# Patient Record
Sex: Male | Born: 1935 | Race: White | Hispanic: No | Marital: Married | Smoking: Never smoker
Health system: Southern US, Community
[De-identification: ages and names within clinical notes are randomized; demographics above are authoritative.]

## PROBLEM LIST (undated history)

## (undated) DIAGNOSIS — I10 Essential (primary) hypertension: Secondary | ICD-10-CM

## (undated) DIAGNOSIS — J449 Chronic obstructive pulmonary disease, unspecified: Secondary | ICD-10-CM

## (undated) DIAGNOSIS — G4733 Obstructive sleep apnea (adult) (pediatric): Secondary | ICD-10-CM

## (undated) DIAGNOSIS — I4891 Unspecified atrial fibrillation: Secondary | ICD-10-CM

## (undated) DIAGNOSIS — I2510 Atherosclerotic heart disease of native coronary artery without angina pectoris: Secondary | ICD-10-CM

## (undated) DIAGNOSIS — F32A Depression, unspecified: Secondary | ICD-10-CM

## (undated) DIAGNOSIS — D509 Iron deficiency anemia, unspecified: Secondary | ICD-10-CM

## (undated) DIAGNOSIS — I480 Paroxysmal atrial fibrillation: Secondary | ICD-10-CM

## (undated) DIAGNOSIS — I499 Cardiac arrhythmia, unspecified: Secondary | ICD-10-CM

## (undated) DIAGNOSIS — I714 Abdominal aortic aneurysm, without rupture, unspecified: Secondary | ICD-10-CM

## (undated) DIAGNOSIS — N400 Enlarged prostate without lower urinary tract symptoms: Secondary | ICD-10-CM

## (undated) DIAGNOSIS — I493 Ventricular premature depolarization: Secondary | ICD-10-CM

## (undated) DIAGNOSIS — F329 Major depressive disorder, single episode, unspecified: Secondary | ICD-10-CM

## (undated) DIAGNOSIS — M1990 Unspecified osteoarthritis, unspecified site: Secondary | ICD-10-CM

## (undated) DIAGNOSIS — K219 Gastro-esophageal reflux disease without esophagitis: Secondary | ICD-10-CM

## (undated) DIAGNOSIS — J986 Disorders of diaphragm: Secondary | ICD-10-CM

## (undated) DIAGNOSIS — E669 Obesity, unspecified: Secondary | ICD-10-CM

## (undated) DIAGNOSIS — R3129 Other microscopic hematuria: Secondary | ICD-10-CM

## (undated) DIAGNOSIS — E7800 Pure hypercholesterolemia, unspecified: Secondary | ICD-10-CM

## (undated) DIAGNOSIS — R0600 Dyspnea, unspecified: Secondary | ICD-10-CM

## (undated) HISTORY — DX: Abdominal aortic aneurysm, without rupture, unspecified: I71.40

## (undated) HISTORY — DX: Disorders of diaphragm: J98.6

## (undated) HISTORY — DX: Obstructive sleep apnea (adult) (pediatric): G47.33

## (undated) HISTORY — DX: Other microscopic hematuria: R31.29

## (undated) HISTORY — DX: Obesity, unspecified: E66.9

## (undated) HISTORY — DX: Benign prostatic hyperplasia without lower urinary tract symptoms: N40.0

## (undated) HISTORY — DX: Essential (primary) hypertension: I10

## (undated) HISTORY — DX: Pure hypercholesterolemia, unspecified: E78.00

## (undated) HISTORY — DX: Paroxysmal atrial fibrillation: I48.0

## (undated) HISTORY — PX: COLONOSCOPY W/ POLYPECTOMY: SHX1380

## (undated) HISTORY — DX: Dyspnea, unspecified: R06.00

## (undated) HISTORY — PX: EYE SURGERY: SHX253

## (undated) HISTORY — DX: Ventricular premature depolarization: I49.3

## (undated) HISTORY — DX: Abdominal aortic aneurysm, without rupture: I71.4

---
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
## 1976-09-09 HISTORY — PX: APPENDECTOMY: SHX54

## 1988-09-09 HISTORY — PX: CHOLECYSTECTOMY: SHX55

## 2000-07-09 ENCOUNTER — Ambulatory Visit (HOSPITAL_COMMUNITY): Admission: RE | Admit: 2000-07-09 | Discharge: 2000-07-09 | Payer: Self-pay | Admitting: Internal Medicine

## 2000-08-08 ENCOUNTER — Encounter (INDEPENDENT_AMBULATORY_CARE_PROVIDER_SITE_OTHER): Payer: Self-pay | Admitting: Specialist

## 2000-08-08 ENCOUNTER — Ambulatory Visit (HOSPITAL_COMMUNITY): Admission: RE | Admit: 2000-08-08 | Discharge: 2000-08-08 | Payer: Self-pay | Admitting: Gastroenterology

## 2001-11-03 ENCOUNTER — Encounter: Admission: RE | Admit: 2001-11-03 | Discharge: 2001-11-03 | Payer: Self-pay | Admitting: Internal Medicine

## 2001-11-03 ENCOUNTER — Encounter (HOSPITAL_BASED_OUTPATIENT_CLINIC_OR_DEPARTMENT_OTHER): Payer: Self-pay | Admitting: Internal Medicine

## 2001-12-03 ENCOUNTER — Encounter: Admission: RE | Admit: 2001-12-03 | Discharge: 2001-12-03 | Payer: Self-pay | Admitting: Otolaryngology

## 2001-12-03 ENCOUNTER — Encounter: Payer: Self-pay | Admitting: Otolaryngology

## 2001-12-15 ENCOUNTER — Encounter (HOSPITAL_BASED_OUTPATIENT_CLINIC_OR_DEPARTMENT_OTHER): Payer: Self-pay | Admitting: Internal Medicine

## 2001-12-15 ENCOUNTER — Encounter: Admission: RE | Admit: 2001-12-15 | Discharge: 2001-12-15 | Payer: Self-pay | Admitting: Internal Medicine

## 2003-07-19 ENCOUNTER — Ambulatory Visit (HOSPITAL_COMMUNITY): Admission: RE | Admit: 2003-07-19 | Discharge: 2003-07-19 | Payer: Self-pay | Admitting: Gastroenterology

## 2003-07-19 ENCOUNTER — Encounter (INDEPENDENT_AMBULATORY_CARE_PROVIDER_SITE_OTHER): Payer: Self-pay

## 2004-03-15 ENCOUNTER — Emergency Department (HOSPITAL_COMMUNITY): Admission: EM | Admit: 2004-03-15 | Discharge: 2004-03-15 | Payer: Self-pay | Admitting: Emergency Medicine

## 2004-07-20 ENCOUNTER — Ambulatory Visit (HOSPITAL_COMMUNITY): Admission: RE | Admit: 2004-07-20 | Discharge: 2004-07-20 | Payer: Self-pay | Admitting: Urology

## 2005-10-11 ENCOUNTER — Encounter: Admission: RE | Admit: 2005-10-11 | Discharge: 2005-10-11 | Payer: Self-pay | Admitting: Internal Medicine

## 2007-10-22 ENCOUNTER — Ambulatory Visit: Admission: RE | Admit: 2007-10-22 | Discharge: 2007-10-22 | Payer: Self-pay | Admitting: Specialist

## 2007-10-22 ENCOUNTER — Ambulatory Visit: Payer: Self-pay | Admitting: Vascular Surgery

## 2007-10-22 ENCOUNTER — Encounter (INDEPENDENT_AMBULATORY_CARE_PROVIDER_SITE_OTHER): Payer: Self-pay | Admitting: Specialist

## 2007-11-25 ENCOUNTER — Encounter: Admission: RE | Admit: 2007-11-25 | Discharge: 2007-11-25 | Payer: Self-pay | Admitting: Specialist

## 2010-03-07 ENCOUNTER — Encounter: Payer: Self-pay | Admitting: Pulmonary Disease

## 2010-03-09 ENCOUNTER — Encounter: Payer: Self-pay | Admitting: Internal Medicine

## 2010-03-09 ENCOUNTER — Encounter: Payer: Self-pay | Admitting: Cardiology

## 2010-03-09 HISTORY — PX: US ECHOCARDIOGRAPHY: HXRAD669

## 2010-03-15 ENCOUNTER — Encounter: Payer: Self-pay | Admitting: Pulmonary Disease

## 2010-03-15 ENCOUNTER — Encounter: Payer: Self-pay | Admitting: Internal Medicine

## 2010-03-19 ENCOUNTER — Encounter: Payer: Self-pay | Admitting: Cardiology

## 2010-03-19 ENCOUNTER — Encounter: Payer: Self-pay | Admitting: Internal Medicine

## 2010-03-19 HISTORY — PX: CARDIOVASCULAR STRESS TEST: SHX262

## 2010-04-26 ENCOUNTER — Ambulatory Visit: Payer: Self-pay | Admitting: Cardiology

## 2010-04-26 ENCOUNTER — Encounter: Admission: RE | Admit: 2010-04-26 | Discharge: 2010-04-26 | Payer: Self-pay | Admitting: Cardiology

## 2010-04-30 ENCOUNTER — Inpatient Hospital Stay (HOSPITAL_BASED_OUTPATIENT_CLINIC_OR_DEPARTMENT_OTHER): Admission: RE | Admit: 2010-04-30 | Discharge: 2010-04-30 | Payer: Self-pay | Admitting: Cardiology

## 2010-04-30 ENCOUNTER — Ambulatory Visit: Payer: Self-pay | Admitting: Cardiology

## 2010-04-30 HISTORY — PX: CARDIAC CATHETERIZATION: SHX172

## 2010-05-01 ENCOUNTER — Encounter: Payer: Self-pay | Admitting: Internal Medicine

## 2010-05-01 ENCOUNTER — Ambulatory Visit: Payer: Self-pay | Admitting: Cardiology

## 2010-05-09 DIAGNOSIS — M109 Gout, unspecified: Secondary | ICD-10-CM | POA: Insufficient documentation

## 2010-05-09 DIAGNOSIS — I4729 Other ventricular tachycardia: Secondary | ICD-10-CM | POA: Insufficient documentation

## 2010-05-09 DIAGNOSIS — E669 Obesity, unspecified: Secondary | ICD-10-CM | POA: Insufficient documentation

## 2010-05-09 DIAGNOSIS — E7800 Pure hypercholesterolemia, unspecified: Secondary | ICD-10-CM | POA: Insufficient documentation

## 2010-05-09 DIAGNOSIS — G4733 Obstructive sleep apnea (adult) (pediatric): Secondary | ICD-10-CM | POA: Insufficient documentation

## 2010-05-09 DIAGNOSIS — I472 Ventricular tachycardia: Secondary | ICD-10-CM | POA: Insufficient documentation

## 2010-05-09 DIAGNOSIS — I10 Essential (primary) hypertension: Secondary | ICD-10-CM | POA: Insufficient documentation

## 2010-05-10 ENCOUNTER — Ambulatory Visit: Payer: Self-pay | Admitting: Pulmonary Disease

## 2010-05-10 DIAGNOSIS — G2589 Other specified extrapyramidal and movement disorders: Secondary | ICD-10-CM | POA: Insufficient documentation

## 2010-05-10 DIAGNOSIS — G4700 Insomnia, unspecified: Secondary | ICD-10-CM | POA: Insufficient documentation

## 2010-05-11 ENCOUNTER — Encounter (INDEPENDENT_AMBULATORY_CARE_PROVIDER_SITE_OTHER): Payer: Self-pay | Admitting: *Deleted

## 2010-05-11 ENCOUNTER — Ambulatory Visit: Payer: Self-pay | Admitting: Internal Medicine

## 2010-05-15 ENCOUNTER — Encounter: Payer: Self-pay | Admitting: Internal Medicine

## 2010-05-21 ENCOUNTER — Telehealth (INDEPENDENT_AMBULATORY_CARE_PROVIDER_SITE_OTHER): Payer: Self-pay | Admitting: *Deleted

## 2010-05-22 ENCOUNTER — Ambulatory Visit: Payer: Self-pay | Admitting: Internal Medicine

## 2010-05-22 LAB — CONVERTED CEMR LAB
BUN: 22 mg/dL (ref 6–23)
Basophils Absolute: 0 10*3/uL (ref 0.0–0.1)
Basophils Relative: 0.5 % (ref 0.0–3.0)
CO2: 30 meq/L (ref 19–32)
Calcium: 9 mg/dL (ref 8.4–10.5)
Chloride: 105 meq/L (ref 96–112)
Creatinine, Ser: 0.9 mg/dL (ref 0.4–1.5)
Eosinophils Absolute: 0 10*3/uL (ref 0.0–0.7)
Eosinophils Relative: 0.6 % (ref 0.0–5.0)
GFR calc non Af Amer: 91.06 mL/min (ref >60)
Glucose, Bld: 87 mg/dL (ref 70–99)
HCT: 40 % (ref 39.0–52.0)
Hemoglobin: 13.8 g/dL (ref 13.0–17.0)
INR: 1 (ref 0.8–1.0)
Lymphocytes Relative: 21.3 % (ref 12.0–46.0)
Lymphs Abs: 1.4 10*3/uL (ref 0.7–4.0)
MCHC: 34.4 g/dL (ref 30.0–36.0)
MCV: 95.4 fL (ref 78.0–100.0)
Monocytes Absolute: 0.5 10*3/uL (ref 0.1–1.0)
Monocytes Relative: 8.1 % (ref 3.0–12.0)
Neutro Abs: 4.7 10*3/uL (ref 1.4–7.7)
Neutrophils Relative %: 69.5 % (ref 43.0–77.0)
Platelets: 134 10*3/uL — ABNORMAL LOW (ref 150.0–400.0)
Potassium: 4.5 meq/L (ref 3.5–5.1)
Prothrombin Time: 11 s (ref 9.7–11.8)
RBC: 4.19 M/uL — ABNORMAL LOW (ref 4.22–5.81)
RDW: 14.3 % (ref 11.5–14.6)
Sodium: 143 meq/L (ref 135–145)
WBC: 6.7 10*3/uL (ref 4.5–10.5)
aPTT: 29.4 s — ABNORMAL HIGH (ref 21.7–28.8)

## 2010-05-29 ENCOUNTER — Ambulatory Visit (HOSPITAL_COMMUNITY): Admission: RE | Admit: 2010-05-29 | Discharge: 2010-05-30 | Payer: Self-pay | Admitting: Internal Medicine

## 2010-05-29 ENCOUNTER — Ambulatory Visit: Payer: Self-pay | Admitting: Internal Medicine

## 2010-05-29 HISTORY — PX: OTHER SURGICAL HISTORY: SHX169

## 2010-06-04 ENCOUNTER — Ambulatory Visit: Payer: Self-pay | Admitting: Cardiology

## 2010-06-04 ENCOUNTER — Encounter: Payer: Self-pay | Admitting: Internal Medicine

## 2010-06-13 ENCOUNTER — Telehealth: Payer: Self-pay | Admitting: Pulmonary Disease

## 2010-06-15 ENCOUNTER — Ambulatory Visit: Payer: Self-pay | Admitting: Cardiology

## 2010-06-27 ENCOUNTER — Ambulatory Visit: Payer: Self-pay | Admitting: Pulmonary Disease

## 2010-06-27 DIAGNOSIS — J986 Disorders of diaphragm: Secondary | ICD-10-CM | POA: Insufficient documentation

## 2010-07-02 ENCOUNTER — Ambulatory Visit: Payer: Self-pay | Admitting: Internal Medicine

## 2010-07-03 ENCOUNTER — Ambulatory Visit (HOSPITAL_COMMUNITY): Admission: RE | Admit: 2010-07-03 | Discharge: 2010-07-03 | Payer: Self-pay | Admitting: Pulmonary Disease

## 2010-07-03 ENCOUNTER — Encounter: Payer: Self-pay | Admitting: Pulmonary Disease

## 2010-08-09 ENCOUNTER — Ambulatory Visit: Payer: Self-pay | Admitting: Cardiology

## 2010-09-20 ENCOUNTER — Ambulatory Visit (HOSPITAL_COMMUNITY)
Admission: RE | Admit: 2010-09-20 | Discharge: 2010-09-20 | Payer: Self-pay | Source: Home / Self Care | Attending: Orthopaedic Surgery | Admitting: Orthopaedic Surgery

## 2010-10-08 ENCOUNTER — Ambulatory Visit
Admission: RE | Admit: 2010-10-08 | Discharge: 2010-10-08 | Payer: Self-pay | Source: Home / Self Care | Attending: Internal Medicine | Admitting: Internal Medicine

## 2010-10-09 NOTE — Progress Notes (Signed)
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 pt wants to know if this could be related to his sleep apnea. cell [id number] Initial call taken by: Tivis Ringer, CNA,  June 13, 2010 9:16 AM  Follow-up for Phone Call        called and spoke with pt.  pt was last seen by [facility] 05-10-2010.  Pt was told to not use bipap and given a trial of Ambien.  Per phone note from 05-21-2010, pt couldn't sleep with Ambien and therefore was given a trial of Requip.  Pt states he couldn't sleep with the Requip and has since went back to using his bipap machine.  Pt states Dr. 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  Thanks.  Aundra Millet Reynolds LPN  June 13, 2010 9:58 AM   Additional Follow-up for Phone Call Additional follow up Details #1::        It can definitely affect breathing if you try and lay down flat, but should not be as much of an issue when he tries to sleep more upright.  most patients who have a paralyzed diaphragm compensate over time and do not have ongoing issues. see if he can come in to discuss further and see if this may change trying to get him bipap.   Additional Follow-up by: Barbaraann Share MD,  June 13, 2010 12:40 PM    Additional Follow-up for Phone Call Additional follow up Details #2::    pt advised and set to see [facility] 06-14-10 at 12pm. Carron Curie CMA  June 13, 2010 1:03 PM

## 2010-10-09 NOTE — Progress Notes (Signed)
Summary: unable to sleep without bipap < try requip  Phone Note Call from Patient Call back at Home Phone [phone]   Caller: Patient Call For: clance Summary of Call: FYI: Pt states he can't sleep without his bipap machine, therefore he won't be able to do the home sleep study. Initial call taken by: Darletta Moll,  May 21, 2010 4:32 PM  Follow-up for Phone Call        At last OV on 9.1.11, pt was instructed to sleep on at least 2 pillows and more upright to help with breathing and call in one week to see if he was able to sleep without bipap.  If so, home sleep study will be done.    Called, spoke with pt.  Pt states despite trying ambien and using 2 pillows he is still unable to sleep without bipap.  Will forward to [facility] to address.   Follow-up by: Gweneth Dimitri RN,  May 21, 2010 4:41 PM  Additional Follow-up for Phone Call Additional follow up Details #1::        I am unable to get bipap paid for by insurance, nor am I comfortable ordering bipap without documentation of sleep apnea.   He had a lot of leg jerks on his sleep study, perhaps this is interfering with his ability to sleep.  See if he is willing to try requip 0.5mg  after dinner each night to see if he can possibly sleep better enough to do the home study. Additional Follow-up by: Barbaraann Share MD,  May 21, 2010 4:59 PM    Additional Follow-up for Phone Call Additional follow up Details #2::    Called, spoke with pt.  He was informed of above per [facility].  He is willing to try requip.  Dr. Shelle Iron, pls advise on quanity.  Thanks! [facility].   Gweneth Dimitri RN  May 21, 2010 5:13 PM   Additional Follow-up for Phone Call Additional follow up Details #3:: Details for Additional Follow-up Action Taken: #30, no fills.  Rx sent to [facility] -- pt aware.  Gweneth Dimitri RN  May 21, 2010 5:27 PM  Additional Follow-up by: Barbaraann Share MD,  May 21, 2010 5:22 PM  New/Updated  Medications: REQUIP 0.5 MG TABS (ROPINIROLE HCL) take 1 tablet after dinner each night Prescriptions: REQUIP 0.5 MG TABS (ROPINIROLE HCL) take 1 tablet after dinner each night  #30 x 0   Entered by:   Gweneth Dimitri RN   Authorized by:   Barbaraann Share MD   Signed by:   Gweneth Dimitri RN on 05/21/2010   Method used:   [facility] to        [facility]  [phone]* (retail)       [address] [place], [place]  [address]       Ph: [phone] or [phone]       Fax: [phone]   RxID:   [id number]

## 2010-10-09 NOTE — Assessment & Plan Note (Signed)
Summary: [name] to discuss orthopnea and ?diaphragm abnl.   Visit Type:  Follow-up Copy to:  Dr Deborah Chalk Primary Essynce Munsch/Referring Jodine Muchmore:  Dr Brunilda Payor  CC:  follow up. pt states he is here to discuss his paralized diaphram and wants to know if this affects his sleep. Pt states he uses vpap everynight x 6-8 hrs a night. .  History of Present Illness: the pt comes in today for f/u of his sleeping difficulties and orthopnea.  We have been unable to verify that he has osa, and therefore have not been able to get insurance to qualify him for a new cpap machine.  He recently underwent RFA for frequent PVC's, and the pt was told he may have a paralyzed hemidiaphragm.  I have reviewed his cxr's, and he does have a chronically elevated left diaphragm, but also abdominal contents pressing upward in this area.  He has never had a sniff test.  The pt is questioning how this may play a role in his nighttime symptoms of orthopnea.  Current Medications (verified): 1)  Allopurinol 300 Mg Tabs (Allopurinol) .... Take 1 Tablet By Mouth Once A Day 2)  Felodipine 5 Mg Xr24h-Tab (Felodipine) .... Take 1 Tablet By Mouth Once A Day 3)  Lisinopril 40 Mg Tabs (Lisinopril) .... Take 1 Tablet By Mouth Once A Day 4)  Doxazosin Mesylate 2 Mg Tabs (Doxazosin Mesylate) .... Take 1 Tablet By Mouth Once A Day 5)  Trazodone Hcl 100 Mg Tabs (Trazodone Hcl) .... Take 1 Tablet By Mouth Once A Day 6)  Lovastatin 40 Mg Tabs (Lovastatin) .... Take One Daily 7)  Advil 200 Mg Tabs (Ibuprofen) .Marland Kitchen.. 1 Tablet Two Times A Day 8)  Aspirin 81 Mg Tabs (Aspirin) .... Once Daily  Allergies (verified): No Known Drug Allergies  Review of Systems       The patient complains of shortness of breath with activity, nasal congestion/difficulty breathing through nose, hand/feet swelling, and joint stiffness or pain.  The patient denies shortness of breath at rest, productive cough, non-productive cough, coughing up blood, chest pain, irregular  heartbeats, acid heartburn, indigestion, loss of appetite, weight change, abdominal pain, difficulty swallowing, sore throat, tooth/dental problems, headaches, sneezing, itching, ear ache, anxiety, depression, rash, change in color of mucus, and fever.    Vital Signs:  Patient profile:   77 year old male Height:      70 inches Weight:      212.38 pounds BMI:     30.58 O2 Sat:      93 % on Room air Temp:     98.4 degrees F oral Pulse rate:   69 / minute BP sitting:   140 / 70  (left arm) Cuff size:   large  Vitals Entered By: Carver Fila (June 27, 2010 3:38 PM)  O2 Flow:  Room air CC: follow up. pt states he is here to discuss his paralized diaphram and wants to know if this affects his sleep. Pt states he uses vpap everynight x 6-8 hrs a night.  Comments meds and allergies updated Phone number updated Carver Fila  June 27, 2010 3:39 PM    Physical Exam  General:  mild ow male in nad   Impression & Recommendations:  Problem # 1:  DIAPHRAGMATIC DISORDER (ICD-519.4) the pt has a very elevated left hemidiaphragm chronically, but it is unclear if this is indeed paralyzed.  He will need a "sniff test" with fluoroscopy to put this issue to rest.  The pt is asking whether  this could cause his symptoms of sob while lying flat.  I think it is possible, but not likely.  Most pts have issues after the initial insult, but this typically resolves unless they have bilateral involvement.  Regarding his cpap device, we could possibly get this approved if he does have diaphragm dysfunction AND has CO2 retention > or = to 50.  This would qualify for a "respiratory assist device".  After a long discussion, the pt would like to pursue.  Will check fluoro of HD, and will check am abg to evaluate for elevated carbon dioxide.  Time spent with pt and family member today discussing the above was  Medications Added to Medication List This Visit: 1)  Advil 200 Mg Tabs (Ibuprofen) .Marland Kitchen.. 1 tablet two  times a day 2)  Aspirin 81 Mg Tabs (Aspirin) .... Once daily  Other Orders: Est. Patient Level III (14782) Pulmonary Referral (Pulmonary) Radiology Referral (Radiology)  Patient Instructions: 1)  will check diaphragm function with xray, as well as your blood carbon dioxide level.  Will contact you for further plans once results available.    Immunization History:  Influenza Immunization History:    Influenza:  historical (05/10/2010)  Pneumovax Immunization History:    Pneumovax:  historical (05/10/2009)

## 2010-10-09 NOTE — Letter (Signed)
Summary: ELectrophysiology/Ablation Procedure Instructions  [facility], Main Office  [address]   [place], [place] [address]   Phone: [phone]  Fax: [phone]     Electrophysiology/Ablation Procedure Instructions    You are scheduled for a(n) __VT Ablation ________ on _ September 20th __ at _7:30 am __________ with Dr. Johney Frame ____________.  1.  Please come to the Short Stay Center at [facility] at _6:00_am_____ on the day of your procedure.  2.  Come prepared to stay overnight.   Please bring your insurance cards and a list of your medications.  3.  Come to the [facility] office on _Sept 13th______ for lab work.  The lab at [facility] is open from 8:30 AM to 1:30 PM and 2:30 PM to 5:00 PM.  The lab at [facility] is open from 7:30 AM to 5:30 PM.  You do not have to be fasting.  4.  Do not have anything to eat or drink after midnight the night before your procedure.  5.  Do NOT take the Toprol night before  your procedure and the morning of procedure.  All of your remaining medications may be taken with a small amount of water.  6.  Educational material received:  _____ EP   __X___ Ablation   * Occasionally, EP studies and ablations can become lengthy.  Please make your family aware of this before your procedure starts.  Average time ranges from 2-8 hours for EP studies/ablations.  Your physician will locate your family after the procedure with the results.  * If you have any questions after you get home, please call the office at [phone].

## 2010-10-09 NOTE — Assessment & Plan Note (Signed)
Summary: consult for osa, sleep disruption.   Copy to:  [facility] Primary Skarlette Lattner/Referring Alexzia Kasler:  [name]  CC:  Sleep Consult.  History of Present Illness: The pt is a 76y/o male who I have been asked to see for management of multiple sleep issues.  The pt was diagnosed with OSA 42yrs ago, and was told it was severe.  He was placed on a bipap device, and did well with tolerance and symptom relief.  His old machine has since stopped working, and he could not get a new machine paid for without recertification of his SDB.  He recently underwent a sleep study 03/07/10 which showed no apneas or hypopneas, minimal desat, but did show large numbers of PLMS with signficant arousal.  However, the pt had no REM or slow wave sleep, and only of TST.  The pt states that he cannot fall asleep without his bipap machine.  He feels that he can't breathe if he lies down on his back or side.  He has been evaluated with an echo, with nothing to suggest LV dysfunction that could lead to orthopnea.  He typically goes to bed 10pm, and arises at 7am to start his day.  He feels that he sleeps well if he is able to wear a functioning bipap machine.  He denies any significant daytime sleepiness, and his epworth score today is only 3.  The pt is unsure if he kicks during the night, and denies true RLS symptoms.  He has had no major weight change from his original sleep study 20 yrs ago.    Current Medications (verified): 1)  Toprol Xl 25 Mg Xr24h-Tab (Metoprolol Succinate) .... Take 1 Tablet By Mouth Two Times A Day 2)  Allopurinol 300 Mg Tabs (Allopurinol) .... Take 1 Tablet By Mouth Once A Day 3)  Felodipine 5 Mg Xr24h-Tab (Felodipine) .... Take 1 Tablet By Mouth Once A Day 4)  Lisinopril 40 Mg Tabs (Lisinopril) .... Take 1 Tablet By Mouth Once A Day 5)  Doxazosin Mesylate 2 Mg Tabs (Doxazosin Mesylate) .... Take 1 Tablet By Mouth Once A Day 6)  Trazodone Hcl 100 Mg Tabs (Trazodone Hcl) .... Take 1  Tablet By Mouth Once A Day 7)  Losartan Potassium 50 Mg Tabs (Losartan Potassium) .... Take 1 Tablet By Mouth Once A Day  Allergies (verified): No Known Drug Allergies  Past History:  Past Medical History:  HYPERCHOLESTEROLEMIA (ICD-272.0) OBSTRUCTIVE SLEEP APNEA (ICD-327.23) OBESITY (ICD-278.00) GOUT (ICD-274.9) VENTRICULAR TACHYCARDIA (ICD-427.1) HYPERTENSION (ICD-401.9)    Past Surgical History: Reviewed history from 05/09/2010 and no changes required. cholecystectomy 1990 appendectomy 1978 polyp removed from nose as a child  Family History: Reviewed history from 05/09/2010 and no changes required. father deceased at age 34 of brain aneurysm.  mother is alive at age 38 but has a hx of breast ca. one daughter had breast cancer. pt has no siblings.   Social History: retired since 2001.  prev managed a billing supply business. pt is married. and lives with wife, Sedalia Muta. pt has children. pt stopped smoking approx 1986.  smoked 4 ppd  starting at age early 44s.   Review of Systems       The patient complains of shortness of breath with activity, irregular heartbeats, hand/feet swelling, and joint stiffness or pain.  The patient denies shortness of breath at rest, productive cough, non-productive cough, coughing up blood, chest pain, acid heartburn, indigestion, loss of appetite, weight change, abdominal pain, difficulty swallowing, sore throat, tooth/dental problems, headaches,  nasal congestion/difficulty breathing through nose, sneezing, itching, ear ache, anxiety, depression, rash, change in color of mucus, and fever.    Vital Signs:  Patient profile:   77 year old male Height:      70 inches Weight:      215.38 pounds BMI:     31.02 O2 Sat:      93 % on Room air Temp:     98.2 degrees F oral Pulse rate:   44 / minute BP sitting:   122 / 72  (right arm) Cuff size:   large  Vitals Entered By: Arman Filter LPN (May 10, 2010 10:45 AM)  O2 Flow:  Room  air CC: Sleep Consult Comments Medications reviewed with patient Arman Filter LPN  May 10, 2010 10:45 AM    Physical Exam  General:  ow male in nad Eyes:  PERRLA and EOMI.   Nose:  deviated septum to left with narrowing Mouth:  mild elongation of soft palate and uvula Neck:  no jvd, tmg, LN Lungs:  clear to auscultation. Heart:  bradycardic but regular rhythm Abdomen:  soft and nontender, bs+ Extremities:  minimal edema, no cyanosis pulses intact distally Neurologic:  alert and oriented, moves all 4.   Impression & Recommendations:  Problem # 1:  OBSTRUCTIVE SLEEP APNEA (ICD-327.23) the pt has a presumed h/o severe osa 13yrs ago, for which he has been on bipap.  His recent sleep study showed no sleep disordered breathing, however, he had a short TST and no deep sleep.  It is unclear if he really has sleep apnea, but will retest him in his home environment where he may have a better chance of achieving REM/SWS.  I would find it very unlikely that he no longer has osa, if he truly had severe osa 55yrs ago and no large weight change.  Problem # 2:  PERSISTENT DISORDER INITIATING/MAINTAINING SLEEP (ICD-307.42) I am wondering how much of this may be due to anxiety of not wearing his bipap device.  I will try him short term on a sedative hypnotic to see if this helps.  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  His sleep study shows large numbers of leg jerks, but he denies this being an issue, and has no symptoms of RLS.  If his home study fails to show OSA, I would consider treating him with a dopamine agonist as a trial to see if things improve.  Medications Added to Medication List This Visit: 1)  Allopurinol 300 Mg Tabs (Allopurinol) .... Take 1 tablet by mouth once a day 2)  Felodipine 5 Mg Xr24h-tab (Felodipine) .... Take 1 tablet by mouth once a day 3)  Lisinopril 40 Mg Tabs (Lisinopril)  .... Take 1 tablet by mouth once a day 4)  Doxazosin Mesylate 2 Mg Tabs (Doxazosin mesylate) .... Take 1 tablet by mouth once a day 5)  Trazodone Hcl 100 Mg Tabs (Trazodone hcl) .... Take 1 tablet by mouth once a day 6)  Losartan Potassium 50 Mg Tabs (Losartan potassium) .... Take 1 tablet by mouth once a day  Other Orders: Consultation Level V (19147)  Patient Instructions: 1)  will try ambien cr 6.25mg  at bedtime to help with sleep onset.  Please sleep on at least 2 pillows and more upright to help with breathing.  Please call me in one week to see if you are able to sleep during the  night without a bipap machine.  If you are, will do home screening sleep study off bipap to see if you really have sleep apnea. 2)  stop neosynephrine nasal spray...use veramyst nasal spray  2 each nostril each am.

## 2010-10-09 NOTE — Letter (Signed)
Summary: [facility] Cardiology Progress Note   [facility] Cardiology Progress Note   Imported By: Roderic Ovens 05/23/2010 15:45:43  _____________________________________________________________________  External Attachment:    Type:   Image     Comment:   External Document

## 2010-10-09 NOTE — Assessment & Plan Note (Signed)
Summary: NEP/VTACH/JML   Visit Type:  Initial Consult Referring Provider:  Dr Deborah Chalk Primary Provider:  Dr Brunilda Payor  CC:  irregular heart beat.  History of Present Illness: [name] is a pleasant 77 yo WM with symptomatic PVCs who presents today for EP consultation.  He reports symptoms of fatigue and decreased exercise tolerance over the past 3-4 months.  He also reports sypmtoms dypsnea, predominantly when supine.  He underwent a sleep study and during the study was noted to have PVCs.  He then worse a holter monitor which documented 20% PVCs. The patient denies symptoms of palpitations, chest pain, lower extremity edema, presyncope, syncope, or neurologic sequela.  He reports SOB with moderate activity.  He also reports occasional lightheadedness, worse when in the hot sun. The patient is tolerating medications without difficulties and is otherwise without complaint today.   Current Medications (verified): 1)  Toprol Xl 25 Mg Xr24h-Tab (Metoprolol Succinate) .... Take 1 Tablet By Mouth Two Times A Day 2)  Allopurinol 300 Mg Tabs (Allopurinol) .... Take 1 Tablet By Mouth Once A Day 3)  Felodipine 5 Mg Xr24h-Tab (Felodipine) .... Take 1 Tablet By Mouth Once A Day 4)  Lisinopril 40 Mg Tabs (Lisinopril) .... Take 1 Tablet By Mouth Once A Day 5)  Doxazosin Mesylate 2 Mg Tabs (Doxazosin Mesylate) .... Take 1 Tablet By Mouth Once A Day 6)  Trazodone Hcl 100 Mg Tabs (Trazodone Hcl) .... Take 1 Tablet By Mouth Once A Day 7)  Lovastatin 40 Mg Tabs (Lovastatin) .... 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 cholecystectomy 1990 appendectomy 1978 polyp removed from nose as a child  Family History: father deceased at age 9 of brain aneurysm.  mother is alive at age 22 but has a hx of breast ca. one daughter had breast cancer. pt has no siblings.    no FH of sudden death or arrhythmias  Social History: Lives in [place] with spouse. retired since 2001.  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 pt stopped smoking approx 1986.  smoked 3 ppd x 20 years. Previously drank 4 oz of scotch per night, none for several years.  Review of Systems       All systems are reviewed and negative except as listed in the HPI.   Vital Signs:  Patient profile:   77 year old male Height:      70 inches Weight:      213 pounds BMI:     30.67 Pulse rate:   64 / minute BP sitting:   137 / 74  (left arm)  Vitals Entered By: Jacquelin Hawking, CMA (May 11, 2010 10:18 AM)  Physical Exam  General:  Well developed, well nourished, in no acute distress. Head:  normocephalic and atraumatic Eyes:  PERRLA/EOM intact; conjunctiva and lids normal. Mouth:  Teeth, gums and palate normal. Oral mucosa normal. Neck:  Neck supple, no JVD. No masses, thyromegaly or abnormal cervical nodes. Lungs:  Clear bilaterally to auscultation and percussion. Heart:  RRR with very frequent ectopy Abdomen:  Bowel sounds positive; abdomen soft and non-tender without masses, organomegaly, or hernias noted. No hepatosplenomegaly. Msk:  Back normal, normal gait.  Muscle strength and tone normal. Pulses:  pulses normal in all 4 extremities Extremities:  No clubbing or cyanosis. Neurologic:  Alert and oriented x 3. Skin:  Intact without lesions or rashes. Cervical Nodes:  no significant adenopathy Psych:  Normal affect.    Cardiac Cath  Procedure date:  05/01/2010  Findings:       OVERALL IMPRESSION:   1. Essentially normal left ventricular systolic function with mildly        elevated end-diastolic pressures, ventricular ectopy during the       initial phases of the ventriculogram.   2. Mild, somewhat diffuse three-vessel coronary ectasia but with no       stenotic coronary artery disease present.      DISCUSSION:  In light of these findings, it is felt that the primary   issue for [name] is that of his frequent and complex ventricular   ectopy.  We will place a Holter monitor to have a repeat evaluation of   the degree of ectopy that he is having.  If the ectopy returns as   complex as I expected to, he will be referred for EP evaluation and   hopefully ablation of the PVC focus.      CXR  Procedure date:  04/26/2010  Findings:        Findings: Heart size is normal.  The aorta is unfolded.  There is   chronic elevation of the left hemidiaphragm with gaseous distention   of the stomach.  There is mild volume loss at the left base related   to the elevated hemidiaphragm, chronic in nature.  No sign of   active infiltrate, mass, effusion or active collapse.  Ordinary   degenerative changes effect the spine.    IMPRESSION:   No active disease.  Chronic elevation of the left hemidiaphragm   with mild chronic volume loss at the left base.   Venous Doppler  Procedure date:  10/22/2007  Findings:       -  No obvious evidence of DVT, superficial thrombosis, or a Baker's         cyst noted in the right lower extremity.    Signed by Marrion Coy, CNA on 05/10/2010 at 11:18 AM  ________________________________________________________________________     Echocardiogram  Procedure date:  03/09/2010  Findings:      Ainus with PVCS Normal LV systolic function with mild LVH Impaired LV relazation Left atrial enlargement Mild aortic sclerosis  Nuclear Study  Procedure date:  03/19/2010  Findings:      Normal perfusion with no gating. PVC couplets and one 3 beat salvo present    EKG  Procedure date:  05/11/2010  Findings:       sinus rhythm with frequent PVCs at least 3 pvc morphologies are present (LBB inferior axis, RBB L superior axis, and RBB inferior axis).  The predominant of these three morphologies appears to be RBB, superior axis.  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  A recent event monitor has revealed 20% of beats are ectopic.  Today, pt has at least 3 different PVC morphologies on ekg.  He has failed medical therapy with beta blockers.  Therapeutic strategies for VT including medicine and ablation were discussed in detail with the patient today. Risk, benefits, and alternatives to EP study and radiofrequency ablation  were also discussed in detail today. These risks include but are not limited to stroke, bleeding,  vascular damage, tamponade, perforation, damage to the heart and other structures, AV block requiring pacemaker, worsening renal function, and death. The patient understands these risk and wishes to proceed.   We will schedule ablation at the next available time.  Problem # 2:  HYPERTENSION (ICD-401.9) stable The following medications were removed from the medication list:    Losartan Potassium 50 Mg Tabs (Losartan potassium) .Marland Kitchen... Take 1 tablet by mouth once a day His updated medication list for this problem includes:    Toprol Xl 25 Mg Xr24h-tab (Metoprolol succinate) .Marland Kitchen... Take 1 tablet by mouth two times a day    Felodipine 5 Mg Xr24h-tab (Felodipine) .Marland Kitchen... Take 1 tablet by mouth once a day    Lisinopril 40 Mg Tabs (Lisinopril) .Marland Kitchen... Take 1 tablet by mouth once a day    Doxazosin Mesylate 2 Mg Tabs (Doxazosin mesylate) .Marland Kitchen... Take 1 tablet by mouth once a day  Other Orders: EKG w/ Interpretation (93000)

## 2010-10-09 NOTE — Letter (Signed)
Summary: [facility] Cardiology Progress Note   [facility] Cardiology Progress Note   Imported By: Roderic Ovens 05/23/2010 15:47:15  _____________________________________________________________________  External Attachment:    Type:   Image     Comment:   External Document

## 2010-10-09 NOTE — Letter (Signed)
Summary: [facility] Cardiology [facility]   Imported By: Sherian Rein 05/24/2010 07:38:50  _____________________________________________________________________  External Attachment:    Type:   Image     Comment:   External Document

## 2010-10-09 NOTE — Procedures (Signed)
Summary: [facility]   [facility]   Imported By: Roderic Ovens 07/23/2010 17:16:07  _____________________________________________________________________  External Attachment:    Type:   Image     Comment:   External Document

## 2010-10-09 NOTE — Assessment & Plan Note (Signed)
Summary: eph. gd   Visit Type:  Follow-up Referring Provider:  Dr Deborah Chalk Primary Provider:  Dr Brunilda Payor   History of Present Illness: The patient presents today for routine electrophysiology followup. He reports doing very well since his recent ablation for PVCs and NSVT.  He feels that his energy has improved.  He continues to have SOB particularly when supine.  He is presently being evaluated by Dr Maple Hudson for this.  The patient denies symptoms of palpitations, chest pain,  lower extremity edema, dizziness, presyncope, syncope, or neurologic sequela. The patient is tolerating medications without difficulties and is otherwise without complaint today.   Current Medications (verified): 1)  Allopurinol 300 Mg Tabs (Allopurinol) .... Take 1 Tablet By Mouth Once A Day 2)  Felodipine 5 Mg Xr24h-Tab (Felodipine) .... Take 1 Tablet By Mouth Once A Day 3)  Lisinopril 40 Mg Tabs (Lisinopril) .... Take 1 Tablet By Mouth Once A Day 4)  Doxazosin Mesylate 2 Mg Tabs (Doxazosin Mesylate) .... Take 1 Tablet By Mouth Once A Day 5)  Trazodone Hcl 100 Mg Tabs (Trazodone Hcl) .... Take 1 Tablet By Mouth Once A Day 6)  Lovastatin 40 Mg Tabs (Lovastatin) .... Take One Daily 7)  Advil 200 Mg Tabs (Ibuprofen) .Marland Kitchen.. 1 Tablet Two Times A Day 8)  Aspirin 81 Mg Tabs (Aspirin) .... 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 Lives in [place] with spouse. retired since 2001.  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 pt stopped smoking approx 1986.  smoked 3 ppd x 20 years. Previously drank 4 oz of scotch per night, none for several years.  Review of Systems       All systems are reviewed and negative except as listed in the HPI.   Vital Signs:  Patient profile:   77 year old male Height:      70 inches Weight:      210 pounds BMI:     30.24 Pulse rate:   77 / minute BP sitting:   142 / 80  (left arm)  Vitals Entered By: Laurance Flatten CMA (July 02, 2010 4:46 PM)  Physical Exam  General:  Well developed, well nourished, in no acute distress. Head:  normocephalic and atraumatic Eyes:  PERRLA/EOM intact; conjunctiva and lids normal. Mouth:  Teeth, gums and palate normal. Oral mucosa normal. Neck:  Neck supple, no JVD. No masses, thyromegaly or abnormal cervical nodes. Lungs:  Clear bilaterally to auscultation and percussion. Heart:  RRR occasional ectopy, no m/r/g Abdomen:  Bowel sounds positive; abdomen soft and non-tender without masses, organomegaly, or hernias noted. No hepatosplenomegaly. Msk:  Back normal, normal gait. Muscle strength and tone normal. Pulses:  pulses normal in all 4 extremities Extremities:  No clubbing or cyanosis. Neurologic:  Alert and oriented x 3.   EKG  Procedure date:  07/02/2010  Findings:      sinus rhythm with occasional PVCS PVC morphology is RBB inferior axis  Impression & Recommendations:  Problem # 1:  VENTRICULAR TACHYCARDIA (ICD-427.1) The patient has a h/o symptomatic NSVT  and frequent PVCs.  He recently underwent PVC ablation which revealed that his dominant PVC/ NSVT focus was from the inferoseptal left ventricle approximately one-third between the base and apex.  Transient complete heart block was observed during ablation which resolved.   A subsequent 24 hour holter reveals 11,955 pvcs though he did not have NSVT.  This represents a reduction in PVC burden. At this point,  the patient feels better.  I would not recommend restarting toprol at this time. I will however stop felodipine and start verapamil 240mg  daily.  He will return for EP follow-up in 3 months.  Problem # 2:  HYPERCHOLESTEROLEMIA (ICD-272.0) stable His updated medication list for this problem includes:    Lovastatin 40 Mg Tabs (Lovastatin) .Marland Kitchen... Take one daily  Problem # 3:  HYPERTENSION (ICD-401.9) stable  Patient Instructions: 1)  Your physician recommends that you schedule a follow-up appointment in: 3 months with Dr Johney Frame 2)  Your physician has recommended you make the following change in your medication: stop Felodipine and stasrt Verapamil 240mg  daily Prescriptions: VERAPAMIL HCL CR 240 MG XR24H-CAP (VERAPAMIL HCL) one by mouth daily  #30 x 6   Entered by:   Dennis Bast, RN, BSN   Authorized by:   Hillis Range, MD   Signed by:   Dennis Bast, RN, BSN on 07/02/2010   Method used:   Electronically to        [facility]  [phone]* (retail)       [address] [place], [place]  [address]       Ph: [phone] or [phone]       Fax: [phone]   RxID:   [id number]

## 2010-10-17 NOTE — Assessment & Plan Note (Signed)
Summary: 3 MONTH ROV.SL   Visit Type:  Follow-up Referring Provider:  Dr Deborah Chalk Primary Provider:  Dr Brunilda Payor   History of Present Illness: The patient presents today for routine electrophysiology followup.  He feels that his energy and SOB have improved.  His primary concern today is back pain/ sciatica. The patient denies symptoms of palpitations, chest pain,  lower extremity edema, dizziness, presyncope, syncope, or neurologic sequela. The patient is tolerating medications without difficulties and is otherwise without complaint today.   Current Medications (verified): 1)  Allopurinol 300 Mg Tabs (Allopurinol) .... Take 1 Tablet By Mouth Once A Day 2)  Lisinopril 40 Mg Tabs (Lisinopril) .... Take 1 Tablet By Mouth Once A Day 3)  Doxazosin Mesylate 4 Mg Tabs (Doxazosin Mesylate) .... Once Daily 4)  Trazodone Hcl 100 Mg Tabs (Trazodone Hcl) .... Take 1 Tablet By Mouth Once A Day 5)  Lovastatin 40 Mg Tabs (Lovastatin) .... Take One Daily 6)  Advil 200 Mg Tabs (Ibuprofen) .Marland Kitchen.. 1 Tablet Two Times A Day 7)  Aspirin 81 Mg Tabs (Aspirin) .... Once Daily 8)  Verapamil Hcl Cr 240 Mg Xr24h-Cap (Verapamil Hcl) .... One By Mouth Daily 9)  Hydrocodone-Acetaminophen 5-325 Mg Tabs (Hydrocodone-Acetaminophen) .... 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 cholecystectomy 1990 appendectomy 1978 polyp removed from nose as a child PVC ablation 05/29/10  Social History: Reviewed history from 05/11/2010 and no changes required. Lives in [place] with  spouse. retired since 2001.  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 pt stopped smoking approx 1986.  smoked 3 ppd x 20 years. Previously drank 4 oz of scotch per night, none for several years.  Vital Signs:  Patient profile:   77 year old male Height:      70 inches Weight:      205 pounds BMI:     29.52 Pulse rate:   92 / minute BP sitting:   144 / 70  (left arm)  Vitals Entered By: Laurance Flatten CMA (October 08, 2010 9:39 AM)  Physical Exam  General:  Well developed, well nourished, in no acute distress. Head:  normocephalic and atraumatic Eyes:  PERRLA/EOM intact; conjunctiva and lids normal. Mouth:  Teeth, gums and palate normal. Oral mucosa normal. Neck:  supple Lungs:  CTAB Heart:  RRR with occasional ectopy Abdomen:  Bowel sounds positive; abdomen soft and non-tender without masses, organomegaly, or hernias noted. No hepatosplenomegaly. Msk:  Back normal, normal gait. Muscle strength and tone normal. Extremities:  No clubbing or cyanosis. Neurologic:  Alert and oriented x 3.   EKG  Procedure date:  10/08/2010  Findings:      sinus rhythm 90 bpm, PACs, PVC  Impression & Recommendations:  Problem # 1:  VENTRICULAR TACHYCARDIA (ICD-427.1) PVCs are much improved,  symptomatically better no changes at this time He will continue follow with Dr Deborah Chalk and I will see him as needed   Problem # 2:  HYPERTENSION (ICD-401.9) above goal he should avoid NSAIDS as able elevated BP will certainly increase his PVC burden salt restriction  Patient Instructions: 1)  Your physician recommends that you schedule a follow-up appointment in: follow  up as needed

## 2010-11-21 LAB — BLOOD GAS, ARTERIAL
Acid-base deficit: 0.2 mmol/L (ref 0.0–2.0)
Bicarbonate: 23.5 mEq/L (ref 20.0–24.0)
Drawn by: 307971
FIO2: 0.21 %
O2 Saturation: 95.1 %
Patient temperature: 98.6
TCO2: 20.4 mmol/L (ref 0–100)
pCO2 arterial: 37.5 mmHg (ref 35.0–45.0)
pH, Arterial: 7.414 (ref 7.350–7.450)
pO2, Arterial: 70.9 mmHg — ABNORMAL LOW (ref 80.0–100.0)

## 2010-12-05 ENCOUNTER — Encounter (HOSPITAL_COMMUNITY)
Admission: RE | Admit: 2010-12-05 | Discharge: 2010-12-05 | Disposition: A | Discharge status: Home / Self Care | Payer: Medicare Other | Source: Ambulatory Visit | Attending: Neurosurgery | Admitting: Neurosurgery

## 2010-12-05 ENCOUNTER — Telehealth: Payer: Self-pay | Admitting: Internal Medicine

## 2010-12-05 DIAGNOSIS — Z01812 Encounter for preprocedural laboratory examination: Secondary | ICD-10-CM | POA: Insufficient documentation

## 2010-12-05 DIAGNOSIS — Z01810 Encounter for preprocedural cardiovascular examination: Secondary | ICD-10-CM | POA: Insufficient documentation

## 2010-12-05 LAB — SURGICAL PCR SCREEN
MRSA, PCR: NEGATIVE
Staphylococcus aureus: NEGATIVE

## 2010-12-05 LAB — CBC
HCT: 44.1 % (ref 39.0–52.0)
Hemoglobin: 14.8 g/dL (ref 13.0–17.0)
MCH: 31.6 pg (ref 26.0–34.0)
MCHC: 33.6 g/dL (ref 30.0–36.0)
MCV: 94 fL (ref 78.0–100.0)
Platelets: 159 10*3/uL (ref 150–400)
RBC: 4.69 MIL/uL (ref 4.22–5.81)
RDW: 14.8 % (ref 11.5–15.5)
WBC: 6.8 10*3/uL (ref 4.0–10.5)

## 2010-12-05 LAB — BASIC METABOLIC PANEL
BUN: 21 mg/dL (ref 6–23)
CO2: 31 mEq/L (ref 19–32)
Calcium: 9.8 mg/dL (ref 8.4–10.5)
Chloride: 100 mEq/L (ref 96–112)
Creatinine, Ser: 0.93 mg/dL (ref 0.4–1.5)
GFR calc Af Amer: >60 mL/min (ref >60)
GFR calc non Af Amer: >60 mL/min (ref >60)
Glucose, Bld: 96 mg/dL (ref 70–99)
Potassium: 4.6 mEq/L (ref 3.5–5.1)
Sodium: 141 mEq/L (ref 135–145)

## 2010-12-05 NOTE — Telephone Encounter (Signed)
12,LOV,Echo faxed to [facility] @ [phone] 12/05/10/KM

## 2010-12-07 ENCOUNTER — Inpatient Hospital Stay (HOSPITAL_COMMUNITY)
Admission: RE | Admit: 2010-12-07 | Discharge: 2010-12-08 | DRG: 491 | Disposition: A | Discharge status: Home / Self Care | Payer: Medicare Other | Source: Ambulatory Visit | Attending: Neurosurgery | Admitting: Neurosurgery

## 2010-12-07 ENCOUNTER — Inpatient Hospital Stay (HOSPITAL_COMMUNITY): Payer: Medicare Other

## 2010-12-07 DIAGNOSIS — M5137 Other intervertebral disc degeneration, lumbosacral region: Secondary | ICD-10-CM | POA: Diagnosis present

## 2010-12-07 DIAGNOSIS — M51379 Other intervertebral disc degeneration, lumbosacral region without mention of lumbar back pain or lower extremity pain: Secondary | ICD-10-CM | POA: Diagnosis present

## 2010-12-07 DIAGNOSIS — K219 Gastro-esophageal reflux disease without esophagitis: Secondary | ICD-10-CM | POA: Diagnosis present

## 2010-12-07 DIAGNOSIS — M109 Gout, unspecified: Secondary | ICD-10-CM | POA: Diagnosis present

## 2010-12-07 DIAGNOSIS — Z01812 Encounter for preprocedural laboratory examination: Secondary | ICD-10-CM

## 2010-12-07 DIAGNOSIS — M7130 Other bursal cyst, unspecified site: Principal | ICD-10-CM | POA: Diagnosis present

## 2010-12-07 DIAGNOSIS — Z01810 Encounter for preprocedural cardiovascular examination: Secondary | ICD-10-CM

## 2010-12-07 DIAGNOSIS — I10 Essential (primary) hypertension: Secondary | ICD-10-CM | POA: Diagnosis present

## 2010-12-07 DIAGNOSIS — M47817 Spondylosis without myelopathy or radiculopathy, lumbosacral region: Secondary | ICD-10-CM | POA: Diagnosis present

## 2010-12-11 NOTE — Op Note (Signed)
[name], [name]               ACCOUNT NO.:  [id number]  MEDICAL RECORD NO.:  [id number]           PATIENT TYPE:  I  LOCATION:  3006                         FACILITY:  [facility]  PHYSICIAN:  Danae Orleans. Venetia Maxon, M.D.  DATE OF BIRTH:  23-Mar-1934  DATE OF PROCEDURE:  12/07/2010 DATE OF DISCHARGE:                              OPERATIVE REPORT   PREOPERATIVE DIAGNOSES:  Right L5-S1 synovial cyst with degenerative disk disease, spondylosis, and radiculopathy.  POSTOPERATIVE DIAGNOSES:  Right L5-S1 synovial cyst with degenerative disk disease, spondylosis, and radiculopathy.  PROCEDURE:  Right L5-S1 laminectomy with resection of synovial cyst with microdissection.  SURGEON:  Danae Orleans. Venetia Maxon, MD  ASSISTANT:  Hewitt Shorts, MD  ANESTHESIA:  General endotracheal anesthesia.  ESTIMATED BLOOD LOSS:  Minimal.  COMPLICATIONS:  None.  DISPOSITION:  Recovery.  INDICATIONS:  [name] is a 77 year old man with marked right leg weakness with an extremely large synovial cyst at L5-S1 on the right. It was elected to take him to surgery for resection of synovial cyst and decompression of neural elements.  PROCEDURE:  [name] was brought to the operating room.  Following satisfactory and uncomplicated induction of general endotracheal anesthesia and placement of intravenous lines, the patient was placed in a prone position on the Wilson frame.  His low back was prepped and draped in the usual sterile fashion.  The area of planned incision was infiltrated with local lidocaine.  An incision was made in the midline and carried to the lumbodorsal fascia which incised right side of midline.  Subperiosteal dissection was performed exposing the interlaminar space at L5-S1 and intraoperative x-ray with marker probes at L5-S1 and L4-5 levels confirmed correct level.  Subsequently, hemi- semilaminectomy of L5 was performed with the high-speed drill.  There was significant amount of  spondylitic degenerative material including degenerated ligamentum flavum.  A generous decompression was performed. Under microdissection technique, the L5 nerve root was initially identified and then subsequently the S1 nerve root was identified.  We did medial decompression as well and were able to finally identify the normal dura and then very carefully peeled the synovial cyst material away which was densely adherent to the dura.  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  All the thecal sac and nerve roots appeared to be well decompressed.  Hemostasis was assured.  A small piece of Duragen was placed overlying the area of exposed arachnoid and covered with DuraSeal.  The self-retaining retractor was removed.  The lumbodorsal fascia was closed with 0 Vicryl sutures, subcutaneous tissues were reapproximated with 2-0 Vicryl interrupted inverted sutures, and skin edges were reapproximated with 3-0 Vicryl subcuticular stitch.  Wound was dressed with Dermabond.  The patient was extubated in the operating room and taken to the recovery in stable satisfactory condition, having tolerated his operation well.  Counts correct at the end of the case.     Danae Orleans. Venetia Maxon, M.D.     JDS/MEDQ  D:  12/07/2010  T:  12/08/2010  Job:  [id number]  Electronically Signed by Maeola Harman M.D. on 12/11/2010 07:34:51 AM

## 2011-01-23 ENCOUNTER — Other Ambulatory Visit: Payer: Self-pay | Admitting: Internal Medicine

## 2011-01-25 NOTE — Op Note (Signed)
[name], [name]                         ACCOUNT NO.:  [id number]   MEDICAL RECORD NO.:  [id number]                   PATIENT TYPE:  AMB   LOCATION:  ENDO                                 FACILITY:  [facility]   PHYSICIAN:  Petra Kuba, M.D.                 DATE OF BIRTH:  1933/09/18   DATE OF PROCEDURE:  07/19/2003  DATE OF DISCHARGE:                                 OPERATIVE REPORT   PROCEDURE:  Colonoscopy.   INDICATIONS:  Screening.   Consent was signed after risks, benefits, methods, and options were  thoroughly discussed in the past.   MEDICINES USED:  Demerol 70 and Versed 6.   PROCEDURE IN DETAIL:  Rectal inspection was pertinent for external  hemorrhoids with some tears.  Digital exam was negative.  The video  colonoscope was inserted and easily advanced around the colon to the cecum.  This did require some abdominal pressure but no position changes.  No  obvious abnormality was seen on insertion.  The cecum was identified by the  appendiceal orifice and the ileocecal valve.  The scope was inserted a short  ways into the terminal ileum, which was normal.  Photo documentation was  obtained.  The scope was slowly withdrawn.  The prep was fairly adequate.  With washing and suctioning, adequate visualization was obtained.  On slow  withdrawal through the colon, the cecum and the ascending were normal.  In  the more proximal transverse colon, a small polyp was seen there and  electrocautery applied and suctioned through the scope and collected in the  trap.  In the more distal transverse, another tiny to small polyp was seen  and was hot biopsied x1 and put in the same container.  The scope was  further withdrawn.  No additional findings were seen as we slowly withdrew  back to the rectum.  In the rectum, a few hyperplastic-appearing polyps were  seen.  They were cold biopsied and put in a separate container.  Anorectal  pull-through and retroflexion confirmed the  hemorrhoids with the tears.  The  scope was reinserted a short ways up the left side of the colon.  Air was  suctioned and the scope removed.  The patient tolerated the procedure well.  There was no obvious immediate complication.   ENDOSCOPIC DIAGNOSES:  1. Internal and external hemorrhoids with tears.  2. Rectal hyperplastic-appearing polyps, cold biopsied.  3. Two tiny to small transverse polyps, one hot biopsied in the distal     transverse and one snared in the proximal transverse.  4. Otherwise within normal limits to the terminal ileum.    PLAN:  1. I would be happy to see back p.r.n.  2. Await pathology.  Probably recheck colon screening in five years if doing     well medically.  3. Otherwise, return care to Dr. Jarold Motto for the customary health care,  including yearly rectals and guaiacs.                                               Petra Kuba, M.D.    MEM/MEDQ  D:  07/19/2003  T:  07/19/2003  Job:  [id number]   cc:   Brunilda Payor, MD

## 2011-01-25 NOTE — Procedures (Signed)
[facility]  Patient:    [name], [name]                        MRN: [id number] Proc. Date: 08/08/00 Attending:  Petra Kuba, M.D. CC:         Barry Dienes. Eloise Harman, M.D.   Procedure Report  PROCEDURE:  Colonoscopy.  INDICATIONS FOR PROCEDURE:  History of colon polyps due for repeat screening and probable post cholecystectomy and diarrhea.  Consent was signed after risks, benefits, methods, and options were thoroughly discussed in the office.  MEDICINES USED:  Demerol 50, versed 6.  DESCRIPTION OF PROCEDURE:  Rectal inspection was pertinent for significant external hemorrhoids. Digital exam was negative. The video colonoscope was inserted and with mild difficulty due to a long looping colon was able to advance to the cecum. This did not require any position changes but some left lower quadrant pressure. The cecum was identified by the appendiceal orifice and the ileocecal valve. On insertion in the hepatic flexure, a 3 mm polyp was seen and was hot biopsied x 2. The scope was inserted a short ways into the terminal ileum which was normal. Photo documentation was obtained. The scope was then slowly withdrawn. The prep on the right side was fairly adequate, lots of bubbles which required lots of washing and suctioning. The prep was better on the left side. Small lesions could have been missed. Upon slow withdrawal through the colon, no cecal or ascending abnormalities were seen. We went ahead and took 1 more hot biopsy of the polyp seen on insertion in the hepatic flexure. The scope was further withdrawn. An additional 3 tiny left sided polyps were seen including rectal, sigmoid and probably descending which were all hot biopsied x 1. There was also a rare left sided diverticula seen but no other abnormalities. Once back in the rectum, the scope was retroflexed revealing significant internal hemorrhoids as well. Anal rectal pull through confirmed the  hemorrhoids. The scope was straightened, readvanced a short ways up the sigmoid, air was suctioned, the scope removed. The patient tolerated the procedure well and there was obvious or immediate complications.  ENDOSCOPIC DIAGNOSIS: 1. Significant internal/external hemorrhoids. 2. Four tiny to small polyps in the rectum, sigmoid, descending and hepatic    flexure status post hot biopsy. 3. Rare left sided ticks. 4. Left greater than right prep with lots of washing and suctioning done. 5. Otherwise within normal limits to the terminal ileum.  PLAN:  Await pathology to determine future colonic screening. Will give some hemorrhoidal creams but if hemorrhoidal symptoms continue would send him to a surgeon and I will be happy to see back p.r.n. otherwise return care to Dr. Eloise Harman for the customary health care maintenance to include yearly rectals and guaiacs. DD:  08/08/00 TD:  08/08/00 Job: [id number]

## 2011-02-11 ENCOUNTER — Encounter: Payer: Self-pay | Admitting: Internal Medicine

## 2011-02-25 ENCOUNTER — Encounter: Payer: Self-pay | Admitting: Internal Medicine

## 2011-02-25 ENCOUNTER — Ambulatory Visit (INDEPENDENT_AMBULATORY_CARE_PROVIDER_SITE_OTHER): Payer: Medicare Other | Admitting: Internal Medicine

## 2011-02-25 DIAGNOSIS — R0600 Dyspnea, unspecified: Secondary | ICD-10-CM | POA: Insufficient documentation

## 2011-02-25 DIAGNOSIS — I493 Ventricular premature depolarization: Secondary | ICD-10-CM

## 2011-02-25 DIAGNOSIS — R0602 Shortness of breath: Secondary | ICD-10-CM

## 2011-02-25 DIAGNOSIS — I10 Essential (primary) hypertension: Secondary | ICD-10-CM

## 2011-02-25 DIAGNOSIS — I4949 Other premature depolarization: Secondary | ICD-10-CM

## 2011-02-25 DIAGNOSIS — R0609 Other forms of dyspnea: Secondary | ICD-10-CM | POA: Insufficient documentation

## 2011-02-25 NOTE — Progress Notes (Signed)
The patient presents today for routine electrophysiology followup.  Since last being seen in our clinic, the patient reports doing very well.   He has stable SOB with moderate activity.  He denies palpitations.  Today, he denies symptoms of palpitations, chest pain, orthopnea, PND, lower extremity edema, dizziness, presyncope, syncope, or neurologic sequela.  The patient feels that he is tolerating medications without difficulties and is otherwise without complaint today.   Past Medical History  Diagnosis Date  . Hypercholesteremia   . OSA (obstructive sleep apnea)     compliant with CPAP  . Dyspnea     when supine  . Obesity   . Gout   . NSVT (nonsustained ventricular tachycardia)   . PVC (premature ventricular contraction)     s/p PVC ablation 05/29/2010  . HTN (hypertension)   . Coronary artery ectasia     with no stenosis by cath 8/11   Past Surgical History  Procedure Date  . Cholecystectomy 1990  . Appendectomy 1978  . Poly removed from nose as a child   . Pvc ablation 05/29/2010    Current Outpatient Prescriptions  Medication Sig Dispense Refill  . allopurinol (ZYLOPRIM) 300 MG tablet Take 300 mg by mouth daily.        Marland Kitchen aspirin 81 MG tablet Take 81 mg by mouth daily.        Marland Kitchen doxazosin (CARDURA) 4 MG tablet Take 4 mg by mouth at bedtime.        Marland Kitchen lisinopril (PRINIVIL,ZESTRIL) 40 MG tablet Take 40 mg by mouth daily.        Marland Kitchen lovastatin (MEVACOR) 40 MG tablet Take 40 mg by mouth at bedtime.        . metoprolol succinate (TOPROL-XL) 25 MG 24 hr tablet Take 25 mg by mouth daily.        . traZODone (DESYREL) 100 MG tablet Take 100 mg by mouth at bedtime.        . verapamil (VERELAN PM) 240 MG 24 hr capsule TAKE ONE CAPSULE EVERY DAY  30 capsule  6  . DISCONTD: ibuprofen (ADVIL,MOTRIN) 200 MG tablet Take 200 mg by mouth 2 (two) times daily.        Marland Kitchen DISCONTD: HYDROcodone-acetaminophen (NORCO) 5-325 MG per tablet Take 1 tablet by mouth daily.          No Known  Allergies  History   Social History  . Marital Status: Married    Spouse Name: N/A    Number of Children: N/A  . Years of Education: N/A   Occupational History  . Retired    Social History Main Topics  . Smoking status: Former Smoker -- 3.0 packs/day for 20 years    Types: Cigarettes    Quit date: 09/09/1984  . Smokeless tobacco: Not on file  . Alcohol Use: Yes     6 glasses of wine per week  . Drug Use: No  . Sexually Active: Not on file   Other Topics Concern  . Not on file   Social History Narrative  . No narrative on file    Family History  Problem Relation Age of Onset  . Aneurysm Father 65    brain aneurysm  . Breast cancer Mother   . Breast cancer Daughter    Physical Exam: Filed Vitals:   02/25/11 1020  BP: 136/78  Pulse: 54  Resp: 18  Height: 5\' 10"  (1.778 m)  Weight: 211 lb (95.709 kg)    GEN- The patient is well  appearing, alert and oriented x 3 today.   Head- normocephalic, atraumatic Eyes-  Sclera clear, conjunctiva pink Ears- hearing intact Oropharynx- clear Neck- supple, no JVP Lymph- no cervical lymphadenopathy Lungs- Clear to ausculation bilaterally, normal work of breathing Heart-bradycardic irregular rhythm, no murmurs, rubs or gallops, PMI not laterally displaced GI- soft, NT, ND, + BS Extremities- no clubbing, cyanosis, or edema MS- no significant deformity or atrophy Skin- no rash or lesion Psych- euthymic mood, full affect Neuro- strength and sensation are intact  ekg today reveals sinus bradycardia 54 bpm, occasional PVCs  Assessment and Plan:

## 2011-02-25 NOTE — Assessment & Plan Note (Signed)
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

## 2011-02-25 NOTE — Patient Instructions (Addendum)
Your physician wants you to follow-up in: 6 months with Dr Jacquiline Doe will receive a reminder letter in the mail two months in advance. If you don't receive a letter, please call our office to schedule the follow-up appointment.  Your physician has recommended you make the following change in your medication: stop Toprol(Metoprolol)

## 2011-02-25 NOTE — Assessment & Plan Note (Signed)
Stable No change required today  

## 2011-02-25 NOTE — Assessment & Plan Note (Signed)
Improved s/p ablation,  He appears to be asymptomatic at this point We will stop toprol today. Continue verapamil

## 2011-04-02 ENCOUNTER — Emergency Department (HOSPITAL_COMMUNITY): Payer: Medicare Other

## 2011-04-02 ENCOUNTER — Inpatient Hospital Stay (HOSPITAL_COMMUNITY)
Admission: EM | Admit: 2011-04-02 | Discharge: 2011-04-07 | DRG: 872 | Disposition: A | Discharge status: Home / Self Care | Payer: Medicare Other | Attending: Internal Medicine | Admitting: Internal Medicine

## 2011-04-02 ENCOUNTER — Telehealth: Payer: Self-pay | Admitting: Nurse Practitioner

## 2011-04-02 DIAGNOSIS — N390 Urinary tract infection, site not specified: Secondary | ICD-10-CM | POA: Diagnosis present

## 2011-04-02 DIAGNOSIS — J986 Disorders of diaphragm: Secondary | ICD-10-CM | POA: Diagnosis present

## 2011-04-02 DIAGNOSIS — K560 Paralytic ileus: Secondary | ICD-10-CM | POA: Diagnosis present

## 2011-04-02 DIAGNOSIS — I4891 Unspecified atrial fibrillation: Secondary | ICD-10-CM | POA: Diagnosis present

## 2011-04-02 DIAGNOSIS — A419 Sepsis, unspecified organism: Principal | ICD-10-CM | POA: Diagnosis present

## 2011-04-02 DIAGNOSIS — E669 Obesity, unspecified: Secondary | ICD-10-CM | POA: Diagnosis present

## 2011-04-02 DIAGNOSIS — I2510 Atherosclerotic heart disease of native coronary artery without angina pectoris: Secondary | ICD-10-CM | POA: Diagnosis present

## 2011-04-02 DIAGNOSIS — R5383 Other fatigue: Secondary | ICD-10-CM

## 2011-04-02 DIAGNOSIS — N400 Enlarged prostate without lower urinary tract symptoms: Secondary | ICD-10-CM | POA: Diagnosis present

## 2011-04-02 DIAGNOSIS — M109 Gout, unspecified: Secondary | ICD-10-CM | POA: Diagnosis present

## 2011-04-02 DIAGNOSIS — E876 Hypokalemia: Secondary | ICD-10-CM | POA: Diagnosis present

## 2011-04-02 DIAGNOSIS — Z7982 Long term (current) use of aspirin: Secondary | ICD-10-CM

## 2011-04-02 DIAGNOSIS — Z860100 Personal history of colon polyps, unspecified: Secondary | ICD-10-CM

## 2011-04-02 DIAGNOSIS — Z87891 Personal history of nicotine dependence: Secondary | ICD-10-CM

## 2011-04-02 DIAGNOSIS — J449 Chronic obstructive pulmonary disease, unspecified: Secondary | ICD-10-CM | POA: Diagnosis present

## 2011-04-02 DIAGNOSIS — G4733 Obstructive sleep apnea (adult) (pediatric): Secondary | ICD-10-CM | POA: Diagnosis present

## 2011-04-02 DIAGNOSIS — J4489 Other specified chronic obstructive pulmonary disease: Secondary | ICD-10-CM | POA: Diagnosis present

## 2011-04-02 DIAGNOSIS — I509 Heart failure, unspecified: Secondary | ICD-10-CM | POA: Diagnosis present

## 2011-04-02 DIAGNOSIS — K56609 Unspecified intestinal obstruction, unspecified as to partial versus complete obstruction: Secondary | ICD-10-CM | POA: Diagnosis present

## 2011-04-02 DIAGNOSIS — E785 Hyperlipidemia, unspecified: Secondary | ICD-10-CM | POA: Diagnosis present

## 2011-04-02 DIAGNOSIS — K5900 Constipation, unspecified: Secondary | ICD-10-CM | POA: Diagnosis present

## 2011-04-02 DIAGNOSIS — I498 Other specified cardiac arrhythmias: Secondary | ICD-10-CM | POA: Diagnosis present

## 2011-04-02 DIAGNOSIS — I10 Essential (primary) hypertension: Secondary | ICD-10-CM | POA: Diagnosis present

## 2011-04-02 DIAGNOSIS — R5381 Other malaise: Secondary | ICD-10-CM

## 2011-04-02 NOTE — Telephone Encounter (Signed)
pts wife called stating that for past 2 days he's been feeling weak, no energy, gassy.  Saw pcp yesterday and labs drawn.  Results not known.  Today, felt worse.  No fever but has chills, has been clammy.  No c/p, palps, or sob.  Currently sleeping.  Wife concerned.  adivsed that Ss sound flu-like and may represent any number of illnesses.  If he's feeling poorly tonight than he should go to ER, otw, check in w/ PCP tomorrow.

## 2011-04-03 ENCOUNTER — Emergency Department (HOSPITAL_COMMUNITY): Payer: Medicare Other

## 2011-04-03 DIAGNOSIS — K56609 Unspecified intestinal obstruction, unspecified as to partial versus complete obstruction: Secondary | ICD-10-CM

## 2011-04-03 DIAGNOSIS — I517 Cardiomegaly: Secondary | ICD-10-CM

## 2011-04-03 LAB — CBC
HCT: 36.9 % — ABNORMAL LOW (ref 39.0–52.0)
HCT: 35.8 % — ABNORMAL LOW (ref 39.0–52.0)
Hemoglobin: 12.6 g/dL — ABNORMAL LOW (ref 13.0–17.0)
Hemoglobin: 12.3 g/dL — ABNORMAL LOW (ref 13.0–17.0)
MCH: 30.7 pg (ref 26.0–34.0)
MCH: 30.9 pg (ref 26.0–34.0)
MCHC: 34.1 g/dL (ref 30.0–36.0)
MCHC: 34.4 g/dL (ref 30.0–36.0)
MCV: 90 fL (ref 78.0–100.0)
MCV: 89.9 fL (ref 78.0–100.0)
Platelets: 179 10*3/uL (ref 150–400)
Platelets: 158 10*3/uL (ref 150–400)
RBC: 4.1 MIL/uL — ABNORMAL LOW (ref 4.22–5.81)
RBC: 3.98 MIL/uL — ABNORMAL LOW (ref 4.22–5.81)
RDW: 14.2 % (ref 11.5–15.5)
RDW: 14.2 % (ref 11.5–15.5)
WBC: 12.9 10*3/uL — ABNORMAL HIGH (ref 4.0–10.5)
WBC: 9.6 10*3/uL (ref 4.0–10.5)

## 2011-04-03 LAB — POCT I-STAT, CHEM 8
BUN: 17 mg/dL (ref 6–23)
Calcium, Ion: 1.06 mmol/L — ABNORMAL LOW (ref 1.12–1.32)
Chloride: 100 mEq/L (ref 96–112)
Creatinine, Ser: 1 mg/dL (ref 0.50–1.35)
Glucose, Bld: 110 mg/dL — ABNORMAL HIGH (ref 70–99)
HCT: 36 % — ABNORMAL LOW (ref 39.0–52.0)
Hemoglobin: 12.2 g/dL — ABNORMAL LOW (ref 13.0–17.0)
Potassium: 3.4 mEq/L — ABNORMAL LOW (ref 3.5–5.1)
Sodium: 136 mEq/L (ref 135–145)
TCO2: 23 mmol/L (ref 0–100)

## 2011-04-03 LAB — URINALYSIS, ROUTINE W REFLEX MICROSCOPIC
Bilirubin Urine: NEGATIVE
Glucose, UA: NEGATIVE mg/dL
Ketones, ur: NEGATIVE mg/dL
Nitrite: NEGATIVE
Protein, ur: NEGATIVE mg/dL
Specific Gravity, Urine: 1.011 (ref 1.005–1.030)
Urobilinogen, UA: 1 mg/dL (ref 0.0–1.0)
pH: 5.5 (ref 5.0–8.0)

## 2011-04-03 LAB — DIFFERENTIAL
Basophils Absolute: 0 10*3/uL (ref 0.0–0.1)
Basophils Relative: 0 % (ref 0–1)
Eosinophils Absolute: 0 10*3/uL (ref 0.0–0.7)
Eosinophils Relative: 0 % (ref 0–5)
Lymphocytes Relative: 3 % — ABNORMAL LOW (ref 12–46)
Lymphs Abs: 0.4 10*3/uL — ABNORMAL LOW (ref 0.7–4.0)
Monocytes Absolute: 0.7 10*3/uL (ref 0.1–1.0)
Monocytes Relative: 5 % (ref 3–12)
Neutro Abs: 11.8 10*3/uL — ABNORMAL HIGH (ref 1.7–7.7)
Neutrophils Relative %: 92 % — ABNORMAL HIGH (ref 43–77)

## 2011-04-03 LAB — CK TOTAL AND CKMB (NOT AT ARMC)
CK, MB: 4.9 ng/mL — ABNORMAL HIGH (ref 0.3–4.0)
Relative Index: 4 — ABNORMAL HIGH (ref 0.0–2.5)
Total CK: 122 U/L (ref 7–232)

## 2011-04-03 LAB — BASIC METABOLIC PANEL
BUN: 16 mg/dL (ref 6–23)
CO2: 23 mEq/L (ref 19–32)
Calcium: 8.1 mg/dL — ABNORMAL LOW (ref 8.4–10.5)
Chloride: 99 mEq/L (ref 96–112)
Creatinine, Ser: 0.74 mg/dL (ref 0.50–1.35)
GFR calc Af Amer: >60 mL/min (ref >60)
GFR calc non Af Amer: >60 mL/min (ref >60)
Glucose, Bld: 98 mg/dL (ref 70–99)
Potassium: 3.4 mEq/L — ABNORMAL LOW (ref 3.5–5.1)
Sodium: 133 mEq/L — ABNORMAL LOW (ref 135–145)

## 2011-04-03 LAB — TSH: TSH: 1.352 u[IU]/mL (ref 0.350–4.500)

## 2011-04-03 LAB — COMPREHENSIVE METABOLIC PANEL
ALT: 24 U/L (ref 0–53)
AST: 31 U/L (ref 0–37)
Albumin: 3.3 g/dL — ABNORMAL LOW (ref 3.5–5.2)
Alkaline Phosphatase: 84 U/L (ref 39–117)
BUN: 18 mg/dL (ref 6–23)
CO2: 25 mEq/L (ref 19–32)
Calcium: 8.9 mg/dL (ref 8.4–10.5)
Chloride: 97 mEq/L (ref 96–112)
Creatinine, Ser: 0.89 mg/dL (ref 0.50–1.35)
GFR calc Af Amer: >60 mL/min (ref >60)
GFR calc non Af Amer: >60 mL/min (ref >60)
Glucose, Bld: 108 mg/dL — ABNORMAL HIGH (ref 70–99)
Potassium: 3.6 mEq/L (ref 3.5–5.1)
Sodium: 133 mEq/L — ABNORMAL LOW (ref 135–145)
Total Bilirubin: 0.5 mg/dL (ref 0.3–1.2)
Total Protein: 6.6 g/dL (ref 6.0–8.3)

## 2011-04-03 LAB — LACTIC ACID, PLASMA: Lactic Acid, Venous: 1.1 mmol/L (ref 0.5–2.2)

## 2011-04-03 LAB — PRO B NATRIURETIC PEPTIDE: Pro B Natriuretic peptide (BNP): 7150 pg/mL — ABNORMAL HIGH (ref 0–450)

## 2011-04-03 LAB — CARDIAC PANEL(CRET KIN+CKTOT+MB+TROPI)
CK, MB: 5.1 ng/mL — ABNORMAL HIGH (ref 0.3–4.0)
CK, MB: 4.9 ng/mL — ABNORMAL HIGH (ref 0.3–4.0)
Relative Index: 4.1 — ABNORMAL HIGH (ref 0.0–2.5)
Relative Index: 4.5 — ABNORMAL HIGH (ref 0.0–2.5)
Total CK: 124 U/L (ref 7–232)
Total CK: 110 U/L (ref 7–232)
Troponin I: 2.34 ng/mL (ref <0.30)
Troponin I: 1.43 ng/mL (ref <0.30)

## 2011-04-03 LAB — MRSA PCR SCREENING: MRSA by PCR: NEGATIVE

## 2011-04-03 LAB — GLUCOSE, CAPILLARY: Glucose-Capillary: 117 mg/dL — ABNORMAL HIGH (ref 70–99)

## 2011-04-03 LAB — URINE MICROSCOPIC-ADD ON

## 2011-04-03 LAB — TROPONIN I: Troponin I: 2.14 ng/mL (ref <0.30)

## 2011-04-03 LAB — PROCALCITONIN: Procalcitonin: 14.33 ng/mL

## 2011-04-03 LAB — LIPASE, BLOOD: Lipase: 12 U/L (ref 11–59)

## 2011-04-03 MED ORDER — IOHEXOL 300 MG/ML  SOLN
100.0000 mL | Freq: Once | INTRAMUSCULAR | Status: AC | PRN
  Administered 2011-04-03: 100 mL via INTRAVENOUS

## 2011-04-04 DIAGNOSIS — I4891 Unspecified atrial fibrillation: Secondary | ICD-10-CM

## 2011-04-04 LAB — URINE CULTURE
Colony Count: >=100000
Culture  Setup Time: 201207250911

## 2011-04-04 LAB — CBC
HCT: 36 % — ABNORMAL LOW (ref 39.0–52.0)
Hemoglobin: 12.3 g/dL — ABNORMAL LOW (ref 13.0–17.0)
MCH: 30.8 pg (ref 26.0–34.0)
MCHC: 34.2 g/dL (ref 30.0–36.0)
MCV: 90.2 fL (ref 78.0–100.0)
Platelets: 188 10*3/uL (ref 150–400)
RBC: 3.99 MIL/uL — ABNORMAL LOW (ref 4.22–5.81)
RDW: 14.5 % (ref 11.5–15.5)
WBC: 8.9 10*3/uL (ref 4.0–10.5)

## 2011-04-04 LAB — COMPREHENSIVE METABOLIC PANEL
ALT: 23 U/L (ref 0–53)
AST: 27 U/L (ref 0–37)
Albumin: 2.8 g/dL — ABNORMAL LOW (ref 3.5–5.2)
Alkaline Phosphatase: 76 U/L (ref 39–117)
BUN: 20 mg/dL (ref 6–23)
CO2: 25 mEq/L (ref 19–32)
Calcium: 8.3 mg/dL — ABNORMAL LOW (ref 8.4–10.5)
Chloride: 102 mEq/L (ref 96–112)
Creatinine, Ser: 0.78 mg/dL (ref 0.50–1.35)
GFR calc Af Amer: >60 mL/min (ref >60)
GFR calc non Af Amer: >60 mL/min (ref >60)
Glucose, Bld: 94 mg/dL (ref 70–99)
Potassium: 3.5 mEq/L (ref 3.5–5.1)
Sodium: 138 mEq/L (ref 135–145)
Total Bilirubin: 0.5 mg/dL (ref 0.3–1.2)
Total Protein: 5.9 g/dL — ABNORMAL LOW (ref 6.0–8.3)

## 2011-04-04 LAB — TROPONIN I: Troponin I: 1.11 ng/mL (ref <0.30)

## 2011-04-04 LAB — CARDIAC PANEL(CRET KIN+CKTOT+MB+TROPI)
CK, MB: 4.5 ng/mL — ABNORMAL HIGH (ref 0.3–4.0)
Relative Index: 3.9 — ABNORMAL HIGH (ref 0.0–2.5)
Total CK: 114 U/L (ref 7–232)
Troponin I: 2.02 ng/mL (ref <0.30)

## 2011-04-05 LAB — CULTURE, BLOOD (ROUTINE X 2)
Culture  Setup Time: 201207250847
Culture  Setup Time: 201207250847

## 2011-04-05 LAB — COMPREHENSIVE METABOLIC PANEL
ALT: 28 U/L (ref 0–53)
AST: 32 U/L (ref 0–37)
Albumin: 3.2 g/dL — ABNORMAL LOW (ref 3.5–5.2)
Alkaline Phosphatase: 83 U/L (ref 39–117)
BUN: 27 mg/dL — ABNORMAL HIGH (ref 6–23)
CO2: 23 mEq/L (ref 19–32)
Calcium: 8.7 mg/dL (ref 8.4–10.5)
Chloride: 100 mEq/L (ref 96–112)
Creatinine, Ser: 0.85 mg/dL (ref 0.50–1.35)
GFR calc Af Amer: >60 mL/min (ref >60)
GFR calc non Af Amer: >60 mL/min (ref >60)
Glucose, Bld: 65 mg/dL — ABNORMAL LOW (ref 70–99)
Potassium: 3 mEq/L — ABNORMAL LOW (ref 3.5–5.1)
Sodium: 139 mEq/L (ref 135–145)
Total Bilirubin: 0.5 mg/dL (ref 0.3–1.2)
Total Protein: 6.6 g/dL (ref 6.0–8.3)

## 2011-04-05 LAB — CBC
HCT: 39.8 % (ref 39.0–52.0)
Hemoglobin: 13.5 g/dL (ref 13.0–17.0)
MCH: 30.8 pg (ref 26.0–34.0)
MCHC: 33.9 g/dL (ref 30.0–36.0)
MCV: 90.9 fL (ref 78.0–100.0)
Platelets: 219 10*3/uL (ref 150–400)
RBC: 4.38 MIL/uL (ref 4.22–5.81)
RDW: 14.5 % (ref 11.5–15.5)
WBC: 10.7 10*3/uL — ABNORMAL HIGH (ref 4.0–10.5)

## 2011-04-05 LAB — PROTIME-INR
INR: 1.15 (ref 0.00–1.49)
Prothrombin Time: 14.9 seconds (ref 11.6–15.2)

## 2011-04-06 LAB — BASIC METABOLIC PANEL
BUN: 18 mg/dL (ref 6–23)
CO2: 26 mEq/L (ref 19–32)
Calcium: 8.6 mg/dL (ref 8.4–10.5)
Chloride: 104 mEq/L (ref 96–112)
Creatinine, Ser: 0.74 mg/dL (ref 0.50–1.35)
GFR calc Af Amer: >60 mL/min (ref >60)
GFR calc non Af Amer: >60 mL/min (ref >60)
Glucose, Bld: 94 mg/dL (ref 70–99)
Potassium: 4.3 mEq/L (ref 3.5–5.1)
Sodium: 140 mEq/L (ref 135–145)

## 2011-04-06 LAB — CBC
HCT: 39.8 % (ref 39.0–52.0)
Hemoglobin: 13.6 g/dL (ref 13.0–17.0)
MCH: 30.6 pg (ref 26.0–34.0)
MCHC: 34.2 g/dL (ref 30.0–36.0)
MCV: 89.4 fL (ref 78.0–100.0)
Platelets: 206 10*3/uL (ref 150–400)
RBC: 4.45 MIL/uL (ref 4.22–5.81)
RDW: 14.1 % (ref 11.5–15.5)
WBC: 9.1 10*3/uL (ref 4.0–10.5)

## 2011-04-06 LAB — PROTIME-INR
INR: 1.18 (ref 0.00–1.49)
Prothrombin Time: 15.2 seconds (ref 11.6–15.2)

## 2011-04-07 LAB — PROTIME-INR
INR: 1.26 (ref 0.00–1.49)
Prothrombin Time: 16.1 seconds — ABNORMAL HIGH (ref 11.6–15.2)

## 2011-04-07 NOTE — Discharge Summary (Signed)
[name], [name]               ACCOUNT NO.:  [id number]  MEDICAL RECORD NO.:  [id number]  LOCATION:                                 FACILITY:  PHYSICIAN:  Gwen Pounds, MD       DATE OF BIRTH:  01/26/1934  DATE OF ADMISSION: DATE OF DISCHARGE:                              DISCHARGE SUMMARY   DISCHARGE DIAGNOSES: 1. Escherichia coli urosepsis/urinary tract infection/E coli Bacteremia. 2. An ileus with scanning revealing partial small bowel obstruction. 3. Atrial fibrillation that is now converted back to normal sinus     rhythm. 4. Elevated troponin, believed to be a demand ischemia. 5. Hypokalemia, resolved. 6. Congestive heart failure with reduced ejection fraction on     echocardiogram to 45-50%. 7. History of laminectomy. 8. Hypertension. 9. Hyperlipidemia. 10.Obstructive sleep apnea, on bilevel positive airway pressure. 11.Obesity. 12.Gout. 13.History of cholecystectomy. 14.History of appendectomy. 15.Known paralyzed right hemidiaphragm. 16.History of ventricular tachycardia/premature ventricular     contraction ablation per Dr. Johney Frame. 17.Heart catheterization in August 2011 showing diffuse coronary     ectasia without significant stenosis. 18. restless legs  19. Colonic Polyps, 2010 colonoscopy 20. BPH 21 Constipation S/p Bowel prep and BM 22. H/O Microhematuria 23. LLL Pneumonia (2/03) 24 Chronic Rhinitis  Physicians involved in care:  Magod, Dahlstedt/Mullins, Jearld Fenton, Regal, [place] [facility], [name] (EP), Deborah Chalk (cards), Clance (pulm), Dohmeier (sleep, neurology)   DISCHARGE PROCEDURES: 1. Consultation with Cardiology, Internal Medicine, Surgery, and     Gastroenterology. 2. Medical management. 3. Two-D echocardiogram with EF showing 45-50% with atrium was mildly     to moderately dilated. 4. CT scan of the abdomen and pelvis on April 03, 2011, shows a focally     inflamed segment of the small bowel within the right lower     abdominal cavity is nonspecific  and it appears to be infectious,     inflammatory, or ischemic colitis.  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  Followup     1 year pancreas MRI and MRCP is recommended and 1-cm intermediary     density lesion arising from the interpolar left kidney has     developed since 2006.  Given the size of this lesion, it may be     difficult to identify by ultrasound, therefore renal protocol CT or     MRI would be recommended for definitive characterization 5. KUB showed small bowel distention with air-fluid levels suggesting     ileus versus distal small bowel obstruction and a small right     pleural effusion.  DISCHARGE MEDICATION LIST: 1. Coumadin 5 mg p.o. daily. 2. Cefdinir 300 mg by mouth twice daily for 9 more days. 3. Flora-Q 1 capsule by mouth daily. 4. Ipratropium 21 mcg nasal spray 2 sprays nasally twice daily. 5. Metoprolol 25 mg by mouth twice daily. 6. Coumadin per protocol. 7. Lisinopril 40 mg 1/2 tablet p.o. daily. 8.  Allopurinol 300 mg p.o. daily. 9. Aspirin 81 daily. 10.Doxazosin 4 mg p.o. daily. 11.Lovastatin 40 mg 1 p.o. daily at bedtime. 12.Trazodone 100 mg p.o. daily at bedtime.  He is to stop taking his verapamil.  He is to continue on his BiPAP as directed.  AFTERCARE FOLLOWUP INSTRUCTIONS:  He is to follow up for a Coumadin check INR with Olegario Messier on Tuesday or Wednesday.  He is to have an ophthalmologic evaluation on Tuesday at 11 a.m. as scheduled.  He has got a GI appointment approximately April 16, 2011, for endo/colon schedule and possibly IVUS.  Follow up with Cardiology, [facility], Dr. Johney Frame in about a month to see if he can off the Coumadin and Dr. Eloise Harman as directed.  HISTORY OF  PRESENT ILLNESS:  Briefly, Mr. [name] is a 77 year old male status post PVC ablation in the past issue who presented to medical attention on April 02, 2011, with profound generalized weakness, diaphoresis, chills, rigors, fever, abdominal bloating, and abdominal discomfort.  He was in the office and was seen by Dr. Eloise Harman, got treatment a couple of days prior and then went to work, did well and then got sicker and then represented to the emergency department.  In the ER, he was a little hypotensive and required IV fluids bolusing. Workup was started and revealed an ileus-type picture versus a small bowel obstruction and his labs came back revealing an elevated CK and troponin and it was felt that he might be having myocardial infarction. Dr. Marca Ancona was consulted, came and evaluated him.  White count was 12.9 with left shift.  Lactate was 1.1.  CMET was within normal limits and the troponin was 2.14.  IMPRESSION AND PLAN:  This is a 77 year old male with symptomatic premature ventricular contractions, status post ablation and catheterization, without significant coronary artery disease in August 2011 who presented with weakness, chills, and abdominal bloating and was found to have an elevated troponin.  It was felt to be demand ischemia, but not a true MI and it was felt that the demand was related to some sort of underlying infection.  Chest x-ray was negative.  Empiric vancomycin and Zosyn was ordered for broad-spectrum antibiotics and CT scan of the abdomen was obtained and The patient was admitted to the ICU for the possibility of an MI and was promptly ruled out for MI.  He was felt to be volume overloaded at the time of presentation.  HOSPITAL COURSE:  Mr. Miliano Cotten was admitted through the emergency department per Cardiology for possibility of an MI which turned out to be more of demand ischemia, but all of his symptoms were more infectious in etiology and  included GI issues.  The KUB was followed by the CT scan which showed an ileus versus partial small bowel obstruction and eventually urinalysis was obtained and urine culture came back with 100,000 colonies forming units per mL of E. coli, which was sensitive to whole panel except for the ampicillin.  He also had blood cultures which eventually grew out E. coli as well, so this was urosepsis causing an ileus with partial small bowel obstruction type findings on scanning. GI Surgery and Cardiology all stayed on board and helped manage the patient as the patient was transferred over to Dr. Georgiann Hahn service.  He was kept on broad-spectrum antibiotics and after the E. coli came back his antibiotics were narrowed and eventually he was switched over to oral antibiotics as he got better.  Of note, he did  go into atrial fibrillation and required medication doses and echocardiogram and placed on Coumadin and he will be on Coumadin on discharge to get his INR between 2.0-3.0 and hopefully he can come off this.  It was felt that the atrial fibrillation was related to the underlying illness and unlikely to come back.  Surgery did not think he needed to go to the operating room.  He became somewhat improved overtime.  He was able to tolerate a diet and Dr. Evette Cristal came and saw the patient and felt that the partial small bowel obstruction was improving.  The etiology either appears to be infectious, inflammatory, or ischemic.  He is fully aware of the hyperdense lesion of the pancreas.  He believes that the GI issues were all related to the urosepsis and multiple other problems.  They recommended either MRI or an endoscopic ultrasound as an outpatient and will be discussed further at the April 16, 2011, outpatient visit.  There was some hypokalemia and potassium was replenished.  Between April 06, 2011, and April 07, 2011, on my rounding of him over the weekend his E. coli urosepsis was  improving.  His physical function was improving. His physical abilities were improving.  We changed his antibiotic from Zosyn to Rocephin to oral cefdinir.  He will complete a 14-day course. For his elevated troponin I, it is considered enzyme leak, an outpatient stress test can be ordered per Cardiology at their discretion.  As far as his ileus is resolved, now he had some diarrhea yesterday and now he feels constipated and bloated and we will make sure he has a decent bowel movement evacuation before he leaves.  As far as his AFib was rate controlled and that he was back in normal sinus rhythm, the Coumadin goal is INR of 2-3 and hopefully can come off the Coumadin as an outpatient. He remained on his BiPAP at night.  He did have decreased heart rate and cardiology is aware.  On April 07, 2011, Surgery signed off.  Cardiology saw him and because he is back in the sinus rhythm the Toprol was decreased, but they said to continue Coumadin at this current time.  My goal is if he has a bowel movement and appropriate, he can go home with appropriate outpatient followup.  INR on D/C was 1.26     Gwen Pounds, MD     JMR/MEDQ  D:  04/07/2011  T:  04/07/2011  Job:  [id number]  cc:   Hillis Range, MD Barry Dienes. Eloise Harman, M.D. Petra Kuba, M.D.  Electronically Signed by Creola Corn MD on 04/07/2011 02:33:52 PM

## 2011-04-15 ENCOUNTER — Telehealth: Payer: Self-pay | Admitting: Internal Medicine

## 2011-04-15 NOTE — Telephone Encounter (Signed)
Per pt wife calling was recently D/C from hospital and now pt BP is low, 112/64, pt HR was 58. Pt also said toprol was havled today due to pt having a low BP.

## 2011-04-15 NOTE — Telephone Encounter (Signed)
PCP decreased  To 12.5mg  bid

## 2011-04-15 NOTE — Consult Note (Signed)
[name], [name]               ACCOUNT NO.:  [id number]  MEDICAL RECORD NO.:  [id number]  LOCATION:  2101                         FACILITY:  [facility]  PHYSICIAN:  Gabrielle Dare. Janee Morn, M.D.DATE OF BIRTH:  1934-07-30  DATE OF CONSULTATION:  04/03/2011 DATE OF DISCHARGE:                                CONSULTATION   REFERRING PHYSICIAN:  Hillis Range, MD  PRIMARY CARE:  Dr. Jarold Motto.  REASON FOR CONSULTATION:  Abdominal distention.  BRIEF HISTORY:  The patient is a 77 year old gentleman who has had several months of abdominal distention, the longer you talk about it the longer it lasts and probably goes back a year which he reports occurred after banding of his hemorrhoids by Dr. Kinnie Scales.  It has apparently progressively gotten worse and the last three to four days it is the worst.  He reports a long history of constipation.  He was treated with MiraLax by Dr. Jarold Motto 3-4 months ago and discontinued it because it was not working.  He does not really complain of abdominal pain.  He says he has occasional "gas buildup pain."  He reports some weight loss over the last 2 weeks but it sounds like no more than a couple of pounds.  He has had no nausea or vomiting.  He does not appear acutely ill.  The patient presented to the ER at [facility] yesterday at the urging of his wife.  He had been complaining of lethargy, no energy since his V-tach ablation in August 2011.  He complains of increasing weakness, staying in bed.  He has occasional episodes of tachycardia, about one per month, but he is only aware of them lasting for 1-2 minutes and denies any chest pain with it.  Yesterday, he was actually feeling good.  He had driven to [place], [place], day before and yesterday drove to [place] to pick up a car there.  En route, he developed shakes which lasted approximately 1 hour and they got better. He completed picking up a car and driving it back to [place] but continued to  feel bad and ultimately presented to the ER at [facility] on April 02, 2011.  Workup at [facility] shows a white count of 12,900, hematocrit of 36.9, platelets 179,000.  Sodium is 133, potassium is 3.6, chloride is 97, CO2 is 25, BUN is 18, creatinine is 0.89, glucose is 100.  CK was 122, CK-MB is 4.9, troponin was 2.14.  A chest x-ray of the abdomen shows small bowel distention with fluid levels suggesting an ileus versus distal small bowel obstruction and a small right pleural effusion.  CT was then obtained which shows focally inflamed segment of small bowel within the right lower abdomen which is nonspecific.  There was a question of infection versus ischemic versus small bowel obstruction.  There is also a small right pleural effusion.  There is an 8-mm hypodense area within the pancreas, nonspecific although possibility of the side branch intraductal papillary mucinous neoplasm and they recommended MRI and/or MRCP.  The bladder was also thickened, nonspecific but there was concern about secondary to chronic partialobstruction and he had an enlarged prostate.  There is also a 1-cm density in  the left kidney which is present since 2006 which could not be further characterized.  PAST MEDICAL HISTORY: 1. Hypertension. 2. Dyslipidemia. 3. Obstructive sleep apnea with CPAP. 4. Obesity.  Height 70 inches, last weight was 210 pounds. 5. History of gout. 6. History of NSVT with noncritical coronary artery disease, EF of 55-     60%, in August 2011.  VT ablation on May 29, 2010. 7. History of a paralyzed right hemidiaphragm. 8. COPD with a history of tobacco use.  PAST SURGICAL HISTORY: 1. Laminectomy, December 07, 2010. 2. Cholecystectomy in 1990. 3. Appendectomy in 1978. 4. Hemorrhoid banding 1 year ago, Dr. Kinnie Scales.  FAMILY HISTORY:  Father died with a brain aneurysm.  Mother died of breast cancer.  No siblings and one daughter with breast cancer.  SOCIAL HISTORY:  Tobacco three  packs a day for 20 years, none since 1984.  Alcohol none.  Drugs none.  He is married and retired.  REVIEW OF SYSTEMS:  Positive currently for depression.  Fevers:  None. CEREBROVASCULAR:  No headaches, dizziness, syncope, stroke, or seizure. PULMONARY:  Positive for dyspnea on exertion.  He uses CPAP each night. CARDIAC:  No chest pain.  No palpitations currently or prior to admission or any recent history.  GASTROINTESTINAL:  Negative for nausea and vomiting.  Positive for occasional GERD.  Positive for constipation. Positive for abdominal swelling.  No diarrhea.  No blood in his stool. GENITOURINARY:  He complains of slow urine output.  LOWER EXTREMITIES: Negative for edema, claudication.  Possibly, he says he has problems with right leg sciatica which has never resolved.  MUSCULOSKELETAL: Positive for arthritis.  MEDICATIONS: 1. Lisinopril 40 mg daily. 2. Doxazosin 4 mg daily. 3. Lovastatin 40 mg daily. 4. Allopurinol 300 mg daily. 5. Trazodone 100 mg nightly. 6. Verapamil SR 240 mg daily.  ALLERGIES:  None known.  He is sensitive to BETA-BLOCKERS which cause bradycardia.  PHYSICAL EXAMINATION:  GENERAL:  This is a well-nourished, well- developed white male, in no acute distress and somewhat flat affect, appears depressed, is on CPAP. VITAL SIGNS:  He is afebrile.  Temperature is 97.1, blood pressure is 101/63, respiratory rate is 20, sats are 96%.  On admission, he was noted to go from 116/51 to 84/51 and this improved with fluid bolus. HEENT:  Head:  Normocephalic.  Ears, nose, throat, and mouth are within normal limits. NECK:  Trachea is in the midline.  Thyroid is negative. CHEST:  Clear to auscultation.  He is on CPAP.  Respiratory rate is about 20. CARDIAC:  Normal S1 and S2.  Pulses are +2 and equal in the upper and lower extremities. ABDOMEN:  Bowel sounds are normal.  He is distended.  He is nontender. No abscesses, hernia, or masses.  He has a right lower  quadrant and right upper quadrant scars. GENITOURINARY:  Rectal deferred. LYMPHADENOPATHY:  None palpated, cervical, axillary, or femoral. SKIN:  Normal. MUSCULOSKELETAL:  No joint changes. NEUROLOGIC:  No focal deficits. PSYCH:  Appears slightly depressed. EXTREMITIES:  Lower Extremities:  Good proximal and distal pulses.  He has trace edema in both lower extremities.  LABORATORY DATA:  As noted above.  DIAGNOSTICS:  As noted above.  IMPRESSION: 1. Abdominal distention, partial small bowel obstruction, long history     of constipation, last bowel movement 3 days ago. 2. Hypodense area of the pancreas. 3. Troponin elevation. 4. History of non-sustained ventricular tachycardia ablation, normal     ejection fraction and noncritical coronary artery disease, August  2011. 5. History of sleep apnea, on continuous positive airway pressure. 6. Hypertension. 7. Dyslipidemia. 8. History of tobacco use. 9. Paralyzed right hemidiaphragm.  PLAN:  He was just seen by Dr. Jarold Motto who put him on some steroids. Keeping he is having no nausea or vomiting, we would recommend bowel rest for now.  We will review the studies with Dr. Janee Morn with further evaluation as needed.     Eber Hong, P.A.______________________________ Gabrielle Dare Janee Morn, M.D.    WDJ/MEDQ  D:  04/03/2011  T:  04/03/2011  Job:  [id number]  cc:   Hillis Range, MD Dr. Jarold Motto  Electronically Signed by Sherrie George P.A. on 04/09/2011 03:17:52 PM Electronically Signed by Violeta Gelinas M.D. on 04/15/2011 08:22:43 PM

## 2011-05-15 ENCOUNTER — Ambulatory Visit (INDEPENDENT_AMBULATORY_CARE_PROVIDER_SITE_OTHER): Payer: Medicare Other | Admitting: Internal Medicine

## 2011-05-15 ENCOUNTER — Encounter: Payer: Self-pay | Admitting: Internal Medicine

## 2011-05-15 DIAGNOSIS — I10 Essential (primary) hypertension: Secondary | ICD-10-CM

## 2011-05-15 DIAGNOSIS — I4891 Unspecified atrial fibrillation: Secondary | ICD-10-CM

## 2011-05-15 NOTE — Patient Instructions (Signed)
Your physician has recommended you make the following change in your medication:  1) Stop coumadin. 2) Increase metoprolol to 25mg  one tablet by mouth twice daily.  Your physician recommends that you schedule a follow-up appointment in: 3 months.

## 2011-05-15 NOTE — Progress Notes (Signed)
The patient presents today for routine electrophysiology followup.  Since last being seen in our clinic, he was hospitalized for E Coli sepsis and ileus.  I that particular setting he developed atrial fibrillation.  His afib spontaneously terminated.  He agreed to coumadin in the short term only with plans to follow-up here today.  Since his discharge, he has done reasonably well.  He denies any further symptomatic afib.  He has returned to normal activity.  Today, he denies symptoms of palpitations, chest pain, shortness of breath (above baseline), orthopnea, PND, lower extremity edema, dizziness, presyncope, syncope, or neurologic sequela.  The patient feels that he is tolerating medications without difficulties but is very clear that he will not continue coumadin.  He is otherwise without complaint today.   Past Medical History  Diagnosis Date  . Hypercholesteremia   . OSA (obstructive sleep apnea)     compliant with CPAP  . Dyspnea     when supine  . Obesity   . Gout   . PVC (premature ventricular contraction)     s/p PVC ablation 05/29/2010  . HTN (hypertension)   . Coronary artery ectasia     with no stenosis by cath 8/11  . Paroxysmal atrial fibrillation     occured in the setting of acute E Coli sepsis and ileius 7/12   Past Surgical History  Procedure Date  . Cholecystectomy 1990  . Appendectomy 1978  . Poly removed from nose as a child   . Pvc ablation 05/29/2010    Current Outpatient Prescriptions  Medication Sig Dispense Refill  . allopurinol (ZYLOPRIM) 300 MG tablet Take 300 mg by mouth daily.        Marland Kitchen aspirin 81 MG tablet Take 81 mg by mouth daily.        . colchicine 0.6 MG tablet Take 0.6 mg by mouth daily.        Marland Kitchen doxazosin (CARDURA) 4 MG tablet Take 4 mg by mouth at bedtime.        Donald Prose (FLORA-Q) CAPS Take 1 capsule by mouth daily.        Marland Kitchen ipratropium (ATROVENT) 0.03 % nasal spray Place 2 sprays into the nose every 12 (twelve) hours.        Marland Kitchen lisinopril  (PRINIVIL,ZESTRIL) 40 MG tablet Take 40 mg by mouth daily.        Marland Kitchen lovastatin (MEVACOR) 40 MG tablet Take 40 mg by mouth at bedtime.        . traZODone (DESYREL) 100 MG tablet Take 100 mg by mouth at bedtime.        . metoprolol succinate (TOPROL-XL) 25 MG 24 hr tablet Take one tablet by mouth twice daily        No Known Allergies  History   Social History  . Marital Status: Married    Spouse Name: N/A    Number of Children: N/A  . Years of Education: N/A   Occupational History  . Retired    Social History Main Topics  . Smoking status: Former Smoker -- 3.0 packs/day for 20 years    Types: Cigarettes    Quit date: 09/09/1984  . Smokeless tobacco: Not on file  . Alcohol Use: Yes     6 glasses of wine per week  . Drug Use: No  . Sexually Active: Not on file   Other Topics Concern  . Not on file   Social History Narrative  . No narrative on file    Family History  Problem Relation Age of Onset  . Aneurysm Father 65    brain aneurysm  . Breast cancer Mother   . Breast cancer Daughter    Physical Exam: Filed Vitals:   05/15/11 1202  BP: 160/80  Pulse: 75  Height: 5\' 10"  (1.778 m)  Weight: 201 lb (91.173 kg)    GEN- The patient is well appearing, alert and oriented x 3 today.   Head- normocephalic, atraumatic Eyes-  Sclera clear, conjunctiva pink Ears- hearing intact Oropharynx- clear Neck- supple, no JVP Lymph- no cervical lymphadenopathy Lungs- Clear to ausculation bilaterally, normal work of breathing Heart- Regular rate and rhythm, no murmurs, rubs or gallops, PMI not laterally displaced GI- soft, NT, ND, + BS Extremities- no clubbing, cyanosis, or edema MS- no significant deformity or atrophy Skin- no rash or lesion Psych- euthymic mood, full affect Neuro- strength and sensation are intact  ekg today reveals sinus rhythm with PACs and PVCs, otherwise normal ekg  Assessment and Plan:

## 2011-05-15 NOTE — Assessment & Plan Note (Signed)
Mr Keyworth is recovering well following his recent hospitalization for E Coli sepsis and ileus.  In the setting of this acute medical illness, he developed atrial fibrillation.  He has not had afib prior to or since this event.  I have recommended that he continue coumadin long term given CHADS2 score of 2 (age, HTN).  He is very clear however that he will stop coumadin at this time.  He will therefore resume ASA.  He will consider coumadin, pradaxa, or xarelto if his afib returns.  In the interim, I have increased metoprolol to 25mg  BID today.

## 2011-05-15 NOTE — Assessment & Plan Note (Signed)
Above goal today Increase metoprolol Follow-up with PCP 2 gram sodium restriction

## 2011-05-22 NOTE — Consult Note (Signed)
[name], [name] NO.:  [id number]  MEDICAL RECORD NO.:  [id number]  LOCATION:  2101                         FACILITY:  [facility]  PHYSICIAN:  Graylin Shiver, M.D.   DATE OF BIRTH:  15-Sep-1933  DATE OF CONSULTATION:  04/04/2011 DATE OF DISCHARGE:                                CONSULTATION   REASON FOR CONSULTATION:  The patient is a 77 year old white male who was admitted to the hospital and is being treated because of the small bowel obstruction and urinary sepsis.  He had a CT scan of the abdomen and pelvis done yesterday which showed a focally inflamed segment of small bowel in the right lower quadrant with the differential diagnosis including infectious, inflammatory, or ischemic etiologies.  The patient has been treated conservatively and followed by the surgical service and he seems to clinically be improving.  He has less abdominal discomfort today and is passing flatus.  Clear liquids have been instituted today and he seems to be tolerating them fine without vomiting or abdominal distention.  He seems to clinically be improving from this small bowel obstruction.  He has had a prior cholecystectomy and appendectomy. However, this does not appear to be an obstruction from adhesions.  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  The patient states that he has an appointment in our office with Dr. Ewing Schlein on April 16, 2011.  I did not look at our office records to confirm this.  PAST MEDICAL HISTORY: 1. COPD. 2. Hypertension. 3. Dyslipidemia. 4. Sleep apnea and he wears a CPAP. 5. History of gout. 6. History of coronary artery disease. 7. History of a paralyzed right hemidiaphragm.  SOCIAL HISTORY:  Used to smoke cigarettes heavily, does not drink alcohol.  MEDICATIONS PRIOR TO  ADMISSION:  Lisinopril, doxazosin, lovastatin, allopurinol, trazodone, verapamil.  ALLERGIES:  None known, but he is sensitive to BETA-BLOCKERS which cause bradycardia.  PHYSICAL EXAMINATION:  GENERAL:  He is alert and oriented and does not appear in any acute distress.  He is nonicteric. HEART:  Regular rhythm.  No murmurs. LUNGS:  Clear. ABDOMEN:  Bowel sounds are present.  It is soft, not distended, and nontender.  IMPRESSION: 1. Partial small bowel obstruction which seems to be improving.  The     etiology of this appears to be either infectious, inflammatory, or     ischemic. 2. Hypodense lesion in the pancreas which was described on CT scan and     is felt by Radiology to be a side branch intraductal papillary     mucinous neoplasm and it was recommended to follow up with an     MRI/MRCP in 1 year. 3. Urosepsis for multiple other medical problems.  PLAN:  I would recommend continuing with current management for his small bowel obstruction as is being done by the Medicine Service and Surgery.  He seems to be improving clinically.  In regards to the lesion in the pancreas, he has an appointment to follow up with  Dr. Ewing Schlein according to what he is telling me and I think that this can be followed up either with a followup MRI or even consideration of an endoscopic ultrasound in the future.          ______________________________ Graylin Shiver, M.D.     SFG/MEDQ  D:  04/04/2011  T:  04/05/2011  Job:  [id number]  cc:   Vania Rea. Jarold Motto, MD, Clementeen Graham, FACP, FAGA Gabrielle Dare Janee Morn, M.D. Petra Kuba, M.D.  Electronically Signed by Herbert Moors MD on 05/22/2011 08:49:17 AM

## 2011-05-25 NOTE — H&P (Signed)
[name], SUAREZ NO.:  [id number]  MEDICAL RECORD NO.:  [id number]  LOCATION:  [facility]                         FACILITY:  [facility]  PHYSICIAN:  Marca Ancona, MD      DATE OF BIRTH:  16-Jun-1934  DATE OF ADMISSION:  04/02/2011 DATE OF DISCHARGE:                             HISTORY & PHYSICAL   PRIMARY CARDIOLOGIST:  Hillis Range, MD  PRIMARY CARE PHYSICIAN:  Barry Dienes. Eloise Harman, MD  HISTORY OF PRESENT ILLNESS:  This is a 77 year old with history of symptomatic PVCs status post ablation and left heart catheterization with diffuse coronary ectasia, but no significant stenosis in August of 2011, who presents now with profound weakness and alternating diaphoresis and chills.  For 2 months the patient has had "no energy" since, felt bloated with mild diffuse abdominal pain.  He has been constipated.  His last bowel movement was 2 days ago.  For the last 2 weeks, the patient has had on and off shaking chills.  Yesterday, he felt weak and did not get out of bed.  Today, he felt better and drove to [place] back.  Upon return to [place], he appeared very pale and weak to his family.  He got in bed and was profoundly diaphoretic.  They had him brought into the emergency department.  Here in the ER, systolic blood pressure was briefly in the 80s, but rose to the 110s to 120s rather particularly with an IV fluid bolus.  The patient's troponin was noted to be positive.  Abdominal plain films showed fluid levels suggesting ileus versus distal small bowel obstruction.  The patient himself denies chest pain and shortness of breath or cough.  He does have baseline orthopnea and wears a CPAP any time he lies down.  MEDICATIONS: 1. Lisinopril 40 mg daily. 2. Doxazosin 4 mg daily. 3. Lovastatin 40 mg daily. 4. Allopurinol 300 mg daily. 5. Aspirin 81 mg daily 6. Trazodone 100 mg bedtime. 7. Verapamil SR 240 mg daily.  PAST MEDICAL HISTORY: 1. History of laminectomy. 2.  Hypertension. 3. Hyperlipidemia. 4. Obstructive sleep apnea on CPAP. 5. Obesity. 6. Gout. 7. History of cholecystectomy. 8. History of appendectomy. 9. Paralyzed right hemidiaphragm. 10.PVCs and nonsustained ventricular tachycardia that has been     symptomatic.  The patient had 20% total burden of ventricular beats     on the Holter monitor.  He had a PVC ablation that was successful     in September 2011.  The patient had left heart catheterization in     August of 2011, showing mild diffuse coronary ectasia and no     significant stenosis, EF of 60-70% with normal wall motion.  Echo     in 2011, showed EF 55-60%, left atrial enlargement, and mild LVH.  SOCIAL HISTORY:  The patient lives in [place] with his wife.  He is retired.  He quit smoking 25 years ago.  FAMILY HISTORY:  Father had a brain aneurysm.  REVIEW OF SYSTEMS:  All systems were reviewed and were negative except as noted in history present illness.  PHYSICAL EXAMINATION:  VITAL SIGNS:  Temperature is 98.8, blood pressure initially 116/51, and fell to 82/54 with IV fluid,  systolic blood pressure rose from 110s to 120s, oxygen saturation is 97% on room air. GENERAL:  This is a very weak-appearing male in no apparent distress. HEENT:  Normal exam. ABDOMEN:  Soft, mildly distended.  There is no significant tenderness. NECK:  There is no thyromegaly or thyroid nodule.  JVP appears to be elevated about 10 cm of water. CARDIOVASCULAR:  Heart regular, S1 and S2.  No S3, no S4.  There is no murmur.  There are 2+ posterior tibial pulses bilaterally.  There is no carotid bruit.  There is 1+ ankle edema and some lower leg varicosities. EXTREMITIES:  No clubbing or cyanosis. LUNGS:  There are decreased breath sounds at the bases bilaterally, right greater than left. NEUROLOGIC:  Alert and oriented x3.  Normal affect.  RADIOLOGY:  Abdominal film shows small bowel distention with fluid levels suggesting ileus versus  distal small-bowel obstruction.  There is a small right pleural effusion and there is bilateral atelectasis.  EKG shows normal sinus rhythm with PVCs.  LABORATORY DATA:  White count 12.9 with 93% neutrophils, hematocrit 36.9, platelets 179, lactate 1.1, potassium 3.6, creatinine 0.89, AST and ALT are  normal.  CK 122, CK-MB 2.9, and troponin 2.14.  IMPRESSION:  This is 77 year old with history of symptomatic premature ventricular contractions status post ablation and catheterization without significant coronary artery disease in August 2011, who presented with weakness, chills, and abdominal bloating.  The patient was also found to have elevated troponin. 1. Weakness, this is generalized and is associated with alternating     chills/diaphoresis.  He is not febrile.  I have concern for     infection especially given the hypotension initially.  He has no     definite source.  He does have abdominal discomfort and distention,     but is not tender.  There is no pneumonia on chest x-ray.  We will     get blood cultures x2 and abdominal CT.  We will check a     procalcitonin and a urinalysis.  I will start him on broad-spectrum     antibiotics given his hypotension and questionable source of     infection.  I will use vancomycin and Zosyn for now. 2. Elevated troponin.  The patient has had no chest pain, he has had     only generalized weakness.  He had heart catheterization in August     2011, without obstructive disease.  I am most concerned this is     elevated in the setting of sepsis/hypotension versus congestive     heart failure rather than true acute coronary syndrome with plaque     rupture.  I think it would be more likely to be demand ischemia.  I     will get an echocardiogram.  We will cycle cardiac enzymes to peak     and cycle EKGs.  So far there has been no change in his EKG.  We     will put him on aspirin and heparin drip for now. 3. Congestive heart failure.  The patient  does appear volume     overloaded on exam with elevated neck veins, it is     not inconceivable at this presentation with abdominal bloating,     weakness, and elevated troponin could be profound congestive heart     failure.  We will get a repeat echo as above.  Check a BMP and we     will hold off on any further IV fluid  at this time.     Marca Ancona, MD     DM/MEDQ  D:  04/03/2011  T:  04/03/2011  Job:  [id number]  Electronically Signed by Marca Ancona MD on 05/25/2011 10:20:05 PM

## 2011-08-26 ENCOUNTER — Ambulatory Visit (INDEPENDENT_AMBULATORY_CARE_PROVIDER_SITE_OTHER): Payer: Medicare Other | Admitting: Internal Medicine

## 2011-08-26 ENCOUNTER — Encounter: Payer: Self-pay | Admitting: Internal Medicine

## 2011-08-26 DIAGNOSIS — I10 Essential (primary) hypertension: Secondary | ICD-10-CM

## 2011-08-26 DIAGNOSIS — R0602 Shortness of breath: Secondary | ICD-10-CM

## 2011-08-26 DIAGNOSIS — I4891 Unspecified atrial fibrillation: Secondary | ICD-10-CM

## 2011-08-26 LAB — BASIC METABOLIC PANEL
BUN: 29 mg/dL — ABNORMAL HIGH (ref 6–23)
CO2: 29 mEq/L (ref 19–32)
Calcium: 8.9 mg/dL (ref 8.4–10.5)
Chloride: 107 mEq/L (ref 96–112)
Creatinine, Ser: 0.8 mg/dL (ref 0.4–1.5)
GFR: 99.98 mL/min (ref >60.00)
Glucose, Bld: 100 mg/dL — ABNORMAL HIGH (ref 70–99)
Potassium: 4 mEq/L (ref 3.5–5.1)
Sodium: 143 mEq/L (ref 135–145)

## 2011-08-26 MED ORDER — SPIRONOLACTONE 25 MG PO TABS: ORAL_TABLET | ORAL | Status: DC

## 2011-08-26 NOTE — Progress Notes (Signed)
The patient presents today for routine electrophysiology followup.  Since last being seen in our clinic, he has done well.  He denies any further symptomatic afib.  He has returned to normal activity.  He has stable dyspnea and fatigue.  Today, he denies symptoms of palpitations, chest pain, shortness of breath (above baseline), orthopnea, PND, lower extremity edema, dizziness, presyncope, syncope, or neurologic sequela.  The patient feels that he is tolerating medications without difficulties but is very clear that he will not take coumadin.  He is otherwise without complaint today.   Past Medical History  Diagnosis Date  . Hypercholesteremia   . OSA (obstructive sleep apnea)     compliant with CPAP  . Dyspnea     when supine  . Obesity   . Gout   . PVC (premature ventricular contraction)     s/p PVC ablation 05/29/2010  . HTN (hypertension)   . Coronary artery ectasia     with no stenosis by cath 8/11  . Paroxysmal atrial fibrillation     occured in the setting of acute E Coli sepsis and ileius 7/12  . Diaphragmatic paralysis     felt to be partially responsible for dyspnea   Past Surgical History  Procedure Date  . Cholecystectomy 1990  . Appendectomy 1978  . Poly removed from nose as a child   . Pvc ablation 05/29/2010    Current Outpatient Prescriptions  Medication Sig Dispense Refill  . allopurinol (ZYLOPRIM) 300 MG tablet Take 300 mg by mouth daily.        Marland Kitchen aspirin 81 MG tablet Take 81 mg by mouth daily.        Marland Kitchen doxazosin (CARDURA) 4 MG tablet Take 4 mg by mouth at bedtime.        Donald Prose (FLORA-Q) CAPS Take 1 capsule by mouth daily.        Marland Kitchen ipratropium (ATROVENT) 0.03 % nasal spray Place 2 sprays into the nose every 12 (twelve) hours.        Marland Kitchen lisinopril (PRINIVIL,ZESTRIL) 40 MG tablet Take 20 mg by mouth daily.       Marland Kitchen lovastatin (MEVACOR) 40 MG tablet Take 40 mg by mouth at bedtime.        . metoprolol succinate (TOPROL-XL) 25 MG 24 hr tablet Take one tablet by  mouth twice daily      . traZODone (DESYREL) 100 MG tablet Take 100 mg by mouth at bedtime.          No Known Allergies  History   Social History  . Marital Status: Married    Spouse Name: N/A    Number of Children: N/A  . Years of Education: N/A   Occupational History  . Retired    Social History Main Topics  . Smoking status: Former Smoker -- 3.0 packs/day for 20 years    Types: Cigarettes    Quit date: 09/09/1984  . Smokeless tobacco: Not on file  . Alcohol Use: Yes     6 glasses of wine per week  . Drug Use: No  . Sexually Active: Not on file   Other Topics Concern  . Not on file   Social History Narrative  . No narrative on file    Family History  Problem Relation Age of Onset  . Aneurysm Father 65    brain aneurysm  . Breast cancer Mother   . Breast cancer Daughter    Physical Exam: Filed Vitals:   08/26/11 0931  BP: 174/80  Pulse: 61  Height: 5\' 10"  (1.778 m)  Weight: 211 lb (95.709 kg)    GEN- The patient is well appearing, alert and oriented x 3 today.   Head- normocephalic, atraumatic Eyes-  Sclera clear, conjunctiva pink Ears- hearing intact Oropharynx- clear Neck- supple, no JVP Lymph- no cervical lymphadenopathy Lungs- Clear to ausculation bilaterally, normal work of breathing Heart- Regular rate and rhythm, no murmurs, rubs or gallops, PMI not laterally displaced GI- soft, NT, ND, + BS Extremities- no clubbing, cyanosis, or edema MS- no significant deformity or atrophy Skin- no rash or lesion Psych- euthymic mood, full affect Neuro- strength and sensation are intact  ekg today reveals sinus rhythm  61 bpm, nonspecific St/T changes  Assessment and Plan:

## 2011-08-26 NOTE — Assessment & Plan Note (Signed)
Maintaining sinus rhythm He declines coumadin

## 2011-08-26 NOTE — Assessment & Plan Note (Signed)
Chronic stable dyspnea for which he has previously been evaluated by Dr Keene Breath to be largely due to diaphragmatic paralysis Regular exercise/ activity encouraged

## 2011-08-26 NOTE — Assessment & Plan Note (Signed)
Above goal today Add spironolactone 12.5mg  daily (can be uptitrated) Check BMET today Return in 2 months for BMET and further management by Norma Fredrickson

## 2011-08-26 NOTE — Patient Instructions (Addendum)
Your physician recommends that you schedule a follow-up appointment in: 2 months with Norma Fredrickson, NP and 4 months with Dr Johney Frame  Your physician has recommended you make the following change in your medication:  1) Start Spironalactone 12.5mg  daily(1/2 of a 25 mg tablet daily) Your physician recommends that you return for lab work today  BMP

## 2011-09-13 ENCOUNTER — Other Ambulatory Visit: Payer: Self-pay | Admitting: Cardiology

## 2011-10-14 ENCOUNTER — Encounter: Payer: Self-pay | Admitting: Nurse Practitioner

## 2011-10-14 ENCOUNTER — Ambulatory Visit (INDEPENDENT_AMBULATORY_CARE_PROVIDER_SITE_OTHER): Payer: Medicare Other | Admitting: Nurse Practitioner

## 2011-10-14 ENCOUNTER — Telehealth: Payer: Self-pay | Admitting: Nurse Practitioner

## 2011-10-14 VITALS — BP 160/80 | HR 58 | Ht 70.0 in | Wt 211.0 lb

## 2011-10-14 DIAGNOSIS — I10 Essential (primary) hypertension: Secondary | ICD-10-CM

## 2011-10-14 DIAGNOSIS — I4891 Unspecified atrial fibrillation: Secondary | ICD-10-CM

## 2011-10-14 DIAGNOSIS — R5381 Other malaise: Secondary | ICD-10-CM

## 2011-10-14 DIAGNOSIS — R5383 Other fatigue: Secondary | ICD-10-CM | POA: Insufficient documentation

## 2011-10-14 LAB — CBC WITH DIFFERENTIAL/PLATELET
Basophils Absolute: 0 10*3/uL (ref 0.0–0.1)
Basophils Relative: 0.5 % (ref 0.0–3.0)
Eosinophils Absolute: 0.1 10*3/uL (ref 0.0–0.7)
Eosinophils Relative: 1.6 % (ref 0.0–5.0)
HCT: 42.8 % (ref 39.0–52.0)
Hemoglobin: 14.6 g/dL (ref 13.0–17.0)
Lymphocytes Relative: 21.1 % (ref 12.0–46.0)
Lymphs Abs: 1.2 10*3/uL (ref 0.7–4.0)
MCHC: 34.1 g/dL (ref 30.0–36.0)
MCV: 95.6 fl (ref 78.0–100.0)
Monocytes Absolute: 0.4 10*3/uL (ref 0.1–1.0)
Monocytes Relative: 6.8 % (ref 3.0–12.0)
Neutro Abs: 3.9 10*3/uL (ref 1.4–7.7)
Neutrophils Relative %: 70 % (ref 43.0–77.0)
Platelets: 142 10*3/uL — ABNORMAL LOW (ref 150.0–400.0)
RBC: 4.47 Mil/uL (ref 4.22–5.81)
RDW: 15.1 % — ABNORMAL HIGH (ref 11.5–14.6)
WBC: 5.6 10*3/uL (ref 4.5–10.5)

## 2011-10-14 LAB — BASIC METABOLIC PANEL
BUN: 27 mg/dL — ABNORMAL HIGH (ref 6–23)
CO2: 28 mEq/L (ref 19–32)
Calcium: 8.9 mg/dL (ref 8.4–10.5)
Chloride: 106 mEq/L (ref 96–112)
Creatinine, Ser: 0.8 mg/dL (ref 0.4–1.5)
GFR: 101.4 mL/min (ref >60.00)
Glucose, Bld: 97 mg/dL (ref 70–99)
Potassium: 4.2 mEq/L (ref 3.5–5.1)
Sodium: 139 mEq/L (ref 135–145)

## 2011-10-14 LAB — TSH: TSH: 1.66 u[IU]/mL (ref 0.35–5.50)

## 2011-10-14 LAB — TESTOSTERONE: Testosterone: 396.65 ng/dL (ref 350.00–890.00)

## 2011-10-14 MED ORDER — SERTRALINE HCL 50 MG PO TABS: 50.0000 mg | ORAL_TABLET | Freq: Every day | ORAL | Status: DC

## 2011-10-14 NOTE — Assessment & Plan Note (Signed)
Blood pressure is up here. He says he has good control at home. We will recheck his BMET today. I have asked him to bring his cuff in to check for correlation. For now, no change in his medicines.

## 2011-10-14 NOTE — Assessment & Plan Note (Signed)
In sinus today by physical exam. Has declined coumadin in the past.

## 2011-10-14 NOTE — Patient Instructions (Addendum)
We are going to check some labs today.  Stay on your current medicines. We are going to add  Zoloft 50 mg daily.   I will see you in 3 weeks. Bring your blood pressure cuff for me to check.  Call the [facility] office at [phone] if you have any questions, problems or concerns.

## 2011-10-14 NOTE — Assessment & Plan Note (Signed)
This has been a chronic issue. Seems depressed to me and he admits that he may be. We will check labs today. He is agreeable to starting Zoloft 50 mg daily. I will see him back in about 3 weeks to recheck him. Patient is agreeable to this plan and will call if any problems develop in the interim.

## 2011-10-14 NOTE — Telephone Encounter (Signed)
Fu call He was here this morning and was told to call back with this info Lisinopril 40 mg 1 tablet daily

## 2011-10-14 NOTE — Progress Notes (Signed)
[name] Date of Birth: February 16, 1934 Medical Record #[id number]  History of Present Illness: [name] is seen back today for a 2 month check. He is seen for Dr. Johney Frame. He has HTN and has had prior PVC ablation. His ventricular ectopy with runs of NSTVT were originally noted during his sleep study. He had been on Methylphenidate and alcohol and has discontinued. Low dose aldactone was added at his last visit for his blood pressure.  He comes in today. He remains very fatigued. Not very motivated to exercise. Remains short of breath. Has trouble with his CPAP and is trying to get his mask to fit better. No chest pain. Does have some GI issues ever since his ileus back in July. Not lightheaded or dizzy. He is frustrated about his energy level. He checks his blood pressure at home and says his readings are normal. His cuff has not been checked for correlation.   Current Outpatient Prescriptions on File Prior to Visit  Medication Sig Dispense Refill  . allopurinol (ZYLOPRIM) 300 MG tablet Take 300 mg by mouth daily.        Marland Kitchen aspirin 81 MG tablet Take 81 mg by mouth daily.        Marland Kitchen doxazosin (CARDURA) 4 MG tablet TAKE 1 TABLET EVERY DAY (NEW DOSE)  30 tablet  12  . lovastatin (MEVACOR) 40 MG tablet Take 40 mg by mouth at bedtime.        . metoprolol succinate (TOPROL-XL) 25 MG 24 hr tablet Take one tablet by mouth twice daily      . spironolactone (ALDACTONE) 25 MG tablet Take 1/2 tablet by mouth daily  30 tablet  6  . traZODone (DESYREL) 100 MG tablet Take 100 mg by mouth at bedtime.        Marland Kitchen lisinopril (PRINIVIL,ZESTRIL) 40 MG tablet Take 20 mg by mouth daily.       . sertraline (ZOLOFT) 50 MG tablet Take 1 tablet (50 mg total) by mouth daily.  30 tablet  6    No Known Allergies  Past Medical History  Diagnosis Date  . Hypercholesteremia   . OSA (obstructive sleep apnea)     compliant with CPAP  . Dyspnea     due to paralyzed diaphragm and pulmonary issues  . Obesity   . Gout     . PVC (premature ventricular contraction)     s/p PVC ablation 05/29/2010  . HTN (hypertension)   . Coronary artery ectasia     Mild CAD with normal systolic function per cath in August of 2011  . Paroxysmal atrial fibrillation     occured in the setting of acute E Coli sepsis and ileius 7/12  . Diaphragmatic paralysis     felt to be partially responsible for dyspnea    Past Surgical History  Procedure Date  . Cholecystectomy 1990  . Appendectomy 1978  . Poly removed from nose as a child   . Pvc ablation 05/29/2010    History  Smoking status  . Former Smoker -- 3.0 packs/day for 20 years  . Types: Cigarettes  . Quit date: 09/09/1984  Smokeless tobacco  . Not on file    History  Alcohol Use  . Yes    6 glasses of wine per week    Family History  Problem Relation Age of Onset  . Aneurysm Father 65    brain aneurysm  . Breast cancer Mother   . Breast cancer Daughter     Review  of Systems: The review of systems is positive for occasional edema. It will go down overnight. No chest pain. Has chronic dyspnea. Weight is up a little. No recent labs.  All other systems were reviewed and are negative.  Physical Exam: BP 160/80  Pulse 58  Ht 5\' 10"  (1.778 m)  Wt 211 lb (95.709 kg)  BMI 30.28 kg/m2 Patient is very pleasant and in no acute distress. His affect does seem quite flat to me. Skin is warm and dry. Color is normal.  HEENT is unremarkable. Normocephalic/atraumatic. PERRL. Sclera are nonicteric. Neck is supple. No masses. No JVD. Lungs are clear. Cardiac exam shows a regular rate and rhythm. He does have an occasional ectopic. Abdomen is soft. Extremities are without edema. Gait and ROM are intact. No gross neurologic deficits noted.   LABORATORY DATA: PENDING   Assessment / Plan:

## 2011-10-28 ENCOUNTER — Ambulatory Visit: Payer: Medicare Other | Admitting: Nurse Practitioner

## 2011-11-04 ENCOUNTER — Ambulatory Visit (INDEPENDENT_AMBULATORY_CARE_PROVIDER_SITE_OTHER): Payer: Medicare Other | Admitting: Nurse Practitioner

## 2011-11-04 ENCOUNTER — Encounter: Payer: Self-pay | Admitting: Nurse Practitioner

## 2011-11-04 VITALS — BP 198/82 | HR 54 | Ht 70.0 in | Wt 210.0 lb

## 2011-11-04 DIAGNOSIS — I10 Essential (primary) hypertension: Secondary | ICD-10-CM

## 2011-11-04 DIAGNOSIS — G4733 Obstructive sleep apnea (adult) (pediatric): Secondary | ICD-10-CM

## 2011-11-04 DIAGNOSIS — R5383 Other fatigue: Secondary | ICD-10-CM

## 2011-11-04 DIAGNOSIS — R5381 Other malaise: Secondary | ICD-10-CM

## 2011-11-04 LAB — BASIC METABOLIC PANEL
BUN: 30 mg/dL — ABNORMAL HIGH (ref 6–23)
CO2: 29 mEq/L (ref 19–32)
Calcium: 9.1 mg/dL (ref 8.4–10.5)
Chloride: 105 mEq/L (ref 96–112)
Creatinine, Ser: 0.9 mg/dL (ref 0.4–1.5)
GFR: 91.93 mL/min (ref >60.00)
Glucose, Bld: 93 mg/dL (ref 70–99)
Potassium: 4.1 mEq/L (ref 3.5–5.1)
Sodium: 140 mEq/L (ref 135–145)

## 2011-11-04 MED ORDER — SERTRALINE HCL 50 MG PO TABS: ORAL_TABLET | ORAL | Status: DC

## 2011-11-04 MED ORDER — AMLODIPINE BESYLATE 5 MG PO TABS: 5.0000 mg | ORAL_TABLET | Freq: Every day | ORAL | Status: DC

## 2011-11-04 NOTE — Assessment & Plan Note (Signed)
Repeat blood pressures by me are 180/80 in both arms. His machine is reading "ERROR". He will change the batteries. I have added Norvasc 5 mg to his regimen. We do need to check a BMET today. I will see him back on Friday. Patient is agreeable to this plan and will call if any problems develop in the interim.

## 2011-11-04 NOTE — Patient Instructions (Signed)
We are going to check your potassium level today.  We are going to start you on Norvasc 5 mg in addition to your other medicines for your blood pressure.  You may increase your Zoloft to 75 mg daily.  I will see you in a week.   Limit your salt.  Call the [facility] office at [phone] if you have any questions, problems or concerns.

## 2011-11-04 NOTE — Assessment & Plan Note (Signed)
He has had his CPAP mask adjusted and is wearing nightly.

## 2011-11-04 NOTE — Assessment & Plan Note (Signed)
He seems better to me with the addition of Zoloft. We will increase to 75 mg daily.

## 2011-11-04 NOTE — Progress Notes (Signed)
[name] Date of Birth: 05-30-1934 Medical Record #[id number]  History of Present Illness: [name] is seen back today for a 3 week check. He is seen for [name]. He has known mild CAD per cath in 2011. He also has HTN and prior PVC ablation. His ventricular ectopy consisted of runs of NSVT which were noted during his sleep study. He had been on Methylphenidate and alcohol at that time. He is on CPAP. When I saw him 3 weeks ago, he was complaining of significant fatigue and seemed depressed to me. No chest pain. Zoloft was started.   He comes in today. He does think he is feeling a little better. His fatigue may be a bit better. No chest pain. Seems to be a little happier today. Did not tell his wife that he is now on Zoloft. He is interested in increasing the dose. Has not been checking his blood pressure at home and it is elevated here today. He is taking 25 mg of his aldactone and 40 mg of his lisinopril. He has never been on Norvasc to his knowledge.  Current Outpatient Prescriptions on File Prior to Visit  Medication Sig Dispense Refill  . allopurinol (ZYLOPRIM) 300 MG tablet Take 300 mg by mouth daily.        Marland Kitchen aspirin 81 MG tablet Take 81 mg by mouth daily.        Marland Kitchen doxazosin (CARDURA) 4 MG tablet TAKE 1 TABLET EVERY DAY (NEW DOSE)  30 tablet  12  . lisinopril (PRINIVIL,ZESTRIL) 40 MG tablet Take 40 mg by mouth daily.       Marland Kitchen lovastatin (MEVACOR) 40 MG tablet Take 40 mg by mouth at bedtime.        . metoprolol succinate (TOPROL-XL) 25 MG 24 hr tablet Take one tablet by mouth twice daily      . sertraline (ZOLOFT) 50 MG tablet Take 1 tablet (50 mg total) by mouth daily.  30 tablet  6  . traZODone (DESYREL) 100 MG tablet Take 100 mg by mouth at bedtime.        Marland Kitchen DISCONTD: spironolactone (ALDACTONE) 25 MG tablet Take 1/2 tablet by mouth daily  30 tablet  6    No Known Allergies  Past Medical History  Diagnosis Date  . Hypercholesteremia   . OSA (obstructive sleep apnea)       compliant with CPAP  . Dyspnea     due to paralyzed diaphragm and pulmonary issues  . Obesity   . Gout   . PVC (premature ventricular contraction)     s/p PVC ablation 05/29/2010  . HTN (hypertension)   . Coronary artery ectasia     Mild CAD with normal systolic function per cath in August of 2011  . Paroxysmal atrial fibrillation     occured in the setting of acute E Coli sepsis and ileius 7/12  . Diaphragmatic paralysis     felt to be partially responsible for dyspnea    Past Surgical History  Procedure Date  . Cholecystectomy 1990  . Appendectomy 1978  . Poly removed from nose as a child   . Pvc ablation 05/29/2010    History  Smoking status  . Former Smoker -- 3.0 packs/day for 20 years  . Types: Cigarettes  . Quit date: 09/09/1984  Smokeless tobacco  . Not on file    History  Alcohol Use  . Yes    6 glasses of wine per week    Family History  Problem Relation Age of Onset  . Aneurysm Father 65    brain aneurysm  . Breast cancer Mother   . Breast cancer Daughter     Review of Systems: The review of systems is per the HPI.  All other systems were reviewed and are negative.  Physical Exam: BP 198/82  Pulse 54  Ht 5\' 10"  (1.778 m)  Wt 210 lb (95.255 kg)  BMI 30.13 kg/m2 Patient is very pleasant and in no acute distress. Skin is warm and dry. Color is normal.  HEENT is unremarkable. Normocephalic/atraumatic. PERRL. Sclera are nonicteric. Neck is supple. No masses. No JVD. Lungs are clear. Cardiac exam shows a regular rate and rhythm. Abdomen is soft. Extremities are without edema. Gait and ROM are intact. No gross neurologic deficits noted.   LABORATORY DATA: BMET is pending.    Assessment / Plan:

## 2011-11-08 ENCOUNTER — Encounter: Payer: Self-pay | Admitting: Nurse Practitioner

## 2011-11-08 ENCOUNTER — Ambulatory Visit (INDEPENDENT_AMBULATORY_CARE_PROVIDER_SITE_OTHER): Payer: Medicare Other | Admitting: Nurse Practitioner

## 2011-11-08 VITALS — BP 150/80 | HR 60 | Ht 70.0 in | Wt 207.0 lb

## 2011-11-08 DIAGNOSIS — I10 Essential (primary) hypertension: Secondary | ICD-10-CM

## 2011-11-08 NOTE — Progress Notes (Signed)
[name] Date of Birth: November 03, 1933 Medical Record #[id number]  History of Present Illness: [name] is seen today for a follow up visit. He is seen for Dr. Johney Frame. We added Norvasc to his regimen earlier in the week for his uncontrolled HTN. Zoloft was increased for his depression as well. He comes back today for follow up. He feels great. No complaints. Smiling. Blood pressure is better in just a couple of days. He changed the batteries in his machine and his readings are better at home as well.  BUN was elevated at his check earlier in the week. He admits to not drinking enough water.   Current Outpatient Prescriptions on File Prior to Visit  Medication Sig Dispense Refill  . allopurinol (ZYLOPRIM) 300 MG tablet Take 300 mg by mouth daily.        Marland Kitchen amLODipine (NORVASC) 5 MG tablet Take 1 tablet (5 mg total) by mouth daily.  30 tablet  11  . aspirin 81 MG tablet Take 81 mg by mouth daily.        Marland Kitchen doxazosin (CARDURA) 4 MG tablet TAKE 1 TABLET EVERY DAY (NEW DOSE)  30 tablet  12  . lisinopril (PRINIVIL,ZESTRIL) 40 MG tablet Take 40 mg by mouth daily.       Marland Kitchen lovastatin (MEVACOR) 40 MG tablet Take 40 mg by mouth at bedtime.        . metoprolol succinate (TOPROL-XL) 25 MG 24 hr tablet Take one tablet by mouth twice daily      . sertraline (ZOLOFT) 50 MG tablet Increase to 75 mg daily (1 1/2 tablets)  45 tablet  6  . spironolactone (ALDACTONE) 25 MG tablet Take 25 mg by mouth daily.      . traZODone (DESYREL) 100 MG tablet Take 100 mg by mouth at bedtime.          No Known Allergies  Past Medical History  Diagnosis Date  . Hypercholesteremia   . OSA (obstructive sleep apnea)     compliant with CPAP  . Dyspnea     due to paralyzed diaphragm and pulmonary issues  . Obesity   . Gout   . PVC (premature ventricular contraction)     s/p PVC ablation 05/29/2010  . HTN (hypertension)   . Coronary artery ectasia     Mild CAD with normal systolic function per cath in August of 2011    . Paroxysmal atrial fibrillation     occured in the setting of acute E Coli sepsis and ileius 7/12  . Diaphragmatic paralysis     felt to be partially responsible for dyspnea    Past Surgical History  Procedure Date  . Cholecystectomy 1990  . Appendectomy 1978  . Poly removed from nose as a child   . Pvc ablation 05/29/2010    History  Smoking status  . Former Smoker -- 3.0 packs/day for 20 years  . Types: Cigarettes  . Quit date: 09/09/1984  Smokeless tobacco  . Not on file    History  Alcohol Use  . Yes    6 glasses of wine per week    Family History  Problem Relation Age of Onset  . Aneurysm Father 65    brain aneurysm  . Breast cancer Mother   . Breast cancer Daughter     Review of Systems: The review of systems is per the HPI.  All other systems were reviewed and are negative.  Physical Exam: BP 150/80  Pulse 60  Ht  5\' 10"  (1.778 m)  Wt 207 lb (93.895 kg)  BMI 29.70 kg/m2 Patient is very pleasant and in no acute distress. Skin is warm and dry. Color is normal.  HEENT is unremarkable. Normocephalic/atraumatic. PERRL. Sclera are nonicteric. Neck is supple. No masses. No JVD. Lungs are clear. Cardiac exam shows a regular rate and rhythm. Abdomen is soft. Extremities are without edema. Gait and ROM are intact. No gross neurologic deficits noted.    Lab Results  Component Value Date   WBC 5.6 10/14/2011   HGB 14.6 10/14/2011   HCT 42.8 10/14/2011   PLT 142.0* 10/14/2011   GLUCOSE 93 11/04/2011   ALT 28 04/05/2011   AST 32 04/05/2011   NA 140 11/04/2011   K 4.1 11/04/2011   CL 105 11/04/2011   CREATININE 0.9 11/04/2011   BUN 30* 11/04/2011   CO2 29 11/04/2011   TSH 1.66 10/14/2011   INR 1.26 04/07/2011    Assessment / Plan:

## 2011-11-08 NOTE — Assessment & Plan Note (Signed)
Blood pressure is trending down. His machine correlates. He feels good. Will see him back in a month. He will monitor his readings at home. He does not want to recheck his BMET today but will do on his return visit. Patient is agreeable to this plan and will call if any problems develop in the interim.

## 2011-11-08 NOTE — Patient Instructions (Signed)
Continue with your current medicines. Monitor your blood pressure at home.  Record your readings and bring to your next visit. Limit sodium intake. Call for any problems.  I will see you in a month.

## 2011-11-28 ENCOUNTER — Encounter: Payer: Self-pay | Admitting: *Deleted

## 2012-05-02 ENCOUNTER — Encounter (HOSPITAL_BASED_OUTPATIENT_CLINIC_OR_DEPARTMENT_OTHER): Payer: Self-pay | Admitting: *Deleted

## 2012-05-02 ENCOUNTER — Emergency Department (HOSPITAL_BASED_OUTPATIENT_CLINIC_OR_DEPARTMENT_OTHER)
Admission: EM | Admit: 2012-05-02 | Discharge: 2012-05-02 | Disposition: A | Discharge status: Home / Self Care | Payer: Medicare Other | Attending: Emergency Medicine | Admitting: Emergency Medicine

## 2012-05-02 DIAGNOSIS — E669 Obesity, unspecified: Secondary | ICD-10-CM | POA: Insufficient documentation

## 2012-05-02 DIAGNOSIS — E7800 Pure hypercholesterolemia, unspecified: Secondary | ICD-10-CM | POA: Insufficient documentation

## 2012-05-02 DIAGNOSIS — M109 Gout, unspecified: Secondary | ICD-10-CM | POA: Insufficient documentation

## 2012-05-02 DIAGNOSIS — I10 Essential (primary) hypertension: Secondary | ICD-10-CM

## 2012-05-02 DIAGNOSIS — G4733 Obstructive sleep apnea (adult) (pediatric): Secondary | ICD-10-CM | POA: Insufficient documentation

## 2012-05-02 DIAGNOSIS — R040 Epistaxis: Secondary | ICD-10-CM

## 2012-05-02 DIAGNOSIS — I4891 Unspecified atrial fibrillation: Secondary | ICD-10-CM | POA: Insufficient documentation

## 2012-05-02 DIAGNOSIS — Z87891 Personal history of nicotine dependence: Secondary | ICD-10-CM | POA: Insufficient documentation

## 2012-05-02 MED ORDER — LISINOPRIL 10 MG PO TABS: 40.0000 mg | ORAL_TABLET | ORAL | Status: DC

## 2012-05-02 NOTE — ED Provider Notes (Signed)
History  This chart was scribed for Drew Jakes, MD by [name]. This patient was seen in room MH06/MH06 and the patient's care was started at 1820.   CSN: [id number]  Arrival date & time 05/02/12  8657   First MD Initiated Contact with Patient 05/02/12 2032      Chief Complaint  Patient presents with  . Epistaxis   Patient is a 78 y.o. male presenting with nosebleeds. The history is provided by the patient. No language interpreter was used.  Epistaxis  This is a recurrent problem. The current episode started 6 to 12 hours ago. The problem occurs constantly. The problem has been resolved. The bleeding has been from the left nare. Treatments tried: stuffing toilet paper in his nose. The treatment provided no relief. His past medical history is significant for nose-picking.   [name] is a 78 y.o. male who presents to the Emergency Department complaining of left sided epistaxis which began about 6 hours ago lasting about 4 hours. He has a history of nosebleeds with picking at his nose but usually it does not last this long. He tried stuffing his nose with toilet paper and did not improve his symptoms. He has a hole in his nasal septum from a polyp years ago. He denies any chest pain, SOB and HA.  Past Medical History  Diagnosis Date  . Hypercholesteremia   . OSA (obstructive sleep apnea)     compliant with CPAP  . Dyspnea     due to paralyzed diaphragm and pulmonary issues  . Obesity   . Gout   . PVC (premature ventricular contraction)     s/p PVC ablation 05/29/2010  . HTN (hypertension)   . Coronary artery ectasia     Mild CAD with normal systolic function per cath in August of 2011  . Paroxysmal atrial fibrillation     occured in the setting of acute E Coli sepsis and ileius 7/12  . Diaphragmatic paralysis     felt to be partially responsible for dyspnea    Past Surgical History  Procedure Date  . Cholecystectomy 1990  . Appendectomy 1978  . Poly removed  from nose as a child   . Pvc ablation 05/29/2010  . US echocardiography 03-09-2010    EF 55-60%  . Cardiac catheterization 04-30-2010    Left main coronary artery is normal.   . Cardiovascular stress test 03-19-2010    0%    Family History  Problem Relation Age of Onset  . Aneurysm Father 65    brain aneurysm  . Breast cancer Mother   . Breast cancer Daughter     History  Substance Use Topics  . Smoking status: Former Smoker -- 3.0 packs/[name] for 20 years    Types: Cigarettes    Quit date: 09/09/1984  . Smokeless tobacco: Not on file  . Alcohol Use: Yes     6 glasses of wine per week      Review of Systems  Constitutional: Negative for fever and chills.  HENT: Positive for nosebleeds. Negative for sore throat.   Respiratory: Negative for shortness of breath.   Cardiovascular: Negative for chest pain and leg swelling.  Gastrointestinal: Negative for nausea, vomiting and abdominal pain.  Neurological: Negative for syncope, weakness, light-headedness and headaches.  All other systems reviewed and are negative.    Allergies  Review of patient's allergies indicates no known allergies.  Home Medications   Current Outpatient Rx  Name Route Sig Dispense Refill  .  ALLOPURINOL 300 MG PO TABS Oral Take 300 mg by mouth daily.      . ASPIRIN 81 MG PO TABS Oral Take 81 mg by mouth daily.      Marland Kitchen DOXAZOSIN MESYLATE 4 MG PO TABS  TAKE 1 TABLET EVERY [name] (NEW DOSE) 30 tablet 12  . IBUPROFEN 200 MG PO TABS Oral Take 200-400 mg by mouth every 6 (six) hours as needed. For pain    . LISINOPRIL 40 MG PO TABS Oral Take 40 mg by mouth daily.     Marland Kitchen LOVASTATIN 40 MG PO TABS Oral Take 40 mg by mouth at bedtime.      Marland Kitchen METOPROLOL SUCCINATE ER 25 MG PO TB24 Oral Take by mouth 2 (two) times daily.     Marland Kitchen PROBIOTIC DAILY PO Oral Take 1 capsule by mouth daily.    . TRAZODONE HCL 100 MG PO TABS Oral Take 100 mg by mouth at bedtime.        Triage Vitals: BP 219/99  Pulse 60  Temp 97.8 F (36.6 C)  (Oral)  Resp 18  Ht 5\' 10"  (1.778 m)  Wt 210 lb (95.255 kg)  BMI 30.13 kg/m2  SpO2 93%  Physical Exam  Nursing note and vitals reviewed. Constitutional: He is oriented to person, place, and time. He appears well-developed and well-nourished. No distress.  HENT:  Head: Normocephalic and atraumatic.       Dried blood in left nares. Little expose blood vessel left anterior nares. No active bleeding. Defect in septum with little tried blood in right nares. No blood in his throat.  Eyes: EOM are normal.  Neck: Neck supple. No tracheal deviation present.  Cardiovascular: Normal rate, regular rhythm and normal heart sounds.   No murmur heard. Pulmonary/Chest: Effort normal and breath sounds normal. No respiratory distress. He has no wheezes. He has no rales.  Abdominal: Soft. Bowel sounds are normal. He exhibits no distension. There is no tenderness. There is no rebound and no guarding.  Musculoskeletal: Normal range of motion.  Neurological: He is alert and oriented to person, place, and time.  Skin: Skin is warm and dry.  Psychiatric: He has a normal mood and affect. His behavior is normal.    ED Course  Procedures (including critical care time) DIAGNOSTIC STUDIES: Oxygen Saturation is 96% on room air, normal by my interpretation.    COORDINATION OF CARE: At 900 PM Discussed treatment plan with patient which includes monitoring his blood pressure and avoid picking at his nose and apply squeezing pressure if his nose starts to bleed again. Patient agrees.   Labs Reviewed - No data to display No results found.   1. Epistaxis   2. Hypertension       MDM  Patient with a history of hypertension. Blood pressures been well controlled in the past. Blood pressure elevated here this evening but he has not had his evening blood pressure meds yet. Patient had onset of the nosebleed somewhere around 2:30 while driving it drifts and bled on and off for about 4 hours has now stopped. Patient  is completely asymptomatic no headache no chest pain no shortness of breath no lightheadedness or stroke symptoms. The bleeding has stopped and is predominantly from the left nares there is old dried blood there he does have a septal perforation is visible at its old. Again no active bleeding no blood going down the back of the neck. Patient did not try anytingling the bleeding occurred he's been instructed on how  to do that. Also will go home and take his blood pressure medicine the record his blood pressure daily but she's able to do date in time it and followup with his primary care doctor with the results of that to see if he needs any adjustment to his blood pressure medicine. No evidence of a hypertensive emergency here today no adjustment of blood pressure required acutely.   I personally performed the services described in this documentation, which was scribed in my presence. The recorded information has been reviewed and considered.           Drew Jakes, MD 05/02/12 2130

## 2012-05-02 NOTE — ED Notes (Signed)
Pt states he has had nosebleeds before, but not lasting 4 hours.

## 2012-05-02 NOTE — ED Notes (Signed)
Pt PCP entered an order for HTN medication to be administered in ED. This RN consulted with EDP and V/O TO DC medication was given. Pt will be DCed home where he will take his BP medication and monitor BP. Pt has been given instructions and symptoms  to return to ED if he becomes symptomatic.

## 2012-05-02 NOTE — ED Notes (Signed)
Dr. Timothy Lasso called to inquire about patient, spoke with nurse.

## 2012-05-05 ENCOUNTER — Other Ambulatory Visit: Payer: Self-pay | Admitting: Cardiology

## 2012-06-03 ENCOUNTER — Other Ambulatory Visit: Payer: Self-pay | Admitting: Gastroenterology

## 2012-06-03 DIAGNOSIS — R143 Flatulence: Secondary | ICD-10-CM

## 2012-06-03 DIAGNOSIS — R935 Abnormal findings on diagnostic imaging of other abdominal regions, including retroperitoneum: Secondary | ICD-10-CM

## 2012-06-03 DIAGNOSIS — R141 Gas pain: Secondary | ICD-10-CM

## 2012-06-09 ENCOUNTER — Ambulatory Visit
Admission: RE | Admit: 2012-06-09 | Discharge: 2012-06-09 | Disposition: A | Discharge status: Home / Self Care | Payer: Medicare Other | Source: Ambulatory Visit | Attending: Gastroenterology | Admitting: Gastroenterology

## 2012-06-09 DIAGNOSIS — R935 Abnormal findings on diagnostic imaging of other abdominal regions, including retroperitoneum: Secondary | ICD-10-CM

## 2012-06-09 DIAGNOSIS — R141 Gas pain: Secondary | ICD-10-CM

## 2012-06-09 DIAGNOSIS — R142 Eructation: Secondary | ICD-10-CM

## 2012-06-09 MED ORDER — IOHEXOL 300 MG/ML  SOLN
125.0000 mL | Freq: Once | INTRAMUSCULAR | Status: AC | PRN
  Administered 2012-06-09: 125 mL via INTRAVENOUS

## 2012-07-27 IMAGING — CR DG LUMBAR SPINE 1V
1 series · 1 of 1 positions shown · non-contrast
Comparison: MRI 09/20/2010

CLINICAL DATA: Intraoperative localization.

LUMBAR SPINE - 1 VIEW

[view not recorded]
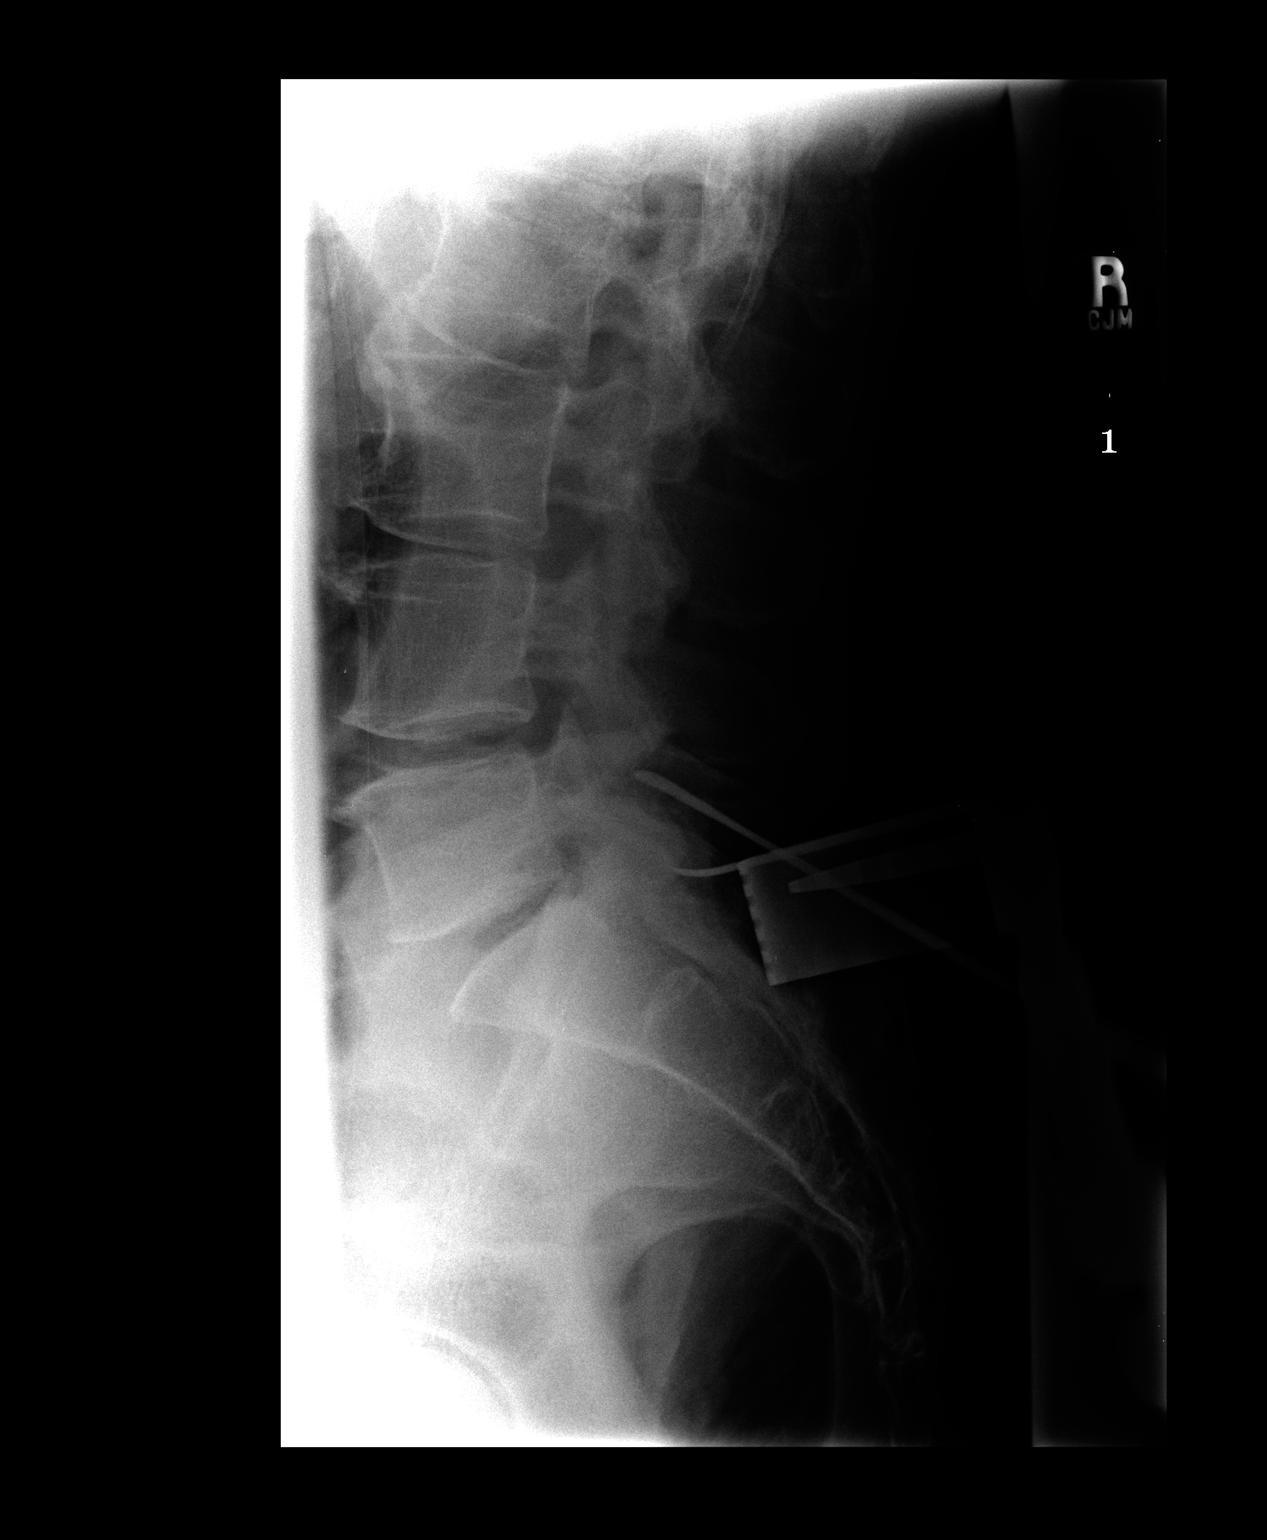

[1 of 1 positions shown; findings below may reference images not displayed]

FINDINGS: Film is labeled 0770 hours and demonstrates a posterior
retractor in place.  Surgical instrument overlies the posterior
elements of L5 and S1.
IMPRESSION: Intraoperative localization.

## 2012-10-13 ENCOUNTER — Other Ambulatory Visit: Payer: Self-pay | Admitting: Emergency Medicine

## 2012-10-13 MED ORDER — DOXAZOSIN MESYLATE 4 MG PO TABS: 4.0000 mg | ORAL_TABLET | Freq: Every day | ORAL | Status: DC

## 2012-11-24 ENCOUNTER — Other Ambulatory Visit: Payer: Self-pay | Admitting: Nurse Practitioner

## 2012-11-24 ENCOUNTER — Telehealth: Payer: Self-pay | Admitting: *Deleted

## 2012-11-24 NOTE — Telephone Encounter (Signed)
I refilled pts zoloft today per Norma Fredrickson, NP and the pt needs an appt so I called home number reached pt's wife she gave me pt's cell phone number,  I lmom for pt and told him to call back tomorrow. It has been a year since pt has been seen in the office, when pt calls back I will schedule an appt.

## 2012-11-25 ENCOUNTER — Telehealth: Payer: Self-pay | Admitting: *Deleted

## 2012-11-25 NOTE — Telephone Encounter (Signed)
S/w pt told pt I refilled pt zoloft per Lawson Fiscal and I needed to make an appt to see Lawson Fiscal since pt hasn't been here since 2012,  told pt I could not fill anymore meds till he comes back to see Lawson Fiscal pt stated he is not making an appt at this time. S/w Lawson Fiscal and we will not be filling any more meds until future appt.

## 2013-02-04 ENCOUNTER — Encounter: Payer: Self-pay | Admitting: Cardiology

## 2013-02-11 ENCOUNTER — Other Ambulatory Visit: Payer: Self-pay | Admitting: *Deleted

## 2013-02-11 MED ORDER — DOXAZOSIN MESYLATE 4 MG PO TABS: 4.0000 mg | ORAL_TABLET | Freq: Every day | ORAL | Status: DC

## 2013-02-11 NOTE — Telephone Encounter (Signed)
[place] filled medication.

## 2013-02-11 NOTE — Telephone Encounter (Signed)
Spoke with patient and he says he will have his primary care provider to take care of the medication.  Called pharmacy to cancel rx since patient did not want to make an appointment with provider at [facility] and will let his primary care refill his medication.

## 2013-07-02 ENCOUNTER — Other Ambulatory Visit: Payer: Self-pay | Admitting: Internal Medicine

## 2014-08-16 ENCOUNTER — Ambulatory Visit (INDEPENDENT_AMBULATORY_CARE_PROVIDER_SITE_OTHER)
Admission: RE | Admit: 2014-08-16 | Discharge: 2014-08-16 | Disposition: A | Discharge status: Home / Self Care | Payer: Medicare Other | Source: Ambulatory Visit | Attending: Pulmonary Disease | Admitting: Pulmonary Disease

## 2014-08-16 ENCOUNTER — Encounter: Payer: Self-pay | Admitting: Pulmonary Disease

## 2014-08-16 ENCOUNTER — Encounter (INDEPENDENT_AMBULATORY_CARE_PROVIDER_SITE_OTHER): Payer: Self-pay

## 2014-08-16 ENCOUNTER — Ambulatory Visit (INDEPENDENT_AMBULATORY_CARE_PROVIDER_SITE_OTHER): Payer: Medicare Other | Admitting: Pulmonary Disease

## 2014-08-16 VITALS — BP 132/74 | HR 51 | Temp 97.0°F | Ht 70.0 in | Wt 222.4 lb

## 2014-08-16 DIAGNOSIS — R0609 Other forms of dyspnea: Secondary | ICD-10-CM

## 2014-08-16 DIAGNOSIS — J986 Disorders of diaphragm: Secondary | ICD-10-CM

## 2014-08-16 DIAGNOSIS — R0600 Dyspnea, unspecified: Secondary | ICD-10-CM

## 2014-08-16 NOTE — Patient Instructions (Signed)
Will check chest xray today, and call you with results. Will schedule for breathing studies at the hospital, and will arrange for followup once the results are available.

## 2014-08-16 NOTE — Assessment & Plan Note (Signed)
The patient is having progressive dyspnea on exertion of unknown origin. 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 I have reviewed the various causes of dyspnea on exertion, including lung disease, cardiac disease, weight and conditioning issues, and other miscellaneous problems such as anemia and hypothyroidism. I would like to check a chest x-ray today, and will also schedule him for full pulmonary function studies with muscle pressures. I will then see him back to discuss the results and to try and come up with the plan to maximize his functional status.

## 2014-08-16 NOTE — Progress Notes (Signed)
   Subjective:    Patient ID: [name], male    DOB: 1933-10-28, 80 y.o.   MRN: [id number]  HPI The pt is a 80y/o male who I have been asked to see for dyspnea on exertion. The patient states he has had breathing issues for at least 2 years, and that his symptoms have been progressive. He describes a 3-4 block dyspnea on exertion at a moderate pace on flat ground, and will get winded walking up moderate hills for one flight of stairs. He also gets short of breath bringing groceries in from the car, or getting in and out of his motor vehicle. The patient has a history of smoking, but has not done so in 25 years. I have seen him in the past for a chronically elevated left hemidiaphragm, and sniff testing shows movement that is not normal but no paralysis. The patient denies any significant cough, and has never had pulmonary function studies. He does tell me that his weight is up over 20 pounds in the last one year. Patient states he has started to get some lower extremity edema at the end of the day, and has had no recent cardiac evaluation. He does have a history of ventricular arrhythmias, and is followed by cardiology.   Review of Systems  Constitutional: Positive for unexpected weight change. Negative for fever.  HENT: Positive for congestion. Negative for dental problem, ear pain, nosebleeds, postnasal drip, rhinorrhea, sinus pressure, sneezing, sore throat and trouble swallowing.   Eyes: Negative for redness and itching.  Respiratory: Positive for shortness of breath. Negative for cough, chest tightness and wheezing.   Cardiovascular: Negative for palpitations and leg swelling.  Gastrointestinal: Negative for nausea and vomiting.  Genitourinary: Negative for dysuria.  Musculoskeletal: Negative for joint swelling.  Skin: Negative for rash.  Neurological: Negative for headaches.  Hematological: Does not bruise/bleed easily.  Psychiatric/Behavioral: Positive for dysphoric mood. The patient  is not nervous/anxious.        Objective:   Physical Exam Constitutional:  Overweight male, no acute distress  HENT:  Nares patent without discharge  Oropharynx without exudate, palate and uvula are elongated  Eyes:  Perrla, eomi, no scleral icterus  Neck:  No JVD, no TMG  Cardiovascular:  Normal rate, regular rhythm, no rubs or gallops.  No murmurs        Intact distal pulses but decreased  Pulmonary :  Decreased depth of inspiration, no stridor or respiratory distress   No rales, rhonchi, or wheezing  Abdominal:  Soft, nondistended, bowel sounds present.  No tenderness noted.   Musculoskeletal:  mild lower extremity edema noted.  Lymph Nodes:  No cervical lymphadenopathy noted  Skin:  No cyanosis noted  Neurologic:  Alert, appropriate, moves all 4 extremities without obvious deficit.         Assessment & Plan:

## 2014-08-25 ENCOUNTER — Ambulatory Visit (HOSPITAL_COMMUNITY)
Admission: RE | Admit: 2014-08-25 | Discharge: 2014-08-25 | Disposition: A | Discharge status: Home / Self Care | Payer: Medicare Other | Source: Ambulatory Visit | Attending: Pulmonary Disease | Admitting: Pulmonary Disease

## 2014-08-25 DIAGNOSIS — R0609 Other forms of dyspnea: Secondary | ICD-10-CM

## 2014-08-25 DIAGNOSIS — R0600 Dyspnea, unspecified: Secondary | ICD-10-CM

## 2014-08-25 LAB — PULMONARY FUNCTION TEST
DL/VA % pred: 56 %
DL/VA: 2.58 ml/min/mmHg/L
DLCO unc % pred: 16 %
DLCO unc: 5.24 ml/min/mmHg
FEF 25-75 Post: 0.44 L/sec
FEF 25-75 Pre: 0.34 L/sec
FEF2575-%Change-Post: 29 %
FEF2575-%Pred-Post: 21 %
FEF2575-%Pred-Pre: 16 %
FEV1-%Change-Post: 13 %
FEV1-%Pred-Post: 35 %
FEV1-%Pred-Pre: 31 %
FEV1-Post: 1.04 L
FEV1-Pre: 0.92 L
FEV1FVC-%Change-Post: 10 %
FEV1FVC-%Pred-Pre: 76 %
FEV6-%Change-Post: 1 %
FEV6-%Pred-Post: 41 %
FEV6-%Pred-Pre: 40 %
FEV6-Post: 1.58 L
FEV6-Pre: 1.56 L
FEV6FVC-%Change-Post: 0 %
FEV6FVC-%Pred-Post: 98 %
FEV6FVC-%Pred-Pre: 98 %
FVC-%Change-Post: 2 %
FVC-%Pred-Post: 42 %
FVC-%Pred-Pre: 41 %
FVC-Post: 1.72 L
FVC-Pre: 1.68 L
Post FEV1/FVC ratio: 61 %
Post FEV6/FVC ratio: 92 %
Pre FEV1/FVC ratio: 55 %
Pre FEV6/FVC Ratio: 93 %
RV % pred: 105 %
RV: 2.78 L
TLC % pred: 63 %
TLC: 4.51 L

## 2014-08-25 MED ORDER — ALBUTEROL SULFATE (2.5 MG/3ML) 0.083% IN NEBU
2.5000 mg | INHALATION_SOLUTION | Freq: Once | RESPIRATORY_TRACT | Status: AC
  Administered 2014-08-25: 2.5 mg via RESPIRATORY_TRACT

## 2014-08-26 ENCOUNTER — Encounter: Payer: Self-pay | Admitting: Internal Medicine

## 2014-09-01 ENCOUNTER — Encounter: Payer: Self-pay | Admitting: Pulmonary Disease

## 2014-09-01 ENCOUNTER — Ambulatory Visit (INDEPENDENT_AMBULATORY_CARE_PROVIDER_SITE_OTHER): Payer: Medicare Other | Admitting: Pulmonary Disease

## 2014-09-01 VITALS — BP 122/76 | HR 55 | Temp 97.0°F | Ht 70.0 in | Wt 220.0 lb

## 2014-09-01 DIAGNOSIS — R0600 Dyspnea, unspecified: Secondary | ICD-10-CM

## 2014-09-01 DIAGNOSIS — J438 Other emphysema: Secondary | ICD-10-CM

## 2014-09-01 DIAGNOSIS — J439 Emphysema, unspecified: Secondary | ICD-10-CM | POA: Insufficient documentation

## 2014-09-01 DIAGNOSIS — R0609 Other forms of dyspnea: Secondary | ICD-10-CM

## 2014-09-01 NOTE — Assessment & Plan Note (Signed)
The patient has multifactorial dyspnea on exertion , with a decreased total lung capacity and abnormal muscle pressures on his PFTs related to his dysfunctional diaphragm. He also has moderate to severe airflow limitation on his spirometry, and has excessive weight and deconditioning.

## 2014-09-01 NOTE — Assessment & Plan Note (Signed)
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 I would like to start him on an aggressive bronchodilator regimen , and see how he responds. The patient is agreeable to this approach.

## 2014-09-01 NOTE — Patient Instructions (Signed)
You have moderate to severe emphysema/copd by your breathing studies. Would like to start you on stiolto 2 inhalations each am everyday no matter how you feel for the next 4 weeks followup with me again in 4 weeks to check on progress and discuss further.

## 2014-09-01 NOTE — Progress Notes (Signed)
   Subjective:    Patient ID: [name], male    DOB: Jul 21, 1934, 80 y.o.   MRN: [id number]  HPI  the patient comes in today for follow-up of his recent pulmonary function studies. He was found to have moderate to severe airflow obstruction , abnormal muscle pressures, mild restriction, and was unable to tolerate the DLCO maneuver. I have reviewed the results with him in detail, and answered all of his questions.   Review of Systems  Constitutional: Negative for fever and unexpected weight change.  HENT: Negative for congestion, dental problem, ear pain, nosebleeds, postnasal drip, rhinorrhea, sinus pressure, sneezing, sore throat and trouble swallowing.   Eyes: Negative for redness and itching.  Respiratory: Positive for shortness of breath. Negative for cough, chest tightness and wheezing.   Cardiovascular: Negative for palpitations and leg swelling.  Gastrointestinal: Negative for nausea and vomiting.  Genitourinary: Negative for dysuria.  Musculoskeletal: Negative for joint swelling.  Skin: Negative for rash.  Neurological: Negative for headaches.  Hematological: Does not bruise/bleed easily.  Psychiatric/Behavioral: Negative for dysphoric mood. The patient is not nervous/anxious.        Objective:   Physical Exam  overweight male in no acute distress  Nose without purulence or discharge noted  Neck without lymphadenopathy or thyromegaly  Chest with decreased breath sounds, especially in the bases.  Lower extremities with minimal edema, no cyanosis  Alert and oriented, moves all 4 extremities.       Assessment & Plan:

## 2014-09-19 ENCOUNTER — Ambulatory Visit: Payer: Medicare Other | Admitting: Internal Medicine

## 2014-09-27 ENCOUNTER — Ambulatory Visit (INDEPENDENT_AMBULATORY_CARE_PROVIDER_SITE_OTHER): Payer: Medicare Other | Admitting: Pulmonary Disease

## 2014-09-27 ENCOUNTER — Encounter: Payer: Self-pay | Admitting: Pulmonary Disease

## 2014-09-27 VITALS — BP 132/68 | HR 54 | Temp 97.8°F | Ht 70.0 in | Wt 224.0 lb

## 2014-09-27 DIAGNOSIS — R0600 Dyspnea, unspecified: Secondary | ICD-10-CM

## 2014-09-27 DIAGNOSIS — J986 Disorders of diaphragm: Secondary | ICD-10-CM

## 2014-09-27 DIAGNOSIS — J438 Other emphysema: Secondary | ICD-10-CM

## 2014-09-27 DIAGNOSIS — R0609 Other forms of dyspnea: Secondary | ICD-10-CM

## 2014-09-27 MED ORDER — TIOTROPIUM BROMIDE-OLODATEROL 2.5-2.5 MCG/ACT IN AERS: 2.0000 | INHALATION_SPRAY | Freq: Every day | RESPIRATORY_TRACT | Status: DC

## 2014-09-27 NOTE — Progress Notes (Signed)
   Subjective:    Patient ID: [name], male    DOB: 1933-11-05, 81 y.o.   MRN: [id number]  HPI  The patient comes in today for follow-up of his known COPD. He was started on stiolto at his last visit, and he has seen definite improvement since being on the medication. He has multifactorial dyspnea on exertion, with his weight and conditioning, as well as dysfunctional diaphragm contributing to his symptoms. Denies any significant cough, mucus, or purulence.   Review of Systems  Constitutional: Negative for fever and unexpected weight change.  HENT: Positive for sinus pressure. Negative for congestion, dental problem, ear pain, nosebleeds, postnasal drip, rhinorrhea, sneezing, sore throat and trouble swallowing.   Eyes: Negative for redness and itching.  Respiratory: Positive for cough and shortness of breath. Negative for chest tightness and wheezing.   Cardiovascular: Negative for palpitations and leg swelling.  Gastrointestinal: Negative for nausea and vomiting.  Genitourinary: Negative for dysuria.  Musculoskeletal: Negative for joint swelling.  Skin: Negative for rash.  Neurological: Negative for headaches.  Hematological: Does not bruise/bleed easily.  Psychiatric/Behavioral: Negative for dysphoric mood. The patient is not nervous/anxious.        Objective:   Physical Exam Well-developed male in no acute distress Nose without purulence or discharge noted Neck without lymphadenopathy or thyromegaly Chest with decreased breath sounds, but no active wheezing or crackles Cardiac exam with regular rate and rhythm Lower extremities with mild ankle edema, no cyanosis. There is a hematoma along his tibial plateau on the right from a fall that he had this morning prior to coming to the office. Alert and oriented, moves all 4 extremities.       Assessment & Plan:

## 2014-09-27 NOTE — Patient Instructions (Signed)
Stay on stiolto, and we will send in a prescription for you.  Let us know if not covered by your insurance. Work on [employer] loss and conditioning Consider referral to pulmonary rehab program at [facility] as we discussed. followup with me again in 70mos, but call if breathing is not doing well.

## 2014-09-27 NOTE — Assessment & Plan Note (Signed)
The patient has multifactorial dyspnea on exertion related to his obesity, deconditioning, dysfunctional diaphragm, and also his moderate obstructive lung disease. He has seen some definite improvement with stiolto, but he and I have discussed again that he will see the most improvement if he is able to lose weight and improve his conditioning. I have discussed with him referral to the pulmonary rehabilitation program, but he would like to think about this first. I would like to see him back in 4 months if he is doing well, but he is to call if he has worsening symptoms.

## 2014-10-24 ENCOUNTER — Ambulatory Visit (INDEPENDENT_AMBULATORY_CARE_PROVIDER_SITE_OTHER): Payer: Medicare Other | Admitting: Internal Medicine

## 2014-10-24 ENCOUNTER — Encounter: Payer: Self-pay | Admitting: Internal Medicine

## 2014-10-24 VITALS — BP 162/70 | HR 74 | Ht 70.0 in | Wt 223.0 lb

## 2014-10-24 DIAGNOSIS — R0602 Shortness of breath: Secondary | ICD-10-CM

## 2014-10-24 NOTE — Patient Instructions (Signed)
Your physician recommends that you schedule a follow-up appointment in with Truitt Merle NP with 4-5 weeks  Your physician has requested that you have an echocardiogram. Echocardiography is a painless test that uses sound waves to create images of your heart. It provides your doctor with information about the size and shape of your heart and how well your heart's chambers and valves are working. This procedure takes approximately one hour. There are no restrictions for this procedure.  Your physician has requested that you have a lexiscan myoview. For further information please visit [email or web address]. Please follow instruction sheet, as given.

## 2014-10-24 NOTE — Progress Notes (Signed)
Primary Care Physician: Donnajean Lopes, MD Referring Physician:Dr. Rishikesh Khachatryan is a 81 y.o. male with a h/o PVC ablation 05/29/10, obesity,sedentary lifestyle,OSA treated with cpap, dysfunctional diaphragm,chronic dyspnea followed by Dr. [name] that is here for evaluation of dyspnea. He does not feel dyspnea at rest but with exertion, especially with inclines. Dr. [name] wanted cardiac status reevaluated to assess for any cardiac issues that may be contributing to dyspnea.He has a stress test in 2011 that did not show significant disease. He denies any further issues with PVC's.  Today, he denies symptoms of palpitations, chest pain,  orthopnea, PND, dizziness, presyncope, syncope, or neurologic sequela.Positive for chronic exertional dyspnea and pedal edema.  The patient is tolerating medications without difficulties and is otherwise without complaint today.   Past Medical History  Diagnosis Date  . Hypercholesteremia   . OSA (obstructive sleep apnea)   . Dyspnea     due to paralyzed diaphragm and pulmonary issues  . Obesity   . Gout   . PVC (premature ventricular contraction)     s/p PVC ablation 05/29/2010  . HTN (hypertension)   . Coronary artery ectasia     Mild CAD with normal systolic function per cath in August of 2011  . Paroxysmal atrial fibrillation     occured in the setting of acute E Coli sepsis and ileius 7/12  . Diaphragmatic paralysis     felt to be partially responsible for dyspnea  . BPH (benign prostatic hyperplasia)   . Microhematuria    Past Surgical History  Procedure Laterality Date  . Cholecystectomy  1990  . Appendectomy  1978  . Poly removed from nose as a child    . Pvc ablation  05/29/2010  . US echocardiography  03-09-2010    EF 55-60%  . Cardiac catheterization  04-30-2010    Left main coronary artery is normal.   . Cardiovascular stress test  03-19-2010    0%    Current Outpatient Prescriptions  Medication Sig Dispense Refill  .  allopurinol (ZYLOPRIM) 300 MG tablet Take 300 mg by mouth daily.      Marland Kitchen amLODipine (NORVASC) 5 MG tablet Take 5 mg by mouth daily.    Marland Kitchen aspirin 81 MG tablet Take 81 mg by mouth daily.      Marland Kitchen dicyclomine (BENTYL) 20 MG tablet Take 20 mg by mouth 2 (two) times daily.     Marland Kitchen doxazosin (CARDURA) 4 MG tablet Take 1 tablet (4 mg total) by mouth daily. 30 tablet 3  . ibuprofen (ADVIL,MOTRIN) 200 MG tablet Take 200-400 mg by mouth every 6 (six) hours as needed. For pain    . lisinopril (PRINIVIL,ZESTRIL) 40 MG tablet Take 40 mg by mouth daily.     Marland Kitchen lovastatin (MEVACOR) 40 MG tablet Take 40 mg by mouth at bedtime.      . metoprolol succinate (TOPROL-XL) 25 MG 24 hr tablet Take 25 mg by mouth 2 (two) times daily.     . sertraline (ZOLOFT) 100 MG tablet Take 100 mg by mouth daily.  5  . traZODone (DESYREL) 100 MG tablet Take 100 mg by mouth at bedtime.       No current facility-administered medications for this visit.    No Known Allergies  History   Social History  . Marital Status: Married    Spouse Name: N/A  . Number of Children: N/A  . Years of Education: N/A   Occupational History  . Retired    Social History  Main Topics  . Smoking status: Former Smoker -- 3.00 packs/day for 20 years    Types: Cigarettes    Quit date: 09/09/1984  . Smokeless tobacco: Never Used  . Alcohol Use: 0.0 oz/week    0 Standard drinks or equivalent per week     Comment: 1 glass of wine per week  . Drug Use: No  . Sexual Activity: Yes   Other Topics Concern  . Not on file   Social History Narrative    Family History  Problem Relation Age of Onset  . Aneurysm Father 46    brain aneurysm  . Breast cancer Mother   . Breast cancer Daughter     ROS- All systems are reviewed and negative except as per the HPI above  Physical Exam: Filed Vitals:   10/24/14 1446  BP: 162/70  Pulse: 74  Height: 5\' 10"  (1.778 m)  Weight: 223 lb (101.152 kg)    GEN- The patient is well appearing, alert and  oriented x 3 today.   Head- normocephalic, atraumatic Eyes-  Sclera clear, conjunctiva pink Ears- hearing intact Oropharynx- clear Neck- supple, no JVP Lymph- no cervical lymphadenopathy Lungs- Clear to ausculation bilaterally, normal work of breathing Heart- Regular rate and rhythm, no murmurs, rubs or gallops, PMI not laterally displaced GI- soft, NT, ND, + BS Extremities- no clubbing, cyanosis, or edema MS- no significant deformity or atrophy Skin- no rash or lesion Psych- euthymic mood, full affect Neuro- strength and sensation are intact  EKG- Sinus rhythm with first degree AV block, RBBB 74 bpm,   Assessment and Plan:  1. Dyspnea probably multifactorial Surface echo Lexi Myoview Encouraged to set up a regular exercise progam-  program at [facility] encouraged Reduce alcohol intake to one drink a night and modify diet for weight loss. Continue to use CPAP F/u with Truitt Merle in 4 weeks. F/u Ep as needed  I have seen, examined the patient, and reviewed the above assessment and plan with Roderic Palau NP.  Changes to above are made where necessary.  The patient is well known to me and typically follows with Truitt Merle.  He has not been seen in several years.  Previously his SOB was felt to be due to chronic lung issues and paralyzed hemidiaphragm.  Cath 2011 did not show CAD.  He does feel that his SOB is incrementally worsened.  I therefore think that it is reasonable to proceed with lexiscan myoview (he does not feel that he can walk on a treadmill) and echo to evaluate SOB.  CPX testing could potentially help with clarification of pulmonary component however he does not think that he can walk on a treadmill.  He will follow-up with Cecille Rubin in 4 weeks on the results of testing.   Co Sign: Thompson Grayer, MD 10/24/2014 4:49 PM

## 2014-10-26 ENCOUNTER — Encounter: Payer: Self-pay | Admitting: Internal Medicine

## 2014-11-07 ENCOUNTER — Ambulatory Visit (HOSPITAL_COMMUNITY): Payer: Medicare Other | Attending: Cardiovascular Disease | Admitting: Radiology

## 2014-11-07 ENCOUNTER — Ambulatory Visit (HOSPITAL_BASED_OUTPATIENT_CLINIC_OR_DEPARTMENT_OTHER): Payer: Medicare Other | Admitting: Cardiology

## 2014-11-07 DIAGNOSIS — R0602 Shortness of breath: Secondary | ICD-10-CM

## 2014-11-07 MED ORDER — TECHNETIUM TC 99M SESTAMIBI GENERIC - CARDIOLITE
11.0000 | Freq: Once | INTRAVENOUS | Status: AC | PRN
  Administered 2014-11-07: 11 via INTRAVENOUS

## 2014-11-07 MED ORDER — TECHNETIUM TC 99M SESTAMIBI GENERIC - CARDIOLITE
33.0000 | Freq: Once | INTRAVENOUS | Status: AC | PRN
  Administered 2014-11-07: 33 via INTRAVENOUS

## 2014-11-07 MED ORDER — REGADENOSON 0.4 MG/5ML IV SOLN
0.4000 mg | Freq: Once | INTRAVENOUS | Status: AC
  Administered 2014-11-07: 0.4 mg via INTRAVENOUS

## 2014-11-07 NOTE — Progress Notes (Signed)
Echo performed. 

## 2014-11-07 NOTE — Progress Notes (Signed)
[facility] [address] [place], [place] [address] [phone]    Cardiology Nuclear Med Study  [name] is a 81 y.o. male     MRN : [id number]     DOB: 02-Jun-1934  Procedure Date: 11/07/2014  Nuclear Med Background Indication for Stress Test:  Evaluation for Ischemia,and Follow up CAD History:  CAD (mild per cath), MPI 2011 (normal) Cardiac Risk Factors: Hypertension  Symptoms:  SOB   Nuclear Pre-Procedure Caffeine/Decaff Intake:  None> 12 hrs NPO After: 8:00pm   Lungs:  clear O2 Sat: 94% on 2L O2 via Patient connected to nasal cannula oxygen. IV 0.9% NS with Angio Cath:  22g  IV Site: R Hand x 1, tolerated well IV Started by:  Irven Baltimore, RN  Chest Size (in):  46 Cup Size: n/a  Height: 5\' 10"  (1.778 m)  Weight:  218 lb (98.884 kg)  BMI:  Body mass index is 31.28 kg/(m^2). Tech Comments:  Patient took Toprol this am. Irven Baltimore, RN.    Nuclear Med Study 1 or 2 day study: 1 day  Stress Test Type:  Lexiscan  Reading MD: N/A  Order Authorizing Provider:  Thompson Grayer, MD  Resting Radionuclide: Technetium 50m Sestamibi  Resting Radionuclide Dose: 11.0 mCi   Stress Radionuclide:  Technetium 82m Sestamibi  Stress Radionuclide Dose: 33.0 mCi           Stress Protocol Rest HR: 58 Stress HR: 72  Rest BP: 140/111 Stress BP: 139/73  Exercise Time (min): n/a METS: n/a   Predicted Max HR: 142 bpm % Max HR: 50.7 bpm Rate Pressure Product: 10584   Dose of Adenosine (mg):  n/a Dose of Lexiscan: 0.4 mg  Dose of Atropine (mg): n/a Dose of Dobutamine: n/a mcg/kg/min (at max HR)  Stress Test Technologist: Glade Lloyd, BS-ES  Nuclear Technologist:  Earl Many, CNMT     Rest Procedure:  Myocardial perfusion imaging was performed at rest 45 minutes following the intravenous administration of Technetium 41m Sestamibi. Rest ECG: Atrial Fibrilliation  Stress Procedure:  The patient received IV Lexiscan 0.4 mg over 15-seconds.   Technetium 68m Sestamibi injected at 30-seconds.  Quantitative spect images were obtained after a 45 minute delay.  During the infusion of Lexiscan the patient complained of nausea that was resolving in recovery.  Stress ECG: No significant change from baseline ECG  QPS Raw Data Images:  Normal; no motion artifact; normal heart/lung ratio. Stress Images:  Medium-sized, mild basal inferior and basal to mid inferolateral perfusion defect. Rest Images:  Medium-sized, moderate basal inferior and basal to mid inferolateral perfusion defect. Subtraction (SDS):  No evidence of ischemia. Transient Ischemic Dilatation (Normal <1.22):  0.98 Lung/Heart Ratio (Normal <0.45):  0.30  Quantitative Gated Spect Images QGS EDV:  124 ml QGS ESV:  46 ml  Impression Exercise Capacity:  Lexiscan with no exercise. BP Response:  Normal blood pressure response. Clinical Symptoms:  Nausea. ECG Impression:  Atrial fibrillation, no changes from baseline.  Comparison with Prior Nuclear Study: No images to compare  Overall Impression:  Low risk stress nuclear study a medium-sized basal inferior and basal to mid inferolateral perfusion defect that is actually worse at rest than with stress.  No ischemia.  Given normal wall motion, this may represent diaphragmatic attenuation. .  LV Ejection Fraction: 63%.  LV Wall Motion:  NL LV Function; NL Wall Motion   Loralie Champagne 11/07/2014

## 2014-11-14 ENCOUNTER — Telehealth: Payer: Self-pay | Admitting: Internal Medicine

## 2014-11-14 ENCOUNTER — Telehealth: Payer: Self-pay | Admitting: *Deleted

## 2014-11-14 NOTE — Telephone Encounter (Signed)
-----   Message from Thompson Grayer, MD sent at 11/09/2014  8:48 PM EST ----- Results reviewed.  Please inform pt of low risk result.

## 2014-11-14 NOTE — Telephone Encounter (Signed)
New Msg         Pt returning phone call from today.   Please call back.

## 2014-11-14 NOTE — Telephone Encounter (Signed)
Spoke with patient and made him aware of results

## 2014-11-14 NOTE — Telephone Encounter (Signed)
Left message informing pt of normal echo and stress test results.

## 2014-11-29 ENCOUNTER — Encounter: Payer: Self-pay | Admitting: Nurse Practitioner

## 2014-11-29 ENCOUNTER — Ambulatory Visit (INDEPENDENT_AMBULATORY_CARE_PROVIDER_SITE_OTHER): Payer: Medicare Other | Admitting: Nurse Practitioner

## 2014-11-29 VITALS — BP 128/72 | HR 71 | Ht 70.0 in | Wt 222.4 lb

## 2014-11-29 DIAGNOSIS — I10 Essential (primary) hypertension: Secondary | ICD-10-CM | POA: Diagnosis not present

## 2014-11-29 DIAGNOSIS — E663 Overweight: Secondary | ICD-10-CM

## 2014-11-29 DIAGNOSIS — I493 Ventricular premature depolarization: Secondary | ICD-10-CM | POA: Diagnosis not present

## 2014-11-29 DIAGNOSIS — R0602 Shortness of breath: Secondary | ICD-10-CM | POA: Diagnosis not present

## 2014-11-29 NOTE — Patient Instructions (Signed)
I will see you in 6 months  Stay on your current medicines  Try to be as active as much as you can  Call the [place] office at [phone] if you have any questions, problems or concerns.

## 2014-11-29 NOTE — Progress Notes (Signed)
CARDIOLOGY OFFICE NOTE  Date:  11/29/2014    [name] Date of Birth: 1934/08/21 Medical Record #[id number]   PCP:  Donnajean Lopes, MD  Cardiologist:  [name]    Chief Complaint  Patient presents with  . Shortness of Breath    Follow up visit. Seen for Dr. [name].      History of Present Illness: [name] is a 81 y.o. male who presents today for a follow up visit. Seen for Dr. [name]. He has seen Dr. [name] in the remote past. He has a h/o PVC ablation 05/29/10, obesity,sedentary lifestyle, HLD, gout, OSA treated with CPAP, dysfunctional diaphragm with chronic dyspnea followed by Dr. [name]. He has mild CAD per cath back in August of 2011.   Seen last month by Dr. [name] - this was for evaluation of dyspnea.  Dr. [name] wanted cardiac status reevaluated to assess for any cardiac issues that may be contributing to dyspnea. Was referred for Myoview and echocardiogram. Encouraged to cut back on his alcohol intake as well.   Comes back today. Here alone. He says he is doing ok. He feels like his breathing is a little better. He is using a new nebulizer and thinks this is helping. 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 He can walk about a quarter of a mile on his treadmill about 2 x a week. No chest pain. Rhythm ok. He has been started on Wellbutrin as well.   Past Medical History  Diagnosis Date  . Hypercholesteremia   . OSA (obstructive sleep apnea)   . Dyspnea     due to paralyzed diaphragm and pulmonary issues  . Obesity   . Gout   . PVC (premature ventricular contraction)     s/p PVC ablation 05/29/2010  . HTN (hypertension)   . Coronary artery ectasia     Mild CAD with normal systolic function per cath in August of 2011  . Paroxysmal atrial fibrillation     occured in the setting of acute E Coli sepsis and ileius 7/12  . Diaphragmatic paralysis     felt to be partially responsible for dyspnea  . BPH  (benign prostatic hyperplasia)   . Microhematuria     Past Surgical History  Procedure Laterality Date  . Cholecystectomy  1990  . Appendectomy  1978  . Poly removed from nose as a child    . Pvc ablation  05/29/2010  . US echocardiography  03-09-2010    EF 55-60%  . Cardiac catheterization  04-30-2010    Left main coronary artery is normal.   . Cardiovascular stress test  03-19-2010    0%     Medications: Current Outpatient Prescriptions  Medication Sig Dispense Refill  . albuterol (PROVENTIL) (5 MG/ML) 0.5% nebulizer solution Take 2.5 mg by nebulization every 12 (twelve) hours.    Marland Kitchen allopurinol (ZYLOPRIM) 300 MG tablet Take 300 mg by mouth daily.      Marland Kitchen amLODipine (NORVASC) 5 MG tablet Take 5 mg by mouth daily.    Marland Kitchen aspirin 81 MG tablet Take 81 mg by mouth daily.      Marland Kitchen buPROPion (WELLBUTRIN SR) 150 MG 12 hr tablet Take 150 mg by mouth daily.     Marland Kitchen dicyclomine (BENTYL) 20 MG tablet Take 20 mg by mouth 2 (two) times daily.     Marland Kitchen doxazosin (CARDURA) 4 MG tablet Take 1 tablet (4 mg total) by  mouth daily. 30 tablet 3  . ibuprofen (ADVIL,MOTRIN) 200 MG tablet Take 200-400 mg by mouth every 6 (six) hours as needed. For pain    . lisinopril (PRINIVIL,ZESTRIL) 40 MG tablet Take 40 mg by mouth daily.     Marland Kitchen lovastatin (MEVACOR) 40 MG tablet Take 40 mg by mouth at bedtime.      . metoprolol succinate (TOPROL-XL) 25 MG 24 hr tablet Take 25 mg by mouth 2 (two) times daily.     . sertraline (ZOLOFT) 100 MG tablet Take 100 mg by mouth daily.  5  . traZODone (DESYREL) 100 MG tablet Take 100 mg by mouth at bedtime.       No current facility-administered medications for this visit.    Allergies: No Known Allergies  Social History: The patient  reports that he quit smoking about 30 years ago. His smoking use included Cigarettes. He has a 60 pack-year smoking history. He has never used smokeless tobacco. He reports that he drinks alcohol. He reports that he does not use illicit drugs.   Family  History: The patient's family history includes Aneurysm (age of onset: 33) in his father; Breast cancer in his daughter and mother.   Review of Systems: Please see the history of present illness.   Otherwise, the review of systems is positive for depression - just had Wellbutrin added. He has chronic dyspnea and easy bruising.   All other systems are reviewed and negative.   Physical Exam: VS:  BP 128/72 mmHg  Pulse 71  Ht 5\' 10"  (1.778 m)  Wt 222 lb 6.4 oz (100.88 kg)  BMI 31.91 kg/m2  SpO2 93% .  BMI Body mass index is 31.91 kg/(m^2).  Wt Readings from Last 3 Encounters:  11/29/14 222 lb 6.4 oz (100.88 kg)  11/07/14 218 lb (98.884 kg)  10/24/14 223 lb (101.152 kg)    General: Pleasant. Well developed, well nourished and in no acute distress.  HEENT: Normal. Neck: Supple, no JVD, carotid bruits, or masses noted.  Cardiac: Regular rate and rhythm. No murmurs, rubs, or gallops. No edema.  Respiratory:  Lungs are clear to auscultation bilaterally with normal work of breathing.  GI: Soft and nontender.  MS: No deformity or atrophy. Gait and ROM intact. Skin: Warm and dry. Color is normal.  Neuro:  Strength and sensation are intact and no gross focal deficits noted.  Psych: Alert, appropriate and with normal affect.   LABORATORY DATA:  EKG:  EKG is not ordered today.   Lab Results  Component Value Date   WBC 5.6 10/14/2011   HGB 14.6 10/14/2011   HCT 42.8 10/14/2011   PLT 142.0* 10/14/2011   GLUCOSE 93 11/04/2011   ALT 28 04/05/2011   AST 32 04/05/2011   NA 140 11/04/2011   K 4.1 11/04/2011   CL 105 11/04/2011   CREATININE 0.9 11/04/2011   BUN 30* 11/04/2011   CO2 29 11/04/2011   TSH 1.66 10/14/2011   INR 1.26 04/07/2011    BNP (last 3 results) No results for input(s): BNP in the last 8760 hours.  ProBNP (last 3 results) No results for input(s): PROBNP in the last 8760 hours.   Other Studies Reviewed Today:  Myoview Impression from March 2016 Exercise  Capacity: [place] with no exercise. BP Response: Normal blood pressure response. Clinical Symptoms: Nausea. ECG Impression: Atrial fibrillation, no changes from baseline.  Comparison with Prior Nuclear Study: No images to compare  Overall Impression: Low risk stress nuclear study a medium-sized basal inferior  and basal to mid inferolateral perfusion defect that is actually worse at rest than with stress. No ischemia. Given normal wall motion, this may represent diaphragmatic attenuation. .  LV Ejection Fraction: 63%. LV Wall Motion: NL LV Function; NL Wall Motion   Loralie Champagne 11/07/2014   Echo Study Conclusions from February 2016  - Left ventricle: The cavity size was normal. Wall thickness was increased in a pattern of mild LVH. Systolic function was normal. The estimated ejection fraction was in the range of 60% to 65%. Wall motion was normal; there were no regional wall motion abnormalities. - Aortic valve: There was trivial regurgitation. - Left atrium: The atrium was mildly dilated. - Right atrium: The atrium was mildly dilated.  Assessment/Plan: 1. Dyspnea -  Seems better. His cardiac studies are stable.   2. PVCs - past ablation - no problems reported.   3. HTN -  BP at goal  4. Obesity - encouraged him to really work on diet/try to increase activity, etc.  Current medicines are reviewed with the patient today.  The patient does not have concerns regarding medicines other than what has been noted above.  The following changes have been made:  See above.  Labs/ tests ordered today include:   No orders of the defined types were placed in this encounter.     Disposition:   FU with me in 6 months  Patient is agreeable to this plan and will call if any problems develop in the interim.   Signed: Burtis Junes, RN, ANP-C 11/29/2014 8:08 AM  [facility] [address] [place], [place]  [address] Phone:  [phone] Fax: [phone]

## 2015-01-26 ENCOUNTER — Ambulatory Visit: Payer: Medicare Other | Admitting: Pulmonary Disease

## 2015-03-06 ENCOUNTER — Other Ambulatory Visit: Payer: Self-pay

## 2015-05-30 ENCOUNTER — Encounter: Payer: Self-pay | Admitting: Nurse Practitioner

## 2015-05-30 ENCOUNTER — Ambulatory Visit (INDEPENDENT_AMBULATORY_CARE_PROVIDER_SITE_OTHER): Payer: Medicare Other | Admitting: Nurse Practitioner

## 2015-05-30 VITALS — BP 140/72 | HR 67 | Ht 70.0 in | Wt 220.8 lb

## 2015-05-30 DIAGNOSIS — R0602 Shortness of breath: Secondary | ICD-10-CM

## 2015-05-30 DIAGNOSIS — I10 Essential (primary) hypertension: Secondary | ICD-10-CM | POA: Diagnosis not present

## 2015-05-30 DIAGNOSIS — I493 Ventricular premature depolarization: Secondary | ICD-10-CM

## 2015-05-30 DIAGNOSIS — I259 Chronic ischemic heart disease, unspecified: Secondary | ICD-10-CM
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
## 2015-05-30 NOTE — Patient Instructions (Addendum)
We will be checking the following labs today - NONE   Medication Instructions:    Continue with your current medicines.     Testing/Procedures To Be Arranged:  N/A  Follow-Up:   See me in 6 months  We will get you a visit with Pulmonary - Dr. [name], Lamonte Sakai, or Ramaswamy only - patient previously seen by Dr. [name]    Other Special Instructions:   N/A  Call the [place] office at [phone] if you have any questions, problems or concerns.

## 2015-05-31 ENCOUNTER — Encounter: Payer: Self-pay | Attending: Internal Medicine | Admitting: Dietician

## 2015-05-31 ENCOUNTER — Encounter: Payer: Self-pay | Admitting: Dietician

## 2015-05-31 DIAGNOSIS — Z713 Dietary counseling and surveillance: Secondary | ICD-10-CM | POA: Insufficient documentation

## 2015-05-31 DIAGNOSIS — E669 Obesity, unspecified: Secondary | ICD-10-CM

## 2015-05-31 DIAGNOSIS — Z6831 Body mass index (BMI) 31.0-31.9, adult: Secondary | ICD-10-CM | POA: Insufficient documentation

## 2015-05-31 NOTE — Progress Notes (Signed)
  Medical Nutrition Therapy:  Appt start time: 1110 end time:  2778.   Assessment:  Primary concerns today: Mr. Arts reports that he is here because his doctor thought seeing a dietitian would be beneficial. He declined an updated weight today. He states that he has always been sedentary due to his job and "eats things he shouldn't." He has lost weight in the past and has put it back on. He states that he would like to lose weight and become more active. 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 Wilma reports that his wife cooks healthy foods and does not add salt. He states that he gets out of breath easily when he exercises for long periods of time (more than 10 minutes).    Preferred Learning Style:  No preference indicated   Learning Readiness:   Ready  MEDICATIONS: see list   DIETARY INTAKE:  Usual eating pattern includes 3 meals and 1 snacks per day. Avoided foods include caffeine, lactose (can tolerate cheese and yogurt).    24-hr recall:  B ( AM): Raisin Bran with fat free Lactaid milk OR granola bar Snk ( AM):   L ( PM): peanut butter and jelly or banana sandwich, water Snk ( PM): salt free peanuts and Scotch D ( PM): pork tenderloin, field peas, sweet potato souffle, salad, sweet tea Snk ( PM): none  Beverages: water, Scotch and water, sweet tea  Usual physical activity: walking on treadmill, inconsistent  Estimated energy needs: 1800-2000 calories 200-225 g carbohydrates 135-150 g protein 50-56 g fat  Progress Towards Goal(s):  In progress.   Nutritional Diagnosis:  Echo-3.3 Overweight/obesity As related to history of excessive energy intake and sedentary lifestyle.  As evidenced by referral to dietitian for obesity.    Intervention:  Nutrition counseling provided. Goals: -Increase exercise  -Aim for 3-4x a week  -Start with 10 minutes (listen to your body) -Fill up  on non starchy vegetables  -Vegetables are good raw or cooked, fresh or frozen -Continue to avoid adding salt to foods -Keep limiting sweets to only occasionally -Stick to lean meats (lean cuts of beef and pork, fish, chicken, [place]) -Healthy fats: olive oil, nuts, fish -Limit portions of starch to about a half a cup per meal -Consider increasing water intake  Teaching Method Utilized:  Visual Auditory Hands on  Handouts given during visit include:  MyPlate  Meal planning card  Barriers to learning/adherence to lifestyle change: work schedule, gets out of breath easily when exercising  Demonstrated degree of understanding via:  Teach Back   Monitoring/Evaluation:  Dietary intake, exercise, and body weight prn.

## 2015-05-31 NOTE — Patient Instructions (Addendum)
-  Increase exercise  -Aim for 3-4x a week  -Start with 10 minutes (listen to your body)  -Fill up on non starchy vegetables  -Vegetables are good raw or cooked, fresh or frozen  -Continue to avoid adding salt to foods  -Keep limiting sweets to only occasionally  -Stick to lean meats (lean cuts of beef and pork, fish, chicken, [place]) -Healthy fats: olive oil, nuts, fish  -Limit portions of starch to about a half a cup per meal  -Consider increasing water intake

## 2015-07-06 ENCOUNTER — Ambulatory Visit (INDEPENDENT_AMBULATORY_CARE_PROVIDER_SITE_OTHER): Payer: Medicare Other | Admitting: Pulmonary Disease

## 2015-07-06 ENCOUNTER — Encounter: Payer: Self-pay | Admitting: Pulmonary Disease

## 2015-07-06 VITALS — BP 142/82 | HR 52 | Ht 70.0 in | Wt 217.6 lb

## 2015-07-06 DIAGNOSIS — Z23 Encounter for immunization: Secondary | ICD-10-CM | POA: Diagnosis not present

## 2015-07-06 DIAGNOSIS — J438 Other emphysema: Secondary | ICD-10-CM | POA: Diagnosis not present

## 2015-07-06 DIAGNOSIS — J986 Disorders of diaphragm: Secondary | ICD-10-CM | POA: Diagnosis not present

## 2015-07-06 NOTE — Assessment & Plan Note (Signed)
He remains compliant with CPAP therapy. We will request a compliance report.

## 2015-07-06 NOTE — Patient Instructions (Signed)
We will see you back in one year or sooner if needed Exercise as much as possible We will request a download of a compliance report from your CPAP machine.

## 2015-07-06 NOTE — Assessment & Plan Note (Signed)
December 2015 pulmonary function testing showed severe airflow obstruction but this was in the presence of restrictive lung disease so it's difficult to quantify his degree of obstruction. Despite this finding on his lung function testing he has minimal symptoms of dyspnea. I see no indication for starting inhaled therapy considering his minimal symptoms. I did explain to him that he is at increased risk for respiratory infection and so we talked for a while today about preventative measures in that regard.  Plan: Exercise as much as possible Flu shot today Continue as needed albuterol Follow-up one year

## 2015-07-06 NOTE — Assessment & Plan Note (Signed)
He has chronic elevation of the left hemidiaphragm but his sniff test in 2011 showed normal diaphragm function and movement. He does have some restrictive lung disease seen on his lung function test which is likely related to mild diaphragm weakness based on his imaging findings. His dyspnea has not progressed and so at this time I see no indication for further testing. He does need to continue using CPAP at night.

## 2015-07-06 NOTE — Progress Notes (Signed)
Subjective:    Patient ID: [name], male    DOB: 1933-10-25, 81 y.o.   MRN: [id number]  Synopsis: Former patient of Dr. [name] who is referred in 2015 for shortness of breath. Found to have evidence of diaphragm weakness as well as COPD. Also treated for obstructive sleep apnea. PFT's 08/2014:  FEV1 0.92 (31%), ratio 55, decrease in muscle pressures, mild restriction, could not do DLCO maneuver.  HPI Chief Complaint  Patient presents with  . Follow-up    Former [facility] pt being treated for [place]. Pt c/o of continued dyspnea with exertion. Pt denies cough/congestion/wheeze/CP/tightness.     [name] is a very pleasant 81 year old male formally saw my partner for COPD, obstructive sleep apnea, and presumed diaphragm muscle weakness. He says that he has had dyspnea for several years which has been stable. He only feels short of breath when he climbs a flight of stairs or make significant effort to exert himself. Regular activity such as walking on level ground, carrying groceries,or exercising on a treadmill does not make him of breath. He does walk 10-15 minutes on a treadmill 3 times per week. He does not have problems with cough. He does not have mucus production. He cannot remember when he had the pneumonia vaccine but he is sure that he has had it.  He says that his dyspnea has not progressed since the last visit.   Past Medical History  Diagnosis Date  . Hypercholesteremia   . OSA (obstructive sleep apnea)   . Dyspnea     due to paralyzed diaphragm and pulmonary issues  . Obesity   . Gout   . PVC (premature ventricular contraction)     s/p PVC ablation 05/29/2010  . HTN (hypertension)   . Coronary artery ectasia     Mild CAD with normal systolic function per cath in August of 2011  . Paroxysmal atrial fibrillation (HCC)     occured in the setting of acute E Coli sepsis and ileius 7/12  . Diaphragmatic paralysis     felt to be partially responsible for dyspnea  . BPH (benign  prostatic hyperplasia)   . Microhematuria       Review of Systems  Constitutional: Negative for chills, diaphoresis and fatigue.  HENT: Negative for rhinorrhea, sinus pressure and sneezing.   Respiratory: Positive for shortness of breath. Negative for cough and wheezing.   Cardiovascular: Negative for chest pain, palpitations and leg swelling.       Objective:   Physical Exam Filed Vitals:   07/06/15 0918  BP: 142/82  Pulse: 52  Height: 5\' 10"  (1.778 m)  Weight: 217 lb 9.6 oz (98.703 kg)  SpO2: 93%   RA  Gen: well appearing HENT: OP clear, TM's clear, neck supple PULM: CTA B, normal percussion CV: RRR, no mgr, trace edema GI: BS+, soft, nontender Derm: no cyanosis or rash Psyche: normal mood and affect  CXR, CT abdomen, and diaphragm fluoroscopy images personally reviewed today in clinic, mild left hemidiaphragm elevation noted.     Assessment & Plan:  Obstructive sleep apnea He remains compliant with CPAP therapy. We will request a compliance report.  Disorder of diaphragm He has chronic elevation of the left hemidiaphragm but his sniff test in 2011 showed normal diaphragm function and movement. He does have some restrictive lung disease seen on his lung function test which is likely related to mild diaphragm weakness based on his imaging findings. His dyspnea has not progressed and so at this time  I see no indication for further testing. He does need to continue using CPAP at night.  COPD (chronic obstructive pulmonary disease) with emphysema [facility]) December 2015 pulmonary function testing showed severe airflow obstruction but this was in the presence of restrictive lung disease so it's difficult to quantify his degree of obstruction. Despite this finding on his lung function testing he has minimal symptoms of dyspnea. I see no indication for starting inhaled therapy considering his minimal symptoms. I did explain to him that he is at increased risk for respiratory  infection and so we talked for a while today about preventative measures in that regard.  Plan: Exercise as much as possible Flu shot today Continue as needed albuterol Follow-up one year  > 25 minutes spent in face to face time in a 40 minute visit   Current outpatient prescriptions:  .  albuterol (PROVENTIL) (5 MG/ML) 0.5% nebulizer solution, Take 2.5 mg by nebulization every 6 (six) hours as needed for shortness of breath. , Disp: , Rfl:  .  allopurinol (ZYLOPRIM) 300 MG tablet, Take 300 mg by mouth daily.  , Disp: , Rfl:  .  amLODipine (NORVASC) 5 MG tablet, Take 5 mg by mouth daily., Disp: , Rfl: 6 .  aspirin 81 MG tablet, Take 81 mg by mouth daily.  , Disp: , Rfl:  .  buPROPion (WELLBUTRIN SR) 150 MG 12 hr tablet, Take 150 mg by mouth every morning., Disp: , Rfl: 12 .  dicyclomine (BENTYL) 20 MG tablet, Take 20 mg by mouth 2 (two) times daily. , Disp: , Rfl:  .  doxazosin (CARDURA) 4 MG tablet, Take 1 tablet (4 mg total) by mouth daily., Disp: 30 tablet, Rfl: 3 .  ibuprofen (ADVIL,MOTRIN) 200 MG tablet, Take 200-400 mg by mouth every 6 (six) hours as needed (body aches). For pain, Disp: , Rfl:  .  lisinopril (PRINIVIL,ZESTRIL) 40 MG tablet, Take 40 mg by mouth daily. , Disp: , Rfl:  .  lovastatin (MEVACOR) 40 MG tablet, Take 40 mg by mouth at bedtime.  , Disp: , Rfl:  .  metoprolol succinate (TOPROL-XL) 25 MG 24 hr tablet, Take 25 mg by mouth 2 (two) times daily. , Disp: , Rfl:  .  sertraline (ZOLOFT) 100 MG tablet, Take 100 mg by mouth daily., Disp: , Rfl: 5 .  traZODone (DESYREL) 100 MG tablet, Take 100 mg by mouth at bedtime.  , Disp: , Rfl:

## 2015-11-28 ENCOUNTER — Encounter: Payer: Self-pay | Admitting: Nurse Practitioner

## 2015-11-28 ENCOUNTER — Ambulatory Visit (INDEPENDENT_AMBULATORY_CARE_PROVIDER_SITE_OTHER): Payer: Medicare Other | Admitting: Nurse Practitioner

## 2015-11-28 VITALS — BP 132/64 | HR 59 | Ht 70.0 in | Wt 220.0 lb

## 2015-11-28 DIAGNOSIS — I259 Chronic ischemic heart disease, unspecified: Secondary | ICD-10-CM

## 2015-11-28 DIAGNOSIS — I10 Essential (primary) hypertension: Secondary | ICD-10-CM

## 2015-11-28 DIAGNOSIS — I493 Ventricular premature depolarization: Secondary | ICD-10-CM

## 2015-11-28 DIAGNOSIS — R0602 Shortness of breath: Secondary | ICD-10-CM

## 2015-11-28 NOTE — Progress Notes (Signed)
CARDIOLOGY OFFICE NOTE  Date:  11/28/2015    [name] Date of Birth: 1934/03/20 Medical Record [id number]  PCP:  Donnajean Lopes, MD  Cardiologist:  [name]    Chief Complaint  Patient presents with  . FU for PVCs, HLD, obesity and chronic dyspnea    6 month check - seen for Dr. [name]    History of Present Illness: [name] is a 82 y.o. male who presents today for a 6 month check. Seen for Dr. [name]. He has seen Dr. [name] in the remote past. He has a h/o PVC ablation 05/29/10, PAF, obesity, sedentary lifestyle, HLD, gout, OSA treated with CPAP, & dysfunctional diaphragm with chronic dyspnea followed by Dr. [name]. He has mild CAD per cath back in August of 2011. Labs are checked by primary care.   Seen back in February of 2016 by Dr. [name] - this was for evaluation of dyspnea. Dr. [name] wanted cardiac status reevaluated to assess for any cardiac issues that may be contributing to dyspnea. Was referred for Myoview and echocardiogram. These studies were satisfactory. Encouraged to cut back on his alcohol intake as well.   I last saw him in September of 2016 - seemed to be holding his own.   Comes back today. Here alone.he says he is doing ok. Not as active as he has been in the past - weight up a few pounds. Breathing is "ok". 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 No chest pain. Feels like his rhythm has been ok. Seeing Dr. [name] now for pulmonary. Sees Dr. Philip Aspen for primary care and has had recent labs. He is pleased with how he is doing.    Past Medical History  Diagnosis Date  . Hypercholesteremia   . OSA (obstructive sleep apnea)   . Dyspnea     due to paralyzed diaphragm and pulmonary issues  . Obesity   . Gout   . PVC (premature ventricular contraction)     s/p PVC ablation 05/29/2010  . HTN (hypertension)   . Coronary artery ectasia     Mild CAD with normal systolic function per cath in August of 2011  . Paroxysmal atrial fibrillation (HCC)      occured in the setting of acute E Coli sepsis and ileius 7/12  . Diaphragmatic paralysis     felt to be partially responsible for dyspnea  . BPH (benign prostatic hyperplasia)   . Microhematuria     Past Surgical History  Procedure Laterality Date  . Cholecystectomy  1990  . Appendectomy  1978  . Poly removed from nose as a child    . Pvc ablation  05/29/2010  . US echocardiography  03-09-2010    EF 55-60%  . Cardiac catheterization  04-30-2010    Left main coronary artery is normal.   . Cardiovascular stress test  03-19-2010    0%     Medications: Current Outpatient Prescriptions  Medication Sig Dispense Refill  . albuterol (PROVENTIL) (5 MG/ML) 0.5% nebulizer solution Take 2.5 mg by nebulization every 6 (six) hours as needed for shortness of breath.     . allopurinol (ZYLOPRIM) 300 MG tablet Take 300 mg by mouth daily.      Marland Kitchen amLODipine (NORVASC) 5 MG tablet Take 5 mg by mouth daily.  6  . aspirin 81 MG tablet Take 81 mg by mouth daily.      Marland Kitchen buPROPion (WELLBUTRIN SR) 150 MG 12 hr tablet Take 150 mg by mouth every morning.  12  . dicyclomine (BENTYL) 20 MG tablet Take 20 mg by mouth 2 (two) times daily.     Marland Kitchen doxazosin (CARDURA) 4 MG tablet Take 1 tablet (4 mg total) by mouth daily. 30 tablet 3  . ibuprofen (ADVIL,MOTRIN) 200 MG tablet Take 200-400 mg by mouth every 6 (six) hours as needed (body aches). For pain    . lisinopril (PRINIVIL,ZESTRIL) 40 MG tablet Take 40 mg by mouth daily.     Marland Kitchen lovastatin (MEVACOR) 40 MG tablet Take 40 mg by mouth at bedtime.      . metoprolol succinate (TOPROL-XL) 25 MG 24 hr tablet Take 25 mg by mouth 2 (two) times daily.     . sertraline (ZOLOFT) 100 MG tablet Take 100 mg by mouth daily.  5  . traZODone (DESYREL) 100 MG tablet Take 100 mg by mouth at bedtime.       No current facility-administered medications for this visit.    Allergies: No Known Allergies  Social History: The patient  reports that he quit smoking about 31 years ago.  His smoking use included Cigarettes. He has a 60 pack-year smoking history. He has never used smokeless tobacco. He reports that he drinks alcohol. He reports that he does not use illicit drugs.   Family History: The patient's family history includes Aneurysm (age of onset: 73) in his father; Breast cancer in his daughter and mother.   Review of Systems: Please see the history of present illness.   Otherwise, the review of systems is positive for none.   All other systems are reviewed and negative.   Physical Exam: VS:  BP 132/64 mmHg  Pulse 59  Ht 5\' 10"  (1.778 m)  Wt 220 lb (99.791 kg)  BMI 31.57 kg/m2 .  BMI Body mass index is 31.57 kg/(m^2).  Wt Readings from Last 3 Encounters:  11/28/15 220 lb (99.791 kg)  07/06/15 217 lb 9.6 oz (98.703 kg)  05/30/15 220 lb 12.8 oz (100.154 kg)    General: Pleasant. Well developed, well nourished and in no acute distress.  HEENT: Normal. Neck: Supple, no JVD, carotid bruits, or masses noted.  Cardiac: Regular rate and rhythm. No murmurs, rubs, or gallops. No edema.  Respiratory:  Lungs are clear to auscultation bilaterally with normal work of breathing.  GI: Soft and nontender.  MS: No deformity or atrophy. Gait and ROM intact. Skin: Warm and dry. Color is normal.  Neuro:  Strength and sensation are intact and no gross focal deficits noted.  Psych: Alert, appropriate and with normal affect.   LABORATORY DATA:  EKG:  EKG is ordered today. This demonstrates sinus brady with 1st degree AV block and RBBB.  Lab Results  Component Value Date   WBC 5.6 10/14/2011   HGB 14.6 10/14/2011   HCT 42.8 10/14/2011   PLT 142.0* 10/14/2011   GLUCOSE 93 11/04/2011   ALT 28 04/05/2011   AST 32 04/05/2011   NA 140 11/04/2011   K 4.1 11/04/2011   CL 105 11/04/2011   CREATININE 0.9 11/04/2011   BUN 30* 11/04/2011   CO2 29 11/04/2011   TSH 1.66 10/14/2011   INR 1.26 04/07/2011    BNP (last 3 results) No results for input(s): BNP in the last  8760 hours.  ProBNP (last 3 results) No results for input(s): PROBNP in the last 8760 hours.   Other Studies Reviewed Today:  Myoview Impression from March 2016 Exercise Capacity: [place] with no exercise. BP Response: Normal blood pressure response. Clinical Symptoms: Nausea.  ECG Impression: Atrial fibrillation, no changes from baseline.  Comparison with Prior Nuclear Study: No images to compare  Overall Impression: Low risk stress nuclear study a medium-sized basal inferior and basal to mid inferolateral perfusion defect that is actually worse at rest than with stress. No ischemia. Given normal wall motion, this may represent diaphragmatic attenuation. .  LV Ejection Fraction: 63%. LV Wall Motion: NL LV Function; NL Wall Motion   Loralie Champagne 11/07/2014   Echo Study Conclusions from February 2016  - Left ventricle: The cavity size was normal. Wall thickness was increased in a pattern of mild LVH. Systolic function was normal. The estimated ejection fraction was in the range of 60% to 65%. Wall motion was normal; there were no regional wall motion abnormalities. - Aortic valve: There was trivial regurgitation. - Left atrium: The atrium was mildly dilated. - Right atrium: The atrium was mildly dilated.  Assessment/Plan: 1. Dyspnea - stable. His cardiac studies from 2016 were stable.   2. PVCs - past ablation - no problems reported.   3. HTN - BP looks good on current regimen. No changes made today.  4. Obesity - encouraged him to really work on diet/try to increase activity, etc.  5. Depression - long standing issue  6. HLD - on statin - he has his labs with PCP  Current medicines are reviewed with the patient today.  The patient does not have concerns regarding medicines other than what has been noted above.  The following changes have been made:  See above.  Labs/ tests ordered today include:   No orders of the defined types were placed in  this encounter.     Disposition:   FU with me in 6 months.   Patient is agreeable to this plan and will call if any problems develop in the interim.   Signed: Burtis Junes, RN, ANP-C 11/28/2015 8:17 AM  [facility] [address] [place], [place]  [address] Phone: [phone] Fax: [phone]

## 2015-11-28 NOTE — Patient Instructions (Addendum)
We will be checking the following labs today - NONE   Medication Instructions:    Continue with your current medicines.     Testing/Procedures To Be Arranged:  N/A  Follow-Up:   See me in 6 months.     Other Special Instructions:   Stay active!    If you need a refill on your cardiac medications before your next appointment, please call your pharmacy.   Call the [facility] office at [phone] if you have any questions, problems or concerns.      

## 2016-06-04 ENCOUNTER — Encounter: Payer: Self-pay | Admitting: Nurse Practitioner

## 2016-06-04 ENCOUNTER — Ambulatory Visit (INDEPENDENT_AMBULATORY_CARE_PROVIDER_SITE_OTHER): Payer: Medicare Other | Admitting: Nurse Practitioner

## 2016-06-04 VITALS — BP 152/80 | HR 62 | Ht 70.0 in | Wt 219.1 lb

## 2016-06-04 DIAGNOSIS — I493 Ventricular premature depolarization: Secondary | ICD-10-CM | POA: Diagnosis not present

## 2016-06-04 DIAGNOSIS — I259 Chronic ischemic heart disease, unspecified: Secondary | ICD-10-CM

## 2016-06-04 DIAGNOSIS — I10 Essential (primary) hypertension: Secondary | ICD-10-CM | POA: Diagnosis not present

## 2016-06-04 DIAGNOSIS — R0602 Shortness of breath: Secondary | ICD-10-CM

## 2016-06-04 DIAGNOSIS — E663 Overweight: Secondary | ICD-10-CM

## 2016-06-04 NOTE — Progress Notes (Signed)
CARDIOLOGY OFFICE NOTE  Date:  06/04/2016    [name] Date of Birth: [birth year] Medical Record [id number]  PCP:  Drew Lopes, MD  Cardiologist:  [place]    Chief Complaint  Patient presents with  . Irregular Heart Beat  . Shortness of Breath    6 month check - seen for Dr. [name]    History of Present Illness: [name] is a 82 y.o. male who presents today for a 6 month check. Seen for Dr. [name]. He has seen Dr. [name] in the remote past.   He has a h/o PVC ablation 05/29/10, PAF, obesity, sedentary lifestyle, HLD, gout, OSA treated with CPAP, & dysfunctional diaphragm with chronic dyspnea followed by Dr. [name]. He has mild CAD per cath back in August of 2011. Labs are checked by primary care.   Seen back in February of 2016 by Dr. [name] - this was for evaluation of dyspnea. Dr. [name] wanted cardiac status reevaluated to assess for any cardiac issues that may be contributing to dyspnea. Was referred for Myoview and echocardiogram. These studies were satisfactory. Encouraged to cut back on his alcohol intake as well.   I last saw him in March of 2017 - seemed to be holding his own.   Comes back today. Here alone.Doing ok. Remains short of breath but says he is at his baseline. Oxygen sat down to 88% here this AM. He feels like he is doing ok. Weight is fairly stable - has not lost any. BP up some here today but better at home. Labs are checked by PCP - due to see him again next month. He has had balance issues and has had some falls. No serious injury. Does not use a cane/walker.   Past Medical History:  Diagnosis Date  . BPH (benign prostatic hyperplasia)   . Coronary artery ectasia    Mild CAD with normal systolic function per cath in August of 2011  . Diaphragmatic paralysis    felt to be partially responsible for dyspnea  . Dyspnea    due to paralyzed diaphragm and pulmonary issues  . Gout   . HTN (hypertension)   .  Hypercholesteremia   . Microhematuria   . Obesity   . OSA (obstructive sleep apnea)   . Paroxysmal atrial fibrillation (HCC)    occured in the setting of acute E Coli sepsis and ileius 7/12  . PVC (premature ventricular contraction)    s/p PVC ablation 05/29/2010    Past Surgical History:  Procedure Laterality Date  . APPENDECTOMY  1978  . CARDIAC CATHETERIZATION  04-30-2010   Left main coronary artery is normal.   . CARDIOVASCULAR STRESS TEST  03-19-2010   0%  . CHOLECYSTECTOMY  1990  . poly removed from nose as a child    . PVC ablation  05/29/2010  . US ECHOCARDIOGRAPHY  03-09-2010   EF 55-60%     Medications: Current Outpatient Prescriptions  Medication Sig Dispense Refill  . albuterol (PROVENTIL) (5 MG/ML) 0.5% nebulizer solution Take 2.5 mg by nebulization every 6 (six) hours as needed for shortness of breath.     . allopurinol (ZYLOPRIM) 300 MG tablet Take 300 mg by mouth daily.      Marland Kitchen amLODipine (NORVASC) 5 MG tablet Take 5 mg by mouth daily.  6  . aspirin 81 MG tablet Take 81 mg by mouth daily.      Marland Kitchen buPROPion (WELLBUTRIN SR) 150 MG 12 hr tablet Take 150  mg by mouth every morning.  12  . dicyclomine (BENTYL) 20 MG tablet Take 20 mg by mouth 2 (two) times daily.     Marland Kitchen doxazosin (CARDURA) 4 MG tablet Take 1 tablet (4 mg total) by mouth daily. 30 tablet 3  . ibuprofen (ADVIL,MOTRIN) 200 MG tablet Take 200-400 mg by mouth every 6 (six) hours as needed (body aches). For pain    . lisinopril (PRINIVIL,ZESTRIL) 40 MG tablet Take 40 mg by mouth daily.     Marland Kitchen lovastatin (MEVACOR) 40 MG tablet Take 40 mg by mouth at bedtime.      . metoprolol succinate (TOPROL-XL) 25 MG 24 hr tablet Take 25 mg by mouth 2 (two) times daily.     . sertraline (ZOLOFT) 100 MG tablet Take 100 mg by mouth daily.  5  . traZODone (DESYREL) 100 MG tablet Take 100 mg by mouth at bedtime.       No current facility-administered medications for this visit.     Allergies: No Known Allergies  Social  History: The patient  reports that he quit smoking about 31 years ago. His smoking use included Cigarettes. He has a 60.00 pack-year smoking history. He has never used smokeless tobacco. He reports that he drinks alcohol. He reports that he does not use drugs.   Family History: The patient's family history includes Aneurysm (age of onset: 63) in his father; Breast cancer in his daughter and mother.   Review of Systems: Please see the history of present illness.   Otherwise, the review of systems is positive for none.   All other systems are reviewed and negative.   Physical Exam: VS:  BP (!) 152/80   Pulse 62   Ht 5\' 10"  (1.778 m)   Wt 219 lb 1.9 oz (99.4 kg)   SpO2 (!) 88% Comment: sitting 91 with deep breaths  BMI 31.44 kg/m  .  BMI Body mass index is 31.44 kg/m.  Wt Readings from Last 3 Encounters:  06/04/16 219 lb 1.9 oz (99.4 kg)  11/28/15 220 lb (99.8 kg)  07/06/15 217 lb 9.6 oz (98.7 kg)    General: Pleasant. Elderly obese male who is alert and in no acute distress.   HEENT: Normal.  Neck: Supple, no JVD, carotid bruits, or masses noted.  Cardiac: Regular rate and rhythm. Soft outflow murmur. His heart tones are distant. No edema.  Respiratory:  Lungs with decreased breath sounds but with normal work of breathing.  GI: Soft and nontender.  MS: No deformity or atrophy. Gait and ROM intact.  Skin: Warm and dry. Color is normal.  Neuro:  Strength and sensation are intact and no gross focal deficits noted.  Psych: Alert, appropriate and with normal affect.   LABORATORY DATA:  EKG:  EKG is not ordered today.   Lab Results  Component Value Date   WBC 5.6 10/14/2011   HGB 14.6 10/14/2011   HCT 42.8 10/14/2011   PLT 142.0 (L) 10/14/2011   GLUCOSE 93 11/04/2011   ALT 28 04/05/2011   AST 32 04/05/2011   NA 140 11/04/2011   K 4.1 11/04/2011   CL 105 11/04/2011   CREATININE 0.9 11/04/2011   BUN 30 (H) 11/04/2011   CO2 29 11/04/2011   TSH 1.66 10/14/2011   INR 1.26  04/07/2011    BNP (last 3 results) No results for input(s): BNP in the last 8760 hours.  ProBNP (last 3 results) No results for input(s): PROBNP in the last 8760 hours.   Other  Studies Reviewed Today:  Myoview Impression from March 2016 Exercise Capacity: [place] with no exercise. BP Response: Normal blood pressure response. Clinical Symptoms: Nausea. ECG Impression: Atrial fibrillation, no changes from baseline.  Comparison with Prior Nuclear Study: No images to compare  Overall Impression: Low risk stress nuclear study a medium-sized basal inferior and basal to mid inferolateral perfusion defect that is actually worse at rest than with stress. No ischemia. Given normal wall motion, this may represent diaphragmatic attenuation. .  LV Ejection Fraction: 63%. LV Wall Motion: NL LV Function; NL Wall Motion   Loralie Champagne 11/07/2014   Echo Study Conclusions from February 2016  - Left ventricle: The cavity size was normal. Wall thickness was increased in a pattern of mild LVH. Systolic function was normal. The estimated ejection fraction was in the range of 60% to 65%. Wall motion was normal; there were no regional wall motion abnormalities. - Aortic valve: There was trivial regurgitation. - Left atrium: The atrium was mildly dilated. - Right atrium: The atrium was mildly dilated.  Assessment/Plan: 1. Dyspnea - stable. His cardiac studies from 2016 were stable. Sees pulmonary annually.    2. PVCs - past ablation - no problems reported. Regular rhythm on exam today.   3. HTN - BP better at home - I have asked him to continue to monitor. No changes made today.  4. Obesity - encouraged him to really work on diet/try to increase activity, etc. Unfortunately, I don't really see this changing.   5. Depression - long standing issue  6. HLD - on statin - he has his labs with PCP  Current medicines are reviewed with the patient today.  The patient  does not have concerns regarding medicines other than what has been noted above.  The following changes have been made:  See above.  Labs/ tests ordered today include:   No orders of the defined types were placed in this encounter.    Disposition:   FU with me in 6 months.   Patient is agreeable to this plan and will call if any problems develop in the interim.   Signed: Burtis Junes, RN, ANP-C 06/04/2016 8:15 AM  [facility] [address] [place], [place]  [address] Phone: [phone] Fax: [phone]

## 2016-06-04 NOTE — Patient Instructions (Addendum)
We will be checking the following labs today - NONE   Medication Instructions:    Continue with your current medicines.     Testing/Procedures To Be Arranged:  N/A  Follow-Up:   See me in 6 months    Other Special Instructions:   Try to be as active as you can    If you need a refill on your cardiac medications before your next appointment, please call your pharmacy.   Call the [facility] office at [phone] if you have any questions, problems or concerns.      

## 2016-11-18 NOTE — Progress Notes (Deleted)
CARDIOLOGY OFFICE NOTE  Date:  11/18/2016    [name] Date of Birth: Jan 22, 1934 Medical Record #[id number]  PCP:  Donnajean Lopes, MD  Cardiologist:  [name] & ***    No chief complaint on file.   History of Present Illness: [name] is a 83 y.o. male who presents today for a ***   Comes in today. Here with   Past Medical History:  Diagnosis Date  . BPH (benign prostatic hyperplasia)   . Coronary artery ectasia    Mild CAD with normal systolic function per cath in August of 2011  . Diaphragmatic paralysis    felt to be partially responsible for dyspnea  . Dyspnea    due to paralyzed diaphragm and pulmonary issues  . Gout   . HTN (hypertension)   . Hypercholesteremia   . Microhematuria   . Obesity   . OSA (obstructive sleep apnea)   . Paroxysmal atrial fibrillation (HCC)    occured in the setting of acute E Coli sepsis and ileius 7/12  . PVC (premature ventricular contraction)    s/p PVC ablation 05/29/2010    Past Surgical History:  Procedure Laterality Date  . APPENDECTOMY  1978  . CARDIAC CATHETERIZATION  04-30-2010   Left main coronary artery is normal.   . CARDIOVASCULAR STRESS TEST  03-19-2010   0%  . CHOLECYSTECTOMY  1990  . poly removed from nose as a child    . PVC ablation  05/29/2010  . US ECHOCARDIOGRAPHY  03-09-2010   EF 55-60%     Medications: Current Outpatient Prescriptions  Medication Sig Dispense Refill  . albuterol (PROVENTIL) (5 MG/ML) 0.5% nebulizer solution Take 2.5 mg by nebulization every 6 (six) hours as needed for shortness of breath.     . allopurinol (ZYLOPRIM) 300 MG tablet Take 300 mg by mouth daily.      Marland Kitchen amLODipine (NORVASC) 5 MG tablet Take 5 mg by mouth daily.  6  . aspirin 81 MG tablet Take 81 mg by mouth daily.      Marland Kitchen buPROPion (WELLBUTRIN SR) 150 MG 12 hr tablet Take 150 mg by mouth every morning.  12  . dicyclomine (BENTYL) 20 MG tablet Take 20 mg by mouth 2 (two) times daily.     Marland Kitchen doxazosin  (CARDURA) 4 MG tablet Take 1 tablet (4 mg total) by mouth daily. 30 tablet 3  . ibuprofen (ADVIL,MOTRIN) 200 MG tablet Take 200-400 mg by mouth every 6 (six) hours as needed (body aches). For pain    . lisinopril (PRINIVIL,ZESTRIL) 40 MG tablet Take 40 mg by mouth daily.     Marland Kitchen lovastatin (MEVACOR) 40 MG tablet Take 40 mg by mouth at bedtime.      . metoprolol succinate (TOPROL-XL) 25 MG 24 hr tablet Take 25 mg by mouth 2 (two) times daily.     . sertraline (ZOLOFT) 100 MG tablet Take 100 mg by mouth daily.  5  . traZODone (DESYREL) 100 MG tablet Take 100 mg by mouth at bedtime.       No current facility-administered medications for this visit.     Allergies: No Known Allergies  Social History: The patient  reports that he quit smoking about 32 years ago. His smoking use included Cigarettes. He has a 60.00 pack-year smoking history. He has never used smokeless tobacco. He reports that he drinks alcohol. He reports that he does not use drugs.   Family History: The patient's ***family history includes Aneurysm (age  of onset: 34) in his father; Breast cancer in his daughter and mother.   Review of Systems: Please see the history of present illness.   Otherwise, the review of systems is positive for {NONE DEFAULTED:18576::"none"}.   All other systems are reviewed and negative.   Physical Exam: VS:  There were no vitals taken for this visit. Marland Kitchen  BMI There is no height or weight on file to calculate BMI.  Wt Readings from Last 3 Encounters:  06/04/16 219 lb 1.9 oz (99.4 kg)  11/28/15 220 lb (99.8 kg)  07/06/15 217 lb 9.6 oz (98.7 kg)    General: Pleasant. Well developed, well nourished and in no acute distress.   HEENT: Normal.  Neck: Supple, no JVD, carotid bruits, or masses noted.  Cardiac: ***Regular rate and rhythm. No murmurs, rubs, or gallops. No edema.  Respiratory:  Lungs are clear to auscultation bilaterally with normal work of breathing.  GI: Soft and nontender.  MS: No  deformity or atrophy. Gait and ROM intact.  Skin: Warm and dry. Color is normal.  Neuro:  Strength and sensation are intact and no gross focal deficits noted.  Psych: Alert, appropriate and with normal affect.   LABORATORY DATA:  EKG:  EKG {ACTION; IS/IS MEQ:[id number]} ordered today. This demonstrates ***.  Lab Results  Component Value Date   WBC 5.6 10/14/2011   HGB 14.6 10/14/2011   HCT 42.8 10/14/2011   PLT 142.0 (L) 10/14/2011   GLUCOSE 93 11/04/2011   ALT 28 04/05/2011   AST 32 04/05/2011   NA 140 11/04/2011   K 4.1 11/04/2011   CL 105 11/04/2011   CREATININE 0.9 11/04/2011   BUN 30 (H) 11/04/2011   CO2 29 11/04/2011   TSH 1.66 10/14/2011   INR 1.26 04/07/2011    BNP (last 3 results) No results for input(s): BNP in the last 8760 hours.  ProBNP (last 3 results) No results for input(s): PROBNP in the last 8760 hours.   Other Studies Reviewed Today:   Assessment/Plan:   Current medicines are reviewed with the patient today.  The patient does not have concerns regarding medicines other than what has been noted above.  The following changes have been made:  See above.  Labs/ tests ordered today include:   No orders of the defined types were placed in this encounter.    Disposition:   FU with *** in {gen number 2-29:798921} {Days to years:10300}.   Patient is agreeable to this plan and will call if any problems develop in the interim.   SignedTruitt Merle, NP  11/18/2016 12:42 PM  [facility] [address] [place],   [address] Phone: [phone] Fax: [phone]

## 2016-11-19 ENCOUNTER — Ambulatory Visit: Payer: Medicare Other | Admitting: Nurse Practitioner

## 2016-12-02 ENCOUNTER — Ambulatory Visit: Payer: Medicare Other | Admitting: Nurse Practitioner

## 2016-12-09 ENCOUNTER — Ambulatory Visit (INDEPENDENT_AMBULATORY_CARE_PROVIDER_SITE_OTHER): Payer: Medicare Other | Admitting: Nurse Practitioner

## 2016-12-09 ENCOUNTER — Encounter: Payer: Self-pay | Admitting: Nurse Practitioner

## 2016-12-09 VITALS — BP 160/84 | HR 55 | Ht 70.0 in | Wt 222.8 lb

## 2016-12-09 DIAGNOSIS — I493 Ventricular premature depolarization: Secondary | ICD-10-CM | POA: Diagnosis not present

## 2016-12-09 DIAGNOSIS — R0602 Shortness of breath: Secondary | ICD-10-CM

## 2016-12-09 DIAGNOSIS — I259 Chronic ischemic heart disease, unspecified: Secondary | ICD-10-CM

## 2016-12-09 DIAGNOSIS — I10 Essential (primary) hypertension: Secondary | ICD-10-CM

## 2016-12-09 NOTE — Progress Notes (Signed)
CARDIOLOGY OFFICE NOTE  Date:  12/09/2016    [name] Date of Birth: [birth year] Medical Record #[id number]  PCP:  Donnajean Lopes, MD  Cardiologist:  [place]  Chief Complaint  Patient presents with  . Coronary Artery Disease    6 month check - seen for Dr. [name]    History of Present Illness: [name] is a 83 y.o. male who presents today for a 6 month check. Seen for Dr. [name]. He has seen Dr. [name] in the remote past.   He has a h/o PVC ablation 05/29/10, PAF, obesity, sedentary lifestyle, HLD, gout, OSA treated with CPAP, &dysfunctional diaphragm with chronic dyspnea followed by Dr. [name]. He has mild CAD per cath back in August of 2011. Labs are checked by primary care.   Seen back in February of 2016 by Dr. [name] - this was for evaluation of dyspnea. Dr. [name] wanted cardiac status reevaluated to assess for any cardiac issues that may be contributing to dyspnea. Was referred for Myoview and echocardiogram. These studies were satisfactory. Encouraged to cut back on his alcohol intake as well.   I have followed him since - he has been holding his own - dyspnea is chronic. Balance issues and has had some falls. Rhythm has been stable. Last seen back in September.   Comes back today. Here alone.Not moving as much as has. No recent BP checks at home. Sees PCP next month - says there is some type of activity program that Dr. Philip Aspen was going to talk with him about. No chest pain. His breathing is at his baseline - no longer using his inhaler - says that has not helped. Not seeing pulmonary any more either. Rhythm ok. Notes "just getting older".   Past Medical History:  Diagnosis Date  . BPH (benign prostatic hyperplasia)   . Coronary artery ectasia    Mild CAD with normal systolic function per cath in August of 2011  . Diaphragmatic paralysis    felt to be partially responsible for dyspnea  . Dyspnea    due to paralyzed diaphragm and  pulmonary issues  . Gout   . HTN (hypertension)   . Hypercholesteremia   . Microhematuria   . Obesity   . OSA (obstructive sleep apnea)   . Paroxysmal atrial fibrillation (HCC)    occured in the setting of acute E Coli sepsis and ileius 7/12  . PVC (premature ventricular contraction)    s/p PVC ablation 05/29/2010    Past Surgical History:  Procedure Laterality Date  . APPENDECTOMY  1978  . CARDIAC CATHETERIZATION  04-30-2010   Left main coronary artery is normal.   . CARDIOVASCULAR STRESS TEST  03-19-2010   0%  . CHOLECYSTECTOMY  1990  . poly removed from nose as a child    . PVC ablation  05/29/2010  . US ECHOCARDIOGRAPHY  03-09-2010   EF 55-60%     Medications: Current Outpatient Prescriptions  Medication Sig Dispense Refill  . albuterol (PROVENTIL) (5 MG/ML) 0.5% nebulizer solution Take 2.5 mg by nebulization every 6 (six) hours as needed for shortness of breath.     . allopurinol (ZYLOPRIM) 300 MG tablet Take 300 mg by mouth daily.      Marland Kitchen amLODipine (NORVASC) 5 MG tablet Take 5 mg by mouth daily.  6  . aspirin 81 MG tablet Take 81 mg by mouth daily.      Marland Kitchen buPROPion (WELLBUTRIN SR) 150 MG 12 hr tablet Take 150  mg by mouth every morning.  12  . dicyclomine (BENTYL) 20 MG tablet Take 20 mg by mouth 2 (two) times daily.     Marland Kitchen doxazosin (CARDURA) 4 MG tablet Take 1 tablet (4 mg total) by mouth daily. 30 tablet 3  . ibuprofen (ADVIL,MOTRIN) 200 MG tablet Take 200-400 mg by mouth every 6 (six) hours as needed (body aches). For pain    . lisinopril (PRINIVIL,ZESTRIL) 40 MG tablet Take 40 mg by mouth daily.     Marland Kitchen lovastatin (MEVACOR) 40 MG tablet Take 40 mg by mouth at bedtime.      . metoprolol succinate (TOPROL-XL) 25 MG 24 hr tablet Take 25 mg by mouth 2 (two) times daily.     . sertraline (ZOLOFT) 100 MG tablet Take 100 mg by mouth daily.  5  . traZODone (DESYREL) 100 MG tablet Take 100 mg by mouth at bedtime.       No current facility-administered medications for this visit.      Allergies: No Known Allergies  Social History: The patient  reports that he quit smoking about 32 years ago. His smoking use included Cigarettes. He has a 60.00 pack-year smoking history. He has never used smokeless tobacco. He reports that he drinks alcohol. He reports that he does not use drugs.   Family History: The patient's family history includes Aneurysm (age of onset: 6) in his father; Breast cancer in his daughter and mother.   Review of Systems: Please see the history of present illness.   Otherwise, the review of systems is positive for none.   All other systems are reviewed and negative.   Physical Exam: VS:  BP (!) 160/84   Pulse (!) 55   Ht 5\' 10"  (1.778 m)   Wt 222 lb 12.8 oz (101.1 kg)   BMI 31.97 kg/m  .  BMI Body mass index is 31.97 kg/m.  Wt Readings from Last 3 Encounters:  12/09/16 222 lb 12.8 oz (101.1 kg)  06/04/16 219 lb 1.9 oz (99.4 kg)  11/28/15 220 lb (99.8 kg)   BP by me is down to 140/60  General: Pleasant. Obese male. Affect flat but he is alert and no acute distress.   HEENT: Normal.  Neck: Supple, no JVD, carotid bruits, or masses noted.  Cardiac: Regular rate and rhythm. No murmurs, rubs, or gallops. No edema.  Respiratory:  Decreased breath sounds noted with normal work of breathing.  GI: Soft and nontender.  MS: No deformity or atrophy. Gait and ROM intact.  Skin: Warm and dry. Color is normal.  Neuro:  Strength and sensation are intact and no gross focal deficits noted.  Psych: Alert, appropriate and with normal affect.   LABORATORY DATA:  EKG:  EKG is ordered today. This demonstrates sinus brady with 1st degree AV block and RBBB - unchanged.  Lab Results  Component Value Date   WBC 5.6 10/14/2011   HGB 14.6 10/14/2011   HCT 42.8 10/14/2011   PLT 142.0 (L) 10/14/2011   GLUCOSE 93 11/04/2011   ALT 28 04/05/2011   AST 32 04/05/2011   NA 140 11/04/2011   K 4.1 11/04/2011   CL 105 11/04/2011   CREATININE 0.9 11/04/2011    BUN 30 (H) 11/04/2011   CO2 29 11/04/2011   TSH 1.66 10/14/2011   INR 1.26 04/07/2011    BNP (last 3 results) No results for input(s): BNP in the last 8760 hours.  ProBNP (last 3 results) No results for input(s): PROBNP in the last  8760 hours.   Other Studies Reviewed Today:  Myoview Impression from March 2016 Exercise Capacity: [place] with no exercise. BP Response: Normal blood pressure response. Clinical Symptoms: Nausea. ECG Impression: Atrial fibrillation, no changes from baseline.  Comparison with Prior Nuclear Study: No images to compare  Overall Impression: Low risk stress nuclear study a medium-sized basal inferior and basal to mid inferolateral perfusion defect that is actually worse at rest than with stress. No ischemia. Given normal wall motion, this may represent diaphragmatic attenuation. .  LV Ejection Fraction: 63%. LV Wall Motion: NL LV Function; NL Wall Motion   Loralie Champagne 11/07/2014   Echo Study Conclusions from February 2016  - Left ventricle: The cavity size was normal. Wall thickness was increased in a pattern of mild LVH. Systolic function was normal. The estimated ejection fraction was in the range of 60% to 65%. Wall motion was normal; there were no regional wall motion abnormalities. - Aortic valve: There was trivial regurgitation. - Left atrium: The atrium was mildly dilated. - Right atrium: The atrium was mildly dilated.  Assessment/Plan:  1. Dyspnea - seems stable. His cardiac studies from 2016 were stable. Was to see pulmonary annually - but has not been there since 2016 - he seems to be holding his own. Not really much to offer - wish I could get him more mobile.   2. PVCs - past ablation - no problems reported. Regular rhythm on exam today and by EKG.   3. HTN - recheck by me is better - I have asked him to continue to monitor and show his readings to Dr. Philip Aspen next month.   4. Obesity - encouraged him  to really work on diet/try to increase activity, etc. Unfortunately, I don't really see this changing.   5. Depression - long standing issue  6. HLD - on statin - labs checked by PCP  Current medicines are reviewed with the patient today.  The patient does not have concerns regarding medicines other than what has been noted above.  The following changes have been made:  See above.  Labs/ tests ordered today include:    Orders Placed This Encounter  Procedures  . EKG 12-Lead     Disposition:   FU with me in 6 months.   Patient is agreeable to this plan and will call if any problems develop in the interim.   SignedTruitt Merle, NP  12/09/2016 1:45 PM  [facility] [address] [place],   [address] Phone: [phone] Fax: [phone]

## 2016-12-09 NOTE — Patient Instructions (Addendum)
We will be checking the following labs today - NONE   Medication Instructions:    Continue with your current medicines.     Testing/Procedures To Be Arranged:  N/A  Follow-Up:   See me in 6 months.     Other Special Instructions:   Monitor some blood pressures for me at home and show to Dr. Philip Aspen when you see him next month.  Try to use the treadmill more    If you need a refill on your cardiac medications before your next appointment, please call your pharmacy.   Call the [place] office at [phone] if you have any questions, problems or concerns.

## 2017-02-05 ENCOUNTER — Telehealth: Payer: Self-pay | Admitting: Nurse Practitioner

## 2017-02-05 NOTE — Telephone Encounter (Signed)
New message    Pt is calling asking for a call from Rn. He wants to talk about a medication from PCP.

## 2017-02-06 NOTE — Telephone Encounter (Signed)
Pt stated Dr. [name] took pt off of Ritalin since having several ablations.  Pt has not energy and would like to go back on this medication.  Wanted to make sure this was ok.  Will send to Cecille Rubin to advise.

## 2017-02-06 NOTE — Telephone Encounter (Signed)
I would defer this to Dr. [name] - I personally do not feel like this is a good choice for him.

## 2017-02-07 NOTE — Telephone Encounter (Signed)
S/w pt is aware Cecille Rubin stated not a good choice to start back on ridilin but Cecille Rubin wanted Dr. [name] to review. Stated should be hearing from someone in our office regarding pt's question but if not call office back in a few days. Pt stated appreciation for call.

## 2017-02-17 NOTE — Telephone Encounter (Signed)
Agree with Cecille Rubin Would avoid if able

## 2017-02-18 NOTE — Telephone Encounter (Signed)
Patient made aware of recommendations, verbalized understanding.

## 2017-02-26 ENCOUNTER — Telehealth: Payer: Self-pay | Admitting: *Deleted

## 2017-02-26 NOTE — Telephone Encounter (Signed)
Faxing to Dr. Philip Aspen office @ [phone] Lori's recommendation's for cardiac rehab. Needs a pulmonary referral.

## 2017-03-04 ENCOUNTER — Ambulatory Visit (INDEPENDENT_AMBULATORY_CARE_PROVIDER_SITE_OTHER): Payer: Medicare Other | Admitting: Pulmonary Disease

## 2017-03-04 ENCOUNTER — Encounter: Payer: Self-pay | Admitting: Pulmonary Disease

## 2017-03-04 VITALS — BP 124/68 | HR 50 | Ht 70.0 in | Wt 217.0 lb

## 2017-03-04 DIAGNOSIS — J986 Disorders of diaphragm: Secondary | ICD-10-CM

## 2017-03-04 DIAGNOSIS — R2681 Unsteadiness on feet: Secondary | ICD-10-CM | POA: Diagnosis not present

## 2017-03-04 DIAGNOSIS — G4733 Obstructive sleep apnea (adult) (pediatric): Secondary | ICD-10-CM

## 2017-03-04 DIAGNOSIS — J432 Centrilobular emphysema: Secondary | ICD-10-CM

## 2017-03-04 NOTE — Addendum Note (Signed)
Addended by: Len Blalock on: 03/04/2017 04:41 PM   Modules accepted: Orders

## 2017-03-04 NOTE — Progress Notes (Signed)
Subjective:    Patient ID: [name], male    DOB: 25-Dec-1933, 83 y.o.   MRN: [id number]  Synopsis: Former patient of Dr. [name] who is referred in 2015 for shortness of breath. Found to have evidence of diaphragm weakness as well as COPD. Also treated for obstructive sleep apnea.   HPI Chief Complaint  Patient presents with  . Acute Visit    pt last seen 06/2015- c/o worsening sob with any exertion.  Denies cough, mucus congestion, CP.  Pt has a bipap machine- bought it (does not go through DME for maintenance).     Sahid would like to go to pulmonary rehab.   He says that he doesn't have any energy but he lives a fairly sedentary life.  He tries to do a treadmill a few times per week, but it is slow going and he can only do 10-12 minutes while on it. He is hoping to get into pulmonary rehab.   He feels a little unsteady and has had a few falls. In general he feels like his body is getting weaker, not necessarily his lungs.  He says that he hasn't really felt right since his small bowel obstruction a few years back.   No bronchitis or pneumonia since the last visit. He continues to use his CPAP nightly.     Past Medical History:  Diagnosis Date  . BPH (benign prostatic hyperplasia)   . Coronary artery ectasia    Mild CAD with normal systolic function per cath in August of 2011  . Diaphragmatic paralysis    felt to be partially responsible for dyspnea  . Dyspnea    due to paralyzed diaphragm and pulmonary issues  . Gout   . HTN (hypertension)   . Hypercholesteremia   . Microhematuria   . Obesity   . OSA (obstructive sleep apnea)   . Paroxysmal atrial fibrillation (HCC)    occured in the setting of acute E Coli sepsis and ileius 7/12  . PVC (premature ventricular contraction)    s/p PVC ablation 05/29/2010      Review of Systems  Constitutional: Negative for chills, diaphoresis and fatigue.  HENT: Negative for rhinorrhea, sinus pressure and sneezing.     Respiratory: Positive for shortness of breath. Negative for cough and wheezing.   Cardiovascular: Negative for chest pain, palpitations and leg swelling.       Objective:   Physical Exam Vitals:   03/04/17 1543  BP: 124/68  Pulse: (!) 50  SpO2: 95%  Weight: 217 lb (98.4 kg)  Height: 5\' 10"  (1.778 m)   RA  Gen: well appearing HENT: OP clear, TM's clear, neck supple PULM: CTA B, normal percussion CV: RRR, no mgr, trace edema GI: BS+, soft, nontender Derm: no cyanosis or rash Psyche: normal mood and affect   PFT PFT's 08/2014:  FEV1 0.92 (31%), ratio 55, decrease in muscle pressures, mild restriction, could not do DLCO maneuver.  CXR, CT abdomen, and diaphragm fluoroscopy images personally reviewed today in clinic, mild left hemidiaphragm elevation noted.     Assessment & Plan:  Obstructive sleep apnea Continue CPAP nightly Because of his worsening fatigue we will check an overnight oximetry test on CPAP.  Disorder of diaphragm Continue CPAP nightly Lose weight is much as possible. Diet and exercise  COPD (chronic obstructive pulmonary disease) with emphysema ([place]) Though he has severe airflow obstruction he remains asymptomatic. He notes no new chest tightness, wheezing, shortness of breath or chest congestion. He says that  he does not miss Seeley femoris short of breath he just feels weaker and he would like to participate in pulmonary rehabilitation. This is reasonable.  Plan: Continue to monitor without medical therapy Start pulmonary rehabilitation  > 50% of this 26 minute visit was face to face   Current Outpatient Prescriptions:  .  allopurinol (ZYLOPRIM) 300 MG tablet, Take 300 mg by mouth daily.  , Disp: , Rfl:  .  amLODipine (NORVASC) 5 MG tablet, Take 5 mg by mouth daily., Disp: , Rfl: 6 .  aspirin 81 MG tablet, Take 81 mg by mouth daily.  , Disp: , Rfl:  .  buPROPion (WELLBUTRIN SR) 150 MG 12 hr tablet, Take 150 mg by mouth every morning., Disp: ,  Rfl: 12 .  dicyclomine (BENTYL) 20 MG tablet, Take 20 mg by mouth 2 (two) times daily. , Disp: , Rfl:  .  doxazosin (CARDURA) 4 MG tablet, Take 1 tablet (4 mg total) by mouth daily., Disp: 30 tablet, Rfl: 3 .  ibuprofen (ADVIL,MOTRIN) 200 MG tablet, Take 200-400 mg by mouth every 6 (six) hours as needed (body aches). For pain, Disp: , Rfl:  .  lisinopril (PRINIVIL,ZESTRIL) 40 MG tablet, Take 40 mg by mouth daily. , Disp: , Rfl:  .  lovastatin (MEVACOR) 40 MG tablet, Take 40 mg by mouth at bedtime.  , Disp: , Rfl:  .  metoprolol succinate (TOPROL-XL) 25 MG 24 hr tablet, Take 25 mg by mouth 2 (two) times daily. , Disp: , Rfl:  .  sertraline (ZOLOFT) 100 MG tablet, Take 100 mg by mouth daily., Disp: , Rfl: 5 .  traZODone (DESYREL) 100 MG tablet, Take 100 mg by mouth at bedtime.  , Disp: , Rfl:

## 2017-03-04 NOTE — Assessment & Plan Note (Signed)
Continue CPAP nightly Lose weight is much as possible. Diet and exercise

## 2017-03-04 NOTE — Assessment & Plan Note (Signed)
Continue CPAP nightly Because of his worsening fatigue we will check an overnight oximetry test on CPAP.

## 2017-03-04 NOTE — Assessment & Plan Note (Signed)
Though he has severe airflow obstruction he remains asymptomatic. He notes no new chest tightness, wheezing, shortness of breath or chest congestion. He says that he does not miss Seeley femoris short of breath he just feels weaker and he would like to participate in pulmonary rehabilitation. This is reasonable.  Plan: Continue to monitor without medical therapy Start pulmonary rehabilitation

## 2017-03-04 NOTE — Patient Instructions (Signed)
For your obstructive sleep apnea: Keep using CPAP nightly We will check an overnight oximetry test while you are wearing CPAP  For your muscular deconditioning with centrilobular emphysema: We will refer you to pulmonary rehabilitation If this is too expensive then we can come up with an alternative option for you  We'll see back in about 2 months to see how you are doing

## 2017-03-05 ENCOUNTER — Telehealth: Payer: Self-pay | Admitting: Pulmonary Disease

## 2017-03-05 DIAGNOSIS — G4733 Obstructive sleep apnea (adult) (pediatric): Secondary | ICD-10-CM

## 2017-03-05 NOTE — Telephone Encounter (Signed)
Pt cannot have an ONO d/t dx of OSA.  Pt must have an in-lab sleep study per Corene Cornea at [facility].  BQ please advise on what sleep study is needed.  Thanks!

## 2017-03-06 NOTE — Telephone Encounter (Signed)
cpap titration

## 2017-03-06 NOTE — Telephone Encounter (Signed)
Pt aware that titration is needed and agreed to sleep study. cpap titration has been ordered. Lm to make [facility] aware that study has been ordered Nothing further needed at this time.

## 2017-03-10 ENCOUNTER — Ambulatory Visit (HOSPITAL_BASED_OUTPATIENT_CLINIC_OR_DEPARTMENT_OTHER): Payer: Medicare Other | Attending: Pulmonary Disease | Admitting: Pulmonary Disease

## 2017-03-10 VITALS — Ht 70.0 in | Wt 217.0 lb

## 2017-03-10 DIAGNOSIS — G4733 Obstructive sleep apnea (adult) (pediatric): Secondary | ICD-10-CM

## 2017-03-21 DIAGNOSIS — G4730 Sleep apnea, unspecified: Secondary | ICD-10-CM | POA: Diagnosis not present

## 2017-03-21 NOTE — Procedures (Signed)
Patient Name: [name], [name] Date: 03/10/2017 Gender: Male D.O.B: 07-05-1934 Age (years): 47 Referring Provider: Simonne Maffucci Height (inches): 70 Interpreting Physician: Kara Mead MD, ABSM Weight (lbs): 217 RPSGT: Madelon Lips BMI: 31 MRN: [id number] Neck Size: 17.00  CLINICAL INFORMATION The patient is referred for a CPAP titration to treat sleep apnea & to evaluate need for nocturnal oxygen. H/o left diaphragmatic paralysis & OSA maintained on CPAP   SLEEP STUDY TECHNIQUE As per the AASM Manual for the Scoring of Sleep and Associated Events v2.3 (April 2016) with a hypopnea requiring 4% desaturations.  The channels recorded and monitored were frontal, central and occipital EEG, electrooculogram (EOG), submentalis EMG (chin), nasal and oral airflow, thoracic and abdominal wall motion, anterior tibialis EMG, snore microphone, electrocardiogram, and pulse oximetry. Continuous positive airway pressure (CPAP) was initiated at the beginning of the study and titrated to treat sleep-disordered breathing.  TECHNICIAN COMMENTS Comments added by technician: Patient had more than two awakenings to use the bathroom. Patient could not tolerate CPAP. Patient had difficulty initiating sleep. O2 initiated due to low sats. BATHROOM BREAKS 10X'S. PATIENT DISPLAYED ANXIETY AND DIFFICULTY TOLERATING CPAP DURING TRIALS DUE TO PRESSURE BEING LOWER THAN UNIT AT HOME   RESPIRATORY PARAMETERS Optimal PAP Pressure (cm): 7 AHI at Optimal Pressure (/hr): 0.0 Overall Minimal O2 (%): 86.00 Supine % at Optimal Pressure (%): 0 Minimal O2 at Optimal Pressure (%): 86.0    SLEEP ARCHITECTURE The study was initiated at 10:46:59 PM and ended at 4:51:55 AM.  Sleep onset time was 74.3 minutes and the sleep efficiency was 8.4%. The total sleep time was 30.5 minutes.  The patient spent 31.15% of the night in stage N1 sleep, 68.85% in stage N2 sleep, 0.00% in stage N3 and 0.00% in REM.Stage REM latency was N/A  minutes  Wake after sleep onset was 260.2. Alpha intrusion was absent. Supine sleep was 0.00%.  CARDIAC DATA The 2 lead EKG demonstrated sinus rhythm. The mean heart rate was 70.16 beats per minute. Other EKG findings include: Atrial Fibrillation, PVCs.   LEG MOVEMENT DATA The total Periodic Limb Movements of Sleep (PLMS) were 0. The PLMS index was 0.00. A PLMS index of <15 is considered normal in adults.  IMPRESSIONS - Sub-optimal study -Only 30 mins of sleep time, but PAP pressure was 7 cm of water. - Central sleep apnea was not noted during this titration (CAI = 0.0/h). - Severe oxygen desaturations were observed during this titration (min O2 = 86.00%). 3L of oxygen was added - No snoring was audible during this study. - 2-lead EKG demonstrated: Atrial Fibrillation, PVCs - Clinically significant periodic limb movements were not noted during this study. Arousals associated with PLMs were rare.   DIAGNOSIS - Obstructive Sleep Apnea (327.23 [G47.33 ICD-10])   RECOMMENDATIONS - Trial of  autoCPAP  to find ideal pressure since this was a sub-optimal study. Alternatively review his download on current setting to ensure that they are adequate - 3L of oxygen can be blended into CPAP based on this study - Avoid alcohol, sedatives and other CNS depressants that may worsen sleep apnea and disrupt normal sleep architecture. - Sleep hygiene should be reviewed to assess factors that may improve sleep quality. - Weight management and regular exercise should be initiated or continued. - Return to [facility] Center for re-evaluation after 4 weeks of therapy    Kara Mead MD Board Certified in [place]

## 2017-03-24 ENCOUNTER — Telehealth: Payer: Self-pay | Admitting: Pulmonary Disease

## 2017-03-24 DIAGNOSIS — G4733 Obstructive sleep apnea (adult) (pediatric): Secondary | ICD-10-CM

## 2017-03-24 DIAGNOSIS — J432 Centrilobular emphysema: Secondary | ICD-10-CM

## 2017-03-24 DIAGNOSIS — R0602 Shortness of breath: Secondary | ICD-10-CM

## 2017-03-24 NOTE — Telephone Encounter (Signed)
There is another phone message in also for a separate issue.

## 2017-03-24 NOTE — Telephone Encounter (Signed)
Spoke with pt. He is requesting his CPAP titration study results.  BQ - please advise. Thanks.

## 2017-03-24 NOTE — Telephone Encounter (Signed)
Spoke with pt. He would like to have a prescription for a walker with a seat on it. Order will need to be sent to [facility].  BQ - please advise. Thanks.

## 2017-03-24 NOTE — Telephone Encounter (Signed)
There is another message in for this patient as well about a separate issue.

## 2017-03-26 NOTE — Telephone Encounter (Signed)
BQ - please advise. Thanks. 

## 2017-03-26 NOTE — Telephone Encounter (Signed)
Sent result note to [facility]

## 2017-03-27 NOTE — Telephone Encounter (Signed)
Notes recorded by Juanito Doom, MD on 03/26/2017 at 10:17 PM EDT [name], based on this result he needs to have CPAP 7 cm of water with 3 L of O2 bleed in. He also needs to have an auto titrating device prescribed 5-20 cm of water, 3 week download. Brent ---------------------------------- Attempted to contact pt. No answer, no option to leave a message. Will try back.

## 2017-03-28 NOTE — Telephone Encounter (Signed)
BQ - please advise. Thanks. 

## 2017-03-28 NOTE — Telephone Encounter (Signed)
Spoke with pt. He is aware of his results. Order has been placed. Nothing further was needed.

## 2017-03-31 NOTE — Telephone Encounter (Signed)
The order for the walker with a seat has been placed.  Nothing further is needed.

## 2017-03-31 NOTE — Telephone Encounter (Signed)
OK by me 

## 2017-04-09 ENCOUNTER — Telehealth: Payer: Self-pay | Admitting: Pulmonary Disease

## 2017-04-09 DIAGNOSIS — R2681 Unsteadiness on feet: Secondary | ICD-10-CM

## 2017-04-09 NOTE — Telephone Encounter (Signed)
Called and spoke to pt. Pt states he is needing an order for a rolling walking. Per pt's chart there was an order placed on 6.26.18 and confirmed by [facility] with [facility]. Called Melissa and she states the pt was wanting a rollator walking. New order placed, Melissa is aware. Called and informed pt, he verbalized understanding and denied any further questions or concerns at this time.

## 2017-05-05 ENCOUNTER — Encounter: Payer: Self-pay | Admitting: Acute Care

## 2017-05-05 ENCOUNTER — Telehealth: Payer: Self-pay | Admitting: Acute Care

## 2017-05-05 ENCOUNTER — Ambulatory Visit (INDEPENDENT_AMBULATORY_CARE_PROVIDER_SITE_OTHER): Payer: Medicare Other | Admitting: Acute Care

## 2017-05-05 DIAGNOSIS — G4733 Obstructive sleep apnea (adult) (pediatric): Secondary | ICD-10-CM | POA: Diagnosis not present

## 2017-05-05 DIAGNOSIS — Z23 Encounter for immunization: Secondary | ICD-10-CM

## 2017-05-05 NOTE — Patient Instructions (Signed)
It is nice to meet you today. Please bring your CPAP machine in so we can get a down Load. Continue on CPAP at bedtime. You appear to be benefiting from the treatment Goal is to wear for at least 4-6 hours each night for maximal clinical benefit. Continue to work on weight loss, as the link between excess weight  and sleep apnea is well established.  Do not drive if sleepy. Clean CPAP mask, tubing, reservoir and filter once weekly with soap and water. Please look into the [employer] Program for exercise options. Follow up with Dr. [name]  In 3 months or before as needed.  Please contact office for sooner follow up if symptoms do not improve or worsen or seek emergency care

## 2017-05-05 NOTE — Assessment & Plan Note (Addendum)
Patient states improved sleep, improved focus, and increased energy since using CPAP at 7 cm H2O with 3 L oxygen bled in.  Patient did not bring in machine, therefore no Simcard to retrieve a download Per patient he is compliant with his CPAP therapy each night Plan Please bring your CPAP machine in so we can get a down Load. Once we reviewed her downloaded we can evaluate if the current settings of your CPAP machine are adequate. We will call you with those results, and any suggestions for change one shoe provide [place] with a sim card for download. Continue on CPAP at bedtime. You appear to be benefiting from the treatment Goal is to wear for at least 4-6 hours each night for maximal clinical benefit. Continue to work on weight loss, as the link between excess weight  and sleep apnea is well established.  Do not drive if sleepy. Clean CPAP mask, tubing, reservoir and filter once weekly with soap and water. Please look into the [employer] Program for exercise options. Follow up with [name]  In 3 months or before as needed.  Please contact office for sooner follow up if symptoms do not improve or worsen or seek emergency care

## 2017-05-05 NOTE — Progress Notes (Signed)
History of Present Illness [name] is a 83 y.o. male former smoker ( Quit with 1986) with centrilobar emphysema and  OSA on CPAP. He is followed by Dr. [name] and Dr. Elsworth Soho.   05/05/2017 Pt. Presents for follow up after CPAP titration.He states he is doing well on his CPAP. He states he is wearing his device every night. He states he is better rested in the morning upon awakening. He is able to sleep for longer intervals at night and is noticing he is better focused through the day.He feels he has more energy. He cannot afford the Pulmonary rehab, so will try the  [facility] through the [facility]. We do not have a down Load, as the patient has an older machine and did not bring in the sim card. Patient denies fever, chest pain, orthopnea, or hemoptysis. No recent airline or automobile travel.  Test Results:  CPAP Titration Study 03/10/2017 RECOMMENDATIONS - Trial of autoCPAP to find ideal pressure since this was a  sub-optimal study. Alternatively review his download on current  setting to ensure that they are adequate - 3L of oxygen can be blended into CPAP based on this study - Avoid alcohol, sedatives and other CNS depressants that may  worsen sleep apnea and disrupt normal sleep architecture. - Sleep hygiene should be reviewed to assess factors that may  improve sleep quality. - Weight management and regular exercise should be initiated or  continued. - Return to [facility] for re-evaluation after 4 weeks of  therapy  CBC Latest Ref Rng & Units 10/14/2011 04/06/2011 04/05/2011  WBC 4.5 - 10.5 K/uL 5.6 9.1 10.7(H)  Hemoglobin 13.0 - 17.0 g/dL 14.6 13.6 13.5  Hematocrit 39.0 - 52.0 % 42.8 39.8 39.8  Platelets 150.0 - 400.0 K/uL 142.0(L) 206 219    BMP Latest Ref Rng & Units 11/04/2011 10/14/2011 08/26/2011  Glucose 70 - 99 mg/dL 93 97 100(H)  BUN 6 - 23 mg/dL 30(H) 27(H) 29(H)  Creatinine 0.4 - 1.5 mg/dL 0.9 0.8 0.8  Sodium 135 - 145 mEq/L 140 139 143  Potassium 3.5  - 5.1 mEq/L 4.1 4.2 4.0  Chloride 96 - 112 mEq/L 105 106 107  CO2 19 - 32 mEq/L 29 28 29   Calcium 8.4 - 10.5 mg/dL 9.1 8.9 8.9    BNP No results found for: BNP  ProBNP    Component Value Date/Time   PROBNP 7150.0 (H) 04/03/2011 0127    PFT    Component Value Date/Time   FEV1PRE 0.92 08/25/2014 1056   FEV1POST 1.04 08/25/2014 1056   FVCPRE 1.68 08/25/2014 1056   FVCPOST 1.72 08/25/2014 1056   TLC 4.51 08/25/2014 1056   DLCOUNC 5.24 08/25/2014 1056   PREFEV1FVCRT 55 08/25/2014 1056   PSTFEV1FVCRT 61 08/25/2014 1056    No results found.   Past medical hx Past Medical History:  Diagnosis Date  . BPH (benign prostatic hyperplasia)   . Coronary artery ectasia    Mild CAD with normal systolic function per cath in August of 2011  . Diaphragmatic paralysis    felt to be partially responsible for dyspnea  . Dyspnea    due to paralyzed diaphragm and pulmonary issues  . Gout   . HTN (hypertension)   . Hypercholesteremia   . Microhematuria   . Obesity   . OSA (obstructive sleep apnea)   . Paroxysmal atrial fibrillation (HCC)    occured in the setting of acute E Coli sepsis and ileius 7/12  . PVC (premature  ventricular contraction)    s/p PVC ablation 05/29/2010     Social History  Substance Use Topics  . Smoking status: Former Smoker    Packs/day: 3.00    Years: 20.00    Types: Cigarettes    Quit date: 09/09/1984  . Smokeless tobacco: Never Used  . Alcohol use 0.0 oz/week     Comment: 1 glass of wine per week    [name] reports that he quit smoking about 32 years ago. His smoking use included Cigarettes. He has a 60.00 pack-year smoking history. He has never used smokeless tobacco. He reports that he drinks alcohol. He reports that he does not use drugs.  Tobacco Cessation: Counseling given: Not Answered   Past surgical hx, Family hx, Social hx all reviewed.  Current Outpatient Prescriptions on File Prior to Visit  Medication Sig  . allopurinol (ZYLOPRIM)  300 MG tablet Take 300 mg by mouth daily.    Marland Kitchen amLODipine (NORVASC) 5 MG tablet Take 5 mg by mouth daily.  Marland Kitchen aspirin 81 MG tablet Take 81 mg by mouth daily.    Marland Kitchen buPROPion (WELLBUTRIN SR) 150 MG 12 hr tablet Take 100 mg by mouth 2 (two) times daily.   Marland Kitchen dicyclomine (BENTYL) 20 MG tablet Take 20 mg by mouth 2 (two) times daily.   Marland Kitchen doxazosin (CARDURA) 4 MG tablet Take 1 tablet (4 mg total) by mouth daily.  Marland Kitchen ibuprofen (ADVIL,MOTRIN) 200 MG tablet Take 200-400 mg by mouth every 6 (six) hours as needed (body aches). For pain  . lisinopril (PRINIVIL,ZESTRIL) 40 MG tablet Take 40 mg by mouth daily.   Marland Kitchen lovastatin (MEVACOR) 40 MG tablet Take 40 mg by mouth at bedtime.    . metoprolol succinate (TOPROL-XL) 25 MG 24 hr tablet Take 25 mg by mouth 2 (two) times daily.   . sertraline (ZOLOFT) 100 MG tablet Take 100 mg by mouth daily.  . traZODone (DESYREL) 100 MG tablet Take 100 mg by mouth at bedtime.     No current facility-administered medications on file prior to visit.      No Known Allergies  Review Of Systems:  Constitutional:   No  weight loss, night sweats,  Fevers, chills, fatigue, or  lassitude.  HEENT:   No headaches,  Difficulty swallowing,  Tooth/dental problems, or  Sore throat,                No sneezing, itching, ear ache, nasal congestion, post nasal drip,   CV:  No chest pain,  Orthopnea, PND, swelling in lower extremities, anasarca, dizziness, palpitations, syncope.   GI  No heartburn, indigestion, abdominal pain, nausea, vomiting, diarrhea, change in bowel habits, loss of appetite, bloody stools.   Resp: No shortness of breath with exertion or at rest.  No excess mucus, no productive cough,  No non-productive cough,  No coughing up of blood.  No change in color of mucus.  No wheezing.  No chest wall deformity  Skin: no rash or lesions.  GU: no dysuria, change in color of urine, no urgency or frequency.  No flank pain, no hematuria   MS:  No joint pain or swelling.  No  decreased range of motion.  No back pain.  Psych:  No change in mood or affect. No depression or anxiety.  No memory loss.   Vital Signs BP 134/62 (BP Location: Right Arm, Cuff Size: Normal)   Pulse 72   Ht 5\' 10"  (1.778 m)   Wt 215 lb 9.6 oz (97.8 kg)  SpO2 92%   BMI 30.94 kg/m    Physical Exam:  General- No distress,  A&Ox3 ENT: No sinus tenderness, TM clear, pale nasal mucosa, no oral exudate,no post nasal drip, no LAN Cardiac: S1, S2, regular rate and rhythm, no murmur Chest: No wheeze/ rales/ dullness; no accessory muscle use, no nasal flaring, no sternal retractions Abd.: Soft Non-tender Ext: No clubbing cyanosis, edema Neuro:  normal strength Skin: No rashes, warm and dry Psych: normal mood and behavior   Assessment/Plan  Obstructive sleep apnea Patient states improved sleep, improved focus, and increased energy since using CPAP at 7 cm H2O with 3 L oxygen bled in.  Patient did not bring in machine, therefore no Simcard to retrieve a download Per patient he is compliant with his CPAP therapy each night Plan Please bring your CPAP machine in so we can get a down Load. Once we reviewed her downloaded we can evaluate if the current settings of your CPAP machine are adequate. We will call you with those results, and any suggestions for change one shoe provide [place] with a sim card for download. Continue on CPAP at bedtime. You appear to be benefiting from the treatment Goal is to wear for at least 4-6 hours each night for maximal clinical benefit. Continue to work on weight loss, as the link between excess weight  and sleep apnea is well established.  Do not drive if sleepy. Clean CPAP mask, tubing, reservoir and filter once weekly with soap and water. Please look into the [employer] Program for exercise options. Follow up with Dr. [name]  In 3 months or before as needed.  Please contact office for sooner follow up if symptoms do not improve or worsen or seek  emergency care      Magdalen Spatz, NP 05/05/2017  4:55 PM

## 2017-05-05 NOTE — Progress Notes (Signed)
Reviewed, agree 

## 2017-05-05 NOTE — Telephone Encounter (Signed)
Jonelle Sidle, please call Mr. Bohlman and make sure he brings his Symbicort card so that we can retrieve a download from his CPAP device. We told him  to do this in the office today, however we need to make sure he does this therefore follow-up will be crucial. Thanks so much.

## 2017-05-06 NOTE — Telephone Encounter (Signed)
Spoke with pt and he will make sure to bring his SIM card from his machine. I advised him to bring the machine if he could not retrieve the card. Pt understood and agreed. Appt with BQ on 07/23/2017.

## 2017-06-16 ENCOUNTER — Ambulatory Visit: Payer: Medicare Other | Admitting: Nurse Practitioner

## 2017-07-08 ENCOUNTER — Ambulatory Visit (INDEPENDENT_AMBULATORY_CARE_PROVIDER_SITE_OTHER): Payer: Medicare Other | Admitting: Nurse Practitioner

## 2017-07-08 ENCOUNTER — Encounter: Payer: Self-pay | Admitting: Nurse Practitioner

## 2017-07-08 VITALS — BP 170/80 | HR 57 | Ht 70.0 in | Wt 213.8 lb

## 2017-07-08 DIAGNOSIS — R0602 Shortness of breath: Secondary | ICD-10-CM

## 2017-07-08 DIAGNOSIS — I259 Chronic ischemic heart disease, unspecified: Secondary | ICD-10-CM | POA: Diagnosis not present

## 2017-07-08 DIAGNOSIS — I10 Essential (primary) hypertension: Secondary | ICD-10-CM | POA: Diagnosis not present

## 2017-07-08 DIAGNOSIS — I493 Ventricular premature depolarization: Secondary | ICD-10-CM | POA: Diagnosis not present

## 2017-07-08 LAB — BASIC METABOLIC PANEL
BUN/Creatinine Ratio: 26 — ABNORMAL HIGH (ref 10–24)
BUN: 23 mg/dL (ref 8–27)
CO2: 27 mmol/L (ref 20–29)
Calcium: 9.5 mg/dL (ref 8.6–10.2)
Chloride: 100 mmol/L (ref 96–106)
Creatinine, Ser: 0.87 mg/dL (ref 0.76–1.27)
GFR calc Af Amer: 94 mL/min/{1.73_m2} (ref >59)
GFR calc non Af Amer: 81 mL/min/{1.73_m2} (ref >59)
Glucose: 96 mg/dL (ref 65–99)
Potassium: 4.6 mmol/L (ref 3.5–5.2)
Sodium: 142 mmol/L (ref 134–144)

## 2017-07-08 MED ORDER — SPIRONOLACTONE 25 MG PO TABS: 25.0000 mg | ORAL_TABLET | Freq: Every day | ORAL | 6 refills | Status: DC

## 2017-07-08 NOTE — Patient Instructions (Addendum)
We will be checking the following labs today - BMET  BMET in a week   Medication Instructions:    Continue with your current medicines. BUT  I am adding Aldactone 25 mg to take once a day - in the AM  Try to limit your use of Ibuprofen/Advil/Aleve/Naproxen    Testing/Procedures To Be Arranged:  N/A  Follow-Up:   See me in about a month    Other Special Instructions:   Keep diary of your BP over the next month - bring back for me to review    If you need a refill on your cardiac medications before your next appointment, please call your pharmacy.   Call the [facility] office at [phone] if you have any questions, problems or concerns.

## 2017-07-08 NOTE — Progress Notes (Signed)
CARDIOLOGY OFFICE NOTE  Date:  07/08/2017    [name] Date of Birth: 06/03/1934 Medical Record #[id number]  PCP:  [name], MD  Cardiologist:  [name]  Chief Complaint  Patient presents with  . Atrial Fibrillation  . Hyperlipidemia  . Shortness of Breath    6 month check - seen for Dr. [name]    History of Present Illness: [name] is a 82 y.o. male who presents today for a 6 month check. Seen for Dr. [name]. He has seen Dr. [name] in the remote past.   He has a h/o PVC ablation 05/29/10, PAF, obesity, sedentary lifestyle, HLD, gout, OSA treated with CPAP, &dysfunctional diaphragm with chronic dyspnea previously followed by Dr. [name]. He has mild CAD per cath back in August of 2011. Labs are checked by primary care.   Seen back in February of 2016 by Dr. [name] - this was for evaluation of dyspnea. Dr. [name] wanted cardiac status reevaluated to assess for any cardiac issues that may be contributing to dyspnea. Was referred for Myoview and echocardiogram. These studies were satisfactory. Encouraged to cut back on his alcohol intake as well.   I have followed him since - he has been holding his own - dyspnea is chronic. He has gotten back with pulmonary - now seeing Dr. [name]. Balance issues and has had some falls. Rhythm has been stable fortunately. Last seen back in April.   Comes back today. Here alone.He says he is "holding his own". Has been trying to lose weight. Lost about 9 pounds since last visit with me. He tells me he has had labs by Dr. Philip Aspen and will be seeing him again in January. He is back seeing pulmonary - Dr. Pennie Banter - back on some albuterol. Chronic shortness of breath. No chest pain. Bp is high here today again - not really checking at home. Using chronic NSAID as well. Some swelling as the day progresses.   Past Medical History:  Diagnosis Date  . BPH (benign prostatic hyperplasia)   . Coronary artery ectasia      Mild CAD with normal systolic function per cath in August of 2011  . Diaphragmatic paralysis    felt to be partially responsible for dyspnea  . Dyspnea    due to paralyzed diaphragm and pulmonary issues  . Gout   . HTN (hypertension)   . Hypercholesteremia   . Microhematuria   . Obesity   . OSA (obstructive sleep apnea)   . Paroxysmal atrial fibrillation (HCC)    occured in the setting of acute E Coli sepsis and ileius 7/12  . PVC (premature ventricular contraction)    s/p PVC ablation 05/29/2010    Past Surgical History:  Procedure Laterality Date  . APPENDECTOMY  1978  . CARDIAC CATHETERIZATION  04-30-2010   Left main coronary artery is normal.   . CARDIOVASCULAR STRESS TEST  03-19-2010   0%  . CHOLECYSTECTOMY  1990  . poly removed from nose as a child    . PVC ablation  05/29/2010  . US ECHOCARDIOGRAPHY  03-09-2010   EF 55-60%     Medications: Current Meds  Medication Sig  . albuterol (PROVENTIL) (5 MG/ML) 0.5% nebulizer solution Take 2.5 mg by nebulization every 6 (six) hours as needed for wheezing or shortness of breath.  . allopurinol (ZYLOPRIM) 300 MG tablet Take 300 mg by mouth daily.    Marland Kitchen amLODipine (NORVASC) 5 MG tablet Take 5 mg by mouth daily.  Marland Kitchen  aspirin 81 MG tablet Take 81 mg by mouth daily.    Marland Kitchen buPROPion (WELLBUTRIN SR) 100 MG 12 hr tablet Take 100 mg by mouth 2 (two) times daily.  Marland Kitchen dicyclomine (BENTYL) 20 MG tablet Take 20 mg by mouth 2 (two) times daily.   Marland Kitchen doxazosin (CARDURA) 4 MG tablet Take 1 tablet (4 mg total) by mouth daily.  Marland Kitchen ibuprofen (ADVIL,MOTRIN) 200 MG tablet Take 200-400 mg by mouth every 6 (six) hours as needed (body aches). For pain  . lisinopril (PRINIVIL,ZESTRIL) 40 MG tablet Take 40 mg by mouth daily.   Marland Kitchen lovastatin (MEVACOR) 40 MG tablet Take 40 mg by mouth at bedtime.    . metoprolol succinate (TOPROL-XL) 25 MG 24 hr tablet Take 25 mg by mouth 2 (two) times daily.   . sertraline (ZOLOFT) 100 MG tablet Take 100 mg by mouth daily.  .  traZODone (DESYREL) 100 MG tablet Take 100 mg by mouth at bedtime.    . [DISCONTINUED] buPROPion (WELLBUTRIN SR) 150 MG 12 hr tablet Take 100 mg by mouth 2 (two) times daily.      Allergies: No Known Allergies  Social History: The patient  reports that he quit smoking about 32 years ago. His smoking use included Cigarettes. He has a 60.00 pack-year smoking history. He has never used smokeless tobacco. He reports that he drinks alcohol. He reports that he does not use drugs.   Family History: The patient's family history includes Aneurysm (age of onset: 40) in his father; Breast cancer in his daughter and mother.   Review of Systems: Please see the history of present illness.   Otherwise, the review of systems is positive for none.   All other systems are reviewed and negative.   Physical Exam: VS:  BP (!) 170/80   Pulse (!) 57   Ht 5\' 10"  (1.778 m)   Wt 213 lb 12.8 oz (97 kg)   SpO2 92%   BMI 30.68 kg/m  .  BMI Body mass index is 30.68 kg/m.  Wt Readings from Last 3 Encounters:  07/08/17 213 lb 12.8 oz (97 kg)  05/05/17 215 lb 9.6 oz (97.8 kg)  03/10/17 217 lb (98.4 kg)    General: Pleasant. Well developed, well nourished and in no acute distress.  He has lost 9 pounds since last visit with me.  HEENT: Normal.  Neck: Supple, no JVD, carotid bruits, or masses noted.  Cardiac: Regular rate and rhythm. No murmurs, rubs, or gallops. No edema.  Respiratory:  Lungs are clear to auscultation bilaterally with normal work of breathing.  GI: Soft and nontender.  MS: No deformity or atrophy. Gait and ROM intact.  Skin: Warm and dry. Color is normal.  Neuro:  Strength and sensation are intact and no gross focal deficits noted.  Psych: Alert, appropriate and with normal affect.   LABORATORY DATA:  EKG:  EKG is not ordered today.  Lab Results  Component Value Date   WBC 5.6 10/14/2011   HGB 14.6 10/14/2011   HCT 42.8 10/14/2011   PLT 142.0 (L) 10/14/2011   GLUCOSE 93  11/04/2011   ALT 28 04/05/2011   AST 32 04/05/2011   NA 140 11/04/2011   K 4.1 11/04/2011   CL 105 11/04/2011   CREATININE 0.9 11/04/2011   BUN 30 (H) 11/04/2011   CO2 29 11/04/2011   TSH 1.66 10/14/2011   INR 1.26 04/07/2011       BNP (last 3 results) No results for input(s): BNP in the  last 8760 hours.  ProBNP (last 3 results) No results for input(s): PROBNP in the last 8760 hours.   Other Studies Reviewed Today:  Myoview Impression from March 2016 Exercise Capacity: [place] with no exercise. BP Response: Normal blood pressure response. Clinical Symptoms: Nausea. ECG Impression: Atrial fibrillation, no changes from baseline.  Comparison with Prior Nuclear Study: No images to compare  Overall Impression: Low risk stress nuclear study a medium-sized basal inferior and basal to mid inferolateral perfusion defect that is actually worse at rest than with stress. No ischemia. Given normal wall motion, this may represent diaphragmatic attenuation. .  LV Ejection Fraction: 63%. LV Wall Motion: NL LV Function; NL Wall Motion   Loralie Champagne 11/07/2014   Echo Study Conclusions from February 2016  - Left ventricle: The cavity size was normal. Wall thickness was increased in a pattern of mild LVH. Systolic function was normal. The estimated ejection fraction was in the range of 60% to 65%. Wall motion was normal; there were no regional wall motion abnormalities. - Aortic valve: There was trivial regurgitation. - Left atrium: The atrium was mildly dilated. - Right atrium: The atrium was mildly dilated.  Assessment/Plan:  1. Dyspnea - seems stable. His cardiac studies from 2016 were stable. Back now seeing pulmonary - he understands he needs to keep working on endurance - he has lost weight.   2. PVCs - past ablation - no problems reported. Regular rhythm on exam today.    3. HTN - recheck by me is 170/80. Adding Aldactone 25 mg today. BMET  today. BMET in one week. Needs to limit his use of NSAID.   4. Obesity - he has lost weight - encouraged him to continue. This is good.   5. Depression - long standing issue - not discussed today.   6. HLD - on statin - labs checked by PCP  7. CAD - managed medically - no active symptoms.   Current medicines are reviewed with the patient today.  The patient does not have concerns regarding medicines other than what has been noted above.  The following changes have been made:  See above.  Labs/ tests ordered today include:    Orders Placed This Encounter  Procedures  . Basic metabolic panel  . Basic metabolic panel     Disposition:   FU with me in 1 months.   Patient is agreeable to this plan and will call if any problems develop in the interim.   SignedTruitt Merle, NP  07/08/2017 8:30 AM  [facility] [address] [place], [place]  [address] Phone: [phone] Fax: [phone]

## 2017-07-16 ENCOUNTER — Other Ambulatory Visit: Payer: Medicare Other

## 2017-07-22 ENCOUNTER — Other Ambulatory Visit: Payer: Medicare Other

## 2017-07-22 DIAGNOSIS — R0602 Shortness of breath: Secondary | ICD-10-CM

## 2017-07-22 DIAGNOSIS — I10 Essential (primary) hypertension: Secondary | ICD-10-CM

## 2017-07-22 LAB — BASIC METABOLIC PANEL
BUN/Creatinine Ratio: 25 — ABNORMAL HIGH (ref 10–24)
BUN: 25 mg/dL (ref 8–27)
CO2: 25 mmol/L (ref 20–29)
Calcium: 9.2 mg/dL (ref 8.6–10.2)
Chloride: 101 mmol/L (ref 96–106)
Creatinine, Ser: 1.01 mg/dL (ref 0.76–1.27)
GFR calc Af Amer: 80 mL/min/{1.73_m2} (ref >59)
GFR calc non Af Amer: 69 mL/min/{1.73_m2} (ref >59)
Glucose: 88 mg/dL (ref 65–99)
Potassium: 4.7 mmol/L (ref 3.5–5.2)
Sodium: 142 mmol/L (ref 134–144)

## 2017-07-23 ENCOUNTER — Ambulatory Visit (INDEPENDENT_AMBULATORY_CARE_PROVIDER_SITE_OTHER)
Admission: RE | Admit: 2017-07-23 | Discharge: 2017-07-23 | Disposition: A | Discharge status: Home / Self Care | Payer: Medicare Other | Source: Ambulatory Visit | Attending: Pulmonary Disease | Admitting: Pulmonary Disease

## 2017-07-23 ENCOUNTER — Ambulatory Visit (INDEPENDENT_AMBULATORY_CARE_PROVIDER_SITE_OTHER): Payer: Medicare Other | Admitting: Pulmonary Disease

## 2017-07-23 VITALS — BP 136/84 | HR 57 | Ht 70.0 in | Wt 212.0 lb

## 2017-07-23 DIAGNOSIS — R0602 Shortness of breath: Secondary | ICD-10-CM | POA: Diagnosis not present

## 2017-07-23 DIAGNOSIS — R0600 Dyspnea, unspecified: Secondary | ICD-10-CM

## 2017-07-23 DIAGNOSIS — J432 Centrilobular emphysema: Secondary | ICD-10-CM | POA: Diagnosis not present

## 2017-07-23 DIAGNOSIS — G4733 Obstructive sleep apnea (adult) (pediatric): Secondary | ICD-10-CM | POA: Diagnosis not present

## 2017-07-23 MED ORDER — UMECLIDINIUM-VILANTEROL 62.5-25 MCG/INH IN AEPB: 1.0000 | INHALATION_SPRAY | Freq: Every day | RESPIRATORY_TRACT | 0 refills | Status: DC

## 2017-07-23 NOTE — Patient Instructions (Signed)
Chronic respiratory failure with hypoxemia: We will check your oxygen level while you are walking today  Shortness of breath: This has many causes, in your case COPD, paralyzed diaphragm, and muscular deconditioning are all contributing We will refer you to pulmonary rehab We will check a chest x-ray  COPD: Take Anoro 1 puff daily, no matter how you feel We will check spirometry testing today  OSA: Keep using CPAP nightly  We will see you back in 1-2 weeks with a nurse practitioner to see how you are doing

## 2017-07-23 NOTE — Progress Notes (Signed)
Subjective:    Patient ID: [name], male    DOB: 1933/10/31, 83 y.o.   MRN: [id number]  Synopsis: Former patient of Dr. [name] who is referred in 2015 for shortness of breath. Found to have evidence of diaphragm weakness as well as COPD. Also treated for obstructive sleep apnea.   HPI Chief Complaint  Patient presents with  . Follow-up    pt states he is doing well, does note stable DOE.      Lately Luca has been about the same. No recent bronchitis.   He feels that his dyspnea hasn't really worsened, about the same as the last viist. He is not limited by breathing (he says, his wife disagrees.  His wife says taht with minimal exertion (walking to the car) he gets short of breath.   His wife says that he sits all day, literally does nothing unless he drives.   He is interested in pulmonary rehab, though after I referred him in June he did not go. No recent bronchitis or pneumonia. He still uses CPAP every night, he says that it helps him breathe better in the evenings.   Past Medical History:  Diagnosis Date  . BPH (benign prostatic hyperplasia)   . Coronary artery ectasia    Mild CAD with normal systolic function per cath in August of 2011  . Diaphragmatic paralysis    felt to be partially responsible for dyspnea  . Dyspnea    due to paralyzed diaphragm and pulmonary issues  . Gout   . HTN (hypertension)   . Hypercholesteremia   . Microhematuria   . Obesity   . OSA (obstructive sleep apnea)   . Paroxysmal atrial fibrillation (HCC)    occured in the setting of acute E Coli sepsis and ileius 7/12  . PVC (premature ventricular contraction)    s/p PVC ablation 05/29/2010      Review of Systems  Constitutional: Negative for chills, diaphoresis and fatigue.  HENT: Negative for rhinorrhea, sinus pressure and sneezing.   Respiratory: Positive for shortness of breath. Negative for cough and wheezing.   Cardiovascular: Negative for chest pain, palpitations and leg  swelling.       Objective:   Physical Exam Vitals:   07/23/17 1538  BP: 136/84  Pulse: (!) 57  SpO2: 92%  Weight: 212 lb (96.2 kg)  Height: 5\' 10"  (1.778 m)   RA  Gen: chronically ill appearing HENT: OP clear, TM's clear, neck supple PULM: CTA B, normal percussion CV: RRR, no mgr, trace edema GI: BS+, soft, nontender Derm: no cyanosis or rash Psyche: normal mood and affect   PFT PFT's 08/2014:  FEV1 0.92 (31%), ratio 55, decrease in muscle pressures, mild restriction, could not do DLCO maneuver.  CXR, CT abdomen, and diaphragm fluoroscopy images personally reviewed today in clinic, mild left hemidiaphragm elevation noted.     Assessment & Plan:   Centrilobular emphysema ([place]) - Plan: Spirometry with Graph, AMB referral to pulmonary rehabilitation  Dyspnea, unspecified type - Plan: DG Chest 2 View  Obstructive sleep apnea  Shortness of breath  Discussion: Michial insists that he has minimal symptoms and that he is doing "great".  However, his wife reports the exact opposite.  She says that he is short of breath with nearly any activity and he is quite sedentary.  After the last visit he told me that he wanted to go to pulmonary rehab but he never signed up for it.  So it sounds as if he  is quite short of breath.  Clearly he is limited by his severe COPD, diaphragm paralysis and muscular deconditioning.  He is also obese.  We really need to get him exercising.  Because of the dyspnea she is reporting I am going to have him start taking Anoro.  We will also get a chest x-ray today to see if there is anything else going on and we will check a spirometry test to compare it to the last study that was done in 2015.  Plan: Chronic respiratory failure with hypoxemia: We will check your oxygen level while you are walking today  Shortness of breath: This has many causes, in your case COPD, paralyzed diaphragm, and muscular deconditioning are all contributing We will refer  you to pulmonary rehab We will check a chest x-ray  COPD: Take Anoro 1 puff daily, no matter how you feel We will check spirometry testing today  OSA: Keep using CPAP nightly  We will see you back in 1-2 weeks with a nurse practitioner to see how you are doing   Current Outpatient Medications:  .  albuterol (PROVENTIL) (5 MG/ML) 0.5% nebulizer solution, Take 2.5 mg by nebulization every 6 (six) hours as needed for wheezing or shortness of breath., Disp: , Rfl:  .  allopurinol (ZYLOPRIM) 300 MG tablet, Take 300 mg by mouth daily.  , Disp: , Rfl:  .  amLODipine (NORVASC) 5 MG tablet, Take 5 mg by mouth daily., Disp: , Rfl: 6 .  aspirin 81 MG tablet, Take 81 mg by mouth daily.  , Disp: , Rfl:  .  buPROPion (WELLBUTRIN SR) 100 MG 12 hr tablet, Take 100 mg by mouth 2 (two) times daily., Disp: , Rfl:  .  dicyclomine (BENTYL) 20 MG tablet, Take 20 mg by mouth 2 (two) times daily. , Disp: , Rfl:  .  doxazosin (CARDURA) 4 MG tablet, Take 1 tablet (4 mg total) by mouth daily., Disp: 30 tablet, Rfl: 3 .  ibuprofen (ADVIL,MOTRIN) 200 MG tablet, Take 200-400 mg by mouth every 6 (six) hours as needed (body aches). For pain, Disp: , Rfl:  .  lisinopril (PRINIVIL,ZESTRIL) 40 MG tablet, Take 40 mg by mouth daily. , Disp: , Rfl:  .  lovastatin (MEVACOR) 40 MG tablet, Take 40 mg by mouth at bedtime.  , Disp: , Rfl:  .  metoprolol succinate (TOPROL-XL) 25 MG 24 hr tablet, Take 25 mg by mouth 2 (two) times daily. , Disp: , Rfl:  .  sertraline (ZOLOFT) 100 MG tablet, Take 100 mg by mouth daily., Disp: , Rfl: 5 .  spironolactone (ALDACTONE) 25 MG tablet, Take 1 tablet (25 mg total) by mouth daily., Disp: 30 tablet, Rfl: 6 .  traZODone (DESYREL) 100 MG tablet, Take 100 mg by mouth at bedtime.  , Disp: , Rfl:

## 2017-07-24 ENCOUNTER — Telehealth (HOSPITAL_COMMUNITY): Payer: Self-pay

## 2017-07-24 ENCOUNTER — Telehealth: Payer: Self-pay | Admitting: Acute Care

## 2017-07-24 NOTE — Telephone Encounter (Signed)
Notes recorded by Juanito Doom, MD on 07/24/2017 at 8:30 AM EST A, Please let the patient know this was OK Thanks, B   Pt is aware of results and voiced his understanding.  Nothing further needed.

## 2017-07-24 NOTE — Telephone Encounter (Signed)
Patients insurance is active and benefits verified through [facility] - $20 co-pay, no deductible, out of pocket amount of $4,400/$312.40 has been met, no co-insurance, and no pre-authorization is required. Reference [id number]  Patient will be contacted for scheduling.

## 2017-07-24 NOTE — Telephone Encounter (Signed)
Attempted to call patient in regards to Pulmonary Rehab. LMTCB °

## 2017-08-04 ENCOUNTER — Telehealth (HOSPITAL_COMMUNITY): Payer: Self-pay

## 2017-08-04 NOTE — Telephone Encounter (Signed)
2nd attempt to call patient in regards to Pulmonary Rehab - Lm on Vm. Sending letter. °

## 2017-08-05 ENCOUNTER — Ambulatory Visit (INDEPENDENT_AMBULATORY_CARE_PROVIDER_SITE_OTHER): Payer: Medicare Other | Admitting: Nurse Practitioner

## 2017-08-05 ENCOUNTER — Encounter: Payer: Self-pay | Admitting: Nurse Practitioner

## 2017-08-05 VITALS — BP 150/80 | HR 64 | Ht 70.0 in | Wt 212.8 lb

## 2017-08-05 DIAGNOSIS — I10 Essential (primary) hypertension: Secondary | ICD-10-CM

## 2017-08-05 LAB — BASIC METABOLIC PANEL
BUN/Creatinine Ratio: 38 — ABNORMAL HIGH (ref 10–24)
BUN: 35 mg/dL — ABNORMAL HIGH (ref 8–27)
CO2: 24 mmol/L (ref 20–29)
Calcium: 9.2 mg/dL (ref 8.6–10.2)
Chloride: 103 mmol/L (ref 96–106)
Creatinine, Ser: 0.92 mg/dL (ref 0.76–1.27)
GFR calc Af Amer: 90 mL/min/{1.73_m2} (ref >59)
GFR calc non Af Amer: 78 mL/min/{1.73_m2} (ref >59)
Glucose: 103 mg/dL — ABNORMAL HIGH (ref 65–99)
Potassium: 4.3 mmol/L (ref 3.5–5.2)
Sodium: 141 mmol/L (ref 134–144)

## 2017-08-05 NOTE — Patient Instructions (Addendum)
We will be checking the following labs today - BMET   Medication Instructions:    Continue with your current medicines.     Testing/Procedures To Be Arranged:  N/A  Follow-Up:   See me in 3 months    Other Special Instructions:   N/A    If you need a refill on your cardiac medications before your next appointment, please call your pharmacy.   Call the [place] office at [phone] if you have any questions, problems or concerns.

## 2017-08-05 NOTE — Progress Notes (Signed)
CARDIOLOGY OFFICE NOTE  Date:  08/05/2017    [name] Date of Birth: [birth year] Medical Record #[id number]  PCP:  [name], MD  Cardiologist:  [name]  Chief Complaint  Patient presents with  . Hypertension  . Shortness of Breath    One month check - seen for Dr. [name]    History of Present Illness: [name] is a 83 y.o. male who presents today for a one month check. Seen for Dr. [name]. He has seen Dr. [name] in the remote past.   He has a h/o PVC ablation 05/29/10, PAF, obesity, sedentary lifestyle, HLD, gout, OSA treated with CPAP, &dysfunctional diaphragm with chronic dyspnea previously followed by Dr. [name]. He has mild CAD per cath back in August of 2011. Labs are checked by primary care.   Seen back in February of 2016 by Dr. [name] - this was for evaluation of dyspnea. Dr. [name] wanted cardiac status reevaluated to assess for any cardiac issues that may be contributing to dyspnea. Was referred for Myoview and echocardiogram. These studies were satisfactory. Encouraged to cut back on his alcohol intake as well.   I have followed him since - he has been holding his own - dyspnea is chronic. He has gotten back with pulmonary - now seeing Dr. [name]. Balance issues and has had some falls. Rhythm has been stable fortunately.   I saw him last month - BP was pretty high. He was actively losing weight. Still short of breath - this is chronic. Aldactone was added to his regimen.   Comes back today. Here alone.He is doing well. No chest pain. Breathing is about the same. He actually lost another pound with the recent holiday. BP list from home shows his readings trending down. He feels good on his current regimen. Balance remains an issue. Overall, he is happy with how he is doing.   Past Medical History:  Diagnosis Date  . BPH (benign prostatic hyperplasia)   . Coronary artery ectasia    Mild CAD with normal systolic function per  cath in August of 2011  . Diaphragmatic paralysis    felt to be partially responsible for dyspnea  . Dyspnea    due to paralyzed diaphragm and pulmonary issues  . Gout   . HTN (hypertension)   . Hypercholesteremia   . Microhematuria   . Obesity   . OSA (obstructive sleep apnea)   . Paroxysmal atrial fibrillation (HCC)    occured in the setting of acute E Coli sepsis and ileius 7/12  . PVC (premature ventricular contraction)    s/p PVC ablation 05/29/2010    Past Surgical History:  Procedure Laterality Date  . APPENDECTOMY  1978  . CARDIAC CATHETERIZATION  04-30-2010   Left main coronary artery is normal.   . CARDIOVASCULAR STRESS TEST  03-19-2010   0%  . CHOLECYSTECTOMY  1990  . poly removed from nose as a child    . PVC ablation  05/29/2010  . US ECHOCARDIOGRAPHY  03-09-2010   EF 55-60%     Medications: Current Meds  Medication Sig  . albuterol (PROVENTIL) (5 MG/ML) 0.5% nebulizer solution Take 2.5 mg by nebulization every 6 (six) hours as needed for wheezing or shortness of breath.  . allopurinol (ZYLOPRIM) 300 MG tablet Take 300 mg by mouth daily.    Marland Kitchen amLODipine (NORVASC) 5 MG tablet Take 5 mg by mouth daily.  Marland Kitchen aspirin 81 MG tablet Take 81 mg by mouth daily.    Marland Kitchen  buPROPion (WELLBUTRIN SR) 100 MG 12 hr tablet Take 100 mg by mouth 2 (two) times daily.  Marland Kitchen dicyclomine (BENTYL) 20 MG tablet Take 20 mg by mouth 2 (two) times daily.   Marland Kitchen doxazosin (CARDURA) 4 MG tablet Take 1 tablet (4 mg total) by mouth daily.  Marland Kitchen ibuprofen (ADVIL,MOTRIN) 200 MG tablet Take 200-400 mg by mouth every 6 (six) hours as needed (body aches). For pain  . lisinopril (PRINIVIL,ZESTRIL) 40 MG tablet Take 40 mg by mouth daily.   Marland Kitchen lovastatin (MEVACOR) 40 MG tablet Take 40 mg by mouth at bedtime.    . metoprolol succinate (TOPROL-XL) 25 MG 24 hr tablet Take 25 mg by mouth 2 (two) times daily.   . sertraline (ZOLOFT) 100 MG tablet Take 100 mg by mouth daily.  Marland Kitchen spironolactone (ALDACTONE) 25 MG tablet Take 1  tablet (25 mg total) by mouth daily.  . traZODone (DESYREL) 100 MG tablet Take 100 mg by mouth at bedtime.    Marland Kitchen umeclidinium-vilanterol (ANORO ELLIPTA) 62.5-25 MCG/INH AEPB Inhale 1 puff daily into the lungs.     Allergies: No Known Allergies  Social History: The patient  reports that he quit smoking about 32 years ago. His smoking use included cigarettes. He has a 60.00 pack-year smoking history. he has never used smokeless tobacco. He reports that he drinks alcohol. He reports that he does not use drugs.   Family History: The patient's family history includes Aneurysm (age of onset: 44) in his father; Breast cancer in his daughter and mother.   Review of Systems: Please see the history of present illness.   Otherwise, the review of systems is positive for none.   All other systems are reviewed and negative.   Physical Exam: VS:  BP (!) 150/80   Pulse 64   Ht 5\' 10"  (1.778 m)   Wt 212 lb 12.8 oz (96.5 kg)   SpO2 90%   BMI 30.53 kg/m  .  BMI Body mass index is 30.53 kg/m.  Wt Readings from Last 3 Encounters:  08/05/17 212 lb 12.8 oz (96.5 kg)  07/23/17 212 lb (96.2 kg)  07/08/17 213 lb 12.8 oz (97 kg)   BP recheck by me is 140/80  General: Pleasant. Well developed, well nourished and in no acute distress.   HEENT: Normal.  Neck: Supple, no JVD, carotid bruits, or masses noted.  Cardiac: Regular rate and rhythm. Heart tones distant. No edema.  Respiratory:  Decreased breath sounds noted but with normal work of breathing.  GI: Soft and nontender.  MS: No deformity or atrophy. Gait and ROM intact. He has a cane with him.  Skin: Warm and dry. Color is normal.  Neuro:  Strength and sensation are intact and no gross focal deficits noted.  Psych: Alert, appropriate and with normal affect.   LABORATORY DATA:  EKG:  EKG is not ordered today.  Lab Results  Component Value Date   WBC 5.6 10/14/2011   HGB 14.6 10/14/2011   HCT 42.8 10/14/2011   PLT 142.0 (L) 10/14/2011    GLUCOSE 88 07/22/2017   ALT 28 04/05/2011   AST 32 04/05/2011   NA 142 07/22/2017   K 4.7 07/22/2017   CL 101 07/22/2017   CREATININE 1.01 07/22/2017   BUN 25 07/22/2017   CO2 25 07/22/2017   TSH 1.66 10/14/2011   INR 1.26 04/07/2011       BNP (last 3 results) No results for input(s): BNP in the last 8760 hours.  ProBNP (last 3  results) No results for input(s): PROBNP in the last 8760 hours.   Other Studies Reviewed Today:  Myoview Impression from March 2016 Exercise Capacity: [place] with no exercise. BP Response: Normal blood pressure response. Clinical Symptoms: Nausea. ECG Impression: Atrial fibrillation, no changes from baseline.  Comparison with Prior Nuclear Study: No images to compare  Overall Impression: Low risk stress nuclear study a medium-sized basal inferior and basal to mid inferolateral perfusion defect that is actually worse at rest than with stress. No ischemia. Given normal wall motion, this may represent diaphragmatic attenuation. .  LV Ejection Fraction: 63%. LV Wall Motion: NL LV Function; NL Wall Motion   Loralie Champagne 11/07/2014   Echo Study Conclusions from February 2016  - Left ventricle: The cavity size was normal. Wall thickness was increased in a pattern of mild LVH. Systolic function was normal. The estimated ejection fraction was in the range of 60% to 65%. Wall motion was normal; there were no regional wall motion abnormalities. - Aortic valve: There was trivial regurgitation. - Left atrium: The atrium was mildly dilated. - Right atrium: The atrium was mildly dilated.  Assessment/Plan:  1. HTN - Aldactone recently added - BP has improved - will leave him on his current regimen for now - BMET today - he will continue to monitor for me at home.   2. Chronic Dyspnea - seems stable. His cardiac studies from 2016 were stable. Back now seeing pulmonary - he understands he needs to keep working on endurance - he  has lost weight. Down one more pound over this past month - hope he can keep up the good work.   3. PVCs - past ablation - no problems reported. Regular rhythm on exam today.    4. Obesity - actively losing weight.   5. Depression - long standing issue - not discussed today but his demeanor seemed better to me today.   6. HLD - on statin - labs checked by PCP  7. CAD - managed medically - no active symptoms.   Current medicines are reviewed with the patient today.  The patient does not have concerns regarding medicines other than what has been noted above.  The following changes have been made:  See above.  Labs/ tests ordered today include:    Orders Placed This Encounter  Procedures  . Basic metabolic panel     Disposition:   FU with me in about 3 months. BMET today. Overall, I am happy with how he is doing.   Patient is agreeable to this plan and will call if any problems develop in the interim.   SignedTruitt Merle, NP  08/05/2017 8:23 AM  [facility] [address] [place], [place]  [address] Phone: [phone] Fax: [phone]

## 2017-08-06 ENCOUNTER — Telehealth (HOSPITAL_COMMUNITY): Payer: Self-pay

## 2017-08-06 NOTE — Telephone Encounter (Signed)
Patient returned phone call in regards to Pulmonary Rehab - Scheduled orientation on 09/12/2017. Patient will attend the 10:30am exc class.

## 2017-08-07 ENCOUNTER — Ambulatory Visit: Payer: Medicare Other | Admitting: Acute Care

## 2017-08-11 ENCOUNTER — Ambulatory Visit (INDEPENDENT_AMBULATORY_CARE_PROVIDER_SITE_OTHER): Payer: Medicare Other | Admitting: Acute Care

## 2017-08-11 ENCOUNTER — Encounter: Payer: Self-pay | Admitting: Acute Care

## 2017-08-11 VITALS — BP 118/60 | HR 58 | Ht 70.0 in | Wt 215.4 lb

## 2017-08-11 DIAGNOSIS — J432 Centrilobular emphysema: Secondary | ICD-10-CM

## 2017-08-11 DIAGNOSIS — R5381 Other malaise: Secondary | ICD-10-CM | POA: Insufficient documentation

## 2017-08-11 MED ORDER — UMECLIDINIUM-VILANTEROL 62.5-25 MCG/INH IN AEPB: 1.0000 | INHALATION_SPRAY | Freq: Every day | RESPIRATORY_TRACT | 3 refills | Status: DC

## 2017-08-11 MED ORDER — UMECLIDINIUM-VILANTEROL 62.5-25 MCG/INH IN AEPB: 1.0000 | INHALATION_SPRAY | Freq: Every day | RESPIRATORY_TRACT | 0 refills | Status: DC

## 2017-08-11 NOTE — Progress Notes (Signed)
History of Present Illness [name] is a 83 y.o. male with COPD, diaphragm weakness and OSA. He is followed by D. [name].  Synopsis: Former patient of Dr. Gwenette [name] who is referred in 2015 for shortness of breath. Found to have evidence of diaphragm weakness as well as COPD. Also treated for obstructive sleep apnea.  08/11/2017 2 week follow up per Dr. Lake [name]: Pt was seen 11/14 by Dr. Lake [name]. Per his wife his dyspnea is worse with minimal activity. Dr. Lake [name] feels this is due to COPD, paralyzed diaphragm, and muscular deconditioning which are all contributing. ( Per his wife he is inactive) Pt was started on Anoro once daily after that visit. He is here for follow up to see if there is any improvement in his exertional dyspnea after therapeutic trial with Anoro. Pt,. States he feels the Anoro is helping him. He feels less short of breath with the Anoro. He stopped taking it Friday, as he ran out of the sample, and has noticed he is more short of breath without it. He states he has been contacted by Pulmonary rehab, and plans to follow through with the therapy.He states he is compliant with his CPAP. He denies fever, chest pain, orthopnea or hemoptysis.  Test Results: Spirometry 07/23/2017>>Very severe restriction.  PFT PFT's 08/2014:  FEV1 0.92 (31%), ratio 55, decrease in muscle pressures, mild restriction, could not do DLCO maneuver.  CXR, CT abdomen, and diaphragm fluoroscopy images indicate mild left hemidiaphragm elevation .  CBC Latest Ref Rng & Units 10/14/2011 04/06/2011 04/05/2011  WBC 4.5 - 10.5 K/uL 5.6 9.1 10.7(H)  Hemoglobin 13.0 - 17.0 g/dL 14.6 13.6 13.5  Hematocrit 39.0 - 52.0 % 42.8 39.8 39.8  Platelets 150.0 - 400.0 K/uL 142.0(L) 206 219    BMP Latest Ref Rng & Units 08/05/2017 07/22/2017 07/08/2017  Glucose 65 - 99 mg/dL 103(H) 88 96  BUN 8 - 27 mg/dL 35(H) 25 23  Creatinine 0.76 - 1.27 mg/dL 0.92 1.01 0.87  BUN/Creat Ratio 10 - 24 38(H) 25(H) 26(H)  Sodium  134 - 144 mmol/L 141 142 142  Potassium 3.5 - 5.2 mmol/L 4.3 4.7 4.6  Chloride 96 - 106 mmol/L 103 101 100  CO2 20 - 29 mmol/L 24 25 27   Calcium 8.6 - 10.2 mg/dL 9.2 9.2 9.5    ProBNP    Component Value Date/Time   PROBNP 7150.0 (H) 04/03/2011 0127    PFT    Component Value Date/Time   FEV1PRE 0.92 08/25/2014 1056   FEV1POST 1.04 08/25/2014 1056   FVCPRE 1.68 08/25/2014 1056   FVCPOST 1.72 08/25/2014 1056   TLC 4.51 08/25/2014 1056   DLCOUNC 5.24 08/25/2014 1056   PREFEV1FVCRT 55 08/25/2014 1056   PSTFEV1FVCRT 61 08/25/2014 1056    Dg Chest 2 View  Result Date: 07/24/2017 CLINICAL DATA:  Chronic shortness of breath. EXAM: CHEST  2 VIEW COMPARISON:  08/16/2014. FINDINGS: Cardiomegaly. Scarring RIGHT base with mild CP blunting. No consolidation or edema. No pneumothorax. Thoracic spondylosis. IMPRESSION: Stable chest.  Chronic changes as described. Electronically Signed   By: [name] M.D.   On: 07/24/2017 08:22     Past medical hx Past Medical History:  Diagnosis Date  . BPH (benign prostatic hyperplasia)   . Coronary artery ectasia    Mild CAD with normal systolic function per cath in August of 2011  . Diaphragmatic paralysis    felt to be partially responsible for dyspnea  . Dyspnea    due to paralyzed diaphragm and  pulmonary issues  . Gout   . HTN (hypertension)   . Hypercholesteremia   . Microhematuria   . Obesity   . OSA (obstructive sleep apnea)   . Paroxysmal atrial fibrillation (HCC)    occured in the setting of acute E Coli sepsis and ileius 7/12  . PVC (premature ventricular contraction)    s/p PVC ablation 05/29/2010     Social History   Tobacco Use  . Smoking status: Former Smoker    Packs/day: 3.00    Years: 20.00    Pack years: 60.00    Types: Cigarettes    Last attempt to quit: 09/09/1984    Years since quitting: 32.9  . Smokeless tobacco: Never Used  Substance Use Topics  . Alcohol use: Yes    Alcohol/week: 0.0 oz    Comment: 1  glass of wine per week  . Drug use: No    Mr.[name] reports that he quit smoking about 32 years ago. His smoking use included cigarettes. He has a 60.00 pack-year smoking history. he has never used smokeless tobacco. He reports that he drinks alcohol. He reports that he does not use drugs.  Tobacco Cessation: Former smoker quit 1986  Past surgical hx, Family hx, Social hx all reviewed.  Current Outpatient Medications on File Prior to Visit  Medication Sig  . allopurinol (ZYLOPRIM) 300 MG tablet Take 300 mg by mouth daily.    Marland Kitchen amLODipine (NORVASC) 5 MG tablet Take 5 mg by mouth daily.  Marland Kitchen buPROPion (WELLBUTRIN SR) 100 MG 12 hr tablet Take 100 mg by mouth 2 (two) times daily.  Marland Kitchen dicyclomine (BENTYL) 20 MG tablet Take 20 mg by mouth 2 (two) times daily.   Marland Kitchen doxazosin (CARDURA) 4 MG tablet Take 1 tablet (4 mg total) by mouth daily.  Marland Kitchen ibuprofen (ADVIL,MOTRIN) 200 MG tablet Take 200-400 mg by mouth every 6 (six) hours as needed (body aches). For pain  . lisinopril (PRINIVIL,ZESTRIL) 40 MG tablet Take 40 mg by mouth daily.   Marland Kitchen lovastatin (MEVACOR) 40 MG tablet Take 40 mg by mouth at bedtime.    . metoprolol succinate (TOPROL-XL) 25 MG 24 hr tablet Take 25 mg by mouth 2 (two) times daily.   . sertraline (ZOLOFT) 100 MG tablet Take 100 mg by mouth daily.  Marland Kitchen spironolactone (ALDACTONE) 25 MG tablet Take 1 tablet (25 mg total) by mouth daily.  . traZODone (DESYREL) 100 MG tablet Take 100 mg by mouth at bedtime.    Marland Kitchen umeclidinium-vilanterol (ANORO ELLIPTA) 62.5-25 MCG/INH AEPB Inhale 1 puff daily into the lungs.  Marland Kitchen albuterol (PROVENTIL) (5 MG/ML) 0.5% nebulizer solution Take 2.5 mg by nebulization every 6 (six) hours as needed for wheezing or shortness of breath.  Marland Kitchen aspirin 81 MG tablet Take 81 mg by mouth daily.     No current facility-administered medications on file prior to visit.      No Known Allergies  Review Of Systems:  Constitutional:   No  weight loss, night sweats,  Fevers, chills,  fatigue, or  lassitude.  HEENT:   No headaches,  Difficulty swallowing,  Tooth/dental problems, or  Sore throat,                No sneezing, itching, ear ache, nasal congestion, post nasal drip,   CV:  No chest pain,  Orthopnea, PND, swelling in lower extremities, anasarca, dizziness, palpitations, syncope.   GI  No heartburn, indigestion, abdominal pain, nausea, vomiting, diarrhea, change in bowel habits, loss of appetite, bloody stools.  Resp: + shortness of breath with exertion less at rest.  No excess mucus, no productive cough,  No non-productive cough,  No coughing up of blood.  No change in color of mucus.  No wheezing.  No chest wall deformity  Skin: no rash or lesions.  GU: no dysuria, change in color of urine, no urgency or frequency.  No flank pain, no hematuria   MS:  No joint pain or swelling.  No decreased range of motion.  No back pain.  Psych:  No change in mood or affect. No depression or anxiety.  No memory loss.   Vital Signs BP 118/60 (BP Location: Left Arm, Cuff Size: Normal)   Pulse (!) 58   Ht 5\' 10"  (1.778 m)   Wt 215 lb 6.4 oz (97.7 kg)   SpO2 92%   BMI 30.91 kg/m    Physical Exam:  General- No distress,  A&Ox3, pleasant, walks with cane ENT: No sinus tenderness, TM clear, pale nasal mucosa, no oral exudate,no post nasal drip, no LAN Cardiac: S1, S2, regular rate and rhythm, no murmur Chest: No wheeze/ rales/ dullness; no accessory muscle use, no nasal flaring, no sternal retractions Abd.: Soft Non-tender, obese Ext: No clubbing cyanosis, edema Neuro:  deconditioned Skin: No rashes, warm and dry Psych: normal mood and behavior   Assessment/Plan  COPD (chronic obstructive pulmonary disease) with emphysema (HCC) Therapeutic trial with Anoro with positive results, less dyspnea Plan: Please continue Anoro 1 puff once daily Remember to rinse your mouth after use. We will send in a prescription and give you more samples  today Follow up with Dr.  Lake [name]  In 6 months or before as needed.  Please contact office for sooner follow up if symptoms do not improve or worsen or seek emergency care    Obstructive sleep apnea States he is compliant with CPAP Plan: Continue on CPAP at bedtime.  Goal is to wear for at least 6 hours each night for maximal clinical benefit. Continue to work on weight loss, as the link between excess weight  and sleep apnea is well established.  Do not drive if sleepy. Remember to clean mask, tubing, filter, and reservoir once weekly with soapy water.  Follow up with Dr. Lake [name]  In 6 months or before as needed.  Please contact office for sooner follow up if symptoms do not improve or worsen or seek emergency care    Physical deconditioning Please follow through with Pulmonary Rehab    Magdalen Spatz, NP 08/11/2017  9:34 AM

## 2017-08-11 NOTE — Assessment & Plan Note (Signed)
States he is compliant with CPAP Plan: Continue on CPAP at bedtime.  Goal is to wear for at least 6 hours each night for maximal clinical benefit. Continue to work on weight loss, as the link between excess weight  and sleep apnea is well established.  Do not drive if sleepy. Remember to clean mask, tubing, filter, and reservoir once weekly with soapy water.  Follow up with Dr. [name]  In 6 months or before as needed.  Please contact office for sooner follow up if symptoms do not improve or worsen or seek emergency care

## 2017-08-11 NOTE — Progress Notes (Signed)
Reviewed, agree 

## 2017-08-11 NOTE — Assessment & Plan Note (Signed)
Please follow through with Pulmonary Rehab

## 2017-08-11 NOTE — Patient Instructions (Signed)
It is good to see you today. Please continue Anoro 1 puff once daily Remember to rinse your mouth after use. Follow up with Pulmonary rehab as you are doing. Continue on CPAP at bedtime.  Goal is to wear for at least 6 hours each night for maximal clinical benefit. Continue to work on weight loss, as the link between excess weight  and sleep apnea is well established.  Do not drive if sleepy. Remember to clean mask, tubing, filter, and reservoir once weekly with soapy water.  Follow up with Dr. [name]  In 6 months or before as needed.  Please contact office for sooner follow up if symptoms do not improve or worsen or seek emergency care

## 2017-08-11 NOTE — Assessment & Plan Note (Signed)
Therapeutic trial with Anoro with positive results, less dyspnea Plan: Please continue Anoro 1 puff once daily Remember to rinse your mouth after use. We will send in a prescription and give you more samples  today Follow up with Dr. [name]  In 6 months or before as needed.  Please contact office for sooner follow up if symptoms do not improve or worsen or seek emergency care

## 2017-08-11 NOTE — Addendum Note (Signed)
Addended by: Jannette Spanner on: 08/11/2017 12:00 PM   Modules accepted: Orders

## 2017-08-13 ENCOUNTER — Other Ambulatory Visit: Payer: Medicare Other

## 2017-09-12 ENCOUNTER — Telehealth: Payer: Self-pay | Admitting: Pulmonary Disease

## 2017-09-12 ENCOUNTER — Encounter (HOSPITAL_COMMUNITY)
Admission: RE | Admit: 2017-09-12 | Discharge: 2017-09-12 | Disposition: A | Discharge status: Home / Self Care | Payer: Medicare Other | Source: Ambulatory Visit | Attending: Pulmonary Disease | Admitting: Pulmonary Disease

## 2017-09-12 NOTE — Telephone Encounter (Signed)
I received the form from [place], I placed the form in BQ look at folder to sign if he aggrees.

## 2017-09-12 NOTE — Progress Notes (Addendum)
Pulmonary Rehab Orientation Note: Patient presented to pulmonary rehab orientation as scheduled. Patients initial complaint is balance. He states his activity is extremely limited by fear of falling and balance. He states he has fallen approximately 10 times within the last hear. Patient states he cannot get up himself and has to rely on his wife or someone to come help if he is in his home. He is fearful of walking up steps or stairs. He has an unsteady gait and walks with a cane. He denies shortness of breath or hypoxia as a factor in the falls. Discussed safety concerns with patient. Recommended patient consider balance retraining program prior to pulmonary rehab. Brochure given. Pulmonology office called and message left for RN to call back to discuss patient conversation and balance retraining referral. Patient will not be admitted to pulmonary rehab at this time. Will admit once patient has completed balance program if MD feels appropriate. Will follow-up.

## 2017-09-23 NOTE — Telephone Encounter (Signed)
Form has been signed and faxed back to pulm rehab.  Nothing further needed.

## 2017-09-23 NOTE — Telephone Encounter (Signed)
[name], please advise on the status on this form.  Thanks!

## 2017-09-26 ENCOUNTER — Ambulatory Visit: Payer: Medicare Other | Attending: Pulmonary Disease | Admitting: Rehabilitation

## 2017-09-26 DIAGNOSIS — R2681 Unsteadiness on feet: Secondary | ICD-10-CM | POA: Insufficient documentation

## 2017-09-26 DIAGNOSIS — R2689 Other abnormalities of gait and mobility: Secondary | ICD-10-CM | POA: Insufficient documentation

## 2017-09-26 DIAGNOSIS — M6281 Muscle weakness (generalized): Secondary | ICD-10-CM | POA: Insufficient documentation

## 2017-09-26 DIAGNOSIS — R293 Abnormal posture: Secondary | ICD-10-CM | POA: Insufficient documentation

## 2017-09-26 DIAGNOSIS — R296 Repeated falls: Secondary | ICD-10-CM

## 2017-09-26 NOTE — Therapy (Signed)
[facility] [address] [place], [place], [address] Phone: [phone]   Fax:  [phone]  Patient Details  Name: [name] MRN: [id number] Date of Birth: 06-22-1934 Referring Provider:  Juanito Doom, MD  Encounter Date: 09/26/2017   Pt arrived for PT evaluation today.   Note that he had a fall approx 3 days ago and had open wound to L knee (he had bandaged but PT was able to look at) and had pulled muscle in R calf.  With questioning, pt did hear a pop in R calf when it happened.  PT recommended he visit PCP or urgent care today in order to get clearance to continue PT.  Re-scheduled visit for Monday pending he is cleared for therapy.      Cameron Sprang, PT, MPT [facility] [address] [place], [place], [address] Phone: [phone]   Fax:  [phone] 09/26/17, 1:29 PM

## 2017-09-29 ENCOUNTER — Ambulatory Visit: Payer: Medicare Other | Admitting: Rehabilitation

## 2017-09-29 ENCOUNTER — Encounter: Payer: Self-pay | Admitting: Rehabilitation

## 2017-09-29 ENCOUNTER — Other Ambulatory Visit: Payer: Self-pay

## 2017-09-29 DIAGNOSIS — R2681 Unsteadiness on feet: Secondary | ICD-10-CM | POA: Diagnosis present

## 2017-09-29 DIAGNOSIS — M6281 Muscle weakness (generalized): Secondary | ICD-10-CM

## 2017-09-29 DIAGNOSIS — R293 Abnormal posture: Secondary | ICD-10-CM

## 2017-09-29 DIAGNOSIS — R2689 Other abnormalities of gait and mobility: Secondary | ICD-10-CM | POA: Diagnosis present

## 2017-09-29 DIAGNOSIS — R296 Repeated falls: Secondary | ICD-10-CM | POA: Diagnosis not present

## 2017-09-29 NOTE — Therapy (Signed)
[facility] [address] [place], [place], [address] Phone: [phone]   Fax:  [phone]  Physical Therapy Evaluation  Patient Details  Name: [name] MRN: [id number] Date of Birth: 08/21/1934 Referring Provider: Simonne Maffucci, MD   Encounter Date: 09/29/2017  PT End of Session - 09/29/17 1252    Visit Number  1    Number of Visits  9    Date for PT Re-Evaluation  11/28/17    Authorization Type  UHC MCR    PT Start Time  0846    PT Stop Time  0930    PT Time Calculation (min)  44 min    Activity Tolerance  Patient tolerated treatment well    Behavior During Therapy  [facility] for tasks assessed/performed       Past Medical History:  Diagnosis Date  . BPH (benign prostatic hyperplasia)   . Coronary artery ectasia    Mild CAD with normal systolic function per cath in August of 2011  . Diaphragmatic paralysis    felt to be partially responsible for dyspnea  . Dyspnea    due to paralyzed diaphragm and pulmonary issues  . Gout   . HTN (hypertension)   . Hypercholesteremia   . Microhematuria   . Obesity   . OSA (obstructive sleep apnea)   . Paroxysmal atrial fibrillation (HCC)    occured in the setting of acute E Coli sepsis and ileius 7/12  . PVC (premature ventricular contraction)    s/p PVC ablation 05/29/2010    Past Surgical History:  Procedure Laterality Date  . APPENDECTOMY  1978  . CARDIAC CATHETERIZATION  04-30-2010   Left main coronary artery is normal.   . CARDIOVASCULAR STRESS TEST  03-19-2010   0%  . CHOLECYSTECTOMY  1990  . poly removed from nose as a child    . PVC ablation  05/29/2010  . US ECHOCARDIOGRAPHY  03-09-2010   EF 55-60%    There were no vitals filed for this visit.   Subjective Assessment - 09/29/17 0849    Subjective  "I guess I'm wanting to work on my balance."     Limitations  House hold activities;Walking    Patient Stated Goals  "I want to improve my balance."      Currently in Pain?  No/denies         [facility] PT Assessment - 09/29/17 0001      Assessment   Medical Diagnosis  repeated falls, balance    Referring Provider  Simonne Maffucci, MD    Onset Date/Surgical Date  -- Notes decline in balance over the last year    Prior Therapy  No prior PT       Precautions   Precautions  Fall      Restrictions   Weight Bearing Restrictions  No      Balance Screen   Has the patient fallen in the past 6 months  Yes    How many times?  6-8    Has the patient had a decrease in activity level because of a fear of falling?   Yes    Is the patient reluctant to leave their home because of a fear of falling?   Yes      [place] residence    Living Arrangements  Spouse/significant other    Available Help at Discharge  Family    Type of Home  Other(Comment) [facility]  Access  Stairs to enter    Entrance Stairs-Number of Steps  3    Entrance Stairs-Rails  None    Home Layout  Two level;Bed/bath upstairs    Alternate Level Stairs-Number of Steps  13    Alternate Level Stairs-Rails  Right    Home Equipment  Cane - single point;Walker - 4 wheels tub/shower     Additional Comments  Has used SPC for just under a year.  Uses rollator if walking longer distances.        Prior Function   Level of Independence  Independent;Independent with community mobility with device    Vocation  Retired    Leisure  Likes to be with grandson      Cognition   Overall Cognitive Status  Within Functional Limits for tasks assessed      Sensation   Light Touch  Impaired Detail    Light Touch Impaired Details  Impaired RLE;Impaired RUE;Impaired LUE since calf injury, some numbness in fingertips    Hot/Cold  Appears Intact    Proprioception  Appears Intact      Coordination   Gross Motor Movements are Fluid and Coordinated  Yes      ROM / Strength   AROM / PROM / Strength  Strength      Strength   Overall Strength  Deficits     Overall Strength Comments  R hip flex 2/5 (seated), L hip flex 2+/5 (seated), R and L knee ext 4/5, R knee flex 3+/5, L knee flex 4/5, R ankle DF 3+/5, L ankle DF 4/5, B ankle PF 4/5      Transfers   Transfers  Sit to Stand;Stand to Sit    Sit to Stand  6: Modified independent (Device/Increase time)    Five time sit to stand comments   15.03 secs with single UE support    Stand to Sit  6: Modified independent (Device/Increase time)      Ambulation/Gait   Ambulation/Gait  Yes    Ambulation/Gait Assistance  5: Supervision;4: Min guard    Ambulation/Gait Assistance Details  Pt ambulates with cane, wide BOS, Trendelenburg due to R sided>L sided weakness    Ambulation Distance (Feet)  115 Feet    Assistive device  Straight cane    Gait Pattern  Step-through pattern;Decreased stride length;Decreased dorsiflexion - right;Trendelenburg;Lateral hip instability;Wide base of support;Trunk flexed    Ambulation Surface  Level;Indoor    Gait velocity  2.16 ft/sec with [facility]      Standardized Balance Assessment   Standardized Balance Assessment  Berg Balance Test      Berg Balance Test   Sit to Stand  Able to stand  independently using hands    Standing Unsupported  Able to stand safely 2 minutes    Sitting with Back Unsupported but Feet Supported on Floor or Stool  Able to sit safely and securely 2 minutes    Stand to Sit  Sits safely with minimal use of hands    Transfers  Able to transfer safely, definite need of hands    Standing Unsupported with Eyes Closed  Able to stand 10 seconds with supervision    Standing Ubsupported with Feet Together  Able to place feet together independently and stand 1 minute safely    From Standing, Reach Forward with Outstretched Arm  Can reach forward >12 cm safely (5")    From Standing Position, Pick up Object from Floor  Able to pick up shoe, needs supervision  From Standing Position, Turn to Look Behind Over each Shoulder  Needs supervision when turning     Turn 360 Degrees  Able to turn 360 degrees safely but slowly    Standing Unsupported, Alternately Place Feet on Step/Stool  Able to complete >2 steps/needs minimal assist    Standing Unsupported, One Foot in [place] to take small step independently and hold 30 seconds    Standing on One Leg  Tries to lift leg/unable to hold 3 seconds but remains standing independently    Total Score  38    Berg comment:  < 36 high risk for falls (close to 100%)              Objective measurements completed on examination: See above findings.              PT Education - 09/29/17 1251    Education provided  Yes    Education Details  evaluation findings, POC, goals    Person(s) Educated  Patient    Methods  Explanation    Comprehension  Verbalized understanding       PT Short Term Goals - 09/29/17 1306      PT SHORT TERM GOAL #1   Title  Pt will initiate HEP in order to indicate improved functional mobility and decreased fall risk.  (Target Date: 10/29/17)    Time  4    Period  Weeks    Status  New    Target Date  10/29/17      PT SHORT TERM GOAL #2   Title  Pt will perform 5TSS in </=15 secs without UE support in order to indicate improved functional strength.      Time  4    Period  Weeks    Status  New      PT SHORT TERM GOAL #3   Title  Will assess 6MWT and improve distance by 52' in order to indicate improved functional endurance.     Time  4    Period  Weeks    Status  New      PT SHORT TERM GOAL #4   Title  Pt will improve BERG balance score to 41/56 in order to indicate decreased fall risk.      Time  4    Period  Weeks    Status  New      PT SHORT TERM GOAL #5   Title  Pt will ambulate 250' w/ LRAD over unlevel outdoor surfaces including curb/ramp at mod I level in order to indicate safe return to community mobility.      Time  4    Period  Weeks    Status  New        PT Long Term Goals - 09/29/17 1309      PT LONG TERM GOAL #1   Title  Pt will be  independent with HEP in order to indicate improved functional mobility and decreased fall risk.  (Target Date: 11/28/17)    Time  8    Period  Weeks    Status  New      PT LONG TERM GOAL #2   Title  Pt will improve BERG balance score to 44/56 in order to indicate decreased fall risk.      Time  8    Period  Weeks    Status  New      PT LONG TERM GOAL #3   Title  Pt will improve 6MWT  by 150' from baseline in order to indicate improved functional endurance.      Time  8    Period  Weeks    Status  New      PT LONG TERM GOAL #4   Title  Pt will ambulate 150' over indoor surfaces without cane in order to indicate improved household mobility.      Time  8    Period  Weeks    Status  New      PT LONG TERM GOAL #5   Title  Pt will ambulate with gait speed >/=2.62 ft/sec w/ LRAD in order to indicate pt is safe community ambulator.      Time  8    Period  Weeks    Status  New             Plan - 09/29/17 1252    Clinical Impression Statement  Pt presents with decreasing balance over the last year with 6-8 falls reported in the last 6 months, one of which happened 3 days prior to original date of evaluation.  PT had recommended that he go to MD to ensure that L knee wound did not need stitches and that R calf not torn due to pt hearing pop.  Pt reports that he was cleared to continue with therapy.  Note history of COPD, CAD, and HTN which could all impact progress in therapy.  Note he was set to join pulmonary rehab, however was too unsteady.  Upon PT evaluation, note gait speed 2.16 ft/sec, indicative that pt not safe community ambulator, BERG balance score of 38/56 indicative of high fall risk and 5TSS requiring 15.03 secs with heavy UE support to complete task, indicative of decreased functional strength.  Pt will benefit from skilled OP neuro PT in order to address deficits.      History and Personal Factors relevant to plan of care:  COPD, HTN, CAD    Clinical Presentation  Evolving     Clinical Decision Making  Moderate    Rehab Potential  Good    Clinical Impairments Affecting Rehab Potential  co-morbidities    PT Frequency  1x / week would recommend 2x/wk however he has high copay    PT Duration  8 weeks    PT Treatment/Interventions  ADLs/Self Care Home Management;DME Instruction;Gait training;Stair training;Functional mobility training;Therapeutic activities;Therapeutic exercise;Balance training;Neuromuscular re-education;Cognitive remediation;Patient/family education;Orthotic Fit/Training;Passive range of motion;Energy conservation;Vestibular    PT Next Visit Plan  6MWT (will likely need rest breaks-check O2), initiate HEP for BLE strength-esp hip (sit<>stand, hip abd, flex), keep a check on his R foot esp when fatigued-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    Consulted and Agree with Plan of Care  Patient       Patient will benefit from skilled therapeutic intervention in order to improve the following deficits and impairments:  Cardiopulmonary status limiting activity, Decreased activity tolerance, Decreased balance, Decreased endurance, Decreased knowledge of use of DME, Decreased mobility, Decreased strength, Difficulty walking, Impaired flexibility, Impaired perceived functional ability, Postural dysfunction  Visit Diagnosis: Repeated falls - Plan: PT plan of care cert/re-cert  Unsteadiness on feet - Plan: PT plan of care cert/re-cert  Other abnormalities of gait and mobility - Plan: PT plan of care cert/re-cert  Abnormal posture - Plan: PT plan of care cert/re-cert  Muscle weakness (generalized) - Plan: PT plan of care cert/re-cert     Problem List Patient Active  Problem List   Diagnosis Date Noted  . Physical deconditioning 08/11/2017  . Shortness of breath 10/24/2014  . COPD (chronic obstructive pulmonary disease) with emphysema ([place]) 09/01/2014  . Fatigue 10/14/2011  . Atrial  fibrillation ([place]) 05/15/2011  . Premature ventricular contractions (PVCs) (VPCs) 02/25/2011  . 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  . PERSISTENT DISORDER INITIATING/MAINTAINING SLEEP 05/10/2010  . HYPERCHOLESTEROLEMIA 05/09/2010  . GOUT 05/09/2010  . OBESITY 05/09/2010  . Obstructive sleep apnea 05/09/2010  . HYPERTENSION 05/09/2010  . VENTRICULAR TACHYCARDIA 05/09/2010    Cameron Sprang, PT, MPT [facility] [address] [place], [place], [address] Phone: [phone]   Fax:  [phone] 09/29/17, 1:16 PM  Name: [name] MRN: [id number] Date of Birth: [birth year]

## 2017-10-06 ENCOUNTER — Ambulatory Visit: Payer: Medicare Other | Admitting: Rehabilitation

## 2017-10-06 ENCOUNTER — Encounter: Payer: Self-pay | Admitting: Rehabilitation

## 2017-10-06 DIAGNOSIS — R296 Repeated falls: Secondary | ICD-10-CM

## 2017-10-06 DIAGNOSIS — M6281 Muscle weakness (generalized): Secondary | ICD-10-CM

## 2017-10-06 DIAGNOSIS — R2689 Other abnormalities of gait and mobility: Secondary | ICD-10-CM

## 2017-10-06 DIAGNOSIS — R2681 Unsteadiness on feet: Secondary | ICD-10-CM

## 2017-10-06 NOTE — Patient Instructions (Addendum)
WALKING  Walking is a great form of exercise to increase your strength, endurance and overall fitness.  A walking program can help you start slowly and gradually build endurance as you go.  Everyone's ability is different, so each person's starting point will be different.  You do not have to follow them exactly.  The are just samples. You should simply find out what's right for you and stick to that program.   In the beginning, you'll start off walking 2-3 times a day for short distances.  As you get stronger, you'll be walking further at just 1-2 times per day.  A. You Can Walk For A Certain Length Of Time Each Day    Walk 4-5 minutes 3-4 times per day.  Increase 1 minute every 7 days (3 times per day).  Work up to 25-30 minutes (1-2 times per day).   Example:   Day 1-2 4-5 minutes 3 times per day   Day 7-8 5-6 minutes 2-3 times per day   Day 13-14 6-7 minutes 1-2 times per day  B. You Can Walk For a Certain Distance Each Day     Distance can be substituted for time.    Example:   3 trips to mailbox (at road)   3 trips to corner of block   3 trips around the block  C. Go to local [employer] and use the track.    Walk for distance ____ around track  Or time ____ minutes  Please only do the exercises that your therapist has initialed and dated   MAKE SURE YOU DO PURSED LIP BREATHING (IN THROUGH YOUR NOSE AND BLOW OUT THROUGH YOUR MOUTH) WHEN YOU ARE RESTING.     Sit to Stand: Head Upright    With head upright, stand up slowly with eyes open.  Start with your hands on your lap and work towards doing with no hands. If you use a loose chair, put it against the wall so it won't slide away.  Repeat __10__ times per session. Do __2__ sessions per day.  Copyright  VHI. All rights reserved.    Standing Hip Extension    Bring leg back as far as possible. Use __2__ lbs on ankle. Repeat with other leg. Try your ankle weights, if you can do 8-10 reps without losing your form,  then that's ok, but if its too difficult, remove the weights and do 10-15 reps instead.   Repeat __10__ times. Do _2___ sessions per day.  http://gt2.exer.us/395   Copyright  VHI. All rights reserved.   HIP: Abduction - Standing    Squeeze glutes. Raise leg out and slightly back.  Use 2lbs ankle weight.  _10__ reps per set, __2_ sets per day, __5-7_ days per week Hold onto a support.  Copyright  VHI. All rights reserved.   "I love a Parade" Lift    Using a chair or countertop if necessary, march in place as high as you can.  Do this slowly and use as light of support as you can to challenge your balance.  Repeat _10___ times on each side. Do __2__ sessions per day.  http://gt2.exer.us/344   Copyright  VHI. All rights reserved.

## 2017-10-06 NOTE — Therapy (Signed)
[facility] [address] [place], [place], [address] Phone: [phone]   Fax:  [phone]  Physical Therapy Treatment  Patient Details  Name: [name] MRN: [id number] Date of Birth: [birth year] Referring Provider: Simonne Maffucci, MD   Encounter Date: 10/06/2017  PT End of Session - 10/06/17 1251    Visit Number  2    Number of Visits  9    Date for PT Re-Evaluation  11/28/17    Authorization Type  UHC MCR    PT Start Time  0846    PT Stop Time  0930    PT Time Calculation (min)  44 min    Activity Tolerance  Patient tolerated treatment well    Behavior During Therapy  [facility] for tasks assessed/performed       Past Medical History:  Diagnosis Date  . BPH (benign prostatic hyperplasia)   . Coronary artery ectasia    Mild CAD with normal systolic function per cath in August of 2011  . Diaphragmatic paralysis    felt to be partially responsible for dyspnea  . Dyspnea    due to paralyzed diaphragm and pulmonary issues  . Gout   . HTN (hypertension)   . Hypercholesteremia   . Microhematuria   . Obesity   . OSA (obstructive sleep apnea)   . Paroxysmal atrial fibrillation (HCC)    occured in the setting of acute E Coli sepsis and ileius 7/12  . PVC (premature ventricular contraction)    s/p PVC ablation 05/29/2010    Past Surgical History:  Procedure Laterality Date  . APPENDECTOMY  1978  . CARDIAC CATHETERIZATION  04-30-2010   Left main coronary artery is normal.   . CARDIOVASCULAR STRESS TEST  03-19-2010   0%  . CHOLECYSTECTOMY  1990  . poly removed from nose as a child    . PVC ablation  05/29/2010  . US ECHOCARDIOGRAPHY  03-09-2010   EF 55-60%    There were no vitals filed for this visit.  Subjective Assessment - 10/06/17 0848    Subjective  Reports no changes since last visit, no falls.     Limitations  House hold activities;Walking    Patient Stated Goals  "I want to improve my balance."     Currently in Pain?  No/denies         [facility] PT Assessment - 10/06/17 0850      6 Minute Walk- Baseline   6 Minute Walk- Baseline  yes    BP (mmHg)  122/60    HR (bpm)  55    02 Sat (%RA)  94 %    Modified Borg Scale for Dyspnea  0- Nothing at all    Perceived Rate of Exertion (Borg)  6-      6 Minute walk- Post Test   6 Minute Walk Post Test  yes    BP (mmHg)  124/62    HR (bpm)  69    02 Sat (%RA)  87 % with one minute to recover to 92%    Modified Borg Scale for Dyspnea  5- Strong or hard breathing    Perceived Rate of Exertion (Borg)  15- Hard      6 minute walk test results    Aerobic Endurance Distance Walked  784    Endurance additional comments  with SPC, one standing rest break at 4 mins for approx 30 secs.  [place] Adult PT Treatment/Exercise - 10/06/17 0001      Exercises   Exercises  Other Exercises    Other Exercises   Provided initial HEP for BLE strength, see pt instruction for details on exercises performed and reps.               PT Education - 10/06/17 1251    Education provided  Yes    Education Details  6MWT findings, walking program and HEP for BLE strengthening.     Person(s) Educated  Patient    Methods  Explanation;Demonstration;Handout    Comprehension  Verbalized understanding;Returned demonstration       PT Short Term Goals - 09/29/17 1306      PT SHORT TERM GOAL #1   Title  Pt will initiate HEP in order to indicate improved functional mobility and decreased fall risk.  (Target Date: 10/29/17)    Time  4    Period  Weeks    Status  New    Target Date  10/29/17      PT SHORT TERM GOAL #2   Title  Pt will perform 5TSS in </=15 secs without UE support in order to indicate improved functional strength.      Time  4    Period  Weeks    Status  New      PT SHORT TERM GOAL #3   Title  Will assess 6MWT and improve distance by 60' in order to indicate improved functional endurance.     Time  4    Period   Weeks    Status  New      PT SHORT TERM GOAL #4   Title  Pt will improve BERG balance score to 41/56 in order to indicate decreased fall risk.      Time  4    Period  Weeks    Status  New      PT SHORT TERM GOAL #5   Title  Pt will ambulate 250' w/ LRAD over unlevel outdoor surfaces including curb/ramp at mod I level in order to indicate safe return to community mobility.      Time  4    Period  Weeks    Status  New        PT Long Term Goals - 09/29/17 1309      PT LONG TERM GOAL #1   Title  Pt will be independent with HEP in order to indicate improved functional mobility and decreased fall risk.  (Target Date: 11/28/17)    Time  8    Period  Weeks    Status  New      PT LONG TERM GOAL #2   Title  Pt will improve BERG balance score to 44/56 in order to indicate decreased fall risk.      Time  8    Period  Weeks    Status  New      PT LONG TERM GOAL #3   Title  Pt will improve 6MWT by 150' from baseline in order to indicate improved functional endurance.      Time  8    Period  Weeks    Status  New      PT LONG TERM GOAL #4   Title  Pt will ambulate 150' over indoor surfaces without cane in order to indicate improved household mobility.      Time  8    Period  Weeks    Status  New  PT LONG TERM GOAL #5   Title  Pt will ambulate with gait speed >/=2.62 ft/sec w/ LRAD in order to indicate pt is safe community ambulator.      Time  8    Period  Weeks    Status  New            Plan - 10/06/17 1252    Clinical Impression Statement  Skilled session focused on formal assessment of functional endurance with 6MWT.  Note distance of 64' with SPC and single rest break needed due to fatigue, lower than the 1368' distance that is normal for his age group.  Provided pt with walking program to address as he has treadmill at home and walking outside on paved surfaces when able.  Also provided pt with initial BLE strengthening HEP, see pt instruction.      Rehab Potential   Good    Clinical Impairments Affecting Rehab Potential  co-morbidities    PT Frequency  1x / week would recommend 2x/wk however he has high copay    PT Duration  8 weeks    PT Treatment/Interventions  ADLs/Self Care Home Management;DME Instruction;Gait training;Stair training;Functional mobility training;Therapeutic activities;Therapeutic exercise;Balance training;Neuromuscular re-education;Cognitive remediation;Patient/family education;Orthotic Fit/Training;Passive range of motion;Energy conservation;Vestibular    PT Next Visit Plan  Continue with BLE strength and balance-add balance to HEP,  keep a check on his R foot esp when fatigued-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    Consulted and Agree with Plan of Care  Patient       Patient will benefit from skilled therapeutic intervention in order to improve the following deficits and impairments:  Cardiopulmonary status limiting activity, Decreased activity tolerance, Decreased balance, Decreased endurance, Decreased knowledge of use of DME, Decreased mobility, Decreased strength, Difficulty walking, Impaired flexibility, Impaired perceived functional ability, Postural dysfunction  Visit Diagnosis: Repeated falls  Unsteadiness on feet  Other abnormalities of gait and mobility  Muscle weakness (generalized)     Problem List Patient Active Problem List   Diagnosis Date Noted  . Physical deconditioning 08/11/2017  . Shortness of breath 10/24/2014  . COPD (chronic obstructive pulmonary disease) with emphysema ([place]) 09/01/2014  . Fatigue 10/14/2011  . Atrial fibrillation ([place]) 05/15/2011  . Premature ventricular contractions (PVCs) (VPCs) 02/25/2011  . 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  . PERSISTENT DISORDER INITIATING/MAINTAINING SLEEP 05/10/2010  . HYPERCHOLESTEROLEMIA 05/09/2010  . GOUT 05/09/2010  . OBESITY 05/09/2010  . Obstructive sleep apnea 05/09/2010  .  HYPERTENSION 05/09/2010  . VENTRICULAR TACHYCARDIA 05/09/2010    Cameron Sprang, PT, MPT [facility] [address] [place], [place], [address] Phone: [phone]   Fax:  [phone] 10/06/17, 12:54 PM  Name: QUANDRE POLINSKI MRN: [id number] Date of Birth: 12/15/1933

## 2017-10-13 ENCOUNTER — Ambulatory Visit: Payer: Medicare Other | Attending: Pulmonary Disease | Admitting: Rehabilitation

## 2017-10-13 ENCOUNTER — Encounter: Payer: Self-pay | Admitting: Rehabilitation

## 2017-10-13 DIAGNOSIS — R2689 Other abnormalities of gait and mobility: Secondary | ICD-10-CM | POA: Diagnosis present

## 2017-10-13 DIAGNOSIS — R2681 Unsteadiness on feet: Secondary | ICD-10-CM | POA: Diagnosis present

## 2017-10-13 DIAGNOSIS — M6281 Muscle weakness (generalized): Secondary | ICD-10-CM | POA: Diagnosis present

## 2017-10-13 DIAGNOSIS — R296 Repeated falls: Secondary | ICD-10-CM | POA: Diagnosis not present

## 2017-10-13 NOTE — Patient Instructions (Addendum)
Stand in corner and have a loose chair in front of you for support.    Feet Together (Compliant Surface) Head Motion - Eyes Open    With eyes open, standing on compliant surface: ___pillow (can keep shoes on or take off or can put a towel over pillow)_____, feet together, move head slowly: up and down x 10 reps, and side to side x 10 reps.  Repeat __1__ times per session. Do __1-2__ sessions per day.  Copyright  VHI. All rights reserved.   Feet Apart (Compliant Surface) Arm Motion - Eyes Closed    Stand on compliant surface: __pillow______, feet shoulder width apart. Close eyes and keep arms by your side.  Repeat __3__ times per session for 20 secs each. Do __1-2__ sessions per day.  Start with your feet slightly apart and work towards moving them closer as you get better with exercise.   Copyright  VHI. All rights reserved.   Tandem Walking    Do this along the counter top at home.  Walk with each foot directly in front of other, heel of one foot touching toes of other foot with each step. Both feet straight ahead. Do 3 laps down and back.  Do 1-2 times per day.    Copyright  VHI. All rights reserved.   FUNCTIONAL MOBILITY: Toe Walking    Walk forward on toes. _3__ reps per set, __1-2_ sets per day, __5-7_ days per week Use counter top for support.  Again, do 3 laps down and back.    Copyright  VHI. All rights reserved.

## 2017-10-13 NOTE — Therapy (Signed)
[facility] [address] [place], [place], [address] Phone: [phone]   Fax:  [phone]  Physical Therapy Treatment  Patient Details  Name: [name] MRN: [id number] Date of Birth: [birth year] Referring Provider: Simonne Maffucci, MD   Encounter Date: 10/13/2017  PT End of Session - 10/13/17 0856    Visit Number  3    Number of Visits  9    Date for PT Re-Evaluation  11/28/17    Authorization Type  [employer] MCR    PT Start Time  8101 pt late to session    PT Stop Time  0931    PT Time Calculation (min)  42 min    Activity Tolerance  Patient tolerated treatment well    Behavior During Therapy  [facility] for tasks assessed/performed       Past Medical History:  Diagnosis Date  . BPH (benign prostatic hyperplasia)   . Coronary artery ectasia    Mild CAD with normal systolic function per cath in August of 2011  . Diaphragmatic paralysis    felt to be partially responsible for dyspnea  . Dyspnea    due to paralyzed diaphragm and pulmonary issues  . Gout   . HTN (hypertension)   . Hypercholesteremia   . Microhematuria   . Obesity   . OSA (obstructive sleep apnea)   . Paroxysmal atrial fibrillation (HCC)    occured in the setting of acute E Coli sepsis and ileius 7/12  . PVC (premature ventricular contraction)    s/p PVC ablation 05/29/2010    Past Surgical History:  Procedure Laterality Date  . APPENDECTOMY  1978  . CARDIAC CATHETERIZATION  04-30-2010   Left main coronary artery is normal.   . CARDIOVASCULAR STRESS TEST  03-19-2010   0%  . CHOLECYSTECTOMY  1990  . poly removed from nose as a child    . PVC ablation  05/29/2010  . US ECHOCARDIOGRAPHY  03-09-2010   EF 55-60%    There were no vitals filed for this visit.  Subjective Assessment - 10/13/17 0855    Subjective  Reports had an okay weekend, no falls.     Limitations  House hold activities;Walking    Patient Stated Goals  "I want to improve my  balance."     Currently in Pain?  No/denies                NMR:  Further assessed balance and multi-sensory deficits.  Performed corner balance feet apart, EC x 30 secs>feet together EO x 30 secs>added head turns x 10 reps side to side and up/down, feet together EC x 30 secs.  Progressed to standing on compliant surface feet apart EO x 20 secs, feet apart EO w/ head turns side/side x 10 reps and up/down x 10 reps>feet together EO x 30 secs>feet together EC x 30 secs. Added to HEP based on level of difficulty, see pt instruction.  Counter top exercises for more dynamic balance challenge; marching forwards/backwards x 2 reps, tandem walking x 2 reps (forwards only), heel walking x 2 reps and toe walking x 2 reps,  Added to HEP based on level of difficulty.  See pt instruction.  Ambulation with head turns up/down x 40', side to side x 40' and diagonals 40' each direction with use of SPC.  Note marked difficulty with horizontal and diagonal head turns, requiring min/guard to min A to correct LOB.    TE:  Ended session with gait on treadmill  as this is how he has been doing walking program at home.  Pt ambulated x 4 mins at level 1.7 mph.  Pt tolerated well but note O2 sats down to 88-89% and taking 2 mins to get back to 90%.  Cues and demonstration for pursed lip breathing.                PT Education - 10/13/17 [phone]    Education provided  Yes    Education Details  education on safe ambulation on treadmill at home.     Person(s) Educated  Patient    Methods  Explanation    Comprehension  Verbalized understanding       PT Short Term Goals - 10/06/17 1254      PT SHORT TERM GOAL #1   Title  Pt will initiate HEP in order to indicate improved functional mobility and decreased fall risk.  (Target Date: 10/29/17)    Time  4    Period  Weeks    Status  New      PT SHORT TERM GOAL #2   Title  Pt will perform 5TSS in </=15 secs without UE support in order to indicate improved  functional strength.      Time  4    Period  Weeks    Status  New      PT SHORT TERM GOAL #3   Title  Will assess 6MWT and improve distance by 24' in order to indicate improved functional endurance.     Baseline  Baseline 784' w/ SPC and single standing rest break 10/06/17    Time  4    Period  Weeks    Status  New      PT SHORT TERM GOAL #4   Title  Pt will improve BERG balance score to 41/56 in order to indicate decreased fall risk.      Time  4    Period  Weeks    Status  New      PT SHORT TERM GOAL #5   Title  Pt will ambulate 250' w/ LRAD over unlevel outdoor surfaces including curb/ramp at mod I level in order to indicate safe return to community mobility.      Time  4    Period  Weeks    Status  New        PT Long Term Goals - 09/29/17 1309      PT LONG TERM GOAL #1   Title  Pt will be independent with HEP in order to indicate improved functional mobility and decreased fall risk.  (Target Date: 11/28/17)    Time  8    Period  Weeks    Status  New      PT LONG TERM GOAL #2   Title  Pt will improve BERG balance score to 44/56 in order to indicate decreased fall risk.      Time  8    Period  Weeks    Status  New      PT LONG TERM GOAL #3   Title  Pt will improve 6MWT by 150' from baseline in order to indicate improved functional endurance.      Time  8    Period  Weeks    Status  New      PT LONG TERM GOAL #4   Title  Pt will ambulate 150' over indoor surfaces without cane in order to indicate improved household mobility.      Time  8    Period  Weeks    Status  New      PT LONG TERM GOAL #5   Title  Pt will ambulate with gait speed >/=2.62 ft/sec w/ LRAD in order to indicate pt is safe community ambulator.      Time  8    Period  Weeks    Status  New            Plan - 10/13/17 1252    Clinical Impression Statement  Skilled session focused on assessment of balance with corner and counter top tasks.  Added to HEP as approrpriate with cues to  alternate strength HEP with balance HEP for improved compliance.  Pt verbalized understanding.     Rehab Potential  Good    Clinical Impairments Affecting Rehab Potential  co-morbidities    PT Frequency  1x / week would recommend 2x/wk however he has high copay    PT Duration  8 weeks    PT Treatment/Interventions  ADLs/Self Care Home Management;DME Instruction;Gait training;Stair training;Functional mobility training;Therapeutic activities;Therapeutic exercise;Balance training;Neuromuscular re-education;Cognitive remediation;Patient/family education;Orthotic Fit/Training;Passive range of motion;Energy conservation;Vestibular    PT Next Visit Plan  Continue with BLE strength and balance,  keep a check on his R foot esp when fatigued-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    Consulted and Agree with Plan of Care  Patient       Patient will benefit from skilled therapeutic intervention in order to improve the following deficits and impairments:  Cardiopulmonary status limiting activity, Decreased activity tolerance, Decreased balance, Decreased endurance, Decreased knowledge of use of DME, Decreased mobility, Decreased strength, Difficulty walking, Impaired flexibility, Impaired perceived functional ability, Postural dysfunction  Visit Diagnosis: Repeated falls  Unsteadiness on feet  Other abnormalities of gait and mobility  Muscle weakness (generalized)     Problem List Patient Active Problem List   Diagnosis Date Noted  . Physical deconditioning 08/11/2017  . Shortness of breath 10/24/2014  . COPD (chronic obstructive pulmonary disease) with emphysema ([place]) 09/01/2014  . Fatigue 10/14/2011  . Atrial fibrillation ([place]) 05/15/2011  . Premature ventricular contractions (PVCs) (VPCs) 02/25/2011  . 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  . PERSISTENT DISORDER INITIATING/MAINTAINING SLEEP 05/10/2010  . HYPERCHOLESTEROLEMIA  05/09/2010  . GOUT 05/09/2010  . OBESITY 05/09/2010  . Obstructive sleep apnea 05/09/2010  . HYPERTENSION 05/09/2010  . VENTRICULAR TACHYCARDIA 05/09/2010    Cameron Sprang, PT, MPT [facility] [address] [place], [place], [address] Phone: [phone]   Fax:  [phone] 10/13/17, 12:54 PM  Name: [name] MRN: [id number] Date of Birth: 1934/03/04

## 2017-10-20 ENCOUNTER — Ambulatory Visit: Payer: Medicare Other | Admitting: Physical Therapy

## 2017-10-20 ENCOUNTER — Encounter: Payer: Self-pay | Admitting: Physical Therapy

## 2017-10-20 DIAGNOSIS — M6281 Muscle weakness (generalized): Secondary | ICD-10-CM

## 2017-10-20 DIAGNOSIS — R296 Repeated falls: Secondary | ICD-10-CM | POA: Diagnosis not present

## 2017-10-20 DIAGNOSIS — R2681 Unsteadiness on feet: Secondary | ICD-10-CM

## 2017-10-20 DIAGNOSIS — R2689 Other abnormalities of gait and mobility: Secondary | ICD-10-CM

## 2017-10-20 NOTE — Therapy (Signed)
[facility] [address] [place], [place], [address] Phone: [phone]   Fax:  [phone]  Physical Therapy Treatment  Patient Details  Name: [name] MRN: [id number] Date of Birth: 12/27/1933 Referring Provider: Simonne Maffucci, MD   Encounter Date: 10/20/2017  PT End of Session - 10/20/17 0858    Visit Number  3    Number of Visits  9    Date for PT Re-Evaluation  11/28/17    Authorization Type  [employer] MCR    PT Start Time  [phone] therapist running late    PT Stop Time  0930    PT Time Calculation (min)  37 min    Equipment Utilized During Treatment  Gait belt    Activity Tolerance  Patient tolerated treatment well    Behavior During Therapy  [facility] for tasks assessed/performed       Past Medical History:  Diagnosis Date  . BPH (benign prostatic hyperplasia)   . Coronary artery ectasia    Mild CAD with normal systolic function per cath in August of 2011  . Diaphragmatic paralysis    felt to be partially responsible for dyspnea  . Dyspnea    due to paralyzed diaphragm and pulmonary issues  . Gout   . HTN (hypertension)   . Hypercholesteremia   . Microhematuria   . Obesity   . OSA (obstructive sleep apnea)   . Paroxysmal atrial fibrillation (HCC)    occured in the setting of acute E Coli sepsis and ileius 7/12  . PVC (premature ventricular contraction)    s/p PVC ablation 05/29/2010    Past Surgical History:  Procedure Laterality Date  . APPENDECTOMY  1978  . CARDIAC CATHETERIZATION  04-30-2010   Left main coronary artery is normal.   . CARDIOVASCULAR STRESS TEST  03-19-2010   0%  . CHOLECYSTECTOMY  1990  . poly removed from nose as a child    . PVC ablation  05/29/2010  . US ECHOCARDIOGRAPHY  03-09-2010   EF 55-60%    There were no vitals filed for this visit.  Subjective Assessment - 10/20/17 0857    Subjective  No new complaints. No falls or pain to report.     Limitations  House hold  activities;Walking    Patient Stated Goals  "I want to improve my balance."     Currently in Pain?  No/denies         [facility] Adult PT Treatment/Exercise - 10/20/17 0922      Transfers   Transfers  Sit to Stand;Stand to Sit    Sit to Stand  5: Supervision    Stand to Sit  5: Supervision    Number of Reps  10 reps;1 set    Comments  feet across the red beam: cues for full/upright posture, weight shifting and slow/controlled sitting down.           Balance Exercises - 10/20/17 0915      Balance Exercises: Standing   Standing Eyes Closed  Narrow base of support (BOS);Wide (BOA);Foam/compliant surface;30 secs;Limitations    SLS  Foam/compliant surface;Other reps (comment);Limitations    Rockerboard  Anterior/posterior;Lateral;Head turns;EO;EC;30 seconds;10 reps    Balance Beam  standing across blue foam beam: alternating fwd stepping to floor/back onto beam, then alternating bwd stepping to floor/back onto beam. cues for step length, step height on return and weight shifting. min guard to min assist with occasional touch to bars for balance.  Balance Exercises: Standing   Standing Eyes Closed Limitations  standing on blue airex no UE support: wide base progressing to narrow base of support for EC no head movements. no UE support with min guard/min assist needed for balance.     SLS Limitations  2 cones on blue mat: alterating fwd toe taps. then alternating cross toe taps. cues for stance position, weight shifting, and to slow down for improved balance. min guard to min assist needed with no UE support.     Rebounder Limitations  performed both ways on balance board: EO rocking board with emphasis on tall posture; holding board steady for EC no head movements, progressing to EC head movements left<>right and up<>down. min to mod assist with no UE support. cues on posture and weight shifitng to assist with balance.           PT Short Term Goals - 10/06/17 1254      PT SHORT TERM  GOAL #1   Title  Pt will initiate HEP in order to indicate improved functional mobility and decreased fall risk.  (Target Date: 10/29/17)    Time  4    Period  Weeks    Status  New      PT SHORT TERM GOAL #2   Title  Pt will perform 5TSS in </=15 secs without UE support in order to indicate improved functional strength.      Time  4    Period  Weeks    Status  New      PT SHORT TERM GOAL #3   Title  Will assess 6MWT and improve distance by 53' in order to indicate improved functional endurance.     Baseline  Baseline 784' w/ SPC and single standing rest break 10/06/17    Time  4    Period  Weeks    Status  New      PT SHORT TERM GOAL #4   Title  Pt will improve BERG balance score to 41/56 in order to indicate decreased fall risk.      Time  4    Period  Weeks    Status  New      PT SHORT TERM GOAL #5   Title  Pt will ambulate 250' w/ LRAD over unlevel outdoor surfaces including curb/ramp at mod I level in order to indicate safe return to community mobility.      Time  4    Period  Weeks    Status  New        PT Long Term Goals - 09/29/17 1309      PT LONG TERM GOAL #1   Title  Pt will be independent with HEP in order to indicate improved functional mobility and decreased fall risk.  (Target Date: 11/28/17)    Time  8    Period  Weeks    Status  New      PT LONG TERM GOAL #2   Title  Pt will improve BERG balance score to 44/56 in order to indicate decreased fall risk.      Time  8    Period  Weeks    Status  New      PT LONG TERM GOAL #3   Title  Pt will improve 6MWT by 150' from baseline in order to indicate improved functional endurance.      Time  8    Period  Weeks    Status  New  PT LONG TERM GOAL #4   Title  Pt will ambulate 150' over indoor surfaces without cane in order to indicate improved household mobility.      Time  8    Period  Weeks    Status  New      PT LONG TERM GOAL #5   Title  Pt will ambulate with gait speed >/=2.62 ft/sec w/ LRAD in  order to indicate pt is safe community ambulator.      Time  8    Period  Weeks    Status  New          Plan - 10/20/17 0914    Clinical Impression Statement  Today's skilled session continued to address balance and LE strengthening. Pt is making steady progress toward goals and should benefit from continued PT to progress toward unmet goals.     Rehab Potential  Good    Clinical Impairments Affecting Rehab Potential  co-morbidities    PT Frequency  1x / week would recommend 2x/wk however he has high copay    PT Duration  8 weeks    PT Treatment/Interventions  ADLs/Self Care Home Management;DME Instruction;Gait training;Stair training;Functional mobility training;Therapeutic activities;Therapeutic exercise;Balance training;Neuromuscular re-education;Cognitive remediation;Patient/family education;Orthotic Fit/Training;Passive range of motion;Energy conservation;Vestibular    PT Next Visit Plan  Continue with BLE strength and balance,  keep a check on his R foot esp when fatigued-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    Consulted and Agree with Plan of Care  Patient       Patient will benefit from skilled therapeutic intervention in order to improve the following deficits and impairments:  Cardiopulmonary status limiting activity, Decreased activity tolerance, Decreased balance, Decreased endurance, Decreased knowledge of use of DME, Decreased mobility, Decreased strength, Difficulty walking, Impaired flexibility, Impaired perceived functional ability, Postural dysfunction  Visit Diagnosis: Unsteadiness on feet  Other abnormalities of gait and mobility  Muscle weakness (generalized)     Problem List Patient Active Problem List   Diagnosis Date Noted  . Physical deconditioning 08/11/2017  . Shortness of breath 10/24/2014  . COPD (chronic obstructive pulmonary disease) with emphysema ([place]) 09/01/2014  . Fatigue  10/14/2011  . Atrial fibrillation ([place]) 05/15/2011  . Premature ventricular contractions (PVCs) (V[facility]) 02/25/2011  . 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  . PERSISTENT DISORDER INITIATING/MAINTAINING SLEEP 05/10/2010  . HYPERCHOLESTEROLEMIA 05/09/2010  . GOUT 05/09/2010  . OBESITY 05/09/2010  . Obstructive sleep apnea 05/09/2010  . HYPERTENSION 05/09/2010  . VENTRICULAR TACHYCARDIA 05/09/2010    Willow Ora, PTA, [facility] [address], [place], [place] [address] [phone] 10/20/17, 4:52 PM   Name: [name] MRN: [id number] Date of Birth: October 04, 1933

## 2017-10-27 ENCOUNTER — Ambulatory Visit: Payer: Medicare Other | Admitting: Rehabilitation

## 2017-10-27 ENCOUNTER — Encounter: Payer: Self-pay | Admitting: Rehabilitation

## 2017-10-27 DIAGNOSIS — R296 Repeated falls: Secondary | ICD-10-CM | POA: Diagnosis not present

## 2017-10-27 DIAGNOSIS — R2689 Other abnormalities of gait and mobility: Secondary | ICD-10-CM

## 2017-10-27 DIAGNOSIS — R2681 Unsteadiness on feet: Secondary | ICD-10-CM

## 2017-10-27 DIAGNOSIS — M6281 Muscle weakness (generalized): Secondary | ICD-10-CM

## 2017-10-27 NOTE — Therapy (Signed)
[facility] [address] [place], [place], [address] Phone: [phone]   Fax:  [phone]  Physical Therapy Treatment  Patient Details  Name: [name] MRN: [id number] Date of Birth: 1934-01-18 Referring Provider: Simonne Maffucci, MD   Encounter Date: 10/27/2017  PT End of Session - 10/27/17 0850    Visit Number  4    Number of Visits  9    Date for PT Re-Evaluation  11/28/17    Authorization Type  [employer] MCR    PT Start Time  0846    PT Stop Time  0929    PT Time Calculation (min)  43 min    Equipment Utilized During Treatment  Gait belt    Activity Tolerance  Patient tolerated treatment well    Behavior During Therapy  [facility] for tasks assessed/performed       Past Medical History:  Diagnosis Date  . BPH (benign prostatic hyperplasia)   . Coronary artery ectasia    Mild CAD with normal systolic function per cath in August of 2011  . Diaphragmatic paralysis    felt to be partially responsible for dyspnea  . Dyspnea    due to paralyzed diaphragm and pulmonary issues  . Gout   . HTN (hypertension)   . Hypercholesteremia   . Microhematuria   . Obesity   . OSA (obstructive sleep apnea)   . Paroxysmal atrial fibrillation (HCC)    occured in the setting of acute E Coli sepsis and ileius 7/12  . PVC (premature ventricular contraction)    s/p PVC ablation 05/29/2010    Past Surgical History:  Procedure Laterality Date  . APPENDECTOMY  1978  . CARDIAC CATHETERIZATION  04-30-2010   Left main coronary artery is normal.   . CARDIOVASCULAR STRESS TEST  03-19-2010   0%  . CHOLECYSTECTOMY  1990  . poly removed from nose as a child    . PVC ablation  05/29/2010  . US ECHOCARDIOGRAPHY  03-09-2010   EF 55-60%    There were no vitals filed for this visit.  Subjective Assessment - 10/27/17 0849    Subjective  Nothing new to report, no pain.      Limitations  House hold activities;Walking    Patient Stated Goals   "I want to improve my balance."     Currently in Pain?  No/denies                      [facility] Adult PT Treatment/Exercise - 10/27/17 0859      Standardized Balance Assessment   Standardized Balance Assessment  Berg Balance Test      Berg Balance Test   Sit to Stand  Able to stand without using hands and stabilize independently    Standing Unsupported  Able to stand safely 2 minutes    Sitting with Back Unsupported but Feet Supported on Floor or Stool  Able to sit safely and securely 2 minutes    Stand to Sit  Sits safely with minimal use of hands    Transfers  Able to transfer safely, minor use of hands    Standing Unsupported with Eyes Closed  Able to stand 10 seconds safely    Standing Ubsupported with Feet Together  Able to place feet together independently and stand 1 minute safely    From Standing, Reach Forward with Outstretched Arm  Can reach forward >12 cm safely (5")    From Standing Position, Pick up Object from  Floor  Able to pick up shoe safely and easily    From Standing Position, Turn to Look Behind Over each Shoulder  Looks behind from both sides and weight shifts well    Turn 360 Degrees  Able to turn 360 degrees safely but slowly    Standing Unsupported, Alternately Place Feet on Step/Stool  Able to complete >2 steps/needs minimal assist Due to difficulty elevating RLE to step    Standing Unsupported, One Foot in Front  Able to plae foot ahead of the other independently and hold 30 seconds standing on L foot    Standing on One Leg  Able to lift leg independently and hold equal to or more than 3 seconds    Total Score  47         Therex and NMR:  Went over current HEP and updated as needed, see pt instruction.    See goal section for 5TSS times.      PT Education - 10/27/17 0850    Education provided  Yes    Education Details  updated HEP    Person(s) Educated  Patient    Methods  Explanation;Demonstration;Handout    Comprehension  Verbalized  understanding;Returned demonstration       PT Short Term Goals - 10/27/17 0850      PT SHORT TERM GOAL #1   Title  Pt will initiate HEP in order to indicate improved functional mobility and decreased fall risk.  (Target Date: 10/29/17)    Baseline  met 10/27/17    Time  4    Period  Weeks    Status  Achieved      PT SHORT TERM GOAL #2   Title  Pt will perform 5TSS in </=15 secs without UE support in order to indicate improved functional strength.      Baseline  15.06 secs with single UE support, 24.87 (first trial, 31.78 secs 2nd trial)with support on legs    Time  4    Period  Weeks    Status  Not Met      PT SHORT TERM GOAL #3   Title  Will assess 6MWT and improve distance by 20' in order to indicate improved functional endurance.     Baseline  Baseline 784' w/ SPC and single standing rest break 10/06/17    Time  4    Period  Weeks    Status  New      PT SHORT TERM GOAL #4   Title  Pt will improve BERG balance score to 41/56 in order to indicate decreased fall risk.      Baseline  47/56 on 10/27/17    Time  4    Period  Weeks    Status  Achieved      PT SHORT TERM GOAL #5   Title  Pt will ambulate 250' w/ LRAD over unlevel outdoor surfaces including curb/ramp at mod I level in order to indicate safe return to community mobility.      Time  4    Period  Weeks    Status  New        PT Long Term Goals - 10/27/17 0911      PT LONG TERM GOAL #1   Title  Pt will be independent with HEP in order to indicate improved functional mobility and decreased fall risk.  (Target Date: 11/28/17)    Time  8    Period  Weeks    Status  New  PT LONG TERM GOAL #2   Title  Pt will improve BERG balance score to 50/56 in order to indicate decreased fall risk.      Baseline  updated due to progress    Time  8    Period  Weeks    Status  Revised      PT LONG TERM GOAL #3   Title  Pt will improve 6MWT by 150' from baseline in order to indicate improved functional endurance.      Time   8    Period  Weeks    Status  New      PT LONG TERM GOAL #4   Title  Pt will ambulate 150' over indoor surfaces without cane in order to indicate improved household mobility.      Time  8    Period  Weeks    Status  New      PT LONG TERM GOAL #5   Title  Pt will ambulate with gait speed >/=2.62 ft/sec w/ LRAD in order to indicate pt is safe community ambulator.      Time  8    Period  Weeks    Status  New            Plan - 10/27/17 0850    Clinical Impression Statement  Skilled session focused on going over STGs.  Pt has met 2/5 STGs, not meeting 5TSS goal due to continued LE weakness and need for UE support.  Did not have time to assess remaining goals but will check them on next visit.      Rehab Potential  Good    Clinical Impairments Affecting Rehab Potential  co-morbidities    PT Frequency  1x / week would recommend 2x/wk however he has high copay    PT Duration  8 weeks    PT Treatment/Interventions  ADLs/Self Care Home Management;DME Instruction;Gait training;Stair training;Functional mobility training;Therapeutic activities;Therapeutic exercise;Balance training;Neuromuscular re-education;Cognitive remediation;Patient/family education;Orthotic Fit/Training;Passive range of motion;Energy conservation;Vestibular    PT Next Visit Plan  Check remaining STGs. Continue with BLE strength and balance,  keep a check on his R foot esp when fatigued-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    Consulted and Agree with Plan of Care  Patient       Patient will benefit from skilled therapeutic intervention in order to improve the following deficits and impairments:  Cardiopulmonary status limiting activity, Decreased activity tolerance, Decreased balance, Decreased endurance, Decreased knowledge of use of DME, Decreased mobility, Decreased strength, Difficulty walking, Impaired flexibility, Impaired perceived functional ability,  Postural dysfunction  Visit Diagnosis: Unsteadiness on feet  Other abnormalities of gait and mobility  Muscle weakness (generalized)  Repeated falls     Problem List Patient Active Problem List   Diagnosis Date Noted  . Physical deconditioning 08/11/2017  . Shortness of breath 10/24/2014  . COPD (chronic obstructive pulmonary disease) with emphysema ([place]) 09/01/2014  . Fatigue 10/14/2011  . Atrial fibrillation ([place]) 05/15/2011  . Premature ventricular contractions (PVCs) (VPCs) 02/25/2011  . 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  . PERSISTENT DISORDER INITIATING/MAINTAINING SLEEP 05/10/2010  . HYPERCHOLESTEROLEMIA 05/09/2010  . GOUT 05/09/2010  . OBESITY 05/09/2010  . Obstructive sleep apnea 05/09/2010  . HYPERTENSION 05/09/2010  . VENTRICULAR TACHYCARDIA 05/09/2010    Cameron Sprang, PT, MPT [facility] [address] [place], [place], [address] Phone: [phone]   Fax:  [phone] 10/27/17, 9:32 AM  Name: CLEARNCE LEJA MRN: [id number] Date of Birth: [birth year]

## 2017-10-27 NOTE — Patient Instructions (Addendum)
Sit to Stand: Head Upright    With head upright, stand up slowly with eyes open.  Start with your hands on your lap and work towards doing with no hands. If you use a loose chair, put it against the wall so it won't slide away.  Repeat __10__ times per session. Do __2__ sessions per day.  Copyright  VHI. All rights reserved.    Standing Hip Extension    Bring leg back as far as possible. Use __2__ lbs on ankle. Repeat with other leg. Try your ankle weights, if you can do 8-10 reps without losing your form, then that's ok, but if its too difficult, remove the weights and do 10-15 reps instead.   Repeat __10__ times. Do _2___ sessions per day.  http://gt2.exer.us/395   Copyright  VHI. All rights reserved.   HIP: Abduction - Standing    Squeeze glutes. Raise leg out and slightly back.  Use 2lbs ankle weight.  _10__ reps per set, __2_ sets per day, __5-7_ days per week Hold onto a support.  Copyright  VHI. All rights reserved.   "I love a Parade" Lift    Using a chair or countertop if necessary, march in place as high as you can.  Do this slowly and use as light of support as you can to challenge your balance.  Repeat _10___ times on each side. Do __2__ sessions per day.  http://gt2.exer.us/344   Copyright  VHI. All rights reserved.    Stand in corner and have a loose chair in front of you for support.    Feet Together (Compliant Surface) Head Motion - Eyes Open    With eyes open, standing on compliant surface: ___pillow (can keep shoes on or take off or can put a towel over pillow)_____, feet together, move head slowly: up and down x 10 reps, and side to side x 10 reps.  Repeat __1__ times per session. Do __1-2__ sessions per day.  Copyright  VHI. All rights reserved.   Feet Together (Compliant Surface) Arm Motion - Eyes Closed    Stand on compliant surface: ___pillow_____ with feet together. Close eyes and keep arms by your side.  Repeat _3___  times per session for 15-20 secs each. Do __2__ sessions per day.  Copyright  VHI. All rights reserved.    Copyright  VHI. All rights reserved.   Tandem Walking    Do this along the counter top at home.  Walk with each foot directly in front of other, heel of one foot touching toes of other foot with each step. Both feet straight ahead. Do 3 laps down and back.  Do 1-2 times per day.    Copyright  VHI. All rights reserved.   FUNCTIONAL MOBILITY: Toe Walking    Walk forward on toes. _3__ reps per set, __1-2_ sets per day, __5-7_ days per week Use counter top for support.  Again, do 3 laps down and back.    Copyright  VHI. All rights reserved.

## 2017-11-03 ENCOUNTER — Encounter: Payer: Self-pay | Admitting: Physical Therapy

## 2017-11-03 ENCOUNTER — Ambulatory Visit: Payer: Medicare Other | Admitting: Physical Therapy

## 2017-11-03 DIAGNOSIS — R2689 Other abnormalities of gait and mobility: Secondary | ICD-10-CM

## 2017-11-03 DIAGNOSIS — R296 Repeated falls: Secondary | ICD-10-CM

## 2017-11-03 DIAGNOSIS — M6281 Muscle weakness (generalized): Secondary | ICD-10-CM

## 2017-11-03 DIAGNOSIS — R2681 Unsteadiness on feet: Secondary | ICD-10-CM

## 2017-11-03 NOTE — Therapy (Signed)
[facility] [address] [place], [place], [address] Phone: [phone]   Fax:  [phone]  Physical Therapy Treatment  Patient Details  Name: [name] MRN: [id number] Date of Birth: 1933-11-03 Referring Provider: Simonne Maffucci, MD   Encounter Date: 11/03/2017  PT End of Session - 11/03/17 0856    Visit Number  5    Number of Visits  9    Date for PT Re-Evaluation  11/28/17    Authorization Type  UHC MCR    PT Start Time  0847    PT Stop Time  0930    PT Time Calculation (min)  43 min    Equipment Utilized During Treatment  Gait belt    Activity Tolerance  Patient tolerated treatment well    Behavior During Therapy  [facility] for tasks assessed/performed       Past Medical History:  Diagnosis Date  . BPH (benign prostatic hyperplasia)   . Coronary artery ectasia    Mild CAD with normal systolic function per cath in August of 2011  . Diaphragmatic paralysis    felt to be partially responsible for dyspnea  . Dyspnea    due to paralyzed diaphragm and pulmonary issues  . Gout   . HTN (hypertension)   . Hypercholesteremia   . Microhematuria   . Obesity   . OSA (obstructive sleep apnea)   . Paroxysmal atrial fibrillation (HCC)    occured in the setting of acute E Coli sepsis and ileius 7/12  . PVC (premature ventricular contraction)    s/p PVC ablation 05/29/2010    Past Surgical History:  Procedure Laterality Date  . APPENDECTOMY  1978  . CARDIAC CATHETERIZATION  04-30-2010   Left main coronary artery is normal.   . CARDIOVASCULAR STRESS TEST  03-19-2010   0%  . CHOLECYSTECTOMY  1990  . poly removed from nose as a child    . PVC ablation  05/29/2010  . US ECHOCARDIOGRAPHY  03-09-2010   EF 55-60%    There were no vitals filed for this visit.  Subjective Assessment - 11/03/17 0850    Subjective  Had a fall on Saturday, tripped over right foot. Landed on left knee. Had a wound on knee from a previous  injection at MD office, the fall removed the scab and opened it up again. Was not using a cane and was able to get him self up. His spouse and grandson were there also if needed for assistance. Pt noted to have a bruise on the knee. Denies any pain at this time.                           Limitations  House hold activities;Walking    Patient Stated Goals  "I want to improve my balance."     Currently in Pain?  No/denies         [facility] PT Assessment - 11/03/17 0902      6 Minute Walk- Baseline   6 Minute Walk- Baseline  yes    BP (mmHg)  122/68    HR (bpm)  52    02 Sat (%RA)  95 %    Modified Borg Scale for Dyspnea  0- Nothing at all    Perceived Rate of Exertion (Borg)  6-      6 Minute walk- Post Test   6 Minute Walk Post Test  yes    BP (mmHg)  139/58  HR (bpm)  66    02 Sat (%RA)  85 %    Modified Borg Scale for Dyspnea  2- Mild shortness of breath    Perceived Rate of Exertion (Borg)  9- very light      6 minute walk test results    Aerobic Endurance Distance Walked  726    Endurance additional comments  with straigth cane, no rest breaks taken. used foot up brace to right foot           [facility] Adult PT Treatment/Exercise - 11/03/17 0914      Transfers   Transfers  Sit to Stand;Stand to Sit    Sit to Stand  5: Supervision;With upper extremity assist;From bed;From chair/3-in-1    Stand to Sit  5: Supervision;4: Min assist;With upper extremity assist;To bed;To chair/3-in-1    Stand to Sit Details  mostly supervision, however min assist was needed after 6 minute walk when going to sit on mat table due to ? balance loss/stumble to prevent fall forward.       Ambulation/Gait   Ambulation/Gait  Yes    Ambulation/Gait Assistance  4: Min guard    Ambulation/Gait Assistance Details  with use of foot up brace with gait. continued cues needed for step length and increased DF with swing phase.     Ambulation Distance (Feet)  310 Feet    Assistive device  Straight cane    Gait  Pattern  Step-through pattern;Decreased stride length;Decreased dorsiflexion - right;Trendelenburg;Lateral hip instability;Wide base of support;Trunk flexed    Ambulation Surface  Level;Unlevel;Indoor;Outdoor;Paved    Ramp  Other (comment) min guard assist    Ramp Details (indicate cue type and reason)  on outdoor inclines/declines with cane/foot up brace to right foot. cues on step length and increased foot clearance when ascending     Curb  Other (comment);4: Min assist min guard assit    Curb Details (indicate cue type and reason)  outdoor curb with cane/foot up brace on., no cues needed, min guard assist for safety with descending, min assist needed to ascend          PT Education - 11/03/17 0949    Education provided  Yes    Education Details  progress toward STG; ordering information on foot up brace    Methods  Explanation;Demonstration;Verbal cues;Handout    Comprehension  Verbalized understanding;Returned demonstration;Need further instruction;Verbal cues required       PT Short Term Goals - 10/27/17 0850      PT SHORT TERM GOAL #1   Title  Pt will initiate HEP in order to indicate improved functional mobility and decreased fall risk.  (Target Date: 10/29/17)    Baseline  met 10/27/17    Time  4    Period  Weeks    Status  Achieved      PT SHORT TERM GOAL #2   Title  Pt will perform 5TSS in </=15 secs without UE support in order to indicate improved functional strength.      Baseline  15.06 secs with single UE support, 24.87 (first trial, 31.78 secs 2nd trial)with support on legs    Time  4    Period  Weeks    Status  Not Met      PT SHORT TERM GOAL #3   Title  Will assess 6MWT and improve distance by 48' in order to indicate improved functional endurance.     Baseline  Baseline 784' w/ SPC and single standing rest break 10/06/17  Time  4    Period  Weeks    Status  New      PT SHORT TERM GOAL #4   Title  Pt will improve BERG balance score to 41/56 in order to  indicate decreased fall risk.      Baseline  47/56 on 10/27/17    Time  4    Period  Weeks    Status  Achieved      PT SHORT TERM GOAL #5   Title  Pt will ambulate 250' w/ LRAD over unlevel outdoor surfaces including curb/ramp at mod I level in order to indicate safe return to community mobility.      Time  4    Period  Weeks    Status  New        PT Long Term Goals - 10/27/17 0911      PT LONG TERM GOAL #1   Title  Pt will be independent with HEP in order to indicate improved functional mobility and decreased fall risk.  (Target Date: 11/28/17)    Time  8    Period  Weeks    Status  New      PT LONG TERM GOAL #2   Title  Pt will improve BERG balance score to 50/56 in order to indicate decreased fall risk.      Baseline  updated due to progress    Time  8    Period  Weeks    Status  Revised      PT LONG TERM GOAL #3   Title  Pt will improve 6MWT by 150' from baseline in order to indicate improved functional endurance.      Time  8    Period  Weeks    Status  New      PT LONG TERM GOAL #4   Title  Pt will ambulate 150' over indoor surfaces without cane in order to indicate improved household mobility.      Time  8    Period  Weeks    Status  New      PT LONG TERM GOAL #5   Title  Pt will ambulate with gait speed >/=2.62 ft/sec w/ LRAD in order to indicate pt is safe community ambulator.      Time  8    Period  Weeks    Status  New         Plan - 11/03/17 0856    Clinical Impression Statement  Pt's left knee was assess by Guido Sander, PT prior to start of session due to fall with landing on knee. Mild edema noted, no pain or other issues noted. Today's skilled session focused on checking remaining STGs with 1 goal met, 1 goal not met. Pt met the outdoor gait goal, including ramp/curb. Did not met the 6 minute walk goal for distance, however was able to complete the time without rest breaks and with decreased shortness of breath/work exertion needed. Today's session  also used the foot up brace for right foot DF assist with no toe scuffing noted in session, even on outdoor uneven surfaces. Pt is not 100% committed to the brace, however went ahead and provided ordering information from [employer]. Pt is progressing toward goals and should benefit from continued PT to progress toward unmet goals.     Rehab Potential  Good    Clinical Impairments Affecting Rehab Potential  co-morbidities    PT Frequency  1x / week would recommend 2x/wk however  he has high copay    PT Duration  8 weeks    PT Treatment/Interventions  ADLs/Self Care Home Management;DME Instruction;Gait training;Stair training;Functional mobility training;Therapeutic activities;Therapeutic exercise;Balance training;Neuromuscular re-education;Cognitive remediation;Patient/family education;Orthotic Fit/Training;Passive range of motion;Energy conservation;Vestibular    PT Next Visit Plan  continue to work on [place], continue with use of foot up brace- work on pushing to fatigue to see if brace is enough or if stronger brace is needed.    Consulted and Agree with Plan of Care  Patient       Patient will benefit from skilled therapeutic intervention in order to improve the following deficits and impairments:  Cardiopulmonary status limiting activity, Decreased activity tolerance, Decreased balance, Decreased endurance, Decreased knowledge of use of DME, Decreased mobility, Decreased strength, Difficulty walking, Impaired flexibility, Impaired perceived functional ability, Postural dysfunction  Visit Diagnosis: Unsteadiness on feet  Other abnormalities of gait and mobility  Muscle weakness (generalized)  Repeated falls     Problem List Patient Active Problem List   Diagnosis Date Noted  . Physical deconditioning 08/11/2017  . Shortness of breath 10/24/2014  . COPD (chronic obstructive pulmonary disease) with emphysema ([place]) 09/01/2014  . Fatigue 10/14/2011  . Atrial fibrillation  ([place]) 05/15/2011  . Premature ventricular contractions (PVCs) (VPCs) 02/25/2011  . 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  . PERSISTENT DISORDER INITIATING/MAINTAINING SLEEP 05/10/2010  . HYPERCHOLESTEROLEMIA 05/09/2010  . GOUT 05/09/2010  . OBESITY 05/09/2010  . Obstructive sleep apnea 05/09/2010  . HYPERTENSION 05/09/2010  . VENTRICULAR TACHYCARDIA 05/09/2010    Willow Ora, PTA, [facility] [address] [place], [place] [address] [phone] 11/03/17, 9:56 AM   Name: [name] MRN: [id number] Date of Birth: May 10, 1934

## 2017-11-05 ENCOUNTER — Ambulatory Visit (INDEPENDENT_AMBULATORY_CARE_PROVIDER_SITE_OTHER): Payer: Medicare Other | Admitting: Nurse Practitioner

## 2017-11-05 ENCOUNTER — Encounter: Payer: Self-pay | Admitting: Nurse Practitioner

## 2017-11-05 VITALS — BP 106/66 | HR 51 | Ht 70.0 in | Wt 220.8 lb

## 2017-11-05 DIAGNOSIS — R0602 Shortness of breath: Secondary | ICD-10-CM

## 2017-11-05 DIAGNOSIS — I259 Chronic ischemic heart disease, unspecified: Secondary | ICD-10-CM | POA: Diagnosis not present

## 2017-11-05 DIAGNOSIS — I493 Ventricular premature depolarization: Secondary | ICD-10-CM | POA: Diagnosis not present

## 2017-11-05 DIAGNOSIS — I10 Essential (primary) hypertension: Secondary | ICD-10-CM | POA: Diagnosis not present

## 2017-11-05 DIAGNOSIS — Z79899 Other long term (current) drug therapy: Secondary | ICD-10-CM

## 2017-11-05 LAB — BASIC METABOLIC PANEL
BUN/Creatinine Ratio: 43 — ABNORMAL HIGH (ref 10–24)
BUN: 47 mg/dL — ABNORMAL HIGH (ref 8–27)
CO2: 21 mmol/L (ref 20–29)
Calcium: 9.1 mg/dL (ref 8.6–10.2)
Chloride: 107 mmol/L — ABNORMAL HIGH (ref 96–106)
Creatinine, Ser: 1.1 mg/dL (ref 0.76–1.27)
GFR calc Af Amer: 72 mL/min/{1.73_m2} (ref >59)
GFR calc non Af Amer: 63 mL/min/{1.73_m2} (ref >59)
Glucose: 98 mg/dL (ref 65–99)
Potassium: 5.1 mmol/L (ref 3.5–5.2)
Sodium: 142 mmol/L (ref 134–144)

## 2017-11-05 NOTE — Progress Notes (Signed)
CARDIOLOGY OFFICE NOTE  Date:  11/05/2017    [name] Date of Birth: 1934/01/25 Medical Record #[id number]  PCP:  [name], MD  Cardiologist:  [name]    Chief Complaint  Patient presents with  . Hypertension  . Irregular Heart Beat  . Hyperlipidemia    3 month check - seen for Dr. [name]    History of Present Illness: [name] is a 84 y.o. male who presents today for a 3 month check. Seen for Dr. [name]. He has seen Dr. [name] in the remote past.   He has a h/o PVC ablation 05/29/10, PAF, obesity, sedentary lifestyle, HLD, gout, OSA treated with CPAP, &dysfunctional diaphragm with chronic dyspneapreviouslyfollowed by Dr. [name]. He has mild CAD per cath back in August of 2011. Labs are checked by primary care.   Seen back in February of 2016 by Dr. [name] - this was for evaluation of dyspnea. Dr. [name] wanted cardiac status reevaluated to assess for any cardiac issues that may be contributing to dyspnea. Was referred for Myoview and echocardiogram. These studies were satisfactory. Encouraged to cut back on his alcohol intake as well.   I have followed him since - he has been holding his own - dyspnea is chronic.He has gotten back with pulmonary - now seeing Dr. [name].Balance issues and has had some falls. Rhythm has been stablefortunately. Seen back in the fall - BP was up - Aldactone was added. Was doing well at his last visit back in November.   Comes back today. Here alone.He is doing ok. Has gained some weight. Remains short of breath - says it is no worse. No chest pain. No real palpitations. BP looks great. Lipids from January from PCP noted. He is going to therapy for his balance and then plans to try and go to pulmonary rehab. 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 Overall, he feels like he is doing ok and has no real concerns today.   Past Medical History:  Diagnosis Date  . BPH (benign prostatic hyperplasia)   . Coronary  artery ectasia    Mild CAD with normal systolic function per cath in August of 2011  . Diaphragmatic paralysis    felt to be partially responsible for dyspnea  . Dyspnea    due to paralyzed diaphragm and pulmonary issues  . Gout   . HTN (hypertension)   . Hypercholesteremia   . Microhematuria   . Obesity   . OSA (obstructive sleep apnea)   . Paroxysmal atrial fibrillation (HCC)    occured in the setting of acute E Coli sepsis and ileius 7/12  . PVC (premature ventricular contraction)    s/p PVC ablation 05/29/2010    Past Surgical History:  Procedure Laterality Date  . APPENDECTOMY  1978  . CARDIAC CATHETERIZATION  04-30-2010   Left main coronary artery is normal.   . CARDIOVASCULAR STRESS TEST  03-19-2010   0%  . CHOLECYSTECTOMY  1990  . poly removed from nose as a child    . PVC ablation  05/29/2010  . US ECHOCARDIOGRAPHY  03-09-2010   EF 55-60%     Medications: Current Meds  Medication Sig  . allopurinol (ZYLOPRIM) 300 MG tablet Take 300 mg by mouth daily.    Marland Kitchen amLODipine (NORVASC) 5 MG tablet Take 5 mg by mouth daily.  Marland Kitchen aspirin 81 MG tablet Take 81 mg by mouth daily.    Marland Kitchen buPROPion (WELLBUTRIN SR) 100 MG 12 hr tablet Take  100 mg by mouth 2 (two) times daily.  Marland Kitchen dicyclomine (BENTYL) 20 MG tablet Take 20 mg by mouth 2 (two) times daily.   Marland Kitchen doxazosin (CARDURA) 4 MG tablet Take 1 tablet (4 mg total) by mouth daily.  Marland Kitchen ibuprofen (ADVIL,MOTRIN) 200 MG tablet Take 200-400 mg by mouth every 6 (six) hours as needed (body aches). For pain  . lisinopril (PRINIVIL,ZESTRIL) 40 MG tablet Take 40 mg by mouth daily.   Marland Kitchen lovastatin (MEVACOR) 40 MG tablet Take 40 mg by mouth at bedtime.    . metoprolol succinate (TOPROL-XL) 25 MG 24 hr tablet Take 25 mg by mouth 2 (two) times daily.   . sertraline (ZOLOFT) 100 MG tablet Take 100 mg by mouth daily.  . traZODone (DESYREL) 100 MG tablet Take 100 mg by mouth at bedtime.    Marland Kitchen umeclidinium-vilanterol (ANORO ELLIPTA) 62.5-25 MCG/INH AEPB  Inhale 1 puff daily into the lungs.  . [DISCONTINUED] albuterol (PROVENTIL) (5 MG/ML) 0.5% nebulizer solution Take 2.5 mg by nebulization every 6 (six) hours as needed for wheezing or shortness of breath.  . [DISCONTINUED] umeclidinium-vilanterol (ANORO ELLIPTA) 62.5-25 MCG/INH AEPB Inhale 1 puff into the lungs daily.     Allergies: No Known Allergies  Social History: The patient  reports that he quit smoking about 33 years ago. His smoking use included cigarettes. He has a 60.00 pack-year smoking history. he has never used smokeless tobacco. He reports that he drinks alcohol. He reports that he does not use drugs.   Family History: The patient's family history includes Aneurysm (age of onset: 36) in his father; Breast cancer in his daughter and mother.   Review of Systems: Please see the history of present illness.   Otherwise, the review of systems is positive for none.   All other systems are reviewed and negative.   Physical Exam: VS:  BP 106/66 (BP Location: Left Arm, Patient Position: Sitting, Cuff Size: Normal)   Pulse (!) 51   Ht 5\' 10"  (1.778 m)   Wt 220 lb 12.8 oz (100.2 kg)   SpO2 98% Comment: at rest  BMI 31.68 kg/m  .  BMI Body mass index is 31.68 kg/m.  Wt Readings from Last 3 Encounters:  11/05/17 220 lb 12.8 oz (100.2 kg)  08/11/17 215 lb 6.4 oz (97.7 kg)  08/05/17 212 lb 12.8 oz (96.5 kg)    General: Pleasant. Obese. Alert and in no acute distress.   HEENT: Normal.  Neck: Supple, no JVD, carotid bruits, or masses noted.  Cardiac: Heart tones are quite distant. No edema.  Respiratory:  Lungs with decreased breath sounds but with normal work of breathing at rest.  GI: Soft and nontender.  MS: No deformity or atrophy. Gait and ROM intact.  Skin: Warm and dry. Color is normal.  Neuro:  Strength and sensation are intact and no gross focal deficits noted.  Psych: Alert, appropriate and with normal affect.   LABORATORY DATA:  EKG:  EKG is not ordered  today.  Lab Results  Component Value Date   WBC 5.6 10/14/2011   HGB 14.6 10/14/2011   HCT 42.8 10/14/2011   PLT 142.0 (L) 10/14/2011   GLUCOSE 103 (H) 08/05/2017   ALT 28 04/05/2011   AST 32 04/05/2011   NA 141 08/05/2017   K 4.3 08/05/2017   CL 103 08/05/2017   CREATININE 0.92 08/05/2017   BUN 35 (H) 08/05/2017   CO2 24 08/05/2017   TSH 1.66 10/14/2011   INR 1.26 04/07/2011  BNP (last 3 results) No results for input(s): BNP in the last 8760 hours.  ProBNP (last 3 results) No results for input(s): PROBNP in the last 8760 hours.   Other Studies Reviewed Today:  Myoview Impression from March 2016 Exercise Capacity: Wise with no exercise. BP Response: Normal blood pressure response. Clinical Symptoms: Nausea. ECG Impression: Atrial fibrillation, no changes from baseline.  Comparison with Prior Nuclear Study: No images to compare  Overall Impression: Low risk stress nuclear study a medium-sized basal inferior and basal to mid inferolateral perfusion defect that is actually worse at rest than with stress. No ischemia. Given normal wall motion, this may represent diaphragmatic attenuation. .  LV Ejection Fraction: 63%. LV Wall Motion: NL LV Function; NL Wall Motion   Loralie Champagne 11/07/2014   Echo Study Conclusions from February 2016  - Left ventricle: The cavity size was normal. Wall thickness was increased in a pattern of mild LVH. Systolic function was normal. The estimated ejection fraction was in the range of 60% to 65%. Wall motion was normal; there were no regional wall motion abnormalities. - Aortic valve: There was trivial regurgitation. - Left atrium: The atrium was mildly dilated. - Right atrium: The atrium was mildly dilated.  Assessment/Plan:  1. HTN - BP looks great - BMET today - no changes made today.   2. Chronic Dyspnea - appears unchanged. His cardiac studies from 2016 were stable. Back now seeing  pulmonary - he understands he needs to keep working on endurance and try to work on weight - unfortunately, he is back up 5 pounds.    3. PVCs - past ablation - no problems reported. Regular rhythm on exam today.  4. Obesity -always a struggle.   5. Depression - long standing issue- not discussed today but his demeanor seemed better to me today.   6. HLD - on statin - most recent lipids from last month noted.   7. CAD - managed medically - no active symptoms.  Current medicines are reviewed with the patient today.  The patient does not have concerns regarding medicines other than what has been noted above.  The following changes have been made:  See above.  Labs/ tests ordered today include:   No orders of the defined types were placed in this encounter.    Disposition:   FU with me in 4 months.   Patient is agreeable to this plan and will call if any problems develop in the interim.   SignedTruitt Merle, NP  11/05/2017 8:13 AM  [facility] [address] [place], [place]  [address] Phone: [phone] Fax: [phone]

## 2017-11-05 NOTE — Patient Instructions (Addendum)
We will be checking the following labs today - BMET   Medication Instructions:    Continue with your current medicines.     Testing/Procedures To Be Arranged:  N/A  Follow-Up:   See me in about 4 months.     Other Special Instructions:   N/A    If you need a refill on your cardiac medications before your next appointment, please call your pharmacy.   Call the [facility] office at [phone] if you have any questions, problems or concerns.      

## 2017-11-06 ENCOUNTER — Telehealth: Payer: Self-pay

## 2017-11-06 DIAGNOSIS — I4891 Unspecified atrial fibrillation: Secondary | ICD-10-CM

## 2017-11-06 NOTE — Telephone Encounter (Signed)
-----   Message from Burtis Junes, NP sent at 11/05/2017  3:46 PM EST ----- Ok to report. Labs are stable - but kidneys little more dry - needs to try and increase water - recheck BMET in 2 weeks and for now, would continue on current regimen.

## 2017-11-06 NOTE — Telephone Encounter (Signed)
Informed patient of results and verbal understanding expressed.  Encouraged increased water intake. BMET scheduled 3/18. Patient agrees with treatment plan.

## 2017-11-10 ENCOUNTER — Ambulatory Visit: Payer: Medicare Other | Attending: Pulmonary Disease | Admitting: Physical Therapy

## 2017-11-10 ENCOUNTER — Encounter: Payer: Self-pay | Admitting: Physical Therapy

## 2017-11-10 VITALS — BP 134/68 | HR 53

## 2017-11-10 DIAGNOSIS — R296 Repeated falls: Secondary | ICD-10-CM | POA: Diagnosis present

## 2017-11-10 DIAGNOSIS — R2681 Unsteadiness on feet: Secondary | ICD-10-CM | POA: Diagnosis not present

## 2017-11-10 DIAGNOSIS — R2689 Other abnormalities of gait and mobility: Secondary | ICD-10-CM | POA: Diagnosis present

## 2017-11-10 DIAGNOSIS — M6281 Muscle weakness (generalized): Secondary | ICD-10-CM | POA: Diagnosis present

## 2017-11-10 NOTE — Patient Instructions (Signed)
WALKING on the Treadmill  Walking is a great form of exercise to increase your strength, endurance and overall fitness.  A walking program can help you start slowly and gradually build endurance as you go.  Everyone's ability is different, so each person's starting point will be different.  You do not have to follow them exactly.  The are just samples. You should simply find out what's right for you and stick to that program.   In the beginning, you'll start off walking 2 times a day for short distances.  As you get stronger, you'll be walking further.  A. You Can Walk For A Certain Length Of Time Each Day    Walk 4 minutes 1-2 times per day. (Minute 1 warm up - slower speed, increase the speed x 2 minutes, Minute 4 cool down - slower speed)  Increase fast walking time by 1 minute every 3-4 days  Work up to 10-15 (2 times a day)   Example:   Day 1-4 4 minutes (2 minutes at fast speed) 1-2 times per day   Day 5-9 5 minutes (3 minutes at fast speed) 1-2 times per day   Day 10-14 6 minutes (4 minutes at fast speed) 1-2 times per day  Please only do the exercises that your therapist has initialed and dated   Balancing Act    In a standing position, go up on toes and down. Then back on heels. For balance, use support or put arms out in front. Repeat __12__ times. Do __2__ sessions per day.  FUNCTIONAL MOBILITY: Marching - Standing    March in place by lifting left leg up, then right. Alternate. _12__ reps per set, 2 sets per day.  Hold onto a support.   Knee Flexion (Hamstring Strength)    Stand with support, Breathe in. Breathing out through pursed lips, raise one heel up towards buttocks.  Return foot slowly down, breathing in.  Switch legs. Repeat 12_ times alternating legs. Do __2_ sessions per day.   Sit to Stand: Easy    Sit in a sturdy chair.  Cane in front for support. Leaning at hips, shift weight forward. Breathing IN, push through legs to stand.  Breathing OUT,  lower down slowly.  Repeat _6__ times, rest and then perform 6 more. Do __2_ sessions per day.   Tandem Walking at [employer] with each foot directly in front of other, heel of one foot touching toes of other foot with each step. Both feet straight ahead.  HOLD on to Counter top for support. Perform 4 laps down counter top.

## 2017-11-10 NOTE — Therapy (Signed)
[facility] [address] [place], [place], [address] Phone: [phone]   Fax:  [phone]  Physical Therapy Treatment  Patient Details  Name: [name] MRN: [id number] Date of Birth: 12/10/1933 Referring Provider: Simonne Maffucci, MD   Encounter Date: 11/10/2017  PT End of Session - 11/10/17 0927    Visit Number  6    Number of Visits  9    Date for PT Re-Evaluation  11/28/17    Authorization Type  [employer] MCR    PT Start Time  0836    PT Stop Time  0922    PT Time Calculation (min)  46 min    Activity Tolerance  Patient tolerated treatment well    Behavior During Therapy  [facility] for tasks assessed/performed       Past Medical History:  Diagnosis Date  . BPH (benign prostatic hyperplasia)   . Coronary artery ectasia    Mild CAD with normal systolic function per cath in August of 2011  . Diaphragmatic paralysis    felt to be partially responsible for dyspnea  . Dyspnea    due to paralyzed diaphragm and pulmonary issues  . Gout   . HTN (hypertension)   . Hypercholesteremia   . Microhematuria   . Obesity   . OSA (obstructive sleep apnea)   . Paroxysmal atrial fibrillation (HCC)    occured in the setting of acute E Coli sepsis and ileius 7/12  . PVC (premature ventricular contraction)    s/p PVC ablation 05/29/2010    Past Surgical History:  Procedure Laterality Date  . APPENDECTOMY  1978  . CARDIAC CATHETERIZATION  04-30-2010   Left main coronary artery is normal.   . CARDIOVASCULAR STRESS TEST  03-19-2010   0%  . CHOLECYSTECTOMY  1990  . poly removed from nose as a child    . PVC ablation  05/29/2010  . US ECHOCARDIOGRAPHY  03-09-2010   EF 55-60%    Vitals:   11/10/17 0841  BP: 134/68  Pulse: (!) 53  SpO2: 92%                     [facility] Adult PT Treatment/Exercise - 11/10/17 0924      Exercises   Exercises  Knee/Hip      Knee/Hip Exercises: Aerobic   Tread Mill  walking  program on Treadmill at 1.9 x 1 minute > 2.1 for 2 minutes > 1.6 x 1 minute for cool down with bilat UE support and no evidence of R foot drag.  Pt reported significant SOB but reported light intensity on RPE.      Following warm up on treadmill, reviewed, revised and added the following exercises to HEP:    WALKING on the Treadmill  Walking is a great form of exercise to increase your strength, endurance and overall fitness.  A walking program can help you start slowly and gradually build endurance as you go.  Everyone's ability is different, so each person's starting point will be different.  You do not have to follow them exactly.  The are just samples. You should simply find out what's right for you and stick to that program.   In the beginning, you'll start off walking 2 times a day for short distances.  As you get stronger, you'll be walking further.  A. You Can Walk For A Certain Length Of Time Each Day    Walk 4 minutes 1-2 times per day. (Minute 1 warm  up - slower speed, increase the speed x 2 minutes, Minute 4 cool down - slower speed)  Increase fast walking time by 1 minute every 3-4 days  Work up to 10-15 (2 times a day)   Example:   Day 1-4 4 minutes (2 minutes at fast speed) 1-2 times per day   Day 5-9 5 minutes (3 minutes at fast speed) 1-2 times per day   Day 10-14 6 minutes (4 minutes at fast speed) 1-2 times per day  Please only do the exercises that your therapist has initialed and dated   Balancing Act    In a standing position, go up on toes and down. Then back on heels. For balance, use support or put arms out in front. Repeat __12__ times. Do __2__ sessions per day.  FUNCTIONAL MOBILITY: Marching - Standing    March in place by lifting left leg up, then right. Alternate. _12__ reps per set, 2 sets per day.  Hold onto a support.   Knee Flexion (Hamstring Strength)    Stand with support, Breathe in. Breathing out through pursed lips, raise one heel up  towards buttocks.  Return foot slowly down, breathing in.  Switch legs. Repeat 12_ times alternating legs. Do __2_ sessions per day.   Sit to Stand: Easy    Sit in a sturdy chair.  Cane in front for support. Leaning at hips, shift weight forward. Breathing IN, push through legs to stand.  Breathing OUT, lower down slowly.  Repeat _6__ times, rest and then perform 6 more. Do __2_ sessions per day.   Tandem Walking at [employer] with each foot directly in front of other, heel of one foot touching toes of other foot with each step. Both feet straight ahead.  HOLD on to Counter top for support. Perform 4 laps down counter top.      PT Education - 11/10/17 [phone]    Education provided  Yes    Education Details  initiated HEP    Person(s) Educated  Patient    Methods  Explanation;Demonstration;Handout    Comprehension  Verbalized understanding;Need further instruction;Returned demonstration       PT Short Term Goals - 10/27/17 0850      PT SHORT TERM GOAL #1   Title  Pt will initiate HEP in order to indicate improved functional mobility and decreased fall risk.  (Target Date: 10/29/17)    Baseline  met 10/27/17    Time  4    Period  Weeks    Status  Achieved      PT SHORT TERM GOAL #2   Title  Pt will perform 5TSS in </=15 secs without UE support in order to indicate improved functional strength.      Baseline  15.06 secs with single UE support, 24.87 (first trial, 31.78 secs 2nd trial)with support on legs    Time  4    Period  Weeks    Status  Not Met      PT SHORT TERM GOAL #3   Title  Will assess 6MWT and improve distance by 70' in order to indicate improved functional endurance.     Baseline  Baseline 784' w/ SPC and single standing rest break 10/06/17    Time  4    Period  Weeks    Status  New      PT SHORT TERM GOAL #4   Title  Pt will improve BERG balance score to 41/56 in order to indicate  decreased fall risk.      Baseline  47/56 on 10/27/17    Time  4     Period  Weeks    Status  Achieved      PT SHORT TERM GOAL #5   Title  Pt will ambulate 250' w/ LRAD over unlevel outdoor surfaces including curb/ramp at mod I level in order to indicate safe return to community mobility.      Time  4    Period  Weeks    Status  New        PT Long Term Goals - 10/27/17 0911      PT LONG TERM GOAL #1   Title  Pt will be independent with HEP in order to indicate improved functional mobility and decreased fall risk.  (Target Date: 11/28/17)    Time  8    Period  Weeks    Status  New      PT LONG TERM GOAL #2   Title  Pt will improve BERG balance score to 50/56 in order to indicate decreased fall risk.      Baseline  updated due to progress    Time  8    Period  Weeks    Status  Revised      PT LONG TERM GOAL #3   Title  Pt will improve 6MWT by 150' from baseline in order to indicate improved functional endurance.      Time  8    Period  Weeks    Status  New      PT LONG TERM GOAL #4   Title  Pt will ambulate 150' over indoor surfaces without cane in order to indicate improved household mobility.      Time  8    Period  Weeks    Status  New      PT LONG TERM GOAL #5   Title  Pt will ambulate with gait speed >/=2.62 ft/sec w/ LRAD in order to indicate pt is safe community ambulator.      Time  8    Period  Weeks    Status  New            Plan - 11/10/17 6063    Clinical Impression Statement  Reviewed and adjusted patient's HEP focusing on walking program on Treadmill, ankle and hamstring strengthening and dynamic balance with more narrow BOS.  Pt tolerated well maintaining Sp02 >92% and HR > 55 bpm but did require 2-3 seated rest breaks and multiple verbal and visual cues for diaphragmatic breathing during exercises to avoid valsalva.  Will continue to progress towards LTG.    Rehab Potential  Good    Clinical Impairments Affecting Rehab Potential  co-morbidities    PT Frequency  1x / week would recommend 2x/wk however he has high  copay    PT Duration  8 weeks    PT Treatment/Interventions  ADLs/Self Care Home Management;DME Instruction;Gait training;Stair training;Functional mobility training;Therapeutic activities;Therapeutic exercise;Balance training;Neuromuscular re-education;Cognitive remediation;Patient/family education;Orthotic Fit/Training;Passive range of motion;Energy conservation;Vestibular    PT Next Visit Plan  Sorry! gave him new exercises because I didnt see the previous ones.  Condense HEP and continue to work on [place], continue with use of foot up brace- work on pushing to fatigue to see if brace is enough or if stronger brace is needed.    Consulted and Agree with Plan of Care  Patient       Patient will benefit from skilled therapeutic  intervention in order to improve the following deficits and impairments:  Cardiopulmonary status limiting activity, Decreased activity tolerance, Decreased balance, Decreased endurance, Decreased knowledge of use of DME, Decreased mobility, Decreased strength, Difficulty walking, Impaired flexibility, Impaired perceived functional ability, Postural dysfunction  Visit Diagnosis: Unsteadiness on feet  Other abnormalities of gait and mobility  Muscle weakness (generalized)  Repeated falls     Problem List Patient Active Problem List   Diagnosis Date Noted  . Physical deconditioning 08/11/2017  . Shortness of breath 10/24/2014  . COPD (chronic obstructive pulmonary disease) with emphysema ([place]) 09/01/2014  . Fatigue 10/14/2011  . Atrial fibrillation ([place]) 05/15/2011  . Premature ventricular contractions (PVCs) (VPCs) 02/25/2011  . 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  . PERSISTENT DISORDER INITIATING/MAINTAINING SLEEP 05/10/2010  . HYPERCHOLESTEROLEMIA 05/09/2010  . GOUT 05/09/2010  . OBESITY 05/09/2010  . Obstructive sleep apnea 05/09/2010  . HYPERTENSION 05/09/2010  . VENTRICULAR TACHYCARDIA 05/09/2010    Rico Junker, PT, DPT 11/11/17     8:55 AM    [facility] [address] [place], [place], [address] Phone: [phone]   Fax:  [phone]  Name: NHIA HEAPHY MRN: [id number] Date of Birth: Mar 30, 1934

## 2017-11-17 ENCOUNTER — Encounter: Payer: Self-pay | Admitting: Rehabilitation

## 2017-11-17 ENCOUNTER — Ambulatory Visit: Payer: Medicare Other | Admitting: Rehabilitation

## 2017-11-17 DIAGNOSIS — R2689 Other abnormalities of gait and mobility: Secondary | ICD-10-CM

## 2017-11-17 DIAGNOSIS — R2681 Unsteadiness on feet: Secondary | ICD-10-CM

## 2017-11-17 DIAGNOSIS — R296 Repeated falls: Secondary | ICD-10-CM

## 2017-11-17 DIAGNOSIS — M6281 Muscle weakness (generalized): Secondary | ICD-10-CM

## 2017-11-17 NOTE — Therapy (Signed)
[facility] [address] [place], [place], [address] Phone: [phone]   Fax:  [phone]  Physical Therapy Treatment  Patient Details  Name: [name] MRN: [id number] Date of Birth: Jul 30, 1934 Referring Provider: Simonne Maffucci, MD   Encounter Date: 11/17/2017  PT End of Session - 11/17/17 0927    Visit Number  7    Number of Visits  9    Date for PT Re-Evaluation  11/28/17    Authorization Type  [employer] MCR    PT Start Time  0846    PT Stop Time  0930    PT Time Calculation (min)  44 min    Activity Tolerance  Patient tolerated treatment well    Behavior During Therapy  [facility] for tasks assessed/performed       Past Medical History:  Diagnosis Date  . BPH (benign prostatic hyperplasia)   . Coronary artery ectasia    Mild CAD with normal systolic function per cath in August of 2011  . Diaphragmatic paralysis    felt to be partially responsible for dyspnea  . Dyspnea    due to paralyzed diaphragm and pulmonary issues  . Gout   . HTN (hypertension)   . Hypercholesteremia   . Microhematuria   . Obesity   . OSA (obstructive sleep apnea)   . Paroxysmal atrial fibrillation (HCC)    occured in the setting of acute E Coli sepsis and ileius 7/12  . PVC (premature ventricular contraction)    s/p PVC ablation 05/29/2010    Past Surgical History:  Procedure Laterality Date  . APPENDECTOMY  1978  . CARDIAC CATHETERIZATION  04-30-2010   Left main coronary artery is normal.   . CARDIOVASCULAR STRESS TEST  03-19-2010   0%  . CHOLECYSTECTOMY  1990  . poly removed from nose as a child    . PVC ablation  05/29/2010  . US ECHOCARDIOGRAPHY  03-09-2010   EF 55-60%    There were no vitals filed for this visit.  Subjective Assessment - 11/17/17 0848    Subjective  Pt reports doing well, no falls.     Limitations  House hold activities;Walking    Patient Stated Goals  "I want to improve my balance."     Currently in  Pain?  No/denies                      [facility] Adult PT Treatment/Exercise - 11/17/17 0902      Ambulation/Gait   Ambulation/Gait  Yes    Ambulation/Gait Assistance  5: Supervision    Ambulation/Gait Assistance Details  Trialed use of PLS AFO on RLE during session to better assess need for external DF assist during gait.  Pt ambulated x 300' with AFO.  Note that gait looked very fluid but with same gait speed.  Pt reports it feels as though he isn't in shoe completely and that it feels tight (it was a larger size AFO and he would likely need it trimmed).  Educated on differences between foot up brace and AFO.  Then had pt ambulate on treadmill to conitnue to work on improving endurance and to also continue to assess for R foot/toe catch.  Note no toe catch duing treadmill exercise, however pts O2 sats did drop to 87 %.  It took approx 1 min for pt to increase back to 90%.  Educated pt on increasing time on treadmill at home to 5 mins (still ramping up to 2  mph and the last minute ramping down for cool down).  Pt verbalized understanding.     Ambulation Distance (Feet)  300 Feet plus 23mns on treadmil    Assistive device  Straight cane and treadmill    Gait Pattern  Step-through pattern;Decreased stride length;Decreased dorsiflexion - right;Trendelenburg;Lateral hip instability;Wide base of support;Trunk flexed    Ambulation Surface  Level;Indoor      Self-Care   Self-Care  Other Self-Care Comments    Other Self-Care Comments   Discussed different bracing options.  Note that he reports foot up brace is expensive and seems unwilling to purchase at this time.  Also he states that he does not wear lace up tennis shoes when not in therapy.  Discussed that foot up brace could not be worn with loafers, however PLS AFO could but is not optimal for best support.  Pt to wear loafers to next session in order to better address issue.        Neuro Re-ed    Neuro Re-ed Details   In // bars standing  on blue foam balance beam perpendicularly maintaining balance x 2 sets of 20 secs.  Progressed to feet apart with alternating UE flex with opposite head turn x 10 reps with intermittent UE support needed from PT for balance.  Then performed tandem stance x 2 reps of 15 secs on each side with intermittent UE support needed.               PT Education - 11/17/17 0849    Education provided  Yes    Education Details  use/benefits of foot up brace vs AFO    Person(s) Educated  Patient    Methods  Explanation    Comprehension  Verbalized understanding       PT Short Term Goals - 10/27/17 0850      PT SHORT TERM GOAL #1   Title  Pt will initiate HEP in order to indicate improved functional mobility and decreased fall risk.  (Target Date: 10/29/17)    Baseline  met 10/27/17    Time  4    Period  Weeks    Status  Achieved      PT SHORT TERM GOAL #2   Title  Pt will perform 5TSS in </=15 secs without UE support in order to indicate improved functional strength.      Baseline  15.06 secs with single UE support, 24.87 (first trial, 31.78 secs 2nd trial)with support on legs    Time  4    Period  Weeks    Status  Not Met      PT SHORT TERM GOAL #3   Title  Will assess 6MWT and improve distance by 719 in order to indicate improved functional endurance.     Baseline  Baseline 784' w/ SPC and single standing rest break 10/06/17    Time  4    Period  Weeks    Status  New      PT SHORT TERM GOAL #4   Title  Pt will improve BERG balance score to 41/56 in order to indicate decreased fall risk.      Baseline  47/56 on 10/27/17    Time  4    Period  Weeks    Status  Achieved      PT SHORT TERM GOAL #5   Title  Pt will ambulate 250' w/ LRAD over unlevel outdoor surfaces including curb/ramp at mod I level in order to indicate safe return  to community mobility.      Time  4    Period  Weeks    Status  New        PT Long Term Goals - 10/27/17 0911      PT LONG TERM GOAL #1   Title  Pt  will be independent with HEP in order to indicate improved functional mobility and decreased fall risk.  (Target Date: 11/28/17)    Time  8    Period  Weeks    Status  New      PT LONG TERM GOAL #2   Title  Pt will improve BERG balance score to 50/56 in order to indicate decreased fall risk.      Baseline  updated due to progress    Time  8    Period  Weeks    Status  Revised      PT LONG TERM GOAL #3   Title  Pt will improve 6MWT by 150' from baseline in order to indicate improved functional endurance.      Time  8    Period  Weeks    Status  New      PT LONG TERM GOAL #4   Title  Pt will ambulate 150' over indoor surfaces without cane in order to indicate improved household mobility.      Time  8    Period  Weeks    Status  New      PT LONG TERM GOAL #5   Title  Pt will ambulate with gait speed >/=2.62 ft/sec w/ LRAD in order to indicate pt is safe community ambulator.      Time  8    Period  Weeks    Status  New            Plan - 11/17/17 1112    Clinical Impression Statement  Skilled session assessed gait with use of PLS AFO with tennis shoe.  Note no toe catches and improved quality of gait, however did not impact his gait speed.  Education on differences between bracing/support options as it relates to shoe wear.  Educated pt to wear loafers at next session as PT would like to address balance and gait with these, as this is what he wears at all times.  Pt verbalized understanding.  Pt should be on target to meet most LTGs at next session.     Rehab Potential  Good    Clinical Impairments Affecting Rehab Potential  co-morbidities    PT Frequency  1x / week would recommend 2x/wk however he has high copay    PT Duration  8 weeks    PT Treatment/Interventions  ADLs/Self Care Home Management;DME Instruction;Gait training;Stair training;Functional mobility training;Therapeutic activities;Therapeutic exercise;Balance training;Neuromuscular re-education;Cognitive  remediation;Patient/family education;Orthotic Fit/Training;Passive range of motion;Energy conservation;Vestibular    PT Next Visit Plan  condense HEP, LTGs and D/C    Consulted and Agree with Plan of Care  Patient       Patient will benefit from skilled therapeutic intervention in order to improve the following deficits and impairments:  Cardiopulmonary status limiting activity, Decreased activity tolerance, Decreased balance, Decreased endurance, Decreased knowledge of use of DME, Decreased mobility, Decreased strength, Difficulty walking, Impaired flexibility, Impaired perceived functional ability, Postural dysfunction  Visit Diagnosis: Unsteadiness on feet  Other abnormalities of gait and mobility  Muscle weakness (generalized)  Repeated falls     Problem List Patient Active Problem List   Diagnosis Date Noted  . Physical deconditioning 08/11/2017  .  Shortness of breath 10/24/2014  . COPD (chronic obstructive pulmonary disease) with emphysema ([place]) 09/01/2014  . Fatigue 10/14/2011  . Atrial fibrillation ([place]) 05/15/2011  . Premature ventricular contractions (PVCs) (VPCs) 02/25/2011  . 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  . PERSISTENT DISORDER INITIATING/MAINTAINING SLEEP 05/10/2010  . HYPERCHOLESTEROLEMIA 05/09/2010  . GOUT 05/09/2010  . OBESITY 05/09/2010  . Obstructive sleep apnea 05/09/2010  . HYPERTENSION 05/09/2010  . VENTRICULAR TACHYCARDIA 05/09/2010    Cameron Sprang, PT, MPT [facility] [address] [place], [place], [address] Phone: [phone]   Fax:  [phone] 11/17/17, 11:16 AM  Name: [name] MRN: [id number] Date of Birth: 1934/03/24

## 2017-11-24 ENCOUNTER — Other Ambulatory Visit: Payer: Medicare Other | Admitting: *Deleted

## 2017-11-24 ENCOUNTER — Encounter: Payer: Self-pay | Admitting: Rehabilitative and Restorative Service Providers"

## 2017-11-24 ENCOUNTER — Ambulatory Visit: Payer: Medicare Other | Admitting: Rehabilitative and Restorative Service Providers"

## 2017-11-24 DIAGNOSIS — R2681 Unsteadiness on feet: Secondary | ICD-10-CM

## 2017-11-24 DIAGNOSIS — R2689 Other abnormalities of gait and mobility: Secondary | ICD-10-CM

## 2017-11-24 DIAGNOSIS — I4891 Unspecified atrial fibrillation: Secondary | ICD-10-CM

## 2017-11-24 DIAGNOSIS — M6281 Muscle weakness (generalized): Secondary | ICD-10-CM

## 2017-11-24 LAB — BASIC METABOLIC PANEL
BUN/Creatinine Ratio: 36 — ABNORMAL HIGH (ref 10–24)
BUN: 53 mg/dL — ABNORMAL HIGH (ref 8–27)
CO2: 19 mmol/L — ABNORMAL LOW (ref 20–29)
Calcium: 9.7 mg/dL (ref 8.6–10.2)
Chloride: 107 mmol/L — ABNORMAL HIGH (ref 96–106)
Creatinine, Ser: 1.46 mg/dL — ABNORMAL HIGH (ref 0.76–1.27)
GFR calc Af Amer: 51 mL/min/{1.73_m2} — ABNORMAL LOW (ref >59)
GFR calc non Af Amer: 44 mL/min/{1.73_m2} — ABNORMAL LOW (ref >59)
Glucose: 94 mg/dL (ref 65–99)
Potassium: 5.4 mmol/L — ABNORMAL HIGH (ref 3.5–5.2)
Sodium: 139 mmol/L (ref 134–144)

## 2017-11-24 NOTE — Therapy (Signed)
[name] [address] [place], [place], [address] Phone: [phone]   Fax:  [phone]  Physical Therapy Treatment  Patient Details  Name: [name] MRN: [id number] Date of Birth: June 06, 1934 Referring Provider: Simonne Maffucci, MD   Encounter Date: 11/24/2017  PT End of Session - 11/24/17 0901    Visit Number  8    Number of Visits  9    Date for PT Re-Evaluation  11/28/17    Authorization Type  [employer]HC MCR    PT Start Time  0848    PT Stop Time  0928    PT Time Calculation (min)  40 min    Activity Tolerance  Patient tolerated treatment well    Behavior During Therapy  [name] for tasks assessed/performed       Past Medical History:  Diagnosis Date  . BPH (benign prostatic hyperplasia)   . Coronary artery ectasia    Mild CAD with normal systolic function per cath in August of 2011  . Diaphragmatic paralysis    felt to be partially responsible for dyspnea  . Dyspnea    due to paralyzed diaphragm and pulmonary issues  . Gout   . HTN (hypertension)   . Hypercholesteremia   . Microhematuria   . Obesity   . OSA (obstructive sleep apnea)   . Paroxysmal atrial fibrillation (HCC)    occured in the setting of acute E Coli sepsis and ileius 7/12  . PVC (premature ventricular contraction)    s/p PVC ablation 05/29/2010    Past Surgical History:  Procedure Laterality Date  . APPENDECTOMY  1978  . CARDIAC CATHETERIZATION  04-30-2010   Left main coronary artery is normal.   . CARDIOVASCULAR STRESS TEST  03-19-2010   0%  . CHOLECYSTECTOMY  1990  . poly removed from nose as a child    . PVC ablation  05/29/2010  . US ECHOCARDIOGRAPHY  03-09-2010   EF 55-60%    There were no vitals filed for this visit.  Subjective Assessment - 11/24/17 0852    Subjective  The patient reports the brace last session did not feel good.  He is doing HEP regularly and it takes about 30 minutes.    Limitations  House hold  activities;Walking    Patient Stated Goals  "I want to improve my balance."     Currently in Pain?  No/denies         [facility] PT Assessment - 11/24/17 0902      6 Minute Walk- Baseline   6 Minute Walk- Baseline  --      6 Minute walk- Post Test   6 Minute Walk Post Test  yes    Modified Borg Scale for Dyspnea  2- Mild shortness of breath    Perceived Rate of Exertion (Borg)  13- Somewhat hard      6 minute walk test results    Aerobic Endurance Distance Walked  875    Endurance additional comments  with straight cane, no rest break      Standardized Balance Assessment   Standardized Balance Assessment  Berg Balance Test      Berg Balance Test   Sit to Stand  Able to stand without using hands and stabilize independently    Standing Unsupported  Able to stand safely 2 minutes    Sitting with Back Unsupported but Feet Supported on Floor or Stool  Able to sit safely and securely 2 minutes    Stand to  Sit  Sits safely with minimal use of hands    Transfers  Able to transfer safely, minor use of hands    Standing Unsupported with Eyes Closed  Able to stand 10 seconds safely    Standing Ubsupported with Feet Together  Able to place feet together independently and stand 1 minute safely    From Standing, Reach Forward with Outstretched Arm  Can reach forward >12 cm safely (5")    From Standing Position, Pick up Object from Floor  Able to pick up shoe safely and easily    From Standing Position, Turn to Look Behind Over each Shoulder  Looks behind from both sides and weight shifts well    Turn 360 Degrees  Able to turn 360 degrees safely but slowly    Standing Unsupported, Alternately Place Feet on Step/Stool  Able to complete >2 steps/needs minimal assist    Standing Unsupported, One Foot in Front  Able to plae foot ahead of the other independently and hold 30 seconds    Standing on One Leg  Tries to lift leg/unable to hold 3 seconds but remains standing independently    Total Score  46     Berg comment:  46/56 improved from eval of 38/56 - last score was 47/56                   [facility] Adult PT Treatment/Exercise - 11/24/17 4917      Ambulation/Gait   Ambulation/Gait  Yes    Ambulation/Gait Assistance  5: Supervision;6: Modified independent (Device/Increase time)    Ambulation/Gait Assistance Details  Walked into PT mod indep with SPC indoor surfaces.  PT provided close supervision on community surfaces    Ambulation Distance (Feet)  300 Feet outdoor, then 250 indoors    Assistive device  Straight cane attempted L side cane use/ prefers R    Ambulation Surface  Level;Indoor;Outdoor;Paved;Grass    Gait Comments  Community gait with SPC on grass, negotiating curbs, and unlevel/paved surfaces.  Patient performed without loss of balance.  He wore loafers to PT today and did not have any episodes of toe catching with gait indoors/outdoors.       Self-Care   Self-Care  Other Self-Care Comments    Other Self-Care Comments   Discussed community exercise classes as he has silver sneakers.  *Planning to do pulmonary rehab.  He has treadmill and currently walks 10 minutes (with one rest break) with UE support.    Discussed home exercise program and patient feels he can do and alternate days with balance exercises/pulmonary rehab once he begins at pulmonary rehab.  PT and patient discussed that pulmonary rehab may recommend use of RW while participating in class environment.  He inquired about transition process to pulmonary rehab.  PT to check with primary PT and reach out of pulmonary rehab.                PT Short Term Goals - 11/24/17 0910      PT SHORT TERM GOAL #1   Title  Pt will initiate HEP in order to indicate improved functional mobility and decreased fall risk.  (Target Date: 10/29/17)    Baseline  met 10/27/17    Time  4    Period  Weeks    Status  Achieved      PT SHORT TERM GOAL #2   Title  Pt will perform 5TSS in </=15 secs without UE support in  order to indicate improved functional strength.  Baseline  15.06 secs with single UE support, 24.87 (first trial, 31.78 secs 2nd trial)with support on legs    Time  4    Period  Weeks    Status  Not Met      PT SHORT TERM GOAL #3   Title  Will assess 6MWT and improve distance by 48' in order to indicate improved functional endurance.     Baseline  Baseline 784' w/ SPC and single standing rest break 10/06/17    Time  4    Period  Weeks    Status  New      PT SHORT TERM GOAL #4   Title  Pt will improve BERG balance score to 41/56 in order to indicate decreased fall risk.      Baseline  47/56 on 10/27/17    Time  4    Period  Weeks    Status  Achieved      PT SHORT TERM GOAL #5   Title  Pt will ambulate 250' w/ LRAD over unlevel outdoor surfaces including curb/ramp at mod I level in order to indicate safe return to community mobility.      Baseline  Met on 11/24/2017    Time  4    Period  Weeks    Status  Achieved        PT Long Term Goals - 11/24/17 0911      PT LONG TERM GOAL #1   Title  Pt will be independent with HEP in order to indicate improved functional mobility and decreased fall risk.  (Target Date: 11/28/17)    Baseline  Met per discussion on 11/24/2017    Time  8    Period  Weeks    Status  Achieved      PT LONG TERM GOAL #2   Title  Pt will improve BERG balance score to 50/56 in order to indicate decreased fall risk.      Baseline  updated due to progress    Time  8    Period  Weeks    Status  Partially Met      PT LONG TERM GOAL #3   Title  Pt will improve 6MWT by 150' from baseline in order to indicate improved functional endurance.      Baseline  726 at last measure up to 875 feet.  784 at eval.    Time  8    Period  Weeks    Status  Partially Met      PT LONG TERM GOAL #4   Title  Pt will ambulate 150' over indoor surfaces without cane in order to indicate improved household mobility.      Baseline  Met on 11/24/2017    Time  8    Period  Weeks     Status  Achieved      PT LONG TERM GOAL #5   Title  Pt will ambulate with gait speed >/=2.62 ft/sec w/ LRAD in order to indicate pt is safe community ambulator.      Baseline  2.65 ft/sec with SPC mod indep.    Time  8    Period  Weeks    Status  Achieved            Plan - 11/24/17 1957    Clinical Impression Statement  The patient has met 3 LTGs and partially met 2 LTGs.  He notes he feels prepared to end PT with home program to continue post d/c.  He also wants to transition to pulmonary rehab.  Primary PT out today--will discuss plan of care and transition with Cameron Sprang before completing formal d/c.     PT Treatment/Interventions  ADLs/Self Care Home Management;DME Instruction;Gait training;Stair training;Functional mobility training;Therapeutic activities;Therapeutic exercise;Balance training;Neuromuscular re-education;Cognitive remediation;Patient/family education;Orthotic Fit/Training;Passive range of motion;Energy conservation;Vestibular    PT Next Visit Plan  Discharge today    Consulted and Agree with Plan of Care  Patient       Patient will benefit from skilled therapeutic intervention in order to improve the following deficits and impairments:  Cardiopulmonary status limiting activity, Decreased activity tolerance, Decreased balance, Decreased endurance, Decreased knowledge of use of DME, Decreased mobility, Decreased strength, Difficulty walking, Impaired flexibility, Impaired perceived functional ability, Postural dysfunction  Visit Diagnosis: Unsteadiness on feet  Other abnormalities of gait and mobility  Muscle weakness (generalized)     Problem List Patient Active Problem List   Diagnosis Date Noted  . Physical deconditioning 08/11/2017  . Shortness of breath 10/24/2014  . COPD (chronic obstructive pulmonary disease) with emphysema ([place]) 09/01/2014  . Fatigue 10/14/2011  . Atrial fibrillation ([place]) 05/15/2011  . Premature ventricular contractions  (PVCs) (VPCs) 02/25/2011  . 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  . PERSISTENT DISORDER INITIATING/MAINTAINING SLEEP 05/10/2010  . HYPERCHOLESTEROLEMIA 05/09/2010  . GOUT 05/09/2010  . OBESITY 05/09/2010  . Obstructive sleep apnea 05/09/2010  . HYPERTENSION 05/09/2010  . VENTRICULAR TACHYCARDIA 05/09/2010    Kariann Wecker, PT 11/24/2017, 7:59 PM  [facility] [address] [place], [place], [address] Phone: [phone]   Fax:  [phone]  Name: [name] MRN: [id number] Date of Birth: 01/07/1934

## 2017-11-25 ENCOUNTER — Other Ambulatory Visit: Payer: Self-pay | Admitting: *Deleted

## 2017-11-25 DIAGNOSIS — R0602 Shortness of breath: Secondary | ICD-10-CM

## 2017-11-25 DIAGNOSIS — I10 Essential (primary) hypertension: Secondary | ICD-10-CM

## 2017-11-25 MED ORDER — HYDRALAZINE HCL 25 MG PO TABS: 25.0000 mg | ORAL_TABLET | Freq: Two times a day (BID) | ORAL | 6 refills | Status: DC

## 2017-11-28 ENCOUNTER — Encounter: Payer: Self-pay | Admitting: Rehabilitative and Restorative Service Providers"

## 2017-11-28 NOTE — Therapy (Signed)
[facility] [address] [place], [place], [address] Phone: [phone]   Fax:  [phone]  Patient Details  Name: [name] MRN: [id number] Date of Birth: 04/05/1934 Referring Provider:  No ref. provider found  Encounter Date: last encounter 11/24/17  PHYSICAL THERAPY DISCHARGE SUMMARY  Visits from Start of Care: 8  Current functional level related to goals / functional outcomes: PT Short Term Goals - 11/24/17 0910      PT SHORT TERM GOAL #1   Title  Pt will initiate HEP in order to indicate improved functional mobility and decreased fall risk.  (Target Date: 10/29/17)    Baseline  met 10/27/17    Time  4    Period  Weeks    Status  Achieved      PT SHORT TERM GOAL #2   Title  Pt will perform 5TSS in </=15 secs without UE support in order to indicate improved functional strength.      Baseline  15.06 secs with single UE support, 24.87 (first trial, 31.78 secs 2nd trial)with support on legs    Time  4    Period  Weeks    Status  Not Met      PT SHORT TERM GOAL #3   Title  Will assess 6MWT and improve distance by 55' in order to indicate improved functional endurance.     Baseline  Baseline 784' w/ SPC and single standing rest break 10/06/17    Time  4    Period  Weeks    Status  New      PT SHORT TERM GOAL #4   Title  Pt will improve BERG balance score to 41/56 in order to indicate decreased fall risk.      Baseline  47/56 on 10/27/17    Time  4    Period  Weeks    Status  Achieved      PT SHORT TERM GOAL #5   Title  Pt will ambulate 250' w/ LRAD over unlevel outdoor surfaces including curb/ramp at mod I level in order to indicate safe return to community mobility.      Baseline  Met on 11/24/2017    Time  4    Period  Weeks    Status  Achieved      PT Long Term Goals - 11/24/17 0911      PT LONG TERM GOAL #1   Title  Pt will be independent with HEP in order to indicate improved functional mobility  and decreased fall risk.  (Target Date: 11/28/17)    Baseline  Met per discussion on 11/24/2017    Time  8    Period  Weeks    Status  Achieved      PT LONG TERM GOAL #2   Title  Pt will improve BERG balance score to 50/56 in order to indicate decreased fall risk.      Baseline  updated due to progress    Time  8    Period  Weeks    Status  Partially Met      PT LONG TERM GOAL #3   Title  Pt will improve 6MWT by 150' from baseline in order to indicate improved functional endurance.      Baseline  726 at last measure up to 875 feet.  784 at eval.    Time  8    Period  Weeks    Status  Partially Met  PT LONG TERM GOAL #4   Title  Pt will ambulate 150' over indoor surfaces without cane in order to indicate improved household mobility.      Baseline  Met on 11/24/2017    Time  8    Period  Weeks    Status  Achieved      PT LONG TERM GOAL #5   Title  Pt will ambulate with gait speed >/=2.62 ft/sec w/ LRAD in order to indicate pt is safe community ambulator.      Baseline  2.65 ft/sec with SPC mod indep.    Time  8    Period  Weeks    Status  Achieved         Remaining deficits: Decreased high level balance Decreased endurance   Education / Equipment: Home exercise program Transition to pulmonary rehab - patient notes it was plan since evaluation *PT to contact pulmonary rehab to alert them of d/c from PT.  Plan: Patient agrees to discharge.  Patient goals were not met. Patient is being discharged due to meeting the stated rehab goals.  ?????         Thank you for the referral of this patient. Rudell Cobb, MPT    [name] 11/28/2017, 12:21 PM  [facility] [address] [place], [place], [address] Phone: [phone]   Fax:  [phone]

## 2017-12-02 ENCOUNTER — Other Ambulatory Visit: Payer: Medicare Other | Admitting: *Deleted

## 2017-12-02 ENCOUNTER — Telehealth (HOSPITAL_COMMUNITY): Payer: Self-pay

## 2017-12-02 DIAGNOSIS — I10 Essential (primary) hypertension: Secondary | ICD-10-CM

## 2017-12-02 NOTE — Telephone Encounter (Signed)
Attempted to call and follow up with patient as he was sent to [facility] - lm on vm

## 2017-12-03 ENCOUNTER — Other Ambulatory Visit: Payer: Self-pay

## 2017-12-03 ENCOUNTER — Telehealth: Payer: Self-pay | Admitting: Nurse Practitioner

## 2017-12-03 ENCOUNTER — Telehealth: Payer: Self-pay | Admitting: Rehabilitative and Restorative Service Providers"

## 2017-12-03 DIAGNOSIS — I10 Essential (primary) hypertension: Secondary | ICD-10-CM

## 2017-12-03 LAB — BASIC METABOLIC PANEL
BUN/Creatinine Ratio: 39 — ABNORMAL HIGH (ref 10–24)
BUN: 56 mg/dL — ABNORMAL HIGH (ref 8–27)
CO2: 20 mmol/L (ref 20–29)
Calcium: 9 mg/dL (ref 8.6–10.2)
Chloride: 107 mmol/L — ABNORMAL HIGH (ref 96–106)
Creatinine, Ser: 1.42 mg/dL — ABNORMAL HIGH (ref 0.76–1.27)
GFR calc Af Amer: 53 mL/min/{1.73_m2} — ABNORMAL LOW (ref >59)
GFR calc non Af Amer: 46 mL/min/{1.73_m2} — ABNORMAL LOW (ref >59)
Glucose: 105 mg/dL — ABNORMAL HIGH (ref 65–99)
Potassium: 4.9 mmol/L (ref 3.5–5.2)
Sodium: 141 mmol/L (ref 134–144)

## 2017-12-03 NOTE — Telephone Encounter (Signed)
PT spoke with scheduler @ pulmonary rehab regarding transition from therapy to pulmonary rehab.  Patient has completed OP PT services.  Silverio Hagan, PT

## 2017-12-03 NOTE — Telephone Encounter (Signed)
New Message ° ° °Patient is returning call in reference to labs. Please call to discuss.  °

## 2017-12-03 NOTE — Telephone Encounter (Signed)
Spoke with patient and gave him L. Gerhardt's recommendations.. Patient verbalized understanding.Marland Kitchen

## 2017-12-04 ENCOUNTER — Other Ambulatory Visit: Payer: Self-pay | Admitting: *Deleted

## 2017-12-04 DIAGNOSIS — I10 Essential (primary) hypertension: Secondary | ICD-10-CM

## 2017-12-04 DIAGNOSIS — R748 Abnormal levels of other serum enzymes: Secondary | ICD-10-CM

## 2017-12-09 ENCOUNTER — Telehealth (HOSPITAL_COMMUNITY): Payer: Self-pay

## 2017-12-09 NOTE — Telephone Encounter (Signed)
Called and spoke with patient to see how he is doing since being d/c from [facility]. Patient stated he is still having balance issues. Patient has fallen since being d/c. Patients last fall was 2 weeks ago in front of [employer]. Passed referral to RN for further review.

## 2017-12-12 ENCOUNTER — Telehealth (HOSPITAL_COMMUNITY): Payer: Self-pay

## 2017-12-16 ENCOUNTER — Telehealth: Payer: Self-pay | Admitting: Pulmonary Disease

## 2017-12-16 NOTE — Telephone Encounter (Signed)
Spoke with pt, he states he will bring the insurance forms by here for BQ to review and sign. Pt states he will bring it up here. Will await the form. Nothing further is needed.

## 2017-12-17 ENCOUNTER — Other Ambulatory Visit: Payer: Medicare Other | Admitting: *Deleted

## 2017-12-17 ENCOUNTER — Other Ambulatory Visit: Payer: Medicare Other

## 2017-12-17 ENCOUNTER — Telehealth: Payer: Self-pay

## 2017-12-17 DIAGNOSIS — R748 Abnormal levels of other serum enzymes: Secondary | ICD-10-CM

## 2017-12-17 NOTE — Telephone Encounter (Signed)
Form has been received and given to [place] to follow up on.

## 2017-12-18 ENCOUNTER — Telehealth (HOSPITAL_COMMUNITY): Payer: Self-pay

## 2017-12-18 LAB — BASIC METABOLIC PANEL
BUN/Creatinine Ratio: 29 — ABNORMAL HIGH (ref 10–24)
BUN: 35 mg/dL — ABNORMAL HIGH (ref 8–27)
CO2: 22 mmol/L (ref 20–29)
Calcium: 9.1 mg/dL (ref 8.6–10.2)
Chloride: 106 mmol/L (ref 96–106)
Creatinine, Ser: 1.21 mg/dL (ref 0.76–1.27)
GFR calc Af Amer: 64 mL/min/{1.73_m2} (ref >59)
GFR calc non Af Amer: 55 mL/min/{1.73_m2} — ABNORMAL LOW (ref >59)
Glucose: 96 mg/dL (ref 65–99)
Potassium: 5.3 mmol/L — ABNORMAL HIGH (ref 3.5–5.2)
Sodium: 141 mmol/L (ref 134–144)

## 2017-12-18 NOTE — Telephone Encounter (Signed)
Pt is calling about insurance paperwork. States this is urgent. Cb is [phone].

## 2017-12-18 NOTE — Telephone Encounter (Signed)
Called and spoke to patient. Patient stated that this paperwork is extremely important.  Advised patient that BQ is in [facility] today but will be back tomorrow. Patient stated he will check back tomorrow to see if the form has been completed.

## 2017-12-18 NOTE — Telephone Encounter (Signed)
Called and spoke with patient in regards to PR - Scheduled orientation on 01/28/18 at 9:30am. Patient will attend the 10:30am exc class. Mailed packet.

## 2017-12-19 ENCOUNTER — Telehealth: Payer: Self-pay | Admitting: Pulmonary Disease

## 2017-12-19 NOTE — Telephone Encounter (Signed)
See note from 12/16/17. BQ is aware of the form and will fill it out soon. Addressed this earlier with the patient and he verbalized understanding.

## 2017-12-19 NOTE — Telephone Encounter (Signed)
Spoke with patient in lobby. He was checking to see if the forms were ready for pickup. Advised patient that we have given the forms to BQ and that he will fill them out as soon as he can.   He verbalized understanding.

## 2017-12-19 NOTE — Telephone Encounter (Signed)
Form has been filled out to best of my ability and placed in BQ's folder for completion.

## 2017-12-19 NOTE — Telephone Encounter (Signed)
Pt is in the lobby requesting the insurance forms.

## 2017-12-22 NOTE — Telephone Encounter (Signed)
I have this completed form in my possession- it will be brought back to the [facility] office tomorrow morning.

## 2017-12-22 NOTE — Telephone Encounter (Signed)
I signed this form today

## 2017-12-22 NOTE — Telephone Encounter (Signed)
Patient is calling to see if Dr. [name] was able to fill out these forms this am?  CB is [phone].  Patient asked for call back today.

## 2017-12-22 NOTE — Telephone Encounter (Signed)
Patient called and stated that his forms if they aren't completed soon will end up costing him $8,000. Advised the patient that Dr. [name] and Caryl Pina will be back tomorrow. I checked in BQ's box and I don't see his folders so unable to check the status.  Will Route to [place] and BQ to follow up on.

## 2017-12-23 NOTE — Telephone Encounter (Signed)
Received signed form from triage. Patient is aware that forms have been signed and have been placed up front.   Nothing else needed at time of call.

## 2017-12-29 ENCOUNTER — Other Ambulatory Visit: Payer: Self-pay | Admitting: Acute Care

## 2018-01-24 ENCOUNTER — Other Ambulatory Visit: Payer: Self-pay | Admitting: Nurse Practitioner

## 2018-01-28 ENCOUNTER — Encounter (HOSPITAL_COMMUNITY)
Admission: RE | Admit: 2018-01-28 | Discharge: 2018-01-28 | Disposition: A | Discharge status: Home / Self Care | Payer: Medicare Other | Source: Ambulatory Visit | Attending: Pulmonary Disease | Admitting: Pulmonary Disease

## 2018-01-28 VITALS — BP 158/64 | Resp 14 | Ht 69.75 in | Wt 231.0 lb

## 2018-01-28 DIAGNOSIS — J432 Centrilobular emphysema: Secondary | ICD-10-CM | POA: Diagnosis not present

## 2018-01-28 NOTE — Progress Notes (Signed)
[name] 84 y.o. male Pulmonary Rehab Orientation Note Patient arrived today in Cardiac and Pulmonary Rehab for orientation to Pulmonary Rehab. He was transported from [employer] via wheel chair. He does not carry portable oxygen. Per pt, he does not use oxygen. Color good, skin warm and dry. Patient is oriented to time and place. Patient's medical history, psychosocial health, and medications reviewed. Psychosocial assessment reveals pt lives with their spouse. Pt is currently retired. Pt hobbies include watching TV and being on the computer/internet. Pt reports his stress level is moderate. Areas of stress/anxiety include health - fear of falling again.  Pt completed the BOOST program and has had 2 falls since then.  Pt does exhibit signs of depression. Pt has a long history of depression.  Pt takes Wellbutrin and Zoloft.  Pt feels these work for him. Signs of depression include hopelessness and difficulty maintaining sleep. PHQ2/9 score 1/ Pt shows fair  coping skills with positive outlook . Pt offered emotional support and reassurance.  Pt is quiet by nature and states that he is soft spoken. Will continue to monitor and evaluate progress toward psychosocial goal(s) of less fearful of falling and being safely ambulate. Physical assessment reveals heart rate is normal, breath sounds clear to auscultation, no wheezes, rales, or rhonchi. Grip strength equal, strong. Distal pulses palpable. Patient reports he does take medications as prescribed. Patient states he follows a Regular diet. Pt has been trying to lose weight but has been unsucessful. Pt is taking Keto pills and has not seen any weight loss. Patient's weight will be monitored closely. Demonstration and practice of PLB using pulse oximeter. Patient able to return demonstration satisfactorily. Safety and hand hygiene in the exercise area reviewed with patient. Patient voices understanding of the information reviewed. Department expectations  discussed with patient and achievable goals were set. The patient shows some enthusiasm about attending the program and we look forward to working with this nice patient. The patient is scheduled for a 6 min walk test on 5/30 and to begin exercise on 6/5 at 10:30.  45 minutes was spent on a variety of activities such as assessment of the patient, obtaining baseline data including height, weight, BMI, and grip strength, verifying medical history, allergies, and current medications, and teaching patient strategies for performing tasks with less respiratory effort with emphasis on pursed lip breathing..carle

## 2018-02-02 ENCOUNTER — Other Ambulatory Visit: Payer: Self-pay | Admitting: Nurse Practitioner

## 2018-02-05 ENCOUNTER — Encounter (HOSPITAL_COMMUNITY)
Admission: RE | Admit: 2018-02-05 | Discharge: 2018-02-05 | Disposition: A | Discharge status: Home / Self Care | Payer: Medicare Other | Source: Ambulatory Visit | Attending: Pulmonary Disease | Admitting: Pulmonary Disease

## 2018-02-05 DIAGNOSIS — J432 Centrilobular emphysema: Secondary | ICD-10-CM | POA: Diagnosis not present

## 2018-02-05 NOTE — Progress Notes (Signed)
[name] 84 y.o. male  DOB: August 27, 1934 MRN: [id number]           Nutrition Note 1. Centrilobular emphysema ([place])    Past Medical History:  Diagnosis Date  . BPH (benign prostatic hyperplasia)   . Coronary artery ectasia    Mild CAD with normal systolic function per cath in August of 2011  . Diaphragmatic paralysis    felt to be partially responsible for dyspnea  . Dyspnea    due to paralyzed diaphragm and pulmonary issues  . Gout   . HTN (hypertension)   . Hypercholesteremia   . Microhematuria   . Obesity   . OSA (obstructive sleep apnea)   . Paroxysmal atrial fibrillation (H[facility])    occured in the setting of acute E Coli sepsis and ileius 7/12  . PVC (premature ventricular contraction)    s/p PVC ablation 05/29/2010   Meds reviewed.  Ht: Ht Readings from Last 1 Encounters:  01/28/18 5' 9.75" (1.772 m)    Wt:  Wt Readings from Last 3 Encounters:  01/28/18 231 lb 0.7 oz (104.8 kg)  11/05/17 220 lb 12.8 oz (100.2 kg)  08/11/17 215 lb 6.4 oz (97.7 kg)     BMI: 33.38    Current tobacco use? No  Labs:  Lipid Panel  No results found for: CHOL, TRIG, HDL, CHOLHDL, VLDL, LDLCALC, LDLDIRECT  No results found for: HGBA1C  Nutrition Diagnosis ? Food-and nutrition-related knowledge deficit related to lack of exposure to information as related to diagnosis of pulmonary disease ? Obesity related to excessive energy intake as evidenced by a BMI of 33.38  Goal(s) 1. To be determined with pt  Plan:  Pt to attend Pulmonary Nutrition class Will provide client-centered nutrition education as part of interdisciplinary care.   Monitor and evaluate progress toward nutrition goal with team.  Monitor and Evaluate progress toward nutrition goal with team.   Derek Mound, M.Ed, RD, LDN, CDE 02/05/2018 2:01 PM

## 2018-02-05 NOTE — Progress Notes (Signed)
Pt in this afternoon for his 5 minute walk test for pulmonary rehab s/p centrilobular emphysema. Pt o2 saturation checked and pt pulse noted to be "irregular sounding".  Pt with history of PAF and PVC ablation in 2011. Pt placed on monitor.  Pt with trigeminy PVC with compensatory pause post PVC.  Pt with no complaints compliant with his OSA, no caffeine and took all medications as prescribed.  Pt bp was 126/74, Pre o2 sat on RA 90%.  Pt okayed to complete walk test.  Pt noted during the walk test to desat to 85% at the 3 minute mark.  Pt placed on oxygen therapy at 2lnc with increase to 89 at 4 minutes , 90 at 6 minutes and 93  2 minutes post walk test.  Pt heart noted to now be regular with with pvcs noted.  Heart rate remained regular during the 2 minute rest period bp 140/68.  Will forward strips to Truitt Merle, NP for review. Cherre Huger, BSN Cardiac and Training and development officer

## 2018-02-06 NOTE — Progress Notes (Signed)
Pulmonary Individual Treatment Plan  Patient Details  Name: [name] MRN: [id number] Date of Birth: 1934/03/04 Referring Provider:    Initial Encounter Date:   Visit Diagnosis: Centrilobular emphysema ([place])  Patient's Home Medications on Admission:   Current Outpatient Medications:  .  allopurinol (ZYLOPRIM) 300 MG tablet, Take 300 mg by mouth daily.  , Disp: , Rfl:  .  amLODipine (NORVASC) 5 MG tablet, Take 5 mg by mouth daily., Disp: , Rfl: 6 .  ANORO ELLIPTA 62.5-25 MCG/INH AEPB, INHALE 1 PUFF BY MOUTH EVERY DAY (Patient not taking: Reported on 01/28/2018), Disp: 60 each, Rfl: 2 .  aspirin 81 MG tablet, Take 81 mg by mouth daily.  , Disp: , Rfl:  .  buPROPion (WELLBUTRIN SR) 100 MG 12 hr tablet, Take 100 mg by mouth 2 (two) times daily., Disp: , Rfl:  .  dicyclomine (BENTYL) 20 MG tablet, Take 20 mg by mouth 2 (two) times daily. , Disp: , Rfl:  .  doxazosin (CARDURA) 4 MG tablet, Take 1 tablet (4 mg total) by mouth daily., Disp: 30 tablet, Rfl: 3 .  hydrALAZINE (APRESOLINE) 25 MG tablet, Take 1 tablet (25 mg total) by mouth 2 (two) times daily., Disp: 60 tablet, Rfl: 6 .  ibuprofen (ADVIL,MOTRIN) 200 MG tablet, Take 200-400 mg by mouth every 6 (six) hours as needed (body aches). For pain, Disp: , Rfl:  .  lisinopril (PRINIVIL,ZESTRIL) 40 MG tablet, Take 40 mg by mouth daily. , Disp: , Rfl:  .  lovastatin (MEVACOR) 40 MG tablet, Take 40 mg by mouth at bedtime.  , Disp: , Rfl:  .  metoprolol succinate (TOPROL-XL) 25 MG 24 hr tablet, Take 25 mg by mouth 2 (two) times daily. , Disp: , Rfl:  .  sertraline (ZOLOFT) 100 MG tablet, Take 100 mg by mouth daily., Disp: , Rfl: 5 .  traZODone (DESYREL) 100 MG tablet, Take 100 mg by mouth at bedtime.  , Disp: , Rfl:  .  umeclidinium-vilanterol (ANORO ELLIPTA) 62.5-25 MCG/INH AEPB, Inhale 1 puff daily into the lungs., Disp: 2 each, Rfl: 0  Past Medical History: Past Medical History:  Diagnosis Date  . BPH (benign prostatic hyperplasia)   .  Coronary artery ectasia    Mild CAD with normal systolic function per cath in August of 2011  . Diaphragmatic paralysis    felt to be partially responsible for dyspnea  . Dyspnea    due to paralyzed diaphragm and pulmonary issues  . Gout   . HTN (hypertension)   . Hypercholesteremia   . Microhematuria   . Obesity   . OSA (obstructive sleep apnea)   . Paroxysmal atrial fibrillation (HCC)    occured in the setting of acute E Coli sepsis and ileius 7/12  . PVC (premature ventricular contraction)    s/p PVC ablation 05/29/2010    Tobacco Use: Social History   Tobacco Use  Smoking Status Former Smoker  . Packs/day: 3.00  . Years: 20.00  . Pack years: 60.00  . Types: Cigarettes  . Last attempt to quit: 09/09/1984  . Years since quitting: 33.4  Smokeless Tobacco Never Used    Labs: Recent Chemical engineer    Labs for ITP Cardiac and Pulmonary Rehab Latest Ref Rng & Units 07/03/2010 04/02/2011   PHART 7.350 - 7.450 7.414 -   PCO2ART 35.0 - 45.0 mmHg 37.5 -   HCO3 20.0 - 24.0 mEq/L 23.5 -   TCO2 0 - 100 mmol/L 20.4 23   ACIDBASEDEF 0.0 -  2.0 mmol/L 0.2 -   O2SAT % 95.1 -      Capillary Blood Glucose: Lab Results  Component Value Date   GLUCAP 117 (H) 04/03/2011     Pulmonary Assessment Scores: Pulmonary Assessment Scores    Row Name 02/06/18 0859         ADL UCSD   ADL Phase  Entry       mMRC Score   mMRC Score  3        Pulmonary Function Assessment:   Exercise Target Goals:    Exercise Program Goal: Individual exercise prescription set using results from initial 6 min walk test and THRR while considering  patient's activity barriers and safety.    Exercise Prescription Goal: Initial exercise prescription builds to 30-45 minutes a day of aerobic activity, 2-3 days per week.  Home exercise guidelines will be given to patient during program as part of exercise prescription that the participant will acknowledge.  Activity Barriers & Risk  Stratification:   6 Minute Walk: 6 Minute Walk    Row Name 02/06/18 0855 02/06/18 0857       6 Minute Walk   Phase  Initial  (Pended)   Initial    Distance  1049 feet  (Pended)   1049 feet    Walk Time  6 minutes  (Pended)   6 minutes    # of Rest Breaks  0  (Pended)   0    MPH  1.98  (Pended)   1.98    METS  -  2.53    RPE  -  12    Perceived Dyspnea   -  1    Symptoms  -  Yes (comment)    Comments  -  used rollator-no other complaints-zoll showed trigeminy-cardiologist made aware-sent strips    Resting HR  -  75 bpm    Resting BP  -  126/64    Resting Oxygen Saturation   -  90 %    Exercise Oxygen Saturation  during 6 min walk  -  85 %    Max Ex. HR  -  106 bpm    Max Ex. BP  -  140/68      Interval HR   1 Minute HR  -  89    2 Minute HR  -  86    3 Minute HR  -  103    4 Minute HR  -  106    5 Minute HR  -  103    6 Minute HR  -  104    2 Minute Post HR  -  94    Interval Heart Rate?  -  Yes      Interval Oxygen   Interval Oxygen?  -  Yes    Baseline Oxygen Saturation %  -  90 %    1 Minute Oxygen Saturation %  -  89 %    1 Minute Liters of Oxygen  -  0 L    2 Minute Oxygen Saturation %  -  86 %    2 Minute Liters of Oxygen  -  0 L    3 Minute Oxygen Saturation %  -  85 %    3 Minute Liters of Oxygen  -  0 L    4 Minute Oxygen Saturation %  -  89 %    4 Minute Liters of Oxygen  -  2 L    5 Minute  Oxygen Saturation %  -  89 %    5 Minute Liters of Oxygen  -  2 L    6 Minute Oxygen Saturation %  -  90 %    6 Minute Liters of Oxygen  -  2 L    2 Minute Post Oxygen Saturation %  -  93 %    2 Minute Post Liters of Oxygen  -  2 L       Oxygen Initial Assessment: Oxygen Initial Assessment - 02/06/18 0854      Initial 6 min Walk   Oxygen Used  Continuous Patient not originally on 02. Desaturated to 85% on room air during walk test. 2 liters used. Will cont. to observe patient during exercise for oxygen needs.    Liters per minute  2      Program Oxygen  Prescription   Program Oxygen Prescription  Continuous;E-Tanks    Liters per minute  2       Oxygen Re-Evaluation:   Oxygen Discharge (Final Oxygen Re-Evaluation):   Initial Exercise Prescription:   Perform Capillary Blood Glucose checks as needed.  Exercise Prescription Changes:   Exercise Comments:   Exercise Goals and Review: Exercise Goals    Row Name 01/28/18 1113             Exercise Goals   Increase Physical Activity  Yes       Intervention  Provide advice, education, support and counseling about physical activity/exercise needs.;Develop an individualized exercise prescription for aerobic and resistive training based on initial evaluation findings, risk stratification, comorbidities and participant's personal goals.       Expected Outcomes  Short Term: Attend rehab on a regular basis to increase amount of physical activity.;Long Term: Add in home exercise to make exercise part of routine and to increase amount of physical activity.;Long Term: Exercising regularly at least 3-5 days a week.       Increase Strength and Stamina  Yes       Intervention  Provide advice, education, support and counseling about physical activity/exercise needs.;Develop an individualized exercise prescription for aerobic and resistive training based on initial evaluation findings, risk stratification, comorbidities and participant's personal goals.       Expected Outcomes  Short Term: Increase workloads from initial exercise prescription for resistance, speed, and METs.;Short Term: Perform resistance training exercises routinely during rehab and add in resistance training at home;Long Term: Improve cardiorespiratory fitness, muscular endurance and strength as measured by increased METs and functional capacity (6MWT)       Able to understand and use rate of perceived exertion (RPE) scale  Yes       Intervention  Provide education and explanation on how to use RPE scale       Expected Outcomes   Long Term:  Able to use RPE to guide intensity level when exercising independently;Short Term: Able to use RPE daily in rehab to express subjective intensity level       Able to understand and use Dyspnea scale  Yes       Intervention  Provide education and explanation on how to use Dyspnea scale       Expected Outcomes  Short Term: Able to use Dyspnea scale daily in rehab to express subjective sense of shortness of breath during exertion;Long Term: Able to use Dyspnea scale to guide intensity level when exercising independently       Knowledge and understanding of Target Heart Rate Range (THRR)  Yes  Intervention  Provide education and explanation of THRR including how the numbers were predicted and where they are located for reference       Expected Outcomes  Short Term: Able to state/look up THRR;Long Term: Able to use THRR to govern intensity when exercising independently;Short Term: Able to use daily as guideline for intensity in rehab       Understanding of Exercise Prescription  Yes       Intervention  Provide education, explanation, and written materials on patient's individual exercise prescription       Expected Outcomes  Short Term: Able to explain program exercise prescription;Long Term: Able to explain home exercise prescription to exercise independently          Exercise Goals Re-Evaluation :   Discharge Exercise Prescription (Final Exercise Prescription Changes):   Nutrition:  Target Goals: Understanding of nutrition guidelines, daily intake of sodium 1500mg , cholesterol 200mg , calories 30% from fat and 7% or less from saturated fats, daily to have 5 or more servings of fruits and vegetables.  Biometrics:    Nutrition Therapy Plan and Nutrition Goals: Nutrition Therapy & Goals - 02/05/18 1403      Nutrition Therapy   Diet  Heart Healthy      Intervention Plan   Intervention  Prescribe, educate and counsel regarding individualized specific dietary modifications  aiming towards targeted core components such as weight, hypertension, lipid management, diabetes, heart failure and other comorbidities.    Expected Outcomes  Short Term Goal: Understand basic principles of dietary content, such as calories, fat, sodium, cholesterol and nutrients.;Long Term Goal: Adherence to prescribed nutrition plan.       Nutrition Assessments: Nutrition Assessments - 02/05/18 1400      Rate Your Plate Scores   Pre Score  53       Nutrition Goals Re-Evaluation:   Nutrition Goals Discharge (Final Nutrition Goals Re-Evaluation):   Psychosocial: Target Goals: Acknowledge presence or absence of significant depression and/or stress, maximize coping skills, provide positive support system. Participant is able to verbalize types and ability to use techniques and skills needed for reducing stress and depression.  Initial Review & Psychosocial Screening: Initial Psych Review & Screening - 01/28/18 1026      Initial Review   Current issues with  Current Depression;History of Depression;Current Anxiety/Panic;Current Psychotropic Meds;Current Stress Concerns;Current Sleep Concerns    Source of Stress Concerns  Chronic Illness;Unable to participate in former interests or hobbies;Unable to perform yard/household activities    Comments  fearful of falling      [place]?  Yes      Barriers   Psychosocial barriers to participate in program  The patient should benefit from training in stress management and relaxation.      Screening Interventions   Interventions  Encouraged to exercise    Expected Outcomes  Short Term goal: Identification and review with participant of any Quality of Life or Depression concerns found by scoring the questionnaire.;Long Term Goal: Stressors or current issues are controlled or eliminated.       Quality of Life Scores:  Scores of 19 and below usually indicate a poorer quality of life in these areas.  A difference  of  2-3 points is a clinically meaningful difference.  A difference of 2-3 points in the total score of the Quality of Life Index has been associated with significant improvement in overall quality of life, self-image, physical symptoms, and general health in studies assessing change in quality of  life.   PHQ-9: Recent Review Flowsheet Data    Depression screen [facility] 2/9 01/28/2018 05/31/2015   Decreased Interest 0 0   Down, Depressed, Hopeless 1 0   PHQ - 2 Score 1 0   Altered sleeping 2 -   Tired, decreased energy 1 -   Feeling bad or failure about yourself  1 -   Trouble concentrating 1 -   Moving slowly or fidgety/restless 0 -   Suicidal thoughts 0 -   PHQ-9 Score 6 -   Difficult doing work/chores Somewhat difficult -     Interpretation of Total Score  Total Score Depression Severity:  1-4 = Minimal depression, 5-9 = Mild depression, 10-14 = Moderate depression, 15-19 = Moderately severe depression, 20-27 = Severe depression   Psychosocial Evaluation and Intervention: Psychosocial Evaluation - 01/28/18 1029      Psychosocial Evaluation & Interventions   Interventions  Stress management education;Relaxation education;Encouraged to exercise with the program and follow exercise prescription    Continue Psychosocial Services   Follow up required by staff       Psychosocial Re-Evaluation:   Psychosocial Discharge (Final Psychosocial Re-Evaluation):   Education: Education Goals: Education classes will be provided on a weekly basis, covering required topics. Participant will state understanding/return demonstration of topics presented.  Learning Barriers/Preferences: Learning Barriers/Preferences - 01/28/18 1029      Learning Barriers/Preferences   Learning Barriers  Sight    Learning Preferences  Computer/Internet;Verbal Instruction       Education Topics: Risk Factor Reduction:  -Group instruction that is supported by a PowerPoint presentation. Instructor discusses the  definition of a risk factor, different risk factors for pulmonary disease, and how the heart and lungs work together.     Nutrition for Pulmonary Patient:  -Group instruction provided by PowerPoint slides, verbal discussion, and written materials to support subject matter. The instructor gives an explanation and review of healthy diet recommendations, which includes a discussion on weight management, recommendations for fruit and vegetable consumption, as well as protein, fluid, caffeine, fiber, sodium, sugar, and alcohol. Tips for eating when patients are short of breath are discussed.   Pursed Lip Breathing:  -Group instruction that is supported by demonstration and informational handouts. Instructor discusses the benefits of pursed lip and diaphragmatic breathing and detailed demonstration on how to preform both.     Oxygen Safety:  -Group instruction provided by PowerPoint, verbal discussion, and written material to support subject matter. There is an overview of "What is Oxygen" and "Why do we need it".  Instructor also reviews how to create a safe environment for oxygen use, the importance of using oxygen as prescribed, and the risks of noncompliance. There is a brief discussion on traveling with oxygen and resources the patient may utilize.   Oxygen Equipment:  -Group instruction provided by [facility] Staff utilizing handouts, written materials, and equipment demonstrations.   Signs and Symptoms:  -Group instruction provided by written material and verbal discussion to support subject matter. Warning signs and symptoms of infection, stroke, and heart attack are reviewed and when to call the physician/911 reinforced. Tips for preventing the spread of infection discussed.   Advanced Directives:  -Group instruction provided by verbal instruction and written material to support subject matter. Instructor reviews Advanced Directive laws and proper instruction for filling out  document.   Pulmonary Video:  -Group video education that reviews the importance of medication and oxygen compliance, exercise, good nutrition, pulmonary hygiene, and pursed lip and diaphragmatic breathing for the pulmonary  patient.   Exercise for the Pulmonary Patient:  -Group instruction that is supported by a PowerPoint presentation. Instructor discusses benefits of exercise, core components of exercise, frequency, duration, and intensity of an exercise routine, importance of utilizing pulse oximetry during exercise, safety while exercising, and options of places to exercise outside of rehab.     Pulmonary Medications:  -Verbally interactive group education provided by instructor with focus on inhaled medications and proper administration.   Anatomy and Physiology of the Respiratory System and Intimacy:  -Group instruction provided by PowerPoint, verbal discussion, and written material to support subject matter. Instructor reviews respiratory cycle and anatomical components of the respiratory system and their functions. Instructor also reviews differences in obstructive and restrictive respiratory diseases with examples of each. Intimacy, Sex, and Sexuality differences are reviewed with a discussion on how relationships can change when diagnosed with pulmonary disease. Common sexual concerns are reviewed.   MD DAY -A group question and answer session with a medical doctor that allows participants to ask questions that relate to their pulmonary disease state.   OTHER EDUCATION -Group or individual verbal, written, or video instructions that support the educational goals of the pulmonary rehab program.   Holiday Eating Survival Tips:  -Group instruction provided by PowerPoint slides, verbal discussion, and written materials to support subject matter. The instructor gives patients tips, tricks, and techniques to help them not only survive but enjoy the holidays despite the onslaught of  food that accompanies the holidays.   Knowledge Questionnaire Score:   Core Components/Risk Factors/Patient Goals at Admission: Personal Goals and Risk Factors at Admission - 01/28/18 1023      Core Components/Risk Factors/Patient Goals on Admission    Weight Management  Obesity;Weight Loss    Improve shortness of breath with ADL's  Yes    Intervention  Provide education, individualized exercise plan and daily activity instruction to help decrease symptoms of SOB with activities of daily living.    Expected Outcomes  Short Term: Improve cardiorespiratory fitness to achieve a reduction of symptoms when performing ADLs;Long Term: Be able to perform more ADLs without symptoms or delay the onset of symptoms    Hypertension  Yes    Intervention  Provide education on lifestyle modifcations including regular physical activity/exercise, weight management, moderate sodium restriction and increased consumption of fresh fruit, vegetables, and low fat dairy, alcohol moderation, and smoking cessation.;Monitor prescription use compliance.    Expected Outcomes  Short Term: Continued assessment and intervention until BP is < 140/29mm HG in hypertensive participants. < 130/44mm HG in hypertensive participants with diabetes, heart failure or chronic kidney disease.;Long Term: Maintenance of blood pressure at goal levels.    Lipids  Yes    Intervention  Provide education and support for participant on nutrition & aerobic/resistive exercise along with prescribed medications to achieve LDL 70mg , HDL >40mg .    Expected Outcomes  Short Term: Participant states understanding of desired cholesterol values and is compliant with medications prescribed. Participant is following exercise prescription and nutrition guidelines.;Long Term: Cholesterol controlled with medications as prescribed, with individualized exercise RX and with personalized nutrition plan. Value goals: LDL < 70mg , HDL > 40 mg.    Stress  Yes     Intervention  Offer individual and/or small group education and counseling on adjustment to heart disease, stress management and health-related lifestyle change. Teach and support self-help strategies.;Refer participants experiencing significant psychosocial distress to appropriate mental health specialists for further evaluation and treatment. When possible, include family members and significant others in  education/counseling sessions.    Expected Outcomes  Short Term: Participant demonstrates changes in health-related behavior, relaxation and other stress management skills, ability to obtain effective social support, and compliance with psychotropic medications if prescribed.;Long Term: Emotional wellbeing is indicated by absence of clinically significant psychosocial distress or social isolation.       Core Components/Risk Factors/Patient Goals Review:    Core Components/Risk Factors/Patient Goals at Discharge (Final Review):    ITP Comments: ITP Comments    Row Name 01/28/18 1009           ITP Comments  Dr. Jennet Maduro          Comments:

## 2018-02-09 ENCOUNTER — Telehealth: Payer: Self-pay | Admitting: *Deleted

## 2018-02-09 NOTE — Telephone Encounter (Signed)
lvm for pt to call back to set up appt for 48 hour holter.

## 2018-02-09 NOTE — Telephone Encounter (Signed)
-----   Message from Burtis Junes, NP sent at 02/09/2018  8:09 AM EDT -----   ----- Message ----- From: Rowe Pavy, RN Sent: 02/05/2018   9:52 PM To: Burtis Junes, NP  See note from pulmonary rehab 6 minute walk test.  Strips sent for your review. Thanks Psychologist, clinical, BSN Cardiac and Training and development officer

## 2018-02-09 NOTE — Progress Notes (Signed)
I would like to get a 24 hour Holter to see what his PVC burden is. Please call and see if he is agreeable.   Burtis Junes, RN, [place] [address] [place], [place]  [address] [phone]

## 2018-02-10 ENCOUNTER — Other Ambulatory Visit: Payer: Self-pay | Admitting: *Deleted

## 2018-02-10 DIAGNOSIS — I493 Ventricular premature depolarization: Secondary | ICD-10-CM

## 2018-02-10 NOTE — Telephone Encounter (Signed)
Follow up    Scheduled pt appt for Holter montior, but still needs order attached, no order in epic

## 2018-02-10 NOTE — Telephone Encounter (Signed)
Order in system for 48 hour holter and linked.

## 2018-02-11 ENCOUNTER — Ambulatory Visit: Payer: Medicare Other | Admitting: Acute Care

## 2018-02-11 ENCOUNTER — Encounter: Payer: Self-pay | Admitting: Acute Care

## 2018-02-11 DIAGNOSIS — J432 Centrilobular emphysema: Secondary | ICD-10-CM | POA: Diagnosis not present

## 2018-02-11 DIAGNOSIS — G4733 Obstructive sleep apnea (adult) (pediatric): Secondary | ICD-10-CM | POA: Diagnosis not present

## 2018-02-11 MED ORDER — FLUTICASONE-UMECLIDIN-VILANT 100-62.5-25 MCG/INH IN AEPB: 1.0000 | INHALATION_SPRAY | Freq: Every day | RESPIRATORY_TRACT | 0 refills | Status: DC

## 2018-02-11 NOTE — Assessment & Plan Note (Signed)
Did not feel Anoro was helping, so is stopping it Plan: We will do a therapeutic trial with Trelegy Use 1 puff once daily Rinse mouth after use. Call us if you decide you would like a prescription.

## 2018-02-11 NOTE — Progress Notes (Signed)
Pulmonary Individual Treatment Plan  Patient Details  Name: [name] MRN: [id number] Date of Birth: 1933/10/31 Referring Provider:    Initial Encounter Date:   Visit Diagnosis: Centrilobular emphysema ([place])  Patient's Home Medications on Admission:   Current Outpatient Medications:  .  allopurinol (ZYLOPRIM) 300 MG tablet, Take 300 mg by mouth daily.  , Disp: , Rfl:  .  amLODipine (NORVASC) 5 MG tablet, Take 5 mg by mouth daily., Disp: , Rfl: 6 .  ANORO ELLIPTA 62.5-25 MCG/INH AEPB, INHALE 1 PUFF BY MOUTH EVERY DAY, Disp: 60 each, Rfl: 2 .  aspirin 81 MG tablet, Take 81 mg by mouth daily.  , Disp: , Rfl:  .  buPROPion (WELLBUTRIN SR) 100 MG 12 hr tablet, Take 100 mg by mouth 2 (two) times daily., Disp: , Rfl:  .  dicyclomine (BENTYL) 20 MG tablet, Take 20 mg by mouth 2 (two) times daily. , Disp: , Rfl:  .  doxazosin (CARDURA) 4 MG tablet, Take 1 tablet (4 mg total) by mouth daily., Disp: 30 tablet, Rfl: 3 .  Fluticasone-Umeclidin-Vilant (TRELEGY ELLIPTA) 100-62.5-25 MCG/INH AEPB, Inhale 1 puff into the lungs daily., Disp: 1 each, Rfl: 0 .  hydrALAZINE (APRESOLINE) 25 MG tablet, Take 1 tablet (25 mg total) by mouth 2 (two) times daily., Disp: 60 tablet, Rfl: 6 .  ibuprofen (ADVIL,MOTRIN) 200 MG tablet, Take 200-400 mg by mouth every 6 (six) hours as needed (body aches). For pain, Disp: , Rfl:  .  lisinopril (PRINIVIL,ZESTRIL) 40 MG tablet, Take 40 mg by mouth daily. , Disp: , Rfl:  .  lovastatin (MEVACOR) 40 MG tablet, Take 40 mg by mouth at bedtime.  , Disp: , Rfl:  .  metoprolol succinate (TOPROL-XL) 25 MG 24 hr tablet, Take 25 mg by mouth 2 (two) times daily. , Disp: , Rfl:  .  sertraline (ZOLOFT) 100 MG tablet, Take 100 mg by mouth daily., Disp: , Rfl: 5 .  traZODone (DESYREL) 100 MG tablet, Take 100 mg by mouth at bedtime.  , Disp: , Rfl:  .  umeclidinium-vilanterol (ANORO ELLIPTA) 62.5-25 MCG/INH AEPB, Inhale 1 puff daily into the lungs. (Patient not taking: Reported on  02/11/2018), Disp: 2 each, Rfl: 0  Past Medical History: Past Medical History:  Diagnosis Date  . BPH (benign prostatic hyperplasia)   . Coronary artery ectasia    Mild CAD with normal systolic function per cath in August of 2011  . Diaphragmatic paralysis    felt to be partially responsible for dyspnea  . Dyspnea    due to paralyzed diaphragm and pulmonary issues  . Gout   . HTN (hypertension)   . Hypercholesteremia   . Microhematuria   . Obesity   . OSA (obstructive sleep apnea)   . Paroxysmal atrial fibrillation (HCC)    occured in the setting of acute E Coli sepsis and ileius 7/12  . PVC (premature ventricular contraction)    s/p PVC ablation 05/29/2010    Tobacco Use: Social History   Tobacco Use  Smoking Status Former Smoker  . Packs/day: 3.00  . Years: 20.00  . Pack years: 60.00  . Types: Cigarettes  . Last attempt to quit: 09/09/1984  . Years since quitting: 33.4  Smokeless Tobacco Never Used    Labs: Recent Chemical engineer    Labs for ITP Cardiac and Pulmonary Rehab Latest Ref Rng & Units 07/03/2010 04/02/2011   PHART 7.350 - 7.450 7.414 -   PCO2ART 35.0 - 45.0 mmHg 37.5 -  HCO3 20.0 - 24.0 mEq/L 23.5 -   TCO2 0 - 100 mmol/L 20.4 23   ACIDBASEDEF 0.0 - 2.0 mmol/L 0.2 -   O2SAT % 95.1 -      Capillary Blood Glucose: Lab Results  Component Value Date   GLUCAP 117 (H) 04/03/2011     Pulmonary Assessment Scores: Pulmonary Assessment Scores    Row Name 02/06/18 0859         ADL UCSD   ADL Phase  Entry       mMRC Score   mMRC Score  3        Pulmonary Function Assessment:   Exercise Target Goals:    Exercise Program Goal: Individual exercise prescription set using results from initial 6 min walk test and THRR while considering  patient's activity barriers and safety.    Exercise Prescription Goal: Initial exercise prescription builds to 30-45 minutes a day of aerobic activity, 2-3 days per week.  Home exercise guidelines will be  given to patient during program as part of exercise prescription that the participant will acknowledge.  Activity Barriers & Risk Stratification:   6 Minute Walk: 6 Minute Walk    Row Name 02/06/18 0855 02/06/18 0857       6 Minute Walk   Phase  Initial  (Pended)   Initial    Distance  1049 feet  (Pended)   1049 feet    Walk Time  6 minutes  (Pended)   6 minutes    # of Rest Breaks  0  (Pended)   0    MPH  1.98  (Pended)   1.98    METS  -  2.53    RPE  -  12    Perceived Dyspnea   -  1    Symptoms  -  Yes (comment)    Comments  -  used rollator-no other complaints-zoll showed trigeminy-cardiologist made aware-sent strips    Resting HR  -  75 bpm    Resting BP  -  126/64    Resting Oxygen Saturation   -  90 %    Exercise Oxygen Saturation  during 6 min walk  -  85 %    Max Ex. HR  -  106 bpm    Max Ex. BP  -  140/68      Interval HR   1 Minute HR  -  89    2 Minute HR  -  86    3 Minute HR  -  103    4 Minute HR  -  106    5 Minute HR  -  103    6 Minute HR  -  104    2 Minute Post HR  -  94    Interval Heart Rate?  -  Yes      Interval Oxygen   Interval Oxygen?  -  Yes    Baseline Oxygen Saturation %  -  90 %    1 Minute Oxygen Saturation %  -  89 %    1 Minute Liters of Oxygen  -  0 L    2 Minute Oxygen Saturation %  -  86 %    2 Minute Liters of Oxygen  -  0 L    3 Minute Oxygen Saturation %  -  85 %    3 Minute Liters of Oxygen  -  0 L    4 Minute Oxygen Saturation %  -  89 %    4 Minute Liters of Oxygen  -  2 L    5 Minute Oxygen Saturation %  -  89 %    5 Minute Liters of Oxygen  -  2 L    6 Minute Oxygen Saturation %  -  90 %    6 Minute Liters of Oxygen  -  2 L    2 Minute Post Oxygen Saturation %  -  93 %    2 Minute Post Liters of Oxygen  -  2 L       Oxygen Initial Assessment: Oxygen Initial Assessment - 02/06/18 0854      Initial 6 min Walk   Oxygen Used  Continuous Patient not originally on 02. Desaturated to 85% on room air during walk test.  2 liters used. Will cont. to observe patient during exercise for oxygen needs.    Liters per minute  2      Program Oxygen Prescription   Program Oxygen Prescription  Continuous;E-Tanks    Liters per minute  2       Oxygen Re-Evaluation: Oxygen Re-Evaluation    [place] Name 02/11/18 1533             Program Oxygen Prescription   Program Oxygen Prescription  Continuous;E-Tanks         Home Oxygen   Home Oxygen Device  Home Concentrator       Sleep Oxygen Prescription  CPAP       Home Exercise Oxygen Prescription  None       Home at Rest Exercise Oxygen Prescription  None       Compliance with Home Oxygen Use  Yes         Goals/Expected Outcomes   Short Term Goals  To learn and demonstrate proper use of respiratory medications;To learn and demonstrate proper pursed lip breathing techniques or other breathing techniques.;To learn and understand importance of monitoring SPO2 with pulse oximeter and demonstrate accurate use of the pulse oximeter.;To learn and understand importance of maintaining oxygen saturations>88%;To learn and exhibit compliance with exercise, home and travel O2 prescription       Long  Term Goals  Exhibits compliance with exercise, home and travel O2 prescription;Verbalizes importance of monitoring SPO2 with pulse oximeter and return demonstration;Maintenance of O2 saturations>88%;Exhibits proper breathing techniques, such as pursed lip breathing or other method taught during program session;Compliance with respiratory medication;Demonstrates proper use of MDI's          Oxygen Discharge (Final Oxygen Re-Evaluation): Oxygen Re-Evaluation - 02/11/18 1533      Program Oxygen Prescription   Program Oxygen Prescription  Continuous;E-Tanks      Home Oxygen   Home Oxygen Device  Home Concentrator    Sleep Oxygen Prescription  CPAP    Home Exercise Oxygen Prescription  None    Home at Rest Exercise Oxygen Prescription  None    Compliance with Home Oxygen Use  Yes       Goals/Expected Outcomes   Short Term Goals  To learn and demonstrate proper use of respiratory medications;To learn and demonstrate proper pursed lip breathing techniques or other breathing techniques.;To learn and understand importance of monitoring SPO2 with pulse oximeter and demonstrate accurate use of the pulse oximeter.;To learn and understand importance of maintaining oxygen saturations>88%;To learn and exhibit compliance with exercise, home and travel O2 prescription    Long  Term Goals  Exhibits compliance with exercise, home and travel O2 prescription;Verbalizes importance of monitoring SPO2  with pulse oximeter and return demonstration;Maintenance of O2 saturations>88%;Exhibits proper breathing techniques, such as pursed lip breathing or other method taught during program session;Compliance with respiratory medication;Demonstrates proper use of MDI's       Initial Exercise Prescription:   Perform Capillary Blood Glucose checks as needed.  Exercise Prescription Changes:   Exercise Comments:   Exercise Goals and Review:  Exercise Goals    Row Name 01/28/18 1113             Exercise Goals   Increase Physical Activity  Yes       Intervention  Provide advice, education, support and counseling about physical activity/exercise needs.;Develop an individualized exercise prescription for aerobic and resistive training based on initial evaluation findings, risk stratification, comorbidities and participant's personal goals.       Expected Outcomes  Short Term: Attend rehab on a regular basis to increase amount of physical activity.;Long Term: Add in home exercise to make exercise part of routine and to increase amount of physical activity.;Long Term: Exercising regularly at least 3-5 days a week.       Increase Strength and Stamina  Yes       Intervention  Provide advice, education, support and counseling about physical activity/exercise needs.;Develop an individualized exercise  prescription for aerobic and resistive training based on initial evaluation findings, risk stratification, comorbidities and participant's personal goals.       Expected Outcomes  Short Term: Increase workloads from initial exercise prescription for resistance, speed, and METs.;Short Term: Perform resistance training exercises routinely during rehab and add in resistance training at home;Long Term: Improve cardiorespiratory fitness, muscular endurance and strength as measured by increased METs and functional capacity (6MWT)       Able to understand and use rate of perceived exertion (RPE) scale  Yes       Intervention  Provide education and explanation on how to use RPE scale       Expected Outcomes  Long Term:  Able to use RPE to guide intensity level when exercising independently;Short Term: Able to use RPE daily in rehab to express subjective intensity level       Able to understand and use Dyspnea scale  Yes       Intervention  Provide education and explanation on how to use Dyspnea scale       Expected Outcomes  Short Term: Able to use Dyspnea scale daily in rehab to express subjective sense of shortness of breath during exertion;Long Term: Able to use Dyspnea scale to guide intensity level when exercising independently       Knowledge and understanding of Target Heart Rate Range (THRR)  Yes       Intervention  Provide education and explanation of THRR including how the numbers were predicted and where they are located for reference       Expected Outcomes  Short Term: Able to state/look up THRR;Long Term: Able to use THRR to govern intensity when exercising independently;Short Term: Able to use daily as guideline for intensity in rehab       Understanding of Exercise Prescription  Yes       Intervention  Provide education, explanation, and written materials on patient's individual exercise prescription       Expected Outcomes  Short Term: Able to explain program exercise prescription;Long Term:  Able to explain home exercise prescription to exercise independently          Exercise Goals Re-Evaluation :   Discharge Exercise Prescription (Final Exercise Prescription Changes):  Nutrition:  Target Goals: Understanding of nutrition guidelines, daily intake of sodium 1500mg , cholesterol 200mg , calories 30% from fat and 7% or less from saturated fats, daily to have 5 or more servings of fruits and vegetables.  Biometrics:    Nutrition Therapy Plan and Nutrition Goals: Nutrition Therapy & Goals - 02/05/18 1403      Nutrition Therapy   Diet  Heart Healthy      Intervention Plan   Intervention  Prescribe, educate and counsel regarding individualized specific dietary modifications aiming towards targeted core components such as weight, hypertension, lipid management, diabetes, heart failure and other comorbidities.    Expected Outcomes  Short Term Goal: Understand basic principles of dietary content, such as calories, fat, sodium, cholesterol and nutrients.;Long Term Goal: Adherence to prescribed nutrition plan.       Nutrition Assessments: Nutrition Assessments - 02/05/18 1400      Rate Your Plate Scores   Pre Score  53       Nutrition Goals Re-Evaluation:   Nutrition Goals Discharge (Final Nutrition Goals Re-Evaluation):   Psychosocial: Target Goals: Acknowledge presence or absence of significant depression and/or stress, maximize coping skills, provide positive support system. Participant is able to verbalize types and ability to use techniques and skills needed for reducing stress and depression.  Initial Review & Psychosocial Screening: Initial Psych Review & Screening - 01/28/18 1026      Initial Review   Current issues with  Current Depression;History of Depression;Current Anxiety/Panic;Current Psychotropic Meds;Current Stress Concerns;Current Sleep Concerns    Source of Stress Concerns  Chronic Illness;Unable to participate in former interests or  hobbies;Unable to perform yard/household activities    Comments  fearful of falling      [place]?  Yes      Barriers   Psychosocial barriers to participate in program  The patient should benefit from training in stress management and relaxation.      Screening Interventions   Interventions  Encouraged to exercise    Expected Outcomes  Short Term goal: Identification and review with participant of any Quality of Life or Depression concerns found by scoring the questionnaire.;Long Term Goal: Stressors or current issues are controlled or eliminated.       Quality of Life Scores:  Scores of 19 and below usually indicate a poorer quality of life in these areas.  A difference of  2-3 points is a clinically meaningful difference.  A difference of 2-3 points in the total score of the Quality of Life Index has been associated with significant improvement in overall quality of life, self-image, physical symptoms, and general health in studies assessing change in quality of life.   PHQ-9: Recent Review Flowsheet Data    Depression screen [facility] 2/9 01/28/2018 05/31/2015   Decreased Interest 0 0   Down, Depressed, Hopeless 1 0   PHQ - 2 Score 1 0   Altered sleeping 2 -   Tired, decreased energy 1 -   Feeling bad or failure about yourself  1 -   Trouble concentrating 1 -   Moving slowly or fidgety/restless 0 -   Suicidal thoughts 0 -   PHQ-9 Score 6 -   Difficult doing work/chores Somewhat difficult -     Interpretation of Total Score  Total Score Depression Severity:  1-4 = Minimal depression, 5-9 = Mild depression, 10-14 = Moderate depression, 15-19 = Moderately severe depression, 20-27 = Severe depression   Psychosocial Evaluation and Intervention: Psychosocial Evaluation - 02/11/18 1538  Psychosocial Evaluation & Interventions   Comments  Pt with anxiety regarding falling    Expected Outcomes  Pt will be free of any psychosocial needs        Psychosocial Re-Evaluation:   Psychosocial Discharge (Final Psychosocial Re-Evaluation):   Education: Education Goals: Education classes will be provided on a weekly basis, covering required topics. Participant will state understanding/return demonstration of topics presented.  Learning Barriers/Preferences: Learning Barriers/Preferences - 01/28/18 1029      Learning Barriers/Preferences   Learning Barriers  Sight    Learning Preferences  Computer/Internet;Verbal Instruction       Education Topics: Risk Factor Reduction:  -Group instruction that is supported by a PowerPoint presentation. Instructor discusses the definition of a risk factor, different risk factors for pulmonary disease, and how the heart and lungs work together.     Nutrition for Pulmonary Patient:  -Group instruction provided by PowerPoint slides, verbal discussion, and written materials to support subject matter. The instructor gives an explanation and review of healthy diet recommendations, which includes a discussion on weight management, recommendations for fruit and vegetable consumption, as well as protein, fluid, caffeine, fiber, sodium, sugar, and alcohol. Tips for eating when patients are short of breath are discussed.   Pursed Lip Breathing:  -Group instruction that is supported by demonstration and informational handouts. Instructor discusses the benefits of pursed lip and diaphragmatic breathing and detailed demonstration on how to preform both.     Oxygen Safety:  -Group instruction provided by PowerPoint, verbal discussion, and written material to support subject matter. There is an overview of "What is Oxygen" and "Why do we need it".  Instructor also reviews how to create a safe environment for oxygen use, the importance of using oxygen as prescribed, and the risks of noncompliance. There is a brief discussion on traveling with oxygen and resources the patient may utilize.   Oxygen Equipment:   -Group instruction provided by [facility] Staff utilizing handouts, written materials, and equipment demonstrations.   Signs and Symptoms:  -Group instruction provided by written material and verbal discussion to support subject matter. Warning signs and symptoms of infection, stroke, and heart attack are reviewed and when to call the physician/911 reinforced. Tips for preventing the spread of infection discussed.   Advanced Directives:  -Group instruction provided by verbal instruction and written material to support subject matter. Instructor reviews Advanced Directive laws and proper instruction for filling out document.   Pulmonary Video:  -Group video education that reviews the importance of medication and oxygen compliance, exercise, good nutrition, pulmonary hygiene, and pursed lip and diaphragmatic breathing for the pulmonary patient.   Exercise for the Pulmonary Patient:  -Group instruction that is supported by a PowerPoint presentation. Instructor discusses benefits of exercise, core components of exercise, frequency, duration, and intensity of an exercise routine, importance of utilizing pulse oximetry during exercise, safety while exercising, and options of places to exercise outside of rehab.     Pulmonary Medications:  -Verbally interactive group education provided by instructor with focus on inhaled medications and proper administration.   Anatomy and Physiology of the Respiratory System and Intimacy:  -Group instruction provided by PowerPoint, verbal discussion, and written material to support subject matter. Instructor reviews respiratory cycle and anatomical components of the respiratory system and their functions. Instructor also reviews differences in obstructive and restrictive respiratory diseases with examples of each. Intimacy, Sex, and Sexuality differences are reviewed with a discussion on how relationships can change when diagnosed with pulmonary disease. Common  sexual concerns  are reviewed.   MD DAY -A group question and answer session with a medical doctor that allows participants to ask questions that relate to their pulmonary disease state.   OTHER EDUCATION -Group or individual verbal, written, or video instructions that support the educational goals of the pulmonary rehab program.   Holiday Eating Survival Tips:  -Group instruction provided by PowerPoint slides, verbal discussion, and written materials to support subject matter. The instructor gives patients tips, tricks, and techniques to help them not only survive but enjoy the holidays despite the onslaught of food that accompanies the holidays.   Knowledge Questionnaire Score:   Core Components/Risk Factors/Patient Goals at Admission: Personal Goals and Risk Factors at Admission - 01/28/18 1023      Core Components/Risk Factors/Patient Goals on Admission    Weight Management  Obesity;Weight Loss    Improve shortness of breath with ADL's  Yes    Intervention  Provide education, individualized exercise plan and daily activity instruction to help decrease symptoms of SOB with activities of daily living.    Expected Outcomes  Short Term: Improve cardiorespiratory fitness to achieve a reduction of symptoms when performing ADLs;Long Term: Be able to perform more ADLs without symptoms or delay the onset of symptoms    Hypertension  Yes    Intervention  Provide education on lifestyle modifcations including regular physical activity/exercise, weight management, moderate sodium restriction and increased consumption of fresh fruit, vegetables, and low fat dairy, alcohol moderation, and smoking cessation.;Monitor prescription use compliance.    Expected Outcomes  Short Term: Continued assessment and intervention until BP is < 140/83mm HG in hypertensive participants. < 130/56mm HG in hypertensive participants with diabetes, heart failure or chronic kidney disease.;Long Term: Maintenance of blood  pressure at goal levels.    Lipids  Yes    Intervention  Provide education and support for participant on nutrition & aerobic/resistive exercise along with prescribed medications to achieve LDL 70mg , HDL >40mg .    Expected Outcomes  Short Term: Participant states understanding of desired cholesterol values and is compliant with medications prescribed. Participant is following exercise prescription and nutrition guidelines.;Long Term: Cholesterol controlled with medications as prescribed, with individualized exercise RX and with personalized nutrition plan. Value goals: LDL < 70mg , HDL > 40 mg.    Stress  Yes    Intervention  Offer individual and/or small group education and counseling on adjustment to heart disease, stress management and health-related lifestyle change. Teach and support self-help strategies.;Refer participants experiencing significant psychosocial distress to appropriate mental health specialists for further evaluation and treatment. When possible, include family members and significant others in education/counseling sessions.    Expected Outcomes  Short Term: Participant demonstrates changes in health-related behavior, relaxation and other stress management skills, ability to obtain effective social support, and compliance with psychotropic medications if prescribed.;Long Term: Emotional wellbeing is indicated by absence of clinically significant psychosocial distress or social isolation.       Core Components/Risk Factors/Patient Goals Review:  Goals and Risk Factor Review    Row Name 02/11/18 1534             Core Components/Risk Factors/Patient Goals Review   Personal Goals Review  Lipids;Hypertension;Stress;Weight Management/Obesity       Review  Pt will begin pulmonary rehab on 02/12/18. Unable to assess pt progress at this time.  anticipate pt will make some progress toward his goals in the next 30 days.       Expected Outcomes  see admission outcomes  Core  Components/Risk Factors/Patient Goals at Discharge (Final Review):  Goals and Risk Factor Review - 02/11/18 1534      Core Components/Risk Factors/Patient Goals Review   Personal Goals Review  Lipids;Hypertension;Stress;Weight Management/Obesity    Review  Pt will begin pulmonary rehab on 02/12/18. Unable to assess pt progress at this time.  anticipate pt will make some progress toward his goals in the next 30 days.    Expected Outcomes  see admission outcomes       ITP Comments: ITP Comments    Row Name 01/28/18 1009 02/11/18 1533         ITP Comments  Dr. Jennet Maduro  Dr. Jennet Maduro         Comments: Pt has completed 1 exercise session. Cherre Huger, BSN Cardiac and Training and development officer

## 2018-02-11 NOTE — Patient Instructions (Addendum)
It is nice to meet you today. We will do a therapeutic trial with Trelegy Use 1 puff once daily Rinse mouth after use. Call us if you decide you would like a prescription. Continue on CPAP at bedtime. You appear to be benefiting from the treatment Goal is to wear for at least 6 hours each night for maximal clinical benefit. Continue to work on weight loss, as the link between excess weight  and sleep apnea is well established.  Do not drive if sleepy. Remember to clean mask, tubing, filter, and reservoir once weekly with soapy water. Please call [facility] for an appointment to bring in CPAP machine for down Load.[phone])  Follow up with Dr. [name]  In 6 months  or before as needed.  Please contact office for sooner follow up if symptoms do not improve or worsen or seek emergency care

## 2018-02-11 NOTE — Assessment & Plan Note (Signed)
Compliant with CPAP per patient history Plan: Continue on CPAP at bedtime. You appear to be benefiting from the treatment Goal is to wear for at least 6 hours each night for maximal clinical benefit. Continue to work on weight loss, as the link between excess weight  and sleep apnea is well established.  Do not drive if sleepy. Remember to clean mask, tubing, filter, and reservoir once weekly with soapy water. Please call [facility] for an appointment to bring in CPAP machine for down Load.[phone])  Follow up with Dr. [name]  In 6 months  or before as needed.  Please contact office for sooner follow up if symptoms do not improve or worsen or seek emergency care

## 2018-02-11 NOTE — Progress Notes (Signed)
History of Present Illness [name] is a 84 y.o. male former smoker with evidence of diaphragm weakness as well as COPD, OSA on CPAP.He is followed by Dr. Lake [name].  02/11/2018 6 month follow up: Pt. Presents for 6 month follow up. He was last seen 07/2017 after being started on Anoro for exertional dyspnea. He states he has been complaint with his Anoro. He states he does not think it is helping him, so he is not going to continue therapy.He does have exertional exertion. He is agreeable to a therapeutic trial with Trelegy. He is starting his pulmonary rehab tomorrow. He states he does not have any cough or secretions. He states he is compliant with his CPAP therapy every night with oxygen. Marland KitchenHe is unsure of the oxygen flow.He has albuterol nebs but he has  not used them in over a year. He states they noted irregular heart rate in Pulmonary rehab orientation. He has history of atrial fib, but had an ablation in 2011. He is set up for monitoring.    Consider CPAP download>> Will need to take machine to [facility]   Test Results: Spirometry 07/23/2017>>Very severe restriction.  PFT PFT's 08/2014: FEV1 0.92 (31%), ratio 55, decrease in muscle pressures, mild restriction, could not do DLCO maneuver.  CXR, CT abdomen, and diaphragm fluoroscopy images indicate mild left hemidiaphragm elevation .  CBC Latest Ref Rng & Units 10/14/2011 04/06/2011 04/05/2011  WBC 4.5 - 10.5 K/uL 5.6 9.1 10.7(H)  Hemoglobin 13.0 - 17.0 g/dL 14.6 13.6 13.5  Hematocrit 39.0 - 52.0 % 42.8 39.8 39.8  Platelets 150.0 - 400.0 K/uL 142.0(L) 206 219    BMP Latest Ref Rng & Units 12/17/2017 12/02/2017 11/24/2017  Glucose 65 - 99 mg/dL 96 105(H) 94  BUN 8 - 27 mg/dL 35(H) 56(H) 53(H)  Creatinine 0.76 - 1.27 mg/dL 1.21 1.42(H) 1.46(H)  BUN/Creat Ratio 10 - 24 29(H) 39(H) 36(H)  Sodium 134 - 144 mmol/L 141 141 139  Potassium 3.5 - 5.2 mmol/L 5.3(H) 4.9 5.4(H)  Chloride 96 - 106 mmol/L 106 107(H) 107(H)  CO2 20  - 29 mmol/L 22 20 19(L)  Calcium 8.6 - 10.2 mg/dL 9.1 9.0 9.7    BNP No results found for: BNP  ProBNP    Component Value Date/Time   PROBNP 7150.0 (H) 04/03/2011 0127    PFT    Component Value Date/Time   FEV1PRE 0.92 08/25/2014 1056   FEV1POST 1.04 08/25/2014 1056   FVCPRE 1.68 08/25/2014 1056   FVCPOST 1.72 08/25/2014 1056   TLC 4.51 08/25/2014 1056   DLCOUNC 5.24 08/25/2014 1056   PREFEV1FVCRT 55 08/25/2014 1056   PSTFEV1FVCRT 61 08/25/2014 1056    No results found.   Past medical hx Past Medical History:  Diagnosis Date  . BPH (benign prostatic hyperplasia)   . Coronary artery ectasia    Mild CAD with normal systolic function per cath in August of 2011  . Diaphragmatic paralysis    felt to be partially responsible for dyspnea  . Dyspnea    due to paralyzed diaphragm and pulmonary issues  . Gout   . HTN (hypertension)   . Hypercholesteremia   . Microhematuria   . Obesity   . OSA (obstructive sleep apnea)   . Paroxysmal atrial fibrillation (HCC)    occured in the setting of acute E Coli sepsis and ileius 7/12  . PVC (premature ventricular contraction)    s/p PVC ablation 05/29/2010     Social History   Tobacco Use  .  Smoking status: Former Smoker    Packs/day: 3.00    Years: 20.00    Pack years: 60.00    Types: Cigarettes    Last attempt to quit: 09/09/1984    Years since quitting: 33.4  . Smokeless tobacco: Never Used  Substance Use Topics  . Alcohol use: Yes    Alcohol/week: 0.0 oz    Comment: 1 glass of wine per week  . Drug use: No    Mr.[name] reports that he quit smoking about 33 years ago. His smoking use included cigarettes. He has a 60.00 pack-year smoking history. He has never used smokeless tobacco. He reports that he drinks alcohol. He reports that he does not use drugs.  Tobacco Cessation: Former smoker, quit 1986 with  A 60 pack year smoking history  Past surgical hx, Family hx, Social hx all reviewed.  Current Outpatient  Medications on File Prior to Visit  Medication Sig  . allopurinol (ZYLOPRIM) 300 MG tablet Take 300 mg by mouth daily.    Marland Kitchen amLODipine (NORVASC) 5 MG tablet Take 5 mg by mouth daily.  [name] ELLIPTA 62.5-25 MCG/INH AEPB INHALE 1 PUFF BY MOUTH EVERY DAY  . aspirin 81 MG tablet Take 81 mg by mouth daily.    Marland Kitchen buPROPion (WELLBUTRIN SR) 100 MG 12 hr tablet Take 100 mg by mouth 2 (two) times daily.  Marland Kitchen dicyclomine (BENTYL) 20 MG tablet Take 20 mg by mouth 2 (two) times daily.   Marland Kitchen doxazosin (CARDURA) 4 MG tablet Take 1 tablet (4 mg total) by mouth daily.  . hydrALAZINE (APRESOLINE) 25 MG tablet Take 1 tablet (25 mg total) by mouth 2 (two) times daily.  Marland Kitchen ibuprofen (ADVIL,MOTRIN) 200 MG tablet Take 200-400 mg by mouth every 6 (six) hours as needed (body aches). For pain  . lisinopril (PRINIVIL,ZESTRIL) 40 MG tablet Take 40 mg by mouth daily.   Marland Kitchen lovastatin (MEVACOR) 40 MG tablet Take 40 mg by mouth at bedtime.    . metoprolol succinate (TOPROL-XL) 25 MG 24 hr tablet Take 25 mg by mouth 2 (two) times daily.   . sertraline (ZOLOFT) 100 MG tablet Take 100 mg by mouth daily.  . traZODone (DESYREL) 100 MG tablet Take 100 mg by mouth at bedtime.    Marland Kitchen umeclidinium-vilanterol (ANORO ELLIPTA) 62.5-25 MCG/INH AEPB Inhale 1 puff daily into the lungs. (Patient not taking: Reported on 02/11/2018)   No current facility-administered medications on file prior to visit.      No Known Allergies  Review Of Systems:  Constitutional:   No  weight loss, night sweats,  Fevers, chills, + fatigue, or  lassitude.  HEENT:   No headaches,  Difficulty swallowing,  Tooth/dental problems, or  Sore throat,                No sneezing, itching, ear ache, nasal congestion, post nasal drip,   CV:  No chest pain,  Orthopnea, PND, swelling in lower extremities, anasarca, dizziness, palpitations, syncope.   GI  No heartburn, indigestion, abdominal pain, nausea, vomiting, diarrhea, change in bowel habits, loss of appetite, bloody  stools.   Resp: + shortness of breath with exertion less at rest.  No excess mucus, no productive cough,  No non-productive cough,  No coughing up of blood.  No change in color of mucus.  No wheezing.  No chest wall deformity  Skin: no rash or lesions.  GU: no dysuria, change in color of urine, no urgency or frequency.  No flank pain, no hematuria  MS:  No joint pain or swelling.  No decreased range of motion.  No back pain.  Psych:  No change in mood or affect. No depression or anxiety.  No memory loss.   Vital Signs BP 120/60 (BP Location: Right Arm, Cuff Size: Normal)   Pulse 63   Ht 5\' 10"  (1.778 m)   Wt 220 lb 3.2 oz (99.9 kg)   SpO2 92%   BMI 31.60 kg/m    Physical Exam:  General- No distress,  A&Ox3, pleasant and appropriate ENT: No sinus tenderness, TM clear, pale nasal mucosa, no oral exudate,no post nasal drip, no LAN Cardiac: S1, S2, irregular rate and rhythm, no murmur Chest: No wheeze/ rales/ dullness; no accessory muscle use, no nasal flaring, no sternal retractions, diminished per bases Abd.: Soft Non-tender, ND, BS +, Body mass index is 31.6 kg/m. Ext: No clubbing cyanosis, edema Neuro:  Deconditioned at baseline, MAE x 4, A&O x 3, Walks with a cane Skin: No rashes, warm and dry Psych: normal mood and behavior   Assessment/Plan  COPD (chronic obstructive pulmonary disease) with emphysema ([place]) Did not feel Anoro was helping, so is stopping it Plan: We will do a therapeutic trial with Trelegy Use 1 puff once daily Rinse mouth after use. Call us if you decide you would like a prescription.   OSA (obstructive sleep apnea) Compliant with CPAP per patient history Plan: Continue on CPAP at bedtime. You appear to be benefiting from the treatment Goal is to wear for at least 6 hours each night for maximal clinical benefit. Continue to work on weight loss, as the link between excess weight  and sleep apnea is well established.  Do not drive if  sleepy. Remember to clean mask, tubing, filter, and reservoir once weekly with soapy water. Please call [facility] for an appointment to bring in CPAP machine for down Load.[phone])  Follow up with Dr. Lake [name]  In 6 months  or before as needed.  Please contact office for sooner follow up if symptoms do not improve or worsen or seek emergency care      Magdalen Spatz, NP 02/11/2018  4:09 PM

## 2018-02-12 ENCOUNTER — Encounter (HOSPITAL_COMMUNITY)
Admission: RE | Admit: 2018-02-12 | Discharge: 2018-02-12 | Disposition: A | Discharge status: Home / Self Care | Payer: Medicare Other | Source: Ambulatory Visit | Attending: Pulmonary Disease | Admitting: Pulmonary Disease

## 2018-02-12 DIAGNOSIS — J432 Centrilobular emphysema: Secondary | ICD-10-CM

## 2018-02-12 NOTE — Progress Notes (Signed)
Daily Session Note  Patient Details  Name: [name] MRN: [id number] Date of Birth: [birth year] Referring Provider:     PULMONARY REHAB OTHER RESPIRATORY from 02/12/2018 in [facility]  Referring Provider  Dr. [name]      Encounter Date: 02/12/2018  Check In: Session Check In - 02/12/18 1123      Check-In   Location  [facility]    Staff Present  Training and development officer, MS, ACSM RCEP, Exercise Physiologist;Carlette Wilber Oliphant, Therapist, sports, BSN;Ramon Dredge, RN, MHA;Destenee Guerry Ysidro Evert, RN    Supervising physician immediately available to respond to emergencies  [facility] immediately available    Physician(s)  Dr. Tana Coast    Medication changes reported      No    Fall or balance concerns reported     No    Tobacco Cessation  No Change    Warm-up and Cool-down  Performed as group-led instruction    Resistance Training Performed  Yes    VAD Patient?  No      Pain Assessment   Currently in Pain?  No/denies    Multiple Pain Sites  No       Capillary Blood Glucose: No results found for this or any previous visit (from the past 24 hour(s)).    Social History   Tobacco Use  Smoking Status Former Smoker  . Packs/day: 3.00  . Years: 20.00  . Pack years: 60.00  . Types: Cigarettes  . Last attempt to quit: 09/09/1984  . Years since quitting: 33.4  Smokeless Tobacco Never Used    Goals Met:  Exercise tolerated well No report of cardiac concerns or symptoms Strength training completed today  Goals Unmet:  Not Applicable  Comments: Service time is from 1030 to 1230    Dr. Rush Farmer is Medical Director for Pulmonary Rehab at [facility].

## 2018-02-16 ENCOUNTER — Ambulatory Visit (INDEPENDENT_AMBULATORY_CARE_PROVIDER_SITE_OTHER): Payer: Medicare Other

## 2018-02-16 DIAGNOSIS — I493 Ventricular premature depolarization: Secondary | ICD-10-CM

## 2018-02-17 ENCOUNTER — Encounter (HOSPITAL_COMMUNITY)
Admission: RE | Admit: 2018-02-17 | Discharge: 2018-02-17 | Disposition: A | Discharge status: Home / Self Care | Payer: Medicare Other | Source: Ambulatory Visit | Attending: Pulmonary Disease | Admitting: Pulmonary Disease

## 2018-02-17 VITALS — Wt 223.1 lb

## 2018-02-17 DIAGNOSIS — J432 Centrilobular emphysema: Secondary | ICD-10-CM

## 2018-02-17 NOTE — Progress Notes (Signed)
Daily Session Note  Patient Details  Name: [name] MRN: [id number] Date of Birth: 13-Dec-1933 Referring Provider:     PULMONARY REHAB OTHER RESPIRATORY from 02/12/2018 in [facility]  Referring Provider  Dr. [name]      Encounter Date: 02/17/2018  Check In: Session Check In - 02/17/18 1030      Check-In   Location  [facility]    Staff Present  Rosebud Poles, RN, [facility], MS, ACSM RCEP, Exercise Physiologist;Lisa Ysidro Evert, Felipe Drone, RN, MHA;Olinty Celesta Aver, MS, ACSM CEP, Exercise Physiologist;Carlette Wilber Oliphant, RN, BSN    Supervising physician immediately available to respond to emergencies  [facility] immediately available    Physician(s)  Dr. Tana Coast    Medication changes reported      No    Fall or balance concerns reported     No    Tobacco Cessation  No Change    Warm-up and Cool-down  Performed as group-led instruction    Resistance Training Performed  Yes    VAD Patient?  No      Pain Assessment   Multiple Pain Sites  No       Capillary Blood Glucose: No results found for this or any previous visit (from the past 24 hour(s)).  Exercise Prescription Changes - 02/17/18 1200      Response to Exercise   Blood Pressure (Admit)  120/72    Blood Pressure (Exercise)  128/79    Blood Pressure (Exit)  122/66    Heart Rate (Admit)  65 bpm    Heart Rate (Exercise)  74 bpm    Heart Rate (Exit)  59 bpm    Oxygen Saturation (Admit)  93 %    Oxygen Saturation (Exercise)  92 %    Oxygen Saturation (Exit)  95 %    Rating of Perceived Exertion (Exercise)  13    Perceived Dyspnea (Exercise)  3    Duration  Progress to 45 minutes of aerobic exercise without signs/symptoms of physical distress    Intensity  Other (comment) 40-80% HRR      Progression   Progression  Continue to progress workloads to maintain intensity without signs/symptoms of physical distress.      Resistance Training   Training  Prescription  Yes    Weight  -- blue bands    Reps  10-15    Time  10 Minutes      Interval Training   Interval Training  No      Oxygen   Liters  2      NuStep   Level  3    SPM  80    Minutes  34    METs  1.7      Track   Laps  9    Minutes  17       Social History   Tobacco Use  Smoking Status Former Smoker  . Packs/day: 3.00  . Years: 20.00  . Pack years: 60.00  . Types: Cigarettes  . Last attempt to quit: 09/09/1984  . Years since quitting: 33.4  Smokeless Tobacco Never Used    Goals Met:  Exercise tolerated well Strength training completed today  Goals Unmet:  Not Applicable  Comments: Service time is from 1030 to 1225.    Dr. Rush Farmer is Medical Director for Pulmonary Rehab at [facility].

## 2018-02-19 ENCOUNTER — Encounter (HOSPITAL_COMMUNITY)
Admission: RE | Admit: 2018-02-19 | Discharge: 2018-02-19 | Disposition: A | Discharge status: Home / Self Care | Payer: Medicare Other | Source: Ambulatory Visit | Attending: Pulmonary Disease | Admitting: Pulmonary Disease

## 2018-02-19 DIAGNOSIS — J432 Centrilobular emphysema: Secondary | ICD-10-CM

## 2018-02-19 NOTE — Progress Notes (Signed)
Daily Session Note  Patient Details  Name: [name] MRN: [id number] Date of Birth: [birth year] Referring Provider:     PULMONARY REHAB OTHER RESPIRATORY from 02/12/2018 in [place]  Referring Provider  Dr. [name]      Encounter Date: 02/19/2018  Check In: Session Check In - 02/19/18 1030      Check-In   Location  [facility]    Staff Present  Rosebud Poles, RN, [facility], MS, ACSM RCEP, Exercise Physiologist;Lisa Ysidro Evert, RN;Carlette Wilber Oliphant, RN, BSN;Ramon Dredge, RN, [facility]    Supervising physician immediately available to respond to emergencies  [facility] immediately available    Physician(s)  Dr. Broadus John    Medication changes reported      No    Fall or balance concerns reported     No    Warm-up and Cool-down  Performed as group-led instruction    Resistance Training Performed  Yes    VAD Patient?  No      Pain Assessment   Currently in Pain?  No/denies    Multiple Pain Sites  No       Capillary Blood Glucose: No results found for this or any previous visit (from the past 24 hour(s)).    Social History   Tobacco Use  Smoking Status Former Smoker  . Packs/day: 3.00  . Years: 20.00  . Pack years: 60.00  . Types: Cigarettes  . Last attempt to quit: 09/09/1984  . Years since quitting: 33.4  Smokeless Tobacco Never Used    Goals Met:  Exercise tolerated well Strength training completed today  Goals Unmet:  Not Applicable  Comments: Service time is from 1030 to 1215    Dr. Rush Farmer is Medical Director for Pulmonary Rehab at [facility].

## 2018-02-23 ENCOUNTER — Telehealth: Payer: Self-pay | Admitting: Internal Medicine

## 2018-02-23 NOTE — Telephone Encounter (Signed)
I spoke with pt and told him we would call him with results once reviewed.  Pt is going out of town for vacation on 6/23 and would like to know results as soon as possible because he wants to make sure OK to go on vacation as planned.

## 2018-02-23 NOTE — Telephone Encounter (Signed)
Pt calling and want to know results of his Monitor

## 2018-02-24 ENCOUNTER — Encounter (HOSPITAL_COMMUNITY)
Admission: RE | Admit: 2018-02-24 | Discharge: 2018-02-24 | Disposition: A | Discharge status: Home / Self Care | Payer: Medicare Other | Source: Ambulatory Visit | Attending: Pulmonary Disease | Admitting: Pulmonary Disease

## 2018-02-24 VITALS — Wt 223.8 lb

## 2018-02-24 DIAGNOSIS — J432 Centrilobular emphysema: Secondary | ICD-10-CM

## 2018-02-24 NOTE — Progress Notes (Signed)
Daily Session Note  Patient Details  Name: [name] MRN: [id number] Date of Birth: December 31, 1933 Referring Provider:     PULMONARY REHAB OTHER RESPIRATORY from 02/12/2018 in [place]  Referring Provider  Dr. [name]      Encounter Date: 02/24/2018  Check In: Session Check In - 02/24/18 1030      Check-In   Location  [facility]    Staff Present  Rosebud Poles, RN, [facility], MS, ACSM RCEP, Exercise Physiologist;Lisa Ysidro Evert, Felipe Drone, RN, MHA;Carlette Wilber Oliphant, RN, BSN    Supervising physician immediately available to respond to emergencies  [facility] immediately available    Physician(s)  Dr. Broadus John    Medication changes reported      No    Fall or balance concerns reported     No    Tobacco Cessation  No Change    Warm-up and Cool-down  Performed as group-led instruction    Resistance Training Performed  Yes    VAD Patient?  No      Pain Assessment   Currently in Pain?  No/denies    Multiple Pain Sites  No       Capillary Blood Glucose: No results found for this or any previous visit (from the past 24 hour(s)).    Social History   Tobacco Use  Smoking Status Former Smoker  . Packs/day: 3.00  . Years: 20.00  . Pack years: 60.00  . Types: Cigarettes  . Last attempt to quit: 09/09/1984  . Years since quitting: 33.4  Smokeless Tobacco Never Used    Goals Met:  Exercise tolerated well Strength training completed today  Goals Unmet:  Not Applicable  Comments: Service time is from 1030 to 1210   Dr. Rush Farmer is Medical Director for Pulmonary Rehab at [facility].

## 2018-02-25 NOTE — Progress Notes (Signed)
[name] 84 y.o. male   DOB: 16-Jan-1934 MRN: [id number]          Nutrition 1. Centrilobular emphysema ([place])    Past Medical History:  Diagnosis Date  . BPH (benign prostatic hyperplasia)   . Coronary artery ectasia    Mild CAD with normal systolic function per cath in August of 2011  . Diaphragmatic paralysis    felt to be partially responsible for dyspnea  . Dyspnea    due to paralyzed diaphragm and pulmonary issues  . Gout   . HTN (hypertension)   . Hypercholesteremia   . Microhematuria   . Obesity   . OSA (obstructive sleep apnea)   . Paroxysmal atrial fibrillation (HCC)    occured in the setting of acute E Coli sepsis and ileius 7/12  . PVC (premature ventricular contraction)    s/p PVC ablation 05/29/2010   Meds reviewed.  Ht: Ht Readings from Last 1 Encounters:  02/11/18 5\' 10"  (1.778 m)     Wt:  Wt Readings from Last 3 Encounters:  02/24/18 223 lb 12.3 oz (101.5 kg)  02/17/18 223 lb 1.7 oz (101.2 kg)  02/11/18 220 lb 3.2 oz (99.9 kg)     BMI: 32.11    Current tobacco use? No  Labs:  Lipid Panel  No results found for: CHOL, TRIG, HDL, CHOLHDL, VLDL, LDLCALC, LDLDIRECT  No results found for: HGBA1C Note Spoke with pt. Pt is obese.  Pt eats 3 meals a day; most prepared at home.  Making healthy food choices the majority of the time.  Pt's Rate Your Plate results reviewed with pt. The role of sodium in lung disease reviewed with pt. Pt expressed understanding of the information reviewed.   Nutrition Diagnosis  ? Food-and nutrition-related knowledge deficit related to lack of exposure to information as related to diagnosis of pulmonary disease ? Obesity related to excessive energy intake as evidenced by a BMI of 32.11  Nutrition Intervention ? Pt's individual nutrition plan and goals reviewed with pt. ? Benefits of adopting healthy eating habits discussed when pt's Rate Your Plate reviewed.  Goal(s) 1. Pt to identify and limit food sources of  sodium.   Plan:  Pt to attend Pulmonary Nutrition class Will provide client-centered nutrition education as part of interdisciplinary care.   Monitor and evaluate progress toward nutrition goal with team.  Monitor and Evaluate progress toward nutrition goal with team.   Laurina Bustle, MS, RD, LDN 02/25/2018 4:23 PM

## 2018-02-25 NOTE — Telephone Encounter (Signed)
Please inform patient of results

## 2018-02-25 NOTE — Telephone Encounter (Signed)
Follow Up:    Pt would like his Monitor results asap please. Pt is scheduled to go out of town this weekend.

## 2018-02-25 NOTE — Telephone Encounter (Signed)
Returned call to Pt.  Pt given results of monitor. Advised Pt to keep follow up with [facility] for further information. Advised Pt ok to go on vacation this weekend.

## 2018-02-26 ENCOUNTER — Encounter (HOSPITAL_COMMUNITY)
Admission: RE | Admit: 2018-02-26 | Discharge: 2018-02-26 | Disposition: A | Discharge status: Home / Self Care | Payer: Medicare Other | Source: Ambulatory Visit | Attending: Pulmonary Disease | Admitting: Pulmonary Disease

## 2018-02-26 VITALS — Wt 223.3 lb

## 2018-02-26 DIAGNOSIS — J432 Centrilobular emphysema: Secondary | ICD-10-CM

## 2018-02-26 NOTE — Progress Notes (Signed)
Daily Session Note  Patient Details  Name: [name] MRN: [id number] Date of Birth: 05-Dec-1933 Referring Provider:     PULMONARY REHAB OTHER RESPIRATORY from 02/12/2018 in [name]  Referring Provider  Dr. [name]      Encounter Date: 02/26/2018  Check In: Session Check In - 02/26/18 1030      Check-In   Location  [facility]    Staff Present  Rosebud Poles, RN, [facility], MS, ACSM RCEP, Exercise Physiologist;Lisa Ysidro Evert, Felipe Drone, RN, [facility]    Supervising physician immediately available to respond to emergencies  [facility] immediately available    Physician(s)  Dr. Herbert Moors    Medication changes reported      No    Fall or balance concerns reported     No    Tobacco Cessation  No Change    Warm-up and Cool-down  Performed as group-led instruction    Resistance Training Performed  Yes    VAD Patient?  No      Pain Assessment   Currently in Pain?  No/denies    Multiple Pain Sites  No       Capillary Blood Glucose: No results found for this or any previous visit (from the past 24 hour(s)).    Social History   Tobacco Use  Smoking Status Former Smoker  . Packs/day: 3.00  . Years: 20.00  . Pack years: 60.00  . Types: Cigarettes  . Last attempt to quit: 09/09/1984  . Years since quitting: 33.4  Smokeless Tobacco Never Used    Goals Met:  Exercise tolerated well Strength training completed today  Goals Unmet:  Not Applicable  Comments: Service time is from 1030 to 1220    Dr. Rush Farmer is Medical Director for Pulmonary Rehab at [facility].

## 2018-02-27 ENCOUNTER — Telehealth: Payer: Self-pay | Admitting: Pulmonary Disease

## 2018-02-27 DIAGNOSIS — R0600 Dyspnea, unspecified: Secondary | ICD-10-CM

## 2018-02-27 MED ORDER — FLUTICASONE-UMECLIDIN-VILANT 100-62.5-25 MCG/INH IN AEPB: 1.0000 | INHALATION_SPRAY | Freq: Every day | RESPIRATORY_TRACT | 6 refills | Status: DC

## 2018-02-27 NOTE — Telephone Encounter (Signed)
Left message with Pulmonary Rehab, Cloyde Reams, to call back.  There is no order for O2 in Patient chart.  Patient does use [facility] for CPAP.  Will forward message to  Burman Nieves, to make her aware and to follow up.

## 2018-02-27 NOTE — Telephone Encounter (Signed)
Rx sent to pt's pharmacy.  Called pt letting him know this was done.  Pt expressed understanding. Nothing further needed.

## 2018-02-27 NOTE — Telephone Encounter (Signed)
Called Pulm Rehab and they are going to fax the 6MW to [place] to review.

## 2018-03-02 NOTE — Telephone Encounter (Signed)
Added the oxygen results from the 6MW and placed order for oxygen. Patient's oxygen dropped to 85 on room air and required 2 L of oxygen to maintain. Nothing further needed at this time. Will place the walk in BQ's look at before scanning.

## 2018-03-02 NOTE — Telephone Encounter (Signed)
Placed 6MW information in BQ's folder for review.

## 2018-03-02 NOTE — Telephone Encounter (Signed)
Will route to [employer].

## 2018-03-02 NOTE — Telephone Encounter (Signed)
Noted, thanks!

## 2018-03-03 ENCOUNTER — Encounter (HOSPITAL_COMMUNITY): Payer: Medicare Other

## 2018-03-04 ENCOUNTER — Ambulatory Visit: Payer: Medicare Other | Admitting: Nurse Practitioner

## 2018-03-05 ENCOUNTER — Encounter (HOSPITAL_COMMUNITY): Payer: Medicare Other

## 2018-03-10 ENCOUNTER — Encounter (HOSPITAL_COMMUNITY)
Admission: RE | Admit: 2018-03-10 | Discharge: 2018-03-10 | Disposition: A | Discharge status: Home / Self Care | Payer: Medicare Other | Source: Ambulatory Visit | Attending: Pulmonary Disease | Admitting: Pulmonary Disease

## 2018-03-10 VITALS — Wt 224.2 lb

## 2018-03-10 DIAGNOSIS — J432 Centrilobular emphysema: Secondary | ICD-10-CM | POA: Diagnosis not present

## 2018-03-10 NOTE — Progress Notes (Signed)
Pulmonary Individual Treatment Plan  Patient Details  Name: [name] MRN: [id number] Date of Birth: November 13, 1933 Referring Provider:     PULMONARY REHAB OTHER RESPIRATORY from 02/12/2018 in [place]  Referring Provider  Dr. [name]      Initial Encounter Date:    PULMONARY REHAB OTHER RESPIRATORY from 02/12/2018 in [place]  Date  02/05/18      Visit Diagnosis: Centrilobular emphysema ([place])  Patient's Home Medications on Admission:   Current Outpatient Medications:  .  allopurinol (ZYLOPRIM) 300 MG tablet, Take 300 mg by mouth daily.  , Disp: , Rfl:  .  amLODipine (NORVASC) 5 MG tablet, Take 5 mg by mouth daily., Disp: , Rfl: 6 .  ANORO ELLIPTA 62.5-25 MCG/INH AEPB, INHALE 1 PUFF BY MOUTH EVERY DAY, Disp: 60 each, Rfl: 2 .  aspirin 81 MG tablet, Take 81 mg by mouth daily.  , Disp: , Rfl:  .  buPROPion (WELLBUTRIN SR) 100 MG 12 hr tablet, Take 100 mg by mouth 2 (two) times daily., Disp: , Rfl:  .  dicyclomine (BENTYL) 20 MG tablet, Take 20 mg by mouth 2 (two) times daily. , Disp: , Rfl:  .  doxazosin (CARDURA) 4 MG tablet, Take 1 tablet (4 mg total) by mouth daily., Disp: 30 tablet, Rfl: 3 .  Fluticasone-Umeclidin-Vilant (TRELEGY ELLIPTA) 100-62.5-25 MCG/INH AEPB, Inhale 1 puff into the lungs daily., Disp: 1 each, Rfl: 6 .  hydrALAZINE (APRESOLINE) 25 MG tablet, Take 1 tablet (25 mg total) by mouth 2 (two) times daily., Disp: 60 tablet, Rfl: 6 .  ibuprofen (ADVIL,MOTRIN) 200 MG tablet, Take 200-400 mg by mouth every 6 (six) hours as needed (body aches). For pain, Disp: , Rfl:  .  lisinopril (PRINIVIL,ZESTRIL) 40 MG tablet, Take 40 mg by mouth daily. , Disp: , Rfl:  .  lovastatin (MEVACOR) 40 MG tablet, Take 40 mg by mouth at bedtime.  , Disp: , Rfl:  .  metoprolol succinate (TOPROL-XL) 25 MG 24 hr tablet, Take 25 mg by mouth 2 (two) times daily. , Disp: , Rfl:  .  sertraline (ZOLOFT) 100 MG tablet, Take 100 mg by  mouth daily., Disp: , Rfl: 5 .  traZODone (DESYREL) 100 MG tablet, Take 100 mg by mouth at bedtime.  , Disp: , Rfl:  .  umeclidinium-vilanterol (ANORO ELLIPTA) 62.5-25 MCG/INH AEPB, Inhale 1 puff daily into the lungs. (Patient not taking: Reported on 02/11/2018), Disp: 2 each, Rfl: 0  Past Medical History: Past Medical History:  Diagnosis Date  . BPH (benign prostatic hyperplasia)   . Coronary artery ectasia    Mild CAD with normal systolic function per cath in August of 2011  . Diaphragmatic paralysis    felt to be partially responsible for dyspnea  . Dyspnea    due to paralyzed diaphragm and pulmonary issues  . Gout   . HTN (hypertension)   . Hypercholesteremia   . Microhematuria   . Obesity   . OSA (obstructive sleep apnea)   . Paroxysmal atrial fibrillation (HCC)    occured in the setting of acute E Coli sepsis and ileius 7/12  . PVC (premature ventricular contraction)    s/p PVC ablation 05/29/2010    Tobacco Use: Social History   Tobacco Use  Smoking Status Former Smoker  . Packs/day: 3.00  . Years: 20.00  . Pack years: 60.00  . Types: Cigarettes  . Last attempt to quit: 09/09/1984  . Years since quitting: 95.5  Smokeless Tobacco Never Used    Labs: Recent Review Flowsheet Data    Labs for ITP Cardiac and Pulmonary Rehab Latest Ref Rng & Units 07/03/2010 04/02/2011   PHART 7.350 - 7.450 7.414 -   PCO2ART 35.0 - 45.0 mmHg 37.5 -   HCO3 20.0 - 24.0 mEq/L 23.5 -   TCO2 0 - 100 mmol/L 20.4 23   ACIDBASEDEF 0.0 - 2.0 mmol/L 0.2 -   O2SAT % 95.1 -      Capillary Blood Glucose: Lab Results  Component Value Date   GLUCAP 117 (H) 04/03/2011     Pulmonary Assessment Scores: Pulmonary Assessment Scores    Row Name 02/06/18 0859 02/17/18 0919       ADL UCSD   ADL Phase  Entry  Entry    SOB Score total  -  70      CAT Score   CAT Score  -  21      mMRC Score   mMRC Score  3  -       Pulmonary Function Assessment:   Exercise Target Goals:     Exercise Program Goal: Individual exercise prescription set using results from initial 6 min walk test and THRR while considering  patient's activity barriers and safety.   Exercise Prescription Goal: Initial exercise prescription builds to 30-45 minutes a day of aerobic activity, 2-3 days per week.  Home exercise guidelines will be given to patient during program as part of exercise prescription that the participant will acknowledge.  Activity Barriers & Risk Stratification:   6 Minute Walk: 6 Minute Walk    Row Name 02/06/18 0855 02/06/18 0857       6 Minute Walk   Phase  Initial  (Pended)   Initial    Distance  1049 feet  (Pended)   1049 feet    Walk Time  6 minutes  (Pended)   6 minutes    # of Rest Breaks  0  (Pended)   0    MPH  1.98  (Pended)   1.98    METS  -  2.53    RPE  -  12    Perceived Dyspnea   -  1    Symptoms  -  Yes (comment)    Comments  -  used rollator-no other complaints-zoll showed trigeminy-cardiologist made aware-sent strips    Resting HR  -  75 bpm    Resting BP  -  126/64    Resting Oxygen Saturation   -  90 %    Exercise Oxygen Saturation  during 6 min walk  -  85 %    Max Ex. HR  -  106 bpm    Max Ex. BP  -  140/68      Interval HR   1 Minute HR  -  89    2 Minute HR  -  86    3 Minute HR  -  103    4 Minute HR  -  106    5 Minute HR  -  103    6 Minute HR  -  104    2 Minute Post HR  -  94    Interval Heart Rate?  -  Yes      Interval Oxygen   Interval Oxygen?  -  Yes    Baseline Oxygen Saturation %  -  90 %    1 Minute Oxygen Saturation %  -  89 %  1 Minute Liters of Oxygen  -  0 L    2 Minute Oxygen Saturation %  -  86 %    2 Minute Liters of Oxygen  -  0 L    3 Minute Oxygen Saturation %  -  85 %    3 Minute Liters of Oxygen  -  0 L    4 Minute Oxygen Saturation %  -  89 %    4 Minute Liters of Oxygen  -  2 L    5 Minute Oxygen Saturation %  -  89 %    5 Minute Liters of Oxygen  -  2 L    6 Minute Oxygen Saturation %  -  90  %    6 Minute Liters of Oxygen  -  2 L    2 Minute Post Oxygen Saturation %  -  93 %    2 Minute Post Liters of Oxygen  -  2 L       Oxygen Initial Assessment: Oxygen Initial Assessment - 02/06/18 0854      Initial 6 min Walk   Oxygen Used  Continuous Patient not originally on 02. Desaturated to 85% on room air during walk test. 2 liters used. Will cont. to observe patient during exercise for oxygen needs.    Liters per minute  2      Program Oxygen Prescription   Program Oxygen Prescription  Continuous;E-Tanks    Liters per minute  2       Oxygen Re-Evaluation: Oxygen Re-Evaluation    [place] Name 02/11/18 1533 03/06/18 1040           Program Oxygen Prescription   Program Oxygen Prescription  Continuous;E-Tanks  Continuous;E-Tanks      Liters per minute  -  2        Home Oxygen   Home Oxygen Device  Home Concentrator  Home Concentrator      Sleep Oxygen Prescription  CPAP  CPAP      Home Exercise Oxygen Prescription  None  Continuous      Liters per minute  -  2      Home at Rest Exercise Oxygen Prescription  None  None      Compliance with Home Oxygen Use  Yes  Yes        Goals/Expected Outcomes   Short Term Goals  To learn and demonstrate proper use of respiratory medications;To learn and demonstrate proper pursed lip breathing techniques or other breathing techniques.;To learn and understand importance of monitoring SPO2 with pulse oximeter and demonstrate accurate use of the pulse oximeter.;To learn and understand importance of maintaining oxygen saturations>88%;To learn and exhibit compliance with exercise, home and travel O2 prescription  To learn and demonstrate proper use of respiratory medications;To learn and demonstrate proper pursed lip breathing techniques or other breathing techniques.;To learn and understand importance of monitoring SPO2 with pulse oximeter and demonstrate accurate use of the pulse oximeter.;To learn and understand importance of maintaining  oxygen saturations>88%;To learn and exhibit compliance with exercise, home and travel O2 prescription      Long  Term Goals  Exhibits compliance with exercise, home and travel O2 prescription;Verbalizes importance of monitoring SPO2 with pulse oximeter and return demonstration;Maintenance of O2 saturations>88%;Exhibits proper breathing techniques, such as pursed lip breathing or other method taught during program session;Compliance with respiratory medication;Demonstrates proper use of MDI's  Exhibits compliance with exercise, home and travel O2 prescription;Verbalizes importance of monitoring SPO2 with pulse oximeter  and return demonstration;Maintenance of O2 saturations>88%;Exhibits proper breathing techniques, such as pursed lip breathing or other method taught during program session;Compliance with respiratory medication;Demonstrates proper use of MDI's      Goals/Expected Outcomes  -  Compliance and understanding of new home oxygen systems         Oxygen Discharge (Final Oxygen Re-Evaluation): Oxygen Re-Evaluation - 03/06/18 1040      Program Oxygen Prescription   Program Oxygen Prescription  Continuous;E-Tanks    Liters per minute  2      Home Oxygen   Home Oxygen Device  Home Concentrator    Sleep Oxygen Prescription  CPAP    Home Exercise Oxygen Prescription  Continuous    Liters per minute  2    Home at Rest Exercise Oxygen Prescription  None    Compliance with Home Oxygen Use  Yes      Goals/Expected Outcomes   Short Term Goals  To learn and demonstrate proper use of respiratory medications;To learn and demonstrate proper pursed lip breathing techniques or other breathing techniques.;To learn and understand importance of monitoring SPO2 with pulse oximeter and demonstrate accurate use of the pulse oximeter.;To learn and understand importance of maintaining oxygen saturations>88%;To learn and exhibit compliance with exercise, home and travel O2 prescription    Long  Term Goals   Exhibits compliance with exercise, home and travel O2 prescription;Verbalizes importance of monitoring SPO2 with pulse oximeter and return demonstration;Maintenance of O2 saturations>88%;Exhibits proper breathing techniques, such as pursed lip breathing or other method taught during program session;Compliance with respiratory medication;Demonstrates proper use of MDI's    Goals/Expected Outcomes  Compliance and understanding of new home oxygen systems       Initial Exercise Prescription: Initial Exercise Prescription - 02/12/18 1000      Date of Initial Exercise RX and Referring Provider   Date  02/05/18    Referring Provider  Dr. [name]      Oxygen   Oxygen  Continuous    Liters  2      NuStep   Level  2    SPM  80    Minutes  17    METs  1.5      Track   Laps  5    Minutes  17      Prescription Details   Frequency (times per week)  2    Duration  Progress to 45 minutes of aerobic exercise without signs/symptoms of physical distress      Intensity   THRR 40-80% of Max Heartrate  55-110    Ratings of Perceived Exertion  11-13    Perceived Dyspnea  0-4      Progression   Progression  Continue progressive overload as per policy without signs/symptoms or physical distress.      Resistance Training   Training Prescription  Yes    Weight  blue bands    Reps  10-15       Perform Capillary Blood Glucose checks as needed.  Exercise Prescription Changes: Exercise Prescription Changes    Row Name 02/17/18 1200 02/26/18 1121 03/10/18 1200         Response to Exercise   Blood Pressure (Admit)  120/72  130/80  -     Blood Pressure (Exercise)  128/79  120/70  -     Blood Pressure (Exit)  122/66  138/64  -     Heart Rate (Admit)  65 bpm  63 bpm  -     Heart Rate (  Exercise)  74 bpm  84 bpm  -     Heart Rate (Exit)  59 bpm  62 bpm  -     Oxygen Saturation (Admit)  93 %  91 %  -     Oxygen Saturation (Exercise)  92 %  92 %  -     Oxygen Saturation (Exit)  95 %  93 %  -      Rating of Perceived Exertion (Exercise)  13  11  -     Perceived Dyspnea (Exercise)  3  2  -     Duration  Progress to 45 minutes of aerobic exercise without signs/symptoms of physical distress  Progress to 45 minutes of aerobic exercise without signs/symptoms of physical distress  -     Intensity  Other (comment) 40-80% HRR  THRR unchanged  -       Progression   Progression  Continue to progress workloads to maintain intensity without signs/symptoms of physical distress.  Continue to progress workloads to maintain intensity without signs/symptoms of physical distress.  -       Resistance Training   Training Prescription  Yes  Yes  -     Weight  - blue bands  blue bands  -     Reps  10-15  10-15  -     Time  10 Minutes  10 Minutes  -       Interval Training   Interval Training  No  No  -       Oxygen   Liters  2  2  -       NuStep   Level  3  3  -     SPM  80  80  -     Minutes  34  17  -     METs  1.7  2.2  -       Track   Laps  9  11  -     Minutes  17  17  -       Home Exercise Plan   Plans to continue exercise at  -  -  Home (comment)     Frequency  -  -  Add 2 additional days to program exercise sessions.        Exercise Comments: Exercise Comments    Row Name 03/10/18 1245           Exercise Comments  Home exercise changes          Exercise Goals and Review: Exercise Goals    Row Name 01/28/18 1113             Exercise Goals   Increase Physical Activity  Yes       Intervention  Provide advice, education, support and counseling about physical activity/exercise needs.;Develop an individualized exercise prescription for aerobic and resistive training based on initial evaluation findings, risk stratification, comorbidities and participant's personal goals.       Expected Outcomes  Short Term: Attend rehab on a regular basis to increase amount of physical activity.;Long Term: Add in home exercise to make exercise part of routine and to increase amount of  physical activity.;Long Term: Exercising regularly at least 3-5 days a week.       Increase Strength and Stamina  Yes       Intervention  Provide advice, education, support and counseling about physical activity/exercise needs.;Develop an individualized exercise prescription for aerobic and resistive training based on initial  evaluation findings, risk stratification, comorbidities and participant's personal goals.       Expected Outcomes  Short Term: Increase workloads from initial exercise prescription for resistance, speed, and METs.;Short Term: Perform resistance training exercises routinely during rehab and add in resistance training at home;Long Term: Improve cardiorespiratory fitness, muscular endurance and strength as measured by increased METs and functional capacity (6MWT)       Able to understand and use rate of perceived exertion (RPE) scale  Yes       Intervention  Provide education and explanation on how to use RPE scale       Expected Outcomes  Long Term:  Able to use RPE to guide intensity level when exercising independently;Short Term: Able to use RPE daily in rehab to express subjective intensity level       Able to understand and use Dyspnea scale  Yes       Intervention  Provide education and explanation on how to use Dyspnea scale       Expected Outcomes  Short Term: Able to use Dyspnea scale daily in rehab to express subjective sense of shortness of breath during exertion;Long Term: Able to use Dyspnea scale to guide intensity level when exercising independently       Knowledge and understanding of Target Heart Rate Range (THRR)  Yes       Intervention  Provide education and explanation of THRR including how the numbers were predicted and where they are located for reference       Expected Outcomes  Short Term: Able to state/look up THRR;Long Term: Able to use THRR to govern intensity when exercising independently;Short Term: Able to use daily as guideline for intensity in rehab        Understanding of Exercise Prescription  Yes       Intervention  Provide education, explanation, and written materials on patient's individual exercise prescription       Expected Outcomes  Short Term: Able to explain program exercise prescription;Long Term: Able to explain home exercise prescription to exercise independently          Exercise Goals Re-Evaluation : Exercise Goals Re-Evaluation    Row Name 03/06/18 1041             Exercise Goal Re-Evaluation   Exercise Goals Review  Increase Physical Activity;Able to understand and use rate of perceived exertion (RPE) scale;Knowledge and understanding of Target Heart Rate Range (THRR);Understanding of Exercise Prescription;Increase Strength and Stamina;Able to understand and use Dyspnea scale;Able to check pulse independently       Comments  Patient has attended 5 rehab sessions. Progress will be slow and probably not steady. Will cont. to monitor and progress as able. Has been out for 1 week on vacation.       Expected Outcomes  Through exercise at rehab and at home, the patient will decrease shortness of breath with daily activities and feel confident in carrying out an exercise regime at home.           Discharge Exercise Prescription (Final Exercise Prescription Changes): Exercise Prescription Changes - 03/10/18 1200      Home Exercise Plan   Plans to continue exercise at  Home (comment)    Frequency  Add 2 additional days to program exercise sessions.       Nutrition:  Target Goals: Understanding of nutrition guidelines, daily intake of sodium <1556m, cholesterol <2064m calories 30% from fat and 7% or less from saturated fats, daily to have 5 or more  servings of fruits and vegetables.  Biometrics:    Nutrition Therapy Plan and Nutrition Goals: Nutrition Therapy & Goals - 03/04/18 1705      Nutrition Therapy   Diet  Heart Healthy      Personal Nutrition Goals   Nutrition Goal  Pt to identify and limit food sources  of sodium.      Intervention Plan   Intervention  Prescribe, educate and counsel regarding individualized specific dietary modifications aiming towards targeted core components such as weight, hypertension, lipid management, diabetes, heart failure and other comorbidities.    Expected Outcomes  Short Term Goal: Understand basic principles of dietary content, such as calories, fat, sodium, cholesterol and nutrients.;Long Term Goal: Adherence to prescribed nutrition plan.       Nutrition Assessments: Nutrition Assessments - 02/05/18 1400      Rate Your Plate Scores   Pre Score  53       Nutrition Goals Re-Evaluation:   Nutrition Goals Discharge (Final Nutrition Goals Re-Evaluation):   Psychosocial: Target Goals: Acknowledge presence or absence of significant depression and/or stress, maximize coping skills, provide positive support system. Participant is able to verbalize types and ability to use techniques and skills needed for reducing stress and depression.  Initial Review & Psychosocial Screening: Initial Psych Review & Screening - 01/28/18 1026      Initial Review   Current issues with  Current Depression;History of Depression;Current Anxiety/Panic;Current Psychotropic Meds;Current Stress Concerns;Current Sleep Concerns    Source of Stress Concerns  Chronic Illness;Unable to participate in former interests or hobbies;Unable to perform yard/household activities    Comments  fearful of falling      [place]?  Yes      Barriers   Psychosocial barriers to participate in program  The patient should benefit from training in stress management and relaxation.      Screening Interventions   Interventions  Encouraged to exercise    Expected Outcomes  Short Term goal: Identification and review with participant of any Quality of Life or Depression concerns found by scoring the questionnaire.;Long Term Goal: Stressors or current issues are controlled or  eliminated.       Quality of Life Scores:  Scores of 19 and below usually indicate a poorer quality of life in these areas.  A difference of  2-3 points is a clinically meaningful difference.  A difference of 2-3 points in the total score of the Quality of Life Index has been associated with significant improvement in overall quality of life, self-image, physical symptoms, and general health in studies assessing change in quality of life.   PHQ-9: Recent Review Flowsheet Data    Depression screen [facility] 2/9 01/28/2018 05/31/2015   Decreased Interest 0 0   Down, Depressed, Hopeless 1 0   PHQ - 2 Score 1 0   Altered sleeping 2 -   Tired, decreased energy 1 -   Feeling bad or failure about yourself  1 -   Trouble concentrating 1 -   Moving slowly or fidgety/restless 0 -   Suicidal thoughts 0 -   PHQ-9 Score 6 -   Difficult doing work/chores Somewhat difficult -     Interpretation of Total Score  Total Score Depression Severity:  1-4 = Minimal depression, 5-9 = Mild depression, 10-14 = Moderate depression, 15-19 = Moderately severe depression, 20-27 = Severe depression   Psychosocial Evaluation and Intervention: Psychosocial Evaluation - 02/11/18 1538      Psychosocial Evaluation & Interventions  Comments  Pt with anxiety regarding falling    Expected Outcomes  Pt will be free of any psychosocial needs       Psychosocial Re-Evaluation: Psychosocial Re-Evaluation    [place] Name 03/09/18 1415             Psychosocial Re-Evaluation   Current issues with  Current Stress Concerns       Comments  Going to see Truitt Merle July 3 to get results of holter monitor, this has been a stressor, when out of town last week and was told by MD it was ok to go       Expected Outcomes  Continue to support re: fear of falling       Continue Psychosocial Services   No Follow up required       Comments  fearful of falling         Initial Review   Source of Stress Concerns  Chronic  Illness;Unable to participate in former interests or hobbies;Unable to perform yard/household activities          Psychosocial Discharge (Final Psychosocial Re-Evaluation): Psychosocial Re-Evaluation - 03/09/18 1415      Psychosocial Re-Evaluation   Current issues with  Current Stress Concerns    Comments  Going to see Truitt Merle July 3 to get results of holter monitor, this has been a stressor, when out of town last week and was told by MD it was ok to go    Expected Outcomes  Continue to support re: fear of falling    Continue Psychosocial Services   No Follow up required    Comments  fearful of falling      Initial Review   Source of Stress Concerns  Chronic Illness;Unable to participate in former interests or hobbies;Unable to perform yard/household activities        Education: Education Goals: Education classes will be provided on a weekly basis, covering required topics. Participant will state understanding/return demonstration of topics presented.  Learning Barriers/Preferences: Learning Barriers/Preferences - 01/28/18 1029      Learning Barriers/Preferences   Learning Barriers  Sight    Learning Preferences  Computer/Internet;Verbal Instruction       Education Topics: How Lungs Work and Diseases: - Discuss the anatomy of the lungs and diseases that can affect the lungs, such as COPD.   Exercise: -Discuss the importance of exercise, FITT principles of exercise, normal and abnormal responses to exercise, and how to exercise safely.   Environmental Irritants: -Discuss types of environmental irritants and how to limit exposure to environmental irritants.   Meds/Inhalers and oxygen: - Discuss respiratory medications, definition of an inhaler and oxygen, and the proper way to use an inhaler and oxygen.   Energy Saving Techniques: - Discuss methods to conserve energy and decrease shortness of breath when performing activities of daily living.    Bronchial  Hygiene / Breathing Techniques: - Discuss breathing mechanics, pursed-lip breathing technique,  proper posture, effective ways to clear airways, and other functional breathing techniques   Cleaning Equipment: - Provides group verbal and written instruction about the health risks of elevated stress, cause of high stress, and healthy ways to reduce stress.   Nutrition I: Fats: - Discuss the types of cholesterol, what cholesterol does to the body, and how cholesterol levels can be controlled.   Nutrition II: Labels: -Discuss the different components of food labels and how to read food labels.   Respiratory Infections: - Discuss the signs and symptoms of respiratory infections, ways to  prevent respiratory infections, and the importance of seeking medical treatment when having a respiratory infection.   Stress I: Signs and Symptoms: - Discuss the causes of stress, how stress may lead to anxiety and depression, and ways to limit stress.   Stress II: Relaxation: -Discuss relaxation techniques to limit stress.   Oxygen for Home/Travel: - Discuss how to prepare for travel when on oxygen and proper ways to transport and store oxygen to ensure safety.   Knowledge Questionnaire Score: Knowledge Questionnaire Score - 02/17/18 0918      Knowledge Questionnaire Score   Pre Score  14/18       Core Components/Risk Factors/Patient Goals at Admission: Personal Goals and Risk Factors at Admission - 01/28/18 1023      Core Components/Risk Factors/Patient Goals on Admission    Weight Management  Obesity;Weight Loss    Improve shortness of breath with ADL's  Yes    Intervention  Provide education, individualized exercise plan and daily activity instruction to help decrease symptoms of SOB with activities of daily living.    Expected Outcomes  Short Term: Improve cardiorespiratory fitness to achieve a reduction of symptoms when performing ADLs;Long Term: Be able to perform more ADLs without  symptoms or delay the onset of symptoms    Hypertension  Yes    Intervention  Provide education on lifestyle modifcations including regular physical activity/exercise, weight management, moderate sodium restriction and increased consumption of fresh fruit, vegetables, and low fat dairy, alcohol moderation, and smoking cessation.;Monitor prescription use compliance.    Expected Outcomes  Short Term: Continued assessment and intervention until BP is < 140/61m HG in hypertensive participants. < 130/825mHG in hypertensive participants with diabetes, heart failure or chronic kidney disease.;Long Term: Maintenance of blood pressure at goal levels.    Lipids  Yes    Intervention  Provide education and support for participant on nutrition & aerobic/resistive exercise along with prescribed medications to achieve LDL <7025mHDL >63m27m  Expected Outcomes  Short Term: Participant states understanding of desired cholesterol values and is compliant with medications prescribed. Participant is following exercise prescription and nutrition guidelines.;Long Term: Cholesterol controlled with medications as prescribed, with individualized exercise RX and with personalized nutrition plan. Value goals: LDL < 70mg88mL > 40 mg.    Stress  Yes    Intervention  Offer individual and/or small group education and counseling on adjustment to heart disease, stress management and health-related lifestyle change. Teach and support self-help strategies.;Refer participants experiencing significant psychosocial distress to appropriate mental health specialists for further evaluation and treatment. When possible, include family members and significant others in education/counseling sessions.    Expected Outcomes  Short Term: Participant demonstrates changes in health-related behavior, relaxation and other stress management skills, ability to obtain effective social support, and compliance with psychotropic medications if prescribed.;Long  Term: Emotional wellbeing is indicated by absence of clinically significant psychosocial distress or social isolation.       Core Components/Risk Factors/Patient Goals Review:  Goals and Risk Factor Review    Row Name 02/11/18 1534 03/09/18 1412           Core Components/Risk Factors/Patient Goals Review   Personal Goals Review  Lipids;Hypertension;Stress;Weight Management/Obesity  Weight Management/Obesity;Improve shortness of breath with ADL's;Hypertension;Stress;Lipids      Review  Pt will begin pulmonary rehab on 02/12/18. Unable to assess pt progress at this time.  anticipate pt will make some progress toward his goals in the next 30 days.  Attended 5 exercise sessions, on vacation  last week.  No weight loss yet, hypertension, stress,  and lipids controlled, walking 8-16 laps on the track, level 3 on nustep, progressing well      Expected Outcomes  see admission outcomes  see admission outcomes         Core Components/Risk Factors/Patient Goals at Discharge (Final Review):  Goals and Risk Factor Review - 03/09/18 1412      Core Components/Risk Factors/Patient Goals Review   Personal Goals Review  Weight Management/Obesity;Improve shortness of breath with ADL's;Hypertension;Stress;Lipids    Review  Attended 5 exercise sessions, on vacation last week.  No weight loss yet, hypertension, stress,  and lipids controlled, walking 8-16 laps on the track, level 3 on nustep, progressing well    Expected Outcomes  see admission outcomes       ITP Comments: ITP Comments    Row Name 01/28/18 1009 02/11/18 1533         ITP Comments  Dr. Jennet Maduro  Dr. Jennet Maduro         Comments: ITP REVIEW Pt is making expected progress toward pulmonary rehab goals after completing 6 sessions. Recommend continued exercise, life style modification, education, and utilization of breathing techniques to increase stamina and strength and decrease shortness of breath with exertion.

## 2018-03-10 NOTE — Progress Notes (Signed)
I have reviewed a Home Exercise Prescription with GURVIR SCHROM . Holger is not currently exercising at home.  The patient was advised to walk 2 days a week for 30 minutes.  Bently and I discussed how to progress their exercise prescription.  The patient stated that their goals were to decrease shortness of breath and lose 20lbs.  The patient stated that they understand the exercise prescription.  We reviewed exercise guidelines, target heart rate during exercise, RPE Scale, weather conditions, NTG use, endpoints for exercise, warmup and cool down.  Patient is encouraged to come to me with any questions. I will continue to follow up with the patient to assist them with progression and safety.

## 2018-03-10 NOTE — Progress Notes (Signed)
CARDIOLOGY OFFICE NOTE  Date:  03/11/2018    [name] Date of Birth: 07/18/1934 Medical Record #[id number]  PCP:  [name], MD  Cardiologist:  [name]    Chief Complaint  Patient presents with  . Atrial Fibrillation    Follow up visit - seen for Dr. [name]    History of Present Illness: [name] is a 84 y.o. male who presents today for a follow up visit. Seen for Dr. [name]. He has seen Dr. [name] in the remote past.   He has a h/o PVC ablation 05/29/10, PAF, obesity, sedentary lifestyle, HLD, gout, OSA treated with CPAP, &dysfunctional diaphragm with chronic dyspneapreviouslyfollowed by Dr. [name]. He has mild CAD per cath back in August of 2011. Labs are checked by primary care.   Seen back in February of 2016 by Dr. [name] - this was for evaluation of dyspnea. Dr. [name] wanted cardiac status reevaluated to assess for any cardiac issues that may be contributing to dyspnea. Was referred for Myoview and echocardiogram. These studies were satisfactory. Encouraged to cut back on his alcohol intake as well.   I have followed him since - he has been holding his own - dyspnea is chronic.He has gotten back with pulmonary - now seeing Dr. [name].Balance issues and has had some falls. Rhythm has been stablefortunately.When seen back in the fall - BP was up - Aldactone was added. Was doing well at his last visit back in February of this year. Balance still an issue.   Received notification from rehab about his rhythm last month - holter was obtained - see below.   Comes back today. Here alone. He was in [name] last week - he did ok. He was not out as much as his family. He could not understand his monitor results. He did a program earlier this year for his balance. No recent falls but admits his balance is poor. He is using a cane. Breathing is about the same. Using oxygen at night and sounds like prn as well. No chest pain. No real  awareness of any AF. Not dizzy. He will note a sensation where his neck "feels strange" and his BP monitor shows that his BP will be lower - but this is transient and not sustained.   Past Medical History:  Diagnosis Date  . BPH (benign prostatic hyperplasia)   . Coronary artery ectasia    Mild CAD with normal systolic function per cath in August of 2011  . Diaphragmatic paralysis    felt to be partially responsible for dyspnea  . Dyspnea    due to paralyzed diaphragm and pulmonary issues  . Gout   . HTN (hypertension)   . Hypercholesteremia   . Microhematuria   . Obesity   . OSA (obstructive sleep apnea)   . Paroxysmal atrial fibrillation (HCC)    occured in the setting of acute E Coli sepsis and ileius 7/12  . PVC (premature ventricular contraction)    s/p PVC ablation 05/29/2010    Past Surgical History:  Procedure Laterality Date  . APPENDECTOMY  1978  . CARDIAC CATHETERIZATION  04-30-2010   Left main coronary artery is normal.   . CARDIOVASCULAR STRESS TEST  03-19-2010   0%  . CHOLECYSTECTOMY  1990  . poly removed from nose as a child    . PVC ablation  05/29/2010  . US ECHOCARDIOGRAPHY  03-09-2010   EF 55-60%     Medications: Current Meds  Medication  Sig  . allopurinol (ZYLOPRIM) 300 MG tablet Take 300 mg by mouth daily.    Marland Kitchen amLODipine (NORVASC) 5 MG tablet Take 5 mg by mouth daily.  Marland Kitchen aspirin 81 MG tablet Take 81 mg by mouth daily.    Marland Kitchen buPROPion (WELLBUTRIN SR) 100 MG 12 hr tablet Take 100 mg by mouth 2 (two) times daily.  Marland Kitchen dicyclomine (BENTYL) 20 MG tablet Take 20 mg by mouth 2 (two) times daily.   Marland Kitchen doxazosin (CARDURA) 4 MG tablet Take 1 tablet (4 mg total) by mouth daily.  . hydrALAZINE (APRESOLINE) 25 MG tablet Take 1 tablet (25 mg total) by mouth 2 (two) times daily.  Marland Kitchen ibuprofen (ADVIL,MOTRIN) 200 MG tablet Take 200-400 mg by mouth every 6 (six) hours as needed (body aches). For pain  . lisinopril (PRINIVIL,ZESTRIL) 40 MG tablet Take 40 mg by mouth daily.     Marland Kitchen lovastatin (MEVACOR) 40 MG tablet Take 40 mg by mouth at bedtime.    . metoprolol succinate (TOPROL-XL) 25 MG 24 hr tablet Take 25 mg by mouth 2 (two) times daily.   . OXYGEN Inhale 2 L into the lungs at bedtime.  . sertraline (ZOLOFT) 100 MG tablet Take 100 mg by mouth daily.  . traZODone (DESYREL) 100 MG tablet Take 100 mg by mouth at bedtime.    . [DISCONTINUED] Fluticasone-Umeclidin-Vilant (TRELEGY ELLIPTA) 100-62.5-25 MCG/INH AEPB Inhale 1 puff into the lungs daily.     Allergies: No Known Allergies  Social History: The patient  reports that he quit smoking about 33 years ago. His smoking use included cigarettes. He has a 60.00 pack-year smoking history. He has never used smokeless tobacco. He reports that he drinks alcohol. He reports that he does not use drugs.   Family History: The patient's family history includes Aneurysm (age of onset: 6) in his father; Breast cancer in his daughter and mother.   Review of Systems: Please see the history of present illness.   Otherwise, the review of systems is positive for none.   All other systems are reviewed and negative.   Physical Exam: VS:  BP 126/70 (BP Location: Left Arm, Patient Position: Sitting, Cuff Size: Normal)   Pulse (!) 58   Ht 5\' 10"  (1.778 m)   Wt 222 lb 6.4 oz (100.9 kg)   SpO2 93% Comment: at rest  BMI 31.91 kg/m  .  BMI Body mass index is 31.91 kg/m.  Wt Readings from Last 3 Encounters:  03/11/18 222 lb 6.4 oz (100.9 kg)  03/10/18 224 lb 3.3 oz (101.7 kg)  02/26/18 223 lb 5.2 oz (101.3 kg)    General: Pleasant. Elderly. Alert and in no acute distress.   HEENT: Normal.  Neck: Supple, no JVD, carotid bruits, or masses noted.  Cardiac: Regular rate and rhythm. No murmurs, rubs, or gallops. No edema.  Respiratory:  Lungs with decreased breath sounds.  GI: Soft and nontender.  MS: No deformity or atrophy. Gait and ROM intact.  Skin: Warm and dry. Color is normal.  Neuro:  Strength and sensation are intact  and no gross focal deficits noted.  Psych: Alert, appropriate and with normal affect.   LABORATORY DATA:  EKG:  EKG is ordered today. This demonstrates sinus 1st degree AV block with PVCs.  Lab Results  Component Value Date   WBC 5.6 10/14/2011   HGB 14.6 10/14/2011   HCT 42.8 10/14/2011   PLT 142.0 (L) 10/14/2011   GLUCOSE 96 12/17/2017   ALT 28 04/05/2011   AST  32 04/05/2011   NA 141 12/17/2017   K 5.3 (H) 12/17/2017   CL 106 12/17/2017   CREATININE 1.21 12/17/2017   BUN 35 (H) 12/17/2017   CO2 22 12/17/2017   TSH 1.66 10/14/2011   INR 1.26 04/07/2011     BNP (last 3 results) No results for input(s): BNP in the last 8760 hours.  ProBNP (last 3 results) No results for input(s): PROBNP in the last 8760 hours.   Other Studies Reviewed Today:  Holter 02/2018 Narrative & Impression    Sinus rhythm with first degree AV block, Right bundle branch block Average heart rate is 62 bpm Occasional premature ventricular contractions Frequent premature atrial contractions with nonsustained atrial tachycardia Nonsustained atrial fibrillation also observed Nocturnal bradycardia with mobitz I second degree AV block is noted transiently No sustained arrhythmias No prolonged pauses      Myoview Impression from March 2016 Exercise Capacity: Lexiscan with no exercise. BP Response: Normal blood pressure response. Clinical Symptoms: Nausea. ECG Impression: Atrial fibrillation, no changes from baseline.  Comparison with Prior Nuclear Study: No images to compare  Overall Impression: Low risk stress nuclear study a medium-sized basal inferior and basal to mid inferolateral perfusion defect that is actually worse at rest than with stress. No ischemia. Given normal wall motion, this may represent diaphragmatic attenuation. .  LV Ejection Fraction: 63%. LV Wall Motion: NL LV Function; NL Wall Motion   Loralie Champagne 11/07/2014   Echo Study Conclusions from February  2016  - Left ventricle: The cavity size was normal. Wall thickness was increased in a pattern of mild LVH. Systolic function was normal. The estimated ejection fraction was in the range of 60% to 65%. Wall motion was normal; there were no regional wall motion abnormalities. - Aortic valve: There was trivial regurgitation. - Left atrium: The atrium was mildly dilated. - Right atrium: The atrium was mildly dilated.  Assessment/Plan:  1.PAF noted on recent Holter - his CHADSVASC is at least 49 (age, HTN, CAD) - he is agreeable to starting Eliquis 5 mg BID. First dose tonight. Stop aspirin after tomorrow. Stop NSAID use. Lab today. He has had no recent falls but is at increased risk due to his issues with his balance. He is not interested in any AAD therapy to prevent his AF. I have otherwise left him on his current regimen.   2.  HTN - BP is fine - no changes made today.   3. ChronicDyspnea - He had cardiac studies back in 2016 which were stable. He is doing pulmonary rehab. His breathing seems stable.   4. PVCs - past ablation - remains on low dose Toprol.  5. Obesity -always a struggle. Not discussed today.   6. Depression - long standing issue- not discussed todaybut his demeanor seemed better to me today.  7. HLD - on statin - most recent lipids from PCP noted.   8. CAD - managed medically - no active symptoms.Would favor continued medical management.   Current medicines are reviewed with the patient today.  The patient does not have concerns regarding medicines other than what has been noted above.  The following changes have been made:  See above.  Labs/ tests ordered today include:    Orders Placed This Encounter  Procedures  . EKG 12-Lead     Disposition:   FU with me in about 4 weeks with repeat labs.    Patient is agreeable to this plan and will call if any problems develop in the interim.  SignedTruitt Merle, NP  03/11/2018 9:03  AM  [facility] [address] [place], [place]  [address] Phone: [phone] Fax: [phone]

## 2018-03-10 NOTE — Progress Notes (Signed)
Daily Session Note  Patient Details  Name: [name] MRN: [id number] Date of Birth: May 06, 1934 Referring Provider:     PULMONARY REHAB OTHER RESPIRATORY from 02/12/2018 in [place]  Referring Provider  Dr. [name]      Encounter Date: 03/10/2018  Check In: Session Check In - 03/10/18 1030      Check-In   Location  [facility]    Staff Present  Rosebud Poles, RN, BSN;Carlette Carlton, RN, [facility], MS, ACSM RCEP, Exercise Physiologist;Lisa Ysidro Evert, Felipe Drone, RN, [facility]    Supervising physician immediately available to respond to emergencies  [facility] immediately available    Physician(s)  Dr. Bonner Puna    Medication changes reported      No    Fall or balance concerns reported     No    Tobacco Cessation  No Change    Warm-up and Cool-down  Performed as group-led instruction    Resistance Training Performed  Yes    VAD Patient?  No    PAD/SET Patient?  No      Pain Assessment   Currently in Pain?  No/denies    Multiple Pain Sites  No       Capillary Blood Glucose: No results found for this or any previous visit (from the past 24 hour(s)).  Exercise Prescription Changes - 03/10/18 1200      Home Exercise Plan   Plans to continue exercise at  Home (comment)    Frequency  Add 2 additional days to program exercise sessions.       Social History   Tobacco Use  Smoking Status Former Smoker  . Packs/day: 3.00  . Years: 20.00  . Pack years: 60.00  . Types: Cigarettes  . Last attempt to quit: 09/09/1984  . Years since quitting: 33.5  Smokeless Tobacco Never Used    Goals Met:  Exercise tolerated well Strength training completed today  Goals Unmet:  Not Applicable  Comments: Service time is from 1030 to 1215    Dr. Rush Farmer is Medical Director for Pulmonary Rehab at [facility].

## 2018-03-11 ENCOUNTER — Ambulatory Visit: Payer: Medicare Other | Admitting: Nurse Practitioner

## 2018-03-11 ENCOUNTER — Encounter: Payer: Self-pay | Admitting: Nurse Practitioner

## 2018-03-11 VITALS — BP 126/70 | HR 58 | Ht 70.0 in | Wt 222.4 lb

## 2018-03-11 DIAGNOSIS — I480 Paroxysmal atrial fibrillation: Secondary | ICD-10-CM | POA: Diagnosis not present

## 2018-03-11 DIAGNOSIS — I259 Chronic ischemic heart disease, unspecified: Secondary | ICD-10-CM

## 2018-03-11 DIAGNOSIS — I493 Ventricular premature depolarization: Secondary | ICD-10-CM | POA: Diagnosis not present

## 2018-03-11 LAB — CBC
Hematocrit: 38.1 % (ref 37.5–51.0)
Hemoglobin: 12.5 g/dL — ABNORMAL LOW (ref 13.0–17.7)
MCH: 32 pg (ref 26.6–33.0)
MCHC: 32.8 g/dL (ref 31.5–35.7)
MCV: 97 fL (ref 79–97)
Platelets: 180 10*3/uL (ref 150–450)
RBC: 3.91 x10E6/uL — ABNORMAL LOW (ref 4.14–5.80)
RDW: 14.4 % (ref 12.3–15.4)
WBC: 5.5 10*3/uL (ref 3.4–10.8)

## 2018-03-11 LAB — BASIC METABOLIC PANEL
BUN/Creatinine Ratio: 27 — ABNORMAL HIGH (ref 10–24)
BUN: 31 mg/dL — ABNORMAL HIGH (ref 8–27)
CO2: 22 mmol/L (ref 20–29)
Calcium: 9 mg/dL (ref 8.6–10.2)
Chloride: 104 mmol/L (ref 96–106)
Creatinine, Ser: 1.14 mg/dL (ref 0.76–1.27)
GFR calc Af Amer: 69 mL/min/{1.73_m2} (ref >59)
GFR calc non Af Amer: 60 mL/min/{1.73_m2} (ref >59)
Glucose: 88 mg/dL (ref 65–99)
Potassium: 4.1 mmol/L (ref 3.5–5.2)
Sodium: 141 mmol/L (ref 134–144)

## 2018-03-11 MED ORDER — APIXABAN 5 MG PO TABS: 5.0000 mg | ORAL_TABLET | Freq: Two times a day (BID) | ORAL | 6 refills | Status: DC

## 2018-03-11 NOTE — Patient Instructions (Addendum)
We will be checking the following labs today - BMET and CBC   Medication Instructions:    Continue with your current medicines. BUT  STOP aspirin after tomorrow's dose  STOP Advil, Aleve, Ibuprofen - use Tylenol instead  I am starting Eliquis 5 mg to take twice a day - first dose tonight    Testing/Procedures To Be Arranged:  N/A  Follow-Up:   See me in about 4 weeks - will repeat lab at that time.     Other Special Instructions:   Apixaban (Eliquis) oral tablets What is this medicine? APIXABAN (a PIX a ban) is an anticoagulant (blood thinner). It is used to lower the chance of stroke in people with a medical condition called atrial fibrillation. It is also used to treat or prevent blood clots in the lungs or in the veins. This medicine may be used for other purposes; ask your health care provider or pharmacist if you have questions. COMMON BRAND NAME(S): Eliquis What should I tell my health care provider before I take this medicine? They need to know if you have any of these conditions: -bleeding disorders -bleeding in the brain -blood in your stools (black or tarry stools) or if you have blood in your vomit -history of stomach bleeding -kidney disease -liver disease -mechanical heart valve -an unusual or allergic reaction to apixaban, other medicines, foods, dyes, or preservatives -pregnant or trying to get pregnant -breast-feeding How should I use this medicine? Take this medicine by mouth with a glass of water. Follow the directions on the prescription label. You can take it with or without food. If it upsets your stomach, take it with food. Take your medicine at regular intervals. Do not take it more often than directed. Do not stop taking except on your doctor's advice. Stopping this medicine may increase your risk of a blot clot. Be sure to refill your prescription before you run out of medicine. Talk to your pediatrician regarding the use of this medicine in  children. Special care may be needed. Overdosage: If you think you have taken too much of this medicine contact a poison control center or emergency room at once. NOTE: This medicine is only for you. Do not share this medicine with others. What if I miss a dose? If you miss a dose, take it as soon as you can. If it is almost time for your next dose, take only that dose. Do not take double or extra doses. What may interact with this medicine? This medicine may interact with the following: -aspirin and aspirin-like medicines -certain medicines for fungal infections like ketoconazole and itraconazole -certain medicines for seizures like carbamazepine and phenytoin -certain medicines that treat or prevent blood clots like warfarin, enoxaparin, and dalteparin -clarithromycin -NSAIDs, medicines for pain and inflammation, like ibuprofen or naproxen -rifampin -ritonavir -St. John's wort This list may not describe all possible interactions. Give your health care provider a list of all the medicines, herbs, non-prescription drugs, or dietary supplements you use. Also tell them if you smoke, drink alcohol, or use illegal drugs. Some items may interact with your medicine. What should I watch for while using this medicine? Visit your doctor or health care professional for regular checks on your progress. Notify your doctor or health care professional and seek emergency treatment if you develop breathing problems; changes in vision; chest pain; severe, sudden headache; pain, swelling, warmth in the leg; trouble speaking; sudden numbness or weakness of the face, arm or leg. These can be signs that  your condition has gotten worse. If you are going to have surgery or other procedure, tell your doctor that you are taking this medicine. What side effects may I notice from receiving this medicine? Side effects that you should report to your doctor or health care professional as soon as possible: -allergic  reactions like skin rash, itching or hives, swelling of the face, lips, or tongue -signs and symptoms of bleeding such as bloody or black, tarry stools; red or dark-brown urine; spitting up blood or brown material that looks like coffee grounds; red spots on the skin; unusual bruising or bleeding from the eye, gums, or nose This list may not describe all possible side effects. Call your doctor for medical advice about side effects. You may report side effects to FDA at 1-800-FDA-1088. Where should I keep my medicine? Keep out of the reach of children. Store at room temperature between 20 and 25 degrees C (68 and 77 degrees F). Throw away any unused medicine after the expiration date. NOTE: This sheet is a summary. It may not cover all possible information. If you have questions about this medicine, talk to your doctor, pharmacist, or health care provider.  2018 Elsevier/Gold Standard (2016-03-18 11:54:23)      If you need a refill on your cardiac medications before your next appointment, please call your pharmacy.   Call the [place] office at [phone] if you have any questions, problems or concerns.

## 2018-03-17 ENCOUNTER — Encounter (HOSPITAL_COMMUNITY)
Admission: RE | Admit: 2018-03-17 | Discharge: 2018-03-17 | Disposition: A | Discharge status: Home / Self Care | Payer: Medicare Other | Source: Ambulatory Visit | Attending: Pulmonary Disease | Admitting: Pulmonary Disease

## 2018-03-17 VITALS — Wt 219.1 lb

## 2018-03-17 DIAGNOSIS — J432 Centrilobular emphysema: Secondary | ICD-10-CM | POA: Diagnosis not present

## 2018-03-17 NOTE — Progress Notes (Signed)
Daily Session Note  Patient Details  Name: [name] MRN: [id number] Date of Birth: 18-Sep-1933 Referring Provider:     PULMONARY REHAB OTHER RESPIRATORY from 02/12/2018 in [place]  Referring Provider  Dr. [name]      Encounter Date: 03/17/2018  Check In: Session Check In - 03/17/18 1030      Check-In   Location  [facility]    Staff Present  Rosebud Poles, RN, BSN;Carlette Wilber Oliphant, RN, BSN;Lisa Ysidro Evert, Felipe Drone, RN, [facility]    Supervising physician immediately available to respond to emergencies  [facility] immediately available    Physician(s)  Dr. Verlon Au    Medication changes reported      No    Fall or balance concerns reported     No    Tobacco Cessation  No Change    Warm-up and Cool-down  Performed as group-led instruction    Resistance Training Performed  Yes    VAD Patient?  No    PAD/SET Patient?  No      Pain Assessment   Currently in Pain?  No/denies    Multiple Pain Sites  No       Capillary Blood Glucose: No results found for this or any previous visit (from the past 24 hour(s)).  Exercise Prescription Changes - 03/17/18 1300      Response to Exercise   Blood Pressure (Admit)  102/60    Blood Pressure (Exercise)  130/70    Blood Pressure (Exit)  126/58    Heart Rate (Admit)  60 bpm    Heart Rate (Exercise)  78 bpm    Heart Rate (Exit)  51 bpm    Oxygen Saturation (Admit)  96 %    Oxygen Saturation (Exercise)  94 %    Oxygen Saturation (Exit)  98 %    Rating of Perceived Exertion (Exercise)  11    Perceived Dyspnea (Exercise)  2    Duration  Progress to 45 minutes of aerobic exercise without signs/symptoms of physical distress    Intensity  THRR unchanged      Progression   Progression  Continue to progress workloads to maintain intensity without signs/symptoms of physical distress.      Resistance Training   Training Prescription  Yes    Weight  blue bands    Reps  10-15     Time  10 Minutes      Interval Training   Interval Training  No      Oxygen   Oxygen  Continuous    Liters  2      NuStep   Level  4    SPM  80    Minutes  17    METs  1.8      Track   Laps  15    Minutes  17       Social History   Tobacco Use  Smoking Status Former Smoker  . Packs/day: 3.00  . Years: 20.00  . Pack years: 60.00  . Types: Cigarettes  . Last attempt to quit: 09/09/1984  . Years since quitting: 33.5  Smokeless Tobacco Never Used    Goals Met:  Exercise tolerated well Strength training completed today  Goals Unmet:  Not Applicable  Comments: Service time is from 1030 to 1210    Dr. Rush Farmer is Medical Director for Pulmonary Rehab at [facility].

## 2018-03-19 ENCOUNTER — Encounter (HOSPITAL_COMMUNITY)
Admission: RE | Admit: 2018-03-19 | Discharge: 2018-03-19 | Disposition: A | Discharge status: Home / Self Care | Payer: Medicare Other | Source: Ambulatory Visit | Attending: Pulmonary Disease | Admitting: Pulmonary Disease

## 2018-03-19 VITALS — Wt 218.0 lb

## 2018-03-19 DIAGNOSIS — J432 Centrilobular emphysema: Secondary | ICD-10-CM

## 2018-03-19 NOTE — Addendum Note (Signed)
Encounter addended by: Lance Morin, RN on: 03/19/2018 1:25 PM  Actions taken: Sign clinical note

## 2018-03-19 NOTE — Progress Notes (Signed)
Daily Session Note  Patient Details  Name: [name] MRN: [id number] Date of Birth: Dec 26, 1933 Referring Provider:     PULMONARY REHAB OTHER RESPIRATORY from 02/12/2018 in [place]  Referring Provider  Dr. [name]      Encounter Date: 03/19/2018  Check In: Session Check In - 03/19/18 1030      Check-In   Location  [facility]    Staff Present  Rosebud Poles, RN, BSN;Carlette Wilber Oliphant, RN, BSN;Lisa Ysidro Evert, Felipe Drone, RN, [facility]    Supervising physician immediately available to respond to emergencies  [facility] immediately available    Physician(s)  Dr. Broadus John    Medication changes reported      No    Fall or balance concerns reported     No    Tobacco Cessation  No Change    Warm-up and Cool-down  Performed as group-led instruction    Resistance Training Performed  Yes    VAD Patient?  No    PAD/SET Patient?  No      Pain Assessment   Currently in Pain?  No/denies    Multiple Pain Sites  No       Capillary Blood Glucose: No results found for this or any previous visit (from the past 24 hour(s)).    Social History   Tobacco Use  Smoking Status Former Smoker  . Packs/day: 3.00  . Years: 20.00  . Pack years: 60.00  . Types: Cigarettes  . Last attempt to quit: 09/09/1984  . Years since quitting: 33.5  Smokeless Tobacco Never Used    Goals Met:  Exercise tolerated well Strength training completed today  Goals Unmet:  Not Applicable  Comments: Service time is from 1030 to 1225    Dr. Rush Farmer is Medical Director for Pulmonary Rehab at [facility].

## 2018-03-24 ENCOUNTER — Encounter (HOSPITAL_COMMUNITY)
Admission: RE | Admit: 2018-03-24 | Discharge: 2018-03-24 | Disposition: A | Discharge status: Home / Self Care | Payer: Medicare Other | Source: Ambulatory Visit | Attending: Pulmonary Disease | Admitting: Pulmonary Disease

## 2018-03-24 DIAGNOSIS — J432 Centrilobular emphysema: Secondary | ICD-10-CM | POA: Diagnosis not present

## 2018-03-26 ENCOUNTER — Encounter (HOSPITAL_COMMUNITY)
Admission: RE | Admit: 2018-03-26 | Discharge: 2018-03-26 | Disposition: A | Discharge status: Home / Self Care | Payer: Medicare Other | Source: Ambulatory Visit | Attending: Pulmonary Disease | Admitting: Pulmonary Disease

## 2018-03-26 ENCOUNTER — Telehealth: Payer: Self-pay | Admitting: Pulmonary Disease

## 2018-03-26 VITALS — Wt 218.5 lb

## 2018-03-26 DIAGNOSIS — J432 Centrilobular emphysema: Secondary | ICD-10-CM

## 2018-03-26 NOTE — Telephone Encounter (Signed)
Attempted to contact [name]. The pulmonary rehab department is closed for lunch. I have left a message for Cloyde Reams to return our call.

## 2018-03-26 NOTE — Progress Notes (Signed)
Daily Session Note  Patient Details  Name: [name] MRN: [id number] Date of Birth: 08/20/1934 Referring Provider:     PULMONARY REHAB OTHER RESPIRATORY from 02/12/2018 in [place]  Referring Provider  Dr. [name]      Encounter Date: 03/26/2018  Check In: Session Check In - 03/26/18 1030      Check-In   Location  [facility]    Staff Present  Rosebud Poles, RN, BSN;Carlette Carlton, RN, [facility], MS, ACSM RCEP, Exercise Physiologist;Lisa Ysidro Evert, Felipe Drone, RN, [facility]    Supervising physician immediately available to respond to emergencies  [facility] immediately available    Physician(s)  Dr. Denton Brick    Medication changes reported      No    Fall or balance concerns reported     No    Tobacco Cessation  No Change    Warm-up and Cool-down  Performed as group-led instruction    Resistance Training Performed  Yes    VAD Patient?  No    PAD/SET Patient?  No      Pain Assessment   Currently in Pain?  No/denies    Multiple Pain Sites  No       Capillary Blood Glucose: No results found for this or any previous visit (from the past 24 hour(s)).    Social History   Tobacco Use  Smoking Status Former Smoker  . Packs/day: 3.00  . Years: 20.00  . Pack years: 60.00  . Types: Cigarettes  . Last attempt to quit: 09/09/1984  . Years since quitting: 33.5  Smokeless Tobacco Never Used    Goals Met:  Exercise tolerated well Strength training completed today  Goals Unmet:  Not Applicable  Comments: Service time is from 1030 to 1210    Dr. Rush Farmer is Medical Director for Pulmonary Rehab at [facility].

## 2018-03-30 NOTE — Telephone Encounter (Signed)
OK by me 

## 2018-03-30 NOTE — Telephone Encounter (Signed)
Spoke with Cloyde Reams at Pulmonary Rehab. States patient is currently using 2l O2 cont flow at pulm rehab and needs order placed for POC so patient can use with exertion. Cloyde Reams will qualify patient again tomorrow as most recent walk is older than 30 days. Cloyde Reams will send over results tomorrow so we can place order.   [place], are you okay with this?

## 2018-03-30 NOTE — Telephone Encounter (Signed)
Checked BQ's cubby and up front, did not see a fax from [facility]. Will await fax so the order can be placed.

## 2018-03-30 NOTE — Progress Notes (Signed)
Pulmonary Individual Treatment Plan  Patient Details  Name: [name] MRN: [id number] Date of Birth: 02-05-1934 Referring Provider:     PULMONARY REHAB OTHER RESPIRATORY from 02/12/2018 in [place]  Referring Provider  Dr. [name]      Initial Encounter Date:    PULMONARY REHAB OTHER RESPIRATORY from 02/12/2018 in [place]  Date  02/05/18      Visit Diagnosis: Centrilobular emphysema ([place])  Patient's Home Medications on Admission:   Current Outpatient Medications:  .  allopurinol (ZYLOPRIM) 300 MG tablet, Take 300 mg by mouth daily.  , Disp: , Rfl:  .  amLODipine (NORVASC) 5 MG tablet, Take 5 mg by mouth daily., Disp: , Rfl: 6 .  apixaban (ELIQUIS) 5 MG TABS tablet, Take 1 tablet (5 mg total) by mouth 2 (two) times daily., Disp: 60 tablet, Rfl: 6 .  buPROPion (WELLBUTRIN SR) 100 MG 12 hr tablet, Take 100 mg by mouth 2 (two) times daily., Disp: , Rfl:  .  dicyclomine (BENTYL) 20 MG tablet, Take 20 mg by mouth 2 (two) times daily. , Disp: , Rfl:  .  doxazosin (CARDURA) 4 MG tablet, Take 1 tablet (4 mg total) by mouth daily., Disp: 30 tablet, Rfl: 3 .  hydrALAZINE (APRESOLINE) 25 MG tablet, Take 1 tablet (25 mg total) by mouth 2 (two) times daily., Disp: 60 tablet, Rfl: 6 .  lisinopril (PRINIVIL,ZESTRIL) 40 MG tablet, Take 40 mg by mouth daily. , Disp: , Rfl:  .  lovastatin (MEVACOR) 40 MG tablet, Take 40 mg by mouth at bedtime.  , Disp: , Rfl:  .  metoprolol succinate (TOPROL-XL) 25 MG 24 hr tablet, Take 25 mg by mouth 2 (two) times daily. , Disp: , Rfl:  .  OXYGEN, Inhale 2 L into the lungs at bedtime., Disp: , Rfl:  .  sertraline (ZOLOFT) 100 MG tablet, Take 100 mg by mouth daily., Disp: , Rfl: 5 .  traZODone (DESYREL) 100 MG tablet, Take 100 mg by mouth at bedtime.  , Disp: , Rfl:   Past Medical History: Past Medical History:  Diagnosis Date  . BPH (benign prostatic hyperplasia)   . Coronary artery ectasia     Mild CAD with normal systolic function per cath in August of 2011  . Diaphragmatic paralysis    felt to be partially responsible for dyspnea  . Dyspnea    due to paralyzed diaphragm and pulmonary issues  . Gout   . HTN (hypertension)   . Hypercholesteremia   . Microhematuria   . Obesity   . OSA (obstructive sleep apnea)   . Paroxysmal atrial fibrillation (HCC)    occured in the setting of acute E Coli sepsis and ileius 7/12  . PVC (premature ventricular contraction)    s/p PVC ablation 05/29/2010    Tobacco Use: Social History   Tobacco Use  Smoking Status Former Smoker  . Packs/day: 3.00  . Years: 20.00  . Pack years: 60.00  . Types: Cigarettes  . Last attempt to quit: 09/09/1984  . Years since quitting: 33.5  Smokeless Tobacco Never Used    Labs: Recent Chemical engineer    Labs for ITP Cardiac and Pulmonary Rehab Latest Ref Rng & Units 07/03/2010 04/02/2011   PHART 7.350 - 7.450 7.414 -   PCO2ART 35.0 - 45.0 mmHg 37.5 -   HCO3 20.0 - 24.0 mEq/L 23.5 -   TCO2 0 - 100 mmol/L 20.4 23   ACIDBASEDEF 0.0 -  2.0 mmol/L 0.2 -   O2SAT % 95.1 -      Capillary Blood Glucose: Lab Results  Component Value Date   GLUCAP 117 (H) 04/03/2011     Pulmonary Assessment Scores: Pulmonary Assessment Scores    Row Name 02/06/18 0859 02/17/18 0919       ADL UCSD   ADL Phase  Entry  Entry    SOB Score total  -  70      CAT Score   CAT Score  -  21      mMRC Score   mMRC Score  3  -       Pulmonary Function Assessment:   Exercise Target Goals:    Exercise Program Goal: Individual exercise prescription set using results from initial 6 min walk test and THRR while considering  patient's activity barriers and safety.    Exercise Prescription Goal: Initial exercise prescription builds to 30-45 minutes a day of aerobic activity, 2-3 days per week.  Home exercise guidelines will be given to patient during program as part of exercise prescription that the participant  will acknowledge.  Activity Barriers & Risk Stratification:   6 Minute Walk: 6 Minute Walk    Row Name 02/06/18 0855 02/06/18 0857       6 Minute Walk   Phase  Initial  (Pended)   Initial    Distance  1049 feet  (Pended)   1049 feet    Walk Time  6 minutes  (Pended)   6 minutes    # of Rest Breaks  0  (Pended)   0    MPH  1.98  (Pended)   1.98    METS  -  2.53    RPE  -  12    Perceived Dyspnea   -  1    Symptoms  -  Yes (comment)    Comments  -  used rollator-no other complaints-zoll showed trigeminy-cardiologist made aware-sent strips    Resting HR  -  75 bpm    Resting BP  -  126/64    Resting Oxygen Saturation   -  90 %    Exercise Oxygen Saturation  during 6 min walk  -  85 %    Max Ex. HR  -  106 bpm    Max Ex. BP  -  140/68      Interval HR   1 Minute HR  -  89    2 Minute HR  -  86    3 Minute HR  -  103    4 Minute HR  -  106    5 Minute HR  -  103    6 Minute HR  -  104    2 Minute Post HR  -  94    Interval Heart Rate?  -  Yes      Interval Oxygen   Interval Oxygen?  -  Yes    Baseline Oxygen Saturation %  -  90 %    1 Minute Oxygen Saturation %  -  89 %    1 Minute Liters of Oxygen  -  0 L    2 Minute Oxygen Saturation %  -  86 %    2 Minute Liters of Oxygen  -  0 L    3 Minute Oxygen Saturation %  -  85 %    3 Minute Liters of Oxygen  -  0 L    4  Minute Oxygen Saturation %  -  89 %    4 Minute Liters of Oxygen  -  2 L    5 Minute Oxygen Saturation %  -  89 %    5 Minute Liters of Oxygen  -  2 L    6 Minute Oxygen Saturation %  -  90 %    6 Minute Liters of Oxygen  -  2 L    2 Minute Post Oxygen Saturation %  -  93 %    2 Minute Post Liters of Oxygen  -  2 L       Oxygen Initial Assessment: Oxygen Initial Assessment - 02/06/18 0854      Initial 6 min Walk   Oxygen Used  Continuous Patient not originally on 02. Desaturated to 85% on room air during walk test. 2 liters used. Will cont. to observe patient during exercise for oxygen needs.     Liters per minute  2      Program Oxygen Prescription   Program Oxygen Prescription  Continuous;E-Tanks    Liters per minute  2       Oxygen Re-Evaluation: Oxygen Re-Evaluation    Row Name 02/11/18 1533 03/06/18 1040 03/30/18 0829         Program Oxygen Prescription   Program Oxygen Prescription  Continuous;E-Tanks  Continuous;E-Tanks  Continuous;E-Tanks     Liters per minute  -  2  2       Home Oxygen   Home Oxygen Device  Home Concentrator  Home Concentrator  Home Concentrator;E-Tanks Order just placed today for POC for exercise at home     Sleep Oxygen Prescription  CPAP  CPAP  CPAP     Home Exercise Oxygen Prescription  None  Continuous  Continuous     Liters per minute  -  2  2     Home at Rest Exercise Oxygen Prescription  None  None  None     Compliance with Home Oxygen Use  Yes  Yes  Yes       Goals/Expected Outcomes   Short Term Goals  To learn and demonstrate proper use of respiratory medications;To learn and demonstrate proper pursed lip breathing techniques or other breathing techniques.;To learn and understand importance of monitoring SPO2 with pulse oximeter and demonstrate accurate use of the pulse oximeter.;To learn and understand importance of maintaining oxygen saturations>88%;To learn and exhibit compliance with exercise, home and travel O2 prescription  To learn and demonstrate proper use of respiratory medications;To learn and demonstrate proper pursed lip breathing techniques or other breathing techniques.;To learn and understand importance of monitoring SPO2 with pulse oximeter and demonstrate accurate use of the pulse oximeter.;To learn and understand importance of maintaining oxygen saturations>88%;To learn and exhibit compliance with exercise, home and travel O2 prescription  To learn and demonstrate proper use of respiratory medications;To learn and demonstrate proper pursed lip breathing techniques or other breathing techniques.;To learn and understand  importance of monitoring SPO2 with pulse oximeter and demonstrate accurate use of the pulse oximeter.;To learn and understand importance of maintaining oxygen saturations>88%;To learn and exhibit compliance with exercise, home and travel O2 prescription     Long  Term Goals  Exhibits compliance with exercise, home and travel O2 prescription;Verbalizes importance of monitoring SPO2 with pulse oximeter and return demonstration;Maintenance of O2 saturations>88%;Exhibits proper breathing techniques, such as pursed lip breathing or other method taught during program session;Compliance with respiratory medication;Demonstrates proper use of MDI's  Exhibits compliance with exercise, home  and travel O2 prescription;Verbalizes importance of monitoring SPO2 with pulse oximeter and return demonstration;Maintenance of O2 saturations>88%;Exhibits proper breathing techniques, such as pursed lip breathing or other method taught during program session;Compliance with respiratory medication;Demonstrates proper use of MDI's  Exhibits compliance with exercise, home and travel O2 prescription;Verbalizes importance of monitoring SPO2 with pulse oximeter and return demonstration;Maintenance of O2 saturations>88%;Exhibits proper breathing techniques, such as pursed lip breathing or other method taught during program session;Compliance with respiratory medication;Demonstrates proper use of MDI's     Goals/Expected Outcomes  -  Compliance and understanding of new home oxygen systems  Compliance and understanding of new home oxygen systems        Oxygen Discharge (Final Oxygen Re-Evaluation): Oxygen Re-Evaluation - 03/30/18 0829      Program Oxygen Prescription   Program Oxygen Prescription  Continuous;E-Tanks    Liters per minute  2      Home Oxygen   Home Oxygen Device  Home Concentrator;E-Tanks Order just placed today for POC for exercise at home    Sleep Oxygen Prescription  CPAP    Home Exercise Oxygen Prescription   Continuous    Liters per minute  2    Home at Rest Exercise Oxygen Prescription  None    Compliance with Home Oxygen Use  Yes      Goals/Expected Outcomes   Short Term Goals  To learn and demonstrate proper use of respiratory medications;To learn and demonstrate proper pursed lip breathing techniques or other breathing techniques.;To learn and understand importance of monitoring SPO2 with pulse oximeter and demonstrate accurate use of the pulse oximeter.;To learn and understand importance of maintaining oxygen saturations>88%;To learn and exhibit compliance with exercise, home and travel O2 prescription    Long  Term Goals  Exhibits compliance with exercise, home and travel O2 prescription;Verbalizes importance of monitoring SPO2 with pulse oximeter and return demonstration;Maintenance of O2 saturations>88%;Exhibits proper breathing techniques, such as pursed lip breathing or other method taught during program session;Compliance with respiratory medication;Demonstrates proper use of MDI's    Goals/Expected Outcomes  Compliance and understanding of new home oxygen systems       Initial Exercise Prescription: Initial Exercise Prescription - 02/12/18 1000      Date of Initial Exercise RX and Referring Provider   Date  02/05/18    Referring Provider  Dr. [name]      Oxygen   Oxygen  Continuous    Liters  2      NuStep   Level  2    SPM  80    Minutes  17    METs  1.5      Track   Laps  5    Minutes  17      Prescription Details   Frequency (times per week)  2    Duration  Progress to 45 minutes of aerobic exercise without signs/symptoms of physical distress      Intensity   THRR 40-80% of Max Heartrate  55-110    Ratings of Perceived Exertion  11-13    Perceived Dyspnea  0-4      Progression   Progression  Continue progressive overload as per policy without signs/symptoms or physical distress.      Resistance Training   Training Prescription  Yes    Weight  blue bands     Reps  10-15       Perform Capillary Blood Glucose checks as needed.  Exercise Prescription Changes:  Exercise Prescription Changes    Row Name  02/17/18 1200 02/26/18 1121 03/10/18 1200 03/17/18 1300 03/31/18 1200     Response to Exercise   Blood Pressure (Admit)  120/72  130/80  -  102/60  140/60   Blood Pressure (Exercise)  128/79  120/70  -  130/70  -   Blood Pressure (Exit)  122/66  138/64  -  126/58  100/56   Heart Rate (Admit)  65 bpm  63 bpm  -  60 bpm  58 bpm   Heart Rate (Exercise)  74 bpm  84 bpm  -  78 bpm  94 bpm   Heart Rate (Exit)  59 bpm  62 bpm  -  51 bpm  65 bpm   Oxygen Saturation (Admit)  93 %  91 %  -  96 %  95 %   Oxygen Saturation (Exercise)  92 %  92 %  -  94 %  93 %   Oxygen Saturation (Exit)  95 %  93 %  -  98 %  97 %   Rating of Perceived Exertion (Exercise)  13  11  -  11  12   Perceived Dyspnea (Exercise)  3  2  -  2  2   Duration  Progress to 45 minutes of aerobic exercise without signs/symptoms of physical distress  Progress to 45 minutes of aerobic exercise without signs/symptoms of physical distress  -  Progress to 45 minutes of aerobic exercise without signs/symptoms of physical distress  Progress to 45 minutes of aerobic exercise without signs/symptoms of physical distress   Intensity  Other (comment) 40-80% HRR  THRR unchanged  -  THRR unchanged  THRR unchanged     Progression   Progression  Continue to progress workloads to maintain intensity without signs/symptoms of physical distress.  Continue to progress workloads to maintain intensity without signs/symptoms of physical distress.  -  Continue to progress workloads to maintain intensity without signs/symptoms of physical distress.  Continue to progress workloads to maintain intensity without signs/symptoms of physical distress.     Resistance Training   Training Prescription  Yes  Yes  -  Yes  Yes   Weight  - blue bands  blue bands  -  blue bands  blue bands   Reps  10-15  10-15  -  10-15   10-15   Time  10 Minutes  10 Minutes  -  10 Minutes  10 Minutes     Interval Training   Interval Training  No  No  -  No  No     Oxygen   Oxygen  -  -  -  Continuous  Continuous   Liters  2  2  -  2  2     NuStep   Level  3  3  -  4  5   SPM  80  80  -  80  80   Minutes  34  17  -  17  34   METs  1.7  2.2  -  1.8  2     Track   Laps  9  11  -  15  -   Minutes  17  17  -  17  -     Home Exercise Plan   Plans to continue exercise at  -  -  Home (comment)  -  -   Frequency  -  -  Add 2 additional days to program exercise sessions.  -  -  Exercise Comments:  Exercise Comments    Row Name 03/10/18 1245           Exercise Comments  Home exercise changes          Exercise Goals and Review:  Exercise Goals    Row Name 01/28/18 1113             Exercise Goals   Increase Physical Activity  Yes       Intervention  Provide advice, education, support and counseling about physical activity/exercise needs.;Develop an individualized exercise prescription for aerobic and resistive training based on initial evaluation findings, risk stratification, comorbidities and participant's personal goals.       Expected Outcomes  Short Term: Attend rehab on a regular basis to increase amount of physical activity.;Long Term: Add in home exercise to make exercise part of routine and to increase amount of physical activity.;Long Term: Exercising regularly at least 3-5 days a week.       Increase Strength and Stamina  Yes       Intervention  Provide advice, education, support and counseling about physical activity/exercise needs.;Develop an individualized exercise prescription for aerobic and resistive training based on initial evaluation findings, risk stratification, comorbidities and participant's personal goals.       Expected Outcomes  Short Term: Increase workloads from initial exercise prescription for resistance, speed, and METs.;Short Term: Perform resistance training exercises  routinely during rehab and add in resistance training at home;Long Term: Improve cardiorespiratory fitness, muscular endurance and strength as measured by increased METs and functional capacity (6MWT)       Able to understand and use rate of perceived exertion (RPE) scale  Yes       Intervention  Provide education and explanation on how to use RPE scale       Expected Outcomes  Long Term:  Able to use RPE to guide intensity level when exercising independently;Short Term: Able to use RPE daily in rehab to express subjective intensity level       Able to understand and use Dyspnea scale  Yes       Intervention  Provide education and explanation on how to use Dyspnea scale       Expected Outcomes  Short Term: Able to use Dyspnea scale daily in rehab to express subjective sense of shortness of breath during exertion;Long Term: Able to use Dyspnea scale to guide intensity level when exercising independently       Knowledge and understanding of Target Heart Rate Range (THRR)  Yes       Intervention  Provide education and explanation of THRR including how the numbers were predicted and where they are located for reference       Expected Outcomes  Short Term: Able to state/look up THRR;Long Term: Able to use THRR to govern intensity when exercising independently;Short Term: Able to use daily as guideline for intensity in rehab       Understanding of Exercise Prescription  Yes       Intervention  Provide education, explanation, and written materials on patient's individual exercise prescription       Expected Outcomes  Short Term: Able to explain program exercise prescription;Long Term: Able to explain home exercise prescription to exercise independently          Exercise Goals Re-Evaluation : Exercise Goals Re-Evaluation    Row Name 03/06/18 1041 03/30/18 0830 03/30/18 0854         Exercise Goal Re-Evaluation   Exercise Goals Review  Increase  Physical Activity;Able to understand and use rate of  perceived exertion (RPE) scale;Knowledge and understanding of Target Heart Rate Range (THRR);Understanding of Exercise Prescription;Increase Strength and Stamina;Able to understand and use Dyspnea scale;Able to check pulse independently  Increase Physical Activity;Able to understand and use rate of perceived exertion (RPE) scale;Knowledge and understanding of Target Heart Rate Range (THRR);Understanding of Exercise Prescription;Increase Strength and Stamina;Able to understand and use Dyspnea scale;Able to check pulse independently  (Pended)   Increase Physical Activity;Able to understand and use rate of perceived exertion (RPE) scale;Knowledge and understanding of Target Heart Rate Range (THRR);Understanding of Exercise Prescription;Increase Strength and Stamina;Able to understand and use Dyspnea scale;Able to check pulse independently     Comments  Patient has attended 5 rehab sessions. Progress will be slow and probably not steady. Will cont. to monitor and progress as able. Has been out for 1 week on vacation.  -  Patient is progressing slowly in program. This is due to barriers of deconditoning and shortness of breath. Order placed for patient to receive POC. Is able to walk 12 laps (200 ft each) in 15 minutes. MET level (1.9) places him at a low category. Will cont. to progress and motivate as possible.     Expected Outcomes  Through exercise at rehab and at home, the patient will decrease shortness of breath with daily activities and feel confident in carrying out an exercise regime at home.   Through exercise at rehab and at home, the patient will decrease shortness of breath with daily activities and feel confident in carrying out an exercise regime at home.   (Pended)   Through exercise at rehab and at home, the patient will decrease shortness of breath with daily activities and feel confident in carrying out an exercise regime at home.         Discharge Exercise Prescription (Final Exercise  Prescription Changes): Exercise Prescription Changes - 03/31/18 1200      Response to Exercise   Blood Pressure (Admit)  140/60    Blood Pressure (Exit)  100/56    Heart Rate (Admit)  58 bpm    Heart Rate (Exercise)  94 bpm    Heart Rate (Exit)  65 bpm    Oxygen Saturation (Admit)  95 %    Oxygen Saturation (Exercise)  93 %    Oxygen Saturation (Exit)  97 %    Rating of Perceived Exertion (Exercise)  12    Perceived Dyspnea (Exercise)  2    Duration  Progress to 45 minutes of aerobic exercise without signs/symptoms of physical distress    Intensity  THRR unchanged      Progression   Progression  Continue to progress workloads to maintain intensity without signs/symptoms of physical distress.      Resistance Training   Training Prescription  Yes    Weight  blue bands    Reps  10-15    Time  10 Minutes      Interval Training   Interval Training  No      Oxygen   Oxygen  Continuous    Liters  2      NuStep   Level  5    SPM  80    Minutes  34    METs  2       Nutrition:  Target Goals: Understanding of nutrition guidelines, daily intake of sodium <1549m, cholesterol <2081m calories 30% from fat and 7% or less from saturated fats, daily to have 5 or more servings  of fruits and vegetables.  Biometrics:    Nutrition Therapy Plan and Nutrition Goals: Nutrition Therapy & Goals - 03/04/18 1705      Nutrition Therapy   Diet  Heart Healthy      Personal Nutrition Goals   Nutrition Goal  Pt to identify and limit food sources of sodium.      Intervention Plan   Intervention  Prescribe, educate and counsel regarding individualized specific dietary modifications aiming towards targeted core components such as weight, hypertension, lipid management, diabetes, heart failure and other comorbidities.    Expected Outcomes  Short Term Goal: Understand basic principles of dietary content, such as calories, fat, sodium, cholesterol and nutrients.;Long Term Goal: Adherence to  prescribed nutrition plan.       Nutrition Assessments: Nutrition Assessments - 02/05/18 1400      Rate Your Plate Scores   Pre Score  53       Nutrition Goals Re-Evaluation:   Nutrition Goals Discharge (Final Nutrition Goals Re-Evaluation):   Psychosocial: Target Goals: Acknowledge presence or absence of significant depression and/or stress, maximize coping skills, provide positive support system. Participant is able to verbalize types and ability to use techniques and skills needed for reducing stress and depression.  Initial Review & Psychosocial Screening: Initial Psych Review & Screening - 01/28/18 1026      Initial Review   Current issues with  Current Depression;History of Depression;Current Anxiety/Panic;Current Psychotropic Meds;Current Stress Concerns;Current Sleep Concerns    Source of Stress Concerns  Chronic Illness;Unable to participate in former interests or hobbies;Unable to perform yard/household activities    Comments  fearful of falling      [place]?  Yes      Barriers   Psychosocial barriers to participate in program  The patient should benefit from training in stress management and relaxation.      Screening Interventions   Interventions  Encouraged to exercise    Expected Outcomes  Short Term goal: Identification and review with participant of any Quality of Life or Depression concerns found by scoring the questionnaire.;Long Term Goal: Stressors or current issues are controlled or eliminated.       Quality of Life Scores:  Scores of 19 and below usually indicate a poorer quality of life in these areas.  A difference of  2-3 points is a clinically meaningful difference.  A difference of 2-3 points in the total score of the Quality of Life Index has been associated with significant improvement in overall quality of life, self-image, physical symptoms, and general health in studies assessing change in quality of  life.   PHQ-9: Recent Review Flowsheet Data    Depression screen [facility] 2/9 01/28/2018 05/31/2015   Decreased Interest 0 0   Down, Depressed, Hopeless 1 0   PHQ - 2 Score 1 0   Altered sleeping 2 -   Tired, decreased energy 1 -   Feeling bad or failure about yourself  1 -   Trouble concentrating 1 -   Moving slowly or fidgety/restless 0 -   Suicidal thoughts 0 -   PHQ-9 Score 6 -   Difficult doing work/chores Somewhat difficult -     Interpretation of Total Score  Total Score Depression Severity:  1-4 = Minimal depression, 5-9 = Mild depression, 10-14 = Moderate depression, 15-19 = Moderately severe depression, 20-27 = Severe depression   Psychosocial Evaluation and Intervention: Psychosocial Evaluation - 03/30/18 1436      Psychosocial Evaluation & Interventions  Interventions  Stress management education;Relaxation education;Encouraged to exercise with the program and follow exercise prescription    Comments  Pt with anxiety regarding falling.  Pt is doing well with the addition of oxygen therapy on exertion.  Pt reports no falls in the last 30 days.    Expected Outcomes  Pt will be free of any psychosocial needs    Continue Psychosocial Services   Follow up required by staff       Psychosocial Re-Evaluation: Psychosocial Re-Evaluation    [place] Name 03/09/18 1415 03/30/18 1516           Psychosocial Re-Evaluation   Current issues with  Current Stress Concerns  -      Comments  Going to see Truitt Merle July 3 to get results of holter monitor, this has been a stressor, when out of town last week and was told by MD it was ok to go  Pt met with Truitt Merle and received a good report.  Pt placed on Eliquis for his burst of afib.  Pt is grateful to the rehab staff for sending the monitor strips which then warranted holter montioring.      Expected Outcomes  Continue to support re: fear of falling  Continue to support re: fear of falling      Continue Psychosocial Services   No  Follow up required  No Follow up required      Comments  fearful of falling  fearful of falling        Initial Review   Source of Stress Concerns  Chronic Illness;Unable to participate in former interests or hobbies;Unable to perform yard/household activities  Chronic Illness;Unable to participate in former interests or hobbies;Unable to perform yard/household activities         Psychosocial Discharge (Final Psychosocial Re-Evaluation): Psychosocial Re-Evaluation - 03/30/18 1516      Psychosocial Re-Evaluation   Comments  Pt met with Truitt Merle and received a good report.  Pt placed on Eliquis for his burst of afib.  Pt is grateful to the rehab staff for sending the monitor strips which then warranted holter montioring.    Expected Outcomes  Continue to support re: fear of falling    Continue Psychosocial Services   No Follow up required    Comments  fearful of falling      Initial Review   Source of Stress Concerns  Chronic Illness;Unable to participate in former interests or hobbies;Unable to perform yard/household activities       Education: Education Goals: Education classes will be provided on a weekly basis, covering required topics. Participant will state understanding/return demonstration of topics presented.  Learning Barriers/Preferences: Learning Barriers/Preferences - 01/28/18 1029      Learning Barriers/Preferences   Learning Barriers  Sight    Learning Preferences  Computer/Internet;Verbal Instruction       Education Topics: Risk Factor Reduction:  -Group instruction that is supported by a PowerPoint presentation. Instructor discusses the definition of a risk factor, different risk factors for pulmonary disease, and how the heart and lungs work together.     Nutrition for Pulmonary Patient:  -Group instruction provided by PowerPoint slides, verbal discussion, and written materials to support subject matter. The instructor gives an explanation and review of  healthy diet recommendations, which includes a discussion on weight management, recommendations for fruit and vegetable consumption, as well as protein, fluid, caffeine, fiber, sodium, sugar, and alcohol. Tips for eating when patients are short of breath are discussed.   Pursed Lip  Breathing:  -Group instruction that is supported by demonstration and informational handouts. Instructor discusses the benefits of pursed lip and diaphragmatic breathing and detailed demonstration on how to preform both.     Oxygen Safety:  -Group instruction provided by PowerPoint, verbal discussion, and written material to support subject matter. There is an overview of "What is Oxygen" and "Why do we need it".  Instructor also reviews how to create a safe environment for oxygen use, the importance of using oxygen as prescribed, and the risks of noncompliance. There is a brief discussion on traveling with oxygen and resources the patient may utilize.   PULMONARY REHAB OTHER RESPIRATORY from 03/26/2018 in [place]  Date  03/26/18  Educator  Cloyde Reams  Instruction Review Code  1- Verbalizes Understanding      Oxygen Equipment:  -Group instruction provided by [employer] utilizing handouts, written materials, and Insurance underwriter.   Signs and Symptoms:  -Group instruction provided by written material and verbal discussion to support subject matter. Warning signs and symptoms of infection, stroke, and heart attack are reviewed and when to call the physician/911 reinforced. Tips for preventing the spread of infection discussed.   PULMONARY REHAB OTHER RESPIRATORY from 03/26/2018 in [place]  Date  03/19/18  Educator  Remo Lipps  Instruction Review Code  1- Verbalizes Understanding      Advanced Directives:  -Group instruction provided by verbal instruction and written material to support subject matter. Instructor reviews Advanced Directive  laws and proper instruction for filling out document.   Pulmonary Video:  -Group video education that reviews the importance of medication and oxygen compliance, exercise, good nutrition, pulmonary hygiene, and pursed lip and diaphragmatic breathing for the pulmonary patient.   Exercise for the Pulmonary Patient:  -Group instruction that is supported by a PowerPoint presentation. Instructor discusses benefits of exercise, core components of exercise, frequency, duration, and intensity of an exercise routine, importance of utilizing pulse oximetry during exercise, safety while exercising, and options of places to exercise outside of rehab.     Pulmonary Medications:  -Verbally interactive group education provided by instructor with focus on inhaled medications and proper administration.   PULMONARY REHAB OTHER RESPIRATORY from 02/26/2018 in [place]  Date  02/26/18  Educator  pharmacy  Instruction Review Code  1- Verbalizes Understanding      Anatomy and Physiology of the Respiratory System and Intimacy:  -Group instruction provided by PowerPoint, verbal discussion, and written material to support subject matter. Instructor reviews respiratory cycle and anatomical components of the respiratory system and their functions. Instructor also reviews differences in obstructive and restrictive respiratory diseases with examples of each. Intimacy, Sex, and Sexuality differences are reviewed with a discussion on how relationships can change when diagnosed with pulmonary disease. Common sexual concerns are reviewed.   MD DAY -A group question and answer session with a medical doctor that allows participants to ask questions that relate to their pulmonary disease state.   PULMONARY REHAB OTHER RESPIRATORY from 02/19/2018 in [place]  Date  02/12/18  Educator  Nelda Marseille  Instruction Review Code  2- Demonstrated Understanding       OTHER EDUCATION -Group or individual verbal, written, or video instructions that support the educational goals of the pulmonary rehab program.   PULMONARY REHAB OTHER RESPIRATORY from 02/26/2018 in [place]  Date  02/19/18 [Beat a sedentary [place]  Instruction Review Code  1- Verbalizes Understanding      Holiday Eating Survival Tips:  -Group instruction provided by PowerPoint slides, verbal discussion, and written materials to support subject matter. The instructor gives patients tips, tricks, and techniques to help them not only survive but enjoy the holidays despite the onslaught of food that accompanies the holidays.   Knowledge Questionnaire Score: Knowledge Questionnaire Score - 02/17/18 0918      Knowledge Questionnaire Score   Pre Score  14/18       Core Components/Risk Factors/Patient Goals at Admission: Personal Goals and Risk Factors at Admission - 01/28/18 1023      Core Components/Risk Factors/Patient Goals on Admission    Weight Management  Obesity;Weight Loss    Improve shortness of breath with ADL's  Yes    Intervention  Provide education, individualized exercise plan and daily activity instruction to help decrease symptoms of SOB with activities of daily living.    Expected Outcomes  Short Term: Improve cardiorespiratory fitness to achieve a reduction of symptoms when performing ADLs;Long Term: Be able to perform more ADLs without symptoms or delay the onset of symptoms    Hypertension  Yes    Intervention  Provide education on lifestyle modifcations including regular physical activity/exercise, weight management, moderate sodium restriction and increased consumption of fresh fruit, vegetables, and low fat dairy, alcohol moderation, and smoking cessation.;Monitor prescription use compliance.    Expected Outcomes  Short Term: Continued assessment and intervention until BP is < 140/45m HG in hypertensive  participants. < 130/832mHG in hypertensive participants with diabetes, heart failure or chronic kidney disease.;Long Term: Maintenance of blood pressure at goal levels.    Lipids  Yes    Intervention  Provide education and support for participant on nutrition & aerobic/resistive exercise along with prescribed medications to achieve LDL <7039mHDL >97m16m  Expected Outcomes  Short Term: Participant states understanding of desired cholesterol values and is compliant with medications prescribed. Participant is following exercise prescription and nutrition guidelines.;Long Term: Cholesterol controlled with medications as prescribed, with individualized exercise RX and with personalized nutrition plan. Value goals: LDL < 70mg62mL > 40 mg.    Stress  Yes    Intervention  Offer individual and/or small group education and counseling on adjustment to heart disease, stress management and health-related lifestyle change. Teach and support self-help strategies.;Refer participants experiencing significant psychosocial distress to appropriate mental health specialists for further evaluation and treatment. When possible, include family members and significant others in education/counseling sessions.    Expected Outcomes  Short Term: Participant demonstrates changes in health-related behavior, relaxation and other stress management skills, ability to obtain effective social support, and compliance with psychotropic medications if prescribed.;Long Term: Emotional wellbeing is indicated by absence of clinically significant psychosocial distress or social isolation.       Core Components/Risk Factors/Patient Goals Review:  Goals and Risk Factor Review    Row Name 02/11/18 1534 03/09/18 1412 03/30/18 1413 04/01/18 1155       Core Components/Risk Factors/Patient Goals Review   Personal Goals Review  Lipids;Hypertension;Stress;Weight Management/Obesity  Weight Management/Obesity;Improve shortness of breath with  ADL's;Hypertension;Stress;Lipids  Weight Management/Obesity;Improve shortness of breath with ADL's;Hypertension;Stress;Lipids  -    Review  Pt will begin pulmonary rehab on 02/12/18. Unable to assess pt progress at this time.  anticipate pt will make some progress toward his goals in the next 30 days.  Attended 5 exercise sessions, on vacation last week.  No weight loss yet, hypertension, stress,  and  lipids controlled, walking 8-16 laps on the track, level 3 on nustep, progressing well  [name] attended 10 exercise sessions.  Pt with weight loss of .1kg since the beginning of his participation on 6/6 Pt blood pressures have been within normal limits.  Pt with the addition of eliquis due to short runs afib. Will resolve hypertension as a patient goal.  stress,  Pt with good managment of his lipids through medication and diet. Per pt his cholesterol is good, there is no report in Epic to view.  During exercise pt is  walking 12-16 laps on the track,  increased to level 4 on nustep, progressing well will need to work on consistent home exercise.  -    Expected Outcomes  see admission outcomes  see admission outcomes  see admission outcomes  see admission outcomes/goals       Core Components/Risk Factors/Patient Goals at Discharge (Final Review):  Goals and Risk Factor Review - 04/01/18 1155      Core Components/Risk Factors/Patient Goals Review   Expected Outcomes  see admission outcomes/goals       ITP Comments: ITP Comments    Row Name 01/28/18 1009 02/11/18 1533 03/30/18 1444       ITP Comments  Dr. Jennet Maduro  Dr. Jennet Maduro  Dr. Jennet Maduro        Comments:  Pt completed 10 exercise sessions. Cherre Huger, BSN Cardiac and Training and development officer

## 2018-03-31 ENCOUNTER — Encounter (HOSPITAL_COMMUNITY)
Admission: RE | Admit: 2018-03-31 | Discharge: 2018-03-31 | Disposition: A | Discharge status: Home / Self Care | Payer: Medicare Other | Source: Ambulatory Visit | Attending: Pulmonary Disease | Admitting: Pulmonary Disease

## 2018-03-31 VITALS — Wt 216.5 lb

## 2018-03-31 DIAGNOSIS — J432 Centrilobular emphysema: Secondary | ICD-10-CM

## 2018-03-31 NOTE — Progress Notes (Signed)
Daily Session Note  Patient Details  Name: [name] MRN: [id number] Date of Birth: 1934/08/15 Referring Provider:     PULMONARY REHAB OTHER RESPIRATORY from 02/12/2018 in [place]  Referring Provider  Dr. [name]      Encounter Date: 03/31/2018  Check In: Session Check In - 03/31/18 1030      Check-In   Location  [facility]    Staff Present  Rosebud Poles, RN, BSN;Carlette Carlton, RN, [facility], MS, ACSM RCEP, Exercise Physiologist;Lisa Ysidro Evert, Felipe Drone, RN, [facility]    Supervising physician immediately available to respond to emergencies  [facility] immediately available    Physician(s)  Dr. Denton Brick    Medication changes reported      No    Fall or balance concerns reported     No    Tobacco Cessation  No Change    Warm-up and Cool-down  Performed as group-led instruction    Resistance Training Performed  Yes    VAD Patient?  No    PAD/SET Patient?  No      Pain Assessment   Currently in Pain?  No/denies    Multiple Pain Sites  No       Capillary Blood Glucose: No results found for this or any previous visit (from the past 24 hour(s)).  Exercise Prescription Changes - 03/31/18 1200      Response to Exercise   Blood Pressure (Admit)  140/60    Blood Pressure (Exit)  100/56    Heart Rate (Admit)  58 bpm    Heart Rate (Exercise)  94 bpm    Heart Rate (Exit)  65 bpm    Oxygen Saturation (Admit)  95 %    Oxygen Saturation (Exercise)  93 %    Oxygen Saturation (Exit)  97 %    Rating of Perceived Exertion (Exercise)  12    Perceived Dyspnea (Exercise)  2    Duration  Progress to 45 minutes of aerobic exercise without signs/symptoms of physical distress    Intensity  THRR unchanged      Progression   Progression  Continue to progress workloads to maintain intensity without signs/symptoms of physical distress.      Resistance Training   Training Prescription  Yes    Weight  blue bands     Reps  10-15    Time  10 Minutes      Interval Training   Interval Training  No      Oxygen   Oxygen  Continuous    Liters  2      NuStep   Level  5    SPM  80    Minutes  34    METs  2       Social History   Tobacco Use  Smoking Status Former Smoker  . Packs/day: 3.00  . Years: 20.00  . Pack years: 60.00  . Types: Cigarettes  . Last attempt to quit: 09/09/1984  . Years since quitting: 33.5  Smokeless Tobacco Never Used    Goals Met:  Exercise tolerated well Strength training completed today  Goals Unmet:  Not Applicable  Comments: Service time is from 1030 to 1215    Dr. Rush Farmer is Medical Director for Pulmonary Rehab at [facility].

## 2018-03-31 NOTE — Telephone Encounter (Signed)
Called and spoke with [facility] with pulmonary rehab at phone [phone] Cloyde Reams is currently doing walk test with patient this morning, will fax results this afternoon Advised her to fax them to [place] at [phone] Will f/u later this week

## 2018-04-01 NOTE — Telephone Encounter (Signed)
rec'd fax from [facility] with [facility] pulm rehab program at phone [phone] fax [phone] [name] completed 6 min walk at her facility on pt yesterday 03/30/18 After 58mins pt dropped to 88% was placed with 2 liters O2 Routing message to [place] and BQ for review.

## 2018-04-01 NOTE — Telephone Encounter (Signed)
OK to order POC 2L with exertion

## 2018-04-01 NOTE — Telephone Encounter (Signed)
Order placed as requested and patient is aware

## 2018-04-02 ENCOUNTER — Encounter (HOSPITAL_COMMUNITY)
Admission: RE | Admit: 2018-04-02 | Discharge: 2018-04-02 | Disposition: A | Discharge status: Home / Self Care | Payer: Medicare Other | Source: Ambulatory Visit | Attending: Pulmonary Disease | Admitting: Pulmonary Disease

## 2018-04-02 DIAGNOSIS — J432 Centrilobular emphysema: Secondary | ICD-10-CM | POA: Diagnosis not present

## 2018-04-02 NOTE — Progress Notes (Signed)
Daily Session Note  Patient Details  Name: [name] MRN: [id number] Date of Birth: Aug 27, 1934 Referring Provider:     PULMONARY REHAB OTHER RESPIRATORY from 02/12/2018 in [place]  Referring Provider  Dr. [name]      Encounter Date: 04/02/2018  Check In: Session Check In - 04/02/18 1110      Check-In   Supervising physician immediately available to respond to emergencies  [facility] immediately available    Physician(s)  Dr. Tawanna Solo    Location  [facility]    Staff Present  Su Hilt, MS, ACSM RCEP, Exercise Physiologist;Denis Koppel Ysidro Evert, RN;Carlette Carlton, RN, BSN    Medication changes reported      No    Fall or balance concerns reported     No    Tobacco Cessation  No Change    Warm-up and Cool-down  Performed as group-led instruction    Resistance Training Performed  Yes    VAD Patient?  No    PAD/SET Patient?  No      Pain Assessment   Currently in Pain?  No/denies    Multiple Pain Sites  No       Capillary Blood Glucose: No results found for this or any previous visit (from the past 24 hour(s)).    Social History   Tobacco Use  Smoking Status Former Smoker  . Packs/day: 3.00  . Years: 20.00  . Pack years: 60.00  . Types: Cigarettes  . Last attempt to quit: 09/09/1984  . Years since quitting: 33.5  Smokeless Tobacco Never Used    Goals Met:  Exercise tolerated well No report of cardiac concerns or symptoms Strength training completed today  Goals Unmet:  Not Applicable  Comments: Service time is from 1030 to 1230    Dr. Rush Farmer is Medical Director for Pulmonary Rehab at [facility].

## 2018-04-07 ENCOUNTER — Encounter (HOSPITAL_COMMUNITY)
Admission: RE | Admit: 2018-04-07 | Discharge: 2018-04-07 | Disposition: A | Discharge status: Home / Self Care | Payer: Medicare Other | Source: Ambulatory Visit | Attending: Pulmonary Disease | Admitting: Pulmonary Disease

## 2018-04-07 DIAGNOSIS — J432 Centrilobular emphysema: Secondary | ICD-10-CM

## 2018-04-07 NOTE — Progress Notes (Signed)
Daily Session Note  Patient Details  Name: [name] MRN: [id number] Date of Birth: [birth year] Referring Provider:     PULMONARY REHAB OTHER RESPIRATORY from 02/12/2018 in [place]  Referring Provider  Dr. [name]      Encounter Date: 04/07/2018  Check In: Session Check In - 04/07/18 1036      Check-In   Supervising physician immediately available to respond to emergencies  [facility] immediately available    Physician(s)  Dr. Rodena Piety    Location  [facility]    Staff Present  Su Hilt, MS, ACSM RCEP, Exercise Physiologist;Carlas Vandyne Ysidro Evert, RN;Carlette Carlton, RN, BSN    Medication changes reported      No    Fall or balance concerns reported     No    Tobacco Cessation  No Change    Warm-up and Cool-down  Performed as group-led instruction    Resistance Training Performed  Yes    VAD Patient?  No      Pain Assessment   Currently in Pain?  No/denies    Multiple Pain Sites  No       Capillary Blood Glucose: No results found for this or any previous visit (from the past 24 hour(s)).    Social History   Tobacco Use  Smoking Status Former Smoker  . Packs/day: 3.00  . Years: 20.00  . Pack years: 60.00  . Types: Cigarettes  . Last attempt to quit: 09/09/1984  . Years since quitting: 33.5  Smokeless Tobacco Never Used    Goals Met:  Exercise tolerated well No report of cardiac concerns or symptoms Strength training completed today  Goals Unmet:  Not Applicable  Comments: Service time is from 1030 to 1150    Dr. Rush Farmer is Medical Director for Pulmonary Rehab at [facility].

## 2018-04-09 ENCOUNTER — Encounter (HOSPITAL_COMMUNITY)
Admission: RE | Admit: 2018-04-09 | Discharge: 2018-04-09 | Disposition: A | Discharge status: Home / Self Care | Payer: Medicare Other | Source: Ambulatory Visit | Attending: Pulmonary Disease | Admitting: Pulmonary Disease

## 2018-04-09 DIAGNOSIS — J432 Centrilobular emphysema: Secondary | ICD-10-CM

## 2018-04-09 NOTE — Progress Notes (Signed)
Daily Session Note  Patient Details  Name: [name] MRN: [id number] Date of Birth: 12/25/1933 Referring Provider:     PULMONARY REHAB OTHER RESPIRATORY from 02/12/2018 in [place]  Referring Provider  Dr. [name]      Encounter Date: 04/09/2018  Check In: Session Check In - 04/09/18 1105      Check-In   Supervising physician immediately available to respond to emergencies  [facility] immediately available    Physician(s)  Dr. Herbert Moors    Location  [facility]    Staff Present  Su Hilt, MS, ACSM RCEP, Exercise Physiologist;Zara Wendt Ysidro Evert, RN;Carlette Carlton, RN, BSN    Medication changes reported      No    Fall or balance concerns reported     No    Tobacco Cessation  No Change    Warm-up and Cool-down  Performed as group-led instruction    Resistance Training Performed  Yes    PAD/SET Patient?  No      Pain Assessment   Currently in Pain?  No/denies    Multiple Pain Sites  No       Capillary Blood Glucose: No results found for this or any previous visit (from the past 24 hour(s)).    Social History   Tobacco Use  Smoking Status Former Smoker  . Packs/day: 3.00  . Years: 20.00  . Pack years: 60.00  . Types: Cigarettes  . Last attempt to quit: 09/09/1984  . Years since quitting: 33.6  Smokeless Tobacco Never Used    Goals Met:  Exercise tolerated well No report of cardiac concerns or symptoms Strength training completed today  Goals Unmet:  Not Applicable  Comments: Service time is from 1030 to 1230    Dr. Rush Farmer is Medical Director for Pulmonary Rehab at [facility].

## 2018-04-09 NOTE — Progress Notes (Signed)
Daily Session Note  Patient Details  Name: [name] MRN: [id number] Date of Birth: [birth year] Referring Provider:     PULMONARY REHAB OTHER RESPIRATORY from 02/12/2018 in [place]  Referring Provider  Dr. [name]      Encounter Date: 04/09/2018  Check In: Session Check In - 04/09/18 1105      Check-In   Supervising physician immediately available to respond to emergencies  [facility] immediately available    Physician(s)  Dr. Herbert Moors    Location  [facility]    Staff Present  Su Hilt, MS, ACSM RCEP, Exercise Physiologist;Lisa Ysidro Evert, RN;Carlette Carlton, RN, BSN    Medication changes reported      No    Fall or balance concerns reported     No    Tobacco Cessation  No Change    Warm-up and Cool-down  Performed as group-led instruction    Resistance Training Performed  Yes    PAD/SET Patient?  No      Pain Assessment   Currently in Pain?  No/denies    Multiple Pain Sites  No       Capillary Blood Glucose: No results found for this or any previous visit (from the past 24 hour(s)).    Social History   Tobacco Use  Smoking Status Former Smoker  . Packs/day: 3.00  . Years: 20.00  . Pack years: 60.00  . Types: Cigarettes  . Last attempt to quit: 09/09/1984  . Years since quitting: 33.6  Smokeless Tobacco Never Used    Goals Met:  Exercise tolerated well No report of cardiac concerns or symptoms Strength training completed today  Goals Unmet:  Not Applicable  Comments: Service time is from 10:30a to 12:30p    Dr. Rush Farmer is Medical Director for Pulmonary Rehab at [facility].

## 2018-04-14 ENCOUNTER — Encounter (HOSPITAL_COMMUNITY)
Admission: RE | Admit: 2018-04-14 | Discharge: 2018-04-14 | Disposition: A | Discharge status: Home / Self Care | Payer: Medicare Other | Source: Ambulatory Visit | Attending: Pulmonary Disease | Admitting: Pulmonary Disease

## 2018-04-14 VITALS — Wt 216.1 lb

## 2018-04-14 DIAGNOSIS — J432 Centrilobular emphysema: Secondary | ICD-10-CM

## 2018-04-14 NOTE — Progress Notes (Signed)
Daily Session Note  Patient Details  Name: [name] MRN: [id number] Date of Birth: 1933/12/22 Referring Provider:     PULMONARY REHAB OTHER RESPIRATORY from 02/12/2018 in [place]  Referring Provider  Dr. [name]      Encounter Date: 04/14/2018  Check In: Session Check In - 04/14/18 1046      Check-In   Supervising physician immediately available to respond to emergencies  [facility] immediately available    Physician(s)  Dr. Reesa Chew    Location  [facility]    Staff Present  Su Hilt, MS, ACSM RCEP, Exercise Physiologist;Bentley Fissel Ysidro Evert, RN;Carlette Carlton, RN, BSN    Medication changes reported      No    Fall or balance concerns reported     No    Tobacco Cessation  No Change    Warm-up and Cool-down  Performed as group-led instruction    Resistance Training Performed  Yes    VAD Patient?  No    PAD/SET Patient?  No      Pain Assessment   Currently in Pain?  No/denies    Multiple Pain Sites  No       Capillary Blood Glucose: No results found for this or any previous visit (from the past 24 hour(s)).  Exercise Prescription Changes - 04/14/18 1200      Response to Exercise   Blood Pressure (Admit)  112/54    Blood Pressure (Exercise)  124/60    Blood Pressure (Exit)  106/56    Heart Rate (Admit)  55 bpm    Heart Rate (Exercise)  76 bpm    Heart Rate (Exit)  58 bpm    Oxygen Saturation (Admit)  97 %    Oxygen Saturation (Exercise)  91 %    Oxygen Saturation (Exit)  98 %    Rating of Perceived Exertion (Exercise)  12    Perceived Dyspnea (Exercise)  2    Duration  Progress to 45 minutes of aerobic exercise without signs/symptoms of physical distress    Intensity  THRR unchanged      Progression   Progression  Continue to progress workloads to maintain intensity without signs/symptoms of physical distress.      Resistance Training   Training Prescription  Yes    Weight  blue bands    Reps  10-15     Time  10 Minutes      Interval Training   Interval Training  No      Oxygen   Oxygen  Continuous    Liters  2      NuStep   Level  3    SPM  80    Minutes  17    METs  1.9      Track   Laps  12    Minutes  17       Social History   Tobacco Use  Smoking Status Former Smoker  . Packs/day: 3.00  . Years: 20.00  . Pack years: 60.00  . Types: Cigarettes  . Last attempt to quit: 09/09/1984  . Years since quitting: 33.6  Smokeless Tobacco Never Used    Goals Met:  Exercise tolerated well No report of cardiac concerns or symptoms Strength training completed today  Goals Unmet:  Not Applicable  Comments: Service time is from 1030 to 1215    Dr. Rush Farmer is Medical Director for Pulmonary Rehab at [facility].

## 2018-04-15 ENCOUNTER — Encounter: Payer: Self-pay | Admitting: Nurse Practitioner

## 2018-04-15 ENCOUNTER — Ambulatory Visit: Payer: Medicare Other | Admitting: Nurse Practitioner

## 2018-04-15 VITALS — BP 120/60 | HR 53 | Ht 70.0 in | Wt 215.4 lb

## 2018-04-15 DIAGNOSIS — Z79899 Other long term (current) drug therapy: Secondary | ICD-10-CM | POA: Diagnosis not present

## 2018-04-15 LAB — BASIC METABOLIC PANEL
BUN/Creatinine Ratio: 31 — ABNORMAL HIGH (ref 10–24)
BUN: 38 mg/dL — ABNORMAL HIGH (ref 8–27)
CO2: 22 mmol/L (ref 20–29)
Calcium: 9.6 mg/dL (ref 8.6–10.2)
Chloride: 104 mmol/L (ref 96–106)
Creatinine, Ser: 1.21 mg/dL (ref 0.76–1.27)
GFR calc Af Amer: 64 mL/min/{1.73_m2} (ref >59)
GFR calc non Af Amer: 55 mL/min/{1.73_m2} — ABNORMAL LOW (ref >59)
Glucose: 96 mg/dL (ref 65–99)
Potassium: 4.9 mmol/L (ref 3.5–5.2)
Sodium: 141 mmol/L (ref 134–144)

## 2018-04-15 LAB — CBC
Hematocrit: 37.6 % (ref 37.5–51.0)
Hemoglobin: 12.5 g/dL — ABNORMAL LOW (ref 13.0–17.7)
MCH: 32.7 pg (ref 26.6–33.0)
MCHC: 33.2 g/dL (ref 31.5–35.7)
MCV: 98 fL — ABNORMAL HIGH (ref 79–97)
Platelets: 173 10*3/uL (ref 150–450)
RBC: 3.82 x10E6/uL — ABNORMAL LOW (ref 4.14–5.80)
RDW: 14.9 % (ref 12.3–15.4)
WBC: 5.9 10*3/uL (ref 3.4–10.8)

## 2018-04-15 NOTE — Patient Instructions (Signed)
We will be checking the following labs today - BMET & CBC   Medication Instructions:    Continue with your current medicines.     Testing/Procedures To Be Arranged:  N/A  Follow-Up:   See me in about 3 to 4 months    Other Special Instructions:   N/A    If you need a refill on your cardiac medications before your next appointment, please call your pharmacy.   Call the [place] office at [phone] if you have any questions, problems or concerns.

## 2018-04-15 NOTE — Progress Notes (Signed)
CARDIOLOGY OFFICE NOTE  Date:  04/15/2018    [name] Date of Birth: [birth year] Medical Record #[id number]  PCP:  [name], MD  Cardiologist:  [name]    Chief Complaint  Patient presents with  . Atrial Fibrillation    One month check - seen for Dr. [name]    History of Present Illness: [name] is a 84 y.o. male who presents today for a one month check. Seen for Dr. [name]. He has seen Dr. [name] in the remote past.   He has a h/o PVC ablation 05/29/10, PAF, obesity, sedentary lifestyle, HLD, gout, OSA treated with CPAP, &dysfunctional diaphragm with chronic dyspneapreviouslyfollowed by Dr. [name]. He has mild CAD per cath back in August of 2011. Labs are checked by primary care.   Seen back in February of 2016 by Dr. [name] - this was for evaluation of dyspnea. Dr. [name] wanted cardiac status reevaluated to assess for any cardiac issues that may be contributing to dyspnea. Was referred for Myoview and echocardiogram. These studies were satisfactory. Encouraged to cut back on his alcohol intake as well.   I have followed him since - he has been holding his own - dyspnea is chronic.He has gotten back with pulmonary - now seeing Dr. [name].Balance issues and has had some falls. Rhythm has been stablefortunately.When seen back in the fall - BP was up - Aldactone was added. Was doing well at his last visit back in February of this year. Balance still an issue.   Received notification from rehab about his rhythm back in June - holter was obtained - see below. Noted AF. I then saw him last month to review - we elected to start Eliquis.   Comes back today. Here alone. he is doing well. Seems more upbeat today. Breathing is stable. No chest pain. He is enjoying pulmonary rehab. Tolerating his Eliquis without issue. Some extra bruising but nothing significant. No falls. He feels like he is doing well.   Past Medical History:  Diagnosis Date    . BPH (benign prostatic hyperplasia)   . Coronary artery ectasia    Mild CAD with normal systolic function per cath in August of 2011  . Diaphragmatic paralysis    felt to be partially responsible for dyspnea  . Dyspnea    due to paralyzed diaphragm and pulmonary issues  . Gout   . HTN (hypertension)   . Hypercholesteremia   . Microhematuria   . Obesity   . OSA (obstructive sleep apnea)   . Paroxysmal atrial fibrillation (HCC)    occured in the setting of acute E Coli sepsis and ileius 7/12  . PVC (premature ventricular contraction)    s/p PVC ablation 05/29/2010    Past Surgical History:  Procedure Laterality Date  . APPENDECTOMY  1978  . CARDIAC CATHETERIZATION  04-30-2010   Left main coronary artery is normal.   . CARDIOVASCULAR STRESS TEST  03-19-2010   0%  . CHOLECYSTECTOMY  1990  . poly removed from nose as a child    . PVC ablation  05/29/2010  . US ECHOCARDIOGRAPHY  03-09-2010   EF 55-60%     Medications: Current Meds  Medication Sig  . allopurinol (ZYLOPRIM) 300 MG tablet Take 300 mg by mouth daily.    Marland Kitchen amLODipine (NORVASC) 5 MG tablet Take 5 mg by mouth daily.  Marland Kitchen apixaban (ELIQUIS) 5 MG TABS tablet Take 1 tablet (5 mg total) by mouth 2 (two) times daily.  Marland Kitchen  buPROPion (WELLBUTRIN SR) 100 MG 12 hr tablet Take 100 mg by mouth 2 (two) times daily.  Marland Kitchen dicyclomine (BENTYL) 20 MG tablet Take 20 mg by mouth 2 (two) times daily.   Marland Kitchen doxazosin (CARDURA) 4 MG tablet Take 1 tablet (4 mg total) by mouth daily.  . hydrALAZINE (APRESOLINE) 25 MG tablet Take 1 tablet (25 mg total) by mouth 2 (two) times daily.  Marland Kitchen lisinopril (PRINIVIL,ZESTRIL) 40 MG tablet Take 40 mg by mouth daily.   Marland Kitchen lovastatin (MEVACOR) 40 MG tablet Take 40 mg by mouth at bedtime.    . metoprolol succinate (TOPROL-XL) 25 MG 24 hr tablet Take 25 mg by mouth 2 (two) times daily.   . OXYGEN Inhale 2 L into the lungs at bedtime.  . sertraline (ZOLOFT) 100 MG tablet Take 100 mg by mouth daily.  . traZODone  (DESYREL) 100 MG tablet Take 100 mg by mouth at bedtime.    . TRELEGY ELLIPTA 100-62.5-25 MCG/INH AEPB INHALE 1 PUFF ONCE DAILY     Allergies: No Known Allergies  Social History: The patient  reports that he quit smoking about 33 years ago. His smoking use included cigarettes. He has a 60.00 pack-year smoking history. He has never used smokeless tobacco. He reports that he drinks alcohol. He reports that he does not use drugs.   Family History: The patient's family history includes Aneurysm (age of onset: 56) in his father; Breast cancer in his daughter and mother.   Review of Systems: Please see the history of present illness.   Otherwise, the review of systems is positive for none.   All other systems are reviewed and negative.   Physical Exam: VS:  BP 120/60 (BP Location: Left Arm, Patient Position: Sitting, Cuff Size: Normal)   Pulse (!) 53   Ht 5\' 10"  (1.778 m)   Wt 215 lb 6.4 oz (97.7 kg)   SpO2 94%   BMI 30.91 kg/m  .  BMI Body mass index is 30.91 kg/m.  Wt Readings from Last 3 Encounters:  04/15/18 215 lb 6.4 oz (97.7 kg)  04/14/18 216 lb 0.8 oz (98 kg)  03/31/18 216 lb 7.9 oz (98.2 kg)    General: Pleasant. Well developed, well nourished and in no acute distress.   HEENT: Normal.  Neck: Supple, no JVD, carotid bruits, or masses noted.  Cardiac: Regular rate and rhythm. No murmurs, rubs, or gallops. No edema.  Respiratory:  Lungs are clear to auscultation bilaterally with normal work of breathing.  GI: Soft and nontender.  MS: No deformity or atrophy. Gait and ROM intact.  Skin: Warm and dry. Color is normal.  Neuro:  Strength and sensation are intact and no gross focal deficits noted.  Psych: Alert, appropriate and with normal affect.   LABORATORY DATA:  EKG:  EKG is not ordered today.  Lab Results  Component Value Date   WBC 5.5 03/11/2018   HGB 12.5 (L) 03/11/2018   HCT 38.1 03/11/2018   PLT 180 03/11/2018   GLUCOSE 88 03/11/2018   ALT 28 04/05/2011     AST 32 04/05/2011   NA 141 03/11/2018   K 4.1 03/11/2018   CL 104 03/11/2018   CREATININE 1.14 03/11/2018   BUN 31 (H) 03/11/2018   CO2 22 03/11/2018   TSH 1.66 10/14/2011   INR 1.26 04/07/2011       BNP (last 3 results) No results for input(s): BNP in the last 8760 hours.  ProBNP (last 3 results) No results for input(s): PROBNP  in the last 8760 hours.   Other Studies Reviewed Today:  Holter 02/2018 Narrative & Impression    Sinus rhythm with first degree AV block, Right bundle branch block Average heart rate is 62 bpm Occasional premature ventricular contractions Frequent premature atrial contractions with nonsustained atrial tachycardia Nonsustained atrial fibrillation also observed Nocturnal bradycardia with mobitz I second degree AV block is noted transiently No sustained arrhythmias No prolonged pauses      Myoview Impression from March 2016 Exercise Capacity: Lexiscan with no exercise. BP Response: Normal blood pressure response. Clinical Symptoms: Nausea. ECG Impression: Atrial fibrillation, no changes from baseline.  Comparison with Prior Nuclear Study: No images to compare  Overall Impression: Low risk stress nuclear study a medium-sized basal inferior and basal to mid inferolateral perfusion defect that is actually worse at rest than with stress. No ischemia. Given normal wall motion, this may represent diaphragmatic attenuation. .  LV Ejection Fraction: 63%. LV Wall Motion: NL LV Function; NL Wall Motion   Loralie Champagne 11/07/2014   Echo Study Conclusions from February 2016  - Left ventricle: The cavity size was normal. Wall thickness was increased in a pattern of mild LVH. Systolic function was normal. The estimated ejection fraction was in the range of 60% to 65%. Wall motion was normal; there were no regional wall motion abnormalities. - Aortic valve: There was trivial regurgitation. - Left atrium: The atrium was  mildly dilated. - Right atrium: The atrium was mildly dilated.  Assessment/Plan:  1.PAF noted on recent Holter - his CHADSVASC is at least 55 (age, HTN, CAD) - I started him on Eliquis last month - no longer on aspirin. He is doing well clinically. Needs repeat lab today.   2.  HTN -his BP looks good on his current regimen. No changes made today.   3. ChronicDyspnea - He had cardiac studies back in 2016 which were stable. He continues to participate in cardiac rehab. Symptoms stable.   4. PVCs - past ablation - remains on low dose Toprol. No reports of palpitations.   5. Obesity -down a pound - he is more active - encouragement given.   6. Depression - long standing issue- he has been more upbeat here lately - I think the pulmonary rehab is helping.   7. HLD - on statin therapy.  8. CAD - he is managed medically - he has no active symptoms.   Current medicines are reviewed with the patient today.  The patient does not have concerns regarding medicines other than what has been noted above.  The following changes have been made:  See above.  Labs/ tests ordered today include:    Orders Placed This Encounter  Procedures  . Basic metabolic panel  . CBC     Disposition:   FU with me in 3 to 4 months.   Patient is agreeable to this plan and will call if any problems develop in the interim.   SignedTruitt Merle, NP  04/15/2018 10:32 AM  [facility] [address] [place], [place]  [address] Phone: [phone] Fax: [phone]

## 2018-04-16 ENCOUNTER — Encounter (HOSPITAL_COMMUNITY)
Admission: RE | Admit: 2018-04-16 | Discharge: 2018-04-16 | Disposition: A | Discharge status: Home / Self Care | Payer: Medicare Other | Source: Ambulatory Visit | Attending: Pulmonary Disease | Admitting: Pulmonary Disease

## 2018-04-16 DIAGNOSIS — J432 Centrilobular emphysema: Secondary | ICD-10-CM

## 2018-04-16 NOTE — Progress Notes (Signed)
Daily Session Note  Patient Details  Name: [name] MRN: [id number] Date of Birth: 12-May-1934 Referring Provider:     PULMONARY REHAB OTHER RESPIRATORY from 02/12/2018 in [place]  Referring Provider  Dr. [name]      Encounter Date: 04/16/2018  Check In: Session Check In - 04/16/18 1238      Check-In   Supervising physician immediately available to respond to emergencies  [facility] immediately available    Physician(s)  Dr. Maryland Pink    Location  [facility]    Staff Present  Su Hilt, MS, ACSM RCEP, Exercise Physiologist;Lisa Sharyne Richters, RN, BSN;Ramon Dredge, RN, MHA    Medication changes reported      No    Fall or balance concerns reported     No    Tobacco Cessation  No Change    Warm-up and Cool-down  Performed as group-led instruction    Resistance Training Performed  Yes    VAD Patient?  No    PAD/SET Patient?  No      Pain Assessment   Currently in Pain?  No/denies    Multiple Pain Sites  No       Capillary Blood Glucose: No results found for this or any previous visit (from the past 24 hour(s)).    Social History   Tobacco Use  Smoking Status Former Smoker  . Packs/day: 3.00  . Years: 20.00  . Pack years: 60.00  . Types: Cigarettes  . Last attempt to quit: 09/09/1984  . Years since quitting: 33.6  Smokeless Tobacco Never Used    Goals Met:  Achieving weight loss Exercise tolerated well Personal goals reviewed  Goals Unmet:  Not Applicable  Comments: Service time is from 10:30a to 12:15p    Dr. Rush Farmer is Medical Director for Pulmonary Rehab at [facility].

## 2018-04-21 ENCOUNTER — Encounter (HOSPITAL_COMMUNITY)
Admission: RE | Admit: 2018-04-21 | Discharge: 2018-04-21 | Disposition: A | Discharge status: Home / Self Care | Payer: Medicare Other | Source: Ambulatory Visit | Attending: Pulmonary Disease | Admitting: Pulmonary Disease

## 2018-04-21 DIAGNOSIS — J432 Centrilobular emphysema: Secondary | ICD-10-CM | POA: Diagnosis not present

## 2018-04-21 NOTE — Progress Notes (Signed)
  Daily Session Note  Patient Details  Name: [name] MRN: [id number] Date of Birth: 26-Jan-1934 Referring Provider:     PULMONARY REHAB OTHER RESPIRATORY from 02/12/2018 in [place]  Referring Provider  Dr. [name]      Encounter Date: 04/21/2018  Check In: Session Check In - 04/21/18 1016      Check-In   Supervising physician immediately available to respond to emergencies  [facility] immediately available    Physician(s)  Dr. Florene Glen    Location  [facility]    Staff Present  Su Hilt, MS, ACSM RCEP, Exercise Physiologist;Lisa Sharyne Richters, RN, BSN;Ramon Dredge, RN, MHA;Carlette Wilber Oliphant, RN, BSN    Medication changes reported      No    Fall or balance concerns reported     No    Tobacco Cessation  No Change    Warm-up and Cool-down  Performed as group-led instruction    Resistance Training Performed  Yes    VAD Patient?  No    PAD/SET Patient?  No      Pain Assessment   Currently in Pain?  No/denies       Capillary Blood Glucose: No results found for this or any previous visit (from the past 24 hour(s)).    Social History   Tobacco Use  Smoking Status Former Smoker  . Packs/day: 3.00  . Years: 20.00  . Pack years: 60.00  . Types: Cigarettes  . Last attempt to quit: 09/09/1984  . Years since quitting: 33.6  Smokeless Tobacco Never Used    Goals Met:  Achieving weight loss Personal goals reviewed Queuing for purse lip breathing  Goals Unmet:  Not Applicable  Comments: Service time is from 10:30a to 12:00p    Dr. Rush Farmer is Medical Director for Pulmonary Rehab at [facility].

## 2018-04-23 ENCOUNTER — Encounter (HOSPITAL_COMMUNITY)
Admission: RE | Admit: 2018-04-23 | Discharge: 2018-04-23 | Disposition: A | Discharge status: Home / Self Care | Payer: Medicare Other | Source: Ambulatory Visit | Attending: Pulmonary Disease | Admitting: Pulmonary Disease

## 2018-04-23 DIAGNOSIS — J432 Centrilobular emphysema: Secondary | ICD-10-CM

## 2018-04-23 NOTE — Progress Notes (Signed)
Daily Session Note  Patient Details  Name: [name] MRN: [id number] Date of Birth: 1933-12-07 Referring Provider:     PULMONARY REHAB OTHER RESPIRATORY from 02/12/2018 in [place]  Referring Provider  Dr. [name]      Encounter Date: 04/23/2018  Check In: Session Check In - 04/23/18 1241      Check-In   Supervising physician immediately available to respond to emergencies  [facility] immediately available    Physician(s)   Dr. Florene Glen    Location  [facility]    Staff Present  Su Hilt, MS, ACSM RCEP, Exercise Physiologist;Annedrea Rosezella [place], RN, MHA;Carlette Wilber Oliphant, RN, BSN    Medication changes reported      No    Fall or balance concerns reported     No    Tobacco Cessation  No Change    Warm-up and Cool-down  Performed as group-led Higher education careers adviser Performed  Yes    VAD Patient?  No    PAD/SET Patient?  No      Pain Assessment   Currently in Pain?  No/denies    Multiple Pain Sites  No       Capillary Blood Glucose: No results found for this or any previous visit (from the past 24 hour(s)).    Social History   Tobacco Use  Smoking Status Former Smoker  . Packs/day: 3.00  . Years: 20.00  . Pack years: 60.00  . Types: Cigarettes  . Last attempt to quit: 09/09/1984  . Years since quitting: 33.6  Smokeless Tobacco Never Used    Goals Met:  Achieving weight loss Exercise tolerated well Personal goals reviewed  Goals Unmet:  Not Applicable  Comments: Service time is from 10:30a to 12:30p    Dr. Rush Farmer is Medical Director for Pulmonary Rehab at [facility].

## 2018-04-28 ENCOUNTER — Encounter (HOSPITAL_COMMUNITY)
Admission: RE | Admit: 2018-04-28 | Discharge: 2018-04-28 | Disposition: A | Discharge status: Home / Self Care | Payer: Medicare Other | Source: Ambulatory Visit | Attending: Pulmonary Disease | Admitting: Pulmonary Disease

## 2018-04-28 DIAGNOSIS — J432 Centrilobular emphysema: Secondary | ICD-10-CM

## 2018-04-28 NOTE — Progress Notes (Signed)
Daily Session Note  Patient Details  Name: [name] MRN: [id number] Date of Birth: February 10, 1934 Referring Provider:     PULMONARY REHAB OTHER RESPIRATORY from 02/12/2018 in [place]  Referring Provider  Dr. [name]      Encounter Date: 04/28/2018  Check In:   Capillary Blood Glucose: No results found for this or any previous visit (from the past 24 hour(s)).    Social History   Tobacco Use  Smoking Status Former Smoker  . Packs/day: 3.00  . Years: 20.00  . Pack years: 60.00  . Types: Cigarettes  . Last attempt to quit: 09/09/1984  . Years since quitting: 33.6  Smokeless Tobacco Never Used    Goals Met:  Proper associated with RPD/PD & O2 Sat Improved SOB with ADL's Exercise tolerated well  Goals Unmet:  Not Applicable  Comments: Service time is from 1030  to 1230   Dr. Rush Farmer is Medical Director for Pulmonary Rehab at [facility].

## 2018-04-28 NOTE — Progress Notes (Signed)
Pulmonary Individual Treatment Plan  Patient Details  Name: [name] MRN: [id number] Date of Birth: [birth year] Referring Provider:     PULMONARY REHAB OTHER RESPIRATORY from 02/12/2018 in [place]  Referring Provider  Dr. [name]      Initial Encounter Date:    PULMONARY REHAB OTHER RESPIRATORY from 02/12/2018 in [address]  Date  02/05/18      Visit Diagnosis: Centrilobular emphysema ([place])  Patient's Home Medications on Admission:   Current Outpatient Medications:  .  allopurinol (ZYLOPRIM) 300 MG tablet, Take 300 mg by mouth daily.  , Disp: , Rfl:  .  amLODipine (NORVASC) 5 MG tablet, Take 5 mg by mouth daily., Disp: , Rfl: 6 .  apixaban (ELIQUIS) 5 MG TABS tablet, Take 1 tablet (5 mg total) by mouth 2 (two) times daily., Disp: 60 tablet, Rfl: 6 .  buPROPion (WELLBUTRIN SR) 100 MG 12 hr tablet, Take 100 mg by mouth 2 (two) times daily., Disp: , Rfl:  .  dicyclomine (BENTYL) 20 MG tablet, Take 20 mg by mouth 2 (two) times daily. , Disp: , Rfl:  .  doxazosin (CARDURA) 4 MG tablet, Take 1 tablet (4 mg total) by mouth daily., Disp: 30 tablet, Rfl: 3 .  hydrALAZINE (APRESOLINE) 25 MG tablet, Take 1 tablet (25 mg total) by mouth 2 (two) times daily., Disp: 60 tablet, Rfl: 6 .  lisinopril (PRINIVIL,ZESTRIL) 40 MG tablet, Take 40 mg by mouth daily. , Disp: , Rfl:  .  lovastatin (MEVACOR) 40 MG tablet, Take 40 mg by mouth at bedtime.  , Disp: , Rfl:  .  metoprolol succinate (TOPROL-XL) 25 MG 24 hr tablet, Take 25 mg by mouth 2 (two) times daily. , Disp: , Rfl:  .  OXYGEN, Inhale 2 L into the lungs at bedtime., Disp: , Rfl:  .  sertraline (ZOLOFT) 100 MG tablet, Take 100 mg by mouth daily., Disp: , Rfl: 5 .  traZODone (DESYREL) 100 MG tablet, Take 100 mg by mouth at bedtime.  , Disp: , Rfl:  .  TRELEGY ELLIPTA 100-62.5-25 MCG/INH AEPB, INHALE 1 PUFF ONCE DAILY, Disp: , Rfl: 6  Past Medical History: Past Medical History:   Diagnosis Date  . BPH (benign prostatic hyperplasia)   . Coronary artery ectasia    Mild CAD with normal systolic function per cath in August of 2011  . Diaphragmatic paralysis    felt to be partially responsible for dyspnea  . Dyspnea    due to paralyzed diaphragm and pulmonary issues  . Gout   . HTN (hypertension)   . Hypercholesteremia   . Microhematuria   . Obesity   . OSA (obstructive sleep apnea)   . Paroxysmal atrial fibrillation (HCC)    occured in the setting of acute E Coli sepsis and ileius 7/12  . PVC (premature ventricular contraction)    s/p PVC ablation 05/29/2010    Tobacco Use: Social History   Tobacco Use  Smoking Status Former Smoker  . Packs/day: 3.00  . Years: 20.00  . Pack years: 60.00  . Types: Cigarettes  . Last attempt to quit: 09/09/1984  . Years since quitting: 33.6  Smokeless Tobacco Never Used    Labs: Recent Chemical engineer    Labs for ITP Cardiac and Pulmonary Rehab Latest Ref Rng & Units 07/03/2010 04/02/2011   PHART 7.350 - 7.450 7.414 -   PCO2ART 35.0 - 45.0 mmHg 37.5 -   HCO3 20.0 - 24.0 mEq/L  23.5 -   TCO2 0 - 100 mmol/L 20.4 23   ACIDBASEDEF 0.0 - 2.0 mmol/L 0.2 -   O2SAT % 95.1 -      Capillary Blood Glucose: Lab Results  Component Value Date   GLUCAP 117 (H) 04/03/2011     Pulmonary Assessment Scores: Pulmonary Assessment Scores    Row Name 02/06/18 0859 02/17/18 0919       ADL UCSD   ADL Phase  Entry  Entry    SOB Score total  -  70      CAT Score   CAT Score  -  21      mMRC Score   mMRC Score  3  -       Pulmonary Function Assessment:   Exercise Target Goals: Exercise Program Goal: Individual exercise prescription set using results from initial 6 min walk test and THRR while considering  patient's activity barriers and safety.   Exercise Prescription Goal: Initial exercise prescription builds to 30-45 minutes a day of aerobic activity, 2-3 days per week.  Home exercise guidelines will be given  to patient during program as part of exercise prescription that the participant will acknowledge.  Activity Barriers & Risk Stratification:   6 Minute Walk: 6 Minute Walk    Row Name 02/06/18 0855 02/06/18 0857       6 Minute Walk   Phase  Initial  (Pended)   Initial    Distance  1049 feet  (Pended)   1049 feet    Walk Time  6 minutes  (Pended)   6 minutes    # of Rest Breaks  0  (Pended)   0    MPH  1.98  (Pended)   1.98    METS  -  2.53    RPE  -  12    Perceived Dyspnea   -  1    Symptoms  -  Yes (comment)    Comments  -  used rollator-no other complaints-zoll showed trigeminy-cardiologist made aware-sent strips    Resting HR  -  75 bpm    Resting BP  -  126/64    Resting Oxygen Saturation   -  90 %    Exercise Oxygen Saturation  during 6 min walk  -  85 %    Max Ex. HR  -  106 bpm    Max Ex. BP  -  140/68      Interval HR   1 Minute HR  -  89    2 Minute HR  -  86    3 Minute HR  -  103    4 Minute HR  -  106    5 Minute HR  -  103    6 Minute HR  -  104    2 Minute Post HR  -  94    Interval Heart Rate?  -  Yes      Interval Oxygen   Interval Oxygen?  -  Yes    Baseline Oxygen Saturation %  -  90 %    1 Minute Oxygen Saturation %  -  89 %    1 Minute Liters of Oxygen  -  0 L    2 Minute Oxygen Saturation %  -  86 %    2 Minute Liters of Oxygen  -  0 L    3 Minute Oxygen Saturation %  -  85 %    3 Minute  Liters of Oxygen  -  0 L    4 Minute Oxygen Saturation %  -  89 %    4 Minute Liters of Oxygen  -  2 L    5 Minute Oxygen Saturation %  -  89 %    5 Minute Liters of Oxygen  -  2 L    6 Minute Oxygen Saturation %  -  90 %    6 Minute Liters of Oxygen  -  2 L    2 Minute Post Oxygen Saturation %  -  93 %    2 Minute Post Liters of Oxygen  -  2 L       Oxygen Initial Assessment: Oxygen Initial Assessment - 02/06/18 0854      Initial 6 min Walk   Oxygen Used  Continuous   Patient not originally on 02. Desaturated to 85% on room air during walk test. 2  liters used. Will cont. to observe patient during exercise for oxygen needs.   Liters per minute  2      Program Oxygen Prescription   Program Oxygen Prescription  Continuous;E-Tanks    Liters per minute  2       Oxygen Re-Evaluation: Oxygen Re-Evaluation    Row Name 02/11/18 1533 03/06/18 1040 03/30/18 0829 04/27/18 1355       Program Oxygen Prescription   Program Oxygen Prescription  Continuous;E-Tanks  Continuous;E-Tanks  Continuous;E-Tanks  Continuous;E-Tanks    Liters per minute  -  2  2  2       Home [place] Concentrator;E-Tanks Order just placed today for POC for exercise at home  Home Concentrator;E-Tanks    Sleep Oxygen Prescription  CPAP  CPAP  CPAP  CPAP    Home Exercise Oxygen Prescription  None  Continuous  Continuous  Continuous    Liters per minute  -  2  2  2     Home at Rest Exercise Oxygen Prescription  None  None  None  None    Compliance with Home Oxygen Use  Yes  Yes  Yes  Yes      Goals/Expected Outcomes   Short Term Goals  To learn and demonstrate proper use of respiratory medications;To learn and demonstrate proper pursed lip breathing techniques or other breathing techniques.;To learn and understand importance of monitoring SPO2 with pulse oximeter and demonstrate accurate use of the pulse oximeter.;To learn and understand importance of maintaining oxygen saturations>88%;To learn and exhibit compliance with exercise, home and travel O2 prescription  To learn and demonstrate proper use of respiratory medications;To learn and demonstrate proper pursed lip breathing techniques or other breathing techniques.;To learn and understand importance of monitoring SPO2 with pulse oximeter and demonstrate accurate use of the pulse oximeter.;To learn and understand importance of maintaining oxygen saturations>88%;To learn and exhibit compliance with exercise, home and travel O2 prescription  To learn and demonstrate  proper use of respiratory medications;To learn and demonstrate proper pursed lip breathing techniques or other breathing techniques.;To learn and understand importance of monitoring SPO2 with pulse oximeter and demonstrate accurate use of the pulse oximeter.;To learn and understand importance of maintaining oxygen saturations>88%;To learn and exhibit compliance with exercise, home and travel O2 prescription  To learn and demonstrate proper use of respiratory medications;To learn and demonstrate proper pursed lip breathing techniques or other breathing techniques.;To learn and understand importance of monitoring SPO2 with pulse oximeter and demonstrate accurate use of  the pulse oximeter.;To learn and understand importance of maintaining oxygen saturations>88%;To learn and exhibit compliance with exercise, home and travel O2 prescription    Long  Term Goals  Exhibits compliance with exercise, home and travel O2 prescription;Verbalizes importance of monitoring SPO2 with pulse oximeter and return demonstration;Maintenance of O2 saturations>88%;Exhibits proper breathing techniques, such as pursed lip breathing or other method taught during program session;Compliance with respiratory medication;Demonstrates proper use of MDI's  Exhibits compliance with exercise, home and travel O2 prescription;Verbalizes importance of monitoring SPO2 with pulse oximeter and return demonstration;Maintenance of O2 saturations>88%;Exhibits proper breathing techniques, such as pursed lip breathing or other method taught during program session;Compliance with respiratory medication;Demonstrates proper use of MDI's  Exhibits compliance with exercise, home and travel O2 prescription;Verbalizes importance of monitoring SPO2 with pulse oximeter and return demonstration;Maintenance of O2 saturations>88%;Exhibits proper breathing techniques, such as pursed lip breathing or other method taught during program session;Compliance with respiratory  medication;Demonstrates proper use of MDI's  Exhibits compliance with exercise, home and travel O2 prescription;Verbalizes importance of monitoring SPO2 with pulse oximeter and return demonstration;Maintenance of O2 saturations>88%;Exhibits proper breathing techniques, such as pursed lip breathing or other method taught during program session;Compliance with respiratory medication;Demonstrates proper use of MDI's    Goals/Expected Outcomes  -  Compliance and understanding of new home oxygen systems  Compliance and understanding of new home oxygen systems  Compliance and understanding of new home oxygen systems       Oxygen Discharge (Final Oxygen Re-Evaluation): Oxygen Re-Evaluation - 04/27/18 1355      Program Oxygen Prescription   Program Oxygen Prescription  Continuous;E-Tanks    Liters per minute  2      Home Oxygen   Home Oxygen Device  Home Concentrator;E-Tanks    Sleep Oxygen Prescription  CPAP    Home Exercise Oxygen Prescription  Continuous    Liters per minute  2    Home at Rest Exercise Oxygen Prescription  None    Compliance with Home Oxygen Use  Yes      Goals/Expected Outcomes   Short Term Goals  To learn and demonstrate proper use of respiratory medications;To learn and demonstrate proper pursed lip breathing techniques or other breathing techniques.;To learn and understand importance of monitoring SPO2 with pulse oximeter and demonstrate accurate use of the pulse oximeter.;To learn and understand importance of maintaining oxygen saturations>88%;To learn and exhibit compliance with exercise, home and travel O2 prescription    Long  Term Goals  Exhibits compliance with exercise, home and travel O2 prescription;Verbalizes importance of monitoring SPO2 with pulse oximeter and return demonstration;Maintenance of O2 saturations>88%;Exhibits proper breathing techniques, such as pursed lip breathing or other method taught during program session;Compliance with respiratory  medication;Demonstrates proper use of MDI's    Goals/Expected Outcomes  Compliance and understanding of new home oxygen systems       Initial Exercise Prescription: Initial Exercise Prescription - 02/12/18 1000      Date of Initial Exercise RX and Referring Provider   Date  02/05/18    Referring Provider  Dr. [name]      Oxygen   Oxygen  Continuous    Liters  2      NuStep   Level  2    SPM  80    Minutes  17    METs  1.5      Track   Laps  5    Minutes  17      Prescription Details   Frequency (times per week)  2    Duration  Progress to 45 minutes of aerobic exercise without signs/symptoms of physical distress      Intensity   THRR 40-80% of Max Heartrate  55-110    Ratings of Perceived Exertion  11-13    Perceived Dyspnea  0-4      Progression   Progression  Continue progressive overload as per policy without signs/symptoms or physical distress.      Resistance Training   Training Prescription  Yes    Weight  blue bands    Reps  10-15       Perform Capillary Blood Glucose checks as needed.  Exercise Prescription Changes:  Exercise Prescription Changes    Row Name 02/17/18 1200 02/26/18 1121 03/10/18 1200 03/17/18 1300 03/31/18 1200     Response to Exercise   Blood Pressure (Admit)  120/72  130/80  -  102/60  140/60   Blood Pressure (Exercise)  128/79  120/70  -  130/70  -   Blood Pressure (Exit)  122/66  138/64  -  126/58  100/56   Heart Rate (Admit)  65 bpm  63 bpm  -  60 bpm  58 bpm   Heart Rate (Exercise)  74 bpm  84 bpm  -  78 bpm  94 bpm   Heart Rate (Exit)  59 bpm  62 bpm  -  51 bpm  65 bpm   Oxygen Saturation (Admit)  93 %  91 %  -  96 %  95 %   Oxygen Saturation (Exercise)  92 %  92 %  -  94 %  93 %   Oxygen Saturation (Exit)  95 %  93 %  -  98 %  97 %   Rating of Perceived Exertion (Exercise)  13  11  -  11  12   Perceived Dyspnea (Exercise)  3  2  -  2  2   Duration  Progress to 45 minutes of aerobic exercise without signs/symptoms of  physical distress  Progress to 45 minutes of aerobic exercise without signs/symptoms of physical distress  -  Progress to 45 minutes of aerobic exercise without signs/symptoms of physical distress  Progress to 45 minutes of aerobic exercise without signs/symptoms of physical distress   Intensity  Other (comment) 40-80% HRR  THRR unchanged  -  THRR unchanged  THRR unchanged     Progression   Progression  Continue to progress workloads to maintain intensity without signs/symptoms of physical distress.  Continue to progress workloads to maintain intensity without signs/symptoms of physical distress.  -  Continue to progress workloads to maintain intensity without signs/symptoms of physical distress.  Continue to progress workloads to maintain intensity without signs/symptoms of physical distress.     Resistance Training   Training Prescription  Yes  Yes  -  Yes  Yes   Weight  - blue bands  blue bands  -  blue bands  blue bands   Reps  10-15  10-15  -  10-15  10-15   Time  10 Minutes  10 Minutes  -  10 Minutes  10 Minutes     Interval Training   Interval Training  No  No  -  No  No     Oxygen   Oxygen  -  -  -  Continuous  Continuous   Liters  2  2  -  2  2     NuStep   Level  3  3  -  4  5   SPM  80  80  -  80  80   Minutes  34  17  -  17  34   METs  1.7  2.2  -  1.8  2     Track   Laps  9  11  -  15  -   Minutes  17  17  -  17  -     Home Exercise Plan   Plans to continue exercise at  -  -  Home (comment)  -  -   Frequency  -  -  Add 2 additional days to program exercise sessions.  -  -   Row Name 04/14/18 1200 04/28/18 1200           Response to Exercise   Blood Pressure (Admit)  112/54  100/56      Blood Pressure (Exercise)  124/60  108/62      Blood Pressure (Exit)  106/56  100/56      Heart Rate (Admit)  55 bpm  58 bpm      Heart Rate (Exercise)  76 bpm  80 bpm      Heart Rate (Exit)  58 bpm  60 bpm      Oxygen Saturation (Admit)  97 %  94 %      Oxygen Saturation  (Exercise)  91 %  90 %      Oxygen Saturation (Exit)  98 %  92 %      Rating of Perceived Exertion (Exercise)  12  11      Perceived Dyspnea (Exercise)  2  2      Duration  Progress to 45 minutes of aerobic exercise without signs/symptoms of physical distress  Progress to 45 minutes of aerobic exercise without signs/symptoms of physical distress      Intensity  THRR unchanged  THRR unchanged        Progression   Progression  Continue to progress workloads to maintain intensity without signs/symptoms of physical distress.  Continue to progress workloads to maintain intensity without signs/symptoms of physical distress.        Resistance Training   Training Prescription  Yes  Yes      Weight  blue bands  blue bands      Reps  10-15  10-15      Time  10 Minutes  10 Minutes        Interval Training   Interval Training  No  No        Oxygen   Oxygen  Continuous  Continuous      Liters  2  2        NuStep   Level  3  6      SPM  80  80      Minutes  17  34      METs  1.9  2.1        Track   Laps  12  16      Minutes  17  17         Exercise Comments:  Exercise Comments    Row Name 03/10/18 1245           Exercise Comments  Home exercise changes          Exercise Goals and Review:  Exercise Goals    Row Name 01/28/18 1113             Exercise Goals  Increase Physical Activity  Yes       Intervention  Provide advice, education, support and counseling about physical activity/exercise needs.;Develop an individualized exercise prescription for aerobic and resistive training based on initial evaluation findings, risk stratification, comorbidities and participant's personal goals.       Expected Outcomes  Short Term: Attend rehab on a regular basis to increase amount of physical activity.;Long Term: Add in home exercise to make exercise part of routine and to increase amount of physical activity.;Long Term: Exercising regularly at least 3-5 days a week.       Increase  Strength and Stamina  Yes       Intervention  Provide advice, education, support and counseling about physical activity/exercise needs.;Develop an individualized exercise prescription for aerobic and resistive training based on initial evaluation findings, risk stratification, comorbidities and participant's personal goals.       Expected Outcomes  Short Term: Increase workloads from initial exercise prescription for resistance, speed, and METs.;Short Term: Perform resistance training exercises routinely during rehab and add in resistance training at home;Long Term: Improve cardiorespiratory fitness, muscular endurance and strength as measured by increased METs and functional capacity (6MWT)       Able to understand and use rate of perceived exertion (RPE) scale  Yes       Intervention  Provide education and explanation on how to use RPE scale       Expected Outcomes  Long Term:  Able to use RPE to guide intensity level when exercising independently;Short Term: Able to use RPE daily in rehab to express subjective intensity level       Able to understand and use Dyspnea scale  Yes       Intervention  Provide education and explanation on how to use Dyspnea scale       Expected Outcomes  Short Term: Able to use Dyspnea scale daily in rehab to express subjective sense of shortness of breath during exertion;Long Term: Able to use Dyspnea scale to guide intensity level when exercising independently       Knowledge and understanding of Target Heart Rate Range (THRR)  Yes       Intervention  Provide education and explanation of THRR including how the numbers were predicted and where they are located for reference       Expected Outcomes  Short Term: Able to state/look up THRR;Long Term: Able to use THRR to govern intensity when exercising independently;Short Term: Able to use daily as guideline for intensity in rehab       Understanding of Exercise Prescription  Yes       Intervention  Provide education,  explanation, and written materials on patient's individual exercise prescription       Expected Outcomes  Short Term: Able to explain program exercise prescription;Long Term: Able to explain home exercise prescription to exercise independently          Exercise Goals Re-Evaluation : Exercise Goals Re-Evaluation    Row Name 03/06/18 1041 03/30/18 0830 03/30/18 0854 04/27/18 1356       Exercise Goal Re-Evaluation   Exercise Goals Review  Increase Physical Activity;Able to understand and use rate of perceived exertion (RPE) scale;Knowledge and understanding of Target Heart Rate Range (THRR);Understanding of Exercise Prescription;Increase Strength and Stamina;Able to understand and use Dyspnea scale;Able to check pulse independently  Increase Physical Activity;Able to understand and use rate of perceived exertion (RPE) scale;Knowledge and understanding of Target Heart Rate Range (THRR);Understanding of Exercise Prescription;Increase Strength and Stamina;Able to understand  and use Dyspnea scale;Able to check pulse independently  (Pended)   Increase Physical Activity;Able to understand and use rate of perceived exertion (RPE) scale;Knowledge and understanding of Target Heart Rate Range (THRR);Understanding of Exercise Prescription;Increase Strength and Stamina;Able to understand and use Dyspnea scale;Able to check pulse independently  Increase Physical Activity;Able to understand and use rate of perceived exertion (RPE) scale;Knowledge and understanding of Target Heart Rate Range (THRR);Understanding of Exercise Prescription;Increase Strength and Stamina;Able to understand and use Dyspnea scale    Comments  Patient has attended 5 rehab sessions. Progress will be slow and probably not steady. Will cont. to monitor and progress as able. Has been out for 1 week on vacation.  -  Patient is progressing slowly in program. This is due to barriers of deconditoning and shortness of breath. Order placed for patient to  receive POC. Is able to walk 12 laps (200 ft each) in 15 minutes. MET level (1.9) places him at a low category. Will cont. to progress and motivate as possible.  Patient is progressing slowly in program. This is due to barriers of deconditoning and shortness of breath. Order placed for patient to receive POC. Is able to walk 13 laps (200 ft each) in 15 minutes. MET level (2.1) places him at a low category. Will cont. to progress and motivate as possible.    Expected Outcomes  Through exercise at rehab and at home, the patient will decrease shortness of breath with daily activities and feel confident in carrying out an exercise regime at home.   Through exercise at rehab and at home, the patient will decrease shortness of breath with daily activities and feel confident in carrying out an exercise regime at home.   (Pended)   Through exercise at rehab and at home, the patient will decrease shortness of breath with daily activities and feel confident in carrying out an exercise regime at home.   Through exercise at rehab and at home, the patient will decrease shortness of breath with daily activities and feel confident in carrying out an exercise regime at home.        Discharge Exercise Prescription (Final Exercise Prescription Changes): Exercise Prescription Changes - 04/28/18 1200      Response to Exercise   Blood Pressure (Admit)  100/56    Blood Pressure (Exercise)  108/62    Blood Pressure (Exit)  100/56    Heart Rate (Admit)  58 bpm    Heart Rate (Exercise)  80 bpm    Heart Rate (Exit)  60 bpm    Oxygen Saturation (Admit)  94 %    Oxygen Saturation (Exercise)  90 %    Oxygen Saturation (Exit)  92 %    Rating of Perceived Exertion (Exercise)  11    Perceived Dyspnea (Exercise)  2    Duration  Progress to 45 minutes of aerobic exercise without signs/symptoms of physical distress    Intensity  THRR unchanged      Progression   Progression  Continue to progress workloads to maintain intensity  without signs/symptoms of physical distress.      Resistance Training   Training Prescription  Yes    Weight  blue bands    Reps  10-15    Time  10 Minutes      Interval Training   Interval Training  No      Oxygen   Oxygen  Continuous    Liters  2      NuStep   Level  6  SPM  80    Minutes  34    METs  2.1      Track   Laps  16    Minutes  17       Nutrition:  Target Goals: Understanding of nutrition guidelines, daily intake of sodium <1552m, cholesterol <202m calories 30% from fat and 7% or less from saturated fats, daily to have 5 or more servings of fruits and vegetables.  Biometrics:    Nutrition Therapy Plan and Nutrition Goals: Nutrition Therapy & Goals - 03/04/18 1705      Nutrition Therapy   Diet  Heart Healthy      Personal Nutrition Goals   Nutrition Goal  Pt to identify and limit food sources of sodium.      Intervention Plan   Intervention  Prescribe, educate and counsel regarding individualized specific dietary modifications aiming towards targeted core components such as weight, hypertension, lipid management, diabetes, heart failure and other comorbidities.    Expected Outcomes  Short Term Goal: Understand basic principles of dietary content, such as calories, fat, sodium, cholesterol and nutrients.;Long Term Goal: Adherence to prescribed nutrition plan.       Nutrition Assessments: Nutrition Assessments - 02/05/18 1400      Rate Your Plate Scores   Pre Score  53       Nutrition Goals Re-Evaluation:   Nutrition Goals Discharge (Final Nutrition Goals Re-Evaluation):   Psychosocial: Target Goals: Acknowledge presence or absence of significant depression and/or stress, maximize coping skills, provide positive support system. Participant is able to verbalize types and ability to use techniques and skills needed for reducing stress and depression.  Initial Review & Psychosocial Screening: Initial Psych Review & Screening - 01/28/18  1026      Initial Review   Current issues with  Current Depression;History of Depression;Current Anxiety/Panic;Current Psychotropic Meds;Current Stress Concerns;Current Sleep Concerns    Source of Stress Concerns  Chronic Illness;Unable to participate in former interests or hobbies;Unable to perform yard/household activities    Comments  fearful of falling      [place] Yes      Barriers   Psychosocial barriers to participate in program  The patient should benefit from training in stress management and relaxation.      Screening Interventions   Interventions  Encouraged to exercise    Expected Outcomes  Short Term goal: Identification and review with participant of any Quality of Life or Depression concerns found by scoring the questionnaire.;Long Term Goal: Stressors or current issues are controlled or eliminated.       Quality of Life Scores:  Scores of 19 and below usually indicate a poorer quality of life in these areas.  A difference of  2-3 points is a clinically meaningful difference.  A difference of 2-3 points in the total score of the Quality of Life Index has been associated with significant improvement in overall quality of life, self-image, physical symptoms, and general health in studies assessing change in quality of life.  PHQ-9: Recent Review Flowsheet Data    [facility] 01/28/2018 05/31/2015   Decreased Interest 0 0   Down, Depressed, Hopeless 1 0   PHQ - 2 Score 1 0   Altered sleeping 2 -   Tired, decreased energy 1 -   Feeling bad or failure about yourself  1 -   Trouble concentrating 1 -   Moving slowly or fidgety/restless 0 -   Suicidal thoughts 0 -  PHQ-9 Score 6 -   Difficult doing work/chores Somewhat difficult -     Interpretation of Total Score  Total Score Depression Severity:  1-4 = Minimal depression, 5-9 = Mild depression, 10-14 = Moderate depression, 15-19 = Moderately severe depression, 20-27 =  Severe depression   Psychosocial Evaluation and Intervention: Psychosocial Evaluation - 04/28/18 1738      Psychosocial Evaluation & Interventions   Interventions  Stress management education;Relaxation education;Encouraged to exercise with the program and follow exercise prescription    Comments  Pt with anxiety regarding falling.  Pt reports no falls in the last 30 days.    Expected Outcomes  Pt will be free of any psychosocial needs    Continue Psychosocial Services   Follow up required by staff       Psychosocial Re-Evaluation: Psychosocial Re-Evaluation    [place] Name 03/09/18 1415 03/30/18 1516 04/28/18 1739         Psychosocial Re-Evaluation   Current issues with  Current Stress Concerns  -  None Identified     Comments  Going to see Truitt Merle July 3 to get results of holter monitor, this has been a stressor, when out of town last week and was told by MD it was ok to go  Pt met with Truitt Merle and received a good report.  Pt placed on Eliquis for his burst of afib.  Pt is grateful to the rehab staff for sending the monitor strips which then warranted holter montioring.  Pt with no identifiable barriers     Expected Outcomes  Continue to support re: fear of falling  Continue to support re: fear of falling  Pt will remain free from falls.  Pt will feel confident in his abilities in home and in the community.     Continue Psychosocial Services   No Follow up required  No Follow up required  Follow up required by staff     Comments  fearful of falling  fearful of falling  -       Initial Review   Source of Stress Concerns  Chronic Illness;Unable to participate in former interests or hobbies;Unable to perform yard/household activities  Chronic Illness;Unable to participate in former interests or hobbies;Unable to perform yard/household activities  -        Psychosocial Discharge (Final Psychosocial Re-Evaluation): Psychosocial Re-Evaluation - 04/28/18 1739      Psychosocial  Re-Evaluation   Current issues with  None Identified    Comments  Pt with no identifiable barriers    Expected Outcomes  Pt will remain free from falls.  Pt will feel confident in his abilities in home and in the community.    Continue Psychosocial Services   Follow up required by staff       Education: Education Goals: Education classes will be provided on a weekly basis, covering required topics. Participant will state understanding/return demonstration of topics presented.  Learning Barriers/Preferences: Learning Barriers/Preferences - 01/28/18 1029      Learning Barriers/Preferences   Learning Barriers  Sight    Learning Preferences  Computer/Internet;Verbal Instruction       Education Topics: Risk Factor Reduction:  -Group instruction that is supported by a PowerPoint presentation. Instructor discusses the definition of a risk factor, different risk factors for pulmonary disease, and how the heart and lungs work together.     Nutrition for Pulmonary Patient:  -Group instruction provided by PowerPoint slides, verbal discussion, and written materials to support subject matter. The instructor gives an explanation  and review of healthy diet recommendations, which includes a discussion on weight management, recommendations for fruit and vegetable consumption, as well as protein, fluid, caffeine, fiber, sodium, sugar, and alcohol. Tips for eating when patients are short of breath are discussed.   Pursed Lip Breathing:  -Group instruction that is supported by demonstration and informational handouts. Instructor discusses the benefits of pursed lip and diaphragmatic breathing and detailed demonstration on how to preform both.     Oxygen Safety:  -Group instruction provided by PowerPoint, verbal discussion, and written material to support subject matter. There is an overview of "What is Oxygen" and "Why do we need it".  Instructor also reviews how to create a safe environment for oxygen  use, the importance of using oxygen as prescribed, and the risks of noncompliance. There is a brief discussion on traveling with oxygen and resources the patient may utilize.   PULMONARY REHAB OTHER RESPIRATORY from 04/23/2018 in [place]  Date  04/09/18  Educator  Cloyde Reams  Instruction Review Code  1- Verbalizes Understanding      Oxygen Equipment:  -Group instruction provided by [employer] utilizing handouts, written materials, and Insurance underwriter.   PULMONARY REHAB OTHER RESPIRATORY from 04/23/2018 in [place]  Date  04/02/18  Educator  Ace Gins  Instruction Review Code  1- Verbalizes Understanding      Signs and Symptoms:  -Group instruction provided by written material and verbal discussion to support subject matter. Warning signs and symptoms of infection, stroke, and heart attack are reviewed and when to call the physician/911 reinforced. Tips for preventing the spread of infection discussed.   PULMONARY REHAB OTHER RESPIRATORY from 04/23/2018 in [place]  Date  03/19/18  Educator  Remo Lipps  Instruction Review Code  1- Verbalizes Understanding      Advanced Directives:  -Group instruction provided by verbal instruction and written material to support subject matter. Instructor reviews Advanced Directive laws and proper instruction for filling out document.   Pulmonary Video:  -Group video education that reviews the importance of medication and oxygen compliance, exercise, good nutrition, pulmonary hygiene, and pursed lip and diaphragmatic breathing for the pulmonary patient.   Exercise for the Pulmonary Patient:  -Group instruction that is supported by a PowerPoint presentation. Instructor discusses benefits of exercise, core components of exercise, frequency, duration, and intensity of an exercise routine, importance of utilizing pulse oximetry during exercise, safety  while exercising, and options of places to exercise outside of rehab.     Pulmonary Medications:  -Verbally interactive group education provided by instructor with focus on inhaled medications and proper administration.   PULMONARY REHAB OTHER RESPIRATORY from 04/23/2018 in [place]  Date  02/26/18  Educator  pharmacy  Instruction Review Code  1- Verbalizes Understanding      Anatomy and Physiology of the Respiratory System and Intimacy:  -Group instruction provided by PowerPoint, verbal discussion, and written material to support subject matter. Instructor reviews respiratory cycle and anatomical components of the respiratory system and their functions. Instructor also reviews differences in obstructive and restrictive respiratory diseases with examples of each. Intimacy, Sex, and Sexuality differences are reviewed with a discussion on how relationships can change when diagnosed with pulmonary disease. Common sexual concerns are reviewed.   MD DAY -A group question and answer session with a medical doctor that allows participants to ask questions that relate to their pulmonary disease state.   PULMONARY REHAB  OTHER RESPIRATORY from 04/23/2018 in [place]  Date  04/14/18  Educator  yacoub  Instruction Review Code  2- Demonstrated Understanding      OTHER EDUCATION -Group or individual verbal, written, or video instructions that support the educational goals of the pulmonary rehab program.   PULMONARY REHAB OTHER RESPIRATORY from 04/23/2018 in [place]  Date  02/19/18 [Beat a sedentary lifestyle]  Therapist, occupational  Instruction Review Code  1- Verbalizes Understanding      Holiday Eating Survival Tips:  -Group instruction provided by PowerPoint slides, verbal discussion, and written materials to support subject matter. The instructor gives patients tips, tricks, and techniques to help  them not only survive but enjoy the holidays despite the onslaught of food that accompanies the holidays.   Knowledge Questionnaire Score: Knowledge Questionnaire Score - 02/17/18 0918      Knowledge Questionnaire Score   Pre Score  14/18       Core Components/Risk Factors/Patient Goals at Admission: Personal Goals and Risk Factors at Admission - 01/28/18 1023      Core Components/Risk Factors/Patient Goals on Admission    Weight Management  Obesity;Weight Loss    Improve shortness of breath with ADL's  Yes    Intervention  Provide education, individualized exercise plan and daily activity instruction to help decrease symptoms of SOB with activities of daily living.    Expected Outcomes  Short Term: Improve cardiorespiratory fitness to achieve a reduction of symptoms when performing ADLs;Long Term: Be able to perform more ADLs without symptoms or delay the onset of symptoms    Hypertension  Yes    Intervention  Provide education on lifestyle modifcations including regular physical activity/exercise, weight management, moderate sodium restriction and increased consumption of fresh fruit, vegetables, and low fat dairy, alcohol moderation, and smoking cessation.;Monitor prescription use compliance.    Expected Outcomes  Short Term: Continued assessment and intervention until BP is < 140/51m HG in hypertensive participants. < 130/861mHG in hypertensive participants with diabetes, heart failure or chronic kidney disease.;Long Term: Maintenance of blood pressure at goal levels.    Lipids  Yes    Intervention  Provide education and support for participant on nutrition & aerobic/resistive exercise along with prescribed medications to achieve LDL <7065mHDL >70m11m  Expected Outcomes  Short Term: Participant states understanding of desired cholesterol values and is compliant with medications prescribed. Participant is following exercise prescription and nutrition guidelines.;Long Term: Cholesterol  controlled with medications as prescribed, with individualized exercise RX and with personalized nutrition plan. Value goals: LDL < 70mg68mL > 40 mg.    Stress  Yes    Intervention  Offer individual and/or small group education and counseling on adjustment to heart disease, stress management and health-related lifestyle change. Teach and support self-help strategies.;Refer participants experiencing significant psychosocial distress to appropriate mental health specialists for further evaluation and treatment. When possible, include family members and significant others in education/counseling sessions.    Expected Outcomes  Short Term: Participant demonstrates changes in health-related behavior, relaxation and other stress management skills, ability to obtain effective social support, and compliance with psychotropic medications if prescribed.;Long Term: Emotional wellbeing is indicated by absence of clinically significant psychosocial distress or social isolation.       Core Components/Risk Factors/Patient Goals Review:  Goals and Risk Factor Review    Row Name 02/11/18 1534 03/09/18 1412 03/30/18 1413 04/01/18 1155 04/28/18 1741     Core Components/Risk Factors/Patient Goals  Review   Personal Goals Review  Lipids;Hypertension;Stress;Weight Management/Obesity  Weight Management/Obesity;Improve shortness of breath with ADL's;Hypertension;Stress;Lipids  Weight Management/Obesity;Improve shortness of breath with ADL's;Hypertension;Stress;Lipids  -  Weight Management/Obesity;Improve shortness of breath with ADL's;Increase knowledge of respiratory medications and ability to use respiratory devices properly.;Develop more efficient breathing techniques such as purse lipped breathing and diaphragmatic breathing and practicing self-pacing with activity.   Review  Pt will begin pulmonary rehab on 02/12/18. Unable to assess pt progress at this time.  anticipate pt will make some progress toward his goals in the  next 30 days.  Attended 5 exercise sessions, on vacation last week.  No weight loss yet, hypertension, stress,  and lipids controlled, walking 8-16 laps on the track, level 3 on nustep, progressing well  [name] attended 10 exercise sessions.  Pt with weight loss of .1kg since the beginning of his participation on 6/6 Pt blood pressures have been within normal limits.  Pt with the addition of eliquis due to short runs afib. Will resolve hypertension as a patient goal.  stress,  Pt with good managment of his lipids through medication and diet. Per pt his cholesterol is good, there is no report in Epic to view.  During exercise pt is  walking 12-16 laps on the track,  increased to level 4 on nustep, progressing well will need to work on consistent home exercise.  [name] attended 19 exercise sessions.  Pt with weight loss of .6kg since the beginning of his participation on 6/6.  During exercise pt is  walking 13-18 laps on the track,  increased to level 6 on nustep, progressing well will need to work on consistent home exercise. I anticipate that pt will continue to demonstrate progress during the next 30 day assessment.   Expected Outcomes  see admission outcomes  see admission outcomes  see admission outcomes  see admission outcomes/goals  see admission outcomes/goals   Row Name 04/29/18 0953             Core Components/Risk Factors/Patient Goals Review   Expected Outcomes  See Admission Goals/Outcomes          Core Components/Risk Factors/Patient Goals at Discharge (Final Review):  Goals and Risk Factor Review - 04/29/18 0953      Core Components/Risk Factors/Patient Goals Review   Expected Outcomes  See Admission Goals/Outcomes       ITP Comments: ITP Comments    Row Name 01/28/18 1009 02/11/18 1533 03/30/18 1444 04/29/18 0952     ITP Comments  Dr. Jennet Maduro  Dr. Jennet Maduro  Dr. Jennet Maduro  Dr. Jennet Maduro, Medical Director       Comments:  Pt completed 19 exercise sessions.  Cherre Huger, BSN Cardiac and Training and development officer

## 2018-04-30 ENCOUNTER — Encounter (HOSPITAL_COMMUNITY)
Admission: RE | Admit: 2018-04-30 | Discharge: 2018-04-30 | Disposition: A | Discharge status: Home / Self Care | Payer: Medicare Other | Source: Ambulatory Visit | Attending: Pulmonary Disease | Admitting: Pulmonary Disease

## 2018-04-30 DIAGNOSIS — J432 Centrilobular emphysema: Secondary | ICD-10-CM | POA: Diagnosis not present

## 2018-04-30 NOTE — Progress Notes (Signed)
Daily Session Note  Patient Details  Name: [name] MRN: [id number] Date of Birth: 05-24-1934 Referring Provider:     PULMONARY REHAB OTHER RESPIRATORY from 02/12/2018 in [place]  Referring Provider  Dr. [name]      Encounter Date: 04/30/2018  Check In: Session Check In - 04/30/18 1227      Check-In   Supervising physician immediately available to respond to emergencies  [facility] immediately available    Physician(s)  Dr. Algis Liming    Location  [facility]    Staff Present  Su Hilt, MS, ACSM RCEP, Exercise Physiologist;Annedrea Rosezella [place], RN, MHA;Carlette Wilber Oliphant, RN, Roque Cash, RN    Medication changes reported      No    Fall or balance concerns reported     No    Tobacco Cessation  No Change    Warm-up and Cool-down  Performed as group-led instruction    Resistance Training Performed  Yes    VAD Patient?  No    PAD/SET Patient?  No      Pain Assessment   Currently in Pain?  No/denies    Multiple Pain Sites  No       Capillary Blood Glucose: No results found for this or any previous visit (from the past 24 hour(s)).    Social History   Tobacco Use  Smoking Status Former Smoker  . Packs/day: 3.00  . Years: 20.00  . Pack years: 60.00  . Types: Cigarettes  . Last attempt to quit: 09/09/1984  . Years since quitting: 33.6  Smokeless Tobacco Never Used    Goals Met:  Exercise tolerated well No report of cardiac concerns or symptoms Strength training completed today  Goals Unmet:  Not Applicable  Comments: Service time is from 1030 to 1200    Dr. Rush Farmer is Medical Director for Pulmonary Rehab at [facility].

## 2018-05-05 ENCOUNTER — Encounter (HOSPITAL_COMMUNITY)
Admission: RE | Admit: 2018-05-05 | Discharge: 2018-05-05 | Disposition: A | Discharge status: Home / Self Care | Payer: Medicare Other | Source: Ambulatory Visit | Attending: Pulmonary Disease | Admitting: Pulmonary Disease

## 2018-05-05 DIAGNOSIS — J432 Centrilobular emphysema: Secondary | ICD-10-CM | POA: Diagnosis not present

## 2018-05-05 NOTE — Progress Notes (Signed)
Daily Session Note  Patient Details  Name: [name] MRN: [id number] Date of Birth: 09/22/1933 Referring Provider:     PULMONARY REHAB OTHER RESPIRATORY from 02/12/2018 in [place]  Referring Provider  Dr. [name]      Encounter Date: 05/05/2018  Check In: Session Check In - 05/05/18 1155      Check-In   Supervising physician immediately available to respond to emergencies  [facility] immediately available    Physician(s)  Dr. Jonnie Finner     Location  [facility]    Staff Present  Su Hilt, MS, ACSM RCEP, Exercise Physiologist;Carlette Wilber Oliphant, RN, BSN;Glynnis Gavel Ysidro Evert, RN;Joann Rion, RN, [place], MS, ACSM CEP, Exercise Physiologist    Medication changes reported      No    Fall or balance concerns reported     No    Tobacco Cessation  No Change    Warm-up and Cool-down  Performed as group-led instruction    Resistance Training Performed  Yes    VAD Patient?  No    PAD/SET Patient?  No      Pain Assessment   Currently in Pain?  No/denies       Capillary Blood Glucose: No results found for this or any previous visit (from the past 24 hour(s)).    Social History   Tobacco Use  Smoking Status Former Smoker  . Packs/day: 3.00  . Years: 20.00  . Pack years: 60.00  . Types: Cigarettes  . Last attempt to quit: 09/09/1984  . Years since quitting: 33.6  Smokeless Tobacco Never Used    Goals Met:  Exercise tolerated well No report of cardiac concerns or symptoms Strength training completed today  Goals Unmet:  Not Applicable  Comments: Service time is from 1030 to 1215    Dr. Rush Farmer is Medical Director for Pulmonary Rehab at [facility].

## 2018-05-07 ENCOUNTER — Encounter (HOSPITAL_COMMUNITY)
Admission: RE | Admit: 2018-05-07 | Discharge: 2018-05-07 | Disposition: A | Discharge status: Home / Self Care | Payer: Medicare Other | Source: Ambulatory Visit | Attending: Pulmonary Disease | Admitting: Pulmonary Disease

## 2018-05-07 DIAGNOSIS — J432 Centrilobular emphysema: Secondary | ICD-10-CM

## 2018-05-07 NOTE — Progress Notes (Signed)
  Daily Session Note  Patient Details  Name: [name] MRN: [id number] Date of Birth: 1933-09-13 Referring Provider:     PULMONARY REHAB OTHER RESPIRATORY from 02/12/2018 in [place]  Referring Provider  Dr. [name]      Encounter Date: 05/07/2018  Check In: Session Check In - 05/07/18 1015      Check-In   Supervising physician immediately available to respond to emergencies  [facility] immediately available    Physician(s)  Dr. Florene Glen    Location  [facility]    Staff Present  Su Hilt, MS, ACSM RCEP, Exercise Physiologist;Carlette Wilber Oliphant, RN, Deland Pretty, MS, ACSM CEP, Exercise Physiologist    Medication changes reported      No    Fall or balance concerns reported     No    Tobacco Cessation  No Change    Warm-up and Cool-down  Performed as group-led instruction    Resistance Training Performed  Yes    VAD Patient?  No    PAD/SET Patient?  No      Pain Assessment   Currently in Pain?  No/denies    Multiple Pain Sites  No       Capillary Blood Glucose: No results found for this or any previous visit (from the past 24 hour(s)).    Social History   Tobacco Use  Smoking Status Former Smoker  . Packs/day: 3.00  . Years: 20.00  . Pack years: 60.00  . Types: Cigarettes  . Last attempt to quit: 09/09/1984  . Years since quitting: 33.6  Smokeless Tobacco Never Used    Goals Met:  Exercise tolerated well  Goals Unmet:  Not Applicable  Comments: Service time is from 10:30a to 12:45p    Dr. Rush Farmer is Medical Director for Pulmonary Rehab at [facility].

## 2018-05-12 ENCOUNTER — Encounter (HOSPITAL_COMMUNITY)
Admission: RE | Admit: 2018-05-12 | Discharge: 2018-05-12 | Disposition: A | Discharge status: Home / Self Care | Payer: Medicare Other | Source: Ambulatory Visit | Attending: Pulmonary Disease | Admitting: Pulmonary Disease

## 2018-05-12 DIAGNOSIS — J432 Centrilobular emphysema: Secondary | ICD-10-CM | POA: Diagnosis not present

## 2018-05-14 ENCOUNTER — Encounter (HOSPITAL_COMMUNITY)
Admission: RE | Admit: 2018-05-14 | Discharge: 2018-05-14 | Disposition: A | Discharge status: Home / Self Care | Payer: Medicare Other | Source: Ambulatory Visit | Attending: Pulmonary Disease | Admitting: Pulmonary Disease

## 2018-05-14 ENCOUNTER — Encounter (HOSPITAL_COMMUNITY)
Admission: RE | Admit: 2018-05-14 | Discharge: 2018-05-14 | Disposition: A | Discharge status: Home / Self Care | Payer: Medicare Other | Source: Ambulatory Visit

## 2018-05-14 DIAGNOSIS — J432 Centrilobular emphysema: Secondary | ICD-10-CM | POA: Diagnosis not present

## 2018-05-19 ENCOUNTER — Encounter (HOSPITAL_COMMUNITY): Payer: Medicare Other

## 2018-05-21 ENCOUNTER — Encounter (HOSPITAL_COMMUNITY): Payer: Medicare Other

## 2018-05-26 ENCOUNTER — Encounter (HOSPITAL_COMMUNITY): Payer: Medicare Other

## 2018-05-28 ENCOUNTER — Encounter (HOSPITAL_COMMUNITY): Payer: Medicare Other

## 2018-06-02 ENCOUNTER — Encounter (HOSPITAL_COMMUNITY): Payer: Medicare Other

## 2018-06-04 ENCOUNTER — Encounter (HOSPITAL_COMMUNITY): Payer: Medicare Other

## 2018-06-09 ENCOUNTER — Encounter (HOSPITAL_COMMUNITY): Payer: Medicare Other

## 2018-06-10 ENCOUNTER — Telehealth: Payer: Self-pay | Admitting: Pulmonary Disease

## 2018-06-10 DIAGNOSIS — J449 Chronic obstructive pulmonary disease, unspecified: Secondary | ICD-10-CM

## 2018-06-10 NOTE — Telephone Encounter (Signed)
Spoke with [name] with Pulmonary Rehab. She is requesting to have an order placed for the pulmonary rehab maintenance program.  BQ - please advise. Thanks.

## 2018-06-10 NOTE — Telephone Encounter (Signed)
OK by me 

## 2018-06-10 NOTE — Telephone Encounter (Signed)
Order placed for pulmonary rehab maintenance program

## 2018-06-11 ENCOUNTER — Other Ambulatory Visit: Payer: Self-pay | Admitting: Nurse Practitioner

## 2018-06-11 ENCOUNTER — Encounter (HOSPITAL_COMMUNITY): Payer: Medicare Other

## 2018-06-12 NOTE — Addendum Note (Signed)
Encounter addended by: Rowe Pavy, RN on: 06/12/2018 2:43 AM  Actions taken: Episode resolved, Flowsheet data copied forward, Visit Navigator Flowsheet section accepted, Sign clinical note

## 2018-06-12 NOTE — Progress Notes (Signed)
Discharge Progress Report  Patient Details  Name: [name] MRN: [id number] Date of Birth: [birth year] Referring Provider:     PULMONARY REHAB OTHER RESPIRATORY from 02/12/2018 in [place]  Referring Provider  Dr. [name]       Number of Visits: 23  Reason for Discharge:  Patient reached a stable level of exercise. Patient independent in their exercise. Patient has met program and personal goals.  Smoking History:  Social History   Tobacco Use  Smoking Status Former Smoker  . Packs/day: 3.00  . Years: 20.00  . Pack years: 60.00  . Types: Cigarettes  . Last attempt to quit: 09/09/1984  . Years since quitting: 33.7  Smokeless Tobacco Never Used    Diagnosis:  Centrilobular emphysema ([place])  ADL [facility]: Pulmonary Assessment Scores    Row Name 02/06/18 0859 02/17/18 0919 05/13/18 1419     ADL [facility]   ADL Phase  Entry  Entry  Exit   SOB Score total  -  70  65     CAT Score   CAT Score  -  21  14     mMRC Score   mMRC Score  3  -  -   Row Name 05/13/18 1521         ADL [facility]   ADL Phase  Exit       mMRC Score   mMRC Score  1        Initial Exercise Prescription: Initial Exercise Prescription - 02/12/18 1000      Date of Initial Exercise RX and Referring Provider   Date  02/05/18    Referring Provider  Dr. [name]      Oxygen   Oxygen  Continuous    Liters  2      NuStep   Level  2    SPM  80    Minutes  17    METs  1.5      Track   Laps  5    Minutes  17      Prescription Details   Frequency (times per week)  2    Duration  Progress to 45 minutes of aerobic exercise without signs/symptoms of physical distress      Intensity   THRR 40-80% of Max Heartrate  55-110    Ratings of Perceived Exertion  11-13    Perceived Dyspnea  0-4      Progression   Progression  Continue progressive overload as per policy without signs/symptoms or physical distress.      Resistance Training   Training Prescription  Yes     Weight  blue bands    Reps  10-15       Discharge Exercise Prescription (Final Exercise Prescription Changes): Exercise Prescription Changes - 04/28/18 1200      Response to Exercise   Blood Pressure (Admit)  100/56    Blood Pressure (Exercise)  108/62    Blood Pressure (Exit)  100/56    Heart Rate (Admit)  58 bpm    Heart Rate (Exercise)  80 bpm    Heart Rate (Exit)  60 bpm    Oxygen Saturation (Admit)  94 %    Oxygen Saturation (Exercise)  90 %    Oxygen Saturation (Exit)  92 %    Rating of Perceived Exertion (Exercise)  11    Perceived Dyspnea (Exercise)  2    Duration  Progress to 45 minutes of aerobic exercise without signs/symptoms of physical  distress    Intensity  THRR unchanged      Progression   Progression  Continue to progress workloads to maintain intensity without signs/symptoms of physical distress.      Resistance Training   Training Prescription  Yes    Weight  blue bands    Reps  10-15    Time  10 Minutes      Interval Training   Interval Training  No      Oxygen   Oxygen  Continuous    Liters  2      NuStep   Level  6    SPM  80    Minutes  34    METs  2.1      Track   Laps  16    Minutes  17       Functional Capacity: [place] Name 02/06/18 0855 02/06/18 0857 05/13/18 1518     6 Minute Walk   Phase  Initial  (Pended)   Initial  Discharge   Distance  1049 feet  (Pended)   1049 feet  1244 feet   Distance Feet Change  -  -  195 ft   Walk Time  6 minutes  (Pended)   6 minutes  6 minutes   # of Rest Breaks  0  (Pended)   0  0   MPH  1.98  (Pended)   1.98  2.35   METS  -  2.53  2.84   RPE  -  12  11   Perceived Dyspnea   -  1  2   Symptoms  -  Yes (comment)  Yes (comment)   Comments  -  used rollator-no other complaints-zoll showed trigeminy-cardiologist made aware-sent strips  used wheelchair   Resting HR  -  75 bpm  75 bpm   Resting BP  -  126/64  108/64   Resting Oxygen Saturation   -  90 %  90 %   Exercise Oxygen  Saturation  during 6 min walk  -  85 %  88 %   Max Ex. HR  -  106 bpm  90 bpm   Max Ex. BP  -  140/68  138/58     Interval HR   1 Minute HR  -  89  75   2 Minute HR  -  86  89   3 Minute HR  -  103  -   4 Minute HR  -  106  94   5 Minute HR  -  103  94   6 Minute HR  -  104  90   2 Minute Post HR  -  94  85   Interval Heart Rate?  -  Yes  Yes     Interval Oxygen   Interval Oxygen?  -  Yes  Yes   Baseline Oxygen Saturation %  -  90 %  90 %   1 Minute Oxygen Saturation %  -  89 %  88 %   1 Minute Liters of Oxygen  -  0 L  2 L   2 Minute Oxygen Saturation %  -  86 %  88 %   2 Minute Liters of Oxygen  -  0 L  3 L   3 Minute Oxygen Saturation %  -  85 %  - pulse ox would not read   3 Minute Liters of Oxygen  -  0 L  3 L   4 Minute Oxygen Saturation %  -  89 %  94 %   4 Minute Liters of Oxygen  -  2 L  3 L   5 Minute Oxygen Saturation %  -  89 %  94 %   5 Minute Liters of Oxygen  -  2 L  3 L   6 Minute Oxygen Saturation %  -  90 %  94 %   6 Minute Liters of Oxygen  -  2 L  3 L   2 Minute Post Oxygen Saturation %  -  93 %  95 %   2 Minute Post Liters of Oxygen  -  2 L  3 L      Psychological, QOL, Others - Outcomes: PHQ 2/9: Depression screen [place] 2/9 05/12/2018 01/28/2018 05/31/2015  Decreased Interest 0 0 0  Down, Depressed, Hopeless 0 1 0  PHQ - 2 Score 0 1 0  Altered sleeping 0 2 -  Tired, decreased energy 0 1 -  Feeling bad or failure about yourself  0 1 -  Trouble concentrating 0 1 -  Moving slowly or fidgety/restless 0 0 -  Suicidal thoughts 0 0 -  PHQ-9 Score 0 6 -  Difficult doing work/chores Not difficult at all Somewhat difficult -    Quality of Life:   Personal Goals: Goals established at orientation with interventions provided to work toward goal. Personal Goals and Risk Factors at Admission - 01/28/18 1023      Core Components/Risk Factors/Patient Goals on Admission    Weight Management  Obesity;Weight Loss    Improve shortness of breath with ADL's  Yes     Intervention  Provide education, individualized exercise plan and daily activity instruction to help decrease symptoms of SOB with activities of daily living.    Expected Outcomes  Short Term: Improve cardiorespiratory fitness to achieve a reduction of symptoms when performing ADLs;Long Term: Be able to perform more ADLs without symptoms or delay the onset of symptoms    Hypertension  Yes    Intervention  Provide education on lifestyle modifcations including regular physical activity/exercise, weight management, moderate sodium restriction and increased consumption of fresh fruit, vegetables, and low fat dairy, alcohol moderation, and smoking cessation.;Monitor prescription use compliance.    Expected Outcomes  Short Term: Continued assessment and intervention until BP is < 140/59m HG in hypertensive participants. < 130/883mHG in hypertensive participants with diabetes, heart failure or chronic kidney disease.;Long Term: Maintenance of blood pressure at goal levels.    Lipids  Yes    Intervention  Provide education and support for participant on nutrition & aerobic/resistive exercise along with prescribed medications to achieve LDL <7050mHDL >38m30m  Expected Outcomes  Short Term: Participant states understanding of desired cholesterol values and is compliant with medications prescribed. Participant is following exercise prescription and nutrition guidelines.;Long Term: Cholesterol controlled with medications as prescribed, with individualized exercise RX and with personalized nutrition plan. Value goals: LDL < 70mg4mL > 40 mg.    Stress  Yes    Intervention  Offer individual and/or small group education and counseling on adjustment to heart disease, stress management and health-related lifestyle change. Teach and support self-help strategies.;Refer participants experiencing significant psychosocial distress to appropriate mental health specialists for further evaluation and treatment. When  possible, include family members and significant others in education/counseling sessions.    Expected Outcomes  Short Term: Participant demonstrates changes in health-related behavior, relaxation  and other stress management skills, ability to obtain effective social support, and compliance with psychotropic medications if prescribed.;Long Term: Emotional wellbeing is indicated by absence of clinically significant psychosocial distress or social isolation.        Personal Goals Discharge: Goals and Risk Factor Review    Row Name 02/11/18 1534 03/09/18 1412 03/30/18 1413 04/01/18 1155 04/28/18 1741     Core Components/Risk Factors/Patient Goals Review   Personal Goals Review  Lipids;Hypertension;Stress;Weight Management/Obesity  Weight Management/Obesity;Improve shortness of breath with ADL's;Hypertension;Stress;Lipids  Weight Management/Obesity;Improve shortness of breath with ADL's;Hypertension;Stress;Lipids  -  Weight Management/Obesity;Improve shortness of breath with ADL's;Increase knowledge of respiratory medications and ability to use respiratory devices properly.;Develop more efficient breathing techniques such as purse lipped breathing and diaphragmatic breathing and practicing self-pacing with activity.   Review  Pt will begin pulmonary rehab on 02/12/18. Unable to assess pt progress at this time.  anticipate pt will make some progress toward his goals in the next 30 days.  Attended 5 exercise sessions, on vacation last week.  No weight loss yet, hypertension, stress,  and lipids controlled, walking 8-16 laps on the track, level 3 on nustep, progressing well  [name] attended 10 exercise sessions.  Pt with weight loss of .1kg since the beginning of his participation on 6/6 Pt blood pressures have been within normal limits.  Pt with the addition of eliquis due to short runs afib. Will resolve hypertension as a patient goal.  stress,  Pt with good managment of his lipids through medication and diet. Per  pt his cholesterol is good, there is no report in Epic to view.  During exercise pt is  walking 12-16 laps on the track,  increased to level 4 on nustep, progressing well will need to work on consistent home exercise.  [name] attended 19 exercise sessions.  Pt with weight loss of .6kg since the beginning of his participation on 6/6.  During exercise pt is  walking 13-18 laps on the track,  increased to level 6 on nustep, progressing well will need to work on consistent home exercise. I anticipate that pt will continue to demonstrate progress during the next 30 day assessment.   Expected Outcomes  see admission outcomes  see admission outcomes  see admission outcomes  see admission outcomes/goals  see admission outcomes/goals   Row Name 04/29/18 0953 06/12/18 0239           Core Components/Risk Factors/Patient Goals Review   Personal Goals Review  -  Weight Management/Obesity;Improve shortness of breath with ADL's;Increase knowledge of respiratory medications and ability to use respiratory devices properly.;Develop more efficient breathing techniques such as purse lipped breathing and diaphragmatic breathing and practicing self-pacing with activity.      Review  [employer] graduates with the completion on 23 exercise seession.  P      Expected Outcomes  See Admission Goals/Outcomes  Pt met his personal goals.  [name] was a delight person to know, he will be greatly missed by rehab staff         Exercise Goals and Review: Exercise Goals    Row Name 01/28/18 1113             Exercise Goals   Increase Physical Activity  Yes       Intervention  Provide advice, education, support and counseling about physical activity/exercise needs.;Develop an individualized exercise prescription for aerobic and resistive training based on initial evaluation findings, risk stratification, comorbidities and participant's personal goals.  Expected Outcomes  Short Term: Attend rehab on a regular basis to increase  amount of physical activity.;Long Term: Add in home exercise to make exercise part of routine and to increase amount of physical activity.;Long Term: Exercising regularly at least 3-5 days a week.       Increase Strength and Stamina  Yes       Intervention  Provide advice, education, support and counseling about physical activity/exercise needs.;Develop an individualized exercise prescription for aerobic and resistive training based on initial evaluation findings, risk stratification, comorbidities and participant's personal goals.       Expected Outcomes  Short Term: Increase workloads from initial exercise prescription for resistance, speed, and METs.;Short Term: Perform resistance training exercises routinely during rehab and add in resistance training at home;Long Term: Improve cardiorespiratory fitness, muscular endurance and strength as measured by increased METs and functional capacity (6MWT)       Able to understand and use rate of perceived exertion (RPE) scale  Yes       Intervention  Provide education and explanation on how to use RPE scale       Expected Outcomes  Long Term:  Able to use RPE to guide intensity level when exercising independently;Short Term: Able to use RPE daily in rehab to express subjective intensity level       Able to understand and use Dyspnea scale  Yes       Intervention  Provide education and explanation on how to use Dyspnea scale       Expected Outcomes  Short Term: Able to use Dyspnea scale daily in rehab to express subjective sense of shortness of breath during exertion;Long Term: Able to use Dyspnea scale to guide intensity level when exercising independently       Knowledge and understanding of Target Heart Rate Range (THRR)  Yes       Intervention  Provide education and explanation of THRR including how the numbers were predicted and where they are located for reference       Expected Outcomes  Short Term: Able to state/look up THRR;Long Term: Able to use THRR  to govern intensity when exercising independently;Short Term: Able to use daily as guideline for intensity in rehab       Understanding of Exercise Prescription  Yes       Intervention  Provide education, explanation, and written materials on patient's individual exercise prescription       Expected Outcomes  Short Term: Able to explain program exercise prescription;Long Term: Able to explain home exercise prescription to exercise independently          Nutrition & Weight - Outcomes:    Nutrition: Nutrition Therapy & Goals - 05/22/18 1054      Nutrition Therapy   Diet  Heart Healthy      Personal Nutrition Goals   Nutrition Goal  Pt to identify and limit food sources of sodium.      Intervention Plan   Intervention  Prescribe, educate and counsel regarding individualized specific dietary modifications aiming towards targeted core components such as weight, hypertension, lipid management, diabetes, heart failure and other comorbidities.    Expected Outcomes  Short Term Goal: Understand basic principles of dietary content, such as calories, fat, sodium, cholesterol and nutrients.;Long Term Goal: Adherence to prescribed nutrition plan.       Nutrition Discharge: Nutrition Assessments - 05/22/18 1055      Rate Your Plate Scores   Pre Score  53    Post Score  37       Education Questionnaire Score: Knowledge Questionnaire Score - 05/13/18 1419      Knowledge Questionnaire Score   Post Score  17/18       Goals reviewed with patient. Cherre Huger, BSN Cardiac and Training and development officer

## 2018-06-16 ENCOUNTER — Encounter (HOSPITAL_COMMUNITY): Payer: Medicare Other

## 2018-06-18 ENCOUNTER — Telehealth: Payer: Self-pay | Admitting: Nurse Practitioner

## 2018-06-18 ENCOUNTER — Encounter (HOSPITAL_COMMUNITY): Payer: Medicare Other

## 2018-06-18 NOTE — Telephone Encounter (Signed)
° °  Pt c/o medication issue:  1. Name of Medication:apixaban (ELIQUIS) 5 MG TABS tablet  2. How are you currently taking this medication (dosage and times per day)? Take 1 tablet (5 mg total) by mouth 2 (two) times daily  3. Are you having a reaction (difficulty breathing--STAT)? No   4. What is your medication issue? Patient states medication too costly.

## 2018-06-18 NOTE — Telephone Encounter (Signed)
Returned call to patient and he stated that he had already spoke with someone from the office today and was informed that samples along with a patient assistance application would be placed at the front desk.

## 2018-06-18 NOTE — Telephone Encounter (Signed)
Patient calling about eliquis and stating medication is too costly. Patient only has 3 tablets let. Put one box of samples and a patient assistance form at the front desk for patient to pick up. Asked patient to fill out form and return in to our pre-auth nurse. Patient verbalized understanding and plans to pick up today. Will forward to Pre-auth nurse to follow up.

## 2018-06-18 NOTE — Telephone Encounter (Signed)
° °  Patient calling the office for samples of medication:   1.  What medication and dosage are you requesting samples for? Eliquis  2.  Are you currently out of this medication? 3 remaining

## 2018-06-19 NOTE — Telephone Encounter (Addendum)
I called the pts pharmacy, [facility], and was advised that the pt is in the donut hole.  I called the pt and discussed with him filing out the [employer] pt asst application. The pt states that he will not qualify for pt asst.  He is requesting a lower costing medication than Eliquis.  He is aware that I am forwarding this message to Dr [name] and his nurse for advisement to the pt.

## 2018-06-19 NOTE — Telephone Encounter (Signed)
The lower option would be coumadin if he is interested but this will result in monitoring.

## 2018-06-21 NOTE — Telephone Encounter (Signed)
I agree with Cecille Rubin.  His only other option would be coumadin. I would prefer that he stay on DOAC therapy if able.

## 2018-06-22 MED ORDER — WARFARIN SODIUM 5 MG PO TABS: 5.0000 mg | ORAL_TABLET | Freq: Every day | ORAL | 0 refills | Status: DC

## 2018-06-22 NOTE — Addendum Note (Signed)
Addended by: Juanda Crumble R on: 06/22/2018 09:03 AM   Modules accepted: Orders

## 2018-06-22 NOTE — Telephone Encounter (Signed)
Noted  

## 2018-06-22 NOTE — Telephone Encounter (Signed)
S/w pt stated has to go on coumadin due to affordability. Pt understands has to come in to get coumadin checked on a regular basis. Also t/w Eino Farber, Pharm-D, stated pt is to take eliquis and coumadin (5 mg ) tablet together for 3 days than the fourth day pt only takes coumadin.  Pt will come in on Oct 21 for a new coumadin appt.which would be the 7th day and coumadin stated pt has to come in between the 5th and 7th day.   S/w pt advised of these instructions and agreeable.  Coumadin sent in and pt will start today. Appt confirmed. Will send to [facility] to [place]

## 2018-06-23 ENCOUNTER — Encounter (HOSPITAL_COMMUNITY): Payer: Medicare Other

## 2018-06-25 ENCOUNTER — Telehealth: Payer: Self-pay | Admitting: Pulmonary Disease

## 2018-06-25 ENCOUNTER — Encounter (HOSPITAL_COMMUNITY): Payer: Medicare Other

## 2018-06-25 NOTE — Telephone Encounter (Signed)
lmtcb for pt.  

## 2018-06-26 NOTE — Telephone Encounter (Signed)
Called and spoke with pt stating to him we had received a call from [place] with pulm rehab stating that he was wanting a POC.  Pt stated that that was correct. I asked pt if he knew what pulse flow he needed to be on and pt stated he thought it was 3pulse. I stated to pt that I would call Lattie Haw at pulm rehab to see if I can get all the necessary information so we could get this taken care of for him.  Called [facility] and spoke with [facility] who stated she thought the pulse flow was 3pulse but stated it has been more than 30 days that pt was at pulm rehab to do the walk as pt had graduated out of it.  Due to this, we will have to get pt scheduled for a qualifying walk for POC.  Called pt to let him know that we were going to need to get him scheduled for a visit to get everything taken care of for the POC.  Due to pt needing to have a follow up office visit soon, since pt was needing to be qualified for the [place], went ahead and scheduled pt an OV with Lazaro Arms, NP Monday, 10/21 at 2:30. Nothing further needed.

## 2018-06-29 ENCOUNTER — Ambulatory Visit: Payer: Medicare Other | Admitting: Nurse Practitioner

## 2018-06-29 ENCOUNTER — Encounter: Payer: Self-pay | Admitting: Nurse Practitioner

## 2018-06-29 ENCOUNTER — Ambulatory Visit: Payer: Medicare Other | Admitting: Pharmacist

## 2018-06-29 ENCOUNTER — Ambulatory Visit: Payer: Medicare Other

## 2018-06-29 VITALS — BP 118/64 | HR 72 | Ht 70.0 in | Wt 215.2 lb

## 2018-06-29 DIAGNOSIS — J449 Chronic obstructive pulmonary disease, unspecified: Secondary | ICD-10-CM | POA: Diagnosis not present

## 2018-06-29 DIAGNOSIS — J9611 Chronic respiratory failure with hypoxia: Secondary | ICD-10-CM | POA: Diagnosis not present

## 2018-06-29 DIAGNOSIS — I4891 Unspecified atrial fibrillation: Secondary | ICD-10-CM

## 2018-06-29 DIAGNOSIS — Z5181 Encounter for therapeutic drug level monitoring: Secondary | ICD-10-CM

## 2018-06-29 DIAGNOSIS — Z23 Encounter for immunization: Secondary | ICD-10-CM

## 2018-06-29 DIAGNOSIS — J432 Centrilobular emphysema: Secondary | ICD-10-CM | POA: Diagnosis not present

## 2018-06-29 LAB — POCT INR: INR: 2.2 (ref 2.0–3.0)

## 2018-06-29 NOTE — Progress Notes (Signed)
@Patient  ID: [name], male    DOB: 08-24-1934, 84 y.o.   MRN: [id number]  Chief Complaint  Patient presents with  . Follow-up    Referring provider: [name], MD    HPI  84 year old male former smoker with diaphragm weakness as well as COPD, OSA followed by Dr. [name].   Tests: Spirometry 07/23/2017>>Very severe restriction.  PFT Results Latest Ref Rng & Units 08/25/2014  FVC-Pre L 1.68  FVC-Predicted Pre % 41  FVC-Post L 1.72  FVC-Predicted Post % 42  Pre FEV1/FVC % % 55  Post FEV1/FCV % % 61  FEV1-Pre L 0.92  FEV1-Predicted Pre % 31  DLCO UNC% % 16  DLCO COR %Predicted % 56  TLC L 4.51  TLC % Predicted % 63  RV % Predicted % 105   OV 06/30/18 - walk for POC Patient presents today for a qualifying walk for POC. He states that overall he is doing well. He checks his O2 level at home and states that his oxygen level drops with exertion. He currently uses 2L [place] O2 at night . Also had a call from pulmonary rehab that patient's sats had dropped to 88% with exertion. He denies any chest pain, fever, or edema.   Note: When walked in office today patient's sats dropped to 86% on RA. His sats returned to 99% on RA while at rest.     No Known Allergies  Immunization History  Administered Date(s) Administered  . Influenza Whole 05/10/2010  . Influenza, High Dose Seasonal PF 07/04/2016, 06/29/2018  . Influenza,inj,Quad PF,6+ Mos 07/06/2015, 05/05/2017  . Influenza-Unspecified 07/17/2014, 06/06/2015  . Pneumococcal Polysaccharide-23 05/10/2009    Past Medical History:  Diagnosis Date  . BPH (benign prostatic hyperplasia)   . Coronary artery ectasia    Mild CAD with normal systolic function per cath in August of 2011  . Diaphragmatic paralysis    felt to be partially responsible for dyspnea  . Dyspnea    due to paralyzed diaphragm and pulmonary issues  . Gout   . HTN (hypertension)   . Hypercholesteremia   . Microhematuria   . Obesity   . OSA  (obstructive sleep apnea)   . Paroxysmal atrial fibrillation (HCC)    occured in the setting of acute E Coli sepsis and ileius 7/12  . PVC (premature ventricular contraction)    s/p PVC ablation 05/29/2010    Tobacco History: Social History   Tobacco Use  Smoking Status Former Smoker  . Packs/day: 3.00  . Years: 20.00  . Pack years: 60.00  . Types: Cigarettes  . Last attempt to quit: 09/09/1984  . Years since quitting: 33.8  Smokeless Tobacco Never Used   Counseling given: Yes   Outpatient Encounter Medications as of 06/29/2018  Medication Sig  . allopurinol (ZYLOPRIM) 300 MG tablet Take 300 mg by mouth daily.    Marland Kitchen amLODipine (NORVASC) 5 MG tablet Take 5 mg by mouth daily.  Marland Kitchen buPROPion (WELLBUTRIN SR) 100 MG 12 hr tablet Take 100 mg by mouth 2 (two) times daily.  Marland Kitchen dicyclomine (BENTYL) 20 MG tablet Take 20 mg by mouth 2 (two) times daily.   Marland Kitchen doxazosin (CARDURA) 4 MG tablet Take 1 tablet (4 mg total) by mouth daily.  . hydrALAZINE (APRESOLINE) 25 MG tablet TAKE 1 TABLET BY MOUTH TWICE A DAY  . lisinopril (PRINIVIL,ZESTRIL) 40 MG tablet Take 40 mg by mouth daily.   Marland Kitchen lovastatin (MEVACOR) 40 MG tablet Take 40 mg by mouth at  bedtime.    . metoprolol succinate (TOPROL-XL) 25 MG 24 hr tablet Take 25 mg by mouth 2 (two) times daily.   . OXYGEN Inhale 2 L into the lungs at bedtime.  . sertraline (ZOLOFT) 100 MG tablet Take 100 mg by mouth daily.  . traZODone (DESYREL) 100 MG tablet Take 100 mg by mouth at bedtime.    . TRELEGY ELLIPTA 100-62.5-25 MCG/INH AEPB INHALE 1 PUFF ONCE DAILY  . warfarin (COUMADIN) 5 MG tablet Take 1 tablet (5 mg total) by mouth daily. Take with eliquis for three days than continue daily.   No facility-administered encounter medications on file as of 06/29/2018.      Review of Systems  Review of Systems  Constitutional: Negative.  Negative for chills and fever.  HENT: Negative.   Respiratory: Negative for cough, shortness of breath and wheezing.     Cardiovascular: Negative.  Negative for chest pain, palpitations and leg swelling.  Gastrointestinal: Negative.   Allergic/Immunologic: Negative.   Neurological: Negative.   Psychiatric/Behavioral: Negative.        Physical Exam  BP 118/64 (BP Location: Left Arm, Patient Position: Sitting, Cuff Size: Normal)   Pulse 72   Ht 5\' 10"  (1.778 m)   Wt 215 lb 3.2 oz (97.6 kg)   SpO2 92% Comment: on room air  BMI 30.88 kg/m   Wt Readings from Last 5 Encounters:  06/29/18 215 lb 3.2 oz (97.6 kg)  04/15/18 215 lb 6.4 oz (97.7 kg)  04/14/18 216 lb 0.8 oz (98 kg)  03/31/18 216 lb 7.9 oz (98.2 kg)  03/26/18 218 lb 7.6 oz (99.1 kg)     Physical Exam  Constitutional: He is oriented to person, place, and time. He appears well-developed and well-nourished. No distress.  Cardiovascular: Normal rate and regular rhythm.  Pulmonary/Chest: Effort normal and breath sounds normal. No respiratory distress. He has no wheezes. He has no rales.  Musculoskeletal: He exhibits no edema.  Neurological: He is alert and oriented to person, place, and time.  Skin: Skin is warm and dry.  Psychiatric: He has a normal mood and affect.  Nursing note and vitals reviewed.      Assessment & Plan:  Chronic respiratory failure with hypoxia ([place]) - Plan: Ambulatory Referral for DME, CANCELED: Ambulatory Referral for DME  Encounter for immunization - Plan: Flu vaccine HIGH DOSE PF  Chronic obstructive pulmonary disease, unspecified COPD type ([place]) - Plan: Ambulatory Referral for DME, CANCELED: Ambulatory Referral for DME  Centrilobular emphysema ([place])   Chronic respiratory failure with hypoxia ([place]) Patient Instructions  Continue 2L O2 at night Use 2-3L O2 during the day with exertion also to keep O2 sats at 88% or higher  Continue current medications Follow up in 6 months with Dr. [name] or sooner if needed  Note: When walked in office today patient's sats dropped to 86% on RA. His sats returned to  99% on RA while at rest.    COPD (chronic obstructive pulmonary disease) with emphysema ([place]) Patient Instructions  Continue 2L O2 at night Use 2-3L O2 during the day with exertion also to keep O2 sats at 88% or higher  Continue current medications Follow up in 6 months with Dr. [name] or sooner if needed       Fenton Foy, NP 06/30/2018

## 2018-06-29 NOTE — Patient Instructions (Signed)
Description   Continue to take your warfarin 5mg  daily (1 tablet). Call coumadin clinic with any questions [phone]  Recheck in 1 week.

## 2018-06-29 NOTE — Patient Instructions (Addendum)
Continue 2L O2 at night Use 2-3L O2 during the day with exertion also to keep O2 sats at 88% or higher  Continue current medications Follow up in 6 months with Dr. [name] or sooner if needed

## 2018-06-30 ENCOUNTER — Encounter: Payer: Self-pay | Admitting: Nurse Practitioner

## 2018-06-30 ENCOUNTER — Encounter (HOSPITAL_COMMUNITY): Payer: Medicare Other

## 2018-06-30 DIAGNOSIS — J9611 Chronic respiratory failure with hypoxia: Secondary | ICD-10-CM | POA: Insufficient documentation

## 2018-06-30 NOTE — Progress Notes (Signed)
Reviewed, agree 

## 2018-06-30 NOTE — Assessment & Plan Note (Signed)
Patient Instructions  Continue 2L O2 at night Use 2-3L O2 during the day with exertion also to keep O2 sats at 88% or higher  Continue current medications Follow up in 6 months with Dr. [name] or sooner if needed  Note: When walked in office today patient's sats dropped to 86% on RA. His sats returned to 99% on RA while at rest.

## 2018-06-30 NOTE — Assessment & Plan Note (Signed)
Patient Instructions  Continue 2L O2 at night Use 2-3L O2 during the day with exertion also to keep O2 sats at 88% or higher  Continue current medications Follow up in 6 months with Dr. [name] or sooner if needed

## 2018-07-02 ENCOUNTER — Encounter (HOSPITAL_COMMUNITY): Payer: Medicare Other

## 2018-07-06 ENCOUNTER — Ambulatory Visit: Payer: Medicare Other

## 2018-07-06 DIAGNOSIS — I4891 Unspecified atrial fibrillation: Secondary | ICD-10-CM

## 2018-07-06 DIAGNOSIS — Z5181 Encounter for therapeutic drug level monitoring: Secondary | ICD-10-CM | POA: Diagnosis not present

## 2018-07-06 LAB — POCT INR: INR: 5 — AB (ref 2.0–3.0)

## 2018-07-06 NOTE — Patient Instructions (Signed)
Description   Skip 2 dosages of Coumadin, then start taking 1 tablet daily except 1/2 tablet on Tuesdays, Thursdays, and Saturdays. Call coumadin clinic with any questions [phone].  Recheck INR in 1 week.

## 2018-07-07 ENCOUNTER — Telehealth: Payer: Self-pay | Admitting: Nurse Practitioner

## 2018-07-07 DIAGNOSIS — J9611 Chronic respiratory failure with hypoxia: Secondary | ICD-10-CM

## 2018-07-07 DIAGNOSIS — J432 Centrilobular emphysema: Secondary | ICD-10-CM

## 2018-07-07 NOTE — Telephone Encounter (Signed)
I called Lattie Haw but there was no answer. LM for Lattie Haw to call back. In the meanwhile is it ok to send a POC order to [facility]? Please advise.

## 2018-07-07 NOTE — Telephone Encounter (Signed)
Lisa's call back at [facility] is [phone].

## 2018-07-08 NOTE — Telephone Encounter (Signed)
Lattie Haw from [facility] is calling back [phone]

## 2018-07-08 NOTE — Telephone Encounter (Signed)
LMTCB again for [facility]

## 2018-07-09 NOTE — Telephone Encounter (Signed)
Spoke with pt, he states he would like a POC but [facility] doesn't have any. He would like [place] to send an order to another DME company so he can receive one. Awaiting approval from [facility] or Dr. [name].

## 2018-07-10 NOTE — Telephone Encounter (Signed)
Called and spoke with pt letting him know that we were going to switch DME's for him to be able to receive a POC. Pt expressed understanding. Pt asked what would need to be done with the O2 supplies he currently had at home from [facility] as pt wears 2L O2 at night.  I stated to pt [facility] would come pick up their equipment and the new DME would bring their equipment to him. Pt expressed understanding. Order has been placed for [facility] to D/C pt's O2 and also have placed in order for pt to have DME changed so he can be able to receive a POC

## 2018-07-10 NOTE — Telephone Encounter (Signed)
OK by me to send to another company.  [employer] or [employer]

## 2018-07-13 ENCOUNTER — Ambulatory Visit: Payer: Medicare Other

## 2018-07-13 DIAGNOSIS — Z5181 Encounter for therapeutic drug level monitoring: Secondary | ICD-10-CM

## 2018-07-13 DIAGNOSIS — I4891 Unspecified atrial fibrillation: Secondary | ICD-10-CM | POA: Diagnosis not present

## 2018-07-13 LAB — POCT INR: INR: 1.5 — AB (ref 2.0–3.0)

## 2018-07-13 NOTE — Patient Instructions (Signed)
Description   Take 1.5 tablets today, then resume same dosage 1 tablet daily except 1/2 tablet on Tuesdays, Thursdays, and Saturdays. Call coumadin clinic with any questions [phone].  Recheck INR in 1 week.

## 2018-07-14 ENCOUNTER — Other Ambulatory Visit: Payer: Self-pay | Admitting: Nurse Practitioner

## 2018-07-14 ENCOUNTER — Encounter (HOSPITAL_COMMUNITY)
Admission: RE | Admit: 2018-07-14 | Discharge: 2018-07-14 | Disposition: A | Discharge status: Home / Self Care | Payer: Medicare Other | Source: Ambulatory Visit | Attending: Pulmonary Disease | Admitting: Pulmonary Disease

## 2018-07-14 DIAGNOSIS — J432 Centrilobular emphysema: Secondary | ICD-10-CM | POA: Insufficient documentation

## 2018-07-14 NOTE — Progress Notes (Signed)
[name] started the Pulmonary Maintenance program today. He used his pulsed O2 at 2 liters when he started exercising. It took pulsed oxygen 3 liters to keep his satuations greater than 88%. He tolerated exercise well other than c/o of SOB.

## 2018-07-16 ENCOUNTER — Encounter (HOSPITAL_COMMUNITY)
Admission: RE | Admit: 2018-07-16 | Discharge: 2018-07-16 | Disposition: A | Discharge status: Home / Self Care | Payer: Medicare Other | Source: Ambulatory Visit | Attending: Pulmonary Disease | Admitting: Pulmonary Disease

## 2018-07-20 ENCOUNTER — Ambulatory Visit: Payer: Medicare Other | Admitting: Pharmacist

## 2018-07-20 DIAGNOSIS — Z5181 Encounter for therapeutic drug level monitoring: Secondary | ICD-10-CM | POA: Diagnosis not present

## 2018-07-20 DIAGNOSIS — I4891 Unspecified atrial fibrillation: Secondary | ICD-10-CM | POA: Diagnosis not present

## 2018-07-20 LAB — POCT INR: INR: 1.7 — AB (ref 2.0–3.0)

## 2018-07-20 NOTE — Patient Instructions (Signed)
Description   Take 1.5 tablets today, then start taking 1 tablet daily except 1/2 tablet on Tuesdays and Saturdays. Call Coumadin clinic with any questions [phone].  Recheck INR in 1 week.

## 2018-07-21 ENCOUNTER — Encounter (HOSPITAL_COMMUNITY)
Admission: RE | Admit: 2018-07-21 | Discharge: 2018-07-21 | Disposition: A | Discharge status: Home / Self Care | Payer: Medicare Other | Source: Ambulatory Visit | Attending: Pulmonary Disease | Admitting: Pulmonary Disease

## 2018-07-22 ENCOUNTER — Telehealth: Payer: Self-pay

## 2018-07-22 ENCOUNTER — Encounter: Payer: Self-pay | Admitting: Nurse Practitioner

## 2018-07-22 ENCOUNTER — Ambulatory Visit: Payer: Medicare Other | Admitting: Nurse Practitioner

## 2018-07-22 VITALS — BP 140/70 | HR 58 | Ht 70.0 in | Wt 213.0 lb

## 2018-07-22 DIAGNOSIS — I493 Ventricular premature depolarization: Secondary | ICD-10-CM

## 2018-07-22 DIAGNOSIS — Z79899 Other long term (current) drug therapy: Secondary | ICD-10-CM | POA: Diagnosis not present

## 2018-07-22 DIAGNOSIS — I480 Paroxysmal atrial fibrillation: Secondary | ICD-10-CM

## 2018-07-22 DIAGNOSIS — I259 Chronic ischemic heart disease, unspecified: Secondary | ICD-10-CM | POA: Diagnosis not present

## 2018-07-22 DIAGNOSIS — I10 Essential (primary) hypertension: Secondary | ICD-10-CM

## 2018-07-22 MED ORDER — APIXABAN 5 MG PO TABS: 5.0000 mg | ORAL_TABLET | Freq: Two times a day (BID) | ORAL | 6 refills | Status: DC

## 2018-07-22 NOTE — Patient Instructions (Addendum)
We will be checking the following labs today - NONE   Medication Instructions:    Continue with your current medicines. BUT  STOP COUMADIN AS OF TOMORROW (Thursday) START ELIQUIS 5 mg TWICE A DAY STARTING Friday - I have sent this back to the drug store.    If you need a refill on your cardiac medications before your next appointment, please call your pharmacy.     Testing/Procedures To Be Arranged:  N/A  Follow-Up:   See me in about 4 months    At [facility], you and your health needs are our priority.  As part of our continuing mission to provide you with exceptional heart care, we have created designated Provider Care Teams.  These Care Teams include your primary Cardiologist (physician) and Advanced Practice Providers (APPs -  Physician Assistants and Nurse Practitioners) who all work together to provide you with the care you need, when you need it.  Special Instructions:  . Look for your drug formulary with [facility] and see what options are for "anticoagulants".   Call the [place] office at [phone] if you have any questions, problems or concerns.

## 2018-07-22 NOTE — Telephone Encounter (Signed)
I called the pts pharmacy ([facility]) and was advised that the pt may be in the donut hole because the cost of his Eliquis is $128.29 for a 30 day supply.  I called the pt and discussed the [employer] pt asst with him as I cannot get the cost of his Eliquis lowered through his insurance if he is in the donut hole.  He stated that he is not interested in applying because the office is giving him boxes of Eliquis samples.  I explained to him that we did give him a months worth of Eliquis while at his OV today but that we will not be able to give him his Eliquis monthly and that at this time all I can offer is pt assistance.  He asked me how much money would be too much for him to earn yearly in order for him to be eligible for pt asst. I advised him that it is 4% of his yearly income.  He requested that I mail him a BMS pt asst application and stated that he will call his insurance company to see if he is truly in the donut hole and to ask them which anticoagulant is on their preferred list of covered meds.

## 2018-07-22 NOTE — Telephone Encounter (Signed)
-----   Message from Burtis Junes, NP sent at 07/22/2018  9:24 AM EST ----- Jeani Hawking,   Can you see if there are any options for this patient's Eliquis to help with the cost?  Thanks so much  lori

## 2018-07-22 NOTE — Progress Notes (Signed)
CARDIOLOGY OFFICE NOTE  Date:  07/22/2018    [name] Date of Birth: February 20, 1934 Medical Record #[id number]  PCP:  [name], MD  Cardiologist:  [name]    Chief Complaint  Patient presents with  . Atrial Fibrillation  . Shortness of Breath    Follow up visit - seen for Dr. [name]    History of Present Illness: [name] is a 84 y.o. male who presents today for a follow up visit. Seen for Dr. [name]. He has seen Dr. [name] in the remote past. Primarily follows with me.   He has a h/o PVC ablation 05/29/10, PAF, obesity, sedentary lifestyle, HLD, gout, OSA treated with CPAP, &dysfunctional diaphragm with chronic dyspneapreviouslyfollowed by Dr. [name]. He has mild CAD per cath back in August of 2011. Labs are checked by primary care.   Seen back in February of 2016 by Dr. [name] - this was for evaluation of dyspnea. Dr. [name] wanted cardiac status reevaluated to assess for any cardiac issues that may be contributing to dyspnea. Was referred for Myoview and echocardiogram. These studies were satisfactory. Encouraged to cut back on his alcohol intake as well.   I have followed him since - he has been holding his own - dyspnea is chronic.He has gotten back with pulmonary - now seeing Dr. [name].Balance issues and has had some falls. Has ended up having documented AF - now on anticoagulation - initially with Eliquis - but then transitioned to Coumadin due to cost. Has also needed some regulation of his BP medicines. Last seen in August and he was doing well.   Comes back today. Here alone.He is doing ok. No chest pain. Breathing is stable. He continues to do pulmonary rehab. Has lost a few more pounds. His only issue is the Coumadin - INRs have been quite labile - from 5.0 to 1.5. He wishes to go back to Eliquis. BP is ok. He has no other concerns.   Past Medical History:  Diagnosis Date  . BPH (benign prostatic hyperplasia)   . Coronary  artery ectasia    Mild CAD with normal systolic function per cath in August of 2011  . Diaphragmatic paralysis    felt to be partially responsible for dyspnea  . Dyspnea    due to paralyzed diaphragm and pulmonary issues  . Gout   . HTN (hypertension)   . Hypercholesteremia   . Microhematuria   . Obesity   . OSA (obstructive sleep apnea)   . Paroxysmal atrial fibrillation (HCC)    occured in the setting of acute E Coli sepsis and ileius 7/12  . PVC (premature ventricular contraction)    s/p PVC ablation 05/29/2010    Past Surgical History:  Procedure Laterality Date  . APPENDECTOMY  1978  . CARDIAC CATHETERIZATION  04-30-2010   Left main coronary artery is normal.   . CARDIOVASCULAR STRESS TEST  03-19-2010   0%  . CHOLECYSTECTOMY  1990  . poly removed from nose as a child    . PVC ablation  05/29/2010  . US ECHOCARDIOGRAPHY  03-09-2010   EF 55-60%     Medications: Current Meds  Medication Sig  . allopurinol (ZYLOPRIM) 300 MG tablet Take 300 mg by mouth daily.    Marland Kitchen amLODipine (NORVASC) 5 MG tablet Take 5 mg by mouth daily.  Marland Kitchen buPROPion (WELLBUTRIN SR) 100 MG 12 hr tablet Take 100 mg by mouth 2 (two) times daily.  Marland Kitchen doxazosin (CARDURA) 4 MG tablet  Take 1 tablet (4 mg total) by mouth daily.  . hydrALAZINE (APRESOLINE) 25 MG tablet TAKE 1 TABLET BY MOUTH TWICE A DAY  . lisinopril (PRINIVIL,ZESTRIL) 40 MG tablet Take 40 mg by mouth daily.   Marland Kitchen lovastatin (MEVACOR) 40 MG tablet Take 40 mg by mouth at bedtime.    . metoprolol succinate (TOPROL-XL) 25 MG 24 hr tablet Take 25 mg by mouth 2 (two) times daily.   . OXYGEN Inhale 2 L into the lungs at bedtime.  . sertraline (ZOLOFT) 100 MG tablet Take 100 mg by mouth daily.  . traZODone (DESYREL) 100 MG tablet Take 100 mg by mouth at bedtime.    Marland Kitchen warfarin (COUMADIN) 5 MG tablet Take 1 tablet daily except 1/2 tablet on Tues/Thurs/Sat or as directed by Anticoagulation Clinic     Allergies: No Known Allergies  Social History: The  patient  reports that he quit smoking about 33 years ago. His smoking use included cigarettes. He has a 60.00 pack-year smoking history. He has never used smokeless tobacco. He reports that he drinks alcohol. He reports that he does not use drugs.   Family History: The patient's family history includes Aneurysm (age of onset: 52) in his father; Breast cancer in his daughter and mother.   Review of Systems: Please see the history of present illness.   Otherwise, the review of systems is positive for none.   All other systems are reviewed and negative.   Physical Exam: VS:  BP 140/70 (BP Location: Left Arm, Patient Position: Sitting, Cuff Size: Normal)   Pulse (!) 58   Ht 5\' 10"  (1.778 m)   Wt 213 lb (96.6 kg)   SpO2 94% Comment: at rest  BMI 30.56 kg/m  .  BMI Body mass index is 30.56 kg/m.  Wt Readings from Last 3 Encounters:  07/22/18 213 lb (96.6 kg)  06/29/18 215 lb 3.2 oz (97.6 kg)  04/15/18 215 lb 6.4 oz (97.7 kg)    General: Pleasant. Elderly. Alert and in no acute distress.   HEENT: Normal.  Neck: Supple, no JVD, carotid bruits, or masses noted.  Cardiac: Heart tones are distant. No edema.  Respiratory:  Lungs are decreased but with normal work of breathing.  GI: Soft and nontender.  MS: No deformity or atrophy. Gait and ROM intact.  Skin: Warm and dry. Color is normal.  Neuro:  Strength and sensation are intact and no gross focal deficits noted.  Psych: Alert, appropriate and with normal affect.   LABORATORY DATA:  EKG:  EKG is not ordered today.  Lab Results  Component Value Date   WBC 5.9 04/15/2018   HGB 12.5 (L) 04/15/2018   HCT 37.6 04/15/2018   PLT 173 04/15/2018   GLUCOSE 96 04/15/2018   ALT 28 04/05/2011   AST 32 04/05/2011   NA 141 04/15/2018   K 4.9 04/15/2018   CL 104 04/15/2018   CREATININE 1.21 04/15/2018   BUN 38 (H) 04/15/2018   CO2 22 04/15/2018   TSH 1.66 10/14/2011   INR 1.7 (A) 07/20/2018     Lab Results  Component Value Date    INR 1.7 (A) 07/20/2018   INR 1.5 (A) 07/13/2018   INR 5.0 (A) 07/06/2018     BNP (last 3 results) No results for input(s): BNP in the last 8760 hours.  ProBNP (last 3 results) No results for input(s): PROBNP in the last 8760 hours.   Other Studies Reviewed Today:  Holter 02/2018 Narrative & Impression  Sinus rhythm with first degree AV block, Right bundle branch block Average heart rate is 62 bpm Occasional premature ventricular contractions Frequent premature atrial contractions with nonsustained atrial tachycardia Nonsustained atrial fibrillation also observed Nocturnal bradycardia with mobitz I second degree AV block is noted transiently No sustained arrhythmias No prolonged pauses      Myoview Impression from March 2016 Exercise Capacity: Lexiscan with no exercise. BP Response: Normal blood pressure response. Clinical Symptoms: Nausea. ECG Impression: Atrial fibrillation, no changes from baseline.  Comparison with Prior Nuclear Study: No images to compare  Overall Impression: Low risk stress nuclear study a medium-sized basal inferior and basal to mid inferolateral perfusion defect that is actually worse at rest than with stress. No ischemia. Given normal wall motion, this may represent diaphragmatic attenuation. .  LV Ejection Fraction: 63%. LV Wall Motion: NL LV Function; NL Wall Motion   Loralie Champagne 11/07/2014   Echo Study Conclusions from February 2016  - Left ventricle: The cavity size was normal. Wall thickness was increased in a pattern of mild LVH. Systolic function was normal. The estimated ejection fraction was in the range of 60% to 65%. Wall motion was normal; there were no regional wall motion abnormalities. - Aortic valve: There was trivial regurgitation. - Left atrium: The atrium was mildly dilated. - Right atrium: The atrium was mildly dilated.  Assessment/Plan:  1.PAF noted on prior Holter - his CHADSVASC is  at least 29 (age, HTN, CAD)- he was initially placed on Eliquis - now on Coumadin due to cost. He has had marked lability - he wishes to resume Eliquis - he took his dose of Coumadin this morning - will discontinue - start Eliquis on Friday at 5 mg BID. Samples given. Will see what assistance is available for him.   2.HTN - BP is ok - no changes made today.   3. ChronicDyspnea - He hadcardiac studiesback [id number] whichwere stable.He continues to participate in cardiac rehab.   4. PVCs - past ablation- remains on low dose Toprol. Not mentioned today  5. Obesity -down a few more pounds.   6. Depression - long standing issue- he has been more upbeat here lately and is so again today -  I think the pulmonary rehab is helping.   7. HLD - on statin therapy.  8. CAD - no active symptoms. Continue with current regimen.    Current medicines are reviewed with the patient today.  The patient does not have concerns regarding medicines other than what has been noted above.  The following changes have been made:  See above.  Labs/ tests ordered today include:   No orders of the defined types were placed in this encounter.    Disposition:   FU with me in 4 months.   Patient is agreeable to this plan and will call if any problems develop in the interim.   SignedTruitt Merle, NP  07/22/2018 9:20 AM  [facility] [address] [place], [place]  [address] Phone: [phone] Fax: [phone]

## 2018-07-23 ENCOUNTER — Encounter (HOSPITAL_COMMUNITY)
Admission: RE | Admit: 2018-07-23 | Discharge: 2018-07-23 | Disposition: A | Discharge status: Home / Self Care | Payer: Medicare Other | Source: Ambulatory Visit | Attending: Pulmonary Disease | Admitting: Pulmonary Disease

## 2018-07-28 ENCOUNTER — Encounter (HOSPITAL_COMMUNITY)
Admission: RE | Admit: 2018-07-28 | Discharge: 2018-07-28 | Disposition: A | Discharge status: Home / Self Care | Payer: Medicare Other | Source: Ambulatory Visit | Attending: Pulmonary Disease | Admitting: Pulmonary Disease

## 2018-07-30 ENCOUNTER — Encounter (HOSPITAL_COMMUNITY)
Admission: RE | Admit: 2018-07-30 | Discharge: 2018-07-30 | Disposition: A | Discharge status: Home / Self Care | Payer: Medicare Other | Source: Ambulatory Visit | Attending: Pulmonary Disease | Admitting: Pulmonary Disease

## 2018-08-03 ENCOUNTER — Telehealth: Payer: Self-pay | Admitting: Nurse Practitioner

## 2018-08-03 DIAGNOSIS — G4733 Obstructive sleep apnea (adult) (pediatric): Secondary | ICD-10-CM

## 2018-08-03 NOTE — Telephone Encounter (Signed)
OK to Rx, need to have a copy of his sleep study on file

## 2018-08-03 NOTE — Telephone Encounter (Signed)
Called and spoke with patient, the order has been placed. Patient stated that he had his sleep study at the neurological center and he would call to have a copy of it faxed here. Nothing further needed.

## 2018-08-03 NOTE — Telephone Encounter (Signed)
Called and spoke with patient, he stated that he is needing a new CPAP machine since his is 84 years old. He would like to get this from [facility].    BQ please advise, thank you.

## 2018-08-04 ENCOUNTER — Telehealth: Payer: Self-pay | Admitting: Nurse Practitioner

## 2018-08-04 ENCOUNTER — Encounter (HOSPITAL_COMMUNITY)
Admission: RE | Admit: 2018-08-04 | Discharge: 2018-08-04 | Disposition: A | Discharge status: Home / Self Care | Payer: Medicare Other | Source: Ambulatory Visit | Attending: Pulmonary Disease | Admitting: Pulmonary Disease

## 2018-08-04 DIAGNOSIS — G4733 Obstructive sleep apnea (adult) (pediatric): Secondary | ICD-10-CM

## 2018-08-04 NOTE — Telephone Encounter (Signed)
ATC Rodney, [facility], LM on VM concerning the CPAP settings. Per message, [facility] is needing settings for new CPAP machine.  Last settings were at  03/28/17 OV.    Will route message to Kenney Houseman, NP to advise

## 2018-08-05 NOTE — Telephone Encounter (Signed)
I am not sure why the last 2 telephone notes have my name attached. Based on what I saw in the chart, Dr. [name] was the one that said the CPAP was to be ordered. Please ask him.

## 2018-08-05 NOTE — Telephone Encounter (Signed)
His CPAP titration study last year suggested an autotitrating CPAP machine So let's go with Autotitrating 5-20 cm H20

## 2018-08-05 NOTE — Telephone Encounter (Signed)
Called Barbaraann Rondo, unable to reach left message to give [place] a call back.

## 2018-08-05 NOTE — Telephone Encounter (Signed)
BQ please advise, thank you.

## 2018-08-05 NOTE — Telephone Encounter (Signed)
[name] please advise on patients settings, thank you.

## 2018-08-07 NOTE — Telephone Encounter (Signed)
Spoke with Barbaraann Rondo, advised him of the pressure settings and sent an order to [facility]. Nothing further is needed.

## 2018-08-07 NOTE — Telephone Encounter (Signed)
Barbaraann Rondo returning Heather's call, CB is [phone]

## 2018-08-11 ENCOUNTER — Encounter (HOSPITAL_COMMUNITY)
Admission: RE | Admit: 2018-08-11 | Discharge: 2018-08-11 | Disposition: A | Discharge status: Home / Self Care | Payer: Self-pay | Source: Ambulatory Visit | Attending: Pulmonary Disease | Admitting: Pulmonary Disease

## 2018-08-11 DIAGNOSIS — J432 Centrilobular emphysema: Secondary | ICD-10-CM | POA: Insufficient documentation

## 2018-08-13 ENCOUNTER — Encounter (HOSPITAL_COMMUNITY)
Admission: RE | Admit: 2018-08-13 | Discharge: 2018-08-13 | Disposition: A | Discharge status: Home / Self Care | Payer: Self-pay | Source: Ambulatory Visit | Attending: Pulmonary Disease | Admitting: Pulmonary Disease

## 2018-08-17 ENCOUNTER — Other Ambulatory Visit: Payer: Self-pay | Admitting: Internal Medicine

## 2018-08-17 ENCOUNTER — Telehealth: Payer: Self-pay | Admitting: Nurse Practitioner

## 2018-08-17 NOTE — Telephone Encounter (Deleted)
Spoke with pt, he states

## 2018-08-17 NOTE — Telephone Encounter (Signed)
I spoke with pt, he stated he had a sleep study at [facility] but when I called they stated they didn't have him as a patient and that they don't perform sleep studies. I even called [facility] and they didn't have him as a patient either. He also stated he didn't know if the test was accurate because he kept getting up from the test to use the bathroom. This may be why there is missing information on the report.   Dr. [name] do you think we should just order a sleep test and just start over? Please advise.

## 2018-08-17 NOTE — Telephone Encounter (Signed)
Patient returning call, states he thought he used [facility].  States they are part of [place].  CB is [phone].

## 2018-08-17 NOTE — Telephone Encounter (Signed)
So according to previous message on 11/25 there was paperwork to be faxed. Do not see any message related to if we received the paper work. I asked BQs nurse and she hasn't received anything. ATC patient to see which neurological center he uses so they could fax the paper work over.  Barbaraann Rondo at [facility] states patients 2011 Sleep study doesn't have an AHI and new CPAP cannot be ordered off of a titration study.   I left a message for patient to call back so we could get the information.  Also when paperwork is received, and with further routing of this message please send to BQ not [place]. Thank you  Will leave in box

## 2018-08-18 ENCOUNTER — Encounter (HOSPITAL_COMMUNITY)
Admission: RE | Admit: 2018-08-18 | Discharge: 2018-08-18 | Disposition: A | Discharge status: Home / Self Care | Payer: Medicare Other | Source: Ambulatory Visit | Attending: Pulmonary Disease | Admitting: Pulmonary Disease

## 2018-08-18 NOTE — Telephone Encounter (Signed)
If he wants a new machine then it seems he will need a split night study

## 2018-08-19 NOTE — Telephone Encounter (Signed)
noted 

## 2018-08-19 NOTE — Telephone Encounter (Signed)
Called and spoke with patient. He states he does not want to go through another sleep study and was told one time he had sleep apnea and one time he didn't.  Called Barbaraann Rondo let him know patient is not wanting to have sleep study so does not need new machine at this time  I will let Dr. [name] know as a FYI  Nothing further needed at this time.

## 2018-08-20 ENCOUNTER — Encounter (HOSPITAL_COMMUNITY)
Admission: RE | Admit: 2018-08-20 | Discharge: 2018-08-20 | Disposition: A | Discharge status: Home / Self Care | Payer: Self-pay | Source: Ambulatory Visit | Attending: Pulmonary Disease | Admitting: Pulmonary Disease

## 2018-08-25 ENCOUNTER — Encounter (HOSPITAL_COMMUNITY)
Admission: RE | Admit: 2018-08-25 | Discharge: 2018-08-25 | Disposition: A | Discharge status: Home / Self Care | Payer: Self-pay | Source: Ambulatory Visit | Attending: Pulmonary Disease | Admitting: Pulmonary Disease

## 2018-08-27 ENCOUNTER — Encounter (HOSPITAL_COMMUNITY)
Admission: RE | Admit: 2018-08-27 | Discharge: 2018-08-27 | Disposition: A | Discharge status: Home / Self Care | Payer: Self-pay | Source: Ambulatory Visit | Attending: Pulmonary Disease | Admitting: Pulmonary Disease

## 2018-09-01 ENCOUNTER — Encounter (HOSPITAL_COMMUNITY): Payer: Self-pay

## 2018-09-03 ENCOUNTER — Encounter (HOSPITAL_COMMUNITY)
Admission: RE | Admit: 2018-09-03 | Discharge: 2018-09-03 | Disposition: A | Discharge status: Home / Self Care | Payer: Self-pay | Source: Ambulatory Visit | Attending: Pulmonary Disease | Admitting: Pulmonary Disease

## 2018-09-08 ENCOUNTER — Encounter (HOSPITAL_COMMUNITY)
Admission: RE | Admit: 2018-09-08 | Discharge: 2018-09-08 | Disposition: A | Discharge status: Home / Self Care | Payer: Self-pay | Source: Ambulatory Visit | Attending: Pulmonary Disease | Admitting: Pulmonary Disease

## 2018-09-10 ENCOUNTER — Telehealth: Payer: Self-pay | Admitting: Pulmonary Disease

## 2018-09-10 ENCOUNTER — Encounter (HOSPITAL_COMMUNITY)
Admission: RE | Admit: 2018-09-10 | Discharge: 2018-09-10 | Disposition: A | Discharge status: Home / Self Care | Payer: Medicare Other | Source: Ambulatory Visit | Attending: Pulmonary Disease | Admitting: Pulmonary Disease

## 2018-09-10 DIAGNOSIS — J432 Centrilobular emphysema: Secondary | ICD-10-CM | POA: Insufficient documentation

## 2018-09-10 NOTE — Telephone Encounter (Signed)
Call made to pulmonary rehab Ria Comment in on the floor working with a patient. Will give [place] a call back.   Call made to [facility], left message with Jeani Hawking for update regarding patient POC.

## 2018-09-10 NOTE — Telephone Encounter (Signed)
Pulmonary Rehab is closed now and will need to call back tomorrow morning. Also please call [facility] back prior to Pulmonary Rehab.

## 2018-09-10 NOTE — Telephone Encounter (Signed)
Lattie Haw is calling back [phone]

## 2018-09-14 NOTE — Telephone Encounter (Signed)
atc pulm rehab, Lattie Haw does not work on Mondays.  Was advised to call back tomorrow.  wcb.

## 2018-09-15 ENCOUNTER — Encounter (HOSPITAL_COMMUNITY)
Admission: RE | Admit: 2018-09-15 | Discharge: 2018-09-15 | Disposition: A | Discharge status: Home / Self Care | Payer: Self-pay | Source: Ambulatory Visit | Attending: Pulmonary Disease | Admitting: Pulmonary Disease

## 2018-09-15 NOTE — Telephone Encounter (Signed)
Called and spoke with Lattie Haw, she stated that the patient has not heard anything from [facility] in regards to his POC. [facility] can you help out with this?

## 2018-09-15 NOTE — Telephone Encounter (Signed)
I am calling [facility] to check on this.

## 2018-09-15 NOTE — Telephone Encounter (Signed)
Called [facility] & spoke to Chacra.  She couldn't find the order.  She spoke to [place] and they printed the order.  She states they will check to see what they can do and will contact the patient today.  Nothing further needed from [place] at this time.

## 2018-09-17 ENCOUNTER — Encounter (HOSPITAL_COMMUNITY)
Admission: RE | Admit: 2018-09-17 | Discharge: 2018-09-17 | Disposition: A | Discharge status: Home / Self Care | Payer: Self-pay | Source: Ambulatory Visit | Attending: Pulmonary Disease | Admitting: Pulmonary Disease

## 2018-09-22 ENCOUNTER — Encounter (HOSPITAL_COMMUNITY)
Admission: RE | Admit: 2018-09-22 | Discharge: 2018-09-22 | Disposition: A | Discharge status: Home / Self Care | Payer: Self-pay | Source: Ambulatory Visit | Attending: Pulmonary Disease | Admitting: Pulmonary Disease

## 2018-09-24 ENCOUNTER — Encounter (HOSPITAL_COMMUNITY)
Admission: RE | Admit: 2018-09-24 | Discharge: 2018-09-24 | Disposition: A | Discharge status: Home / Self Care | Payer: Self-pay | Source: Ambulatory Visit | Attending: Pulmonary Disease | Admitting: Pulmonary Disease

## 2018-09-29 ENCOUNTER — Encounter (HOSPITAL_COMMUNITY)
Admission: RE | Admit: 2018-09-29 | Discharge: 2018-09-29 | Disposition: A | Discharge status: Home / Self Care | Payer: Self-pay | Source: Ambulatory Visit | Attending: Pulmonary Disease | Admitting: Pulmonary Disease

## 2018-09-30 ENCOUNTER — Telehealth (HOSPITAL_COMMUNITY): Payer: Self-pay | Admitting: Internal Medicine

## 2018-10-01 ENCOUNTER — Encounter (HOSPITAL_COMMUNITY): Payer: Self-pay

## 2018-10-01 ENCOUNTER — Telehealth: Payer: Self-pay | Admitting: Pulmonary Disease

## 2018-10-01 NOTE — Telephone Encounter (Signed)
Spoke with the pt's daughter  The pt left town without o2  She states needing order faxed to [facility] in [place]  I called there and spoke with Ramona  Will fax the order for the o2 to [facility] at [phone]  Spoke with pt's daughter and made her aware that this was done

## 2018-10-01 NOTE — Telephone Encounter (Signed)
Deliver to [address], [place].  Have [facility] to call the daughter.

## 2018-10-06 ENCOUNTER — Encounter (HOSPITAL_COMMUNITY)
Admission: RE | Admit: 2018-10-06 | Discharge: 2018-10-06 | Disposition: A | Discharge status: Home / Self Care | Payer: Self-pay | Source: Ambulatory Visit | Attending: Pulmonary Disease | Admitting: Pulmonary Disease

## 2018-10-06 ENCOUNTER — Telehealth: Payer: Self-pay | Admitting: Pulmonary Disease

## 2018-10-06 NOTE — Telephone Encounter (Signed)
I have called and left a message for the patient to bring  His SD card or cpap to apt.

## 2018-10-07 ENCOUNTER — Ambulatory Visit (INDEPENDENT_AMBULATORY_CARE_PROVIDER_SITE_OTHER): Payer: Medicare Other | Admitting: Pulmonary Disease

## 2018-10-07 ENCOUNTER — Encounter: Payer: Self-pay | Admitting: Pulmonary Disease

## 2018-10-07 VITALS — BP 128/62 | HR 78 | Ht 70.0 in | Wt 211.8 lb

## 2018-10-07 DIAGNOSIS — G4733 Obstructive sleep apnea (adult) (pediatric): Secondary | ICD-10-CM | POA: Diagnosis not present

## 2018-10-07 NOTE — Progress Notes (Signed)
[name]    [id number]    [birth year]  Primary Care Physician:Paterson, Quillian Quince, MD  Referring Physician: [name], MD [address] [place], [place] [address]  Chief complaint:   History of obstructive sleep apnea diagnosed in the 90s Has been on CPAP since then Recent suboptimal titration study  HPI:  Patient with a history of obstructive sleep apnea diagnosed in the 1990s and has been on CPAP since He recently had a titration study done-could not sleep Was only on CPAP of 7-stated he had a miserable time in the lab  Usually tries to go to bed between 8 PM and 8:30 PM, final awakening about 7 AM  Wakes up about 3-4 times during the night to use the bathroom  History of chronic obstructive pulmonary disease, chronic respiratory failure  Denies any other ongoing health problems  Memory is good, no difficulty concentrating He does have dryness of his mouth in the mornings on occasions   Outpatient Encounter Medications as of 10/07/2018  Medication Sig  . allopurinol (ZYLOPRIM) 300 MG tablet Take 300 mg by mouth daily.    Marland Kitchen amLODipine (NORVASC) 5 MG tablet Take 5 mg by mouth daily.  Marland Kitchen apixaban (ELIQUIS) 5 MG TABS tablet Take 1 tablet (5 mg total) by mouth 2 (two) times daily.  Marland Kitchen buPROPion (WELLBUTRIN SR) 100 MG 12 hr tablet Take 100 mg by mouth 2 (two) times daily.  Marland Kitchen doxazosin (CARDURA) 4 MG tablet Take 1 tablet (4 mg total) by mouth daily.  . hydrALAZINE (APRESOLINE) 25 MG tablet TAKE 1 TABLET BY MOUTH TWICE A DAY  . lisinopril (PRINIVIL,ZESTRIL) 40 MG tablet Take 40 mg by mouth daily.   Marland Kitchen lovastatin (MEVACOR) 40 MG tablet Take 40 mg by mouth at bedtime.    . metoprolol succinate (TOPROL-XL) 25 MG 24 hr tablet Take 25 mg by mouth 2 (two) times daily.   . OXYGEN Inhale 2 L into the lungs at bedtime.  . sertraline (ZOLOFT) 100 MG tablet Take 100 mg by mouth daily.  . traZODone (DESYREL) 100 MG tablet Take 100 mg by mouth at bedtime.    .  [DISCONTINUED] warfarin (COUMADIN) 5 MG tablet Take 1 tablet daily except 1/2 tablet on Tues/Thurs/Sat or as directed by Anticoagulation Clinic   No facility-administered encounter medications on file as of 10/07/2018.     Allergies as of 10/07/2018  . (No Known Allergies)    Past Medical History:  Diagnosis Date  . BPH (benign prostatic hyperplasia)   . Coronary artery ectasia    Mild CAD with normal systolic function per cath in August of 2011  . Diaphragmatic paralysis    felt to be partially responsible for dyspnea  . Dyspnea    due to paralyzed diaphragm and pulmonary issues  . Gout   . HTN (hypertension)   . Hypercholesteremia   . Microhematuria   . Obesity   . OSA (obstructive sleep apnea)   . Paroxysmal atrial fibrillation (HCC)    occured in the setting of acute E Coli sepsis and ileius 7/12  . PVC (premature ventricular contraction)    s/p PVC ablation 05/29/2010    Past Surgical History:  Procedure Laterality Date  . APPENDECTOMY  1978  . CARDIAC CATHETERIZATION  04-30-2010   Left main coronary artery is normal.   . CARDIOVASCULAR STRESS TEST  03-19-2010   0%  . CHOLECYSTECTOMY  1990  . poly removed from nose as a child    .  PVC ablation  05/29/2010  . US ECHOCARDIOGRAPHY  03-09-2010   EF 55-60%    Family History  Problem Relation Age of Onset  . Aneurysm Father 71       brain aneurysm  . Breast cancer Mother   . Breast cancer Daughter     Social History   Socioeconomic History  . Marital status: Married    Spouse name: Not on file  . Number of children: Not on file  . Years of education: Not on file  . Highest education level: Not on file  Occupational History  . Occupation: Retired  Scientific laboratory technician  . Financial resource strain: Not on file  . Food insecurity:    Worry: Not on file    Inability: Not on file  . Transportation needs:    Medical: Not on file    Non-medical: Not on file  Tobacco Use  . Smoking status: Former Smoker    Packs/day:  3.00    Years: 20.00    Pack years: 60.00    Types: Cigarettes    Last attempt to quit: 09/09/1984    Years since quitting: 34.0  . Smokeless tobacco: Never Used  Substance and Sexual Activity  . Alcohol use: Yes    Alcohol/week: 0.0 standard drinks    Comment: 1 glass of wine per week  . Drug use: No  . Sexual activity: Yes  Lifestyle  . Physical activity:    Days per week: Not on file    Minutes per session: Not on file  . Stress: Not on file  Relationships  . Social connections:    Talks on phone: Not on file    Gets together: Not on file    Attends religious service: Not on file    Active member of club or organization: Not on file    Attends meetings of clubs or organizations: Not on file    Relationship status: Not on file  . Intimate partner violence:    Fear of current or ex partner: Not on file    Emotionally abused: Not on file    Physically abused: Not on file    Forced sexual activity: Not on file  Other Topics Concern  . Not on file  Social History Narrative  . Not on file    Review of Systems  Constitutional: Negative.   HENT: Negative.   Eyes: Negative.   Respiratory: Negative for shortness of breath and wheezing.   Cardiovascular: Negative for chest pain and palpitations.  Gastrointestinal: Negative.   Endocrine: Negative.   Musculoskeletal: Positive for arthralgias.    Vitals:   10/07/18 0931  BP: 128/62  Pulse: 78  SpO2: 94%     Physical Exam  Constitutional: He is oriented to person, place, and time. He appears well-developed and well-nourished.  HENT:  Head: Normocephalic and atraumatic.  Eyes: Pupils are equal, round, and reactive to light. Conjunctivae are normal. Right eye exhibits no discharge. Left eye exhibits no discharge.  Neck: Normal range of motion. Neck supple. No tracheal deviation present. No thyromegaly present.  Cardiovascular: Normal rate and regular rhythm.  Pulmonary/Chest: Effort normal and breath sounds normal. No  respiratory distress. He has no wheezes. He has no rales. He exhibits no tenderness.  Abdominal: Soft. Bowel sounds are normal. He exhibits no distension. There is no abdominal tenderness. There is no rebound.  Musculoskeletal: Normal range of motion.        General: No deformity or edema.  Neurological: He is alert and  oriented to person, place, and time.  Skin: Skin is warm and dry. No erythema.  Psychiatric: He has a normal mood and affect. His behavior is normal.    Results of the Epworth flowsheet 10/07/2018  Sitting and reading 1  Watching TV 0  Sitting, inactive in a public place (e.g. a theatre or a meeting) 0  As a passenger in a car for an hour without a break 0  Lying down to rest in the afternoon when circumstances permit 3  Sitting and talking to someone 0  Sitting quietly after a lunch without alcohol 0  In a car, while stopped for a few minutes in traffic 0  Total score 4   Data Reviewed: Records reviewed Assessment:  History of obstructive sleep apnea -History of obstructive sleep apnea -Most recent titration study was a suboptimal as patient could not sleep at all while he was in the sleep lab -States he is very compliant with CPAP use -CPAP does help his symptoms -Currently uses a loaner machine as his own machine did break  Daytime sleepiness  Chronic obstructive pulmonary disease -On bronchodilators -Follows up with Dr. [name]  Chronic respiratory failure -Continues on oxygen  Plan/Recommendations:  Institute auto titrating CPAP 5-20 Based on his history of obstructive sleep apnea, good compliance with improvement in symptoms Oxygen supplementation at 3 L piped into the system  I did discuss possibility of needing a home sleep study which he is okay with  He already has a definite diagnosis of sleep disordered breathing with improvement in symptoms with CPAP use Iva he cannot sleep in the sleep lab he states  We will continue to monitor  closely  We will see him back in the office in about 3 months encourage to call with any significant concerns   Sherrilyn Rist MD [facility] 10/07/2018, 9:38 AM  CC: [name], MD

## 2018-10-07 NOTE — Patient Instructions (Signed)
History of obstructive sleep apnea  We will contact the DME company to try and set you up with an auto titrating CPAP  As you were not able to sleep in the lab, a home sleep study will be our other option to confirm the diagnosis of sleep apnea  Continue with current treatment  I will see you back in the office in about 3 months   Sleep Apnea Sleep apnea is a condition in which breathing pauses or becomes shallow during sleep. Episodes of sleep apnea usually last 10 seconds or longer, and they may occur as many as 20 times an hour. Sleep apnea disrupts your sleep and keeps your body from getting the rest that it needs. This condition can increase your risk of certain health problems, including:  Heart attack.  Stroke.  Obesity.  Diabetes.  Heart failure.  Irregular heartbeat. There are three kinds of sleep apnea:  Obstructive sleep apnea. This kind is caused by a blocked or collapsed airway.  Central sleep apnea. This kind happens when the part of the brain that controls breathing does not send the correct signals to the muscles that control breathing.  Mixed sleep apnea. This is a combination of obstructive and central sleep apnea. What are the causes? The most common cause of this condition is a collapsed or blocked airway. An airway can collapse or become blocked if:  Your throat muscles are abnormally relaxed.  Your tongue and tonsils are larger than normal.  You are overweight.  Your airway is smaller than normal. What increases the risk? This condition is more likely to develop in people who:  Are overweight.  Smoke.  Have a smaller than normal airway.  Are elderly.  Are male.  Drink alcohol.  Take sedatives or tranquilizers.  Have a family history of sleep apnea. What are the signs or symptoms? Symptoms of this condition include:  Trouble staying asleep.  Daytime sleepiness and tiredness.  Irritability.  Loud snoring.  Morning  headaches.  Trouble concentrating.  Forgetfulness.  Decreased interest in sex.  Unexplained sleepiness.  Mood swings.  Personality changes.  Feelings of depression.  Waking up often during the night to urinate.  Dry mouth.  Sore throat. How is this diagnosed? This condition may be diagnosed with:  A medical history.  A physical exam.  A series of tests that are done while you are sleeping (sleep study). These tests are usually done in a sleep lab, but they may also be done at home. How is this treated? Treatment for this condition aims to restore normal breathing and to ease symptoms during sleep. It may involve managing health issues that can affect breathing, such as high blood pressure or obesity. Treatment may include:  Sleeping on your side.  Using a decongestant if you have nasal congestion.  Avoiding the use of depressants, including alcohol, sedatives, and narcotics.  Losing weight if you are overweight.  Making changes to your diet.  Quitting smoking.  Using a device to open your airway while you sleep, such as: ? An oral appliance. This is a custom-made mouthpiece that shifts your lower jaw forward. ? A continuous positive airway pressure (CPAP) device. This device delivers oxygen to your airway through a mask. ? A nasal expiratory positive airway pressure (EPAP) device. This device has valves that you put into each nostril. ? A bi-level positive airway pressure (BPAP) device. This device delivers oxygen to your airway through a mask.  Surgery if other treatments do not work. During  surgery, excess tissue is removed to create a wider airway. It is important to get treatment for sleep apnea. Without treatment, this condition can lead to:  High blood pressure.  Coronary artery disease.  (Men) An inability to achieve or maintain an erection (impotence).  Reduced thinking abilities. Follow these instructions at home:  Make any lifestyle changes that  your health care provider recommends.  Eat a healthy, well-balanced diet.  Take over-the-counter and prescription medicines only as told by your health care provider.  Avoid using depressants, including alcohol, sedatives, and narcotics.  Take steps to lose weight if you are overweight.  If you were given a device to open your airway while you sleep, use it only as told by your health care provider.  Do not use any tobacco products, such as cigarettes, chewing tobacco, and e-cigarettes. If you need help quitting, ask your health care provider.  Keep all follow-up visits as told by your health care provider. This is important. Contact a health care provider if:  The device that you received to open your airway during sleep is uncomfortable or does not seem to be working.  Your symptoms do not improve.  Your symptoms get worse. Get help right away if:  You develop chest pain.  You develop shortness of breath.  You develop discomfort in your back, arms, or stomach.  You have trouble speaking.  You have weakness on one side of your body.  You have drooping in your face. These symptoms may represent a serious problem that is an emergency. Do not wait to see if the symptoms will go away. Get medical help right away. Call your local emergency services (911 in the [place] Do not drive yourself to the hospital. This information is not intended to replace advice given to you by your health care provider. Make sure you discuss any questions you have with your health care provider. Document Released: 08/16/2002 Document Revised: 03/24/2017 Document Reviewed: 06/05/2015 Elsevier Interactive Patient Education  2019 [employer].

## 2018-10-08 ENCOUNTER — Telehealth: Payer: Self-pay | Admitting: Pulmonary Disease

## 2018-10-08 ENCOUNTER — Encounter (HOSPITAL_COMMUNITY)
Admission: RE | Admit: 2018-10-08 | Discharge: 2018-10-08 | Disposition: A | Discharge status: Home / Self Care | Payer: Self-pay | Source: Ambulatory Visit | Attending: Pulmonary Disease | Admitting: Pulmonary Disease

## 2018-10-08 NOTE — Telephone Encounter (Signed)
Sent message to [facility] to ck chart for this sleep study Joellen Jersey

## 2018-10-09 NOTE — Telephone Encounter (Signed)
It has been faxed since [facility] can't seem to find it in the chart.

## 2018-10-09 NOTE — Telephone Encounter (Signed)
Noted by triage. Left message with [facility] making them aware sleep study has been refaxed. Nothing further is needed at this time.

## 2018-10-09 NOTE — Telephone Encounter (Signed)
[name] from [facility] states copy of sleep study is not in [place].  Could not locate it in Epic.  Needs baseline sleep study results.  [name] phone number is [phone].

## 2018-10-13 ENCOUNTER — Telehealth: Payer: Self-pay | Admitting: Pulmonary Disease

## 2018-10-13 ENCOUNTER — Encounter (HOSPITAL_COMMUNITY)
Admission: RE | Admit: 2018-10-13 | Discharge: 2018-10-13 | Disposition: A | Discharge status: Home / Self Care | Payer: Self-pay | Source: Ambulatory Visit | Attending: Pulmonary Disease | Admitting: Pulmonary Disease

## 2018-10-13 DIAGNOSIS — J432 Centrilobular emphysema: Secondary | ICD-10-CM | POA: Insufficient documentation

## 2018-10-13 DIAGNOSIS — G4733 Obstructive sleep apnea (adult) (pediatric): Secondary | ICD-10-CM

## 2018-10-13 NOTE — Telephone Encounter (Signed)
Spoke with [place]. She stated that she has the copy of the sleep study from 2011 but the AHI on the report is 0, therefore the patient does not have a diagnosis of OSA. She can not use the CPAP titration from 2018 to qualify the patient. Per Jeanett Schlein, the patient will need to have a new sleep study.   AO, are you ok with [place] ordering another sleep study for the patient? Please advise. Thanks!

## 2018-10-13 NOTE — Telephone Encounter (Signed)
Yes I am okay with that.

## 2018-10-13 NOTE — Telephone Encounter (Signed)
Dr. Ander Slade, would you please clarify which sleep study you would like to order?  Thanks.

## 2018-10-14 NOTE — Telephone Encounter (Signed)
Order home sleep study  He stated he will not be able to sleep in a Lab

## 2018-10-14 NOTE — Telephone Encounter (Signed)
Order for Home Sleep Study has been placed. Call made to patient to make aware he will have to repeat his sleep study. Voiced understanding. Call made to [facility] to make aware we would be repeating sleep study. Nothing further is needed at this time.

## 2018-10-15 ENCOUNTER — Encounter (HOSPITAL_COMMUNITY)
Admission: RE | Admit: 2018-10-15 | Discharge: 2018-10-15 | Disposition: A | Discharge status: Home / Self Care | Payer: Self-pay | Source: Ambulatory Visit | Attending: Pulmonary Disease | Admitting: Pulmonary Disease

## 2018-10-20 ENCOUNTER — Encounter (HOSPITAL_COMMUNITY)
Admission: RE | Admit: 2018-10-20 | Discharge: 2018-10-20 | Disposition: A | Discharge status: Home / Self Care | Payer: Self-pay | Source: Ambulatory Visit | Attending: Pulmonary Disease | Admitting: Pulmonary Disease

## 2018-10-22 ENCOUNTER — Encounter (HOSPITAL_COMMUNITY)
Admission: RE | Admit: 2018-10-22 | Discharge: 2018-10-22 | Disposition: A | Discharge status: Home / Self Care | Payer: Self-pay | Source: Ambulatory Visit | Attending: Pulmonary Disease | Admitting: Pulmonary Disease

## 2018-10-27 ENCOUNTER — Encounter (HOSPITAL_COMMUNITY)
Admission: RE | Admit: 2018-10-27 | Discharge: 2018-10-27 | Disposition: A | Discharge status: Home / Self Care | Payer: Self-pay | Source: Ambulatory Visit | Attending: Pulmonary Disease | Admitting: Pulmonary Disease

## 2018-10-27 DIAGNOSIS — G4733 Obstructive sleep apnea (adult) (pediatric): Secondary | ICD-10-CM

## 2018-10-29 ENCOUNTER — Encounter (HOSPITAL_COMMUNITY)
Admission: RE | Admit: 2018-10-29 | Discharge: 2018-10-29 | Disposition: A | Discharge status: Home / Self Care | Payer: Self-pay | Source: Ambulatory Visit | Attending: Pulmonary Disease | Admitting: Pulmonary Disease

## 2018-11-03 ENCOUNTER — Encounter (HOSPITAL_COMMUNITY)
Admission: RE | Admit: 2018-11-03 | Discharge: 2018-11-03 | Disposition: A | Discharge status: Home / Self Care | Payer: Self-pay | Source: Ambulatory Visit | Attending: Pulmonary Disease | Admitting: Pulmonary Disease

## 2018-11-03 DIAGNOSIS — G4733 Obstructive sleep apnea (adult) (pediatric): Secondary | ICD-10-CM

## 2018-11-05 ENCOUNTER — Encounter (HOSPITAL_COMMUNITY)
Admission: RE | Admit: 2018-11-05 | Discharge: 2018-11-05 | Disposition: A | Discharge status: Home / Self Care | Payer: Self-pay | Source: Ambulatory Visit | Attending: Pulmonary Disease | Admitting: Pulmonary Disease

## 2018-11-10 ENCOUNTER — Encounter (HOSPITAL_COMMUNITY)
Admission: RE | Admit: 2018-11-10 | Discharge: 2018-11-10 | Disposition: A | Discharge status: Home / Self Care | Payer: Self-pay | Source: Ambulatory Visit | Attending: Pulmonary Disease | Admitting: Pulmonary Disease

## 2018-11-10 DIAGNOSIS — J432 Centrilobular emphysema: Secondary | ICD-10-CM | POA: Insufficient documentation

## 2018-11-11 ENCOUNTER — Encounter: Payer: Self-pay | Admitting: Nurse Practitioner

## 2018-11-11 ENCOUNTER — Ambulatory Visit: Payer: Medicare Other | Admitting: Nurse Practitioner

## 2018-11-11 VITALS — BP 108/52 | HR 52 | Ht 70.0 in | Wt 211.0 lb

## 2018-11-11 DIAGNOSIS — I259 Chronic ischemic heart disease, unspecified: Secondary | ICD-10-CM | POA: Diagnosis not present

## 2018-11-11 DIAGNOSIS — R0602 Shortness of breath: Secondary | ICD-10-CM

## 2018-11-11 DIAGNOSIS — I10 Essential (primary) hypertension: Secondary | ICD-10-CM

## 2018-11-11 DIAGNOSIS — I480 Paroxysmal atrial fibrillation: Secondary | ICD-10-CM

## 2018-11-11 DIAGNOSIS — Z79899 Other long term (current) drug therapy: Secondary | ICD-10-CM | POA: Diagnosis not present

## 2018-11-11 NOTE — Progress Notes (Signed)
CARDIOLOGY OFFICE NOTE  Date:  11/11/2018    [name] Date of Birth: 1933/10/31 Medical Record #[id number]  PCP:  [name], MD  Cardiologist:  [name]    Chief Complaint  Patient presents with  . Follow-up    4 month check. Seen for Dr. [name]    History of Present Illness: [name] is a 85 y.o. male who presents today for a follow up visit. This is a 4 month check. Seen for Dr. [name]. He has seen Dr. [name] in the remote past. Primarily follows with me.   He has a h/o PVC ablation 05/29/10, PAF, obesity, sedentary lifestyle, HLD, gout, OSA treated with CPAP, &dysfunctional diaphragm with chronic dyspneapreviouslyfollowed by Dr. [name] - now with Dr. [name]. He has mild CAD per cath back in August of 2011. Labs are checked by primary care.   Seen back in February of 2016 by Dr. [name] - this was for evaluation of dyspnea. Dr. [name] wanted cardiac status reevaluated to assess for any cardiac issues that may be contributing to dyspnea. Was referred for Myoview and echocardiogram. These studies were satisfactory. Encouraged to cut back on his alcohol intake as well.   I have followed him since - he has been holding his own - dyspnea is chronic.He has gotten back with pulmonary - now seeing Dr. [name].Balance issues and has had some falls. Has ended up having documented AF - now on anticoagulation - initially with Eliquis - but then transitioned to Coumadin due to cost and then back to Eliquis. Has also needed some regulation of his BP medicines. Last seen in November and he was doing well - had lost a few more pounds of weight. Was transitioned back to Eliquis due to labile INRs.   Comes back today. Here alone.He says he is doing ok. Continues to participate in pulmonary rehab. He is now on Abilify. His breathing is stable. No chest pain. He is planning on having eye lid surgery in May - this should be fine from our standpoint. No  palpitations. No bleeding. Tolerating his Eliquis. He looks to be holding his own for now.   Past Medical History:  Diagnosis Date  . BPH (benign prostatic hyperplasia)   . Coronary artery ectasia    Mild CAD with normal systolic function per cath in August of 2011  . Diaphragmatic paralysis    felt to be partially responsible for dyspnea  . Dyspnea    due to paralyzed diaphragm and pulmonary issues  . Gout   . HTN (hypertension)   . Hypercholesteremia   . Microhematuria   . Obesity   . OSA (obstructive sleep apnea)   . Paroxysmal atrial fibrillation (HCC)    occured in the setting of acute E Coli sepsis and ileius 7/12  . PVC (premature ventricular contraction)    s/p PVC ablation 05/29/2010    Past Surgical History:  Procedure Laterality Date  . APPENDECTOMY  1978  . CARDIAC CATHETERIZATION  04-30-2010   Left main coronary artery is normal.   . CARDIOVASCULAR STRESS TEST  03-19-2010   0%  . CHOLECYSTECTOMY  1990  . poly removed from nose as a child    . PVC ablation  05/29/2010  . US ECHOCARDIOGRAPHY  03-09-2010   EF 55-60%     Medications: Current Meds  Medication Sig  . allopurinol (ZYLOPRIM) 300 MG tablet Take 300 mg by mouth daily.    Marland Kitchen amLODipine (NORVASC) 5 MG tablet Take  5 mg by mouth daily.  Marland Kitchen apixaban (ELIQUIS) 5 MG TABS tablet Take 1 tablet (5 mg total) by mouth 2 (two) times daily.  . ARIPiprazole (ABILIFY) 5 MG tablet Take 5 mg by mouth daily.  Marland Kitchen buPROPion (WELLBUTRIN SR) 100 MG 12 hr tablet Take 100 mg by mouth 2 (two) times daily.  Marland Kitchen doxazosin (CARDURA) 4 MG tablet Take 1 tablet (4 mg total) by mouth daily.  . hydrALAZINE (APRESOLINE) 25 MG tablet TAKE 1 TABLET BY MOUTH TWICE A DAY  . lisinopril (PRINIVIL,ZESTRIL) 40 MG tablet Take 40 mg by mouth daily.   Marland Kitchen lovastatin (MEVACOR) 40 MG tablet Take 40 mg by mouth at bedtime.    . metoprolol succinate (TOPROL-XL) 25 MG 24 hr tablet Take 25 mg by mouth 2 (two) times daily.   . OXYGEN Inhale 2 L into the lungs  at bedtime.  . sertraline (ZOLOFT) 100 MG tablet Take 100 mg by mouth daily.  . traZODone (DESYREL) 100 MG tablet Take 100 mg by mouth at bedtime.       Allergies: No Known Allergies  Social History: The patient  reports that he quit smoking about 34 years ago. His smoking use included cigarettes. He has a 60.00 pack-year smoking history. He has never used smokeless tobacco. He reports current alcohol use. He reports that he does not use drugs.   Family History: The patient's family history includes Aneurysm (age of onset: 39) in his father; Breast cancer in his daughter and mother.   Review of Systems: Please see the history of present illness.   Otherwise, the review of systems is positive for none.   All other systems are reviewed and negative.   Physical Exam: VS:  BP (!) 108/52 (BP Location: Left Arm, Patient Position: Sitting, Cuff Size: Normal)   Pulse (!) 52   Ht 5\' 10"  (1.778 m)   Wt 211 lb (95.7 kg)   SpO2 92% Comment: at rest  BMI 30.28 kg/m  .  BMI Body mass index is 30.28 kg/m.  Wt Readings from Last 3 Encounters:  11/11/18 211 lb (95.7 kg)  10/07/18 211 lb 12.8 oz (96.1 kg)  07/22/18 213 lb (96.6 kg)    General: Pleasant. Elderly. Alert and in no acute distress.   HEENT: Normal. He has bilateral ptosis.  Neck: Supple, no JVD, carotid bruits, or masses noted.  Cardiac: Regular rate and rhythm. Frequent ectopics. Heart sounds are distant.   No edema.  Respiratory:  Lungs are clear to auscultation bilaterally with normal work of breathing.  GI: Soft and nontender.  MS: No deformity or atrophy. Gait and ROM intact.  Skin: Warm and dry. Color is normal.  Neuro:  Strength and sensation are intact and no gross focal deficits noted.  Psych: Alert, appropriate and with normal affect.   LABORATORY DATA:  EKG:  EKG is not ordered today.  Lab Results  Component Value Date   WBC 5.9 04/15/2018   HGB 12.5 (L) 04/15/2018   HCT 37.6 04/15/2018   PLT 173 04/15/2018    GLUCOSE 96 04/15/2018   ALT 28 04/05/2011   AST 32 04/05/2011   NA 141 04/15/2018   K 4.9 04/15/2018   CL 104 04/15/2018   CREATININE 1.21 04/15/2018   BUN 38 (H) 04/15/2018   CO2 22 04/15/2018   TSH 1.66 10/14/2011   INR 1.7 (A) 07/20/2018       BNP (last 3 results) No results for input(s): BNP in the last 8760 hours.  ProBNP (  last 3 results) No results for input(s): PROBNP in the last 8760 hours.   Other Studies Reviewed Today:  Holter 02/2018 Narrative & Impression    Sinus rhythm with first degree AV block, Right bundle branch block Average heart rate is 62 bpm Occasional premature ventricular contractions Frequent premature atrial contractions with nonsustained atrial tachycardia Nonsustained atrial fibrillation also observed Nocturnal bradycardia with mobitz I second degree AV block is noted transiently No sustained arrhythmias No prolonged pauses      Myoview Impression from March 2016 Exercise Capacity: Lexiscan with no exercise. BP Response: Normal blood pressure response. Clinical Symptoms: Nausea. ECG Impression: Atrial fibrillation, no changes from baseline.  Comparison with Prior Nuclear Study: No images to compare  Overall Impression: Low risk stress nuclear study a medium-sized basal inferior and basal to mid inferolateral perfusion defect that is actually worse at rest than with stress. No ischemia. Given normal wall motion, this may represent diaphragmatic attenuation. .  LV Ejection Fraction: 63%. LV Wall Motion: NL LV Function; NL Wall Motion   Loralie Champagne 11/07/2014   Echo Study Conclusions from February 2016  - Left ventricle: The cavity size was normal. Wall thickness was increased in a pattern of mild LVH. Systolic function was normal. The estimated ejection fraction was in the range of 60% to 65%. Wall motion was normal; there were no regional wall motion abnormalities. - Aortic valve: There was trivial  regurgitation. - Left atrium: The atrium was mildly dilated. - Right atrium: The atrium was mildly dilated.  Assessment/Plan:  1.PAF noted on prior Holter - his CHADSVASC is at least 28 (age, HTN, CAD)-he was initially placed on Eliquis - switched to Coumadin due to cost and then back to Eliquis due to marked lability. Doing well clinically. Has had his surveillance labs.    2.HTN -BP is ok - no changes made today.   3. ChronicDyspnea - He hadcardiac studiesback [id number] whichwere stable.He continues to participate in cardiac rehab. He looks to be holding his own to me.   4. PVCs - past ablation- remains on low dose Toprol.Not endorsed today  5. Obesity -weight stable - did not gain.   6. Depression - long standing issue-he has been more upbeat here lately and is so again today -  I do think the pulmonary rehab is helping.  7. HLD -on statin therapy. Labs from January noted.   8. CAD -no active symptoms. Continue with current regimen.   9. Chronic anticoagulation - has had surveillance labs from January noted by PCP - KPN is reviewed.   10. Upcoming eye lid surgery - ok to proceed - discussed with pharmacist - can hold Eliquis for 2 days prior if needed.    Current medicines are reviewed with the patient today.  The patient does not have concerns regarding medicines other than what has been noted above.  The following changes have been made:  See above.  Labs/ tests ordered today include:   No orders of the defined types were placed in this encounter.    Disposition:   FU with me in 4 months.    Patient is agreeable to this plan and will call if any problems develop in the interim.   SignedTruitt Merle, NP  11/11/2018 9:29 AM  [place] [address] [place], [place]  [address] Phone: [phone] Fax: [phone]

## 2018-11-11 NOTE — Patient Instructions (Addendum)
We will be checking the following labs today - NONE  Medication Instructions:    Continue with your current medicines.    If you need a refill on your cardiac medications before your next appointment, please call your pharmacy.     Testing/Procedures To Be Arranged:  N/A  Follow-Up:   See me in about 4 months.    At [facility], you and your health needs are our priority.  As part of our continuing mission to provide you with exceptional heart care, we have created designated Provider Care Teams.  These Care Teams include your primary Cardiologist (physician) and Advanced Practice Providers (APPs -  Physician Assistants and Nurse Practitioners) who all work together to provide you with the care you need, when you need it.  Special Instructions:  . The pharmacist has recommended holding the Eliquis for 2 days prior to your procedure for your eyes in May  Call the [place] office at [phone] if you have any questions, problems or concerns.

## 2018-11-12 ENCOUNTER — Telehealth: Payer: Self-pay | Admitting: Pulmonary Disease

## 2018-11-12 ENCOUNTER — Encounter (HOSPITAL_COMMUNITY)
Admission: RE | Admit: 2018-11-12 | Discharge: 2018-11-12 | Disposition: A | Discharge status: Home / Self Care | Payer: Self-pay | Source: Ambulatory Visit | Attending: Pulmonary Disease | Admitting: Pulmonary Disease

## 2018-11-12 DIAGNOSIS — G4733 Obstructive sleep apnea (adult) (pediatric): Secondary | ICD-10-CM

## 2018-11-12 NOTE — Telephone Encounter (Signed)
Dr. Ander Slade has reviewed the home sleep test this showed Mild osa.   Recommendations   Treatment options are CPAP with the settings auto 5 to 15.    Weight loss measures .   Advise against driving while sleepy & against medication with sedative side effects.    Make appointment for 3 months for compliance with download with Dr. Ander Slade.  Patient would like to start to start cpap nothing further is need at this time.

## 2018-11-17 ENCOUNTER — Encounter (HOSPITAL_COMMUNITY)
Admission: RE | Admit: 2018-11-17 | Discharge: 2018-11-17 | Disposition: A | Discharge status: Home / Self Care | Payer: Self-pay | Source: Ambulatory Visit | Attending: Pulmonary Disease | Admitting: Pulmonary Disease

## 2018-11-19 ENCOUNTER — Other Ambulatory Visit: Payer: Self-pay

## 2018-11-19 ENCOUNTER — Encounter (HOSPITAL_COMMUNITY)
Admission: RE | Admit: 2018-11-19 | Discharge: 2018-11-19 | Disposition: A | Discharge status: Home / Self Care | Payer: Self-pay | Source: Ambulatory Visit | Attending: Pulmonary Disease | Admitting: Pulmonary Disease

## 2018-11-23 ENCOUNTER — Telehealth (HOSPITAL_COMMUNITY): Payer: Self-pay

## 2018-11-24 ENCOUNTER — Encounter (HOSPITAL_COMMUNITY): Payer: Self-pay

## 2018-11-26 ENCOUNTER — Encounter (HOSPITAL_COMMUNITY): Payer: Self-pay

## 2018-12-01 ENCOUNTER — Telehealth (HOSPITAL_COMMUNITY): Payer: Self-pay

## 2018-12-01 ENCOUNTER — Encounter (HOSPITAL_COMMUNITY): Payer: Self-pay

## 2018-12-01 ENCOUNTER — Telehealth: Payer: Self-pay | Admitting: Pulmonary Disease

## 2018-12-01 DIAGNOSIS — G4733 Obstructive sleep apnea (adult) (pediatric): Secondary | ICD-10-CM

## 2018-12-01 NOTE — Telephone Encounter (Signed)
Yes. We can change the pressure to 5-20

## 2018-12-01 NOTE — Telephone Encounter (Signed)
Returned call to patient. He states that he can't catch his breath on new settings when he lays down. Pt would like pressure changed back to 5-20cm.

## 2018-12-02 NOTE — Telephone Encounter (Signed)
Called and spoke with patient he is aware that order has been placed. Nothing further needed.

## 2018-12-03 ENCOUNTER — Encounter (HOSPITAL_COMMUNITY): Payer: Self-pay

## 2018-12-08 ENCOUNTER — Encounter (HOSPITAL_COMMUNITY): Payer: Self-pay

## 2018-12-10 ENCOUNTER — Encounter (HOSPITAL_COMMUNITY): Payer: Self-pay

## 2018-12-10 ENCOUNTER — Telehealth: Payer: Self-pay | Admitting: Pulmonary Disease

## 2018-12-10 DIAGNOSIS — G4733 Obstructive sleep apnea (adult) (pediatric): Secondary | ICD-10-CM

## 2018-12-10 NOTE — Telephone Encounter (Signed)
Called and spoke with patient, patient stated that he recently had his settings changed on his BIPAP due to it being too high for him. He stated that now it is too low and he cannot use it. He cant breathe if it is too low.   AO please advise, thank you.

## 2018-12-11 NOTE — Telephone Encounter (Signed)
Yes. Thanks 

## 2018-12-11 NOTE — Telephone Encounter (Signed)
Download has been placed beside your computer. Thank you!!

## 2018-12-11 NOTE — Telephone Encounter (Signed)
Spoke with patient. He is aware of the new settings. Advised him that I would send the order to [facility] today. He verbalized understanding. Nothing further needed at time of call.

## 2018-12-11 NOTE — Telephone Encounter (Signed)
Patient has not used CPAP per report. Please change settings to auto set 7-20 cmH20.

## 2018-12-11 NOTE — Telephone Encounter (Signed)
Tonya, please advise if you are willing to review patient's download since AO is not here. If so, I have the download printed and ready to go.

## 2018-12-15 ENCOUNTER — Encounter (HOSPITAL_COMMUNITY): Payer: Self-pay

## 2018-12-17 ENCOUNTER — Encounter (HOSPITAL_COMMUNITY): Payer: Self-pay

## 2018-12-22 ENCOUNTER — Encounter (HOSPITAL_COMMUNITY): Payer: Self-pay

## 2018-12-22 ENCOUNTER — Encounter (HOSPITAL_COMMUNITY): Payer: Self-pay | Admitting: *Deleted

## 2018-12-22 ENCOUNTER — Telehealth: Payer: Self-pay | Admitting: Internal Medicine

## 2018-12-22 NOTE — Telephone Encounter (Signed)
Not sure what this means.   Can we get more information?

## 2018-12-22 NOTE — Telephone Encounter (Signed)
New Message   STAT if patient feels like he/she is going to faint   1) Are you dizzy now? NO  2) Do you feel faint or have you passed out? NO  3) Do you have any other symptoms? Sensation in the back of his neck.   4) Have you checked your HR and BP (record if available)? No

## 2018-12-22 NOTE — Progress Notes (Signed)
I was able to reach [name] today to notify him that the department is closed indefinitely due to the coronavirus. I informed him that we would contact him when we reopen. I discussed food security, medications and exercise. He states that he has no issues with food or his medications. He is walking on the treadmill. I encouraged him to continue.

## 2018-12-22 NOTE — Telephone Encounter (Signed)
S/w pt and pt's daughter pt has been dizzy and has sensation going across neck and shoulders, comes and goes but laying down makes it better.  Pt notice these symptoms since January when pt was put on abilify ( 5mg ) daily.  Pt denies, nausea, fever,chills, passing out.  Pt's bp this am was 71/33, had pt retake bp on phone 91/46  HR 57.  Advised with Cecille Rubin, Abilify can cause hypotension.Pt is to D/C abilify. Medication list updated.  Pt is to contact doctor who prescribed abilify to let dr know whats going on.    Pt is to hold this pm doses of cardura, metoprolol, and hydralazine.pt is to restart in the am.  Pt is to have a little salt. Pt is to take bp in the am and call office with update. Will send to [place] to [place].

## 2018-12-22 NOTE — Telephone Encounter (Signed)
Noted  

## 2018-12-23 NOTE — Telephone Encounter (Signed)
S/w pt has already taken am meds.  [name] was advised and stated to monitor bp and get in touch with Dr. Shon Baton office. Pt was reminded to d/c Abilify.

## 2018-12-23 NOTE — Telephone Encounter (Signed)
S/w pt stated feeling a little better today but wife was in the back saying he does not feel good and pt is lethargic.  bp reading as follows: Today  141/61  6:30 am              112/52  8:15 am On phone  112/52  HR 56 O2 sats 91  Pt still having tingling sensation in neck and wanted to know why he is having this.  Stated symptoms started with Abilify, this was just d/c yesterday, might resolve after medication is out of system. Will send to Cecille Rubin to advise and than will call pt back.

## 2018-12-23 NOTE — Telephone Encounter (Signed)
Follow up: ° ° ° °Patient returning call from yesterday. Please call patient back. °

## 2018-12-23 NOTE — Telephone Encounter (Signed)
BP is ok - ok to hold his cardiac meds today and continue to monitor the BP.   I am assuming the Abilify is the culprit for the tingling - this may take time to clear from his system.  I would like for him to touch base with Dr. Philip Aspen to get his recommendations as well.

## 2018-12-24 ENCOUNTER — Encounter (HOSPITAL_COMMUNITY): Payer: Self-pay

## 2018-12-28 ENCOUNTER — Telehealth: Payer: Self-pay | Admitting: Internal Medicine

## 2018-12-28 ENCOUNTER — Other Ambulatory Visit: Payer: Self-pay | Admitting: *Deleted

## 2018-12-28 MED ORDER — LISINOPRIL 40 MG PO TABS: 20.0000 mg | ORAL_TABLET | Freq: Every day | ORAL | 9 refills | Status: DC

## 2018-12-28 NOTE — Telephone Encounter (Signed)
S/w pt regarding hypotension, this has been ongoing the last several weeks.  Pt stated took bp this am at 8:15 88/49 and 9:45 102/49.  Pt did take all am meds. Had pt take bp while on phone 83/38  HR 54.  Advised with Cecille Rubin pt is to to half lisinopril (20 mg) daily, medication list updated.  Hold amlodipine (5mg  ) daily this week.  Had pt pull bottles and t/w wife to confirm instructions.  Pt stated did t/w [place] office last week after Cecille Rubin d/c Abilify due to pt having hypotension and tingling in neck and shoulders.  Dr. Shon Baton office stated did not think Abilify was causing any of theses symptoms.  Pt stated tingling has subsided. Pt will take bp twice daily and call back on Thursday with a list of bp readings and will advise with Cecille Rubin to see if other meds will have to be D/C'd. Will send to [place] to [place].

## 2018-12-28 NOTE — Telephone Encounter (Signed)
  Patient would like to speak to [facility] because his blood pressure is real low again. He states he has been speaking with her in regards to this recently. His BP is 102/49 right now but he states it has been lower than that.

## 2018-12-28 NOTE — Telephone Encounter (Signed)
Noted  

## 2018-12-29 ENCOUNTER — Encounter (HOSPITAL_COMMUNITY): Payer: Self-pay

## 2018-12-31 ENCOUNTER — Encounter (HOSPITAL_COMMUNITY): Payer: Self-pay

## 2018-12-31 NOTE — Telephone Encounter (Signed)
S/w pt is aware of Lori's recommendation's. D/C hydralazine.  Medication list updated.  Keep log of bp and consented to [facility]/Doximity appt April 28. Consent added to chart. Will send to [place] to [place].

## 2018-12-31 NOTE — Telephone Encounter (Signed)
Noted  

## 2018-12-31 NOTE — Telephone Encounter (Signed)
Per pt call stated he is returning this office call asked to speak to [facility] please give him a call back.

## 2018-12-31 NOTE — Telephone Encounter (Signed)
Virtual Visit Pre-Appointment Phone Call  "(Name), I am calling you today to discuss your upcoming appointment. We are currently trying to limit exposure to the virus that causes COVID-19 by seeing patients at home rather than in the office."  1. "What is the BEST phone number to call the day of the visit?" - include this in appointment notes  2. "Do you have or have access to (through a family member/friend) a smartphone with video capability that we can use for your visit?" a. If yes - list this number in appt notes as "cell" (if different from BEST phone #) and list the appointment type as a VIDEO visit in appointment notes b. If no - list the appointment type as a PHONE visit in appointment notes  3. Confirm consent - "In the setting of the current Covid19 crisis, you are scheduled for a (phone or video) visit with your provider on (Tuesday, April 28) at (11:00 am).  Just as we do with many in-office visits, in order for you to participate in this visit, we must obtain consent.  If you'd like, I can send this to your mychart (if signed up) or email for you to review.  Otherwise, I can obtain your verbal consent now.  All virtual visits are billed to your insurance company just like a normal visit would be.  By agreeing to a virtual visit, we'd like you to understand that the technology does not allow for your provider to perform an examination, and thus may limit your provider's ability to fully assess your condition. If your provider identifies any concerns that need to be evaluated in person, we will make arrangements to do so.  Finally, though the technology is pretty good, we cannot assure that it will always work on either your or our end, and in the setting of a video visit, we may have to convert it to a phone-only visit.  In either situation, we cannot ensure that we have a secure connection.  Are you willing to proceed?" STAFF: Did the patient verbally acknowledge consent to telehealth  visit? Document YES/NO here: Yes  4. Advise patient to be prepared - "Two hours prior to your appointment, go ahead and check your blood pressure, pulse, oxygen saturation, and your weight (if you have the equipment to check those) and write them all down. When your visit starts, your provider will ask you for this information. If you have an Apple Watch or Kardia device, please plan to have heart rate information ready on the day of your appointment. Please have a pen and paper handy nearby the day of the visit as well."  5. Give patient instructions for MyChart download to smartphone OR Doximity/Doxy.me as below if video visit (depending on what platform provider is using)  6. Inform patient they will receive a phone call 15 minutes prior to their appointment time (may be from unknown caller ID) so they should be prepared to answer    TELEPHONE CALL NOTE  TOMMY GOOSTREE has been deemed a candidate for a follow-up tele-health visit to limit community exposure during the Covid-19 pandemic. I spoke with the patient via phone to ensure availability of phone/video source, confirm preferred email & phone number, and discuss instructions and expectations.  I reminded VAISHNAV DEMARTIN to be prepared with any vital sign and/or heart rhythm information that could potentially be obtained via home monitoring, at the time of his visit. I reminded MAYS PAINO to expect a phone  call prior to his visit.  Karanveer Ramakrishnan Avanell Shackleton 12/31/2018 10:51 AM   INSTRUCTIONS FOR DOWNLOADING THE MYCHART APP TO SMARTPHONE  - The patient must first make sure to have activated MyChart and know their login information - If Apple, go to [employer] and type in MyChart in the search bar and download the app. If Android, ask patient to go to [employer] and type in [place] in the search bar and download the app. The app is free but as with any other app downloads, their phone may require them to verify saved payment  information or Apple/Android password.  - The patient will need to then log into the app with their MyChart username and password, and select [facility] as their healthcare provider to link the account. When it is time for your visit, go to the MyChart app, find appointments, and click Begin Video Visit. Be sure to Select Allow for your device to access the Microphone and Camera for your visit. You will then be connected, and your provider will be with you shortly.  **If they have any issues connecting, or need assistance please contact MyChart service desk [phone])**  **If using a computer, in order to ensure the best quality for their visit they will need to use either of the following Internet Browsers: [employer], or Google Chrome**  IF USING DOXIMITY or DOXY.ME - The patient will receive a link just prior to their visit by text.     FULL LENGTH CONSENT FOR TELE-HEALTH VISIT   I hereby voluntarily request, consent and authorize [facility] and its employed or contracted physicians, physician assistants, nurse practitioners or other licensed health care professionals (the Practitioner), to provide me with telemedicine health care services (the "Services") as deemed necessary by the treating Practitioner. I acknowledge and consent to receive the Services by the Practitioner via telemedicine. I understand that the telemedicine visit will involve communicating with the Practitioner through live audiovisual communication technology and the disclosure of certain medical information by electronic transmission. I acknowledge that I have been given the opportunity to request an in-person assessment or other available alternative prior to the telemedicine visit and am voluntarily participating in the telemedicine visit.  I understand that I have the right to withhold or withdraw my consent to the use of telemedicine in the course of my care at any time, without affecting my right  to future care or treatment, and that the Practitioner or I may terminate the telemedicine visit at any time. I understand that I have the right to inspect all information obtained and/or recorded in the course of the telemedicine visit and may receive copies of available information for a reasonable fee.  I understand that some of the potential risks of receiving the Services via telemedicine include:  Marland Kitchen Delay or interruption in medical evaluation due to technological equipment failure or disruption; . Information transmitted may not be sufficient (e.g. poor resolution of images) to allow for appropriate medical decision making by the Practitioner; and/or  . In rare instances, security protocols could fail, causing a breach of personal health information.  Furthermore, I acknowledge that it is my responsibility to provide information about my medical history, conditions and care that is complete and accurate to the best of my ability. I acknowledge that Practitioner's advice, recommendations, and/or decision may be based on factors not within their control, such as incomplete or inaccurate data provided by me or distortions of diagnostic images or specimens that may result from  electronic transmissions. I understand that the practice of medicine is not an exact science and that Practitioner makes no warranties or guarantees regarding treatment outcomes. I acknowledge that I will receive a copy of this consent concurrently upon execution via email to the email address I last provided but may also request a printed copy by calling the office of [facility].    I understand that my insurance will be billed for this visit.   I have read or had this consent read to me. . I understand the contents of this consent, which adequately explains the benefits and risks of the Services being provided via telemedicine.  . I have been provided ample opportunity to ask questions regarding this consent and the Services  and have had my questions answered to my satisfaction. . I give my informed consent for the services to be provided through the use of telemedicine in my medical care  By participating in this telemedicine visit I agree to the above.

## 2018-12-31 NOTE — Telephone Encounter (Signed)
S/w pt to get bp readings for the week due to medication changes. BP as follows:  4/20  121/40  HR  68 4/21  92/34  9:50 am           110/42  HR  68  5:50 pm 4/22  131/46  HR  52  9:25 am           105/41  HR  58  5:51 pm 4/23  115/54  HR  60           97/43  HR 60  Pt states feeling a lot better.  Wife got on phone stated pt can only walk two feet with holding on.  Pt does not exercise, stated got on treadmill the other day stated did not recommend this at this time.  Pt not drinking.   Will send to [place] to [place].

## 2019-01-04 NOTE — Progress Notes (Signed)
Telehealth Visit     Virtual Visit via Video Note   This visit type was conducted due to national recommendations for restrictions regarding the COVID-19 Pandemic (e.g. social distancing) in an effort to limit this patient's exposure and mitigate transmission in our community.  Due to his co-morbid illnesses, this patient is at least at moderate risk for complications without adequate follow up.  This format is felt to be most appropriate for this patient at this time.  All issues noted in this document were discussed and addressed.  A limited physical exam was performed with this format.  Please refer to the patient's chart for his consent to telehealth for [facility].   Evaluation Performed:  Follow-up visit  This visit type was conducted due to national recommendations for restrictions regarding the COVID-19 Pandemic (e.g. social distancing).  This format is felt to be most appropriate for this patient at this time.  All issues noted in this document were discussed and addressed.  No physical exam was performed (except for noted visual exam findings with Video Visits).  Please refer to the patient's chart (MyChart message for video visits and phone note for telephone visits) for the patient's consent to telehealth for [facility].  Date:  01/05/2019   ID:  [name], DOB [birth year], MRN [id number]  Patient Location:  Home  Provider location:   Home  PCP:  Drew Battles, MD  Cardiologist:  [name]:  [name]  Chief Complaint:  Work in it for low BP  History of Present Illness:    [name] is a 85 y.o. male who presents via audio/video conferencing for a telehealth visit today.  Seen for Dr. Rayann [name]. He has seen Dr. Doreatha [name] in the remote past.Primarily follows with me.  He has a h/o PVC ablation 05/29/10, PAF, obesity, sedentary lifestyle, HLD, gout, OSA treated with CPAP, &dysfunctional diaphragm with chronic  dyspneapreviouslyfollowed by Dr. Gwenette [name] - now with Dr. Lake [name]. He has mild CAD per cath back in August of 2011. Labs are checked by primary care.   Seen back in February of 2016 by Dr. Rayann [name] - this was for evaluation of dyspnea. Dr. Gwenette [name] wanted cardiac status reevaluated to assess for any cardiac issues that may be contributing to dyspnea. Was referred for Myoview and echocardiogram. These studies were satisfactory. Encouraged to cut back on his alcohol intake as well.   I have followed him since - he has been holding his own - dyspnea is chronic.He has gotten back with pulmonary - now seeing Dr. Lake [name].Balance issues and has had some falls.Has ended up having documented AF - now on anticoagulation - initially with Eliquis - but then transitioned to Coumadin due to cost and then back to Eliquis. Has also needed some regulation of his BP medicines. Last seen in November and he was doing well - had lost a few more pounds of weight. Was transitioned back to Eliquis due to labile INRs.   I saw him about a month ago - he was doing pretty well. Had been placed on Abilify. Cardiac status felt to be ok.   He has had called several times here recently over the past 1 to 2 weeks. Has had tingling in his back and shoulders ever since being placed on Abilify. BP was running low - we have cut his ACE in half, stopped his Norvasc and just recently stopped Hydralazine as well. Unclear why BP has been lower. His tingling has resolved. He has said that he  felt ok but wife has disagreed.   The patient does not have symptoms concerning for COVID-19 infection (fever, chills, cough, or new shortness of breath).   Seen today via Doximity video off his daughter's phone - [name]. He has consented for this visit. Wife and daughter both augment the history - he says he feels good. They do not agree. Wife says he looks "old and pasty". He did get outside and walk a few steps yesterday. Not eating/drinking well. He is  not dizzy - but then says sometimes he feels dizzy - maybe 3 or 4 days ago. But now better.  No falls. No syncope. Maybe little slower/confused according to family. Adamant that he has not fallen. The tingling that he had seemed to be more noticeable when his BP was low. They are checking multiple times thru out the course of the day - still basically low. He has lost weight. He admits he is depressed. He is on a multitude of agents. Son in [place] has had massive stroke recently as well. No fever, cough, chills. His breathing is unchanged. He has had no sick contacts.    Past Medical History:  Diagnosis Date  . BPH (benign prostatic hyperplasia)   . Coronary artery ectasia    Mild CAD with normal systolic function per cath in August of 2011  . Diaphragmatic paralysis    felt to be partially responsible for dyspnea  . Dyspnea    due to paralyzed diaphragm and pulmonary issues  . Gout   . HTN (hypertension)   . Hypercholesteremia   . Microhematuria   . Obesity   . OSA (obstructive sleep apnea)   . Paroxysmal atrial fibrillation (HCC)    occured in the setting of acute E Coli sepsis and ileius 7/12  . PVC (premature ventricular contraction)    s/p PVC ablation 05/29/2010   Past Surgical History:  Procedure Laterality Date  . APPENDECTOMY  1978  . CARDIAC CATHETERIZATION  04-30-2010   Left main coronary artery is normal.   . CARDIOVASCULAR STRESS TEST  03-19-2010   0%  . CHOLECYSTECTOMY  1990  . poly removed from nose as a child    . PVC ablation  05/29/2010  . US ECHOCARDIOGRAPHY  03-09-2010   EF 55-60%     Current Meds  Medication Sig  . allopurinol (ZYLOPRIM) 300 MG tablet Take 300 mg by mouth daily.    Marland Kitchen amLODipine (NORVASC) 5 MG tablet Take 5 mg by mouth daily.  Marland Kitchen apixaban (ELIQUIS) 5 MG TABS tablet Take 1 tablet (5 mg total) by mouth 2 (two) times daily.  Marland Kitchen buPROPion (WELLBUTRIN SR) 100 MG 12 hr tablet Take 100 mg by mouth 2 (two) times daily.  Marland Kitchen doxazosin (CARDURA) 4 MG  tablet Take 1 tablet (4 mg total) by mouth daily.  Marland Kitchen lisinopril (ZESTRIL) 40 MG tablet Take 0.5 tablets (20 mg total) by mouth daily.  Marland Kitchen lovastatin (MEVACOR) 40 MG tablet Take 40 mg by mouth at bedtime.    . metoprolol succinate (TOPROL-XL) 25 MG 24 hr tablet Take 25 mg by mouth 2 (two) times daily.   . OXYGEN Inhale 2 L into the lungs at bedtime.  . sertraline (ZOLOFT) 100 MG tablet Take 100 mg by mouth daily.  . traZODone (DESYREL) 100 MG tablet Take 100 mg by mouth at bedtime.       Allergies:   Patient has no known allergies.   Social History   Tobacco Use  . Smoking status: Former Smoker  Packs/day: 3.00    Years: 20.00    Pack years: 60.00    Types: Cigarettes    Last attempt to quit: 09/09/1984    Years since quitting: 34.3  . Smokeless tobacco: Never Used  Substance Use Topics  . Alcohol use: Yes    Alcohol/week: 0.0 standard drinks    Comment: 1 glass of wine per week  . Drug use: No     Family Hx: The patient's family history includes Aneurysm (age of onset: 25) in his father; Breast cancer in his daughter and mother.  ROS:   Please see the history of present illness.   All other systems reviewed are negative.    Objective:    Vital Signs:  BP (!) 96/46   Pulse 61   Ht 5\' 10"  (1.778 m)   Wt 199 lb 12.8 oz (90.6 kg)   BMI 28.67 kg/m    Wt Readings from Last 3 Encounters:  01/05/19 199 lb 12.8 oz (90.6 kg)  11/11/18 211 lb (95.7 kg)  10/07/18 211 lb 12.8 oz (96.1 kg)    Alert male in no acute distress. Not short of breath with conversation. Appropriate responses but flat affect. He seems depressed to me. Color may be off - hard to say in the video. His weight is down 12 pounds.    Labs/Other Tests and Data Reviewed:    Lab Results  Component Value Date   WBC 5.9 04/15/2018   HGB 12.5 (L) 04/15/2018   HCT 37.6 04/15/2018   PLT 173 04/15/2018   GLUCOSE 96 04/15/2018   ALT 28 04/05/2011   AST 32 04/05/2011   NA 141 04/15/2018   K 4.9 04/15/2018    CL 104 04/15/2018   CREATININE 1.21 04/15/2018   BUN 38 (H) 04/15/2018   CO2 22 04/15/2018   TSH 1.66 10/14/2011   INR 1.7 (A) 07/20/2018       BNP (last 3 results) No results for input(s): BNP in the last 8760 hours.  ProBNP (last 3 results) No results for input(s): PROBNP in the last 8760 hours.    Prior CV studies:    The following studies were reviewed today:   Holter 02/2018 Narrative & Impression    Sinus rhythm with first degree AV block, Right bundle branch block Average heart rate is 62 bpm Occasional premature ventricular contractions Frequent premature atrial contractions with nonsustained atrial tachycardia Nonsustained atrial fibrillation also observed Nocturnal bradycardia with mobitz I second degree AV block is noted transiently No sustained arrhythmias No prolonged pauses      Myoview Impression from March 2016 Exercise Capacity: Lexiscan with no exercise. BP Response: Normal blood pressure response. Clinical Symptoms: Nausea. ECG Impression: Atrial fibrillation, no changes from baseline.  Comparison with Prior Nuclear Study: No images to compare  Overall Impression: Low risk stress nuclear study a medium-sized basal inferior and basal to mid inferolateral perfusion defect that is actually worse at rest than with stress. No ischemia. Given normal wall motion, this may represent diaphragmatic attenuation. .  LV Ejection Fraction: 63%. LV Wall Motion: NL LV Function; NL Wall Motion   Loralie Champagne 11/07/2014   Echo Study Conclusions from February 2016  - Left ventricle: The cavity size was normal. Wall thickness was increased in a pattern of mild LVH. Systolic function was normal. The estimated ejection fraction was in the range of 60% to 65%. Wall motion was normal; there were no regional wall motion abnormalities. - Aortic valve: There was trivial regurgitation. - Left atrium:  The atrium was mildly dilated. - Right  atrium: The atrium was mildly dilated.     ASSESSMENT & PLAN:     1.HTN - but with lower BP here recently - we have cut/stopped several medicines - not really clear to me as to why BP is now low - remains concerning for some other underlying cause - I am sending him to the lab. Check UA as well. Stopping ACE altogether - he has taken today. They are to check BP just 3 times a day. Further disposition to follow.   2. Tingling of neck/shoulders - may have been related to his Abilify - ? Related to his hypotension - this started only when this medicine was started.   3. PAFnoted onpriorHolter - his CHADSVASC is at least 34 (age, HTN, CAD)-he was initially placed on Eliquis - switched to Coumadin due to cost and then back to Eliquis due to marked lability. No bleeding reported in stool or urine. Denies any falls.   4. ChronicDyspnea - He hadcardiac studiesback [id number] whichwere stable.He continues to participate in cardiac rehab. He looks to be holding his own to me.   5. PVCs - past ablation- remains on low dose Toprol.Not endorsed today  6. Obesity -weight is down 12 pounds - this may be driving some of the issues.   6. Depression - long standing issue-I suspect this is the crux of his issues.   7. HLD -on statin therapy. Not discussed today.   8. CAD -no active chest pain but with hypotension. See above.   9. Chronic anticoagulation - checking labs today. No bleeding reported. No falls noted.   10. COVID-19 Education: The signs and symptoms of COVID-19 were discussed with the patient and how to seek care for testing (follow up with PCP or arrange E-visit).  The importance of social distancing, staying at home and hand hygiene were discussed today.  Patient Risk:   After full review of this patient's clinical status, I feel that they are at least moderate risk at this time.  Time:   Today, I have spent 15 minutes with the patient with telehealth technology  discussing the above issues.     Medication Adjustments/Labs and Tests Ordered: Current medicines are reviewed at length with the patient today.  Concerns regarding medicines are outlined above.   Tests Ordered: No orders of the defined types were placed in this encounter.   Medication Changes: No orders of the defined types were placed in this encounter.   Disposition:  Further disposition to follow. Will see what his lab show. Stopping ACE. They will monitor BP.   Patient is agreeable to this plan and will call if any problems develop in the interim.   Amie Critchley, NP  01/05/2019 11:14 AM    [place]

## 2019-01-05 ENCOUNTER — Telehealth: Payer: Self-pay | Admitting: Nurse Practitioner

## 2019-01-05 ENCOUNTER — Telehealth (INDEPENDENT_AMBULATORY_CARE_PROVIDER_SITE_OTHER): Payer: Medicare Other | Admitting: Nurse Practitioner

## 2019-01-05 ENCOUNTER — Other Ambulatory Visit: Payer: Medicare Other

## 2019-01-05 ENCOUNTER — Encounter: Payer: Self-pay | Admitting: Nurse Practitioner

## 2019-01-05 ENCOUNTER — Emergency Department (HOSPITAL_COMMUNITY): Payer: Medicare Other

## 2019-01-05 ENCOUNTER — Encounter (HOSPITAL_COMMUNITY): Payer: Self-pay

## 2019-01-05 ENCOUNTER — Observation Stay (HOSPITAL_COMMUNITY): Payer: Medicare Other

## 2019-01-05 ENCOUNTER — Other Ambulatory Visit: Payer: Self-pay

## 2019-01-05 ENCOUNTER — Inpatient Hospital Stay (HOSPITAL_COMMUNITY)
Admission: EM | Admit: 2019-01-05 | Discharge: 2019-01-07 | DRG: 812 | Disposition: A | Discharge status: Home / Self Care | Payer: Medicare Other | Attending: Internal Medicine | Admitting: Internal Medicine

## 2019-01-05 VITALS — BP 96/46 | HR 61 | Ht 70.0 in | Wt 199.8 lb

## 2019-01-05 DIAGNOSIS — E785 Hyperlipidemia, unspecified: Secondary | ICD-10-CM | POA: Diagnosis present

## 2019-01-05 DIAGNOSIS — Z79899 Other long term (current) drug therapy: Secondary | ICD-10-CM

## 2019-01-05 DIAGNOSIS — I491 Atrial premature depolarization: Secondary | ICD-10-CM | POA: Diagnosis present

## 2019-01-05 DIAGNOSIS — I4891 Unspecified atrial fibrillation: Secondary | ICD-10-CM | POA: Diagnosis not present

## 2019-01-05 DIAGNOSIS — Z87891 Personal history of nicotine dependence: Secondary | ICD-10-CM

## 2019-01-05 DIAGNOSIS — N179 Acute kidney failure, unspecified: Secondary | ICD-10-CM | POA: Diagnosis present

## 2019-01-05 DIAGNOSIS — R0602 Shortness of breath: Secondary | ICD-10-CM

## 2019-01-05 DIAGNOSIS — I959 Hypotension, unspecified: Secondary | ICD-10-CM

## 2019-01-05 DIAGNOSIS — E669 Obesity, unspecified: Secondary | ICD-10-CM | POA: Diagnosis present

## 2019-01-05 DIAGNOSIS — G4733 Obstructive sleep apnea (adult) (pediatric): Secondary | ICD-10-CM | POA: Diagnosis present

## 2019-01-05 DIAGNOSIS — D649 Anemia, unspecified: Secondary | ICD-10-CM | POA: Diagnosis present

## 2019-01-05 DIAGNOSIS — J986 Disorders of diaphragm: Secondary | ICD-10-CM | POA: Diagnosis present

## 2019-01-05 DIAGNOSIS — I951 Orthostatic hypotension: Secondary | ICD-10-CM | POA: Diagnosis present

## 2019-01-05 DIAGNOSIS — I10 Essential (primary) hypertension: Secondary | ICD-10-CM

## 2019-01-05 DIAGNOSIS — I480 Paroxysmal atrial fibrillation: Secondary | ICD-10-CM | POA: Diagnosis present

## 2019-01-05 DIAGNOSIS — J9611 Chronic respiratory failure with hypoxia: Secondary | ICD-10-CM | POA: Diagnosis present

## 2019-01-05 DIAGNOSIS — I471 Supraventricular tachycardia: Secondary | ICD-10-CM | POA: Diagnosis present

## 2019-01-05 DIAGNOSIS — N400 Enlarged prostate without lower urinary tract symptoms: Secondary | ICD-10-CM | POA: Diagnosis present

## 2019-01-05 DIAGNOSIS — R55 Syncope and collapse: Secondary | ICD-10-CM | POA: Diagnosis not present

## 2019-01-05 DIAGNOSIS — Z9049 Acquired absence of other specified parts of digestive tract: Secondary | ICD-10-CM

## 2019-01-05 DIAGNOSIS — E861 Hypovolemia: Secondary | ICD-10-CM | POA: Diagnosis present

## 2019-01-05 DIAGNOSIS — M109 Gout, unspecified: Secondary | ICD-10-CM | POA: Diagnosis present

## 2019-01-05 DIAGNOSIS — J439 Emphysema, unspecified: Secondary | ICD-10-CM | POA: Diagnosis present

## 2019-01-05 DIAGNOSIS — I259 Chronic ischemic heart disease, unspecified: Secondary | ICD-10-CM

## 2019-01-05 DIAGNOSIS — Z6828 Body mass index (BMI) 28.0-28.9, adult: Secondary | ICD-10-CM

## 2019-01-05 DIAGNOSIS — F329 Major depressive disorder, single episode, unspecified: Secondary | ICD-10-CM | POA: Diagnosis present

## 2019-01-05 DIAGNOSIS — I4510 Unspecified right bundle-branch block: Secondary | ICD-10-CM | POA: Diagnosis present

## 2019-01-05 DIAGNOSIS — D500 Iron deficiency anemia secondary to blood loss (chronic): Principal | ICD-10-CM | POA: Diagnosis present

## 2019-01-05 DIAGNOSIS — Z7901 Long term (current) use of anticoagulants: Secondary | ICD-10-CM

## 2019-01-05 DIAGNOSIS — Z9981 Dependence on supplemental oxygen: Secondary | ICD-10-CM

## 2019-01-05 DIAGNOSIS — J432 Centrilobular emphysema: Secondary | ICD-10-CM

## 2019-01-05 DIAGNOSIS — I441 Atrioventricular block, second degree: Secondary | ICD-10-CM | POA: Diagnosis present

## 2019-01-05 DIAGNOSIS — I2510 Atherosclerotic heart disease of native coronary artery without angina pectoris: Secondary | ICD-10-CM | POA: Diagnosis present

## 2019-01-05 DIAGNOSIS — Z7189 Other specified counseling: Secondary | ICD-10-CM

## 2019-01-05 LAB — IRON AND TIBC
Iron: 32 ug/dL — ABNORMAL LOW (ref 45–182)
Saturation Ratios: 7 % — ABNORMAL LOW (ref 17.9–39.5)
TIBC: 454 ug/dL — ABNORMAL HIGH (ref 250–450)
UIBC: 422 ug/dL

## 2019-01-05 LAB — URINALYSIS, ROUTINE W REFLEX MICROSCOPIC
Bilirubin Urine: NEGATIVE
Glucose, UA: NEGATIVE mg/dL
Hgb urine dipstick: NEGATIVE
Ketones, ur: NEGATIVE mg/dL
Leukocytes,Ua: NEGATIVE
Nitrite: NEGATIVE
Protein, ur: NEGATIVE mg/dL
Specific Gravity, Urine: 1.01 (ref 1.005–1.030)
pH: 5 (ref 5.0–8.0)

## 2019-01-05 LAB — COMPREHENSIVE METABOLIC PANEL
ALT: 16 U/L (ref 0–44)
AST: 21 U/L (ref 15–41)
Albumin: 4 g/dL (ref 3.5–5.0)
Alkaline Phosphatase: 40 U/L (ref 38–126)
Anion gap: 8 (ref 5–15)
BUN: 47 mg/dL — ABNORMAL HIGH (ref 8–23)
CO2: 21 mmol/L — ABNORMAL LOW (ref 22–32)
Calcium: 8.7 mg/dL — ABNORMAL LOW (ref 8.9–10.3)
Chloride: 108 mmol/L (ref 98–111)
Creatinine, Ser: 1.57 mg/dL — ABNORMAL HIGH (ref 0.61–1.24)
GFR calc Af Amer: 47 mL/min — ABNORMAL LOW (ref >60)
GFR calc non Af Amer: 40 mL/min — ABNORMAL LOW (ref >60)
Glucose, Bld: 119 mg/dL — ABNORMAL HIGH (ref 70–99)
Potassium: 4.5 mmol/L (ref 3.5–5.1)
Sodium: 137 mmol/L (ref 135–145)
Total Bilirubin: 0.3 mg/dL (ref 0.3–1.2)
Total Protein: 6.4 g/dL — ABNORMAL LOW (ref 6.5–8.1)

## 2019-01-05 LAB — CBC
HCT: 27.4 % — ABNORMAL LOW (ref 39.0–52.0)
Hemoglobin: 8.2 g/dL — ABNORMAL LOW (ref 13.0–17.0)
MCH: 26.9 pg (ref 26.0–34.0)
MCHC: 29.9 g/dL — ABNORMAL LOW (ref 30.0–36.0)
MCV: 89.8 fL (ref 80.0–100.0)
Platelets: 232 10*3/uL (ref 150–400)
RBC: 3.05 MIL/uL — ABNORMAL LOW (ref 4.22–5.81)
RDW: 17.5 % — ABNORMAL HIGH (ref 11.5–15.5)
WBC: 7.1 10*3/uL (ref 4.0–10.5)
nRBC: 0 % (ref 0.0–0.2)

## 2019-01-05 LAB — TYPE AND SCREEN
ABO/RH(D): O POS
Antibody Screen: NEGATIVE

## 2019-01-05 LAB — RETICULOCYTES
Immature Retic Fract: 29.2 % — ABNORMAL HIGH (ref 2.3–15.9)
RBC.: 3.16 MIL/uL — ABNORMAL LOW (ref 4.22–5.81)
Retic Count, Absolute: 32.5 10*3/uL (ref 19.0–186.0)
Retic Ct Pct: 1 % (ref 0.4–3.1)

## 2019-01-05 LAB — ETHANOL: Alcohol, Ethyl (B): <10 mg/dL (ref <10)

## 2019-01-05 LAB — FERRITIN: Ferritin: 9 ng/mL — ABNORMAL LOW (ref 24–336)

## 2019-01-05 LAB — VITAMIN B12: Vitamin B-12: 193 pg/mL (ref 180–914)

## 2019-01-05 LAB — DIFFERENTIAL
Abs Immature Granulocytes: 0.03 10*3/uL (ref 0.00–0.07)
Basophils Absolute: 0 10*3/uL (ref 0.0–0.1)
Basophils Relative: 0 %
Eosinophils Absolute: 0.1 10*3/uL (ref 0.0–0.5)
Eosinophils Relative: 1 %
Immature Granulocytes: 0 %
Lymphocytes Relative: 24 %
Lymphs Abs: 1.7 10*3/uL (ref 0.7–4.0)
Monocytes Absolute: 0.6 10*3/uL (ref 0.1–1.0)
Monocytes Relative: 9 %
Neutro Abs: 4.7 10*3/uL (ref 1.7–7.7)
Neutrophils Relative %: 66 %

## 2019-01-05 LAB — APTT: aPTT: 36 seconds (ref 24–36)

## 2019-01-05 LAB — PROTIME-INR
INR: 1.3 — ABNORMAL HIGH (ref 0.8–1.2)
Prothrombin Time: 15.6 seconds — ABNORMAL HIGH (ref 11.4–15.2)

## 2019-01-05 LAB — ABO/RH: ABO/RH(D): O POS

## 2019-01-05 LAB — FOLATE: Folate: 9.6 ng/mL (ref >5.9)

## 2019-01-05 MED ORDER — ALLOPURINOL 300 MG PO TABS
300.0000 mg | ORAL_TABLET | Freq: Every day | ORAL | Status: DC
  Administered 2019-01-06 – 2019-01-07 (×2): 300 mg via ORAL
  Filled 2019-01-05 (×2): qty 1

## 2019-01-05 MED ORDER — SODIUM CHLORIDE 0.9 % IV BOLUS
500.0000 mL | Freq: Once | INTRAVENOUS | Status: AC
  Administered 2019-01-05: 500 mL via INTRAVENOUS

## 2019-01-05 MED ORDER — BUPROPION HCL ER (SR) 100 MG PO TB12
100.0000 mg | ORAL_TABLET | Freq: Two times a day (BID) | ORAL | Status: DC
  Administered 2019-01-06 – 2019-01-07 (×3): 100 mg via ORAL
  Filled 2019-01-05 (×4): qty 1

## 2019-01-05 MED ORDER — METOPROLOL SUCCINATE ER 25 MG PO TB24: 25.0000 mg | ORAL_TABLET | Freq: Two times a day (BID) | ORAL | Status: DC

## 2019-01-05 MED ORDER — SERTRALINE HCL 100 MG PO TABS
100.0000 mg | ORAL_TABLET | Freq: Every day | ORAL | Status: DC
  Administered 2019-01-06 – 2019-01-07 (×2): 100 mg via ORAL
  Filled 2019-01-05 (×2): qty 1

## 2019-01-05 MED ORDER — PRAVASTATIN SODIUM 40 MG PO TABS
40.0000 mg | ORAL_TABLET | Freq: Every day | ORAL | Status: DC
  Administered 2019-01-06: 40 mg via ORAL
  Filled 2019-01-05: qty 1

## 2019-01-05 MED ORDER — TRAZODONE HCL 50 MG PO TABS
100.0000 mg | ORAL_TABLET | Freq: Every day | ORAL | Status: DC
  Administered 2019-01-05 – 2019-01-06 (×2): 100 mg via ORAL
  Filled 2019-01-05 (×2): qty 2

## 2019-01-05 MED ORDER — SODIUM CHLORIDE 0.9% FLUSH
3.0000 mL | Freq: Two times a day (BID) | INTRAVENOUS | Status: DC
  Administered 2019-01-05 – 2019-01-07 (×4): 3 mL via INTRAVENOUS

## 2019-01-05 MED ORDER — DOXAZOSIN MESYLATE 4 MG PO TABS
4.0000 mg | ORAL_TABLET | Freq: Every day | ORAL | Status: DC
  Administered 2019-01-06 – 2019-01-07 (×2): 4 mg via ORAL
  Filled 2019-01-05 (×2): qty 1

## 2019-01-05 NOTE — ED Triage Notes (Signed)
Pt had hesitation taking a step. Daughter and Doctor request evaluation.

## 2019-01-05 NOTE — ED Notes (Signed)
Patient transported to X-ray 

## 2019-01-05 NOTE — ED Provider Notes (Signed)
[name] EMERGENCY DEPARTMENT Provider Note   CSN: [id number] Arrival date & time: 01/05/19  1711    History   Chief Complaint Chief Complaint  Patient presents with   Medical Clearance    daughter/ doctor requests eval    HPI [name] is a 85 y.o. male.     Patient with history of COPD on home oxygen at night, OSA, depression, paroxysmal A. fib on Eliquis --presents to the emergency department today with transient confusion and low blood pressures.  Much of the history obtained through the patient's daughter, [name], and through discussion and evaluation in epic with cardiology today.  Patient has been having spells of low blood pressure at home over the past several weeks.  Patient has had his blood pressure medications decreased recently however this does not seem to help the spells.  Patient had a telemedicine visit with cardiology NP today.  Lab work was ordered.  Per family, patient was able to go out and take a walk today without any difficulty and went to have his blood drawn.  While returning home the patient was getting out of the car and stood up.  He became confused and zoned out for approximately 3 minutes.  He was not looking at family or anything in particular and was unresponsive during this time.  He eventually came back to it and was able to step up on the curb and continue going into the house.  Patient had a similar instance after eating and standing up from his table at home 3 days ago.  After the incidence today, his blood pressure was found to be 81/33.  Family reports that he has been having some balance issues over the past month or so but denies any instances of slurred speech, facial droop, unilateral weakness.  They contacted their cardiologist again who encouraged him to come to the emergency department for CT imaging.  Patient's breathing seems to be at baseline and he has not complained of any abdominal pain.  No recent nausea, vomiting,  or diarrhea.  Patient did return to his baseline after the incident today.     Past Medical History:  Diagnosis Date   BPH (benign prostatic hyperplasia)    Coronary artery ectasia    Mild CAD with normal systolic function per cath in August of 2011   Diaphragmatic paralysis    felt to be partially responsible for dyspnea   Dyspnea    due to paralyzed diaphragm and pulmonary issues   Gout    HTN (hypertension)    Hypercholesteremia    Microhematuria    Obesity    OSA (obstructive sleep apnea)    Paroxysmal atrial fibrillation ([place])    occured in the setting of acute E Coli sepsis and ileius 7/12   PVC (premature ventricular contraction)    s/p PVC ablation 05/29/2010    Patient Active Problem List   Diagnosis Date Noted   Chronic respiratory failure with hypoxia ([place]) 06/30/2018   OSA (obstructive sleep apnea) 02/11/2018   Physical deconditioning 08/11/2017   Shortness of breath 10/24/2014   COPD (chronic obstructive pulmonary disease) with emphysema ([place]) 09/01/2014   Fatigue 10/14/2011   Atrial fibrillation ([place]) 05/15/2011   Premature ventricular contractions (PVCs) (VPCs) 02/25/2011   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    CARDIOVASCULAR STRESS TEST  03-19-2010   0%   CHOLECYSTECTOMY  1990   poly removed from nose as a child     PVC ablation  05/29/2010   US ECHOCARDIOGRAPHY  03-09-2010   EF 55-60%        Home Medications    Prior to Admission medications   Medication Sig Start Date End Date Taking? Authorizing Provider  allopurinol  (ZYLOPRIM) 300 MG tablet Take 300 mg by mouth daily.      [provider]  apixaban (ELIQUIS) 5 MG TABS tablet Take 1 tablet (5 mg total) by mouth 2 (two) times daily. 07/22/18   Burtis Junes, NP  buPROPion (WELLBUTRIN SR) 100 MG 12 hr tablet Take 100 mg by mouth 2 (two) times daily.    [provider]  doxazosin (CARDURA) 4 MG tablet Take 1 tablet (4 mg total) by mouth daily. 02/11/13   [name], Jeneen Rinks, MD  lovastatin (MEVACOR) 40 MG tablet Take 40 mg by mouth at bedtime.      [provider]  metoprolol succinate (TOPROL-XL) 25 MG 24 hr tablet Take 25 mg by mouth 2 (two) times daily.  05/15/11   [name], Jeneen Rinks, MD  OXYGEN Inhale 2 L into the lungs at bedtime.    [provider]  sertraline (ZOLOFT) 100 MG tablet Take 100 mg by mouth daily. 09/14/14   [provider]  traZODone (DESYREL) 100 MG tablet Take 100 mg by mouth at bedtime.      [provider]    Family History Family History  Problem Relation Age of Onset   Aneurysm Father 74       brain aneurysm   Breast cancer Mother    Breast cancer Daughter     Social History Social History   Tobacco Use   Smoking status: Former Smoker    Packs/day: 3.00    Years: 20.00    Pack years: 60.00    Types: Cigarettes    Last attempt to quit: 09/09/1984    Years since quitting: 34.3   Smokeless tobacco: Never Used  Substance Use Topics   Alcohol use: Yes    Alcohol/week: 0.0 standard drinks    Comment: 1 glass of wine per week   Drug use: No     Allergies   Patient has no known allergies.   Review of Systems Review of Systems  Constitutional: Negative for fever.  HENT: Negative for rhinorrhea and sore throat.   Eyes: Negative for redness.  Respiratory: Positive for shortness of breath. Negative for cough.   Cardiovascular: Negative for chest pain.  Gastrointestinal: Negative for abdominal pain, diarrhea, nausea and vomiting.  Genitourinary: Negative for dysuria.   Musculoskeletal: Positive for gait problem. Negative for myalgias.  Skin: Negative for rash.  Neurological: Negative for seizures, speech difficulty, light-headedness and headaches.  Psychiatric/Behavioral: Positive for confusion.     Physical Exam Updated Vital Signs BP 127/64 (BP Location: Right Arm)    Pulse 83    Temp 97.6 F (36.4 C) (Oral)    Resp 18    Ht 5\' 10"  (1.778 m)    Wt 90.6 kg    SpO2 92%    BMI 28.67 kg/m   Physical Exam Vitals signs and nursing note reviewed.  Constitutional:      Appearance: He is well-developed.  HENT:  Head: Normocephalic and atraumatic.     Right Ear: Tympanic membrane, ear canal and external ear normal.     Left Ear: Tympanic membrane, ear canal and external ear normal.     Nose: Nose normal.     Mouth/Throat:     Pharynx: Uvula midline.  Eyes:     General: Lids are normal.     Conjunctiva/sclera: Conjunctivae normal.     Pupils: Pupils are equal, round, and reactive to light.  Neck:     Musculoskeletal: Normal range of motion and neck supple.  Cardiovascular:     Rate and Rhythm: Normal rate and regular rhythm.  Pulmonary:     Effort: Pulmonary effort is normal.     Breath sounds: Normal breath sounds.  Abdominal:     Palpations: Abdomen is soft.     Tenderness: There is no abdominal tenderness.  Genitourinary:    Prostate: Enlarged. Not tender.     Rectum: External hemorrhoid (non-bleeding) present. No mass or tenderness.  Musculoskeletal: Normal range of motion.     Cervical back: He exhibits normal range of motion, no tenderness and no bony tenderness.  Skin:    General: Skin is warm and dry.  Neurological:     Mental Status: He is alert and oriented to person, place, and time.     GCS: GCS eye subscore is 4. GCS verbal subscore is 5. GCS motor subscore is 6.     Cranial Nerves: No cranial nerve deficit.     Sensory: No sensory deficit.     Motor: No abnormal muscle tone.     Coordination: Coordination normal.      Gait: Gait normal.     Deep Tendon Reflexes: Reflexes are normal and symmetric.      ED Treatments / Results  Labs (all labs ordered are listed, but only abnormal results are displayed) Labs Reviewed  PROTIME-INR - Abnormal; Notable for the following components:      Result Value   Prothrombin Time 15.6 (*)    INR 1.3 (*)    All other components within normal limits  CBC - Abnormal; Notable for the following components:   RBC 3.05 (*)    Hemoglobin 8.2 (*)    HCT 27.4 (*)    MCHC 29.9 (*)    RDW 17.5 (*)    All other components within normal limits  COMPREHENSIVE METABOLIC PANEL - Abnormal; Notable for the following components:   CO2 21 (*)    Glucose, Bld 119 (*)    BUN 47 (*)    Creatinine, Ser 1.57 (*)    Calcium 8.7 (*)    Total Protein 6.4 (*)    GFR calc non Af Amer 40 (*)    GFR calc Af Amer 47 (*)    All other components within normal limits  URINALYSIS, ROUTINE W REFLEX MICROSCOPIC - Abnormal; Notable for the following components:   Color, Urine STRAW (*)    All other components within normal limits  ETHANOL  APTT  DIFFERENTIAL  TYPE AND SCREEN    EKG EKG Interpretation  Date/Time:  Tuesday January 05 2019 17:22:22 EDT Ventricular Rate:  81 PR Interval:    QRS Duration: 158 QT Interval:  421 QTC Calculation: 489 R Axis:   42 Text Interpretation:  Sinus rhythm Atrial premature complex Prolonged PR interval Right bundle branch block Confirmed by Malvin Johns [phone]) on 01/05/2019 6:12:31 PM   Radiology Ct Head Wo Contrast  Result Date: 01/05/2019 CLINICAL DATA:  TIA initial  exam EXAM: CT HEAD WITHOUT CONTRAST TECHNIQUE: Contiguous axial images were obtained from the base of the skull through the vertex without intravenous contrast. COMPARISON:  None. FINDINGS: Brain: Mild atrophy. Negative for hydrocephalus. Negative for acute infarct, hemorrhage, or mass lesion. Vascular: Negative for hyperdense vessel Skull: Negative Sinuses/Orbits: Paranasal sinuses  clear. Bilateral cataract surgery. No orbital lesion. Other: None IMPRESSION: No acute abnormality. Electronically Signed   By: Franchot Gallo M.D.   On: 01/05/2019 18:49    Procedures Procedures (including critical care time)  Medications Ordered in ED Medications  sodium chloride 0.9 % bolus 500 mL (has no administration in time range)     Initial Impression / Assessment and Plan / ED Course  I have reviewed the triage vital signs and the nursing notes.  Pertinent labs & imaging results that were available during my care of the patient were reviewed by me and considered in my medical decision making (see chart for details).        Patient seen and examined. Patient with O2 sat in the 70's upon arrival to room. No respiratory distress. He was placed on 2L [place] and improved into the low 90's.   Vital signs reviewed and are as follows: BP 127/64 (BP Location: Right Arm)    Pulse 83    Temp 97.6 F (36.4 C) (Oral)    Resp 18    Ht 5\' 10"  (1.778 m)    Wt 90.6 kg    SpO2 92%    BMI 28.67 kg/m   I had a good discussion with patient's daughter [name] by telephone. Will keep them updated. Patient is going to CT now. (6:08 PM)  7:18 PM CT reviewed by myself.  No abnormalities noted.  Patient found to have mild AK I as well as new anemia.  I performed rectal exam with nurse tech chaperone.  Patient did not have any stool in the rectum.  Nonbleeding hemorrhoids noted on exam.  Unable to run Hemoccult due to lack of sample at this time.  I updated patient's wife and daughter who are in agreement with admission.  Will discuss with hospitalist.  7:51 PM Spoke with Dr. Alcario Drought who will see.   Final Clinical Impressions(s) / ED Diagnoses   Final diagnoses:  Anemia, unspecified type  Hypotension, unspecified hypotension type   Admit for further work-up of the above problems.   ED Discharge Orders    None       Carlisle Cater, Hershal Coria 01/05/19 1952    Malvin Johns, MD 01/05/19  [phone]

## 2019-01-05 NOTE — Plan of Care (Signed)

## 2019-01-05 NOTE — Progress Notes (Signed)
Patient ID: [name], male   DOB: [birth year], 85 y.o.   MRN: [id number]

## 2019-01-05 NOTE — Progress Notes (Addendum)
   Subjective:    Patient ID: [name], male    DOB: 07-14-1934, 85 y.o.   MRN: [id number]  HPI    Review of Systems     Objective:   Physical Exam        Assessment & Plan:

## 2019-01-05 NOTE — Patient Instructions (Addendum)
After Visit Summary:  We will be checking the following labs today - BMET, CBC, HPF, TSh, Mag level, UA and urine culture  If you have labs (blood work) drawn today and your tests are completely normal, you will receive your results only by: Marland Kitchen MyChart Message (if you have MyChart) OR . A paper copy in the mail If you have any lab test that is abnormal or we need to change your treatment, we will call you to review the results.   Medication Instructions:    Continue with your current medicines.  I am stopping the Lisinopril     If you need a refill on your cardiac medications before your next appointment, please call your pharmacy.     Testing/Procedures To Be Arranged:  N/A  Follow-Up:   Let's see what the lab show.     At [facility], you and your health needs are our priority.  As part of our continuing mission to provide you with exceptional heart care, we have created designated Provider Care Teams.  These Care Teams include your primary Cardiologist (physician) and Advanced Practice Providers (APPs -  Physician Assistants and Nurse Practitioners) who all work together to provide you with the care you need, when you need it.  Special Instructions:  . Stay safe, stay home and wash your hands for at least 20 seconds! . It was good to see you today.  Marland Kitchen Keep monitoring your BP for me - three times a day - morning/afternoon/night . Try to drink that bottle of water every day and walk some each day.   Call the [facility] office at [phone] if you have any questions, problems or concerns.

## 2019-01-05 NOTE — Progress Notes (Signed)
[name] came in today 01/05/2019 for labs.  He was unable to provide a urine specimen.  I sent him home with a container to bring back.  Damin Salido [name]

## 2019-01-05 NOTE — Plan of Care (Signed)
  Problem: Education: Goal: Knowledge of General Education information will improve Description Including pain rating scale, medication(s)/side effects and non-pharmacologic comfort measures 01/05/2019 2231 by Orvilla Fus, RN Outcome: Progressing 01/05/2019 2230 by Orvilla Fus, RN Outcome: Progressing   Problem: Health Behavior/Discharge Planning: Goal: Ability to manage health-related needs will improve 01/05/2019 2231 by Orvilla Fus, RN Outcome: Progressing 01/05/2019 2230 by Orvilla Fus, RN Outcome: Progressing   Problem: Clinical Measurements: Goal: Ability to maintain clinical measurements within normal limits will improve 01/05/2019 2231 by Orvilla Fus, RN Outcome: Progressing 01/05/2019 2230 by Orvilla Fus, RN Outcome: Progressing Goal: Will remain free from infection 01/05/2019 2231 by Orvilla Fus, RN Outcome: Progressing 01/05/2019 2230 by Orvilla Fus, RN Outcome: Progressing Goal: Diagnostic test results will improve 01/05/2019 2231 by Orvilla Fus, RN Outcome: Progressing 01/05/2019 2230 by Orvilla Fus, RN Outcome: Progressing Goal: Respiratory complications will improve 01/05/2019 2231 by Orvilla Fus, RN Outcome: Progressing 01/05/2019 2230 by Orvilla Fus, RN Outcome: Progressing Goal: Cardiovascular complication will be avoided 01/05/2019 2231 by Orvilla Fus, RN Outcome: Progressing 01/05/2019 2230 by Orvilla Fus, RN Outcome: Progressing   Problem: Activity: Goal: Risk for activity intolerance will decrease 01/05/2019 2231 by Orvilla Fus, RN Outcome: Progressing 01/05/2019 2230 by Orvilla Fus, RN Outcome: Progressing   Problem: Nutrition: Goal: Adequate nutrition will be maintained 01/05/2019 2231 by Orvilla Fus, RN Outcome: Progressing 01/05/2019 2230 by Orvilla Fus, RN Outcome: Progressing   Problem: Coping: Goal: Level of anxiety will decrease 01/05/2019 2231 by Orvilla Fus, RN Outcome: Progressing 01/05/2019 2230 by Orvilla Fus, RN Outcome: Progressing   Problem: Elimination: Goal: Will not experience complications related to bowel motility 01/05/2019 2231 by Orvilla Fus, RN Outcome: Progressing 01/05/2019 2230 by Orvilla Fus, RN Outcome: Progressing Goal: Will not experience complications related to urinary retention 01/05/2019 2231 by Orvilla Fus, RN Outcome: Progressing 01/05/2019 2230 by Orvilla Fus, RN Outcome: Progressing   Problem: Pain Managment: Goal: General experience of comfort will improve 01/05/2019 2231 by Orvilla Fus, RN Outcome: Progressing 01/05/2019 2230 by Orvilla Fus, RN Outcome: Progressing   Problem: Safety: Goal: Ability to remain free from injury will improve 01/05/2019 2231 by Orvilla Fus, RN Outcome: Progressing 01/05/2019 2230 by Orvilla Fus, RN Outcome: Progressing   Problem: Skin Integrity: Goal: Risk for impaired skin integrity will decrease 01/05/2019 2231 by Orvilla Fus, RN Outcome: Progressing 01/05/2019 2230 by Orvilla Fus, RN Outcome: Progressing   Problem: Education: Goal: Ability to demonstrate management of disease process will improve 01/05/2019 2231 by Orvilla Fus, RN Outcome: Progressing 01/05/2019 2230 by Orvilla Fus, RN Outcome: Progressing Goal: Ability to verbalize understanding of medication therapies will improve 01/05/2019 2231 by Orvilla Fus, RN Outcome: Progressing 01/05/2019 2230 by Orvilla Fus, RN Outcome: Progressing Goal: Individualized Educational Video(s) 01/05/2019 2231 by Orvilla Fus, RN Outcome: Progressing 01/05/2019 2230 by Orvilla Fus, RN Outcome: Progressing   Problem: Activity: Goal: Capacity to carry out activities will improve 01/05/2019 2231 by Orvilla Fus, RN Outcome: Progressing 01/05/2019 2230 by Orvilla Fus, RN Outcome: Progressing   Problem: Cardiac: Goal: Ability to  achieve and maintain adequate cardiopulmonary perfusion will improve 01/05/2019 2231 by Orvilla Fus, RN Outcome: Progressing 01/05/2019 2230 by Orvilla Fus, RN Outcome: Progressing

## 2019-01-05 NOTE — Telephone Encounter (Signed)
S/w daughter pt states pt had two episodes where pt could not move and was very confused. Also had a episode Saturday. BP was taken  After episode bp was 81/30.  Pt went to office today to get labs and UA drawn.  After speaking with Truitt Merle, NP advised pt needs to go to [facility] and get a brain scan.  Pt is on blood thinner.  Pt has had hypotension the past couple of weeks and medications have been adjusted accordingly. Will send to [place] to [place];.

## 2019-01-05 NOTE — Addendum Note (Signed)
Addended by: Marciano Sequin on: 01/05/2019 02:39 PM   Modules accepted: Orders

## 2019-01-05 NOTE — Telephone Encounter (Signed)
New Message ° ° ° °Pt is returning call  ° ° ° °Please call back  °

## 2019-01-05 NOTE — H&P (Signed)
History and Physical    [name] VPX:[id number] DOB: July 09, 1934 DOA: 01/05/2019  PCP: [name], MD  Patient coming from: Home  I have personally briefly reviewed patient's old medical records in [place]  Chief Complaint: episodes of AMS  HPI: [name] is a 85 y.o. male with medical history significant of COPD / diaphragmatic paralysis, OSA, O2 and CPAP at night but not on O2 during day, PAF on eliquis.  Patient presents to the ED today with transient confusion episodes.  Per Daughter Bonnita Nasuti and cardiology evaluation today: Patient has been having spells of low blood pressure at home over the past several weeks.  Patient has had his blood pressure medications decreased recently however this does not seem to help the spells.  Patient had a telemedicine visit with cardiology NP today.  Lab work was ordered.  Per family, patient was able to go out and take a walk today without any difficulty and went to have his blood drawn.  While returning home, however, he got out of car, stood up, became confused and "zoned out" for about 3 mins, was basically unresponsive during this time.  Eventually came back to it, was able to step up on curb and continue going into house.  Had similar event after eating and then standing up from table x3 days ago.  After today's event, daughter took BP and it was 81/33.  Family reports that he has been having some balance issues over the past month or so but denies any instances of slurred speech, facial droop, unilateral weakness.  Only BP meds at this time are metoprolol for PAF rate control and cardura for BPH.   ED Course: In the ED, orthostatic vitals only showed drop from 462 systolic to 703 systolic.  On the other hand, he has new anemia with HGB 8.2 down from 12.x in Aug last year.  He also was noted to be satting in the 70s on room air after he got back to the room.  Denies any melena or hematochezia.  Hemoccult unable to  be performed due to no stool in rectal vault.   Review of Systems: As per HPI otherwise 10 point review of systems negative.   Past Medical History:  Diagnosis Date  . BPH (benign prostatic hyperplasia)   . Coronary artery ectasia    Mild CAD with normal systolic function per cath in August of 2011  . Diaphragmatic paralysis    felt to be partially responsible for dyspnea  . Dyspnea    due to paralyzed diaphragm and pulmonary issues  . Gout   . HTN (hypertension)   . Hypercholesteremia   . Microhematuria   . Obesity   . OSA (obstructive sleep apnea)   . Paroxysmal atrial fibrillation (HCC)    occured in the setting of acute E Coli sepsis and ileius 7/12  . PVC (premature ventricular contraction)    s/p PVC ablation 05/29/2010    Past Surgical History:  Procedure Laterality Date  . APPENDECTOMY  1978  . CARDIAC CATHETERIZATION  04-30-2010   Left main coronary artery is normal.   . CARDIOVASCULAR STRESS TEST  03-19-2010   0%  . CHOLECYSTECTOMY  1990  . poly removed from nose as a child    . PVC ablation  05/29/2010  . US ECHOCARDIOGRAPHY  03-09-2010   EF 55-60%     reports that he quit smoking about 34 years ago. His smoking use included cigarettes. He has a 60.00 pack-year smoking  history. He has never used smokeless tobacco. He reports current alcohol use. He reports that he does not use drugs.  No Known Allergies  Family History  Problem Relation Age of Onset  . Aneurysm Father 55       brain aneurysm  . Breast cancer Mother   . Breast cancer Daughter      Prior to Admission medications   Medication Sig Start Date End Date Taking? Authorizing Provider  allopurinol (ZYLOPRIM) 300 MG tablet Take 300 mg by mouth daily.      [provider]  apixaban (ELIQUIS) 5 MG TABS tablet Take 1 tablet (5 mg total) by mouth 2 (two) times daily. 07/22/18   Burtis Junes, NP  buPROPion (WELLBUTRIN SR) 100 MG 12 hr tablet Take 100 mg by mouth 2 (two) times daily.     [provider]  doxazosin (CARDURA) 4 MG tablet Take 1 tablet (4 mg total) by mouth daily. 02/11/13   [name], Jeneen Rinks, MD  lovastatin (MEVACOR) 40 MG tablet Take 40 mg by mouth at bedtime.      [provider]  metoprolol succinate (TOPROL-XL) 25 MG 24 hr tablet Take 25 mg by mouth 2 (two) times daily.  05/15/11   [name], Jeneen Rinks, MD  OXYGEN Inhale 2 L into the lungs at bedtime.    [provider]  sertraline (ZOLOFT) 100 MG tablet Take 100 mg by mouth daily. 09/14/14   [provider]  traZODone (DESYREL) 100 MG tablet Take 100 mg by mouth at bedtime.      [provider]    Physical Exam: Vitals:   01/05/19 1723 01/05/19 1730 01/05/19 1737 01/05/19 1900  BP:  (!) 125/59  (!) 148/71  Pulse:  79  80  Resp:  (!) 21  20  Temp:      TempSrc:      SpO2: 92% 95%  100%  Weight:   90.6 kg   Height:   5\' 10"  (1.778 m)     Constitutional: NAD, calm, comfortable Eyes: PERRL, lids and conjunctivae normal ENMT: Mucous membranes are moist. Posterior pharynx clear of any exudate or lesions.Normal dentition.  Neck: normal, supple, no masses, no thyromegaly Respiratory: clear to auscultation bilaterally, no wheezing, no crackles. Normal respiratory effort. No accessory muscle use.  Cardiovascular: Regular rate and rhythm, no murmurs / rubs / gallops. No extremity edema. 2+ pedal pulses. No carotid bruits.  Abdomen: no tenderness, no masses palpated. No hepatosplenomegaly. Bowel sounds positive.  Musculoskeletal: no clubbing / cyanosis. No joint deformity upper and lower extremities. Good ROM, no contractures. Normal muscle tone.  Skin: no rashes, lesions, ulcers. No induration Neurologic: CN 2-12 grossly intact. Sensation intact, DTR normal. Strength 5/5 in all 4.  Psychiatric: Normal judgment and insight. Alert and oriented x 3. Normal mood.    Labs on Admission: I have personally reviewed following labs and imaging studies  CBC: Recent Labs  Lab 01/05/19  1730  WBC 7.1  NEUTROABS 4.7  HGB 8.2*  HCT 27.4*  MCV 89.8  PLT 378   Basic Metabolic Panel: Recent Labs  Lab 01/05/19 1730  NA 137  K 4.5  CL 108  CO2 21*  GLUCOSE 119*  BUN 47*  CREATININE 1.57*  CALCIUM 8.7*   GFR: Estimated Creatinine Clearance: 40.3 mL/min (A) (by C-G formula based on SCr of 1.57 mg/dL (H)). Liver Function Tests: Recent Labs  Lab 01/05/19 1730  AST 21  ALT 16  ALKPHOS 40  BILITOT 0.3  PROT 6.4*  ALBUMIN 4.0   No results for input(s): LIPASE, AMYLASE in the last 168 hours. No results for input(s): AMMONIA in the last 168 hours. Coagulation Profile: Recent Labs  Lab 01/05/19 1730  INR 1.3*   Cardiac Enzymes: No results for input(s): CKTOTAL, CKMB, CKMBINDEX, TROPONINI in the last 168 hours. BNP (last 3 results) No results for input(s): PROBNP in the last 8760 hours. HbA1C: No results for input(s): HGBA1C in the last 72 hours. CBG: No results for input(s): GLUCAP in the last 168 hours. Lipid Profile: No results for input(s): CHOL, HDL, LDLCALC, TRIG, CHOLHDL, LDLDIRECT in the last 72 hours. Thyroid Function Tests: No results for input(s): TSH, T4TOTAL, FREET4, T3FREE, THYROIDAB in the last 72 hours. Anemia Panel: No results for input(s): VITAMINB12, FOLATE, FERRITIN, TIBC, IRON, RETICCTPCT in the last 72 hours. Urine analysis:    Component Value Date/Time   COLORURINE STRAW (A) 01/05/2019 1804   APPEARANCEUR CLEAR 01/05/2019 1804   LABSPEC 1.010 01/05/2019 1804   PHURINE 5.0 01/05/2019 1804   GLUCOSEU NEGATIVE 01/05/2019 1804   HGBUR NEGATIVE 01/05/2019 1804   BILIRUBINUR NEGATIVE 01/05/2019 1804   KETONESUR NEGATIVE 01/05/2019 1804   PROTEINUR NEGATIVE 01/05/2019 1804   UROBILINOGEN 1.0 04/03/2011 0150   NITRITE NEGATIVE 01/05/2019 1804   LEUKOCYTESUR NEGATIVE 01/05/2019 1804    Radiological Exams on Admission: X-ray Chest Pa And Lateral  Result Date: 01/05/2019 CLINICAL DATA:  Chronic shortness of breath, history  atrial fibrillation, hypertension, former smoker EXAM: CHEST - 2 VIEW COMPARISON:  07/23/2017 FINDINGS: Upper normal heart size. Mediastinal contours and pulmonary vascularity normal. Bibasilar atelectasis. Upper lungs clear. No acute infiltrate, pleural effusion or pneumothorax. Bones demineralized. IMPRESSION: Bibasilar atelectasis. Electronically Signed   By: Lavonia Dana M.D.   On: 01/05/2019 20:34   Ct Head Wo Contrast  Result Date: 01/05/2019 CLINICAL DATA:  TIA initial exam EXAM: CT HEAD WITHOUT CONTRAST TECHNIQUE: Contiguous axial images were obtained from the base of the skull through the vertex without intravenous contrast. COMPARISON:  None. FINDINGS: Brain: Mild atrophy. Negative for hydrocephalus. Negative for acute infarct, hemorrhage, or mass lesion. Vascular: Negative for hyperdense vessel Skull: Negative Sinuses/Orbits: Paranasal sinuses clear. Bilateral cataract surgery. No orbital lesion. Other: None IMPRESSION: No acute abnormality. Electronically Signed   By: Franchot Gallo M.D.   On: 01/05/2019 18:49    EKG: Independently reviewed.  Assessment/Plan Principal Problem:   Near syncope Active Problems:   Essential hypertension   Disorder of diaphragm   Atrial fibrillation (HCC)   COPD (chronic obstructive pulmonary disease) with emphysema (HCC)   Chronic respiratory failure with hypoxia (HCC)   Orthostatic hypotension   Normocytic normochromic anemia    1. Near syncope / AMS events - 1. 3 Immediate causes jump out as likely / needing further work up: 1. Hypoxia 2. Possible orthostatic hypotension 3. Anemia 2. Syncope pathway 3. 2d echo 4. Tele monitor 5. See below regarding the above 3 work up 2. Hypoxia - 1. CXR ordered and pending 2. But suspect this is chronic if present given that his SOB is reported at baseline. 3. Ambulate patient on room air and see if he is desating.  If he is desating to the 70s, he needs to be on O2 at all times (or at least with  activity), not just at night. 4. Cont pulse ox 3. Anemia - 1. Normocytic normochromic anemia, suspicious for intermittent or slow GIB; doesn't sound like fast GIB. 2. Anemia pnl 3. Repeat CBC in AM 4. Hemoccult with next BM 5.  Hold eliquis 6. Call GI in AM 4. Possible orthostatic hypotension - 1. Labile BPs, wernt able to replicate this on orthostatic vitals in ED 2. Maybe needs tilt table testing? 3. Put in message to P. Trent for cards eval in AM 5. PAF - 1. Cont metoprolol 2. Hold Eliquis 6. BPH - Cont Cardura 7. OSA - cont home CPAP  DVT prophylaxis: SCDs Code Status: Full Family Communication: Spoke with daughter on phone who is going to try to convince patient to stay the night for obs Disposition Plan: Home after admit Consults called: Message sent to P. Trent for cards eval in AM, call GI in AM Admission status: Place in [place], [place] Hospitalists  How to contact the [facility] Attending or Consulting provider [place] or covering provider during after hours [place], for this patient?  1. Check the care team in [facility] and look for a) attending/consulting [facility] provider listed and b) the [facility] team listed 2. Log into [email or web address]  Amion Physician Scheduling and messaging for groups and whole hospitals  On call and physician scheduling software for group practices, residents, hospitalists and other medical providers for call, clinic, rotation and shift schedules. OnCall [employer] is a hospital-wide system for scheduling doctors and paging doctors on call. EasyPlot is for scientific plotting and data analysis.  [email or web address]  and use [place]'s universal password to access. If you do not have the password, please contact the hospital operator.  3. Locate the [facility] provider you are looking for under [facility] and page to a number that you can be directly reached. 4. If you still have difficulty reaching the provider, please page the [facility] (Director on Call) for  the Hospitalists listed on amion for assistance.  01/05/2019, 8:39 PM

## 2019-01-05 NOTE — ED Notes (Addendum)
Nurse Navigator checked with pt.  Offered to call family to update them.  Gave them his room number and phone number to reach both the department as well as his room number.

## 2019-01-05 NOTE — Telephone Encounter (Signed)
Agree with need for CT scanning.

## 2019-01-05 NOTE — Telephone Encounter (Signed)
° ° °  Patient's daughter [name] calling to report the patient had an episode of confusion and BP was 81/3?Marland Kitchen Patient now feels better. She does not know the current BP  Please call [name] [phone].

## 2019-01-05 NOTE — ED Notes (Signed)
ED TO INPATIENT HANDOFF REPORT  ED Nurse Name and Phone #: Keilany Burnette [phone]  S Name/Age/Gender [name] 85 y.o. male Room/Bed: 026C/026C  Code Status   Code Status: Full Code  Home/SNF/Other Home Patient oriented to: self, place, time and situation Is this baseline? Yes   Triage Complete: Triage complete  Chief Complaint sent by dr  Triage Note Pt had hesitation taking a step. Daughter and Doctor request evaluation.    Allergies No Known Allergies  Level of Care/Admitting Diagnosis ED Disposition    ED Disposition Condition Comment   Admit  Hospital Area: [facility] [[id number]]  Level of Care: Telemetry Cardiac [103]  I expect the patient will be discharged within 24 hours: Yes  LOW acuity---Tx typically complete <24 hrs---ACUTE conditions typically can be evaluated <24 hours---LABS likely to return to acceptable levels <24 hours---IS near functional baseline---EXPECTED to return to current living arrangement---NOT newly hypoxic: Meets criteria for 5C-Observation unit  Covid Evaluation: N/A  Diagnosis: Near syncope [440347]  Admitting Physician: Etta Quill [[id number]]  Attending Physician: Etta Quill [[id number]]  PT Class (Do Not Modify): Observation [104]  PT Acc Code (Do Not Modify): Observation [10022]       B Medical/Surgery History Past Medical History:  Diagnosis Date  . BPH (benign prostatic hyperplasia)   . Coronary artery ectasia    Mild CAD with normal systolic function per cath in August of 2011  . Diaphragmatic paralysis    felt to be partially responsible for dyspnea  . Dyspnea    due to paralyzed diaphragm and pulmonary issues  . Gout   . HTN (hypertension)   . Hypercholesteremia   . Microhematuria   . Obesity   . OSA (obstructive sleep apnea)   . Paroxysmal atrial fibrillation (HCC)    occured in the setting of acute E Coli sepsis and ileius 7/12  . PVC (premature ventricular contraction)    s/p PVC  ablation 05/29/2010   Past Surgical History:  Procedure Laterality Date  . APPENDECTOMY  1978  . CARDIAC CATHETERIZATION  04-30-2010   Left main coronary artery is normal.   . CARDIOVASCULAR STRESS TEST  03-19-2010   0%  . CHOLECYSTECTOMY  1990  . poly removed from nose as a child    . PVC ablation  05/29/2010  . US ECHOCARDIOGRAPHY  03-09-2010   EF 55-60%     A IV Location/Drains/Wounds Patient Lines/Drains/Airways Status   Active Line/Drains/Airways    Name:   Placement date:   Placement time:   Site:   Days:   Peripheral IV 01/05/19 Right Wrist   01/05/19    1802    Wrist   less than 1          Intake/Output Last 24 hours No intake or output data in the 24 hours ending 01/05/19 1959  Labs/Imaging Results for orders placed or performed during the hospital encounter of 01/05/19 (from the past 48 hour(s))  Protime-INR     Status: Abnormal   Collection Time: 01/05/19  5:30 PM  Result Value Ref Range   Prothrombin Time 15.6 (H) 11.4 - 15.2 seconds   INR 1.3 (H) 0.8 - 1.2    Comment: (NOTE) INR goal varies based on device and disease states. Performed at [facility], [place] [address] [place], [place] [address]   APTT     Status: None   Collection Time: 01/05/19  5:30 PM  Result Value Ref Range   aPTT 36 24 -  36 seconds    Comment: Performed at [facility], [place] [address], [place], [place] [address]  CBC     Status: Abnormal   Collection Time: 01/05/19  5:30 PM  Result Value Ref Range   WBC 7.1 4.0 - 10.5 K/uL   RBC 3.05 (L) 4.22 - 5.81 MIL/uL   Hemoglobin 8.2 (L) 13.0 - 17.0 g/dL   HCT 27.4 (L) 39.0 - 52.0 %   MCV 89.8 80.0 - 100.0 fL   MCH 26.9 26.0 - 34.0 pg   MCHC 29.9 (L) 30.0 - 36.0 g/dL   RDW 17.5 (H) 11.5 - 15.5 %   Platelets 232 150 - 400 K/uL   nRBC 0.0 0.0 - 0.2 %    Comment: Performed at [facility] [address], [place], [place] [address]  Differential     Status: None   Collection Time: 01/05/19  5:30 PM  Result Value Ref Range    Neutrophils Relative % 66 %   Neutro Abs 4.7 1.7 - 7.7 K/uL   Lymphocytes Relative 24 %   Lymphs Abs 1.7 0.7 - 4.0 K/uL   Monocytes Relative 9 %   Monocytes Absolute 0.6 0.1 - 1.0 K/uL   Eosinophils Relative 1 %   Eosinophils Absolute 0.1 0.0 - 0.5 K/uL   Basophils Relative 0 %   Basophils Absolute 0.0 0.0 - 0.1 K/uL   Immature Granulocytes 0 %   Abs Immature Granulocytes 0.03 0.00 - 0.07 K/uL    Comment: Performed at [facility] [address], [place], [place] [address]  Comprehensive metabolic panel     Status: Abnormal   Collection Time: 01/05/19  5:30 PM  Result Value Ref Range   Sodium 137 135 - 145 mmol/L   Potassium 4.5 3.5 - 5.1 mmol/L   Chloride 108 98 - 111 mmol/L   CO2 21 (L) 22 - 32 mmol/L   Glucose, Bld 119 (H) 70 - 99 mg/dL   BUN 47 (H) 8 - 23 mg/dL   Creatinine, Ser 1.57 (H) 0.61 - 1.24 mg/dL   Calcium 8.7 (L) 8.9 - 10.3 mg/dL   Total Protein 6.4 (L) 6.5 - 8.1 g/dL   Albumin 4.0 3.5 - 5.0 g/dL   AST 21 15 - 41 U/L   ALT 16 0 - 44 U/L   Alkaline Phosphatase 40 38 - 126 U/L   Total Bilirubin 0.3 0.3 - 1.2 mg/dL   GFR calc non Af Amer 40 (L) >60 mL/min   GFR calc Af Amer 47 (L) >60 mL/min   Anion gap 8 5 - 15    Comment: Performed at [facility] [address] [place], [place] [address]  Ethanol     Status: None   Collection Time: 01/05/19  5:55 PM  Result Value Ref Range   Alcohol, Ethyl (B) <10 <10 mg/dL    Comment: (NOTE) Lowest detectable limit for serum alcohol is 10 mg/dL. For medical purposes only. Performed at [facility], [place] [address] [place], [place] [address]   Urinalysis, Routine w reflex microscopic     Status: Abnormal   Collection Time: 01/05/19  6:04 PM  Result Value Ref Range   Color, Urine STRAW (A) YELLOW   APPearance CLEAR CLEAR   Specific Gravity, Urine 1.010 1.005 - 1.030   pH 5.0 5.0 - 8.0   Glucose, UA NEGATIVE NEGATIVE mg/dL   Hgb urine dipstick NEGATIVE NEGATIVE   Bilirubin Urine NEGATIVE  NEGATIVE  Ketones, ur NEGATIVE NEGATIVE mg/dL   Protein, ur NEGATIVE NEGATIVE mg/dL   Nitrite NEGATIVE NEGATIVE   Leukocytes,Ua NEGATIVE NEGATIVE    Comment: Performed at [facility] [address] [place], [place] [address]   Ct Head Wo Contrast  Result Date: 01/05/2019 CLINICAL DATA:  TIA initial exam EXAM: CT HEAD WITHOUT CONTRAST TECHNIQUE: Contiguous axial images were obtained from the base of the skull through the vertex without intravenous contrast. COMPARISON:  None. FINDINGS: Brain: Mild atrophy. Negative for hydrocephalus. Negative for acute infarct, hemorrhage, or mass lesion. Vascular: Negative for hyperdense vessel Skull: Negative Sinuses/Orbits: Paranasal sinuses clear. Bilateral cataract surgery. No orbital lesion. Other: None IMPRESSION: No acute abnormality. Electronically Signed   By: Franchot Gallo M.D.   On: 01/05/2019 18:49    Pending Labs Unresulted Labs (From admission, onward)    Start     Ordered   01/06/19 0500  CBC  Tomorrow morning,   R     01/05/19 1955   01/06/19 9937  Basic metabolic panel  Tomorrow morning,   R     01/05/19 1955   01/05/19 1833  Type and screen  Once,   R     01/05/19 1832   Unscheduled  Occult blood card to lab, stool  As needed,   R     01/05/19 1936          Vitals/Pain Today's Vitals   01/05/19 1723 01/05/19 1730 01/05/19 1737 01/05/19 1900  BP:  (!) 125/59  (!) 148/71  Pulse:  79  80  Resp:  (!) 21  20  Temp:      TempSrc:      SpO2: 92% 95%  100%  Weight:   90.6 kg   Height:   5\' 10"  (1.778 m)   PainSc:   0-No pain     Isolation Precautions No active isolations  Medications Medications  sodium chloride 0.9 % bolus 500 mL (has no administration in time range)  sodium chloride flush (NS) 0.9 % injection 3 mL (has no administration in time range)  doxazosin (CARDURA) tablet 4 mg (has no administration in time range)  metoprolol succinate (TOPROL-XL) 24 hr tablet 25 mg (has no administration in time range)   pravastatin (PRAVACHOL) tablet 40 mg (has no administration in time range)  buPROPion (WELLBUTRIN SR) 12 hr tablet 100 mg (has no administration in time range)  allopurinol (ZYLOPRIM) tablet 300 mg (has no administration in time range)  sertraline (ZOLOFT) tablet 100 mg (has no administration in time range)  traZODone (DESYREL) tablet 100 mg (has no administration in time range)    Mobility walks with device High fall risk   Focused Assessments    R Recommendations: See Admitting Provider Note  Report given to:   Additional Notes:

## 2019-01-06 ENCOUNTER — Inpatient Hospital Stay (HOSPITAL_COMMUNITY): Payer: Medicare Other

## 2019-01-06 ENCOUNTER — Ambulatory Visit: Payer: Medicare Other | Admitting: Nurse Practitioner

## 2019-01-06 ENCOUNTER — Ambulatory Visit: Payer: Medicare Other | Admitting: Pulmonary Disease

## 2019-01-06 ENCOUNTER — Observation Stay (HOSPITAL_COMMUNITY): Payer: Medicare Other

## 2019-01-06 DIAGNOSIS — Z9981 Dependence on supplemental oxygen: Secondary | ICD-10-CM | POA: Diagnosis not present

## 2019-01-06 DIAGNOSIS — J439 Emphysema, unspecified: Secondary | ICD-10-CM | POA: Diagnosis present

## 2019-01-06 DIAGNOSIS — I441 Atrioventricular block, second degree: Secondary | ICD-10-CM | POA: Diagnosis present

## 2019-01-06 DIAGNOSIS — I951 Orthostatic hypotension: Secondary | ICD-10-CM | POA: Diagnosis present

## 2019-01-06 DIAGNOSIS — N179 Acute kidney failure, unspecified: Secondary | ICD-10-CM | POA: Diagnosis present

## 2019-01-06 DIAGNOSIS — J986 Disorders of diaphragm: Secondary | ICD-10-CM | POA: Diagnosis present

## 2019-01-06 DIAGNOSIS — I2510 Atherosclerotic heart disease of native coronary artery without angina pectoris: Secondary | ICD-10-CM | POA: Diagnosis present

## 2019-01-06 DIAGNOSIS — I10 Essential (primary) hypertension: Secondary | ICD-10-CM | POA: Diagnosis present

## 2019-01-06 DIAGNOSIS — E861 Hypovolemia: Secondary | ICD-10-CM | POA: Diagnosis present

## 2019-01-06 DIAGNOSIS — F329 Major depressive disorder, single episode, unspecified: Secondary | ICD-10-CM | POA: Diagnosis present

## 2019-01-06 DIAGNOSIS — Z9049 Acquired absence of other specified parts of digestive tract: Secondary | ICD-10-CM | POA: Diagnosis not present

## 2019-01-06 DIAGNOSIS — Z87891 Personal history of nicotine dependence: Secondary | ICD-10-CM | POA: Diagnosis not present

## 2019-01-06 DIAGNOSIS — R55 Syncope and collapse: Secondary | ICD-10-CM | POA: Diagnosis present

## 2019-01-06 DIAGNOSIS — I480 Paroxysmal atrial fibrillation: Secondary | ICD-10-CM | POA: Diagnosis present

## 2019-01-06 DIAGNOSIS — Z6828 Body mass index (BMI) 28.0-28.9, adult: Secondary | ICD-10-CM | POA: Diagnosis not present

## 2019-01-06 DIAGNOSIS — N400 Enlarged prostate without lower urinary tract symptoms: Secondary | ICD-10-CM | POA: Diagnosis present

## 2019-01-06 DIAGNOSIS — D500 Iron deficiency anemia secondary to blood loss (chronic): Secondary | ICD-10-CM | POA: Diagnosis present

## 2019-01-06 DIAGNOSIS — M109 Gout, unspecified: Secondary | ICD-10-CM | POA: Diagnosis present

## 2019-01-06 DIAGNOSIS — I491 Atrial premature depolarization: Secondary | ICD-10-CM | POA: Diagnosis present

## 2019-01-06 DIAGNOSIS — G4733 Obstructive sleep apnea (adult) (pediatric): Secondary | ICD-10-CM | POA: Diagnosis present

## 2019-01-06 DIAGNOSIS — E785 Hyperlipidemia, unspecified: Secondary | ICD-10-CM | POA: Diagnosis present

## 2019-01-06 DIAGNOSIS — E669 Obesity, unspecified: Secondary | ICD-10-CM | POA: Diagnosis present

## 2019-01-06 DIAGNOSIS — I4510 Unspecified right bundle-branch block: Secondary | ICD-10-CM | POA: Diagnosis present

## 2019-01-06 DIAGNOSIS — I471 Supraventricular tachycardia: Secondary | ICD-10-CM | POA: Diagnosis present

## 2019-01-06 DIAGNOSIS — J9611 Chronic respiratory failure with hypoxia: Secondary | ICD-10-CM | POA: Diagnosis present

## 2019-01-06 LAB — BASIC METABOLIC PANEL
Anion gap: 6 (ref 5–15)
BUN/Creatinine Ratio: 27 — ABNORMAL HIGH (ref 10–24)
BUN: 44 mg/dL — ABNORMAL HIGH (ref 8–27)
BUN: 39 mg/dL — ABNORMAL HIGH (ref 8–23)
CO2: 18 mmol/L — ABNORMAL LOW (ref 20–29)
CO2: 21 mmol/L — ABNORMAL LOW (ref 22–32)
Calcium: 8.9 mg/dL (ref 8.6–10.2)
Calcium: 8.5 mg/dL — ABNORMAL LOW (ref 8.9–10.3)
Chloride: 106 mmol/L (ref 96–106)
Chloride: 111 mmol/L (ref 98–111)
Creatinine, Ser: 1.63 mg/dL — ABNORMAL HIGH (ref 0.76–1.27)
Creatinine, Ser: 1.43 mg/dL — ABNORMAL HIGH (ref 0.61–1.24)
GFR calc Af Amer: 44 mL/min/{1.73_m2} — ABNORMAL LOW (ref >59)
GFR calc Af Amer: 52 mL/min — ABNORMAL LOW (ref >60)
GFR calc non Af Amer: 38 mL/min/{1.73_m2} — ABNORMAL LOW (ref >59)
GFR calc non Af Amer: 45 mL/min — ABNORMAL LOW (ref >60)
Glucose, Bld: 102 mg/dL — ABNORMAL HIGH (ref 70–99)
Glucose: 99 mg/dL (ref 65–99)
Potassium: 4.8 mmol/L (ref 3.5–5.2)
Potassium: 4.3 mmol/L (ref 3.5–5.1)
Sodium: 137 mmol/L (ref 134–144)
Sodium: 138 mmol/L (ref 135–145)

## 2019-01-06 LAB — CBC
HCT: 23.9 % — ABNORMAL LOW (ref 39.0–52.0)
Hematocrit: 25.6 % — ABNORMAL LOW (ref 37.5–51.0)
Hemoglobin: 8 g/dL — ABNORMAL LOW (ref 13.0–17.7)
Hemoglobin: 7.2 g/dL — ABNORMAL LOW (ref 13.0–17.0)
MCH: 27.1 pg (ref 26.6–33.0)
MCH: 26.8 pg (ref 26.0–34.0)
MCHC: 31.3 g/dL — ABNORMAL LOW (ref 31.5–35.7)
MCHC: 30.1 g/dL (ref 30.0–36.0)
MCV: 87 fL (ref 79–97)
MCV: 88.8 fL (ref 80.0–100.0)
Platelets: 236 10*3/uL (ref 150–450)
Platelets: 224 10*3/uL (ref 150–400)
RBC: 2.95 x10E6/uL — ABNORMAL LOW (ref 4.14–5.80)
RBC: 2.69 MIL/uL — ABNORMAL LOW (ref 4.22–5.81)
RDW: 15.7 % — ABNORMAL HIGH (ref 11.6–15.4)
RDW: 17.4 % — ABNORMAL HIGH (ref 11.5–15.5)
WBC: 5.8 10*3/uL (ref 3.4–10.8)
WBC: 6.2 10*3/uL (ref 4.0–10.5)
nRBC: 0 % (ref 0.0–0.2)

## 2019-01-06 LAB — URINALYSIS
Bilirubin, UA: NEGATIVE
Glucose, UA: NEGATIVE
Ketones, UA: NEGATIVE
Leukocytes,UA: NEGATIVE
Nitrite, UA: NEGATIVE
Protein,UA: NEGATIVE
RBC, UA: NEGATIVE
Specific Gravity, UA: 1.017 (ref 1.005–1.030)
Urobilinogen, Ur: 0.2 mg/dL (ref 0.2–1.0)
pH, UA: 5 (ref 5.0–7.5)

## 2019-01-06 LAB — MAGNESIUM: Magnesium: 2.1 mg/dL (ref 1.6–2.3)

## 2019-01-06 LAB — HEPATIC FUNCTION PANEL
ALT: 14 IU/L (ref 0–44)
AST: 16 IU/L (ref 0–40)
Albumin: 4.3 g/dL (ref 3.6–4.6)
Alkaline Phosphatase: 40 IU/L (ref 39–117)
Bilirubin Total: 0.3 mg/dL (ref 0.0–1.2)
Bilirubin, Direct: 0.1 mg/dL (ref 0.00–0.40)
Total Protein: 5.9 g/dL — ABNORMAL LOW (ref 6.0–8.5)

## 2019-01-06 LAB — TSH: TSH: 2.64 u[IU]/mL (ref 0.450–4.500)

## 2019-01-06 LAB — ECHOCARDIOGRAM LIMITED
Height: 70 in
Weight: 3146.41 oz

## 2019-01-06 LAB — GLUCOSE, CAPILLARY: Glucose-Capillary: 86 mg/dL (ref 70–99)

## 2019-01-06 MED ORDER — METOPROLOL TARTRATE 25 MG PO TABS
25.0000 mg | ORAL_TABLET | Freq: Two times a day (BID) | ORAL | Status: DC
  Administered 2019-01-06 – 2019-01-07 (×3): 25 mg via ORAL
  Filled 2019-01-06 (×3): qty 1

## 2019-01-06 MED ORDER — SODIUM CHLORIDE 0.9 % IV SOLN
125.0000 mg | Freq: Once | INTRAVENOUS | Status: AC
  Administered 2019-01-06: 125 mg via INTRAVENOUS
  Filled 2019-01-06: qty 10

## 2019-01-06 MED ORDER — ENSURE ENLIVE PO LIQD: 237.0000 mL | Freq: Every day | ORAL | Status: DC

## 2019-01-06 MED ORDER — SODIUM CHLORIDE 0.9 % IV SOLN
INTRAVENOUS | Status: DC | PRN
  Administered 2019-01-06: 250 mL via INTRAVENOUS

## 2019-01-06 MED ORDER — IOHEXOL 300 MG/ML  SOLN
80.0000 mL | Freq: Once | INTRAMUSCULAR | Status: AC | PRN
  Administered 2019-01-06: 80 mL via INTRAVENOUS

## 2019-01-06 MED ORDER — ADULT MULTIVITAMIN W/MINERALS CH
1.0000 | ORAL_TABLET | Freq: Every day | ORAL | Status: DC
  Administered 2019-01-06 – 2019-01-07 (×2): 1 via ORAL
  Filled 2019-01-06 (×2): qty 1

## 2019-01-06 NOTE — Progress Notes (Signed)
Initial Nutrition Assessment  RD working remotely.  DOCUMENTATION CODES:   Not applicable  INTERVENTION:   -MVI with minerals daily -Ensure Enlive po daily, each supplement provides 350 kcal and 20 grams of protein  NUTRITION DIAGNOSIS:   Increased nutrient needs related to chronic illness(COPD, emphysema) as evidenced by estimated needs.  GOAL:   Patient will meet greater than or equal to 90% of their needs  MONITOR:   PO intake, Supplement acceptance, Labs, Weight trends, Skin, I & O's  REASON FOR ASSESSMENT:   Malnutrition Screening Tool    ASSESSMENT:   [name] is a 85 y.o. male with medical history significant of COPD / diaphragmatic paralysis, OSA, O2 and CPAP at night but not on O2 during day, PAF on eliquis. Patient presents to the ED today with transient confusion episodes.  Pt admitted with near syncope/ AMS events.   Reviewed I/O's: +53 ml x 24 hours  UOP: 200 ml x 24 hours  Attempted to speak with pt by phone, however, unable to reach. Phone message reported "call cannot be completed as dialed".   Per H&P, pt has had 3 near syncope/AMS episodes on separate occasions PTA.   Pt consumed 100% of breakfast this morning per doc flowsheets. Per MST screening tool, pt reports recent unintentional wt loss and eating poorly due to a decreased appetite.   Reviewed wt hx; noted pt has experienced a 8.6% wt loss over the past 6 months, which is significant for time frame. Also noted pt was enrolled in [facility] approximately one year ago due to emphysema. Pt with increased nutritional needs related to COPD and emphysema diagnoses. Pt also at risk for malnutrition, however, unable to identify at this time.   Labs reviewed: CBGS: 86.   NUTRITION - FOCUSED PHYSICAL EXAM:    Most Recent Value  Orbital Region  Unable to assess  Upper Arm Region  Unable to assess  Thoracic and Lumbar Region  Unable to assess  Buccal Region  Unable  to assess  Temple Region  Unable to assess  Clavicle Bone Region  Unable to assess  Clavicle and Acromion Bone Region  Unable to assess  Scapular Bone Region  Unable to assess  Dorsal Hand  Unable to assess  Patellar Region  Unable to assess  Anterior Thigh Region  Unable to assess  Posterior Calf Region  Unable to assess  Edema (RD Assessment)  Unable to assess  Hair  Unable to assess  Eyes  Unable to assess  Mouth  Unable to assess  Skin  Unable to assess  Nails  Unable to assess       Diet Order:   Diet Order            Diet Heart Room service appropriate? Yes; Fluid consistency: Thin  Diet effective now              EDUCATION NEEDS:   No education needs have been identified at this time  Skin:  Skin Assessment: Reviewed RN Assessment  Last BM:  01/05/19  Height:   Ht Readings from Last 1 Encounters:  01/05/19 5\' 10"  (1.778 m)    Weight:   Wt Readings from Last 1 Encounters:  01/05/19 89.2 kg    Ideal Body Weight:  75.5 kg  BMI:  Body mass index is 28.22 kg/m.  Estimated Nutritional Needs:   Kcal:  2000-2200  Protein:  100-115 grams  Fluid:  > 2 L    Denyce Harr A.  Jimmye Norman, RD, LDN, [place] Registered Dietitian II Certified Diabetes Care and Education Specialist Pager: [phone] After hours Pager: [phone]

## 2019-01-06 NOTE — Progress Notes (Signed)
Placed patient on BIPAP for 5 min for CT. 12/6 60%. Tolerated well. Placed back on [facility] and sent back up stairs. No distress, no issues

## 2019-01-06 NOTE — Progress Notes (Signed)
PROGRESS NOTE    [name]  PIR:[id number] DOB: 1933-11-20 DOA: 01/05/2019 PCP: [name], MD    Brief Narrative:  [name] is a 85 y.o. male with a hx of  h/o PVCs s/p PVC ablation in 2011, PAF on chronic anticoagulation w/ Eliquis, mild CAD by cath in 2011, low risk NST in 2016, COPD/ diaphragmatic paralysis and OSA on CPAP and nocturnal O2, followed by pulmonary, obesity, h/o HTN however with recent issues with low BP requiring medication changes, HLD, BPH, h/o gout and depression who is admitted to the hospital with syncope.  He recently had multiple episodes of syncope.  Patient was found to have drop in hemoglobin from 12-7.2 today.  He is on Eliquis.    Assessment & Plan:   Principal Problem:   Near syncope Active Problems:   Essential hypertension   Disorder of diaphragm   Atrial fibrillation (HCC)   COPD (chronic obstructive pulmonary disease) with emphysema (HCC)   Chronic respiratory failure with hypoxia (HCC)   Orthostatic hypotension   Normocytic normochromic anemia   Iron deficiency anemia due to chronic blood loss  Near syncope, recurrent episodes: Most likely due to symptomatic anemia.  Cardiac work-up has been negative.  Seen by cardiology.  Recommended no further investigations.  His doxazosin has been discontinued.  Continue on telemetry.  Ambulate and check orthostatics.  Symptomatic anemia, severe iron deficiency anemia: Suspect due to chronic blood loss.  Hemoglobin 7.2.  Currently no indication for transfusion.  FOBT pending.  No evidence of active blood loss.  Hemodynamically stable.  Recheck tomorrow morning.  Will transfuse 1 unit of IV iron today and tomorrow. I called and discussed case with [facility].  They suggested to do CT scan of the abdomen pelvis to see any obvious abnormalities.  If no further drop in hemoglobin, they would like to schedule a follow-up outpatient for EGD and/or colonoscopy.  If any abnormal CT scan or drop in  hemoglobin, we need to reconsult [facility].  Paroxysmal atrial fibrillation: Currently sinus rhythm.  Eliquis discontinued.  Rate well controlled currently.  Obstructive sleep apnea/diaphragmatic paralysis: On CPAP at night and at nebs.  Continue.  Patient has significant anemia requiring inpatient iron transfusions and investigations.  Patient will need to stay in the hospital.  Will change to inpatient.  DVT prophylaxis: SCDs Code Status: Full code Family Communication: Daughter updated over the phone Disposition Plan: Home.   Consultants:   Cardiology, signed off  Gastroenterology, case discussed.  Will need to reconsult if abnormal CT scan or drop in hemoglobin.  Procedures:   None  Antimicrobials:   None   Subjective: Patient was seen and examined.  He denies any complaints.  He has not walked yet.  No bowel movement yet.  Telemetry without any arrhythmias.  Orthostatics negative.  Objective: Vitals:   01/05/19 2119 01/05/19 2128 01/06/19 0308 01/06/19 0837  BP:   (!) 104/55 (!) 105/53  Pulse:   (!) 54 78  Resp:   18 16  Temp:   98 F (36.7 C) 98.2 F (36.8 C)  TempSrc:    Oral  SpO2: 93%  98% 95%  Weight:  89.2 kg    Height:  5\' 10"  (1.778 m)      Intake/Output Summary (Last 24 hours) at 01/06/2019 1335 Last data filed at 01/06/2019 1224 Gross per 24 hour  Intake 1203 ml  Output 975 ml  Net 228 ml   Filed Weights   01/05/19 1737 01/05/19 2128  Weight: 90.6 kg 89.2 kg    Examination:  General exam: Appears calm and comfortable  Respiratory system: Clear to auscultation. Respiratory effort normal. Cardiovascular system: S1 & S2 heard, RRR. No JVD, murmurs, rubs, gallops or clicks. No pedal edema. Gastrointestinal system: Abdomen is nondistended, soft and nontender. No organomegaly or masses felt. Normal bowel sounds heard. Central nervous system: Alert and oriented. No focal neurological deficits. Extremities: Symmetric 5 x 5 power. Skin: No rashes,  lesions or ulcers Psychiatry: Judgement and insight appear normal. Mood & affect appropriate.     Data Reviewed: I have personally reviewed following labs and imaging studies  CBC: Recent Labs  Lab 01/05/19 1250 01/05/19 1730 01/06/19 0404  WBC 5.8 7.1 6.2  NEUTROABS  --  4.7  --   HGB 8.0* 8.2* 7.2*  HCT 25.6* 27.4* 23.9*  MCV 87 89.8 88.8  PLT 236 232 749   Basic Metabolic Panel: Recent Labs  Lab 01/05/19 1250 01/05/19 1730 01/06/19 0404  NA 137 137 138  K 4.8 4.5 4.3  CL 106 108 111  CO2 18* 21* 21*  GLUCOSE 99 119* 102*  BUN 44* 47* 39*  CREATININE 1.63* 1.57* 1.43*  CALCIUM 8.9 8.7* 8.5*  MG 2.1  --   --    GFR: Estimated Creatinine Clearance: 44 mL/min (A) (by C-G formula based on SCr of 1.43 mg/dL (H)). Liver Function Tests: Recent Labs  Lab 01/05/19 1250 01/05/19 1730  AST 16 21  ALT 14 16  ALKPHOS 40 40  BILITOT 0.3 0.3  PROT 5.9* 6.4*  ALBUMIN 4.3 4.0   No results for input(s): LIPASE, AMYLASE in the last 168 hours. No results for input(s): AMMONIA in the last 168 hours. Coagulation Profile: Recent Labs  Lab 01/05/19 1730  INR 1.3*   Cardiac Enzymes: No results for input(s): CKTOTAL, CKMB, CKMBINDEX, TROPONINI in the last 168 hours. BNP (last 3 results) No results for input(s): PROBNP in the last 8760 hours. HbA1C: No results for input(s): HGBA1C in the last 72 hours. CBG: Recent Labs  Lab 01/06/19 0645  GLUCAP 86   Lipid Profile: No results for input(s): CHOL, HDL, LDLCALC, TRIG, CHOLHDL, LDLDIRECT in the last 72 hours. Thyroid Function Tests: Recent Labs    01/05/19 1250  TSH 2.640   Anemia Panel: Recent Labs    01/05/19 2059  VITAMINB12 193  FOLATE 9.6  FERRITIN 9*  TIBC 454*  IRON 32*  RETICCTPCT 1.0   Sepsis Labs: No results for input(s): PROCALCITON, LATICACIDVEN in the last 168 hours.  No results found for this or any previous visit (from the past 240 hour(s)).       Radiology Studies: X-ray Chest Pa  And Lateral  Result Date: 01/05/2019 CLINICAL DATA:  Chronic shortness of breath, history atrial fibrillation, hypertension, former smoker EXAM: CHEST - 2 VIEW COMPARISON:  07/23/2017 FINDINGS: Upper normal heart size. Mediastinal contours and pulmonary vascularity normal. Bibasilar atelectasis. Upper lungs clear. No acute infiltrate, pleural effusion or pneumothorax. Bones demineralized. IMPRESSION: Bibasilar atelectasis. Electronically Signed   By: Lavonia Dana M.D.   On: 01/05/2019 20:34   Ct Head Wo Contrast  Result Date: 01/05/2019 CLINICAL DATA:  TIA initial exam EXAM: CT HEAD WITHOUT CONTRAST TECHNIQUE: Contiguous axial images were obtained from the base of the skull through the vertex without intravenous contrast. COMPARISON:  None. FINDINGS: Brain: Mild atrophy. Negative for hydrocephalus. Negative for acute infarct, hemorrhage, or mass lesion. Vascular: Negative for hyperdense vessel Skull: Negative Sinuses/Orbits: Paranasal sinuses clear. Bilateral cataract surgery.  No orbital lesion. Other: None IMPRESSION: No acute abnormality. Electronically Signed   By: Franchot Gallo M.D.   On: 01/05/2019 18:49        Scheduled Meds:  allopurinol  300 mg Oral Daily   buPROPion  100 mg Oral BID   doxazosin  4 mg Oral Daily   feeding supplement (ENSURE ENLIVE)  237 mL Oral Q1400   metoprolol tartrate  25 mg Oral BID   multivitamin with minerals  1 tablet Oral Daily   pravastatin  40 mg Oral q1800   sertraline  100 mg Oral Daily   sodium chloride flush  3 mL Intravenous Q12H   traZODone  100 mg Oral QHS   Continuous Infusions:  [START ON 01/07/2019] ferric gluconate (FERRLECIT/NULECIT) IV       LOS: 0 days    Time spent: 25 minutes    Barb Merino, MD [facility] Pager [phone]  If 7PM-7AM, please contact night-coverage [email or web address] Password [email or web address] 01/06/2019, 1:35 PM

## 2019-01-06 NOTE — Progress Notes (Signed)
Pt says he needs to have cpap in place when he lies flat due to paralyzed diaphram. Spoke to alex in [place] and pt can have cpap as long [facility] comes with pt, I spoke to [place] and she will bring machine to CT when they come pick him up and to call her when transport arrives

## 2019-01-06 NOTE — Progress Notes (Signed)
Pt drinking 2nd bottle of contrast, tolerating well, informed Ct to be done around 2pm

## 2019-01-06 NOTE — Progress Notes (Signed)
Patient on home unit at this time, no distress noted.

## 2019-01-06 NOTE — Progress Notes (Signed)
Pt hemoglobin is 7.2. On call [facility] paged. Awaiting orders. Will continue to monitor pt.

## 2019-01-06 NOTE — Progress Notes (Signed)
Unable to reach patient.

## 2019-01-06 NOTE — Consult Note (Signed)
Cardiology Consultation:   Patient ID: [name] MRN: [id number]; DOB: May 10, 1934  Admit date: 01/05/2019 Date of Consult: 01/06/2019  Primary Care Provider: [name], MD Primary Cardiologist: Truitt Merle, NP/ Dr. [name]  Primary Electrophysiologist:  Thompson Grayer, MD    Patient Profile:   [name] is a 85 y.o. male with a hx of  h/o PVCs s/p PVC ablation in 2011, PAF on chronic anticoagulation w/ Eliquis, mild CAD by cath in 2011, low risk NST in 2016, COPD/ diaphragmatic paralysis and OSA on CPAP and nocturnal O2, followed by pulmonary, obesity, h/o HTN however with recent issues with low BP requiring medication changes, HLD, BPH, h/o gout and depression, who is being seen today for the evaluation of syncope at the request of Dr. Alcario Drought, Internal Medicine.   History of Present Illness:   [name] is an 85 y/o male, with a h/o PVCs s/p PVC ablation in 2011, PAF on chronic anticoagulation w/ Eliquis, mild CAD by cath in 2011, low risk NST in 2016, COPD/ diaphragmatic paralysis and OSA on CPAP and nocturnal O2, followed by pulmonary, obesity, h/o HTN however with recent issues with low BP requiring medication changes, HLD, BPH,  h/o gout and depression.   He was previously followed by Dr. [name] and now primarily followed by Truitt Merle, NP. Dr. [name] follows him in EP clinic. Most recent cardiac study was an outpatient Holter monitor 02/2018 which showed sinus rhythm with first degree AV block and right bundle branch block. Average heart rate was 62 bpm. Occasional premature ventricular contractions noted. Frequent premature atrial contractions with nonsustained atrial tachycardia also recorded. Nonsustained atrial fibrillation also observed. He had nocturnal bradycardia with mobitz I second degree AV block noted transiently. There were no  sustained arrhythmias and no prolonged pauses. His last echo was in 2016. This showed normal LVEF at 60-65%, normal wall motion  and no significant valvular dysfunction. NST, also in 2016, was a low risk study w/ a medium-sized basal inferior and basal to mid inferolateral perfusion defect that was actually worse at rest than with stress. There was no ischemia. Wall motion and EF were normal. Given normal wall motion, defect was felt to represent diaphragmatic attenuation.   As note above, he has had recent issues with low BP and has required several medication changes.  Both Norvasc and hydralazine were previoulsy discontinued.  He had a f/u E-visit yesterday w/ Truitt Merle and home BP was recorded at 96/46. Pulse rate was 61 bpm. Also notable 12 lb weight loss over the course of  6 weeks. There was question as to whether his depression was contributing.  He was advised to d/c his lisinopril (20 mg daily). Remaining BP medications, metoprolol succinate BID for PAF and doxazosin for BPH were continued. Given his low BP, he was also ordered to get laboratory work including a CBC and was also ordered to get a UA.   Pt came to the office for labs. Per family, he was able to go to clinic and have labs drawn w/o much difficulty. However, upon returning home, he was very confused and had a syncopal spell. Per H&P, he got out of car, stood up, became confused and "zoned out" for about 3 mins, was basically unresponsive during this time.  Eventually came back to it, was able to step up on curb and continue going into house. He had a similar spell 3 days prior. His daughter checked his BP and it was low at 81/30. Daughter called  the office and pt was instructed to go to [facility] for further evaluation and CT scan.   In the ED, head CT showed no acute abnormalties, however orthostatic VS were +. SBP from sitting to standing (0 min) dropped from 143>>99 mmHg (see below). EKG SR w/ RBBB, 83 bpm. Labs confirmed new anemia. Hgb declined from 12.5 (04/2018) to 8.2 on 4/28. Hgb has further dropped in the past 24 hrs to 7.2. FOBT pending. Anemia panel c/w  IDA. Iron low at 32. Ferritin also low at 9. AKI also noted on BMP. SCr 1.63 on admit (baseline 1.1-1.2), BUN 44. Renal markers improved today, SCr down to 1.43, BUN 39.  BP remains soft, in the mid 73U-KGU 542H systolic. Eliquis is on hold. His home metoprolol was changed from succinate 25 mg BID to now tartrate 25 mg BID.  Doxazosin continued for BPH.    Orthostatic VS for the past 24 hrs:  BP- Lying Pulse- Lying BP- Sitting Pulse- Sitting BP- Standing at 0 minutes Pulse- Standing at 0 minutes  01/05/19 2207 125/60 (!) 48 143/73 86 99/69 82  01/05/19 1821 145/67 87 126/59 86 121/62 82     Past Medical History:  Diagnosis Date  . BPH (benign prostatic hyperplasia)   . Coronary artery ectasia    Mild CAD with normal systolic function per cath in August of 2011  . Diaphragmatic paralysis    felt to be partially responsible for dyspnea  . Dyspnea    due to paralyzed diaphragm and pulmonary issues  . Gout   . HTN (hypertension)   . Hypercholesteremia   . Microhematuria   . Obesity   . OSA (obstructive sleep apnea)   . Paroxysmal atrial fibrillation (HCC)    occured in the setting of acute E Coli sepsis and ileius 7/12  . PVC (premature ventricular contraction)    s/p PVC ablation 05/29/2010    Past Surgical History:  Procedure Laterality Date  . APPENDECTOMY  1978  . CARDIAC CATHETERIZATION  04-30-2010   Left main coronary artery is normal.   . CARDIOVASCULAR STRESS TEST  03-19-2010   0%  . CHOLECYSTECTOMY  1990  . poly removed from nose as a child    . PVC ablation  05/29/2010  . US ECHOCARDIOGRAPHY  03-09-2010   EF 55-60%     Home Medications:  Prior to Admission medications   Medication Sig Start Date End Date Taking? Authorizing Provider  allopurinol (ZYLOPRIM) 300 MG tablet Take 300 mg by mouth daily.     Yes [provider]  apixaban (ELIQUIS) 5 MG TABS tablet Take 1 tablet (5 mg total) by mouth 2 (two) times daily. 07/22/18  Yes Burtis Junes, NP  buPROPion  (WELLBUTRIN SR) 100 MG 12 hr tablet Take 100 mg by mouth 2 (two) times daily.   Yes [provider]  doxazosin (CARDURA) 4 MG tablet Take 1 tablet (4 mg total) by mouth daily. 02/11/13  Yes [name], Jeneen Rinks, MD  lovastatin (MEVACOR) 40 MG tablet Take 40 mg by mouth at bedtime.     Yes [provider]  metoprolol succinate (TOPROL-XL) 25 MG 24 hr tablet Take 25 mg by mouth 2 (two) times daily.  05/15/11  Yes [name], Jeneen Rinks, MD  OXYGEN Inhale 2 L into the lungs at bedtime.   Yes [provider]  sertraline (ZOLOFT) 100 MG tablet Take 100 mg by mouth daily. 09/14/14  Yes [provider]  traZODone (DESYREL) 100 MG tablet Take 100 mg by mouth  at bedtime.     Yes [provider]    Inpatient Medications: Scheduled Meds: . allopurinol  300 mg Oral Daily  . buPROPion  100 mg Oral BID  . doxazosin  4 mg Oral Daily  . feeding supplement (ENSURE ENLIVE)  237 mL Oral Q1400  . metoprolol tartrate  25 mg Oral BID  . multivitamin with minerals  1 tablet Oral Daily  . pravastatin  40 mg Oral q1800  . sertraline  100 mg Oral Daily  . sodium chloride flush  3 mL Intravenous Q12H  . traZODone  100 mg Oral QHS   Continuous Infusions: . ferric gluconate (FERRLECIT/NULECIT) IV     PRN Meds:   Allergies:   No Known Allergies  Social History:   Social History   Socioeconomic History  . Marital status: Married    Spouse name: Not on file  . Number of children: Not on file  . Years of education: Not on file  . Highest education level: Not on file  Occupational History  . Occupation: Retired  Scientific laboratory technician  . Financial resource strain: Not on file  . Food insecurity:    Worry: Not on file    Inability: Not on file  . Transportation needs:    Medical: Not on file    Non-medical: Not on file  Tobacco Use  . Smoking status: Former Smoker    Packs/day: 3.00    Years: 20.00    Pack years: 60.00    Types: Cigarettes    Last attempt to quit: 09/09/1984    Years  since quitting: 34.3  . Smokeless tobacco: Never Used  Substance and Sexual Activity  . Alcohol use: Yes    Alcohol/week: 0.0 standard drinks    Comment: 1 glass of wine per week  . Drug use: No  . Sexual activity: Yes  Lifestyle  . Physical activity:    Days per week: Not on file    Minutes per session: Not on file  . Stress: Not on file  Relationships  . Social connections:    Talks on phone: Not on file    Gets together: Not on file    Attends religious service: Not on file    Active member of club or organization: Not on file    Attends meetings of clubs or organizations: Not on file    Relationship status: Not on file  . Intimate partner violence:    Fear of current or ex partner: Not on file    Emotionally abused: Not on file    Physically abused: Not on file    Forced sexual activity: Not on file  Other Topics Concern  . Not on file  Social History Narrative  . Not on file    Family History:    Family History  Problem Relation Age of Onset  . Aneurysm Father 75       brain aneurysm  . Breast cancer Mother   . Breast cancer Daughter      ROS:  Please see the history of present illness.   All other ROS reviewed and negative.     Physical Exam/Data:   Vitals:   01/05/19 2119 01/05/19 2128 01/06/19 0308 01/06/19 0837  BP:   (!) 104/55 (!) 105/53  Pulse:   (!) 54 78  Resp:   18 16  Temp:   98 F (36.7 C) 98.2 F (36.8 C)  TempSrc:    Oral  SpO2: 93%  98% 95%  Weight:  89.2 kg    Height:  5\' 10"  (1.778 m)      Intake/Output Summary (Last 24 hours) at 01/06/2019 1036 Last data filed at 01/06/2019 0800 Gross per 24 hour  Intake 1103 ml  Output 975 ml  Net 128 ml   Last 3 Weights 01/05/2019 01/05/2019 01/05/2019  Weight (lbs) 196 lb 10.4 oz 199 lb 12.8 oz 199 lb 12.8 oz  Weight (kg) 89.2 kg 90.629 kg 90.629 kg     Body mass index is 28.22 kg/m.  General: Elderly male in no distress.  Currently resting comfortably on CPAP HEENT: normal Lymph: no  adenopathy Neck: no JVD Endocrine:  No thryomegaly Vascular: No carotid bruits; FA pulses 2+ bilaterally   Cardiac:  normal S1, S2; RRR; no murmur, distant heart sounds Lungs:  clear to auscultation bilaterally, no wheezing, rhonchi or rales  Abd: soft, nontender, no hepatomegaly, obese Ext: no edema Musculoskeletal:  No deformities, BUE and BLE strength normal and equal Skin: warm and dry  Neuro:  CNs 2-12 intact, no focal abnormalities noted Psych:  Normal affect   EKG:  The EKG was personally reviewed and demonstrates:  SR, RBBB 83 bpm   Telemetry:  Telemetry was personally reviewed and demonstrates: Sinus rhythm heart rate 55 to 65 bpm with occasional PVCs  Relevant CV Studies: Holter 02/2018 Narrative & Impression    Sinus rhythm with first degree AV block, Right bundle branch block Average heart rate is 62 bpm Occasional premature ventricular contractions Frequent premature atrial contractions with nonsustained atrial tachycardia Nonsustained atrial fibrillation also observed Nocturnal bradycardia with mobitz I second degree AV block is noted transiently No sustained arrhythmias No prolonged pauses      Myoview Impression from March 2016 Exercise Capacity: Lexiscan with no exercise. BP Response: Normal blood pressure response. Clinical Symptoms: Nausea. ECG Impression: Atrial fibrillation, no changes from baseline.  Comparison with Prior Nuclear Study: No images to compare  Overall Impression: Low risk stress nuclear study a medium-sized basal inferior and basal to mid inferolateral perfusion defect that is actually worse at rest than with stress. No ischemia. Given normal wall motion, this may represent diaphragmatic attenuation. .  LV Ejection Fraction: 63%. LV Wall Motion: NL LV Function; NL Wall Motion   Loralie Champagne 11/07/2014   Echo Study Conclusions from February 2016  - Left ventricle: The cavity size was normal. Wall thickness was  increased in a pattern of mild LVH. Systolic function was normal. The estimated ejection fraction was in the range of 60% to 65%. Wall motion was normal; there were no regional wall motion abnormalities. - Aortic valve: There was trivial regurgitation. - Left atrium: The atrium was mildly dilated. - Right atrium: The atrium was mildly dilated.   Laboratory Data:  Chemistry Recent Labs  Lab 01/05/19 1250 01/05/19 1730 01/06/19 0404  NA 137 137 138  K 4.8 4.5 4.3  CL 106 108 111  CO2 18* 21* 21*  GLUCOSE 99 119* 102*  BUN 44* 47* 39*  CREATININE 1.63* 1.57* 1.43*  CALCIUM 8.9 8.7* 8.5*  GFRNONAA 38* 40* 45*  GFRAA 44* 47* 52*  ANIONGAP  --  8 6    Recent Labs  Lab 01/05/19 1250 01/05/19 1730  PROT 5.9* 6.4*  ALBUMIN 4.3 4.0  AST 16 21  ALT 14 16  ALKPHOS 40 40  BILITOT 0.3 0.3   Hematology Recent Labs  Lab 01/05/19 1250 01/05/19 1730 01/05/19 2059 01/06/19 0404  WBC 5.8 7.1  --  6.2  RBC 2.95*  3.05* 3.16* 2.69*  HGB 8.0* 8.2*  --  7.2*  HCT 25.6* 27.4*  --  23.9*  MCV 87 89.8  --  88.8  MCH 27.1 26.9  --  26.8  MCHC 31.3* 29.9*  --  30.1  RDW 15.7* 17.5*  --  17.4*  PLT 236 232  --  224   Cardiac EnzymesNo results for input(s): TROPONINI in the last 168 hours. No results for input(s): TROPIPOC in the last 168 hours.  BNPNo results for input(s): BNP, PROBNP in the last 168 hours.  DDimer No results for input(s): DDIMER in the last 168 hours.  Radiology/Studies:  X-ray Chest Pa And Lateral  Result Date: 01/05/2019 CLINICAL DATA:  Chronic shortness of breath, history atrial fibrillation, hypertension, former smoker EXAM: CHEST - 2 VIEW COMPARISON:  07/23/2017 FINDINGS: Upper normal heart size. Mediastinal contours and pulmonary vascularity normal. Bibasilar atelectasis. Upper lungs clear. No acute infiltrate, pleural effusion or pneumothorax. Bones demineralized. IMPRESSION: Bibasilar atelectasis. Electronically Signed   By: Lavonia Dana M.D.   On:  01/05/2019 20:34   Ct Head Wo Contrast  Result Date: 01/05/2019 CLINICAL DATA:  TIA initial exam EXAM: CT HEAD WITHOUT CONTRAST TECHNIQUE: Contiguous axial images were obtained from the base of the skull through the vertex without intravenous contrast. COMPARISON:  None. FINDINGS: Brain: Mild atrophy. Negative for hydrocephalus. Negative for acute infarct, hemorrhage, or mass lesion. Vascular: Negative for hyperdense vessel Skull: Negative Sinuses/Orbits: Paranasal sinuses clear. Bilateral cataract surgery. No orbital lesion. Other: None IMPRESSION: No acute abnormality. Electronically Signed   By: Franchot Gallo M.D.   On: 01/05/2019 18:49    Assessment and Plan:   ROMAINE [name] is a 85 y.o. male with a hx of  h/o PVCs s/p PVC ablation in 2011, PAF on chronic anticoagulation w/ Eliquis, mild CAD by cath in 2011, low risk NST in 2016, COPD/ diaphragmatic paralysis and OSA on CPAP and nocturnal O2, followed by pulmonary, obesity, h/o HTN however with recent issues with low BP requiring medication changes, HLD, BPH, h/o gout and depression, who is being seen today for the evaluation of syncope at the request of Dr. Alcario Drought, Internal Medicine.   1. Syncope: recent history, evidence of hypovolemia, IDA w/ Hgb now at 7.2 and + orthostatic vitals support orthostatic hypotension as the most likely cause of symptoms. No indication for additional cardiac w/u at this time. Needs GI w/u and blood transfusion. Monitor BP closely.   2. IDA: new diagnosis. Drop in Hgb over the course 8 months from 12 to 7.2. Anemia panel c/w iron deficiency. FOBT pending. Needs GI w/u to exclude GIB. Agree w/ holding Eliquis. IV Fe ordered per primary team.   3. AKI: suspect likely prerenal from hypovolemia/hypotension. Also ? Decreased PO intake given depression and recent 12 lb weight loss (if weight loss unintentional, may ? Underlying GI malignancy). SCr on admit was 1.6 (baseline 1.1-1.2). Improved w/ IVFs. SCr 1.43 today.  Continue to monitor closely. Avoid nephrotoxic agents.   4. Orthostatic Hypotension: secondary to hypovolemia. SBP dropped from 143>>99 mmHg from sitting to standing in the ED. Continue to treat underlying cause. Blood transfusion and IVFs. He remains on metoprolol for rate control given h/o PAF and PVCs. Agree with changing metoprolol from BID metoprolol succinate to metoprolol tartrate. He requires Cardura for BPH. Abilify can also cause orthostatic hypotension, 3% risk per literature review. Continue to monitor BP closely.   5. PAF: given anemia, watch on tele for tachycardia, PAF. Continue metoprolol for rate  control, as long as BP tolerates. Agree w/ holding Eliquis given suspected GIB. Further recs regarding resumption of long term anticoagulation to be determined based on GI w/u and recs from gastroenterologist.   MD to follow with further recommendations.   For questions or updates, please contact [place] Please consult [email or web address] for contact info under  Signed, Lyda Jester, PA-C  01/06/2019 10:36 AM  Patient seen, examined. Available data reviewed. Agree with findings, assessment, and plan as outlined by Lyda Jester, PA-C.  Physical exam findings documented above reflect my personal exam findings of this patient today.  The patient's history is well outlined above.  He has paroxysmal atrial fibrillation managed with long-term oral anticoagulation using Eliquis.  He has a history of PVCs with PVC ablation in the past.  The patient is on nocturnal oxygen and CPAP.  He presents now with episodes of near syncope/transient confusion.  He has demonstrated low blood pressure over recent months with an appropriate decrease in his antihypertensive medications.  The patient appears to have symptomatic anemia with a 4 g drop in his hemoglobin over the past year, now with hemoglobin of 7.2 mg/dL.  I suspect this is a primary issue for this patient as it relates to the symptoms  prompting his hospitalization.  I agree with holding anticoagulation and obtaining inpatient GI consultation for evaluation of a potential bleeding source.  The patient's echocardiogram will be updated and he is monitored on telemetry for potential arrhythmic source of symptoms.  Telemetry review demonstrates sinus rhythm without significant arrhythmia and only occasional PVCs.  We will follow with you, but otherwise not much to add from a cardiac perspective at this point.  Sherren Mocha, M.D. 01/06/2019 12:05 PM

## 2019-01-07 ENCOUNTER — Encounter (HOSPITAL_COMMUNITY): Payer: Self-pay

## 2019-01-07 LAB — BASIC METABOLIC PANEL
Anion gap: 7 (ref 5–15)
BUN: 29 mg/dL — ABNORMAL HIGH (ref 8–23)
CO2: 22 mmol/L (ref 22–32)
Calcium: 8.8 mg/dL — ABNORMAL LOW (ref 8.9–10.3)
Chloride: 109 mmol/L (ref 98–111)
Creatinine, Ser: 1.5 mg/dL — ABNORMAL HIGH (ref 0.61–1.24)
GFR calc Af Amer: 49 mL/min — ABNORMAL LOW (ref >60)
GFR calc non Af Amer: 42 mL/min — ABNORMAL LOW (ref >60)
Glucose, Bld: 97 mg/dL (ref 70–99)
Potassium: 4.3 mmol/L (ref 3.5–5.1)
Sodium: 138 mmol/L (ref 135–145)

## 2019-01-07 LAB — GLUCOSE, CAPILLARY: Glucose-Capillary: 87 mg/dL (ref 70–99)

## 2019-01-07 LAB — CBC
HCT: 24.6 % — ABNORMAL LOW (ref 39.0–52.0)
Hemoglobin: 7.5 g/dL — ABNORMAL LOW (ref 13.0–17.0)
MCH: 26.8 pg (ref 26.0–34.0)
MCHC: 30.5 g/dL (ref 30.0–36.0)
MCV: 87.9 fL (ref 80.0–100.0)
Platelets: 230 10*3/uL (ref 150–400)
RBC: 2.8 MIL/uL — ABNORMAL LOW (ref 4.22–5.81)
RDW: 17.6 % — ABNORMAL HIGH (ref 11.5–15.5)
WBC: 6 10*3/uL (ref 4.0–10.5)
nRBC: 0 % (ref 0.0–0.2)

## 2019-01-07 LAB — URINE CULTURE

## 2019-01-07 MED ORDER — PANTOPRAZOLE SODIUM 40 MG PO TBEC: 40.0000 mg | DELAYED_RELEASE_TABLET | Freq: Every day | ORAL | 3 refills | Status: DC

## 2019-01-07 MED ORDER — FERROUS SULFATE 325 (65 FE) MG PO TBEC: 325.0000 mg | DELAYED_RELEASE_TABLET | Freq: Two times a day (BID) | ORAL | 0 refills | Status: DC

## 2019-01-07 MED ORDER — METOPROLOL TARTRATE 25 MG PO TABS: 25.0000 mg | ORAL_TABLET | Freq: Two times a day (BID) | ORAL | 0 refills | Status: DC

## 2019-01-07 NOTE — Progress Notes (Signed)
Nutrition Follow-up  DOCUMENTATION CODES:   Not applicable  INTERVENTION:   -Continue MVI with minerals daily -D/c Ensure Enlive, due to poor acceptance -Magic Cup BID with meals, each supplement provides 290 kcals and 9 grams protein  NUTRITION DIAGNOSIS:   Increased nutrient needs related to chronic illness(COPD, emphysema) as evidenced by estimated needs.  Ongoing  GOAL:   Patient will meet greater than or equal to 90% of their needs  Progressing  MONITOR:   PO intake, Supplement acceptance, Labs, Weight trends, Skin, I & O's  REASON FOR ASSESSMENT:   Malnutrition Screening Tool    ASSESSMENT:   [name] is a 85 y.o. male with medical history significant of COPD / diaphragmatic paralysis, OSA, O2 and CPAP at night but not on O2 during day, PAF on eliquis. Patient presents to the ED today with transient confusion episodes.  Reviewed I/O's: -295 ml x 24 hours and +248 ml since admission  UOP: 2.6 L x 24 hours  Pt not in room at time of visit. Observed pt's breakfast tray; noted 100% meal completion. Per RN notes, pt refusing Ensure supplements as he feels full after eating his meals. Noted 100% meal completion per doc flowsheets.   Noted pt with significant wt loss (8.6% wt loss x 6 months). Pt would benefit from nutritional supplements. Will try Magic Cup with meals.  Labs reviewed: CBGS: 86.   Diet Order:   Diet Order            Diet - low sodium heart healthy        Diet Heart Room service appropriate? Yes; Fluid consistency: Thin  Diet effective now              EDUCATION NEEDS:   No education needs have been identified at this time  Skin:  Skin Assessment: Reviewed RN Assessment  Last BM:  01/05/19  Height:   Ht Readings from Last 1 Encounters:  01/05/19 5\' 10"  (1.778 m)    Weight:   Wt Readings from Last 1 Encounters:  01/07/19 89.9 kg    Ideal Body Weight:  75.5 kg  BMI:  Body mass index is 28.44 kg/m.  Estimated  Nutritional Needs:   Kcal:  2000-2200  Protein:  100-115 grams  Fluid:  > 2 L    [name] A. Jimmye Norman, RD, LDN, [place] Registered Dietitian II Certified Diabetes Care and Education Specialist Pager: [phone] After hours Pager: [phone]

## 2019-01-07 NOTE — Progress Notes (Signed)
   01/07/19 0930  Mobility  Activity Ambulated in hall;Dangled on edge of bed  Range of Motion Active;All extremities  Level of Assistance Standby assist, set-up cues, supervision of patient - no hands on  Assistive Device Four wheel walker  Minutes Stood 5 minutes  Minutes Ambulated 5 minutes  Distance Ambulated (ft) 360 ft  Mobility Response Tolerated well  Bed Position Chair   SATURATION QUALIFICATIONS: (This note is used to comply with regulatory documentation for home oxygen)  Patient Saturations on Room Air at Rest = 91%  Patient Saturations on Room Air while Ambulating = 87%  Patient Saturations on 2 Liters of oxygen while Ambulating = 98%  Please briefly explain why patient needs home oxygen: Patient desaturated on room air while ambulating to the mid to high 80's, attempted pursed breathing to increase oxygen but was unsuccessful. Patient needed 2LO2 to maintain an oxygen saturation of <95%

## 2019-01-07 NOTE — Discharge Summary (Signed)
Physician Discharge Summary  [name] OVF:[id number] DOB: December 04, 1933 DOA: 01/05/2019  PCP: [name], MD  Admit date: 01/05/2019 Discharge date: 01/07/2019  Admitted From: home  Disposition:  Home   Recommendations for Outpatient Follow-up:  1. Follow up with PCP in 1-2 weeks 2. Follow-up with [facility] gastroenterology.  A referral has been sent.  Home Health: None Equipment/Devices: CPAP, patient has his own.  Oxygen as needed.  Discharge Condition: Stable CODE STATUS: Full code Diet recommendation: Cardiac  Brief/Interim Summary: 85 year old gentleman with history of PVCs, PAF on chronic anticoagulation with Eliquis, mild coronary artery disease, COPD/diaphragmatic paralysis and obstructive sleep apnea on CPAP and nocturnal oxygen followed by pulmonology, obesity, hypertension with recent issues of dizziness and orthostatic, hyperlipidemia, BPH who is recently suffering from dizzy spells and fatigue came to the emergency room due to a near syncopal episode.  Patient was found to have drop in hemoglobin.  He was on Eliquis.  Discharge Diagnoses:  Principal Problem:   Near syncope Active Problems:   Essential hypertension   Disorder of diaphragm   Atrial fibrillation (HCC)   COPD (chronic obstructive pulmonary disease) with emphysema (HCC)   Chronic respiratory failure with hypoxia (HCC)   Orthostatic hypotension   Normocytic normochromic anemia   Iron deficiency anemia due to chronic blood loss  Near syncope, recurrent episodes: this is most likely due to symptomatic anemia.  Cardiac work-up has been negative.  Seen by cardiology.  Recommended no further investigations. Patient and family were instructed about orthostatic precautions.  May need to discontinue doxazosin if continued to have symptoms.  Symptomatic anemia, severe iron deficiency anemia: Suspect due to chronic blood loss. Baseline hemoglobin 12.  Hemoglobin was 7.2 and 7.5 today.  Severe iron deficiency.   No indication for blood transfusion.  Patient was given 2 doses of IV iron while in the hospital.  Patient also does not have any evidence of active bleeding and he is hemodynamically stable. We will discharge patient on iron supplements. We will discharge patient on Protonix 40 mg once a day.  Is going to stop Eliquis until GI evaluation. I called and discussed case with Dr. Paulita Fujita from [facility] physician.  Called [facility] office and schedule follow-up for him.  He will need endoluminal evaluation.  He will hold off on taking Eliquis until then.  A CT scan of the abdomen pelvis with contrast was done and that was essentially normal.  A probable benign pancreatic cyst was present.  FOBT was negative.  Paroxysmal atrial fibrillation: Currently sinus rhythm.  Eliquis discontinued.  Rate well controlled currently.  Cardiology recommended changing Toprol-XL to metoprolol tartrate.  Obstructive sleep apnea/diaphragmatic paralysis: On CPAP at night and at nebs.  Continue.  He may need to use oxygen in the daytime and will be prescribed.  Patient remains fairly stable.  He has been on room air at rest, however needing 1 to 2 L of oxygen on ambulation.  He has significant diaphragmatic paralysis and needs his CPAP machine most of the time when laying flat.  His hemoglobin has remained stable.  Discussed with patient and family, GI follow-up scheduled and he is going home today.   Discharge Instructions  Discharge Instructions    Call MD for:  difficulty breathing, headache or visual disturbances   Complete by:  As directed    Call MD for:  extreme fatigue   Complete by:  As directed    Call MD for:  persistant dizziness or light-headedness   Complete by:  As directed    Diet - low sodium heart healthy   Complete by:  As directed    Discharge instructions   Complete by:  As directed    There is change in your metoprolol medicine. Do not take Eliquis until seen by gastro doctor Take iron tablets as  prescribed or over the counter.   Increase activity slowly   Complete by:  As directed      Allergies as of 01/07/2019   No Known Allergies     Medication List    STOP taking these medications   apixaban 5 MG Tabs tablet Commonly known as:  Eliquis   metoprolol succinate 25 MG 24 hr tablet Commonly known as:  TOPROL-XL     TAKE these medications   allopurinol 300 MG tablet Commonly known as:  ZYLOPRIM Take 300 mg by mouth daily.   buPROPion 100 MG 12 hr tablet Commonly known as:  WELLBUTRIN SR Take 100 mg by mouth 2 (two) times daily.   doxazosin 4 MG tablet Commonly known as:  CARDURA Take 1 tablet (4 mg total) by mouth daily.   ferrous sulfate 325 (65 FE) MG EC tablet Take 1 tablet (325 mg total) by mouth 2 (two) times daily.   lovastatin 40 MG tablet Commonly known as:  MEVACOR Take 40 mg by mouth at bedtime.   metoprolol tartrate 25 MG tablet Commonly known as:  LOPRESSOR Take 1 tablet (25 mg total) by mouth 2 (two) times daily for 30 days.   OXYGEN Inhale 2 L into the lungs at bedtime.   pantoprazole 40 MG tablet Commonly known as:  Protonix Take 1 tablet (40 mg total) by mouth daily for 30 days.   sertraline 100 MG tablet Commonly known as:  ZOLOFT Take 100 mg by mouth daily.   traZODone 100 MG tablet Commonly known as:  DESYREL Take 100 mg by mouth at bedtime.      Follow-up Information    [name], MD Follow up in 2 week(s).   Specialty:  Internal Medicine Contact information: [address] [place] [address] [phone]        Gastroenterology, [facility]. Schedule an appointment as soon as possible for a visit.   Contact information: [place] [address] [phone]          No Known Allergies  Consultations:  Cardiology   Procedures/Studies: X-ray Chest Pa And Lateral  Result Date: 01/05/2019 CLINICAL DATA:  Chronic shortness of breath, history atrial fibrillation, hypertension,  former smoker EXAM: CHEST - 2 VIEW COMPARISON:  07/23/2017 FINDINGS: Upper normal heart size. Mediastinal contours and pulmonary vascularity normal. Bibasilar atelectasis. Upper lungs clear. No acute infiltrate, pleural effusion or pneumothorax. Bones demineralized. IMPRESSION: Bibasilar atelectasis. Electronically Signed   By: Lavonia Dana M.D.   On: 01/05/2019 20:34   Ct Head Wo Contrast  Result Date: 01/05/2019 CLINICAL DATA:  TIA initial exam EXAM: CT HEAD WITHOUT CONTRAST TECHNIQUE: Contiguous axial images were obtained from the base of the skull through the vertex without intravenous contrast. COMPARISON:  None. FINDINGS: Brain: Mild atrophy. Negative for hydrocephalus. Negative for acute infarct, hemorrhage, or mass lesion. Vascular: Negative for hyperdense vessel Skull: Negative Sinuses/Orbits: Paranasal sinuses clear. Bilateral cataract surgery. No orbital lesion. Other: None IMPRESSION: No acute abnormality. Electronically Signed   By: Franchot Gallo M.D.   On: 01/05/2019 18:49   Ct Abdomen Pelvis W Contrast  Result Date: 01/06/2019 CLINICAL DATA:  Anemia. EXAM: CT ABDOMEN AND PELVIS WITH  CONTRAST TECHNIQUE: Multidetector CT imaging of the abdomen and pelvis was performed using the standard protocol following bolus administration of intravenous contrast. CONTRAST:  47mL OMNIPAQUE IOHEXOL 300 MG/ML  SOLN COMPARISON:  CT abdomen pelvis dated June 09, 2012. FINDINGS: Lower chest: Trace right greater than left pleural effusions. Right middle and bilateral lower lobe subsegmental atelectasis. Hepatobiliary: There are several simple cysts in the liver measuring up to 2.1 cm. Additional scattered subcentimeter low-density lesions are too small to characterize, but appear new since 2013. Prior cholecystectomy. Chronic mild common bile duct dilatation, likely due to post cholecystectomy state. Pancreas: Interval increase in size of the cystic lesion in the pancreatic body, now measuring 1.3 cm, previously  7 mm. No ductal dilatation or surrounding inflammatory changes. Spleen: Normal in size without focal abnormality. Adrenals/Urinary Tract: The adrenal glands are unremarkable. Unchanged 2.1 cm hyperdense lesion arising from the upper pole of the right kidney, stable dating back to 2006, likely a hemorrhagic/proteinaceous cyst. Three left renal simple cysts have slightly increased in size, measuring up to 3.7 cm. New small left renal calculi. No hydronephrosis. The bladder is decompressed. Stomach/Bowel: Stomach is distended with ingested contents. History of prior appendectomy. No evidence of bowel wall thickening, distention, or inflammatory changes. Vascular/Lymphatic: Mild increase in aneurysmal dilatation of the infrarenal abdominal aorta, measuring 3.3 cm, previously 3.1 cm. Aortoiliac atherosclerosis. No enlarged abdominal or pelvic lymph nodes. Reproductive: Prostatomegaly. Other: Trace free fluid in the pelvis.  No pneumoperitoneum. Musculoskeletal: No acute or significant osseous findings. IMPRESSION: 1.  No acute intra-abdominal process. 2. Slowly enlarging cystic lesion in the pancreatic body, now measuring 1.3 cm, previously 7 mm in 2013. Given the patient's age, further follow-up of this lesion is not recommended. This recommendation follows ACR consensus guidelines: Management of Incidental Pancreatic Cysts: A White Paper of the ACR Incidental Findings Committee. J Am Coll Radiol 0175;10:258-527. 3. Nonobstructive left nephrolithiasis. 4. Trace right greater than left pleural effusions. 5. Mildly enlarged infrarenal abdominal aortic aneurysm, now 3.3 cm, previously 3.1 cm. Recommend followup by ultrasound in 3 years. This recommendation follows ACR consensus guidelines: White Paper of the ACR Incidental Findings Committee II on Vascular Findings. J Am Coll Radiol 2013; 10:789-794. Aortic aneurysm NOS (ICD10-I71.9) 6.  Aortic atherosclerosis (ICD10-I70.0). Electronically Signed   By: Titus Dubin M.D.    On: 01/06/2019 15:13       Subjective: Patient was seen and examined on the day of discharge.  He was eating breakfast.  Denied any complaints.  He had no bowel movements since being in the hospital.  Denies any nausea or vomiting.  Denies any chest pain.  He was able to ambulate around in the room with a walker and had no dizziness or orthostatic symptoms.  Remains sinus rhythm on telemetry. Call placed to patient's wife and plan discussed.   Discharge Exam: Vitals:   01/06/19 2228 01/07/19 0459  BP: 118/63 (!) 137/94  Pulse: 60 (!) 57  Resp: 20 20  Temp:  (!) 97.5 F (36.4 C)  SpO2: 94% 90%   Vitals:   01/06/19 1747 01/06/19 1950 01/06/19 2228 01/07/19 0459  BP:  108/61 118/63 (!) 137/94  Pulse:  (!) 56 60 (!) 57  Resp:  20 20 20   Temp:  (!) 97.5 F (36.4 C)  (!) 97.5 F (36.4 C)  TempSrc:    Oral  SpO2: 96% 95% 94% 90%  Weight:    89.9 kg  Height:        General: Pt is alert, awake,  not in acute distress Cardiovascular: RRR, S1/S2 +, no rubs, no gallops Respiratory: CTA bilaterally, no wheezing, no rhonchi Abdominal: Soft, NT, ND, bowel sounds + Extremities: no edema, no cyanosis    The results of significant diagnostics from this hospitalization (including imaging, microbiology, ancillary and laboratory) are listed below for reference.     Microbiology: Recent Results (from the past 240 hour(s))  Urine Culture     Status: None   Collection Time: 01/05/19  2:40 PM  Result Value Ref Range Status   Urine Culture, Routine Final report  Final   Organism ID, Bacteria Comment  Final    Comment: Mixed urogenital flora Less than 10,000 colonies/mL      Labs: BNP (last 3 results) No results for input(s): BNP in the last 8760 hours. Basic Metabolic Panel: Recent Labs  Lab 01/05/19 1250 01/05/19 1730 01/06/19 0404 01/07/19 0420  NA 137 137 138 138  K 4.8 4.5 4.3 4.3  CL 106 108 111 109  CO2 18* 21* 21* 22  GLUCOSE 99 119* 102* 97  BUN 44* 47* 39* 29*   CREATININE 1.63* 1.57* 1.43* 1.50*  CALCIUM 8.9 8.7* 8.5* 8.8*  MG 2.1  --   --   --    Liver Function Tests: Recent Labs  Lab 01/05/19 1250 01/05/19 1730  AST 16 21  ALT 14 16  ALKPHOS 40 40  BILITOT 0.3 0.3  PROT 5.9* 6.4*  ALBUMIN 4.3 4.0   No results for input(s): LIPASE, AMYLASE in the last 168 hours. No results for input(s): AMMONIA in the last 168 hours. CBC: Recent Labs  Lab 01/05/19 1250 01/05/19 1730 01/06/19 0404 01/07/19 0420  WBC 5.8 7.1 6.2 6.0  NEUTROABS  --  4.7  --   --   HGB 8.0* 8.2* 7.2* 7.5*  HCT 25.6* 27.4* 23.9* 24.6*  MCV 87 89.8 88.8 87.9  PLT 236 232 224 230   Cardiac Enzymes: No results for input(s): CKTOTAL, CKMB, CKMBINDEX, TROPONINI in the last 168 hours. BNP: Invalid input(s): POCBNP CBG: Recent Labs  Lab 01/06/19 0645 01/07/19 0607  GLUCAP 86 87   D-Dimer No results for input(s): DDIMER in the last 72 hours. Hgb A1c No results for input(s): HGBA1C in the last 72 hours. Lipid Profile No results for input(s): CHOL, HDL, LDLCALC, TRIG, CHOLHDL, LDLDIRECT in the last 72 hours. Thyroid function studies Recent Labs    01/05/19 1250  TSH 2.640   Anemia work up Recent Labs    01/05/19 2059  VITAMINB12 193  FOLATE 9.6  FERRITIN 9*  TIBC 454*  IRON 32*  RETICCTPCT 1.0   Urinalysis    Component Value Date/Time   COLORURINE STRAW (A) 01/05/2019 1804   APPEARANCEUR CLEAR 01/05/2019 1804   APPEARANCEUR Clear 01/05/2019 1440   LABSPEC 1.010 01/05/2019 1804   PHURINE 5.0 01/05/2019 1804   GLUCOSEU NEGATIVE 01/05/2019 1804   HGBUR NEGATIVE 01/05/2019 1804   BILIRUBINUR NEGATIVE 01/05/2019 1804   BILIRUBINUR Negative 01/05/2019 1440   KETONESUR NEGATIVE 01/05/2019 1804   PROTEINUR NEGATIVE 01/05/2019 1804   UROBILINOGEN 1.0 04/03/2011 0150   NITRITE NEGATIVE 01/05/2019 1804   LEUKOCYTESUR NEGATIVE 01/05/2019 1804   Sepsis Labs Invalid input(s): PROCALCITONIN,  WBC,  LACTICIDVEN Microbiology Recent Results (from the  past 240 hour(s))  Urine Culture     Status: None   Collection Time: 01/05/19  2:40 PM  Result Value Ref Range Status   Urine Culture, Routine Final report  Final   Organism ID, Bacteria Comment  Final  Comment: Mixed urogenital flora Less than 10,000 colonies/mL      Time coordinating discharge:32  minutes  SIGNED:   Barb Merino, MD  [facility] 01/07/2019, 9:34 AM Pager [phone]  If 7PM-7AM, please contact night-coverage [email or web address] Password [email or web address]

## 2019-01-12 ENCOUNTER — Encounter (HOSPITAL_COMMUNITY): Payer: Self-pay

## 2019-01-13 ENCOUNTER — Telehealth: Payer: Self-pay | Admitting: Nurse Practitioner

## 2019-01-13 NOTE — Telephone Encounter (Signed)
Spoke with both parties and informed them that per Truitt Merle NP, she wanted to let them know that these changes are ok. Daughter verbalized understanding and agrees with this plan. Daughter gracious for all the assistance provided.

## 2019-01-13 NOTE — Telephone Encounter (Signed)
New Message   Pt c/o medication issue:  1. Name of Medication: doxazosin (CARDURA   2. How are you currently taking this medication (dosage and times per day)? 4MG  changed today to 2mg    3. Are you having a reaction (difficulty breathing--STAT)?  4. What is your medication issue? Patients daughter is calling because he doctor changed the dosage of the doxazosin to 2mg . They added the B12 500 as well as a nasal spray Ipratropium Bromide because he was holdin mucus. She is wanting to know if these changes are okay. Please call.

## 2019-01-13 NOTE — Telephone Encounter (Signed)
Please call and let him know that these changes are ok.

## 2019-01-14 ENCOUNTER — Encounter (HOSPITAL_COMMUNITY): Payer: Self-pay

## 2019-01-19 ENCOUNTER — Encounter (HOSPITAL_COMMUNITY): Payer: Self-pay

## 2019-01-21 ENCOUNTER — Encounter (HOSPITAL_COMMUNITY): Payer: Self-pay

## 2019-01-26 ENCOUNTER — Encounter (HOSPITAL_COMMUNITY): Payer: Self-pay

## 2019-01-28 ENCOUNTER — Encounter (HOSPITAL_COMMUNITY): Payer: Self-pay

## 2019-01-28 ENCOUNTER — Other Ambulatory Visit: Payer: Self-pay | Admitting: Gastroenterology

## 2019-01-29 ENCOUNTER — Other Ambulatory Visit: Payer: Self-pay

## 2019-01-29 ENCOUNTER — Other Ambulatory Visit (HOSPITAL_COMMUNITY)
Admission: RE | Admit: 2019-01-29 | Discharge: 2019-01-29 | Disposition: A | Discharge status: Home / Self Care | Payer: Medicare Other | Source: Ambulatory Visit | Attending: Gastroenterology | Admitting: Gastroenterology

## 2019-01-29 ENCOUNTER — Encounter (HOSPITAL_COMMUNITY): Payer: Self-pay | Admitting: *Deleted

## 2019-01-29 DIAGNOSIS — Z1159 Encounter for screening for other viral diseases: Secondary | ICD-10-CM | POA: Diagnosis present

## 2019-01-29 LAB — SARS CORONAVIRUS 2 BY RT PCR (HOSPITAL ORDER, PERFORMED IN ~~LOC~~ HOSPITAL LAB): SARS Coronavirus 2: NEGATIVE

## 2019-01-29 NOTE — Progress Notes (Addendum)
I spoke with [name], briefly.  [name] had COVID test today.  I asked patient if he was staying in the home and no one who doesn't live there is coming in the house. Drew [name] , my  Family is coming here for [facility], I would not have scheduled procedure if I  had know that. "I am not cancelling my family.   I called [name], the Endo nurse on call; [name] is calling a charge nurse and will call me back.  [name] denies chest pain , patient has shortness of breath. Patient denies any syncope.    [name] called me back and does not have an answer for the patient, she said to tell patient if procedure has to be rescheduled, that someone will notify him this weekend.

## 2019-01-30 NOTE — Progress Notes (Signed)
Due to pt being unwilling to quarantine, procedure cancelled for Monday.  Patient called and stated understanding, he will call office to reschedule.  Dr. Watt Climes notified.  Vista Lawman, RN

## 2019-02-02 ENCOUNTER — Ambulatory Visit (HOSPITAL_COMMUNITY): Admission: RE | Admit: 2019-02-02 | Payer: Medicare Other | Source: Home / Self Care | Admitting: Gastroenterology

## 2019-02-02 ENCOUNTER — Other Ambulatory Visit: Payer: Self-pay | Admitting: Gastroenterology

## 2019-02-02 ENCOUNTER — Encounter (HOSPITAL_COMMUNITY): Payer: Self-pay

## 2019-02-02 HISTORY — DX: Gastro-esophageal reflux disease without esophagitis: K21.9

## 2019-02-02 HISTORY — DX: Depression, unspecified: F32.A

## 2019-02-02 HISTORY — DX: Chronic obstructive pulmonary disease, unspecified: J44.9

## 2019-02-02 HISTORY — DX: Cardiac arrhythmia, unspecified: I49.9

## 2019-02-02 HISTORY — DX: Unspecified osteoarthritis, unspecified site: M19.90

## 2019-02-02 SURGERY — ESOPHAGOGASTRODUODENOSCOPY (EGD) WITH PROPOFOL
Anesthesia: Monitor Anesthesia Care

## 2019-02-03 ENCOUNTER — Telehealth: Payer: Self-pay | Admitting: Pulmonary Disease

## 2019-02-03 DIAGNOSIS — G4733 Obstructive sleep apnea (adult) (pediatric): Secondary | ICD-10-CM

## 2019-02-03 NOTE — Telephone Encounter (Signed)
Compliance report for this patient shows 0% in the last 30 days with 2 minutes activity noted on May 6th and 4 min noted on May 21st.  Contacted [facility] in [place] office who verified they do not see any other activity for past 30 days.  Routed this to [name], NP.

## 2019-02-03 NOTE — Telephone Encounter (Signed)
OK we can change settings to Autoset 10-20 cmH20. This may help. Please let us know if it doesn't help. Thanks.

## 2019-02-03 NOTE — Telephone Encounter (Signed)
Please get me a download. Thanks.

## 2019-02-03 NOTE — Telephone Encounter (Signed)
Contact patient re: [name], NP recommendations to change settings to autoset 10-20cm H20.  Patient acknowledged he has not been using bipap recently but states he will start using it once settings are changed and he will notify us if this does not seem to help.  Order placed.  Nothing further needed.

## 2019-02-03 NOTE — Telephone Encounter (Signed)
Called and spoke with Patient.  Patient stated his bipap settings are 7-20cm and he still feels like he can't breath with his bipap when he lays back to sleep at night. DME is [facility]. Patient last OV 10/07/18, with Dr Ander Slade. Current settings auto 7-20cm H2O.  Message routed to [place], NP to advise on bipap settings

## 2019-02-04 ENCOUNTER — Encounter (HOSPITAL_COMMUNITY): Payer: Self-pay

## 2019-02-05 ENCOUNTER — Other Ambulatory Visit (HOSPITAL_COMMUNITY)
Admission: RE | Admit: 2019-02-05 | Discharge: 2019-02-05 | Disposition: A | Discharge status: Home / Self Care | Payer: Medicare Other | Source: Ambulatory Visit | Attending: Gastroenterology | Admitting: Gastroenterology

## 2019-02-05 DIAGNOSIS — Z1159 Encounter for screening for other viral diseases: Secondary | ICD-10-CM | POA: Diagnosis present

## 2019-02-06 LAB — NOVEL CORONAVIRUS, NAA (HOSP ORDER, SEND-OUT TO REF LAB; TAT 18-24 HRS): SARS-CoV-2, NAA: NOT DETECTED

## 2019-02-08 ENCOUNTER — Other Ambulatory Visit: Payer: Self-pay

## 2019-02-08 ENCOUNTER — Encounter (HOSPITAL_COMMUNITY): Payer: Self-pay | Admitting: *Deleted

## 2019-02-08 NOTE — Progress Notes (Signed)
Spoke with patient, has been self isolating and as not been exposed to any one outside the home since testing.  Confirmed location of procedure for Tuesday.

## 2019-02-09 ENCOUNTER — Ambulatory Visit (HOSPITAL_COMMUNITY): Payer: Medicare Other | Admitting: Certified Registered"

## 2019-02-09 ENCOUNTER — Ambulatory Visit (HOSPITAL_COMMUNITY)
Admission: RE | Admit: 2019-02-09 | Discharge: 2019-02-09 | Disposition: A | Discharge status: Home / Self Care | Payer: Medicare Other | Attending: Gastroenterology | Admitting: Gastroenterology

## 2019-02-09 ENCOUNTER — Encounter (HOSPITAL_COMMUNITY)
Admission: RE | Disposition: A | Discharge status: Home / Self Care | Payer: Self-pay | Source: Home / Self Care | Attending: Gastroenterology

## 2019-02-09 ENCOUNTER — Other Ambulatory Visit: Payer: Self-pay

## 2019-02-09 ENCOUNTER — Encounter (HOSPITAL_COMMUNITY): Payer: Self-pay

## 2019-02-09 DIAGNOSIS — D500 Iron deficiency anemia secondary to blood loss (chronic): Secondary | ICD-10-CM | POA: Diagnosis present

## 2019-02-09 DIAGNOSIS — F329 Major depressive disorder, single episode, unspecified: Secondary | ICD-10-CM | POA: Insufficient documentation

## 2019-02-09 DIAGNOSIS — G4730 Sleep apnea, unspecified: Secondary | ICD-10-CM | POA: Diagnosis not present

## 2019-02-09 DIAGNOSIS — E785 Hyperlipidemia, unspecified: Secondary | ICD-10-CM | POA: Insufficient documentation

## 2019-02-09 DIAGNOSIS — I2510 Atherosclerotic heart disease of native coronary artery without angina pectoris: Secondary | ICD-10-CM | POA: Diagnosis not present

## 2019-02-09 DIAGNOSIS — K449 Diaphragmatic hernia without obstruction or gangrene: Secondary | ICD-10-CM | POA: Insufficient documentation

## 2019-02-09 DIAGNOSIS — Q399 Congenital malformation of esophagus, unspecified: Secondary | ICD-10-CM | POA: Insufficient documentation

## 2019-02-09 DIAGNOSIS — Z86010 Personal history of colonic polyps: Secondary | ICD-10-CM | POA: Diagnosis not present

## 2019-02-09 DIAGNOSIS — Z9842 Cataract extraction status, left eye: Secondary | ICD-10-CM | POA: Diagnosis not present

## 2019-02-09 DIAGNOSIS — I4510 Unspecified right bundle-branch block: Secondary | ICD-10-CM | POA: Insufficient documentation

## 2019-02-09 DIAGNOSIS — Z79899 Other long term (current) drug therapy: Secondary | ICD-10-CM | POA: Insufficient documentation

## 2019-02-09 DIAGNOSIS — I10 Essential (primary) hypertension: Secondary | ICD-10-CM | POA: Insufficient documentation

## 2019-02-09 DIAGNOSIS — Z87891 Personal history of nicotine dependence: Secondary | ICD-10-CM | POA: Insufficient documentation

## 2019-02-09 DIAGNOSIS — K219 Gastro-esophageal reflux disease without esophagitis: Secondary | ICD-10-CM | POA: Insufficient documentation

## 2019-02-09 DIAGNOSIS — N400 Enlarged prostate without lower urinary tract symptoms: Secondary | ICD-10-CM | POA: Diagnosis not present

## 2019-02-09 DIAGNOSIS — K2950 Unspecified chronic gastritis without bleeding: Secondary | ICD-10-CM | POA: Insufficient documentation

## 2019-02-09 DIAGNOSIS — Z9049 Acquired absence of other specified parts of digestive tract: Secondary | ICD-10-CM | POA: Insufficient documentation

## 2019-02-09 DIAGNOSIS — M1990 Unspecified osteoarthritis, unspecified site: Secondary | ICD-10-CM | POA: Insufficient documentation

## 2019-02-09 DIAGNOSIS — K921 Melena: Secondary | ICD-10-CM | POA: Diagnosis not present

## 2019-02-09 HISTORY — PX: BIOPSY: SHX5522

## 2019-02-09 HISTORY — PX: ESOPHAGOGASTRODUODENOSCOPY (EGD) WITH PROPOFOL: SHX5813

## 2019-02-09 SURGERY — ESOPHAGOGASTRODUODENOSCOPY (EGD) WITH PROPOFOL
Anesthesia: Monitor Anesthesia Care

## 2019-02-09 MED ORDER — PROPOFOL 500 MG/50ML IV EMUL
INTRAVENOUS | Status: DC | PRN
  Administered 2019-02-09: 70 ug/kg/min via INTRAVENOUS

## 2019-02-09 MED ORDER — LIDOCAINE HCL (CARDIAC) PF 100 MG/5ML IV SOSY
PREFILLED_SYRINGE | INTRAVENOUS | Status: DC | PRN
  Administered 2019-02-09: 70 mg via INTRATRACHEAL

## 2019-02-09 MED ORDER — PROPOFOL 500 MG/50ML IV EMUL
INTRAVENOUS | Status: DC | PRN
  Administered 2019-02-09: 25 mg via INTRAVENOUS

## 2019-02-09 MED ORDER — ALBUTEROL SULFATE HFA 108 (90 BASE) MCG/ACT IN AERS
INHALATION_SPRAY | RESPIRATORY_TRACT | Status: DC | PRN
  Administered 2019-02-09: 2 via RESPIRATORY_TRACT

## 2019-02-09 MED ORDER — SODIUM CHLORIDE 0.9 % IV SOLN: INTRAVENOUS | Status: DC

## 2019-02-09 MED ORDER — PROPOFOL 10 MG/ML IV BOLUS
INTRAVENOUS | Status: AC
  Filled 2019-02-09: qty 60

## 2019-02-09 MED ORDER — LACTATED RINGERS IV SOLN
INTRAVENOUS | Status: DC
  Administered 2019-02-09: 1000 mL via INTRAVENOUS

## 2019-02-09 SURGICAL SUPPLY — 15 items

## 2019-02-09 NOTE — Anesthesia Preprocedure Evaluation (Signed)
Anesthesia Evaluation  Patient identified by MRN, date of birth, ID band Patient awake    Reviewed: Allergy & Precautions, NPO status , Patient's Chart, lab work & pertinent test results  Airway Mallampati: II  TM Distance: >3 FB Neck ROM: Full    Dental no notable dental hx.    Pulmonary sleep apnea , COPD, former smoker,    Pulmonary exam normal breath sounds clear to auscultation       Cardiovascular hypertension, Pt. on medications negative cardio ROS Normal cardiovascular exam Rhythm:Regular Rate:Normal     Neuro/Psych Depression negative neurological ROS  negative psych ROS   GI/Hepatic Neg liver ROS, GERD  ,  Endo/Other  negative endocrine ROS  Renal/GU negative Renal ROS  negative genitourinary   Musculoskeletal  (+) Arthritis , Osteoarthritis,    Abdominal   Peds negative pediatric ROS (+)  Hematology negative hematology ROS (+)   Anesthesia Other Findings   Reproductive/Obstetrics negative OB ROS                             Anesthesia Physical Anesthesia Plan  ASA: III  Anesthesia Plan: MAC   Post-op Pain Management:    Induction: Intravenous  PONV Risk Score and Plan: 1 and Ondansetron  Airway Management Planned: Nasal Cannula  Additional Equipment:   Intra-op Plan:   Post-operative Plan:   Informed Consent: I have reviewed the patients History and Physical, chart, labs and discussed the procedure including the risks, benefits and alternatives for the proposed anesthesia with the patient or authorized representative who has indicated his/her understanding and acceptance.     Dental advisory given  Plan Discussed with: CRNA  Anesthesia Plan Comments:         Anesthesia Quick Evaluation

## 2019-02-09 NOTE — Op Note (Signed)
[facility] Patient Name: Elis Sauber Procedure Date: 02/09/2019 MRN: [id number] Attending MD: Clarene Essex , MD Date of Birth: 12/09/1933 CSN: [id number] Age: 85 Admit Type: Outpatient Procedure:                Upper GI endoscopy Indications:              Iron deficiency anemia secondary to chronic blood                            loss, Heme positive stool Providers:                Clarene Essex, MD, Grace Isaac, RN, William Dalton,                            Technician Referring MD:              Medicines:                Propofol total dose 100 mg IV, 80 mg lidocaine Complications:            No immediate complications. Estimated Blood Loss:     Estimated blood loss: none. Procedure:                Pre-Anesthesia Assessment:                           - Prior to the procedure, a History and Physical                            was performed, and patient medications and                            allergies were reviewed. The patient's tolerance of                            previous anesthesia was also reviewed. The risks                            and benefits of the procedure and the sedation                            options and risks were discussed with the patient.                            All questions were answered, and informed consent                            was obtained. Prior Anticoagulants: The patient has                            taken no previous anticoagulant or antiplatelet                            agents. ASA Grade Assessment: III - A patient with  severe systemic disease. After reviewing the risks                            and benefits, the patient was deemed in                            satisfactory condition to undergo the procedure.                           After obtaining informed consent, the endoscope was                            passed under direct vision. Throughout the   procedure, the patient's blood pressure, pulse, and                            oxygen saturations were monitored continuously. The                            GIF-H190 ([id number]) Olympus gastroscope was                            introduced through the mouth, and advanced to the                            fourth part of duodenum. The upper GI endoscopy was                            accomplished without difficulty. The patient                            tolerated the procedure well. The upper GI                            endoscopy was accomplished without difficulty. The                            patient tolerated the procedure well. Scope In: Scope Out: Findings:      The larynx was normal.      A small hiatal hernia was present.      The middle third of the esophagus and lower third of the esophagus were       mildly tortuous and dilated.      Localized mild inflammation characterized by adherent flecks of probable       blood, congestion (edema) and erythema and adherent mucus was found in       the gastric antrum. Biopsies were taken with a cold forceps for       histology.      The duodenal bulb, first portion of the duodenum, second portion of the       duodenum, third portion of the duodenum and fourth portion of the       duodenum were normal.      The exam was otherwise without abnormality. Impression:               - Normal larynx.                           -  Small hiatal hernia.                           - Tortuous esophagus.                           - Chronic gastritis. Biopsied.                           - Normal duodenal bulb, first portion of the                            duodenum, second portion of the duodenum, third                            portion of the duodenum and fourth portion of the                            duodenum.                           - The examination was otherwise normal. Moderate Sedation:      Not Applicable - Patient had care per  Anesthesia. Recommendation:           - Patient has a contact number available for                            emergencies. The signs and symptoms of potential                            delayed complications were discussed with the                            patient. Return to normal activities tomorrow.                            Written discharge instructions were provided to the                            patient.                           - Soft diet today.                           - Continue present medications. Reevaluate needs of                            anticoagulation or antiplatelet agent going forward                           - Await pathology results.                           - Return to GI clinic after studies are complete.  Await primary care repeat labs possibly to be done                            later this week                           - Telephone GI clinic for pathology results in 1                            week.                           - Telephone GI clinic if symptomatic PRN. Consider                            colonoscopy next Procedure Code(s):        --- Professional ---                           [phone], Esophagogastroduodenoscopy, flexible,                            transoral; with biopsy, single or multiple Diagnosis Code(s):        --- Professional ---                           K44.9, Diaphragmatic hernia without obstruction or                            gangrene                           Q39.9, Congenital malformation of esophagus,                            unspecified                           K29.50, Unspecified chronic gastritis without                            bleeding                           D50.0, Iron deficiency anemia secondary to blood                            loss (chronic)                           R19.5, Other fecal abnormalities CPT copyright 2019 American Medical Association. All rights reserved. The  codes documented in this report are preliminary and upon coder review may  be revised to meet current compliance requirements. Clarene Essex, MD 02/09/2019 8:43:32 AM This report has been signed electronically. Number of Addenda: 0

## 2019-02-09 NOTE — Progress Notes (Signed)
[name] 8:12 AM  Subjective: Patient without any GI complaints or signs of bleeding and no new medical problems since he was evaluated on telehealth  Objective: Vital signs stable afebrile no acute distress exam please see preassessment evaluation  Assessment: Guaiac positive anemia  Plan: Okay to proceed with endoscopy today with anesthesia assistance  [facility]  office [phone] After 5PM or if no answer call [phone]

## 2019-02-09 NOTE — Anesthesia Postprocedure Evaluation (Signed)
Anesthesia Post Note  [name]  Procedure(s) Performed: ESOPHAGOGASTRODUODENOSCOPY (EGD) WITH PROPOFOL (N/A ) BIOPSY     Patient location during evaluation: Endoscopy Anesthesia Type: MAC Level of consciousness: awake and alert Pain management: pain level controlled Vital Signs Assessment: post-procedure vital signs reviewed and stable Respiratory status: spontaneous breathing, nonlabored ventilation and respiratory function stable Cardiovascular status: stable and blood pressure returned to baseline Postop Assessment: no apparent nausea or vomiting Anesthetic complications: no    Last Vitals:  Vitals:   02/09/19 0843 02/09/19 0900  BP: (!) 157/76 (!) 153/80  Pulse: 83 83  Resp: (!) 24 (!) 29  Temp:    SpO2: (!) 89% 94%    Last Pain:  Vitals:   02/09/19 0900  TempSrc:   PainSc: 0-No pain                 Lynda Rainwater

## 2019-02-09 NOTE — Discharge Instructions (Signed)
YOU HAD AN ENDOSCOPIC PROCEDURE TODAY: Refer to the procedure report and other information in the discharge instructions given to you for any specific questions about what was found during the examination. If this information does not answer your questions, please call [facility] office at [phone] to clarify.   YOU SHOULD EXPECT: Some feelings of bloating in the abdomen. Passage of more gas than usual. Walking can help get rid of the air that was put into your GI tract during the procedure and reduce the bloating. If you had a lower endoscopy (such as a colonoscopy or flexible sigmoidoscopy) you may notice spotting of blood in your stool or on the toilet paper. Some abdominal soreness may be present for a day or two, also.  DIET: Your first meal following the procedure should be a light meal and then it is ok to progress to your normal diet. A half-sandwich or bowl of soup is an example of a good first meal. Heavy or fried foods are harder to digest and may make you feel nauseous or bloated. Drink plenty of fluids but you should avoid alcoholic beverages for 24 hours. If you had a esophageal dilation, please see attached instructions for diet.   ACTIVITY: Your care partner should take you home directly after the procedure. You should plan to take it easy, moving slowly for the rest of the day. You can resume normal activity the day after the procedure however YOU SHOULD NOT DRIVE, use power tools, machinery or perform tasks that involve climbing or major physical exertion for 24 hours (because of the sedation medicines used during the test).   SYMPTOMS TO REPORT IMMEDIATELY: A gastroenterologist can be reached at any hour. Please call [phone]  for any of the following symptoms:   Following lower endoscopy (colonoscopy, flexible sigmoidoscopy) Excessive amounts of blood in the stool  Significant tenderness, worsening of abdominal pains  Swelling of the abdomen that is new, acute  Fever of 100  or higher    FOLLOW UP:  If any biopsies were taken you will be contacted by phone or by letter within the next 1-3 weeks. Call [phone]  if you have not heard about the biopsies in 3 weeks.  Please also call with any specific questions about appointments or follow up tests. Call if question or problem otherwise soft solids first meal today and call for biopsy report in 1 week and asked Dr. Buel Ream office to send me your follow-up lab work from this week and then we will decide when to see you back and decide if colonoscopy is needed

## 2019-02-09 NOTE — Progress Notes (Signed)
Pt. Room air O2 in high 80's, Wife advised to bring his PRN O2 from home for car ride home. Pt reports using 2L [place] PRN with activity. When pt placed on 2L [place] in recovery O2 sat. 95%. Pt agrees to wear home O2 and check O2 saturation with his home finger monitor. States he usually reads at 94%.

## 2019-02-09 NOTE — Transfer of Care (Addendum)
Immediate Anesthesia Transfer of Care Note  [name]  Procedure(s) Performed: ESOPHAGOGASTRODUODENOSCOPY (EGD) WITH PROPOFOL (N/A ) BIOPSY  Patient Location: PACU and Endoscopy Unit  Anesthesia Type:MAC  Level of Consciousness: awake, drowsy and patient cooperative  Airway & Oxygen Therapy: Patient Spontanous Breathing and Patient connected to face mask oxygen  Post-op Assessment: Report given to RN and Post -op Vital signs reviewed and stable  Post vital signs: Reviewed and stable  Last Vitals:  Vitals Value Taken Time  BP 138/64 02/09/2019  8:40 AM  Temp 36.5 C 02/09/2019  8:40 AM  Pulse 82 02/09/2019  8:40 AM  Resp 28 02/09/2019  8:40 AM  SpO2 96 % 02/09/2019  8:40 AM    Last Pain:  Vitals:   02/09/19 0840  TempSrc: Oral  PainSc: 0-No pain         Complications: No apparent anesthesia complications

## 2019-02-10 ENCOUNTER — Encounter (HOSPITAL_COMMUNITY): Payer: Self-pay | Admitting: Gastroenterology

## 2019-02-12 ENCOUNTER — Telehealth: Payer: Self-pay | Admitting: Pulmonary Disease

## 2019-02-12 NOTE — Telephone Encounter (Signed)
lmtcb x1 pt 

## 2019-02-15 NOTE — Telephone Encounter (Signed)
Verified with [facility] Pressure was changed on 02/05/19. Pt is with [facility] Millin [phone]. Pt notified if he has has anymore trouble he may contact them. Nothing further needed.

## 2019-02-15 NOTE — Telephone Encounter (Signed)
Pt is returning call. CB is [phone]

## 2019-02-17 ENCOUNTER — Telehealth: Payer: Self-pay | Admitting: *Deleted

## 2019-02-17 NOTE — Telephone Encounter (Signed)
Pt states that he has not resumed his Eliquis

## 2019-02-17 NOTE — Telephone Encounter (Signed)
Noted that he has not resumed his Eliquis. Has virtual visit with me next week. Will keep as planned.   Cecille Rubin

## 2019-02-17 NOTE — Telephone Encounter (Signed)
Eliquis was placed on hold for GI eval. It does appear that pt had GI eval on 02/09/19, but do not see that Eliquis was addressed at discharge after admission. Will route to Truitt Merle, NP as last provider to see pt and Dr. [name] to address resumption/clearance.

## 2019-02-17 NOTE — Telephone Encounter (Signed)
Can we please call him and ask if his Eliquis was ever resumed?  I have virtual visit with him next week and we can discuss the clearance at that time.

## 2019-02-17 NOTE — Telephone Encounter (Signed)
   [facility] Pre-operative Risk Assessment    Request for surgical clearance:  1. What type of surgery is being performed? B/L UPPER EYELID BLEPHAROPLASTY with B/L LOWER EYELID ECTROPION REPAIR and CONJUNCTIVOPLASTY   2. When is this surgery scheduled? 03/01/19   3. What type of clearance is required (medical clearance vs. Pharmacy clearance to hold med vs. Both)? BOTH ?  4. Are there any medications that need to be held prior to surgery and how long? SURGEON LISTED TO HOLD ELIQUIS THOUGH I DO NOT SEE ELIQUIS ON MED LIST?   5. Practice name and name of physician performing surgery? [facility];  DR. Laverda Sorenson  6. What is your office phone number [phone]    7.   What is your office fax number [phone]  8.   Anesthesia type (None, local, MAC, general) ? MAC   [name] 02/17/2019, 11:46 AM  _________________________________________________________________   (provider comments below)

## 2019-02-18 ENCOUNTER — Other Ambulatory Visit: Payer: Self-pay | Admitting: Gastroenterology

## 2019-02-19 ENCOUNTER — Telehealth: Payer: Self-pay | Admitting: Nurse Practitioner

## 2019-02-19 NOTE — Telephone Encounter (Signed)
Please make tele-visit for next week. Thanks.

## 2019-02-19 NOTE — Telephone Encounter (Signed)
Spoke with pt and made televisit for 02/22/19 at 930 with TN.  Nothing further is needed

## 2019-02-19 NOTE — Telephone Encounter (Signed)
Pt called on 5/27 c/o of cpap pressure being to low. An order was placed and pressure was adjusted to 10-20 on 5/27. Pt called back again on 6/5 c/o about pressures not being changed. Assured pt per Raven with [facility] pressure had been changed. Pt calling again today stating pressure are still to low. Please advise.  Looked in [place] no download Pt had been advised he needs to try to wear. He says he can't tolerate more than 5-10 mins.

## 2019-02-22 ENCOUNTER — Ambulatory Visit (INDEPENDENT_AMBULATORY_CARE_PROVIDER_SITE_OTHER): Payer: Medicare Other | Admitting: Nurse Practitioner

## 2019-02-22 ENCOUNTER — Telehealth: Payer: Self-pay | Admitting: General Surgery

## 2019-02-22 ENCOUNTER — Other Ambulatory Visit: Payer: Self-pay

## 2019-02-22 ENCOUNTER — Encounter: Payer: Self-pay | Admitting: Nurse Practitioner

## 2019-02-22 DIAGNOSIS — G4733 Obstructive sleep apnea (adult) (pediatric): Secondary | ICD-10-CM | POA: Diagnosis not present

## 2019-02-22 NOTE — Progress Notes (Signed)
Virtual Visit via Telephone Note  I connected with [name] on 02/22/19 at  9:30 AM EDT by telephone and verified that I am speaking with the correct person using two identifiers.  Location: Patient: home Provider: office   I discussed the limitations, risks, security and privacy concerns of performing an evaluation and management service by telephone and the availability of in person appointments. I also discussed with the patient that there may be a patient responsible charge related to this service. The patient expressed understanding and agreed to proceed.   History of Present Illness: 85 year old male former smoker with OSA who is followed by Dr. Ander Slade.  Patient has a tele-visit today for follow-up on CPAP.  It is new CPAP earlier this year and states that he feels his pressure is too low on the machine.  He has not used a new machine because of this.  Still using his old machine at this time.  States that he does use his old machine every night.  He does not have an SD card for download and does not know the pressure settings on his old machine.  He is not needing new supplies and is having issues with his mask. Patient does use O2 at 3L at night and as needed throughout the day. Denies f/c/s, n/v/d, hemoptysis, PND, leg swelling.     Observations/Objective:  HST 10/27/18 - AHI 5.3 SpO2 low at 77%  Assessment and Plan: History of obstructive sleep apnea -History of obstructive sleep apnea -Most recent titration study was a suboptimal as patient could not sleep at all while he was in the sleep lab -States he is very compliant with CPAP use. He is currently using his old machine because he feels the pressure needs to be increased on new machine. He does not know what the settings are on the old machine.  -CPAP does help his symptoms -will ask DME to check settings on both machines and let us know so that we can adjust settings accordingly.   Chronic obstructive pulmonary  disease -On bronchodilators -Follows up with Dr. [name]  Chronic respiratory failure -Continues on oxygen  Patient Instructions  Patient continues to benefit from CPAP with good compliance Will order DME to check settings on both machines Continue current medications Goal of 4 hours or more usage per night Maintain healthy weight Do not drive if drowsy   Follow Up Instructions:  Follow up with Dr. Ander Slade in 1 month or sooner if needed   I discussed the assessment and treatment plan with the patient. The patient was provided an opportunity to ask questions and all were answered. The patient agreed with the plan and demonstrated an understanding of the instructions.   The patient was advised to call back or seek an in-person evaluation if the symptoms worsen or if the condition fails to improve as anticipated.  I provided 22 minutes of non-face-to-face time during this encounter.   Fenton Foy, NP

## 2019-02-22 NOTE — Telephone Encounter (Signed)
Agreed.  Thanks.  

## 2019-02-22 NOTE — Telephone Encounter (Signed)
Called [employer] [phone] option #2. Advised patient of need to get the settings on old CPAP if available. Rep, Nicole Kindred with [employer] looked back as 2010 and could only find information related to patient oxygen with air pressure of 7, but could not confirm when that order was processed or received. It was downloaded into their system as of 11/06/18, but that was due to [facility] and [employer] merger.  Call transferred to respiratory department. Had to LVM requesting call back. Advised the CPAP was over 34 years old. Patient as of January 2020 changed DME companies and has not been compliant with usage of new CPAP machine. As the patient stated the setting was not strong enough. We need to know what the setting was for the old machine in order to help better determine what will work for the patient.

## 2019-02-22 NOTE — Telephone Encounter (Signed)
Called [facility] and confirmed the patient was set up with their [facility] office. Rep stated that on 02/05/19 the therapist changed CPAP setting to Auto 10-20.  I told rep the patient stated during his telephone visit he stated he was using the old machine, because the new machine was not working for him because it was too strong. So he went back to using the old machine. Advised we are not able to pull up that information in [place]. Patient stated the SD card was taken so it was not available to drop off at our clinic for download.   [facility] will send message to therapist to ask if they can go to the patient home and check both machines and set the new machine to the settings he is currently using on the old machine. They are to send a faxed updated usage report to the attention of Drew Arms, NP.  Message above forward to Drew Arms, NP as an [place].

## 2019-02-22 NOTE — Telephone Encounter (Signed)
Returned call to Manuela Schwartz with [employer]. She stated the new machine was originally set at 5-15 and the patient stated the pressure was not enough. It was then adjusted to 10-20.  When she went to the patient's home today he stated the pressure was not enough. Manuela Schwartz said she turned the ram time completely off to see if that would make a difference.   Manuela Schwartz did state the patient has not been compliant with usage of the machine and she instructed him that the new CPAP has to be used otherwise insurance will not cover it and it will have to be turned into [place]. She said that if the patient loses the new machine and then decides he wants to start using a new CPAP he will have to the process all over again, meaning a new sleep study.  Message above routed to Lazaro Arms, NP to advise.

## 2019-02-22 NOTE — Telephone Encounter (Signed)
I needed them to check the pressure setting on the old machine that he is using nightly so that I could adjust pressure on new machine. What are the pressure settings on his old machine?

## 2019-02-22 NOTE — Assessment & Plan Note (Signed)
History of obstructive sleep apnea -History of obstructive sleep apnea -Most recent titration study was a suboptimal as patient could not sleep at all while he was in the sleep lab -States he is very compliant with CPAP use. He is currently using his old machine because he feels the pressure needs to be increased on new machine. He does not know what the settings are on the old machine.  -CPAP does help his symptoms -will ask DME to check settings on both machines and let us know so that we can adjust settings accordingly.   Chronic obstructive pulmonary disease -On bronchodilators -Follows up with Dr. [name]  Chronic respiratory failure -Continues on oxygen  Patient Instructions  Patient continues to benefit from CPAP with good compliance Will order DME to check settings on both machines Continue current medications Goal of 4 hours or more usage per night Maintain healthy weight Do not drive if drowsy  Follow up with Dr. Ander Slade in 1 month or sooner if needed

## 2019-02-22 NOTE — Patient Instructions (Signed)
Patient continues to benefit from CPAP with good compliance Will order DME to check settings on both machines Continue current medications Goal of 4 hours or more usage per night Maintain healthy weight Do not drive if drowsy  Follow up with Dr. Ander Slade in 1 month or sooner if needed

## 2019-02-23 ENCOUNTER — Telehealth: Payer: Self-pay | Admitting: *Deleted

## 2019-02-23 NOTE — Progress Notes (Signed)
Telehealth Visit     Virtual Visit via Video Note   This visit type was conducted due to national recommendations for restrictions regarding the COVID-19 Pandemic (e.g. social distancing) in an effort to limit this patient's exposure and mitigate transmission in our community.  Due to his co-morbid illnesses, this patient is at least at moderate risk for complications without adequate follow up.  This format is felt to be most appropriate for this patient at this time.  All issues noted in this document were discussed and addressed.  A limited physical exam was performed with this format.  Please refer to the patient's chart for his consent to telehealth for [facility].   Evaluation Performed:  Follow-up visit  This visit type was conducted due to national recommendations for restrictions regarding the COVID-19 Pandemic (e.g. social distancing).  This format is felt to be most appropriate for this patient at this time.  All issues noted in this document were discussed and addressed.  No physical exam was performed (except for noted visual exam findings with Video Visits).  Please refer to the patient's chart (MyChart message for video visits and phone note for telephone visits) for the patient's consent to telehealth for [facility].  Date:  02/24/2019   ID:  [name], DOB Jul 31, 1934, MRN [id number]  Patient Location:  Home  Provider location:   Home  PCP:  [name], MD  Cardiologist:  [place] Electrophysiologist:  Thompson Grayer, MD   Chief Complaint:  Follow up visit.   History of Present Illness:    [name] is a 85 y.o. male who presents via audio/video conferencing for a telehealth visit today.  Seen for Dr. [name]. He has seen Dr. [name] in the remote past.Primarily follows with me.  He has a h/o PVC ablation 05/29/10, PAF, obesity, sedentary lifestyle, HLD, gout, OSA treated with CPAP, &dysfunctional diaphragm with chronic  dyspneapreviouslyfollowed by Dr. [name]- now with Dr. [name]. He has mild CAD per cath back in August of 2011. Labs are checked by primary care.   Seen back in February of 2016 by Dr. [name] - this was for evaluation of dyspnea. Dr. [name] wanted cardiac status reevaluated to assess for any cardiac issues that may be contributing to dyspnea. Was referred for Myoview and echocardiogram. These studies were satisfactory. Encouraged to cut back on his alcohol intake as well.   I have followed him since - he has been holding his own - dyspnea is chronic.He has gotten back with pulmonary - now seeing Dr. [name].Balance issues and has had some falls.Has ended up having documented AF - was placed on anticoagulation - initially with Eliquis - but then transitioned to Coumadin due to costand then back to Eliquis due to labile INRs. Has also needed some regulation of his BP medicines. When seen in the office in March, he was doing pretty well.   Then had several phone calls prior to last telehealth visit in April - BP low - had to cut back lots of his medicines. Was having tingling in his back and shoulders. At the virtual visit, his wife and daughter were present - they had lots of concerns (on top of his chronic depression) - getting weak, did not look good - dizzy - I ended up sending him to the office for labs - marked anemia noted - he was admitted and transfused and referred to GI. Eliquis was stopped.   The patient does not have symptoms concerning for COVID-19 infection (  fever, chills, cough, or new shortness of breath).   Seen today via Doximity video. He has consented for this visit. He is doing ok. Having colonoscopy on Tuesday. Using a walker. Not dizzy. Remains off Eliquis. Was for eye surgery next week too - this has been postponed. No bleeding that he is aware of. Labs checked by PCP per his report. No chest pain. Breathing is stable. He is not really getting out. Rarely outside. Walking  some with his walker. BP is fluctuating - he is not sure if his cuff is right. His son has been moved back to [place] to continue rehab at home from his stroke.   Past Medical History:  Diagnosis Date  . Arthritis   . BPH (benign prostatic hyperplasia)   . COPD (chronic obstructive pulmonary disease) ([place])   . Coronary artery ectasia    Mild CAD with normal systolic function per cath in August of 2011  . Depression   . Diaphragmatic paralysis    felt to be partially responsible for dyspnea  . Dyspnea    due to paralyzed diaphragm and pulmonary issues  . Dysrhythmia   . GERD (gastroesophageal reflux disease)   . Gout   . HTN (hypertension)   . Hypercholesteremia   . Microhematuria   . Obesity   . OSA (obstructive sleep apnea)    CPAP  . Paroxysmal atrial fibrillation (HCC)    occured in the setting of acute E Coli sepsis and ileius 7/12  . PVC (premature ventricular contraction)    s/p PVC ablation 05/29/2010   Past Surgical History:  Procedure Laterality Date  . APPENDECTOMY  1978  . BIOPSY  02/09/2019   Procedure: BIOPSY;  Surgeon: Clarene Essex, MD;  Location: [facility];  Service: Endoscopy;;  . CARDIAC CATHETERIZATION  04-30-2010   Left main coronary artery is normal.   . CARDIOVASCULAR STRESS TEST  03-19-2010   0%  . CHOLECYSTECTOMY  1990  . COLONOSCOPY W/ POLYPECTOMY    . ESOPHAGOGASTRODUODENOSCOPY (EGD) WITH PROPOFOL N/A 02/09/2019   Procedure: ESOPHAGOGASTRODUODENOSCOPY (EGD) WITH PROPOFOL;  Surgeon: Clarene Essex, MD;  Location: [facility];  Service: Endoscopy;  Laterality: N/A;  . EYE SURGERY Right    catarct  . poly removed from nose as a child    . PVC ablation  05/29/2010  . US ECHOCARDIOGRAPHY  03-09-2010   EF 55-60%     Current Meds  Medication Sig  . allopurinol (ZYLOPRIM) 300 MG tablet Take 300 mg by mouth daily.    Marland Kitchen buPROPion (WELLBUTRIN SR) 100 MG 12 hr tablet Take 100 mg by mouth 2 (two) times daily.  Marland Kitchen docusate sodium (COLACE) 100 MG capsule Take 100  mg by mouth daily as needed for constipation.  Marland Kitchen doxazosin (CARDURA) 4 MG tablet Take 2 mg by mouth daily.  . ferrous sulfate 325 (65 FE) MG tablet Take 325 mg by mouth daily with breakfast.  . ipratropium (ATROVENT) 0.03 % nasal spray Place 1 spray into both nostrils 2 (two) times daily as needed for congestion.  . lovastatin (MEVACOR) 40 MG tablet Take 40 mg by mouth at bedtime.    . metoprolol tartrate (LOPRESSOR) 25 MG tablet Take 1 tablet (25 mg total) by mouth 2 (two) times daily for 30 days.  . OXYGEN Inhale 2 L into the lungs at bedtime.  . pantoprazole (PROTONIX) 40 MG tablet Take 40 mg by mouth daily.  . sertraline (ZOLOFT) 100 MG tablet Take 100 mg by mouth daily.  . traZODone (DESYREL) 100  MG tablet Take 100 mg by mouth at bedtime.    . vitamin B-12 (CYANOCOBALAMIN) 500 MCG tablet Take 500 mcg by mouth daily.  . [DISCONTINUED] doxazosin (CARDURA) 4 MG tablet Take 1 tablet (4 mg total) by mouth daily. (Patient taking differently: Take 2 mg by mouth daily. )  . [DISCONTINUED] ferrous sulfate 325 (65 FE) MG EC tablet Take 1 tablet (325 mg total) by mouth 2 (two) times daily. (Patient taking differently: Take 325 mg by mouth daily. )     Allergies:   Patient has no known allergies.   Social History   Tobacco Use  . Smoking status: Former Smoker    Packs/day: 3.00    Years: 20.00    Pack years: 60.00    Types: Cigarettes    Quit date: 09/09/1984    Years since quitting: 34.4  . Smokeless tobacco: Never Used  Substance Use Topics  . Alcohol use: Yes    Alcohol/week: 7.0 standard drinks    Types: 7 Shots of liquor per week  . Drug use: No     Family Hx: The patient's family history includes Aneurysm (age of onset: 70) in his father; Breast cancer in his daughter and mother.  ROS:   Please see the history of present illness.   All other systems reviewed are negative.    Objective:    Vital Signs:  BP (!) 147/73   Pulse (!) 54   Ht 5\' 10"  (1.778 m)   Wt 199 lb (90.3  kg)   BMI 28.55 kg/m    Wt Readings from Last 3 Encounters:  02/24/19 199 lb (90.3 kg)  02/09/19 200 lb (90.7 kg)  01/07/19 198 lb 3.1 oz (89.9 kg)    Alert male in no acute distress. He looks better today. Not short of breath with conversation.    Labs/Other Tests and Data Reviewed:    Lab Results  Component Value Date   WBC 6.0 01/07/2019   HGB 7.5 (L) 01/07/2019   HCT 24.6 (L) 01/07/2019   PLT 230 01/07/2019   GLUCOSE 97 01/07/2019   ALT 16 01/05/2019   AST 21 01/05/2019   NA 138 01/07/2019   K 4.3 01/07/2019   CL 109 01/07/2019   CREATININE 1.50 (H) 01/07/2019   BUN 29 (H) 01/07/2019   CO2 22 01/07/2019   TSH 2.640 01/05/2019   INR 1.3 (H) 01/05/2019       BNP (last 3 results) No results for input(s): BNP in the last 8760 hours.  ProBNP (last 3 results) No results for input(s): PROBNP in the last 8760 hours.    Prior CV studies:    The following studies were reviewed today:  Holter 02/2018 Narrative & Impression    Sinus rhythm with first degree AV block, Right bundle branch block Average heart rate is 62 bpm Occasional premature ventricular contractions Frequent premature atrial contractions with nonsustained atrial tachycardia Nonsustained atrial fibrillation also observed Nocturnal bradycardia with mobitz I second degree AV block is noted transiently No sustained arrhythmias No prolonged pauses      Myoview Impression from March 2016 Exercise Capacity: Lexiscan with no exercise. BP Response: Normal blood pressure response. Clinical Symptoms: Nausea. ECG Impression: Atrial fibrillation, no changes from baseline.  Comparison with Prior Nuclear Study: No images to compare  Overall Impression: Low risk stress nuclear study a medium-sized basal inferior and basal to mid inferolateral perfusion defect that is actually worse at rest than with stress. No ischemia. Given normal wall motion, this may  represent diaphragmatic attenuation. .   LV Ejection Fraction: 63%. LV Wall Motion: NL LV Function; NL Wall Motion   Loralie Champagne 11/07/2014   Echo Study Conclusions from February 2016  - Left ventricle: The cavity size was normal. Wall thickness was increased in a pattern of mild LVH. Systolic function was normal. The estimated ejection fraction was in the range of 60% to 65%. Wall motion was normal; there were no regional wall motion abnormalities. - Aortic valve: There was trivial regurgitation. - Left atrium: The atrium was mildly dilated. - Right atrium: The atrium was mildly dilated.    ASSESSMENT & PLAN:    1. Recent GI bleed - off Eliquis - for colonoscopy next week. Further disposition pending.   2. Pre op clearance for eye surgery - this has been postponed.   3. HTN - has had multiple medicines stopped previously due to GI bleed/anemia - probably going to need to start adding back in the future. We will need to check his cuff for accuracy. I will see him back week after next for further disposition.   4. PAF - noted onpriorHolter - his CHADSVASC is at least 20 (age, HTN, CAD)-he was initially placed on Eliquis -switched toCoumadin due to costand then back to Eliquis due to marked lability and then stopped due to marked anemia/bleeding. Remains off at this time.   5. Chronic dyspnea - He hadcardiac studiesback [id number] whichwere stable.Still can't return to pulmonary rehab due to COVID  6. PVCs - past ablation- remains on low dose Toprol.Not endorsed.   7. Obesity -weight is stable.   8. Depression - long standing issue  9. HLD -on statin therapy.  10. CAD -no active chest pain. Medical management.   11. Prior chronic anticoagulation - remains off at this time. Will need to decide about resuming after colonoscopy is done.    12. COVID-19 Education: The signs and symptoms of COVID-19 were discussed with the patient and how to seek care for testing (follow up with  PCP or arrange E-visit).  The importance of social distancing, staying at home, hand hygiene and wearing a mask when out in public were discussed today.  Patient Risk:   After full review of this patient's clinical status, I feel that they are at least moderate risk at this time.  Time:   Today, I have spent 9 minutes with the patient with telehealth technology discussing the above issues.     Medication Adjustments/Labs and Tests Ordered: Current medicines are reviewed at length with the patient today.  Concerns regarding medicines are outlined above.   Tests Ordered: No orders of the defined types were placed in this encounter.   Medication Changes: No orders of the defined types were placed in this encounter.   Disposition:  FU with me in about 2 weeks in the office.   Patient is agreeable to this plan and will call if any problems develop in the interim.   Amie Critchley, NP  02/24/2019 9:15 AM    [place]

## 2019-02-23 NOTE — Telephone Encounter (Signed)
lvm for upcoming telehealth visit with instructions.  If pt cannot keep appt instructed to r/s appt.  Pt will need consent in chart tomorrow.

## 2019-02-24 ENCOUNTER — Other Ambulatory Visit: Payer: Self-pay

## 2019-02-24 ENCOUNTER — Encounter: Payer: Self-pay | Admitting: Nurse Practitioner

## 2019-02-24 ENCOUNTER — Telehealth (INDEPENDENT_AMBULATORY_CARE_PROVIDER_SITE_OTHER): Payer: Medicare Other | Admitting: Nurse Practitioner

## 2019-02-24 VITALS — BP 147/73 | HR 54 | Ht 70.0 in | Wt 199.0 lb

## 2019-02-24 DIAGNOSIS — Z7189 Other specified counseling: Secondary | ICD-10-CM

## 2019-02-24 DIAGNOSIS — I480 Paroxysmal atrial fibrillation: Secondary | ICD-10-CM

## 2019-02-24 DIAGNOSIS — I259 Chronic ischemic heart disease, unspecified: Secondary | ICD-10-CM | POA: Diagnosis not present

## 2019-02-24 DIAGNOSIS — R0602 Shortness of breath: Secondary | ICD-10-CM

## 2019-02-24 DIAGNOSIS — I10 Essential (primary) hypertension: Secondary | ICD-10-CM

## 2019-02-24 DIAGNOSIS — I493 Ventricular premature depolarization: Secondary | ICD-10-CM

## 2019-02-24 DIAGNOSIS — K922 Gastrointestinal hemorrhage, unspecified: Secondary | ICD-10-CM

## 2019-02-24 DIAGNOSIS — Z79899 Other long term (current) drug therapy: Secondary | ICD-10-CM

## 2019-02-24 NOTE — Telephone Encounter (Signed)
Virtual Visit Pre-Appointment Phone Call  "(Name), I am calling you today to discuss your upcoming appointment. We are currently trying to limit exposure to the virus that causes COVID-19 by seeing patients at home rather than in the office."  1. "What is the BEST phone number to call the day of the visit?" - include this in appointment notes  2. "Do you have or have access to (through a family member/friend) a smartphone with video capability that we can use for your visit?" a. If yes - list this number in appt notes as "cell" (if different from BEST phone #) and list the appointment type as a VIDEO visit in appointment notes b. If no - list the appointment type as a PHONE visit in appointment notes  3. Confirm consent - "In the setting of the current Covid19 crisis, you are scheduled for a (phone or video) visit with your provider on (June 17) at (11:00 am ).  Just as we do with many in-office visits, in order for you to participate in this visit, we must obtain consent.  If you'd like, I can send this to your mychart (if signed up) or email for you to review.  Otherwise, I can obtain your verbal consent now.  All virtual visits are billed to your insurance company just like a normal visit would be.  By agreeing to a virtual visit, we'd like you to understand that the technology does not allow for your provider to perform an examination, and thus may limit your provider's ability to fully assess your condition. If your provider identifies any concerns that need to be evaluated in person, we will make arrangements to do so.  Finally, though the technology is pretty good, we cannot assure that it will always work on either your or our end, and in the setting of a video visit, we may have to convert it to a phone-only visit.  In either situation, we cannot ensure that we have a secure connection.  Are you willing to proceed?" STAFF: Did the patient verbally acknowledge consent to telehealth visit?  Document YES/NO here: YES.    4. Advise patient to be prepared - "Two hours prior to your appointment, go ahead and check your blood pressure, pulse, oxygen saturation, and your weight (if you have the equipment to check those) and write them all down. When your visit starts, your provider will ask you for this information. If you have an Apple Watch or Kardia device, please plan to have heart rate information ready on the day of your appointment. Please have a pen and paper handy nearby the day of the visit as well."  5. Give patient instructions for MyChart download to smartphone OR Doximity/[email or web address].me as below if video visit (depending on what platform provider is using)  6. Inform patient they will receive a phone call 15 minutes prior to their appointment time (may be from unknown caller ID) so they should be prepared to answer    TELEPHONE CALL NOTE  BRENDT DIBLE has been deemed a candidate for a follow-up tele-health visit to limit community exposure during the Covid-19 pandemic. I spoke with the patient via phone to ensure availability of phone/video source, confirm preferred email & phone number, and discuss instructions and expectations.  I reminded [name] to be prepared with any vital sign and/or heart rhythm information that could potentially be obtained via home monitoring, at the time of his visit. I reminded [name] to expect  a phone call prior to his visit.   Avanell Shackleton 02/24/2019 12:17 PM   INSTRUCTIONS FOR DOWNLOADING THE MYCHART APP TO SMARTPHONE  - The patient must first make sure to have activated MyChart and know their login information - If Apple, go to [employer] and type in MyChart in the search bar and download the app. If Android, ask patient to go to [employer] and type in [place] in the search bar and download the app. The app is free but as with any other app downloads, their phone may require them to verify saved payment  information or Apple/Android password.  - The patient will need to then log into the app with their MyChart username and password, and select [facility] as their healthcare provider to link the account. When it is time for your visit, go to the MyChart app, find appointments, and click Begin Video Visit. Be sure to Select Allow for your device to access the Microphone and Camera for your visit. You will then be connected, and your provider will be with you shortly.  **If they have any issues connecting, or need assistance please contact MyChart service desk [phone])**  **If using a computer, in order to ensure the best quality for their visit they will need to use either of the following Internet Browsers: [employer], or Google Chrome**  IF USING DOXIMITY or DOXY.ME - The patient will receive a link just prior to their visit by text.     FULL LENGTH CONSENT FOR TELE-HEALTH VISIT   I hereby voluntarily request, consent and authorize [facility] and its employed or contracted physicians, physician assistants, nurse practitioners or other licensed health care professionals (the Practitioner), to provide me with telemedicine health care services (the "Services") as deemed necessary by the treating Practitioner. I acknowledge and consent to receive the Services by the Practitioner via telemedicine. I understand that the telemedicine visit will involve communicating with the Practitioner through live audiovisual communication technology and the disclosure of certain medical information by electronic transmission. I acknowledge that I have been given the opportunity to request an in-person assessment or other available alternative prior to the telemedicine visit and am voluntarily participating in the telemedicine visit.  I understand that I have the right to withhold or withdraw my consent to the use of telemedicine in the course of my care at any time, without affecting my right  to future care or treatment, and that the Practitioner or I may terminate the telemedicine visit at any time. I understand that I have the right to inspect all information obtained and/or recorded in the course of the telemedicine visit and may receive copies of available information for a reasonable fee.  I understand that some of the potential risks of receiving the Services via telemedicine include:  Marland Kitchen Delay or interruption in medical evaluation due to technological equipment failure or disruption; . Information transmitted may not be sufficient (e.g. poor resolution of images) to allow for appropriate medical decision making by the Practitioner; and/or  . In rare instances, security protocols could fail, causing a breach of personal health information.  Furthermore, I acknowledge that it is my responsibility to provide information about my medical history, conditions and care that is complete and accurate to the best of my ability. I acknowledge that Practitioner's advice, recommendations, and/or decision may be based on factors not within their control, such as incomplete or inaccurate data provided by me or distortions of diagnostic images or specimens that may  result from electronic transmissions. I understand that the practice of medicine is not an exact science and that Practitioner makes no warranties or guarantees regarding treatment outcomes. I acknowledge that I will receive a copy of this consent concurrently upon execution via email to the email address I last provided but may also request a printed copy by calling the office of [facility].    I understand that my insurance will be billed for this visit.   I have read or had this consent read to me. . I understand the contents of this consent, which adequately explains the benefits and risks of the Services being provided via telemedicine.  . I have been provided ample opportunity to ask questions regarding this consent and the Services  and have had my questions answered to my satisfaction. . I give my informed consent for the services to be provided through the use of telemedicine in my medical care  By participating in this telemedicine visit I agree to the above.

## 2019-02-24 NOTE — Patient Instructions (Addendum)
After Visit Summary:  We will be checking the following labs today - NONE   Medication Instructions:    Continue with your current medicines for now - we may have to start adding back blood pressure medicines in the future.    If you need a refill on your cardiac medications before your next appointment, please call your pharmacy.     Testing/Procedures To Be Arranged:  N/A  Follow-Up:   See me on June 29th in the office.     At [facility], you and your health needs are our priority.  As part of our continuing mission to provide you with exceptional heart care, we have created designated Provider Care Teams.  These Care Teams include your primary Cardiologist (physician) and Advanced Practice Providers (APPs -  Physician Assistants and Nurse Practitioners) who all work together to provide you with the care you need, when you need it.  Special Instructions:  . Stay safe, stay home, wash your hands for at least 20 seconds and wear a mask when out in public.  . It was good to talk with you today.  . Continue to monitor your BP. Bring your cuff to the next visit so we can check it for accuracy.    Call the [place] office at [phone] if you have any questions, problems or concerns.

## 2019-02-26 ENCOUNTER — Other Ambulatory Visit (HOSPITAL_COMMUNITY)
Admission: RE | Admit: 2019-02-26 | Discharge: 2019-02-26 | Disposition: A | Discharge status: Home / Self Care | Payer: Medicare Other | Source: Ambulatory Visit | Attending: Gastroenterology | Admitting: Gastroenterology

## 2019-02-26 DIAGNOSIS — Z1159 Encounter for screening for other viral diseases: Secondary | ICD-10-CM | POA: Diagnosis present

## 2019-02-27 LAB — SARS CORONAVIRUS 2 (TAT 6-24 HRS): SARS Coronavirus 2: NEGATIVE

## 2019-03-01 ENCOUNTER — Other Ambulatory Visit: Payer: Self-pay

## 2019-03-01 ENCOUNTER — Encounter (HOSPITAL_COMMUNITY): Payer: Self-pay

## 2019-03-01 NOTE — Progress Notes (Signed)
Call placed to patient prior to procedure with Dr. Watt Climes on 03/02/2019. He has quarantined at home since [place] test 02/26/2019.

## 2019-03-02 ENCOUNTER — Telehealth (HOSPITAL_COMMUNITY): Payer: Self-pay | Admitting: *Deleted

## 2019-03-02 ENCOUNTER — Encounter (HOSPITAL_COMMUNITY): Payer: Self-pay

## 2019-03-02 ENCOUNTER — Ambulatory Visit (HOSPITAL_COMMUNITY): Payer: Medicare Other | Admitting: Anesthesiology

## 2019-03-02 ENCOUNTER — Ambulatory Visit (HOSPITAL_COMMUNITY)
Admission: RE | Admit: 2019-03-02 | Discharge: 2019-03-02 | Disposition: A | Discharge status: Home / Self Care | Payer: Medicare Other | Attending: Gastroenterology | Admitting: Gastroenterology

## 2019-03-02 ENCOUNTER — Encounter (HOSPITAL_COMMUNITY)
Admission: RE | Disposition: A | Discharge status: Home / Self Care | Payer: Self-pay | Source: Home / Self Care | Attending: Gastroenterology

## 2019-03-02 DIAGNOSIS — D500 Iron deficiency anemia secondary to blood loss (chronic): Secondary | ICD-10-CM | POA: Diagnosis not present

## 2019-03-02 DIAGNOSIS — Z823 Family history of stroke: Secondary | ICD-10-CM | POA: Diagnosis not present

## 2019-03-02 DIAGNOSIS — I10 Essential (primary) hypertension: Secondary | ICD-10-CM | POA: Insufficient documentation

## 2019-03-02 DIAGNOSIS — Z79899 Other long term (current) drug therapy: Secondary | ICD-10-CM | POA: Insufficient documentation

## 2019-03-02 DIAGNOSIS — Z7951 Long term (current) use of inhaled steroids: Secondary | ICD-10-CM | POA: Insufficient documentation

## 2019-03-02 DIAGNOSIS — E785 Hyperlipidemia, unspecified: Secondary | ICD-10-CM | POA: Diagnosis not present

## 2019-03-02 DIAGNOSIS — D122 Benign neoplasm of ascending colon: Secondary | ICD-10-CM | POA: Insufficient documentation

## 2019-03-02 DIAGNOSIS — K5730 Diverticulosis of large intestine without perforation or abscess without bleeding: Secondary | ICD-10-CM | POA: Insufficient documentation

## 2019-03-02 DIAGNOSIS — G4730 Sleep apnea, unspecified: Secondary | ICD-10-CM | POA: Insufficient documentation

## 2019-03-02 DIAGNOSIS — D120 Benign neoplasm of cecum: Secondary | ICD-10-CM | POA: Diagnosis not present

## 2019-03-02 DIAGNOSIS — Z9049 Acquired absence of other specified parts of digestive tract: Secondary | ICD-10-CM | POA: Insufficient documentation

## 2019-03-02 DIAGNOSIS — J449 Chronic obstructive pulmonary disease, unspecified: Secondary | ICD-10-CM | POA: Diagnosis not present

## 2019-03-02 DIAGNOSIS — K219 Gastro-esophageal reflux disease without esophagitis: Secondary | ICD-10-CM | POA: Diagnosis not present

## 2019-03-02 DIAGNOSIS — K648 Other hemorrhoids: Secondary | ICD-10-CM | POA: Insufficient documentation

## 2019-03-02 DIAGNOSIS — K644 Residual hemorrhoidal skin tags: Secondary | ICD-10-CM | POA: Diagnosis not present

## 2019-03-02 DIAGNOSIS — D123 Benign neoplasm of transverse colon: Secondary | ICD-10-CM | POA: Diagnosis not present

## 2019-03-02 DIAGNOSIS — Z87891 Personal history of nicotine dependence: Secondary | ICD-10-CM | POA: Diagnosis not present

## 2019-03-02 DIAGNOSIS — M109 Gout, unspecified: Secondary | ICD-10-CM | POA: Diagnosis not present

## 2019-03-02 DIAGNOSIS — K921 Melena: Secondary | ICD-10-CM | POA: Diagnosis present

## 2019-03-02 HISTORY — PX: COLONOSCOPY: SHX5424

## 2019-03-02 HISTORY — PX: BIOPSY: SHX5522

## 2019-03-02 HISTORY — DX: Iron deficiency anemia, unspecified: D50.9

## 2019-03-02 SURGERY — COLONOSCOPY
Anesthesia: Monitor Anesthesia Care

## 2019-03-02 MED ORDER — LIDOCAINE 2% (20 MG/ML) 5 ML SYRINGE
INTRAMUSCULAR | Status: DC | PRN
  Administered 2019-03-02: 60 mg via INTRAVENOUS

## 2019-03-02 MED ORDER — SODIUM CHLORIDE 0.9 % IV SOLN: INTRAVENOUS | Status: DC

## 2019-03-02 MED ORDER — LACTATED RINGERS IV SOLN
INTRAVENOUS | Status: DC
  Administered 2019-03-02: 13:00:00 via INTRAVENOUS

## 2019-03-02 MED ORDER — PHENYLEPHRINE 40 MCG/ML (10ML) SYRINGE FOR IV PUSH (FOR BLOOD PRESSURE SUPPORT)
PREFILLED_SYRINGE | INTRAVENOUS | Status: DC | PRN
  Administered 2019-03-02: 80 ug via INTRAVENOUS

## 2019-03-02 MED ORDER — PROPOFOL 10 MG/ML IV BOLUS
INTRAVENOUS | Status: DC | PRN
  Administered 2019-03-02 (×3): 20 mg via INTRAVENOUS

## 2019-03-02 MED ORDER — PROPOFOL 500 MG/50ML IV EMUL
INTRAVENOUS | Status: DC | PRN
  Administered 2019-03-02: 75 ug/kg/min via INTRAVENOUS

## 2019-03-02 MED ORDER — PROPOFOL 10 MG/ML IV BOLUS
INTRAVENOUS | Status: AC
  Filled 2019-03-02: qty 40

## 2019-03-02 NOTE — Op Note (Signed)
[facility] Patient Name: [name] Procedure Date: 03/02/2019 MRN: [id number] Attending MD: Clarene Essex , MD Date of Birth: [birth year] CSN: [id number] Age: 85 Admit Type: Outpatient Procedure:                Colonoscopy Indications:              Heme positive stool, Iron deficiency anemia                            secondary to chronic blood loss Providers:                Clarene Essex, MD, Cleda Daub, RN, Cherylynn Ridges,                            Technician, Danley Danker, CRNA Referring MD:              Medicines:                Propofol total dose 220 mg IV, 60 mg lidocaine Complications:            No immediate complications. Estimated Blood Loss:     Estimated blood loss: none. Procedure:                Pre-Anesthesia Assessment:                           - Prior to the procedure, a History and Physical                            was performed, and patient medications and                            allergies were reviewed. The patient's tolerance of                            previous anesthesia was also reviewed. The risks                            and benefits of the procedure and the sedation                            options and risks were discussed with the patient.                            All questions were answered, and informed consent                            was obtained. Prior Anticoagulants: The patient has                            taken no previous anticoagulant or antiplatelet                            agents. ASA Grade Assessment: III - A patient with  severe systemic disease. After reviewing the risks                            and benefits, the patient was deemed in                            satisfactory condition to undergo the procedure.                           After obtaining informed consent, the colonoscope                            was passed under direct vision. Throughout the          procedure, the patient's blood pressure, pulse, and                            oxygen saturations were monitored continuously. The                            PCF-H190DL ([id number] Olympus pediatric colonscope                            was introduced through the anus and advanced to the                            the terminal ileum. The terminal ileum, ileocecal                            valve, appendiceal orifice, and rectum were                            photographed. The colonoscopy was performed without                            difficulty. The patient tolerated the procedure                            well. The quality of the bowel preparation was                            adequate. abdominal pressure was applied Scope In: 1:51:36 PM Scope Out: 2:19:25 PM Scope Withdrawal Time: 0 hours 22 minutes 24 seconds  Total Procedure Duration: 0 hours 27 minutes 49 seconds  Findings:      External and internal hemorrhoids were found during retroflexion, during       perianal exam and during digital exam. The hemorrhoids were medium-sized.      Scattered small and large-mouthed diverticula were found in the sigmoid       colon.      Three semi-sessile polyps were found in the transverse colon, ascending       colon and cecum. The polyps were small in size. These were biopsied with       a cold forceps for histology.      The terminal ileum appeared normal.      The exam  was otherwise without abnormality. Impression:               - External and internal hemorrhoids.                           - Diverticulosis in the sigmoid colon.                           - Three small polyps in the transverse colon, in                            the ascending colon and in the cecum. Biopsied.                           - The examined portion of the ileum was normal.                           - The examination was otherwise normal. Moderate Sedation:      Not Applicable - Patient had care per  Anesthesia. Recommendation:           - Patient has a contact number available for                            emergencies. The signs and symptoms of potential                            delayed complications were discussed with the                            patient. Return to normal activities tomorrow.                            Written discharge instructions were provided to the                            patient.                           - Soft diet today.                           - Continue present medications.                           - Await pathology results.                           - Repeat colonoscopy after studies are complete for                            surveillance based on pathology results.                           - Return to GI office PRN.                           -  Telephone GI clinic for pathology results in 1                            week.                           - Telephone GI clinic if symptomatic PRN. Procedure Code(s):        --- Professional ---                           [phone], Colonoscopy, flexible; with biopsy, single                            or multiple Diagnosis Code(s):        --- Professional ---                           K63.5, Polyp of colon                           R19.5, Other fecal abnormalities                           D50.0, Iron deficiency anemia secondary to blood                            loss (chronic)                           K57.30, Diverticulosis of large intestine without                            perforation or abscess without bleeding CPT copyright 2019 [employer]. All rights reserved. The codes documented in this report are preliminary and upon coder review may  be revised to meet current compliance requirements. Clarene Essex, MD 03/02/2019 2:25:37 PM This report has been signed electronically. Number of Addenda: 0

## 2019-03-02 NOTE — Transfer of Care (Signed)
Immediate Anesthesia Transfer of Care Note  [name]  Procedure(s) Performed: COLONOSCOPY (N/A ) BIOPSY  Patient Location: Endoscopy Unit  Anesthesia Type:MAC  Level of Consciousness: drowsy  Airway & Oxygen Therapy: Patient Spontanous Breathing and Patient connected to face mask oxygen  Post-op Assessment: Report given to RN and Post -op Vital signs reviewed and stable  Post vital signs: Reviewed and stable  Last Vitals:  Vitals Value Taken Time  BP    Temp    Pulse    Resp    SpO2      Last Pain:  Vitals:   03/02/19 1238  TempSrc: Other (Comment)  PainSc: 0-No pain         Complications: No apparent anesthesia complications

## 2019-03-02 NOTE — Progress Notes (Signed)
Pt arrived to Endo unit short of breath. Pt states he wears 2LNC HS. Connected pt to monitor with O2 sat 81% on room air. Improvement of O2 sat to 100% on 2L [place]. Called pt's wife Beverlee Nims and asked her to bring home O2 tank for transport home, if needed.

## 2019-03-02 NOTE — Progress Notes (Signed)
[name] 1:42 PM  Subjective: Patient saw just a little blood with his prep otherwise is doing fine but does not know his recent hemoglobin at his primary care office and has no new complaints  Objective: Vital signs stable afebrile no acute distress exam please see preassessment evaluation  Assessment: Guaiac positive anemia  Plan: Okay to proceed with colonoscopy with anesthesia assistance with further work-up and plans pending those findings  [facility]  office [phone] After 5PM or if no answer call [phone]

## 2019-03-02 NOTE — Telephone Encounter (Signed)
LM for patient re: Maintenance pulmonary rehab.  We continue to be closed and are unable to accommodate large exercise group at this time.  We encourage patient to exercise at home on his own. We will contact patient if we reopen maintenance.

## 2019-03-02 NOTE — Discharge Instructions (Signed)
Call if question or problem otherwise first meal soft solids and slowly advance diet as tolerated and expect to see a little bright red blood from his hemorrhoids and call for biopsy report in 1 week and probably follow-up in 1 month and take 1 iron pill a day and if doing okay tomorrow may resume baby aspirin every other day  YOU HAD AN ENDOSCOPIC PROCEDURE TODAY: Refer to the procedure report and other information in the discharge instructions given to you for any specific questions about what was found during the examination. If this information does not answer your questions, please call [facility] office at [phone] to clarify.   YOU SHOULD EXPECT: Some feelings of bloating in the abdomen. Passage of more gas than usual. Walking can help get rid of the air that was put into your GI tract during the procedure and reduce the bloating. If you had a lower endoscopy (such as a colonoscopy or flexible sigmoidoscopy) you may notice spotting of blood in your stool or on the toilet paper. Some abdominal soreness may be present for a day or two, also.  DIET: Your first meal following the procedure should be a light meal and then it is ok to progress to your normal diet. A half-sandwich or bowl of soup is an example of a good first meal. Heavy or fried foods are harder to digest and may make you feel nauseous or bloated. Drink plenty of fluids but you should avoid alcoholic beverages for 24 hours. If you had a esophageal dilation, please see attached instructions for diet.   ACTIVITY: Your care partner should take you home directly after the procedure. You should plan to take it easy, moving slowly for the rest of the day. You can resume normal activity the day after the procedure however YOU SHOULD NOT DRIVE, use power tools, machinery or perform tasks that involve climbing or major physical exertion for 24 hours (because of the sedation medicines used during the test).   SYMPTOMS TO REPORT IMMEDIATELY: A  gastroenterologist can be reached at any hour. Please call [phone]  for any of the following symptoms:   Following lower endoscopy (colonoscopy, flexible sigmoidoscopy) Excessive amounts of blood in the stool  Significant tenderness, worsening of abdominal pains  Swelling of the abdomen that is new, acute  Fever of 100 or higher   FOLLOW UP:  If any biopsies were taken you will be contacted by phone or by letter within the next 1-3 weeks. Call [phone]  if you have not heard about the biopsies in 3 weeks.  Please also call with any specific questions about appointments or follow up tests.

## 2019-03-02 NOTE — Anesthesia Preprocedure Evaluation (Addendum)
Anesthesia Evaluation  Patient identified by MRN, date of birth, ID band Patient awake    Reviewed: Allergy & Precautions, NPO status , Patient's Chart, lab work & pertinent test results  Airway Mallampati: II  TM Distance: >3 FB Neck ROM: Full    Dental no notable dental hx.    Pulmonary sleep apnea , COPD, former smoker,   Patient needs to be sitting upright in order to not feel SOB. Uses oxygen at night Pulmonary exam normal breath sounds clear to auscultation       Cardiovascular hypertension, Pt. on medications and Pt. on home beta blockers Normal cardiovascular exam Rhythm:Regular Rate:Normal     Neuro/Psych negative neurological ROS  negative psych ROS   GI/Hepatic Neg liver ROS, GERD  ,  Endo/Other  negative endocrine ROS  Renal/GU negative Renal ROS  negative genitourinary   Musculoskeletal negative musculoskeletal ROS (+)   Abdominal   Peds negative pediatric ROS (+)  Hematology negative hematology ROS (+)   Anesthesia Other Findings   Reproductive/Obstetrics negative OB ROS                            Anesthesia Physical Anesthesia Plan  ASA: III  Anesthesia Plan: MAC   Post-op Pain Management:    Induction: Intravenous  PONV Risk Score and Plan: 0  Airway Management Planned: Simple Face Mask  Additional Equipment:   Intra-op Plan:   Post-operative Plan:   Informed Consent: I have reviewed the patients History and Physical, chart, labs and discussed the procedure including the risks, benefits and alternatives for the proposed anesthesia with the patient or authorized representative who has indicated his/her understanding and acceptance.     Dental advisory given  Plan Discussed with: CRNA and Surgeon  Anesthesia Plan Comments:         Anesthesia Quick Evaluation

## 2019-03-03 NOTE — Anesthesia Postprocedure Evaluation (Signed)
Anesthesia Post Note  [name]  Procedure(s) Performed: COLONOSCOPY (N/A ) BIOPSY     Patient location during evaluation: PACU Anesthesia Type: MAC Level of consciousness: awake and alert Pain management: pain level controlled Vital Signs Assessment: post-procedure vital signs reviewed and stable Respiratory status: spontaneous breathing, nonlabored ventilation, respiratory function stable and patient connected to nasal cannula oxygen Cardiovascular status: stable and blood pressure returned to baseline Postop Assessment: no apparent nausea or vomiting Anesthetic complications: no    Last Vitals:  Vitals:   03/02/19 1430 03/02/19 1440  BP: (!) 152/76 (!) 172/81  Pulse: 74 75  Resp: (!) 33 20  Temp: 36.6 C   SpO2: 100% 100%    Last Pain:  Vitals:   03/02/19 1440  TempSrc:   PainSc: 0-No pain                 Adolph Clutter S

## 2019-03-04 ENCOUNTER — Encounter (HOSPITAL_COMMUNITY): Payer: Self-pay | Admitting: Gastroenterology

## 2019-03-04 ENCOUNTER — Telehealth: Payer: Self-pay

## 2019-03-04 NOTE — Telephone Encounter (Signed)
    COVID-19 Pre-Screening Questions:  . In the past 7 to 10 days have you had a cough,  shortness of breath, headache, congestion, fever (100 or greater) body aches, chills, sore throat, or sudden loss of taste or sense of smell? . Have you been around anyone with known Covid 19. . Have you been around anyone who is awaiting Covid 19 test results in the past 7 to 10 days? . Have you been around anyone who has been exposed to Covid 19, or has mentioned symptoms of Covid 19 within the past 7 to 10 days?  If you have any concerns/questions about symptoms patients report during screening (either on the phone or at threshold). Contact the provider seeing the patient or DOD for further guidance.  If neither are available contact a member of the leadership team.      Patient answered no to the above questions.

## 2019-03-05 NOTE — Progress Notes (Signed)
CARDIOLOGY OFFICE NOTE  Date:  03/08/2019    [name] Date of Birth: 1934/03/06 Medical Record #[id number]  PCP:  [name], MD  Cardiologist:  [name]    Chief Complaint  Patient presents with  . Atrial Fibrillation    Follow up visit. Seen for Dr. [name]    History of Present Illness: [name] is a 85 y.o. male who presents today for a follow up visit. Seen for Dr. [name]. He has seen Dr. [name] in the remote past.Primarily follows with me.  He has a h/o PVC ablation 05/29/10, PAF, obesity, sedentary lifestyle, HLD, gout, OSA treated with CPAP, &dysfunctional diaphragm with chronic dyspneapreviouslyfollowed by Dr. [name]- now with Dr. [name]. He has mild CAD per cath back in August of 2011. Labs are checked by primary care.   Seen back in February of 2016 by Dr. [name] - this was for evaluation of dyspnea. Dr. [name] wanted cardiac status reevaluated to assess for any cardiac issues that may be contributing to dyspnea. Was referred for Myoview and echocardiogram. These studies were satisfactory. Encouraged to cut back on his alcohol intake as well.   I have followed him since - he has been holding his own - dyspnea is chronic.He has gotten back with pulmonary - now seeing Dr. [name].Balance issues and has had some falls.Has ended up having documented AF - was placed on anticoagulation - initially with Eliquis - but then transitioned to Coumadin due to costand then back to Eliquis due to labile INRs. Has also needed some regulation of his BP medicines. When seen in the office in March, he was doing pretty well.   Then had several phone calls prior to last telehealth visit in April - BP low - had to cut back lots of his medicines. Was having tingling in his back and shoulders. At the virtual visit, his wife and daughter were present - they had lots of concerns (on top of his chronic depression) - getting weak, did not look good - dizzy -  I ended up sending him to the office for labs - marked anemia noted - he was admitted and transfused and referred to GI. Eliquis was stopped.   I saw him for a repeat virtual visit back in earlier this month. He was doing ok. Getting ready to have a colonoscopy with GI. Remained off Eliquis. Had postponed some type of eye surgery he was considering. Still with limited activity. Some fluctuation in his BP.   The patient does not have symptoms concerning for COVID-19 infection (fever, chills, cough, or new shortness of breath).   Comes in today. Here alone. He is short of breath - sats down to 80% after walking in - recovers with deep breathing. He has been using his oxygen more. Seeing pulmonary tomorrow. He has not been going to pulmonary rehab due to [place]. His balance is an issue. He is not dizzy. He is quite unsteady. He says he feels like he is "still anemic". He is not dizzy at all. No palpitations. He notes that with his colon prep he was passing blood and passed one clot. Does not sound like real etiology noted - chronic gastritis on EGD noted - had 3 polyps removed on colonoscopy - says biopsy was negative. Has follow up with GI in about 3 weeks.  Remains off Eliquis.    Past Medical History:  Diagnosis Date  . Arthritis   . BPH (benign prostatic hyperplasia)   .  COPD (chronic obstructive pulmonary disease) ([place])   . Coronary artery ectasia    Mild CAD with normal systolic function per cath in August of 2011  . Depression   . Diaphragmatic paralysis    felt to be partially responsible for dyspnea  . Dyspnea    due to paralyzed diaphragm and pulmonary issues  . Dysrhythmia   . GERD (gastroesophageal reflux disease)   . Gout   . HTN (hypertension)   . Hypercholesteremia   . Iron deficiency anemia   . Microhematuria   . Obesity   . OSA (obstructive sleep apnea)    CPAP  . Paroxysmal atrial fibrillation (HCC)    occured in the setting of acute E Coli sepsis and ileius 7/12  .  PVC (premature ventricular contraction)    s/p PVC ablation 05/29/2010    Past Surgical History:  Procedure Laterality Date  . APPENDECTOMY  1978  . BIOPSY  02/09/2019   Procedure: BIOPSY;  Surgeon: Clarene Essex, MD;  Location: [facility];  Service: Endoscopy;;  . BIOPSY  03/02/2019   Procedure: BIOPSY;  Surgeon: Clarene Essex, MD;  Location: [facility];  Service: Endoscopy;;  . CARDIAC CATHETERIZATION  04-30-2010   Left main coronary artery is normal.   . CARDIOVASCULAR STRESS TEST  03-19-2010   0%  . CHOLECYSTECTOMY  1990  . COLONOSCOPY N/A 03/02/2019   Procedure: COLONOSCOPY;  Surgeon: Clarene Essex, MD;  Location: [facility];  Service: Endoscopy;  Laterality: N/A;  . COLONOSCOPY W/ POLYPECTOMY    . ESOPHAGOGASTRODUODENOSCOPY (EGD) WITH PROPOFOL N/A 02/09/2019   Procedure: ESOPHAGOGASTRODUODENOSCOPY (EGD) WITH PROPOFOL;  Surgeon: Clarene Essex, MD;  Location: [facility];  Service: Endoscopy;  Laterality: N/A;  . EYE SURGERY Right    catarct  . poly removed from nose as a child    . PVC ablation  05/29/2010  . US ECHOCARDIOGRAPHY  03-09-2010   EF 55-60%     Medications: Current Meds  Medication Sig  . allopurinol (ZYLOPRIM) 300 MG tablet Take 300 mg by mouth daily.    Marland Kitchen buPROPion (WELLBUTRIN SR) 100 MG 12 hr tablet Take 100 mg by mouth 2 (two) times daily.  Marland Kitchen docusate sodium (COLACE) 100 MG capsule Take 100 mg by mouth daily as needed for constipation.  Marland Kitchen doxazosin (CARDURA) 4 MG tablet Take 2 mg by mouth daily.  . ferrous sulfate 325 (65 FE) MG tablet Take 325 mg by mouth daily with breakfast.  . ipratropium (ATROVENT) 0.03 % nasal spray Place 1 spray into both nostrils 2 (two) times daily as needed for congestion.  . lovastatin (MEVACOR) 40 MG tablet Take 40 mg by mouth at bedtime.    . metoprolol tartrate (LOPRESSOR) 25 MG tablet Take 1 tablet (25 mg total) by mouth 2 (two) times daily for 30 days.  . OXYGEN Inhale 2 L into the lungs daily as needed (SOB).   . pantoprazole  (PROTONIX) 40 MG tablet Take 40 mg by mouth daily.  . sertraline (ZOLOFT) 100 MG tablet Take 100 mg by mouth daily.  . traZODone (DESYREL) 100 MG tablet Take 100 mg by mouth at bedtime.    . vitamin B-12 (CYANOCOBALAMIN) 500 MCG tablet Take 500 mcg by mouth daily.     Allergies: No Known Allergies  Social History: The patient  reports that he quit smoking about 34 years ago. His smoking use included cigarettes. He has a 60.00 pack-year smoking history. He has never used smokeless tobacco. He reports current alcohol use of about 7.0 standard drinks  of alcohol per week. He reports that he does not use drugs.   Family History: The patient's family history includes Aneurysm (age of onset: 29) in his father; Breast cancer in his daughter and mother.   Review of Systems: Please see the history of present illness.   All other systems are reviewed and negative.   Physical Exam: VS:  BP (!) 126/58 (BP Location: Left Arm, Patient Position: Sitting, Cuff Size: Normal) Comment: office/his cuff 127/67  Pulse 63   Ht 5\' 10"  (1.778 m)   Wt 198 lb 12.8 oz (90.2 kg)   SpO2 (!) 79% Comment: walking in/94 after deep breaths  BMI 28.52 kg/m  .  BMI Body mass index is 28.52 kg/m.  Wt Readings from Last 3 Encounters:  03/08/19 198 lb 12.8 oz (90.2 kg)  03/01/19 200 lb (90.7 kg)  02/24/19 199 lb (90.3 kg)    General: Pleasant. Elderly. Alert and in no acute distress. He is using a cane. He has a mild tremor of his hands noted. Affect flat - this is chronic.    HEENT: Normal.  Neck: Supple, no JVD, carotid bruits, or masses noted.  Cardiac: Regular rate and rhythm. No murmurs, rubs, or gallops. No edema.  Respiratory:  Lungs are clear to auscultation bilaterally with normal work of breathing.  GI: Soft and nontender.  MS: No deformity or atrophy. Gait unsteady.  Skin: Warm and dry. Color is normal.  Neuro:  Strength and sensation are intact and no gross focal deficits noted.  Psych: Alert,  appropriate and with normal affect.   LABORATORY DATA:  EKG:  EKG is not ordered today.  Lab Results  Component Value Date   WBC 6.0 01/07/2019   HGB 7.5 (L) 01/07/2019   HCT 24.6 (L) 01/07/2019   PLT 230 01/07/2019   GLUCOSE 97 01/07/2019   ALT 16 01/05/2019   AST 21 01/05/2019   NA 138 01/07/2019   K 4.3 01/07/2019   CL 109 01/07/2019   CREATININE 1.50 (H) 01/07/2019   BUN 29 (H) 01/07/2019   CO2 22 01/07/2019   TSH 2.640 01/05/2019   INR 1.3 (H) 01/05/2019       BNP (last 3 results) No results for input(s): BNP in the last 8760 hours.  ProBNP (last 3 results) No results for input(s): PROBNP in the last 8760 hours.   Other Studies Reviewed Today:  Holter 02/2018 Narrative & Impression    Sinus rhythm with first degree AV block, Right bundle branch block Average heart rate is 62 bpm Occasional premature ventricular contractions Frequent premature atrial contractions with nonsustained atrial tachycardia Nonsustained atrial fibrillation also observed Nocturnal bradycardia with mobitz I second degree AV block is noted transiently No sustained arrhythmias No prolonged pauses      Myoview Impression from March 2016 Exercise Capacity: Lexiscan with no exercise. BP Response: Normal blood pressure response. Clinical Symptoms: Nausea. ECG Impression: Atrial fibrillation, no changes from baseline.  Comparison with Prior Nuclear Study: No images to compare  Overall Impression: Low risk stress nuclear study a medium-sized basal inferior and basal to mid inferolateral perfusion defect that is actually worse at rest than with stress. No ischemia. Given normal wall motion, this may represent diaphragmatic attenuation. .  LV Ejection Fraction: 63%. LV Wall Motion: NL LV Function; NL Wall Motion   Loralie Champagne 11/07/2014   Echo Study Conclusions from February 2016  - Left ventricle: The cavity size was normal. Wall thickness was increased in a  pattern of mild LVH.  Systolic function was normal. The estimated ejection fraction was in the range of 60% to 65%. Wall motion was normal; there were no regional wall motion abnormalities. - Aortic valve: There was trivial regurgitation. - Left atrium: The atrium was mildly dilated. - Right atrium: The atrium was mildly dilated.    ASSESSMENT & PLAN:    1. Recent GI bleed - off Eliquis - s/p EGD and colonoscopy. Remains off Eliquis.   2. Pre op clearance for eye surgery - this has been postponed. Not discussed today.   3. HTN - has had multiple medicines stopped previously due to GI bleed/anemia - BP is still ok - I do not feel like we need to add anything back at this time - will follow.    4. PAF - noted onpriorHolter - his CHADSVASC is at least 56 (age, HTN, CAD)-he was initially placed on Eliquis -switched toCoumadin due to costand then back to Eliquis due to marked lability and then stopped due to marked anemia/bleeding. Remains off at this time. I do not feel it is safe to resume at this time - we will recheck his labs. He has follow up with Dr. Watt Climes soon - will see what his recommendations are. I would want to see his blood count normalize.   5. Chronic dyspnea - He hadcardiac studiesback [id number] whichwere stable.Still can't return to pulmonary rehab due to [place] -  This really helped him from the aspect of his breathing and depression.   6. PVCs - past ablation- remains on low dose Toprol.Not endorsed. Rhythm is regular today.   7. Obesity -weight is stable.   8. Depression - long standing issue  9. HLD -on statin therapy.  10. CAD -no active chest pain. Medical management.   11. Prior chronic anticoagulation -remains off at this time. Will need to follow - at this time I am not inclined to resume - he understands his risk of stroke is higher at this time. Needs repeat lab - needs to have a normal blood count. Will see what GI says.     12. COVID-19 Education: The signs and symptoms of COVID-19 were discussed with the patient and how to seek care for testing (follow up with PCP or arrange E-visit).  The importance of social distancing, staying at home, hand hygiene and wearing a mask when out in public were discussed today.  Current medicines are reviewed with the patient today.  The patient does not have concerns regarding medicines other than what has been noted above.  The following changes have been made:  See above.  Labs/ tests ordered today include:    Orders Placed This Encounter  Procedures  . Basic metabolic panel  . CBC     Disposition:   FU with me in about 1 month.    Patient is agreeable to this plan and will call if any problems develop in the interim.   SignedTruitt Merle, NP  03/08/2019 2:27 PM  [facility] [address] [place], [place]  [address] Phone: [phone] Fax: [phone]

## 2019-03-08 ENCOUNTER — Other Ambulatory Visit: Payer: Self-pay

## 2019-03-08 ENCOUNTER — Encounter (INDEPENDENT_AMBULATORY_CARE_PROVIDER_SITE_OTHER): Payer: Self-pay

## 2019-03-08 ENCOUNTER — Ambulatory Visit (INDEPENDENT_AMBULATORY_CARE_PROVIDER_SITE_OTHER): Payer: Medicare Other | Admitting: Nurse Practitioner

## 2019-03-08 ENCOUNTER — Encounter: Payer: Self-pay | Admitting: Nurse Practitioner

## 2019-03-08 VITALS — BP 126/58 | HR 63 | Ht 70.0 in | Wt 198.8 lb

## 2019-03-08 DIAGNOSIS — K922 Gastrointestinal hemorrhage, unspecified: Secondary | ICD-10-CM | POA: Diagnosis not present

## 2019-03-08 DIAGNOSIS — Z7189 Other specified counseling: Secondary | ICD-10-CM | POA: Diagnosis not present

## 2019-03-08 DIAGNOSIS — I480 Paroxysmal atrial fibrillation: Secondary | ICD-10-CM | POA: Diagnosis not present

## 2019-03-08 DIAGNOSIS — I259 Chronic ischemic heart disease, unspecified: Secondary | ICD-10-CM

## 2019-03-08 DIAGNOSIS — R0602 Shortness of breath: Secondary | ICD-10-CM

## 2019-03-08 NOTE — Telephone Encounter (Signed)
Called [employer] and they confirmed the patient was only receiving supplies from them and the last order of supplies was May 2020. They do not have anything listed regarding his CPAP machine.  I called [facility] [place] office) [phone] and confirmed they provided the new CPAP machine 11/25/18 and they changed the pressure settings to auto 10-20 in May 2020 as requested by our office. But they are showing no compliance with the new CPAP machine that was issued.  I called the patient and confirmed that the old CPAP machine he has been using does not have an SD memory card.  Routed to [name], NP. Patient scheduled to see her tomorrow for 4 week follow up.

## 2019-03-08 NOTE — Patient Instructions (Addendum)
After Visit Summary:  We will be checking the following labs today - BMET & CBC   Medication Instructions:    Continue with your current medicines.    If you need a refill on your cardiac medications before your next appointment, please call your pharmacy.     Testing/Procedures To Be Arranged:  N/A  Follow-Up:   See me in about a month    At [facility], you and your health needs are our priority.  As part of our continuing mission to provide you with exceptional heart care, we have created designated Provider Care Teams.  These Care Teams include your primary Cardiologist (physician) and Advanced Practice Providers (APPs -  Physician Assistants and Nurse Practitioners) who all work together to provide you with the care you need, when you need it.  Special Instructions:  . Stay safe, stay home, wash your hands for at least 20 seconds and wear a mask when out in public.  . It was good to talk with you today.  . Ask Dr. Watt Climes his thoughts about the Elquis - we will STAY OFF for now   Call the [place] office at [phone] if you have any questions, problems or concerns.

## 2019-03-09 ENCOUNTER — Encounter: Payer: Self-pay | Admitting: Nurse Practitioner

## 2019-03-09 ENCOUNTER — Ambulatory Visit: Payer: Medicare Other | Admitting: Nurse Practitioner

## 2019-03-09 DIAGNOSIS — J432 Centrilobular emphysema: Secondary | ICD-10-CM | POA: Diagnosis not present

## 2019-03-09 DIAGNOSIS — J9611 Chronic respiratory failure with hypoxia: Secondary | ICD-10-CM | POA: Diagnosis not present

## 2019-03-09 DIAGNOSIS — G4733 Obstructive sleep apnea (adult) (pediatric): Secondary | ICD-10-CM

## 2019-03-09 LAB — CBC
Hematocrit: 36.2 % — ABNORMAL LOW (ref 37.5–51.0)
Hemoglobin: 11.5 g/dL — ABNORMAL LOW (ref 13.0–17.7)
MCH: 29 pg (ref 26.6–33.0)
MCHC: 31.8 g/dL (ref 31.5–35.7)
MCV: 91 fL (ref 79–97)
Platelets: 189 10*3/uL (ref 150–450)
RBC: 3.97 x10E6/uL — ABNORMAL LOW (ref 4.14–5.80)
RDW: 17.9 % — ABNORMAL HIGH (ref 11.6–15.4)
WBC: 5.5 10*3/uL (ref 3.4–10.8)

## 2019-03-09 LAB — BASIC METABOLIC PANEL
BUN/Creatinine Ratio: 25 — ABNORMAL HIGH (ref 10–24)
BUN: 31 mg/dL — ABNORMAL HIGH (ref 8–27)
CO2: 25 mmol/L (ref 20–29)
Calcium: 9.1 mg/dL (ref 8.6–10.2)
Chloride: 104 mmol/L (ref 96–106)
Creatinine, Ser: 1.23 mg/dL (ref 0.76–1.27)
GFR calc Af Amer: 62 mL/min/{1.73_m2} (ref >59)
GFR calc non Af Amer: 54 mL/min/{1.73_m2} — ABNORMAL LOW (ref >59)
Glucose: 100 mg/dL — ABNORMAL HIGH (ref 65–99)
Potassium: 4.1 mmol/L (ref 3.5–5.2)
Sodium: 140 mmol/L (ref 134–144)

## 2019-03-09 NOTE — Assessment & Plan Note (Signed)
Patient Instructions  Patient continues to benefit from CPAP with good compliance and control documented Will try to get new SD for patient's old machine Patient would like to continue old CPAP machine for now Continue CPAP at current settings Continue current medications Goal of 4 hours or more usage per night Maintain healthy weight Do not drive if drowsy  Continue 2L O2 at night Use 2-3L O2 during the day with exertion also to keep O2 sats at 88% or higher  Continue current medications   Follow up with [facility] in 3 months or sooner if needed

## 2019-03-09 NOTE — Assessment & Plan Note (Signed)
Patient was intolerant of new CPAP machine.  Continue using his old machine.  Patient Instructions  Patient continues to benefit from CPAP with good compliance and control documented Will try to get new SD for patient's old machine Patient would like to continue old CPAP machine for now Continue CPAP at current settings Continue current medications Goal of 4 hours or more usage per night Maintain healthy weight Do not drive if drowsy  Continue 2L O2 at night Use 2-3L O2 during the day with exertion also to keep O2 sats at 88% or higher  Continue current medications   Follow up with [facility] in 3 months or sooner if needed

## 2019-03-09 NOTE — Patient Instructions (Addendum)
Patient continues to benefit from CPAP with good compliance and control documented Will try to get new SD for patient's old machine Patient would like to continue old CPAP machine for now Continue CPAP at current settings Continue current medications Goal of 4 hours or more usage per night Maintain healthy weight Do not drive if drowsy  Continue 2L O2 at night Use 2-3L O2 during the day with exertion also to keep O2 sats at 88% or higher  Continue current medications   Follow up with [facility] in 3 months or sooner if needed

## 2019-03-09 NOTE — Progress Notes (Signed)
@Patient  ID: [name], male    DOB: 27-Jun-1934, 85 y.o.   MRN: [id number]  Chief Complaint  Patient presents with  . Sleep Apnea    1 Month Follow Up    Referring provider: [name], MD  HPI  85 year old male former smoker with from weakness as well COPD and OSA who is followed by Dr. Ander Slade  Tests:  HST 10/27/18 - AHI 5.3 SpO2 low at 77%  Spirometry 07/23/2017>>Very severe restriction.  PFT Results Latest Ref Rng & Units 08/25/2014  FVC-Pre L 1.68  FVC-Predicted Pre % 41  FVC-Post L 1.72  FVC-Predicted Post % 42  Pre FEV1/FVC % % 55  Post FEV1/FCV % % 61  FEV1-Pre L 0.92  FEV1-Predicted Pre % 31  FEV1-Post L 1.04  DLCO UNC% % 16  DLCO COR %Predicted % 56  TLC L 4.51  TLC % Predicted % 63  RV % Predicted % 105     OV 03/09/19 - follow up Patient presents today to follow-up on COPD and OSA.  Patient has not been able to tolerate his new CPAP machine.  He does continue to use his old CPAP machine but unfortunately we do not have a SD card for download.  He states that he uses his CPAP nightly and does still benefit from use of CPAP machine.  Patient continues to use 2 L of oxygen with exertion and at night.  Patient is not currently on any inhalers and states that he does not want to use an inhaler.  He states that he not been as active over the past few months has noticed slightly worsening shortness of breath with exertion.  Patient was recently admitted to the hospital for anemia and GI bleed and reports that he did follow-up with GI yesterday and hemoglobin has improved from 7.5-11.5.  Denies f/c/s, n/v/d, hemoptysis, PND, leg swelling.    No Known Allergies  Immunization History  Administered Date(s) Administered  . Influenza Whole 05/10/2010  . Influenza, High Dose Seasonal PF 07/04/2016, 06/29/2018  . Influenza,inj,Quad PF,6+ Mos 07/06/2015, 05/05/2017  . Influenza-Unspecified 07/17/2014, 06/06/2015  . Pneumococcal Polysaccharide-23 05/10/2009     Past Medical History:  Diagnosis Date  . Arthritis   . BPH (benign prostatic hyperplasia)   . COPD (chronic obstructive pulmonary disease) ([place])   . Coronary artery ectasia    Mild CAD with normal systolic function per cath in August of 2011  . Depression   . Diaphragmatic paralysis    felt to be partially responsible for dyspnea  . Dyspnea    due to paralyzed diaphragm and pulmonary issues  . Dysrhythmia   . GERD (gastroesophageal reflux disease)   . Gout   . HTN (hypertension)   . Hypercholesteremia   . Iron deficiency anemia   . Microhematuria   . Obesity   . OSA (obstructive sleep apnea)    CPAP  . Paroxysmal atrial fibrillation (HCC)    occured in the setting of acute E Coli sepsis and ileius 7/12  . PVC (premature ventricular contraction)    s/p PVC ablation 05/29/2010    Tobacco History: Social History   Tobacco Use  Smoking Status Former Smoker  . Packs/day: 3.00  . Years: 20.00  . Pack years: 60.00  . Types: Cigarettes  . Quit date: 09/09/1984  . Years since quitting: 34.5  Smokeless Tobacco Never Used   Counseling given: Yes   Outpatient Encounter Medications as of 03/09/2019  Medication Sig  . allopurinol (ZYLOPRIM) 300 MG  tablet Take 300 mg by mouth daily.    Marland Kitchen buPROPion (WELLBUTRIN SR) 100 MG 12 hr tablet Take 100 mg by mouth 2 (two) times daily.  Marland Kitchen docusate sodium (COLACE) 100 MG capsule Take 100 mg by mouth daily as needed for constipation.  Marland Kitchen doxazosin (CARDURA) 4 MG tablet Take 2 mg by mouth daily.  . ferrous sulfate 325 (65 FE) MG tablet Take 325 mg by mouth daily with breakfast.  . ipratropium (ATROVENT) 0.03 % nasal spray Place 1 spray into both nostrils 2 (two) times daily as needed for congestion.  . lovastatin (MEVACOR) 40 MG tablet Take 40 mg by mouth at bedtime.    . metoprolol tartrate (LOPRESSOR) 25 MG tablet Take 1 tablet (25 mg total) by mouth 2 (two) times daily for 30 days.  . OXYGEN Inhale 2 L into the lungs daily as needed (SOB).    . pantoprazole (PROTONIX) 40 MG tablet Take 40 mg by mouth daily.  . sertraline (ZOLOFT) 100 MG tablet Take 100 mg by mouth daily.  . traZODone (DESYREL) 100 MG tablet Take 100 mg by mouth at bedtime.    . vitamin B-12 (CYANOCOBALAMIN) 500 MCG tablet Take 500 mcg by mouth daily.   No facility-administered encounter medications on file as of 03/09/2019.      Review of Systems  Review of Systems  Constitutional: Negative.  Negative for chills and fever.  HENT: Negative.   Respiratory: Positive for shortness of breath. Negative for cough and wheezing.   Cardiovascular: Negative.  Negative for chest pain, palpitations and leg swelling.  Gastrointestinal: Negative.   Allergic/Immunologic: Negative.   Neurological: Negative.   Psychiatric/Behavioral: Negative.        Physical Exam  BP 136/80 (BP Location: Left Arm, Patient Position: Sitting, Cuff Size: Normal)   Pulse 60   Temp 98 F (36.7 C)   Ht 5\' 10"  (1.778 m)   Wt 198 lb 3.2 oz (89.9 kg)   SpO2 97% Comment: on 2L of O2  BMI 28.44 kg/m   Wt Readings from Last 5 Encounters:  03/09/19 198 lb 3.2 oz (89.9 kg)  03/08/19 198 lb 12.8 oz (90.2 kg)  03/01/19 200 lb (90.7 kg)  02/24/19 199 lb (90.3 kg)  02/09/19 200 lb (90.7 kg)     Physical Exam Vitals signs and nursing note reviewed.  Constitutional:      General: He is not in acute distress.    Appearance: He is well-developed.  Cardiovascular:     Rate and Rhythm: Normal rate and regular rhythm.  Pulmonary:     Effort: Pulmonary effort is normal. No respiratory distress.     Breath sounds: Normal breath sounds. No wheezing or rhonchi.  Musculoskeletal:        General: No swelling.  Skin:    General: Skin is warm and dry.  Neurological:     Mental Status: He is alert and oriented to person, place, and time.       Assessment & Plan:   COPD (chronic obstructive pulmonary disease) with emphysema ([place]) Patient presents today to follow-up on COPD and OSA.   Patient has not been able to tolerate his new CPAP machine.  He does continue to use his old CPAP machine but unfortunately we do not have a SD card for download.  He states that he uses his CPAP nightly and does still benefit from use of CPAP machine.  Patient continues to use 2 L of oxygen with exertion and at night.  Patient is  not currently on any inhalers and states that he does not want to use an inhaler.  He states that he not been as active over the past few months has noticed slightly worsening shortness of breath with exertion.  Patient was recently admitted to the hospital for anemia and GI bleed and reports that he did follow-up with GI yesterday and hemoglobin has improved from 7.5-11.5.  Patient is not interested in any inhalers at this time Patient refuses pulmonary rehab at this time  Chronic respiratory failure with hypoxia [facility]) Patient Instructions  Patient continues to benefit from CPAP with good compliance and control documented Will try to get new SD for patient's old machine Patient would like to continue old CPAP machine for now Continue CPAP at current settings Continue current medications Goal of 4 hours or more usage per night Maintain healthy weight Do not drive if drowsy  Continue 2L O2 at night Use 2-3L O2 during the day with exertion also to keep O2 sats at 88% or higher  Continue current medications   Follow up with [facility] in 3 months or sooner if needed     OSA (obstructive sleep apnea) Patient was intolerant of new CPAP machine.  Continue using his old machine.  Patient Instructions  Patient continues to benefit from CPAP with good compliance and control documented Will try to get new SD for patient's old machine Patient would like to continue old CPAP machine for now Continue CPAP at current settings Continue current medications Goal of 4 hours or more usage per night Maintain healthy weight Do not drive if drowsy  Continue 2L O2 at night  Use 2-3L O2 during the day with exertion also to keep O2 sats at 88% or higher  Continue current medications   Follow up with [facility] in 3 months or sooner if needed        Fenton Foy, NP 03/09/2019

## 2019-03-09 NOTE — Assessment & Plan Note (Signed)
Patient presents today to follow-up on COPD and OSA.  Patient has not been able to tolerate his new CPAP machine.  He does continue to use his old CPAP machine but unfortunately we do not have a SD card for download.  He states that he uses his CPAP nightly and does still benefit from use of CPAP machine.  Patient continues to use 2 L of oxygen with exertion and at night.  Patient is not currently on any inhalers and states that he does not want to use an inhaler.  He states that he not been as active over the past few months has noticed slightly worsening shortness of breath with exertion.  Patient was recently admitted to the hospital for anemia and GI bleed and reports that he did follow-up with GI yesterday and hemoglobin has improved from 7.5-11.5.  Patient is not interested in any inhalers at this time Patient refuses pulmonary rehab at this time

## 2019-03-09 NOTE — Addendum Note (Signed)
Addended by: Nena Polio on: 03/09/2019 12:22 PM   Modules accepted: Orders

## 2019-04-06 ENCOUNTER — Telehealth: Payer: Self-pay | Admitting: Nurse Practitioner

## 2019-04-06 NOTE — Telephone Encounter (Signed)

## 2019-04-06 NOTE — Progress Notes (Signed)
CARDIOLOGY OFFICE NOTE  Date:  04/07/2019    [name] Date of Birth: Nov 27, 1933 Medical Record #[id number]  PCP:  [name], MD  Cardiologist:  [name]   Chief Complaint  Patient presents with  . Follow-up    Seen for Dr. [name]    History of Present Illness: [name] is a 85 y.o. male who presents today for a follow up visit. Seen for Dr. [name]. He has seen Dr. [name] in the remote past.Primarily follows with me.  He has a h/o PVC ablation 05/29/10, PAF, obesity, sedentary lifestyle, HLD, gout, OSA treated with CPAP, &dysfunctional diaphragm with chronic dyspneapreviouslyfollowed by Dr. [name]- now with Dr. [name]. He has mild CAD per cath back in August of 2011. Labs are checked by primary care.   Seen back in February of 2016 by Dr. [name] - this was for evaluation of dyspnea. Dr. [name] wanted cardiac status reevaluated to assess for any cardiac issues that may be contributing to dyspnea. Was referred for Myoview and echocardiogram. These studies were satisfactory. Encouraged to cut back on his alcohol intake as well.   I have followed him since - he has been holding his own - dyspnea is chronic.He has gotten back with pulmonary - now seeing Dr. [name].Balance issues and has had some falls.Has ended up having documented AF -was placed onanticoagulation - initially with Eliquis - but then transitioned to Coumadin due to costand then back to Eliquisdue to labile INRs. Has also needed some regulation of his BP medicines.When seen in the office in March, he was doing pretty well.   Then had several phone calls prior to last telehealth visit in April- BP low - had to cut back lots of his medicines. Was having tingling in his back and shoulders. At the virtual visit, his wife and daughter were present - they had lots of concerns (on top of his chronic depression) - getting weak, did not look good - dizzy - I ended up sending him to  the office for labs - marked anemia noted - he was admitted and transfused and referred to GI.Eliquis was stopped.He was to have colonoscopy. He postponed some eye surgery. Very limited activity.   Last seen here a month ago - more short of breath - was not getting his rehab due to the pandemic - unsteady. Chronic gastritis noted on EGD and had 3 polyps removed. Remained off Eliquis. He had a follow up with GI to discuss his tests - I checked his labs - his blood count was ok.   The patient does not have symptoms concerning for COVID-19 infection (fever, chills, cough, or new shortness of breath).   Comes in today. Here alone. He says "getting older is hard". He seems to be struggling more. Really wishes he could go back to pulmonary rehab. On oxygen more through out the day now. The hot weather has been hard on him. No chest pain. No palpitations. He saw Dr. Watt Climes - no sign of cancer in the polyps that were removed. [name] is very hesitant and not really wanting to resume anticoagulation given the findings at time of EGD/colon. Also has had lots of epistaxis - just got treated for a sinus infection with Dr. Claiborne Rigg.    Past Medical History:  Diagnosis Date  . Arthritis   . BPH (benign prostatic hyperplasia)   . COPD (chronic obstructive pulmonary disease) ([place])   . Coronary artery ectasia    Mild CAD  with normal systolic function per cath in August of 2011  . Depression   . Diaphragmatic paralysis    felt to be partially responsible for dyspnea  . Dyspnea    due to paralyzed diaphragm and pulmonary issues  . Dysrhythmia   . GERD (gastroesophageal reflux disease)   . Gout   . HTN (hypertension)   . Hypercholesteremia   . Iron deficiency anemia   . Microhematuria   . Obesity   . OSA (obstructive sleep apnea)    CPAP  . Paroxysmal atrial fibrillation (HCC)    occured in the setting of acute E Coli sepsis and ileius 7/12  . PVC (premature ventricular contraction)    s/p PVC  ablation 05/29/2010    Past Surgical History:  Procedure Laterality Date  . APPENDECTOMY  1978  . BIOPSY  02/09/2019   Procedure: BIOPSY;  Surgeon: Clarene Essex, MD;  Location: [facility];  Service: Endoscopy;;  . BIOPSY  03/02/2019   Procedure: BIOPSY;  Surgeon: Clarene Essex, MD;  Location: [facility];  Service: Endoscopy;;  . CARDIAC CATHETERIZATION  04-30-2010   Left main coronary artery is normal.   . CARDIOVASCULAR STRESS TEST  03-19-2010   0%  . CHOLECYSTECTOMY  1990  . COLONOSCOPY N/A 03/02/2019   Procedure: COLONOSCOPY;  Surgeon: Clarene Essex, MD;  Location: [facility];  Service: Endoscopy;  Laterality: N/A;  . COLONOSCOPY W/ POLYPECTOMY    . ESOPHAGOGASTRODUODENOSCOPY (EGD) WITH PROPOFOL N/A 02/09/2019   Procedure: ESOPHAGOGASTRODUODENOSCOPY (EGD) WITH PROPOFOL;  Surgeon: Clarene Essex, MD;  Location: [facility];  Service: Endoscopy;  Laterality: N/A;  . EYE SURGERY Right    catarct  . poly removed from nose as a child    . PVC ablation  05/29/2010  . US ECHOCARDIOGRAPHY  03-09-2010   EF 55-60%     Medications: Current Meds  Medication Sig  . allopurinol (ZYLOPRIM) 300 MG tablet Take 300 mg by mouth daily.    Marland Kitchen buPROPion (WELLBUTRIN SR) 100 MG 12 hr tablet Take 100 mg by mouth 2 (two) times daily.  Marland Kitchen docusate sodium (COLACE) 100 MG capsule Take 100 mg by mouth daily as needed for constipation.  Marland Kitchen doxazosin (CARDURA) 4 MG tablet Take 2 mg by mouth daily.  . ferrous sulfate 325 (65 FE) MG tablet Take 325 mg by mouth daily with breakfast.  . ipratropium (ATROVENT) 0.03 % nasal spray Place 1 spray into both nostrils 2 (two) times daily as needed for congestion.  . lovastatin (MEVACOR) 40 MG tablet Take 40 mg by mouth at bedtime.    . metoprolol tartrate (LOPRESSOR) 25 MG tablet Take 1 tablet (25 mg total) by mouth 2 (two) times daily for 30 days.  . OXYGEN Inhale 2 L into the lungs daily as needed (SOB).   . pantoprazole (PROTONIX) 40 MG tablet Take 40 mg by mouth daily.  .  sertraline (ZOLOFT) 100 MG tablet Take 100 mg by mouth daily.  . traZODone (DESYREL) 100 MG tablet Take 100 mg by mouth at bedtime.    . vitamin B-12 (CYANOCOBALAMIN) 500 MCG tablet Take 500 mcg by mouth daily.     Allergies: No Known Allergies  Social History: The patient  reports that he quit smoking about 34 years ago. His smoking use included cigarettes. He has a 60.00 pack-year smoking history. He has never used smokeless tobacco. He reports current alcohol use of about 7.0 standard drinks of alcohol per week. He reports that he does not use drugs.   Family History: The  patient's family history includes Aneurysm (age of onset: 74) in his father; Breast cancer in his daughter and mother.   Review of Systems: Please see the history of present illness.   All other systems are reviewed and negative.   Physical Exam: VS:  BP (!) 144/62   Pulse 66   Ht 5\' 10"  (1.778 m)   Wt 198 lb 1.9 oz (89.9 kg)   SpO2 95%   BMI 28.43 kg/m  .  BMI Body mass index is 28.43 kg/m.  Wt Readings from Last 3 Encounters:  04/07/19 198 lb 1.9 oz (89.9 kg)  03/09/19 198 lb 3.2 oz (89.9 kg)  03/08/19 198 lb 12.8 oz (90.2 kg)    General: Elderly. Looks chronically ill. Affect flat. Seems pretty depressed. Has portable oxygen in place.   HEENT: Normal.  Neck: Supple, no JVD, carotid bruits, or masses noted.  Cardiac: Regular rate and rhythm. Heart tones are distant. No edema.  Respiratory:  Decreased breath sounds bilaterally with normal work of breathing.  GI: Soft and nontender.  MS: No deformity or atrophy. Gait and ROM intact.  Skin: Warm and dry. Color is normal.  Neuro:  Strength and sensation are intact and no gross focal deficits noted.  Psych: Alert, appropriate and with normal affect.   LABORATORY DATA:  EKG:  EKG is not ordered today.   Lab Results  Component Value Date   WBC 5.5 03/08/2019   HGB 11.5 (L) 03/08/2019   HCT 36.2 (L) 03/08/2019   PLT 189 03/08/2019   GLUCOSE 100  (H) 03/08/2019   ALT 16 01/05/2019   AST 21 01/05/2019   NA 140 03/08/2019   K 4.1 03/08/2019   CL 104 03/08/2019   CREATININE 1.23 03/08/2019   BUN 31 (H) 03/08/2019   CO2 25 03/08/2019   TSH 2.640 01/05/2019   INR 1.3 (H) 01/05/2019       BNP (last 3 results) No results for input(s): BNP in the last 8760 hours.  ProBNP (last 3 results) No results for input(s): PROBNP in the last 8760 hours.   Other Studies Reviewed Today:  Holter 02/2018 Narrative & Impression    Sinus rhythm with first degree AV block, Right bundle branch block Average heart rate is 62 bpm Occasional premature ventricular contractions Frequent premature atrial contractions with nonsustained atrial tachycardia Nonsustained atrial fibrillation also observed Nocturnal bradycardia with mobitz I second degree AV block is noted transiently No sustained arrhythmias No prolonged pauses      Myoview Impression from March 2016 Exercise Capacity: Lexiscan with no exercise. BP Response: Normal blood pressure response. Clinical Symptoms: Nausea. ECG Impression: Atrial fibrillation, no changes from baseline.  Comparison with Prior Nuclear Study: No images to compare  Overall Impression: Low risk stress nuclear study a medium-sized basal inferior and basal to mid inferolateral perfusion defect that is actually worse at rest than with stress. No ischemia. Given normal wall motion, this may represent diaphragmatic attenuation. .  LV Ejection Fraction: 63%. LV Wall Motion: NL LV Function; NL Wall Motion   Loralie Champagne 11/07/2014   Echo Study Conclusions from February 2016  - Left ventricle: The cavity size was normal. Wall thickness was increased in a pattern of mild LVH. Systolic function was normal. The estimated ejection fraction was in the range of 60% to 65%. Wall motion was normal; there were no regional wall motion abnormalities. - Aortic valve: There was trivial  regurgitation. - Left atrium: The atrium was mildly dilated. - Right atrium: The atrium  was mildly dilated.    ASSESSMENT & PLAN:   1. Recent GI bleed- off Eliquis- s/p EGD and colonoscopy. Remains off Eliquis. We have both agreed to stay off anticoagulation. He can restart baby aspirin - used to take just every other day - he understands his risk of stroke is elevated but does not wish to have recurrent GI bleeding and be hospitalized.   2. Pre op clearance for eye surgery -this has been postponed.He says he is not sure if and when he may proceed - this is on hold.   3. HTN -has had multiple medicines stopped previously due to GI bleed/anemia- BP currently ok - basically the same at home - I have left him on his current regimen for now.   4. PAF -noted onpriorHolter - his CHADSVASC is at least 44 (age, HTN, CAD)-he was initially placed on Eliquis -switched toCoumadin due to costand then back to Eliquis due to marked labilityand then stopped due to marked anemia/bleeding.See #1.   5. Chronic dyspnea -He hadcardiac studiesback [id number] whichwere stable.Still can't return to pulmonary rehab due to [place] -  This really helped him from the aspect of his breathing and depression. We will send them a message to see if they have resumed any type of operations.   6. PVCs- past ablation- remains on low dose Toprol.Not endorsed.Rhythm is regular today.   7. Obesity-weight isstable.  8. Depression - long standing issue  9. HLD-on statin therapy.  10. CAD-no active chest pain. Medical management.  11.Prior chronic anticoagulation-remains off at this time. See #1.   12. COVID-19 Education: The signs and symptoms of COVID-19 were discussed with the patient and how to seek care for testing (follow up with PCP or arrange E-visit).  The importance of social distancing, staying at home, hand hygiene and wearing a mask when out in public were discussed  today.  Current medicines are reviewed with the patient today.  The patient does not have concerns regarding medicines other than what has been noted above.  The following changes have been made:  See above.  Labs/ tests ordered today include:   No orders of the defined types were placed in this encounter.    Disposition:   FU with me in about 3 months. Overall prognosis looks tenuous at best.   Patient is agreeable to this plan and will call if any problems develop in the interim.   SignedTruitt Merle, NP  04/07/2019 1:48 PM  [facility] [address] [place], [place]  [address] Phone: [phone] Fax: [phone]

## 2019-04-07 ENCOUNTER — Encounter: Payer: Self-pay | Admitting: Nurse Practitioner

## 2019-04-07 ENCOUNTER — Other Ambulatory Visit: Payer: Self-pay

## 2019-04-07 ENCOUNTER — Ambulatory Visit (INDEPENDENT_AMBULATORY_CARE_PROVIDER_SITE_OTHER): Payer: Medicare Other | Admitting: Nurse Practitioner

## 2019-04-07 VITALS — BP 144/62 | HR 66 | Ht 70.0 in | Wt 198.1 lb

## 2019-04-07 DIAGNOSIS — K922 Gastrointestinal hemorrhage, unspecified: Secondary | ICD-10-CM

## 2019-04-07 DIAGNOSIS — I259 Chronic ischemic heart disease, unspecified: Secondary | ICD-10-CM | POA: Diagnosis not present

## 2019-04-07 DIAGNOSIS — R0602 Shortness of breath: Secondary | ICD-10-CM

## 2019-04-07 DIAGNOSIS — I480 Paroxysmal atrial fibrillation: Secondary | ICD-10-CM | POA: Diagnosis not present

## 2019-04-07 DIAGNOSIS — Z7189 Other specified counseling: Secondary | ICD-10-CM | POA: Diagnosis not present

## 2019-04-07 DIAGNOSIS — Z79899 Other long term (current) drug therapy: Secondary | ICD-10-CM

## 2019-04-07 DIAGNOSIS — I10 Essential (primary) hypertension: Secondary | ICD-10-CM

## 2019-04-07 MED ORDER — ASPIRIN EC 81 MG PO TBEC: 81.0000 mg | DELAYED_RELEASE_TABLET | ORAL | 3 refills | Status: DC

## 2019-04-07 NOTE — Patient Instructions (Addendum)
After Visit Summary:  We will be checking the following labs today - NONE   Medication Instructions:    Continue with your current medicines.   Ok to restart baby aspirin every other day .   If you need a refill on your cardiac medications before your next appointment, please call your pharmacy.     Testing/Procedures To Be Arranged:  N/A  Follow-Up:   See me in about 3 months    At [facility], you and your health needs are our priority.  As part of our continuing mission to provide you with exceptional heart care, we have created designated Provider Care Teams.  These Care Teams include your primary Cardiologist (physician) and Advanced Practice Providers (APPs -  Physician Assistants and Nurse Practitioners) who all work together to provide you with the care you need, when you need it.  Special Instructions:  . Stay safe, stay home, wash your hands for at least 20 seconds and wear a mask when out in public.  . It was good to talk with you today.  . I will let you know what pulmonary rehab says about going back   Call the [place] office at [phone] if you have any questions, problems or concerns.

## 2019-04-12 ENCOUNTER — Telehealth: Payer: Self-pay | Admitting: *Deleted

## 2019-04-12 NOTE — Telephone Encounter (Signed)
How do you go about doing this pt wanted to know and do you know someone.

## 2019-04-12 NOTE — Telephone Encounter (Signed)
I think that would be a good idea.   [name]

## 2019-04-12 NOTE — Telephone Encounter (Signed)
PT needs to go thru his primary care doctor.

## 2019-04-12 NOTE — Telephone Encounter (Signed)
S/w pt to let pt know about pulmonary rehab.  Pt's wife stated today family is getting a stair lift for pt.   Would like a PT person to come out to pt's house to help with pt mobility.Pt would like Lori's recommendation's. Will send to Cecille Rubin to advise.

## 2019-04-12 NOTE — Telephone Encounter (Signed)
Maybe reach out to the medical supply stores - they would probably know.   Cecille Rubin

## 2019-04-12 NOTE — Telephone Encounter (Signed)
-----   Message from Burtis Junes, NP sent at 04/07/2019  4:08 PM EDT ----- Can you call Mr. Begley tomorrow and just pass this info on to him please.   Cecille Rubin ----- Message ----- From: Rowe Pavy, RN Sent: 04/07/2019   3:32 PM EDT To: Burtis Junes, NP  Cecille Rubin, I hate to hear that about [name].  He is such a great guy! [name] had completed his undergraduate pulmonary rehab and was in the pulmonary maintenance program when we closed.  Since he completed undergraduate already this year he would not be able to do it twice in the same year. We have been in contact with our maintenance patients both cardiac and pulmonary during the closure to support them.   We have not planned or determined if we will be able to e entry of our maintenance patients. If we are able to have maintenance patients retrurn he will be contacted.   ----- Message ----- From: Magda Kiel, RN Sent: 04/07/2019   3:01 PM EDT To: Rowe Pavy, RN Subject: Pulmonary Rehab                                 ----- Message ----- From: Burtis Junes, NP Sent: 04/07/2019   1:58 PM EDT To: Magda Kiel, RN  Verdis Frederickson,   Did not know if you could help me - Mr. Werntz was going to pulmonary rehab prior to COVID  Is this open now?   He needs to get back - he seems to have really declined.  Thanks for your help  Cecille Rubin

## 2019-04-12 NOTE — Telephone Encounter (Signed)
Pt is aware pt has to call PCP to get PT for mobility.

## 2019-04-12 NOTE — Telephone Encounter (Signed)
For PT 

## 2019-06-04 NOTE — Progress Notes (Signed)
CARDIOLOGY OFFICE NOTE  Date:  06/08/2019    [name] Date of Birth: 09/08/1934 Medical Record [id number]  PCP:  [name], MD  Cardiologist:  [name]   Chief Complaint  Patient presents with   Follow-up    History of Present Illness: [name] is a 85 y.o. male who presents today for a 2 month check. Seen for Dr. [name]. He has seen Dr. [name] in the remote past.Primarily follows with me.  He has a h/o PVC ablation 05/29/10, PAF, obesity, sedentary lifestyle, HLD, gout, OSA treated with CPAP, &dysfunctional diaphragm with chronic dyspneapreviouslyfollowed by Dr. [name]- now with Dr. [name]. He has mild CAD per cath back in August of 2011. Labs are checked by primary care.   Seen back in February of 2016 by Dr. [name] - this was for evaluation of dyspnea. Dr. [name] wanted cardiac status reevaluated to assess for any cardiac issues that may be contributing to dyspnea. Was referred for Myoview and echocardiogram. These studies were satisfactory. Encouraged to cut back on his alcohol intake as well.   I have followed him since - he has been holding his own - dyspnea is chronic.He has gotten back with pulmonary - Dr. [name].Balance issues and has had some falls.Has ended up having documented AF -was placed onanticoagulation - initially with Eliquis - but then transitioned to Coumadin due to costand then back to Eliquisdue to labile INRs. Has also needed some regulation of his BP medicines.When seen in the office in March, he was doing pretty well.   Then had several phone calls prior to last telehealth visit in April- BP low - had to cut back lots of his medicines. Was having tingling in his back and shoulders. At the virtual visit, his wife and daughter were present - they had lots of concerns (on top of his chronic depression) - getting weak, did not look good - dizzy - I ended up sending him to the office for labs - marked anemia  noted - he was admitted and transfused and referred to GI.Eliquis was stopped.He was to have EGD/colonoscopy. He postponed his eye surgery. Very limited activity.   Seen back here in the office in June and July - more short of breath - had been found to have chronic gastritis on EGD and had 3 polyps removed at time of colonoscopy. Remained off Eliquis - we had a long discussion and together we decided to NOT resume his anticoagulation given his findings at time of EGD/colon and also noting lots of epistaxis. He was really struggling at his last visit - not able to go to rehab due to the pandemic. Getting more depressed.    The patient does not have symptoms concerning for COVID-19 infection (fever, chills, cough, or new shortness of breath).   Comes in today. Here alone. He has had some OT and is getting PT - sounds like thru his insurance - this seems to have helped him some - he seems better today.  Balance is still poor. No falls. No bleeding. No palpitations. No chest pain. His son is still recovering from his stroke and still has lots of recovery ahead. He would like a flu shot today.  He remains short of breath with all activity. He remains on oxygen. He is now using a walker.   Past Medical History:  Diagnosis Date   Arthritis    BPH (benign prostatic hyperplasia)    COPD (chronic obstructive pulmonary disease) ([place])  Coronary artery ectasia    Mild CAD with normal systolic function per cath in August of 2011   Depression    Diaphragmatic paralysis    felt to be partially responsible for dyspnea   Dyspnea    due to paralyzed diaphragm and pulmonary issues   Dysrhythmia    GERD (gastroesophageal reflux disease)    Gout    HTN (hypertension)    Hypercholesteremia    Iron deficiency anemia    Microhematuria    Obesity    OSA (obstructive sleep apnea)    CPAP   Paroxysmal atrial fibrillation ([place])    occured in the setting of acute E Coli sepsis and ileius  7/12   PVC (premature ventricular contraction)    s/p PVC ablation 05/29/2010    Past Surgical History:  Procedure Laterality Date   APPENDECTOMY  1978   BIOPSY  02/09/2019   Procedure: BIOPSY;  Surgeon: Clarene Essex, MD;  Location: [facility];  Service: Endoscopy;;   BIOPSY  03/02/2019   Procedure: BIOPSY;  Surgeon: Clarene Essex, MD;  Location: [facility];  Service: Endoscopy;;   CARDIAC CATHETERIZATION  04-30-2010   Left main coronary artery is normal.    CARDIOVASCULAR STRESS TEST  03-19-2010   0%   CHOLECYSTECTOMY  1990   COLONOSCOPY N/A 03/02/2019   Procedure: COLONOSCOPY;  Surgeon: Clarene Essex, MD;  [facility];  Service: Endoscopy;  Laterality: N/A;   COLONOSCOPY W/ POLYPECTOMY     ESOPHAGOGASTRODUODENOSCOPY (EGD) WITH PROPOFOL N/A 02/09/2019   Procedure: ESOPHAGOGASTRODUODENOSCOPY (EGD) WITH PROPOFOL;  Surgeon: Clarene Essex, MD;  [facility];  Service: Endoscopy;  Laterality: N/A;   EYE SURGERY Right    catarct   poly removed from nose as a child     PVC ablation  05/29/2010   US ECHOCARDIOGRAPHY  03-09-2010   EF 55-60%     Medications: Current Meds  Medication Sig   allopurinol (ZYLOPRIM) 300 MG tablet Take 300 mg by mouth daily.     buPROPion (WELLBUTRIN SR) 100 MG 12 hr tablet Take 100 mg by mouth 2 (two) times daily.   docusate sodium (COLACE) 100 MG capsule Take 100 mg by mouth daily as needed for constipation.   doxazosin (CARDURA) 4 MG tablet Take 2 mg by mouth daily.   ferrous sulfate 325 (65 FE) MG tablet Take 325 mg by mouth daily with breakfast.   ipratropium (ATROVENT) 0.03 % nasal spray Place 1 spray into both nostrils 2 (two) times daily as needed for congestion.   lovastatin (MEVACOR) 40 MG tablet Take 40 mg by mouth at bedtime.     metoprolol tartrate (LOPRESSOR) 25 MG tablet Take 1 tablet (25 mg total) by mouth 2 (two) times daily for 30 days.   OXYGEN Inhale 2 L into the lungs daily as needed (SOB).     pantoprazole (PROTONIX) 40 MG tablet Take 40 mg by mouth daily.   sertraline (ZOLOFT) 100 MG tablet Take 100 mg by mouth daily.   traZODone (DESYREL) 100 MG tablet Take 100 mg by mouth at bedtime.     vitamin B-12 (CYANOCOBALAMIN) 500 MCG tablet Take 500 mcg by mouth daily.     Allergies: No Known Allergies  Social History: The patient  reports that he quit smoking about 34 years ago. His smoking use included cigarettes. He has a 60.00 pack-year smoking history. He has never used smokeless tobacco. He reports current alcohol use of about 7.0 standard drinks of alcohol per week. He reports that he does  not use drugs.   Family History: The patient's family history includes Aneurysm (age of onset: 31) in his father; Breast cancer in his daughter and mother.   Review of Systems: Please see the history of present illness.   All other systems are reviewed and negative.   Physical Exam: VS:  BP 122/64    Pulse 64    Ht 5\' 10"  (1.778 m)    Wt 196 lb 12.8 oz (89.3 kg)    SpO2 94%    BMI 28.24 kg/m  .  BMI Body mass index is 28.24 kg/m.  Wt Readings from Last 3 Encounters:  06/08/19 196 lb 12.8 oz (89.3 kg)  04/07/19 198 lb 1.9 oz (89.9 kg)  03/09/19 198 lb 3.2 oz (89.9 kg)    General: Pleasant. Alert and in no acute distress.  His spirit seems better today.  HEENT: Normal.  Neck: Supple, no JVD, carotid bruits, or masses noted.  Cardiac: Regular rate and rhythm. No murmurs, rubs, or gallops. No edema.  Respiratory:  Decreased breath sounds but with normal work of breathing at rest.  GI: Soft and nontender.  MS: No deformity or atrophy. Gait and ROM intact.  Skin: Warm and dry. Color is normal.  Neuro:  Strength and sensation are intact and no gross focal deficits noted.  Psych: Alert, appropriate and with normal affect.   LABORATORY DATA:  EKG:  EKG is not ordered today.  Lab Results  Component Value Date   WBC 5.5 03/08/2019   HGB 11.5 (L) 03/08/2019   HCT 36.2 (L)  03/08/2019   PLT 189 03/08/2019   GLUCOSE 100 (H) 03/08/2019   ALT 16 01/05/2019   AST 21 01/05/2019   NA 140 03/08/2019   K 4.1 03/08/2019   CL 104 03/08/2019   CREATININE 1.23 03/08/2019   BUN 31 (H) 03/08/2019   CO2 25 03/08/2019   TSH 2.640 01/05/2019   INR 1.3 (H) 01/05/2019       BNP (last 3 results) No results for input(s): BNP in the last 8760 hours.  ProBNP (last 3 results) No results for input(s): PROBNP in the last 8760 hours.   Other Studies Reviewed Today:  Holter 02/2018 Narrative & Impression    Sinus rhythm with first degree AV block, Right bundle branch block Average heart rate is 62 bpm Occasional premature ventricular contractions Frequent premature atrial contractions with nonsustained atrial tachycardia Nonsustained atrial fibrillation also observed Nocturnal bradycardia with mobitz I second degree AV block is noted transiently No sustained arrhythmias No prolonged pauses      Myoview Impression from March 2016 Exercise Capacity: Lexiscan with no exercise. BP Response: Normal blood pressure response. Clinical Symptoms: Nausea. ECG Impression: Atrial fibrillation, no changes from baseline.  Comparison with Prior Nuclear Study: No images to compare  Overall Impression: Low risk stress nuclear study a medium-sized basal inferior and basal to mid inferolateral perfusion defect that is actually worse at rest than with stress. No ischemia. Given normal wall motion, this may represent diaphragmatic attenuation. .  LV Ejection Fraction: 63%. LV Wall Motion: NL LV Function; NL Wall Motion   Loralie Champagne 11/07/2014   Echo Study Conclusions from February 2016  - Left ventricle: The cavity size was normal. Wall thickness was increased in a pattern of mild LVH. Systolic function was normal. The estimated ejection fraction was in the range of 60% to 65%. Wall motion was normal; there were no regional wall  motion abnormalities. - Aortic valve: There was trivial regurgitation. -  Left atrium: The atrium was mildly dilated. - Right atrium: The atrium was mildly dilated.    ASSESSMENT & PLAN:   1. Prior GI bleedearlier this year - he is off all anticoagulation - we have discussed this previously and have both agreed to NOT resume. He understands his increased risk of stroke. He is only on baby aspirin every other day.    2. HTN - BP is fine. No changes made today.   3. Health maintenance - flu shot today - high dose - given  4. PAF - noted on prior Holter - remains in sinus by exam today.   5. Chronic DOE - on oxygen - has gotten some OT and now doing PT - pulmonary rehab was not an option for him.   6. PVCs - prior ablation - only on low dose Toprol.   7. Obesity - not discussed.   8. Depression - seems a little better today.   9. HLD-on statin therapy.  10. CAD-no active chest pain. I would favor a conservative approach.  34. COVID-19 Education: The signs and symptoms of COVID-19 were discussed with the patient and how to seek care for testing (follow up with PCP or arrange E-visit).  The importance of social distancing, staying at home, hand hygiene and wearing a mask when out in public were discussed today.  Current medicines are reviewed with the patient today.  The patient does not have concerns regarding medicines other than what has been noted above.  The following changes have been made:  See above.  Labs/ tests ordered today include:   No orders of the defined types were placed in this encounter.    Disposition:   FU with me in 4 months.   Patient is agreeable to this plan and will call if any problems develop in the interim.   SignedTruitt Merle, NP  06/08/2019 1:55 PM  [facility] [address] [place], [place]  [address] Phone: [phone] Fax: [phone]

## 2019-06-08 ENCOUNTER — Ambulatory Visit (INDEPENDENT_AMBULATORY_CARE_PROVIDER_SITE_OTHER): Payer: Medicare Other | Admitting: Nurse Practitioner

## 2019-06-08 ENCOUNTER — Other Ambulatory Visit: Payer: Self-pay

## 2019-06-08 ENCOUNTER — Encounter: Payer: Self-pay | Admitting: Nurse Practitioner

## 2019-06-08 VITALS — BP 122/64 | HR 64 | Ht 70.0 in | Wt 196.8 lb

## 2019-06-08 DIAGNOSIS — I259 Chronic ischemic heart disease, unspecified: Secondary | ICD-10-CM

## 2019-06-08 DIAGNOSIS — K922 Gastrointestinal hemorrhage, unspecified: Secondary | ICD-10-CM

## 2019-06-08 DIAGNOSIS — R0602 Shortness of breath: Secondary | ICD-10-CM

## 2019-06-08 DIAGNOSIS — I480 Paroxysmal atrial fibrillation: Secondary | ICD-10-CM

## 2019-06-08 DIAGNOSIS — Z7189 Other specified counseling: Secondary | ICD-10-CM

## 2019-06-08 DIAGNOSIS — Z23 Encounter for immunization: Secondary | ICD-10-CM | POA: Diagnosis not present

## 2019-06-08 DIAGNOSIS — I10 Essential (primary) hypertension: Secondary | ICD-10-CM | POA: Diagnosis not present

## 2019-06-08 NOTE — Patient Instructions (Addendum)
After Visit Summary:  We will be checking the following labs today - NONE   Medication Instructions:    Continue with your current medicines.  We will give a flu shot today    If you need a refill on your cardiac medications before your next appointment, please call your pharmacy.     Testing/Procedures To Be Arranged:  N/A  Follow-Up:   See me in about 4 months.     At [facility], you and your health needs are our priority.  As part of our continuing mission to provide you with exceptional heart care, we have created designated Provider Care Teams.  These Care Teams include your primary Cardiologist (physician) and Advanced Practice Providers (APPs -  Physician Assistants and Nurse Practitioners) who all work together to provide you with the care you need, when you need it.  Special Instructions:  . Stay safe, stay home, wash your hands for at least 20 seconds and wear a mask when out in public.  . It was good to talk with you today.    Call the [facility] office at [phone] if you have any questions, problems or concerns.

## 2019-06-09 ENCOUNTER — Ambulatory Visit (INDEPENDENT_AMBULATORY_CARE_PROVIDER_SITE_OTHER): Payer: Medicare Other | Admitting: Pulmonary Disease

## 2019-06-09 ENCOUNTER — Encounter: Payer: Self-pay | Admitting: Pulmonary Disease

## 2019-06-09 VITALS — BP 128/82 | HR 65 | Temp 97.4°F | Ht 70.0 in | Wt 198.2 lb

## 2019-06-09 DIAGNOSIS — G4733 Obstructive sleep apnea (adult) (pediatric): Secondary | ICD-10-CM | POA: Diagnosis not present

## 2019-06-09 MED ORDER — ALBUTEROL SULFATE HFA 108 (90 BASE) MCG/ACT IN AERS: 2.0000 | INHALATION_SPRAY | Freq: Four times a day (QID) | RESPIRATORY_TRACT | 3 refills | Status: DC | PRN

## 2019-06-09 MED ORDER — ANORO ELLIPTA 62.5-25 MCG/INH IN AEPB: 1.0000 | INHALATION_SPRAY | Freq: Every day | RESPIRATORY_TRACT | 3 refills | Status: DC

## 2019-06-09 NOTE — Progress Notes (Signed)
[name]    IY:[id number]    [birth year]  Primary Care Physician:[place], Quillian Quince, MD  Referring Physician: [name], [employer] [place],  [place] [id number]  Chief complaint:   History of chronic obstructive pulmonary disease History of obstructive sleep apnea In for follow-up  HPI:  Shortness of breath on exertion Shortness of breath with activity  Compliant with CPAP use Had to go back to his old machine He stated that his new machine was not working as well -We had added his pressures changed from 5-15 to 10-20 has was claiming that the pressure was not enough -We do not have any compliance data from when the pressure was changed  We did try to check up on his download and we did not find any records of any significant use of a new machine  He has extensive emphysema Severe obstructive disease on PFT from 2018 -He does not want to repeat a PFT at present -He states he is not currently on any inhalers  Has a history of atrial fibrillation, ventricular tachycardia  Outpatient Encounter Medications as of 06/09/2019  Medication Sig  . allopurinol (ZYLOPRIM) 300 MG tablet Take 300 mg by mouth daily.    Marland Kitchen buPROPion (WELLBUTRIN SR) 100 MG 12 hr tablet Take 100 mg by mouth 2 (two) times daily.  Marland Kitchen docusate sodium (COLACE) 100 MG capsule Take 100 mg by mouth daily as needed for constipation.  Marland Kitchen doxazosin (CARDURA) 4 MG tablet Take 2 mg by mouth daily.  . ferrous sulfate 325 (65 FE) MG tablet Take 325 mg by mouth daily with breakfast.  . ipratropium (ATROVENT) 0.03 % nasal spray Place 1 spray into both nostrils 2 (two) times daily as needed for congestion.  . lovastatin (MEVACOR) 40 MG tablet Take 40 mg by mouth at bedtime.    . metoprolol tartrate (LOPRESSOR) 25 MG tablet Take 1 tablet (25 mg total) by mouth 2 (two) times daily for 30 days.  . OXYGEN Inhale 2 L into the lungs daily as needed (SOB).   . pantoprazole (PROTONIX) 40 MG tablet Take 40 mg  by mouth daily.  . sertraline (ZOLOFT) 100 MG tablet Take 100 mg by mouth daily.  . traZODone (DESYREL) 100 MG tablet Take 100 mg by mouth at bedtime.    . vitamin B-12 (CYANOCOBALAMIN) 500 MCG tablet Take 500 mcg by mouth daily.   No facility-administered encounter medications on file as of 06/09/2019.     Allergies as of 06/09/2019  . (No Known Allergies)    Past Medical History:  Diagnosis Date  . Arthritis   . BPH (benign prostatic hyperplasia)   . COPD (chronic obstructive pulmonary disease) ([place])   . Coronary artery ectasia    Mild CAD with normal systolic function per cath in August of 2011  . Depression   . Diaphragmatic paralysis    felt to be partially responsible for dyspnea  . Dyspnea    due to paralyzed diaphragm and pulmonary issues  . Dysrhythmia   . GERD (gastroesophageal reflux disease)   . Gout   . HTN (hypertension)   . Hypercholesteremia   . Iron deficiency anemia   . Microhematuria   . Obesity   . OSA (obstructive sleep apnea)    CPAP  . Paroxysmal atrial fibrillation ([facility])    occured in the setting of acute E Coli sepsis and ileius 7/12  . PVC (premature ventricular contraction)    s/p PVC ablation 05/29/2010  Past Surgical History:  Procedure Laterality Date  . APPENDECTOMY  1978  . BIOPSY  02/09/2019   Procedure: BIOPSY;  Surgeon: Clarene Essex, MD;  [facility];  Service: Endoscopy;;  . BIOPSY  03/02/2019   Procedure: BIOPSY;  Surgeon: Clarene Essex, MD;  Location: [facility];  Service: Endoscopy;;  . CARDIAC CATHETERIZATION  04-30-2010   Left main coronary artery is normal.   . CARDIOVASCULAR STRESS TEST  03-19-2010   0%  . CHOLECYSTECTOMY  1990  . COLONOSCOPY N/A 03/02/2019   Procedure: COLONOSCOPY;  Surgeon: Clarene Essex, MD;  Location[facility];  Service: Endoscopy;  Laterality: N/A;  . COLONOSCOPY W/ POLYPECTOMY    . ESOPHAGOGASTRODUODENOSCOPY (EGD) WITH PROPOFOL N/A 02/09/2019   Procedure: ESOPHAGOGASTRODUODENOSCOPY (EGD) WITH  PROPOFOL;  Surgeon: Clarene Essex, MD;  [facility];  Service: Endoscopy;  Laterality: N/A;  . EYE SURGERY Right    catarct  . poly removed from nose as a child    . PVC ablation  05/29/2010  . US ECHOCARDIOGRAPHY  03-09-2010   EF 55-60%    Family History  Problem Relation Age of Onset  . Aneurysm Father 57       brain aneurysm  . Breast cancer Mother   . Breast cancer Daughter     Social History   Socioeconomic History  . Marital status: Married    Spouse name: Not on file  . Number of children: Not on file  . Years of education: Not on file  . Highest education level: Not on file  Occupational History  . Occupation: Retired  Scientific laboratory technician  . Financial resource strain: Not on file  . Food insecurity    Worry: Not on file    Inability: Not on file  . Transportation needs    Medical: Not on file    Non-medical: Not on file  Tobacco Use  . Smoking status: Former Smoker    Packs/day: 3.00    Years: 20.00    Pack years: 60.00    Types: Cigarettes    Quit date: 09/09/1984    Years since quitting: 34.7  . Smokeless tobacco: Never Used  Substance and Sexual Activity  . Alcohol use: Yes    Alcohol/week: 7.0 standard drinks    Types: 7 Shots of liquor per week  . Drug use: No  . Sexual activity: Yes  Lifestyle  . Physical activity    Days per week: Not on file    Minutes per session: Not on file  . Stress: Not on file  Relationships  . Social Herbalist on phone: Not on file    Gets together: Not on file    Attends religious service: Not on file    Active member of club or organization: Not on file    Attends meetings of clubs or organizations: Not on file    Relationship status: Not on file  . Intimate partner violence    Fear of current or ex partner: Not on file    Emotionally abused: Not on file    Physically abused: Not on file    Forced sexual activity: Not on file  Other Topics Concern  . Not on file  Social History Narrative  . Not on  file    Review of Systems  Constitutional: Positive for fatigue.  HENT: Negative.   Eyes: Negative.   Respiratory: Positive for apnea.   Cardiovascular: Negative for chest pain and palpitations.  Psychiatric/Behavioral: Positive for sleep disturbance.  Vitals:   06/09/19 1006  BP: 128/82  Pulse: 65  Temp: (!) 97.4 F (36.3 C)  SpO2: 98%     Physical Exam  Constitutional: He is oriented to person, place, and time. He appears well-developed and well-nourished.  HENT:  Head: Normocephalic.  Eyes: Pupils are equal, round, and reactive to light. Conjunctivae are normal. Right eye exhibits no discharge.  Neck: Normal range of motion. Neck supple. No tracheal deviation present. No thyromegaly present.  Cardiovascular: Normal rate, regular rhythm and normal heart sounds.  Pulmonary/Chest: Effort normal and breath sounds normal. No respiratory distress. He has no wheezes. He has no rales.  Decreased air movement bilaterally  Abdominal: Soft. Bowel sounds are normal. He exhibits no distension. There is no abdominal tenderness. There is no rebound.  Musculoskeletal: Normal range of motion.        General: No deformity or edema.  Neurological: He is alert and oriented to person, place, and time. He has normal reflexes. No cranial nerve deficit. Coordination normal.  Skin: Skin is warm and dry. No rash noted. No erythema.  Psychiatric: He has a normal mood and affect.   Data Reviewed: Attempted to get compliance data-no data available  Assessment/plan:  .  Obstructive sleep apnea -Went back to using his old machine -No compliance data available -We will adjust his pressure settings to 5-20 -We will see if he is able to use the CPAP then -Follow-up with compliance data -By having a wide latitude on his pressure requirement-the machine should be able to adjust to his pressure needs  Chronic obstructive pulmonary disease Advanced COPD, stage IV COPD -Has not been on any  bronchodilators as stated -We will start him on Anoro -Albuterol as needed   Chronic respiratory failure -Continue using oxygen supplementation  I will see him back in the office in about 3 months  Sherrilyn Rist MD  Pulmonary and Critical Care 06/09/2019, 10:28 AM  CC: [name], MD

## 2019-06-09 NOTE — Patient Instructions (Addendum)
Obstructive sleep apnea -Continue with your CPAP -We will try and reach out to the medical supply company to get some information  COPD -We will give you inhalers -Anoro daily -Albuterol PRN  Continue physical therapy Maintain regular exercise schedule  I will see you back in 3 months

## 2019-06-28 ENCOUNTER — Encounter: Payer: Self-pay | Admitting: *Deleted

## 2019-07-19 ENCOUNTER — Telehealth: Payer: Self-pay | Admitting: Pulmonary Disease

## 2019-07-19 NOTE — Telephone Encounter (Signed)
Called and spoke to [employer], Tracy. She stated the CMN that was needed was corrected and sent back but was not initialed at the corrections. They need the CMN to be initialed and will be faxing over another copy.    Routing to Henrietta T to follow up on so that Dr. Jenetta Downer gets it.  Thanks!

## 2019-07-21 NOTE — Telephone Encounter (Signed)
Spoke with Deseree. She stated that [employer] has not received the CMN form yet.   Rodena Piety and Lattie Haw, have you all seen this CMN?

## 2019-07-21 NOTE — Telephone Encounter (Signed)
Dr. Ander Slade signed this form yesterday and it was faxed to [facility] yesterday. We had to wait for Dr. Ander Slade to be in the office for him to sign the form

## 2019-09-20 ENCOUNTER — Ambulatory Visit: Payer: Medicare Other | Admitting: Nurse Practitioner

## 2019-09-24 NOTE — Progress Notes (Signed)
CARDIOLOGY OFFICE NOTE  Date:  09/27/2019    [name] Date of Birth: 12/31/1933 Medical Record [id number]  PCP:  [name], MD  Cardiologist:  [name]  Chief Complaint  Patient presents with  . Follow-up    Seen for Dr.  [name]    History of Present Illness: [name] is a 86 y.o. male who presents today for a 4 month check.  Seen for Dr. [name]. He has seen Dr. [name] in the remote past.Primarily follows with me.  He has a h/o PVC ablation 05/29/10, PAF, obesity, sedentary lifestyle, HLD, gout, OSA treated with CPAP, &dysfunctional diaphragm with chronic dyspneapreviouslyfollowed by Dr. [name]- now with Dr. [name]. He has mild CAD per cath back in August of 2011. Labs are checked by primary care.   Seen back in February of 2016 by Dr. [name] - this was for evaluation of dyspnea. Dr. [name] wanted cardiac status reevaluated to assess for any cardiac issues that may be contributing to dyspnea. Was referred for Myoview and echocardiogram. These studies were satisfactory. Encouraged to cut back on his alcohol intake as well.   I have followed him since - he has been holding his own - dyspnea is chronic.He has gotten back with pulmonary - Dr. [name].Balance issues and has had some falls.Has ended up having documented AF -was placed onanticoagulation - initially with Eliquis - but then transitioned to Coumadin due to costand then back to Eliquisbut was stopped due to GI bleeding - had chronic gastritis on EGD & 3 polyps removed - had required transfusion. Had had lots of epistaxis as well. Really struggling with the pandemic - not able to go to rehab - last seen in September and felt to be doing ok from our standpoint. Remains pretty short of breath with all activity - he was on oxygen and had transitioned to a walker.   The patient does not have symptoms concerning for COVID-19 infection (fever, chills, cough, or new shortness of  breath).   Comes in today. Here alone. He is getting some PT - for his balance - he does not think it has really helped. Has had at least 2 falls since last visit here. Last one about 3 to 4 weeks - hit his head and his knee. No fracture. He wishes he has more energy. No chest pain. Breathing is unchanged. He is going to [facility] for his first COVID vaccine. No real palpitations. His son is slowly recovering from his stroke - lives out in [place]. Using oxygen - primarily just at night. He is using a cane today. No bleeding.   Past Medical History:  Diagnosis Date  . Arthritis   . BPH (benign prostatic hyperplasia)   . COPD (chronic obstructive pulmonary disease) ([place])   . Coronary artery ectasia    Mild CAD with normal systolic function per cath in August of 2011  . Depression   . Diaphragmatic paralysis    felt to be partially responsible for dyspnea  . Dyspnea    due to paralyzed diaphragm and pulmonary issues  . Dysrhythmia   . GERD (gastroesophageal reflux disease)   . Gout   . HTN (hypertension)   . Hypercholesteremia   . Iron deficiency anemia   . Microhematuria   . Obesity   . OSA (obstructive sleep apnea)    CPAP  . Paroxysmal atrial fibrillation (HCC)    occured in the setting of acute E Coli sepsis and ileius 7/12  .  PVC (premature ventricular contraction)    s/p PVC ablation 05/29/2010    Past Surgical History:  Procedure Laterality Date  . APPENDECTOMY  1978  . BIOPSY  02/09/2019   Procedure: BIOPSY;  Surgeon: Clarene Essex, MD;  Location: [facility];  Service: Endoscopy;;  . BIOPSY  03/02/2019   Procedure: BIOPSY;  Surgeon: Clarene Essex, MD;  Location: [facility];  Service: Endoscopy;;  . CARDIAC CATHETERIZATION  04-30-2010   Left main coronary artery is normal.   . CARDIOVASCULAR STRESS TEST  03-19-2010   0%  . CHOLECYSTECTOMY  1990  . COLONOSCOPY N/A 03/02/2019   Procedure: COLONOSCOPY;  Surgeon: Clarene Essex, MD;  Location: [facility];  Service:  Endoscopy;  Laterality: N/A;  . COLONOSCOPY W/ POLYPECTOMY    . ESOPHAGOGASTRODUODENOSCOPY (EGD) WITH PROPOFOL N/A 02/09/2019   Procedure: ESOPHAGOGASTRODUODENOSCOPY (EGD) WITH PROPOFOL;  Surgeon: Clarene Essex, MD;  Location: [facility];  Service: Endoscopy;  Laterality: N/A;  . EYE SURGERY Right    catarct  . poly removed from nose as a child    . PVC ablation  05/29/2010  . US ECHOCARDIOGRAPHY  03-09-2010   EF 55-60%     Medications: Current Meds  Medication Sig  . albuterol (VENTOLIN HFA) 108 (90 Base) MCG/ACT inhaler Inhale 2 puffs into the lungs every 6 (six) hours as needed for wheezing or shortness of breath.  . allopurinol (ZYLOPRIM) 300 MG tablet Take 300 mg by mouth daily.    Marland Kitchen buPROPion (WELLBUTRIN SR) 100 MG 12 hr tablet Take 100 mg by mouth 2 (two) times daily.  Marland Kitchen docusate sodium (COLACE) 100 MG capsule Take 100 mg by mouth daily as needed for constipation.  Marland Kitchen doxazosin (CARDURA) 4 MG tablet Take 2 mg by mouth daily.  . ferrous sulfate 325 (65 FE) MG tablet Take 325 mg by mouth daily with breakfast.  . ipratropium (ATROVENT) 0.03 % nasal spray Place 1 spray into both nostrils 2 (two) times daily as needed for congestion.  . lovastatin (MEVACOR) 40 MG tablet Take 40 mg by mouth at bedtime.    . metoprolol tartrate (LOPRESSOR) 25 MG tablet Take 1 tablet (25 mg total) by mouth 2 (two) times daily for 30 days.  . OXYGEN Inhale 2 L into the lungs daily as needed (SOB).   . pantoprazole (PROTONIX) 40 MG tablet Take 40 mg by mouth daily.  . sertraline (ZOLOFT) 100 MG tablet Take 100 mg by mouth daily.  . traZODone (DESYREL) 100 MG tablet Take 100 mg by mouth at bedtime.    Marland Kitchen umeclidinium-vilanterol (ANORO ELLIPTA) 62.5-25 MCG/INH AEPB Inhale 1 puff into the lungs daily.  . vitamin B-12 (CYANOCOBALAMIN) 500 MCG tablet Take 500 mcg by mouth daily.     Allergies: No Known Allergies  Social History: The patient  reports that he quit smoking about 35 years ago. His smoking use  included cigarettes. He has a 60.00 pack-year smoking history. He has never used smokeless tobacco. He reports current alcohol use of about 7.0 standard drinks of alcohol per week. He reports that he does not use drugs.   Family History: The patient's family history includes Aneurysm (age of onset: 60) in his father; Breast cancer in his daughter and mother.   Review of Systems: Please see the history of present illness.   All other systems are reviewed and negative.   Physical Exam: VS:  BP (!) 142/86   Pulse 60   Ht 5\' 10"  (1.778 m)   Wt 202 lb (91.6 kg)  SpO2 93%   BMI 28.98 kg/m  .  BMI Body mass index is 28.98 kg/m.  Wt Readings from Last 3 Encounters:  09/27/19 202 lb (91.6 kg)  06/09/19 198 lb 3.2 oz (89.9 kg)  06/08/19 196 lb 12.8 oz (89.3 kg)    General: Alert and in no acute distress. His affect remains pretty flat.   HEENT: Normal.  Neck: Supple, no JVD, carotid bruits, or masses noted.  Cardiac: Regular rate and rhythm. No murmurs, rubs, or gallops. No edema.  Respiratory:  Decreased breath sounds.  GI: Soft and nontender.  MS: No deformity or atrophy. Gait and ROM intact. Using a cane today.  Skin: Warm and dry. Color is normal.  Neuro:  Strength and sensation are intact and no gross focal deficits noted.  Psych: Alert, appropriate and with normal affect.   LABORATORY DATA:  EKG:  EKG is not ordered today.   Lab Results  Component Value Date   WBC 5.5 03/08/2019   HGB 11.5 (L) 03/08/2019   HCT 36.2 (L) 03/08/2019   PLT 189 03/08/2019   GLUCOSE 100 (H) 03/08/2019   ALT 16 01/05/2019   AST 21 01/05/2019   NA 140 03/08/2019   K 4.1 03/08/2019   CL 104 03/08/2019   CREATININE 1.23 03/08/2019   BUN 31 (H) 03/08/2019   CO2 25 03/08/2019   TSH 2.640 01/05/2019   INR 1.3 (H) 01/05/2019       BNP (last 3 results) No results for input(s): BNP in the last 8760 hours.  ProBNP (last 3 results) No results for input(s): PROBNP in the last 8760 hours.    Other Studies Reviewed Today:  [place] 02/2018 Narrative & Impression    Sinus rhythm with first degree AV block, Right bundle branch block Average heart rate is 62 bpm Occasional premature ventricular contractions Frequent premature atrial contractions with nonsustained atrial tachycardia Nonsustained atrial fibrillation also observed Nocturnal bradycardia with mobitz I second degree AV block is noted transiently No sustained arrhythmias No prolonged pauses      Myoview Impression from March 2016 Exercise Capacity: Lexiscan with no exercise. BP Response: Normal blood pressure response. Clinical Symptoms: Nausea. ECG Impression: Atrial fibrillation, no changes from baseline.  Comparison with Prior Nuclear Study: No images to compare  Overall Impression: Low risk stress nuclear study a medium-sized basal inferior and basal to mid inferolateral perfusion defect that is actually worse at rest than with stress. No ischemia. Given normal wall motion, this may represent diaphragmatic attenuation. .  LV Ejection Fraction: 63%. LV Wall Motion: NL LV Function; NL Wall Motion   Loralie Champagne 11/07/2014   Echo Study Conclusions from February 2016  - Left ventricle: The cavity size was normal. Wall thickness was increased in a pattern of mild LVH. Systolic function was normal. The estimated ejection fraction was in the range of 60% to 65%. Wall motion was normal; there were no regional wall motion abnormalities. - Aortic valve: There was trivial regurgitation. - Left atrium: The atrium was mildly dilated. - Right atrium: The atrium was mildly dilated.    ASSESSMENT & PLAN:   1. PAF - no longer on anticoagulation - has had prior GI bleeding - now with falls - remains in NSR by exam today.   2. Prior GI bleed - no plans to restart anticoagulation other than baby aspirin. Not currently taking - not clear as to why - restarting today.  He will call if  problems arise.   3. HTN - BP is  fair - would follow.   4. Health maintenance - getting 1st COVID shot tomorrow.   5. Chronic DOE - primary issue - seems to be holding his own.   6. PVCs - prior ablation - on low dose beta blocker.   7. Obesity - this will not be changing.   8. Depression - long standing issue.   9. HLD - on statin - labs from [facility] noted from earlier this month.   10. CAD-no active chest pain - would favor conservative management.   51. COVID-19 Education: The signs and symptoms of COVID-19 were discussed with the patient and how to seek care for testing (follow up with PCP or arrange E-visit).  The importance of social distancing, staying at home, hand hygiene and wearing a mask when out in public were discussed today. Getting his first vaccine in [place] tomorrow - he is going to try and schedule his second in [place].   Current medicines are reviewed with the patient today.  The patient does not have concerns regarding medicines other than what has been noted above.  The following changes have been made:  See above.  Labs/ tests ordered today include:   No orders of the defined types were placed in this encounter.    Disposition:   FU with me in 4 months.   Patient is agreeable to this plan and will call if any problems develop in the interim.   SignedTruitt Merle, NP  09/27/2019 10:23 AM  [facility] [address] [place], [place]  [address] Phone: [phone] Fax: [phone]

## 2019-09-27 ENCOUNTER — Encounter: Payer: Self-pay | Admitting: Nurse Practitioner

## 2019-09-27 ENCOUNTER — Ambulatory Visit: Payer: Medicare Other | Admitting: Nurse Practitioner

## 2019-09-27 ENCOUNTER — Other Ambulatory Visit: Payer: Self-pay

## 2019-09-27 VITALS — BP 142/86 | HR 60 | Ht 70.0 in | Wt 202.0 lb

## 2019-09-27 DIAGNOSIS — I259 Chronic ischemic heart disease, unspecified: Secondary | ICD-10-CM | POA: Diagnosis not present

## 2019-09-27 DIAGNOSIS — R0602 Shortness of breath: Secondary | ICD-10-CM | POA: Diagnosis not present

## 2019-09-27 DIAGNOSIS — Z7189 Other specified counseling: Secondary | ICD-10-CM

## 2019-09-27 DIAGNOSIS — I10 Essential (primary) hypertension: Secondary | ICD-10-CM

## 2019-09-27 DIAGNOSIS — I480 Paroxysmal atrial fibrillation: Secondary | ICD-10-CM | POA: Diagnosis not present

## 2019-09-27 MED ORDER — ASPIRIN EC 81 MG PO TBEC: 81.0000 mg | DELAYED_RELEASE_TABLET | Freq: Every day | ORAL | Status: DC

## 2019-09-27 NOTE — Patient Instructions (Addendum)
After Visit Summary:  We will be checking the following labs today - NONE   Medication Instructions:    Continue with your current medicines.    If you need a refill on your cardiac medications before your next appointment, please call your pharmacy.     Testing/Procedures To Be Arranged:  N/A  Follow-Up:   See me in 4 months.     At [facility], you and your health needs are our priority.  As part of our continuing mission to provide you with exceptional heart care, we have created designated Provider Care Teams.  These Care Teams include your primary Cardiologist (physician) and Advanced Practice Providers (APPs -  Physician Assistants and Nurse Practitioners) who all work together to provide you with the care you need, when you need it.  Special Instructions:  . Stay safe, stay home, wash your hands for at least 20 seconds and wear a mask when out in public.  . It was good to talk with you today.   We are recommending the COVID 19 vaccine to ALL of our patients. Cardiac medications (including blood thinners) should not deter anyone from being vaccinated. There is no need to hold any of these medicines prior to vaccine administration.   If you are interested in the COVID 19 vaccine - call the [facility] Department at [phone] and select Option #2. Appointments can be arranged at various locations but eventually all will be moved to the [facility]. No walk-ins will be accepted.   Visit [email or web address] and clinic on the "COVID 19 Vaccine Info" for more information.   COVID-19 Vaccine Information can also be found at: ShippingScam.co.uk   For questions related to vaccine distribution or appointments, please email [email or web address] or call [phone].   .    Call the [place] office at [phone] if you have any questions, problems or concerns.

## 2019-10-21 ENCOUNTER — Ambulatory Visit: Payer: Medicare Other

## 2019-11-05 ENCOUNTER — Ambulatory Visit: Payer: Medicare Other | Attending: Internal Medicine

## 2019-11-05 DIAGNOSIS — Z20822 Contact with and (suspected) exposure to covid-19: Secondary | ICD-10-CM

## 2019-11-06 LAB — NOVEL CORONAVIRUS, NAA: SARS-CoV-2, NAA: NOT DETECTED

## 2019-11-14 ENCOUNTER — Emergency Department (HOSPITAL_COMMUNITY)
Admission: EM | Admit: 2019-11-14 | Discharge: 2019-11-14 | Disposition: A | Discharge status: Home / Self Care | Payer: Medicare Other | Attending: Emergency Medicine | Admitting: Emergency Medicine

## 2019-11-14 ENCOUNTER — Other Ambulatory Visit: Payer: Self-pay

## 2019-11-14 ENCOUNTER — Emergency Department (HOSPITAL_COMMUNITY): Payer: Medicare Other

## 2019-11-14 DIAGNOSIS — J449 Chronic obstructive pulmonary disease, unspecified: Secondary | ICD-10-CM | POA: Diagnosis not present

## 2019-11-14 DIAGNOSIS — Z9981 Dependence on supplemental oxygen: Secondary | ICD-10-CM | POA: Insufficient documentation

## 2019-11-14 DIAGNOSIS — R0602 Shortness of breath: Secondary | ICD-10-CM | POA: Diagnosis present

## 2019-11-14 DIAGNOSIS — R05 Cough: Secondary | ICD-10-CM | POA: Insufficient documentation

## 2019-11-14 DIAGNOSIS — I480 Paroxysmal atrial fibrillation: Secondary | ICD-10-CM | POA: Diagnosis not present

## 2019-11-14 DIAGNOSIS — U071 COVID-19: Secondary | ICD-10-CM

## 2019-11-14 DIAGNOSIS — I119 Hypertensive heart disease without heart failure: Secondary | ICD-10-CM | POA: Diagnosis not present

## 2019-11-14 DIAGNOSIS — Z79899 Other long term (current) drug therapy: Secondary | ICD-10-CM | POA: Diagnosis not present

## 2019-11-14 DIAGNOSIS — I255 Ischemic cardiomyopathy: Secondary | ICD-10-CM | POA: Insufficient documentation

## 2019-11-14 LAB — COMPREHENSIVE METABOLIC PANEL
ALT: 25 U/L (ref 0–44)
AST: 41 U/L (ref 15–41)
Albumin: 4.2 g/dL (ref 3.5–5.0)
Alkaline Phosphatase: 50 U/L (ref 38–126)
Anion gap: 13 (ref 5–15)
BUN: 18 mg/dL (ref 8–23)
CO2: 25 mmol/L (ref 22–32)
Calcium: 9.2 mg/dL (ref 8.9–10.3)
Chloride: 96 mmol/L — ABNORMAL LOW (ref 98–111)
Creatinine, Ser: 1.06 mg/dL (ref 0.61–1.24)
GFR calc Af Amer: >60 mL/min (ref >60)
GFR calc non Af Amer: >60 mL/min (ref >60)
Glucose, Bld: 126 mg/dL — ABNORMAL HIGH (ref 70–99)
Potassium: 3.6 mmol/L (ref 3.5–5.1)
Sodium: 134 mmol/L — ABNORMAL LOW (ref 135–145)
Total Bilirubin: 0.8 mg/dL (ref 0.3–1.2)
Total Protein: 7.1 g/dL (ref 6.5–8.1)

## 2019-11-14 LAB — CBC WITH DIFFERENTIAL/PLATELET
Abs Immature Granulocytes: 0.02 10*3/uL (ref 0.00–0.07)
Basophils Absolute: 0 10*3/uL (ref 0.0–0.1)
Basophils Relative: 1 %
Eosinophils Absolute: 0 10*3/uL (ref 0.0–0.5)
Eosinophils Relative: 0 %
HCT: 42.7 % (ref 39.0–52.0)
Hemoglobin: 13.7 g/dL (ref 13.0–17.0)
Immature Granulocytes: 0 %
Lymphocytes Relative: 16 %
Lymphs Abs: 0.9 10*3/uL (ref 0.7–4.0)
MCH: 31.6 pg (ref 26.0–34.0)
MCHC: 32.1 g/dL (ref 30.0–36.0)
MCV: 98.4 fL (ref 80.0–100.0)
Monocytes Absolute: 0.5 10*3/uL (ref 0.1–1.0)
Monocytes Relative: 9 %
Neutro Abs: 4.1 10*3/uL (ref 1.7–7.7)
Neutrophils Relative %: 74 %
Platelets: 126 10*3/uL — ABNORMAL LOW (ref 150–400)
RBC: 4.34 MIL/uL (ref 4.22–5.81)
RDW: 15 % (ref 11.5–15.5)
WBC: 5.6 10*3/uL (ref 4.0–10.5)
nRBC: 0 % (ref 0.0–0.2)

## 2019-11-14 LAB — TROPONIN I (HIGH SENSITIVITY)
Troponin I (High Sensitivity): 31 ng/L — ABNORMAL HIGH (ref <18)
Troponin I (High Sensitivity): 32 ng/L — ABNORMAL HIGH (ref <18)

## 2019-11-14 LAB — RESPIRATORY PANEL BY RT PCR (FLU A&B, COVID)
Influenza A by PCR: NEGATIVE
Influenza B by PCR: NEGATIVE
SARS Coronavirus 2 by RT PCR: POSITIVE — AB

## 2019-11-14 LAB — BRAIN NATRIURETIC PEPTIDE: B Natriuretic Peptide: 1615.9 pg/mL — ABNORMAL HIGH (ref 0.0–100.0)

## 2019-11-14 MED ORDER — PREDNISONE 20 MG PO TABS: 40.0000 mg | ORAL_TABLET | Freq: Every day | ORAL | 0 refills | Status: DC

## 2019-11-14 MED ORDER — ALBUTEROL SULFATE HFA 108 (90 BASE) MCG/ACT IN AERS
8.0000 | INHALATION_SPRAY | Freq: Once | RESPIRATORY_TRACT | Status: AC
  Administered 2019-11-14: 8 via RESPIRATORY_TRACT
  Filled 2019-11-14: qty 6.7

## 2019-11-14 MED ORDER — DEXAMETHASONE SODIUM PHOSPHATE 10 MG/ML IJ SOLN
10.0000 mg | Freq: Once | INTRAMUSCULAR | Status: AC
  Administered 2019-11-14: 10 mg via INTRAVENOUS
  Filled 2019-11-14: qty 1

## 2019-11-14 MED ORDER — FUROSEMIDE 10 MG/ML IJ SOLN
40.0000 mg | Freq: Once | INTRAMUSCULAR | Status: AC
  Administered 2019-11-14: 40 mg via INTRAVENOUS
  Filled 2019-11-14: qty 4

## 2019-11-14 NOTE — ED Notes (Signed)
Pt sitting on end of bed using phone

## 2019-11-14 NOTE — ED Triage Notes (Signed)
Per [facility] pt from home with SOB, body aches, chills, diarrhea and weakness x6 days. Hx of COPD wears 3L [place] PRN, lungs diminished through out. 93% 3L [place] 95% on 4L [place]   BP 200/140 HR 90 RR 20   20g LFA

## 2019-11-14 NOTE — ED Provider Notes (Signed)
[facility] EMERGENCY DEPARTMENT Provider Note   CSN: [id number] Arrival date & time: 11/14/19  [place]     History Chief Complaint  Patient presents with  . Shortness of Breath    [name] is a 86 y.o. male with history of COPD, ischemic cardiomyopathy, PAF not on blood thinners who presents with SOB. He states that SOB has been gradually worsening for one week. He reports associated body aches. He denies fever chills, chest pain. He had a "little" diarrhea a couple days ago and associated abdominal pain which has resolved. He has a mild non-productive cough as well. He uses O2 as needed but has been using it constantly at 4L. Per chart review echo in April 2020 showed LVEF 60-65%, normal wall motion, mild LVH, dilated IVC.   HPI     Past Medical History:  Diagnosis Date  . Arthritis   . BPH (benign prostatic hyperplasia)   . COPD (chronic obstructive pulmonary disease) ([place])   . Coronary artery ectasia    Mild CAD with normal systolic function per cath in August of 2011  . Depression   . Diaphragmatic paralysis    felt to be partially responsible for dyspnea  . Dyspnea    due to paralyzed diaphragm and pulmonary issues  . Dysrhythmia   . GERD (gastroesophageal reflux disease)   . Gout   . HTN (hypertension)   . Hypercholesteremia   . Iron deficiency anemia   . Microhematuria   . Obesity   . OSA (obstructive sleep apnea)    CPAP  . Paroxysmal atrial fibrillation (HCC)    occured in the setting of acute E Coli sepsis and ileius 7/12  . PVC (premature ventricular contraction)    s/p PVC ablation 05/29/2010    Patient Active Problem List   Diagnosis Date Noted  . Iron deficiency anemia due to chronic blood loss 01/06/2019  . Orthostatic hypotension 01/05/2019    Class: Question of  . Normocytic normochromic anemia 01/05/2019  . Near syncope 01/05/2019  . Chronic respiratory failure with hypoxia ([place]) 06/30/2018  . OSA (obstructive sleep apnea)  02/11/2018  . Physical deconditioning 08/11/2017  . Shortness of breath 10/24/2014  . COPD (chronic obstructive pulmonary disease) with emphysema ([place]) 09/01/2014  . Fatigue 10/14/2011  . Atrial fibrillation ([place]) 05/15/2011  . Premature ventricular contractions (PVCs) (VPCs) 02/25/2011  . 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  . PERSISTENT DISORDER INITIATING/MAINTAINING SLEEP 05/10/2010  . HYPERCHOLESTEROLEMIA 05/09/2010  . GOUT 05/09/2010  . OBESITY 05/09/2010  . Obstructive sleep apnea 05/09/2010  . Essential hypertension 05/09/2010  . VENTRICULAR TACHYCARDIA 05/09/2010    Past Surgical History:  Procedure Laterality Date  . APPENDECTOMY  1978  . BIOPSY  02/09/2019   Procedure: BIOPSY;  Surgeon: Clarene Essex, MD;  Location: [facility];  Service: Endoscopy;;  . BIOPSY  03/02/2019   Procedure: BIOPSY;  Surgeon: Clarene Essex, MD;  Location: [facility];  Service: Endoscopy;;  . CARDIAC CATHETERIZATION  04-30-2010   Left main coronary artery is normal.   . CARDIOVASCULAR STRESS TEST  03-19-2010   0%  . CHOLECYSTECTOMY  1990  . COLONOSCOPY N/A 03/02/2019   Procedure: COLONOSCOPY;  Surgeon: Clarene Essex, MD;  Location: [facility];  Service: Endoscopy;  Laterality: N/A;  . COLONOSCOPY W/ POLYPECTOMY    . ESOPHAGOGASTRODUODENOSCOPY (EGD) WITH PROPOFOL N/A 02/09/2019   Procedure: ESOPHAGOGASTRODUODENOSCOPY (EGD) WITH PROPOFOL;  Surgeon: Clarene Essex, MD;  Location: [facility];  Service: Endoscopy;  Laterality: N/A;  . EYE SURGERY Right  catarct  . poly removed from nose as a child    . PVC ablation  05/29/2010  . US ECHOCARDIOGRAPHY  03-09-2010   EF 55-60%       Family History  Problem Relation Age of Onset  . Aneurysm Father 9       brain aneurysm  . Breast cancer Mother   . Breast cancer Daughter     Social History   Tobacco Use  . Smoking status: Former Smoker    Packs/day: 3.00    Years: 20.00    Pack years: 60.00    Types: Cigarettes    Quit date: 09/09/1984    Years  since quitting: 35.2  . Smokeless tobacco: Never Used  Substance Use Topics  . Alcohol use: Yes    Alcohol/week: 7.0 standard drinks    Types: 7 Shots of liquor per week  . Drug use: No    Home Medications Prior to Admission medications   Medication Sig Start Date End Date Taking? Authorizing Provider  albuterol (VENTOLIN HFA) 108 (90 Base) MCG/ACT inhaler Inhale 2 puffs into the lungs every 6 (six) hours as needed for wheezing or shortness of breath. 06/09/19   Olalere, Cicero Duck A, MD  allopurinol (ZYLOPRIM) 300 MG tablet Take 300 mg by mouth daily.      [provider]  aspirin EC 81 MG tablet Take 1 tablet (81 mg total) by mouth daily. 09/27/19   Burtis Junes, NP  buPROPion (WELLBUTRIN SR) 100 MG 12 hr tablet Take 100 mg by mouth 2 (two) times daily.    [provider]  docusate sodium (COLACE) 100 MG capsule Take 100 mg by mouth daily as needed for constipation. 01/12/19   [provider]  doxazosin (CARDURA) 4 MG tablet Take 2 mg by mouth daily.    [provider]  ferrous sulfate 325 (65 FE) MG tablet Take 325 mg by mouth daily with breakfast.    [provider]  ipratropium (ATROVENT) 0.03 % nasal spray Place 1 spray into both nostrils 2 (two) times daily as needed for congestion. 01/12/19   [provider]  lovastatin (MEVACOR) 40 MG tablet Take 40 mg by mouth at bedtime.      [provider]  metoprolol tartrate (LOPRESSOR) 25 MG tablet Take 1 tablet (25 mg total) by mouth 2 (two) times daily for 30 days. 01/07/19 02/25/20  Barb Merino, MD  OXYGEN Inhale 2 L into the lungs daily as needed (SOB).     [provider]  pantoprazole (PROTONIX) 40 MG tablet Take 40 mg by mouth daily.    [provider]  sertraline (ZOLOFT) 100 MG tablet Take 100 mg by mouth daily. 09/14/14   [provider]  traZODone (DESYREL) 100 MG tablet Take 100 mg by mouth at bedtime.      [provider]    umeclidinium-vilanterol (ANORO ELLIPTA) 62.5-25 MCG/INH AEPB Inhale 1 puff into the lungs daily. 06/09/19   Laurin Coder, MD  vitamin B-12 (CYANOCOBALAMIN) 500 MCG tablet Take 500 mcg by mouth daily.    [provider]    Allergies    Patient has no known allergies.  Review of Systems   Review of Systems  Constitutional: Positive for activity change, appetite change and fatigue. Negative for fever.  Respiratory: Positive for cough and shortness of breath. Negative for wheezing.   Cardiovascular: Positive for leg swelling (chronic). Negative for chest pain and palpitations.  Gastrointestinal: Positive for abdominal pain and diarrhea (resolved).  Negative for nausea and vomiting.  Musculoskeletal: Positive for myalgias.  Neurological: Positive for weakness.  All other systems reviewed and are negative.   Physical Exam Updated Vital Signs BP (!) 190/110 (BP Location: Right Arm)   Pulse 90   Temp 100 F (37.8 C) (Oral)   Resp (!) 26   Ht 5\' 10"  (1.778 m)   Wt 91.6 kg   SpO2 100%   BMI 28.98 kg/m   Physical Exam Vitals and nursing note reviewed.  Constitutional:      General: He is not in acute distress.    Appearance: He is well-developed. He is obese. He is not ill-appearing.  HENT:     Head: Normocephalic and atraumatic.  Eyes:     General: No scleral icterus.       Right eye: No discharge.        Left eye: No discharge.     Conjunctiva/sclera: Conjunctivae normal.     Pupils: Pupils are equal, round, and reactive to light.  Cardiovascular:     Rate and Rhythm: Normal rate and regular rhythm.  Pulmonary:     Effort: Tachypnea and accessory muscle usage present. No respiratory distress.     Breath sounds: Decreased breath sounds present.  Abdominal:     General: There is no distension.  Musculoskeletal:     Cervical back: Normal range of motion.     Right lower leg: No edema.     Left lower leg: Edema (1+ pitting edema to the shin. pt states this leg  is always more swollen) present.  Skin:    General: Skin is warm and dry.  Neurological:     Mental Status: He is alert and oriented to person, place, and time.  Psychiatric:        Behavior: Behavior normal.     ED Results / Procedures / Treatments   Labs (all labs ordered are listed, but only abnormal results are displayed) Labs Reviewed  RESPIRATORY PANEL BY RT PCR (FLU A&B, COVID) - Abnormal; Notable for the following components:      Result Value   SARS Coronavirus 2 by RT PCR POSITIVE (*)    All other components within normal limits  CBC WITH DIFFERENTIAL/PLATELET - Abnormal; Notable for the following components:   Platelets 126 (*)    All other components within normal limits  COMPREHENSIVE METABOLIC PANEL - Abnormal; Notable for the following components:   Sodium 134 (*)    Chloride 96 (*)    Glucose, Bld 126 (*)    All other components within normal limits  BRAIN NATRIURETIC PEPTIDE - Abnormal; Notable for the following components:   B Natriuretic Peptide 1,615.9 (*)    All other components within normal limits  TROPONIN I (HIGH SENSITIVITY) - Abnormal; Notable for the following components:   Troponin I (High Sensitivity) 31 (*)    All other components within normal limits  TROPONIN I (HIGH SENSITIVITY) - Abnormal; Notable for the following components:   Troponin I (High Sensitivity) 32 (*)    All other components within normal limits    EKG EKG Interpretation  Date/Time:  Sunday November 14 2019 18:40:24 EST Ventricular Rate:  90 PR Interval:  216 QRS Duration: 146 QT Interval:  408 QTC Calculation: 499 R Axis:   -81 Text Interpretation: Sinus rhythm with 1st degree A-V block Left axis deviation Right bundle branch block Abnormal ECG No significant change since last tracing Confirmed by Wandra Arthurs [phone]) on 11/14/2019 7:02:48 PM  Radiology DG Chest Portable 1 View  Result Date: 11/14/2019 CLINICAL DATA:  Shortness of breath. Chills and body aches. COPD.  EXAM: PORTABLE CHEST 1 VIEW COMPARISON:  01/05/2019 FINDINGS: Patient is partially rotated to the right. Scarring is again seen in the right lung base. Mild elevation of left hemidiaphragm is again seen. No evidence of acute infiltrate or pulmonary edema. No evidence of pleural effusion. Old right rib fracture deformity again noted. IMPRESSION: Stable right basilar scarring. No acute findings. Electronically Signed   By: Marlaine Hind M.D.   On: 11/14/2019 20:03    Procedures Procedures (including critical care time)  Medications Ordered in ED Medications  albuterol (VENTOLIN HFA) 108 (90 Base) MCG/ACT inhaler 8 puff (8 puffs Inhalation Given 11/14/19 2000)  furosemide (LASIX) injection 40 mg (40 mg Intravenous Given 11/14/19 2141)  dexamethasone (DECADRON) injection 10 mg (10 mg Intravenous Given 11/14/19 2245)    ED Course  I have reviewed the triage vital signs and the nursing notes.  Pertinent labs & imaging results that were available during my care of the patient were reviewed by me and considered in my medical decision making (see chart for details).  86 year old male presents with worsening shortness of breath.  He also has some other vague symptoms with a minor cough, body aches, diarrhea which is not severe in nature.  Initial temp is 100 here.  He is alert and oriented.  Heart is regular rate and rhythm.  He is mildly tachypneic and lung sounds are globally decreased.  Blood pressures elevated at times.  He is satting at 100% on 4 L.  EKG is sinus rhythm with first-degree block and appears unchanged.  Will obtain labs, chest x-ray, Covid test.  Will give trial of albuterol  CBC is normal.  CMP is overall reassuring.  Chest x-ray shows stable right basilar scarring without any acute findings.  Troponin is minimally elevated to 31 and repeat is 32.  BNP is significantly elevated to 1600 although I have no value to compare this to. Will give dose of Lasix as well.  Covid test is positive.   Shared visit with Dr. Darl Householder.  Patient was ambulated without oxygen and was not hypoxic.  Due to his age, weakness, symptomatic shortness of breath will request admission.  Discussed with Dr. Reviewed with [facility] who feels that patient does not meet criteria for admission with no increased oxygen requirement, negative chest x-ray, and he is able to ambulate.  She recommends trying to set up hospital at home.  Case management was consulted but unfortunately are not available at this time.  Discussed new plan with the patient who is very agreeable and wants to go home. He feels better after albuterol and there may be a component of COPD exacerbation. He was given a dose of decadron here. Will give rx for Prednisone and albuterol for home. He was advised to quarantine until next week and to return if worsening  MDM Rules/Calculators/A&P                       Final Clinical Impression(s) / ED Diagnoses Final diagnoses:  COVID-19  SOB (shortness of breath)    Rx / DC Orders ED Discharge Orders    None       Recardo Evangelist, PA-C 11/14/19 2325    Drenda Freeze, MD 11/17/19 2031

## 2019-11-14 NOTE — Discharge Instructions (Addendum)
Take Prednisone 40mg  daily for 5 days Rest and drink plenty of fluids Please quarantine at home until March 14

## 2019-11-15 ENCOUNTER — Ambulatory Visit (HOSPITAL_COMMUNITY)
Admission: RE | Admit: 2019-11-15 | Discharge: 2019-11-15 | Disposition: A | Discharge status: Home / Self Care | Payer: Medicare Other | Source: Ambulatory Visit | Attending: Pulmonary Disease | Admitting: Pulmonary Disease

## 2019-11-15 ENCOUNTER — Other Ambulatory Visit: Payer: Self-pay | Admitting: Physician Assistant

## 2019-11-15 ENCOUNTER — Telehealth: Payer: Self-pay | Admitting: Surgery

## 2019-11-15 DIAGNOSIS — I259 Chronic ischemic heart disease, unspecified: Secondary | ICD-10-CM | POA: Diagnosis present

## 2019-11-15 DIAGNOSIS — U071 COVID-19: Secondary | ICD-10-CM | POA: Insufficient documentation

## 2019-11-15 DIAGNOSIS — J432 Centrilobular emphysema: Secondary | ICD-10-CM | POA: Diagnosis present

## 2019-11-15 DIAGNOSIS — I10 Essential (primary) hypertension: Secondary | ICD-10-CM

## 2019-11-15 DIAGNOSIS — Z23 Encounter for immunization: Secondary | ICD-10-CM | POA: Insufficient documentation

## 2019-11-15 MED ORDER — METHYLPREDNISOLONE SODIUM SUCC 125 MG IJ SOLR: 125.0000 mg | Freq: Once | INTRAMUSCULAR | Status: DC | PRN

## 2019-11-15 MED ORDER — SODIUM CHLORIDE 0.9 % IV SOLN
INTRAVENOUS | Status: DC | PRN
  Administered 2019-11-15: 250 mL via INTRAVENOUS

## 2019-11-15 MED ORDER — ALBUTEROL SULFATE HFA 108 (90 BASE) MCG/ACT IN AERS: 2.0000 | INHALATION_SPRAY | Freq: Once | RESPIRATORY_TRACT | Status: DC | PRN

## 2019-11-15 MED ORDER — SODIUM CHLORIDE 0.9 % IV SOLN
700.0000 mg | Freq: Once | INTRAVENOUS | Status: AC
  Administered 2019-11-15: 700 mg via INTRAVENOUS
  Filled 2019-11-15: qty 700

## 2019-11-15 MED ORDER — EPINEPHRINE 0.3 MG/0.3ML IJ SOAJ: 0.3000 mg | Freq: Once | INTRAMUSCULAR | Status: DC | PRN

## 2019-11-15 MED ORDER — DIPHENHYDRAMINE HCL 50 MG/ML IJ SOLN: 50.0000 mg | Freq: Once | INTRAMUSCULAR | Status: DC | PRN

## 2019-11-15 MED ORDER — FAMOTIDINE IN NACL 20-0.9 MG/50ML-% IV SOLN: 20.0000 mg | Freq: Once | INTRAVENOUS | Status: DC | PRN

## 2019-11-15 NOTE — Progress Notes (Signed)
  I connected by phone with [name] on 11/15/2019 at 7:58 AM to discuss the potential use of an new treatment for mild to moderate COVID-19 viral infection in non-hospitalized patients.  This patient is a 86 y.o. male that meets the FDA criteria for Emergency Use Authorization of bamlanivimab or casirivimab\imdevimab.  Has a (+) direct SARS-CoV-2 viral test result  Has mild or moderate COVID-19   Is ? 86 years of age and weighs ? 40 kg  Is NOT hospitalized due to COVID-19  Is NOT requiring oxygen therapy or requiring an increase in baseline oxygen flow rate due to COVID-19  Is within 10 days of symptom onset  Has at least one of the high risk factor(s) for progression to severe COVID-19 and/or hospitalization as defined in EUA.  Specific high risk criteria : >/= 84 yo, ischemic heart disease, COPD, HTN   I have spoken and communicated the following to the patient or parent/caregiver:  1. FDA has authorized the emergency use of bamlanivimab and casirivimab\imdevimab for the treatment of mild to moderate COVID-19 in adults and pediatric patients with positive results of direct SARS-CoV-2 viral testing who are 48 years of age and older weighing at least 40 kg, and who are at high risk for progressing to severe COVID-19 and/or hospitalization.  2. The significant known and potential risks and benefits of bamlanivimab and casirivimab\imdevimab, and the extent to which such potential risks and benefits are unknown.  3. Information on available alternative treatments and the risks and benefits of those alternatives, including clinical trials.  4. Patients treated with bamlanivimab and casirivimab\imdevimab should continue to self-isolate and use infection control measures (e.g., wear mask, isolate, social distance, avoid sharing personal items, clean and disinfect "high touch" surfaces, and frequent handwashing) according to CDC guidelines.   5. The patient or parent/caregiver has the  option to accept or refuse bamlanivimab or casirivimab\imdevimab .  After reviewing this information with the patient, The patient agreed to proceed with receiving the bamlanimivab infusion and will be provided a copy of the Fact sheet prior to receiving the infusion.   Sx onset 11/12/19. Has had both vaccines. Recently seen in the ER. Pt set up for mAB infusion today at 12:30. Directions given  Angelena Form 11/15/2019 7:58 AM

## 2019-11-15 NOTE — Progress Notes (Signed)
  Diagnosis: COVID-19  Physician: Asencion Noble  Procedure: [facility] Infusion [facility] Med: bamlanivimab infusion - Provided patient with bamlanimivab fact sheet for patients, parents and caregivers prior to infusion.  Complications: No immediate complications noted.  Discharge: Discharged home   [place], Sibley C 11/15/2019

## 2019-11-15 NOTE — Discharge Instructions (Signed)

## 2019-11-15 NOTE — Telephone Encounter (Signed)
ED CM received message via in-basket CM Pool concerning request for hospital to home Covid-19 Remote health program.  EDP spoke with patient who is agreeable with remote health services. CM called in referral to Lelon Frohlich [phone] who will follow up with patient.

## 2019-11-30 ENCOUNTER — Telehealth: Payer: Self-pay | Admitting: *Deleted

## 2019-11-30 ENCOUNTER — Telehealth: Payer: Self-pay | Admitting: Nurse Practitioner

## 2019-11-30 ENCOUNTER — Other Ambulatory Visit: Payer: Self-pay | Admitting: *Deleted

## 2019-11-30 MED ORDER — AMLODIPINE BESYLATE 5 MG PO TABS: 5.0000 mg | ORAL_TABLET | Freq: Every day | ORAL | 3 refills | Status: DC

## 2019-11-30 MED ORDER — LISINOPRIL 10 MG PO TABS: 10.0000 mg | ORAL_TABLET | Freq: Every day | ORAL | 9 refills | Status: DC

## 2019-11-30 NOTE — Telephone Encounter (Signed)
Will need to start adding back medicines that were stopped with his prior GI bleeding.   Restart Norvasc 5 mg a day   Will restart his Lisinopril but at lower dose - 10 mg a day  Continue to monitor. Call us with update on Monday.   Cecille Rubin

## 2019-11-30 NOTE — Telephone Encounter (Signed)
New Message    Pt c/o BP issue:  1. What are your last 5 BP readings? 196/103, 149/109 2. Are you having any other symptoms (ex. Dizziness, headache, blurred vision, passed out)? No symptoms 3. What is your medication issue?   Beth with [facility] called and they have instructed him to take a whole metroprol and not just a 1/2

## 2019-11-30 NOTE — Telephone Encounter (Signed)
S/w pt, pt's wife and shannon from [facility] with Lori's recommendation's.  Pt will start Norvasc ( 5 mg) daily, pt had plenty of this medication at home.  Pt will start Lisinopril (10 mg) daily, sent in today to requested pharmacy. Pt will monitor bp and call with update on Monday.

## 2019-12-06 ENCOUNTER — Other Ambulatory Visit: Payer: Self-pay | Admitting: *Deleted

## 2019-12-06 DIAGNOSIS — I10 Essential (primary) hypertension: Secondary | ICD-10-CM

## 2019-12-06 DIAGNOSIS — I480 Paroxysmal atrial fibrillation: Secondary | ICD-10-CM

## 2019-12-06 DIAGNOSIS — R0602 Shortness of breath: Secondary | ICD-10-CM

## 2019-12-06 MED ORDER — LISINOPRIL 20 MG PO TABS: 20.0000 mg | ORAL_TABLET | Freq: Every day | ORAL | 3 refills | Status: DC

## 2019-12-06 MED ORDER — METOPROLOL TARTRATE 25 MG PO TABS: 12.5000 mg | ORAL_TABLET | Freq: Two times a day (BID) | ORAL | 0 refills | Status: DC

## 2019-12-06 MED ORDER — HYDROCHLOROTHIAZIDE 25 MG PO TABS: 25.0000 mg | ORAL_TABLET | Freq: Every day | ORAL | 3 refills | Status: DC

## 2019-12-06 MED ORDER — DOCUSATE SODIUM 100 MG PO CAPS: 100.0000 mg | ORAL_CAPSULE | ORAL | 0 refills | Status: DC | PRN

## 2019-12-06 NOTE — Telephone Encounter (Signed)
Let's increase the Lisinopril to 20 mg a day He can stay on the 12.5 of Metoprolol twice a day Let's add HCTZ 25 mg a day  BMET in one week. Will get an update on BP with lab.  Needs to try to elevate legs - I suspect this is from sitting with legs down all day.   Cecille Rubin

## 2019-12-06 NOTE — Addendum Note (Signed)
Addended by: Tamsen Snider on: 12/06/2019 03:19 PM   Modules accepted: Orders

## 2019-12-06 NOTE — Telephone Encounter (Signed)
S/w pt to get bp readings for the week, bp's are as follows:  1.3/24 162/99  HR  66  AM 2  3/24 161/94  HR  63  PM 3. 3/25  156/109  HR  63 4.  3/26  166/84 HR  50 5.  3/27  158/84 HR  60 6.  3/28  144/93 HR  73 7.  3/29  172/104 HR  93  8.  3/29  161/91 HR  76 9.  3/29  155/84 HR  77 10.  3/29  159/86 HR  96  Will send to [place] to advise.

## 2019-12-06 NOTE — Telephone Encounter (Signed)
S/w pt's, pt's spouse,pt's daughter.  Went over all medications pt is taking.  Medication list updated.  Pt's both feet are very swollen.  Asked about pt's diet.  Eats oatmeal or grits for breakfast, apple with peanut butter or tangerine for lunch, uses salt substitute called no salt could not state what pt eats for dinner. Pt does not elevate legs.  Will send to Cecille Rubin to advise.

## 2019-12-06 NOTE — Telephone Encounter (Signed)
S/w daughter and spouse.  Aware of recommendations and agreeable to treatment plan.  Will increase lisinopril one tablet (20 mg) daily, will stay on Metoprolol one tablet (12.5 mg) twice a day.  Will start HCTZ  One tablet ( 25 mg ) daily. All new meds send to requested pharmacy.  Will come in on April 5 and leave bp readings and weights, pt will also get lab work. Appt made orders in and linked.  Pt's weight has increased since March 12 187lb, March 17 193 lb, march 24 196 lb, Today March 29 199lb.  Will mail last AVS to pt's address tomorrow and will send to [facility] to advise about pt's increase weight.

## 2019-12-06 NOTE — Telephone Encounter (Signed)
Let's go to Lisinopril 20 mg a day.  BMET in about 2 weeks and then call with update please.   Cecille Rubin

## 2019-12-06 NOTE — Telephone Encounter (Signed)
I tried to call - no answer this evening.   We are going to need to try and see him this week in the office - ?Wednesday during lunch.   Unclear about his weights - he weighed 202 at his visit with me from January. Did have COVID recently - with weight loss.   For now, would continue with current regimen as previously outlined earlier this afternoon - needs to bring all his bottles and should have someone come with him as well to the visit.   Cecille Rubin

## 2019-12-07 ENCOUNTER — Telehealth: Payer: Self-pay | Admitting: Nurse Practitioner

## 2019-12-07 NOTE — Telephone Encounter (Signed)
I will send to triage for further assessment.

## 2019-12-07 NOTE — Telephone Encounter (Signed)
Pt c/o BP issue: STAT if pt c/o blurred vision, one-sided weakness or slurred speech  1. What are your last 5 BP readings? Today: 204/107 HR 69, 194/92 HR 70, 160/84 HR 58  Yesterday: 165/94 HR 76, 175/91 HR 67  2. Are you having any other symptoms (ex. Dizziness, headache, blurred vision, passed out)? heavy eyelids and tired  3. What is your BP issue? Dawn, pharmacist from [facility], calling stating the patient's BP today has been very high. She states the only symptom he is having is a heaviness in his eyes and feeling tired. She would like the patient to be called back for advice.

## 2019-12-07 NOTE — Telephone Encounter (Signed)
Attempted to call pt no answer and mailbox full unable to leave message .[name]

## 2019-12-07 NOTE — Telephone Encounter (Signed)
S/w pt and pt's spouse is aware will come to office on March 31 @ 12:15 for appt with Truitt Merle, NP.  Pt will bring all medication bottles, weights, and bp readings to appt.

## 2019-12-07 NOTE — Progress Notes (Signed)
CARDIOLOGY OFFICE NOTE  Date:  12/08/2019    [name] Date of Birth: 07-06-1934 Medical Record [id number]  PCP:  [name], MD  Cardiologist:  [name]    Chief Complaint  Patient presents with  . Edema  . Hypertension    Work in visit - seen for Dr. [name]    History of Present Illness: [name] is a 86 y.o. male who presents today for a work in visit. Seen for Dr. [name]. He has seen Dr. [name] in the remote past.Primarily follows with me.  He has a h/o PVC ablation 05/29/10, PAF, obesity, sedentary lifestyle, HLD, gout, OSA treated with CPAP, &dysfunctional diaphragm with chronic dyspneapreviouslyfollowed by Dr. [name]- now with Dr. [name]. He has mild CAD per cath back in August of 2011. Labs are checked by primary care.   Seen back in February of 2016 by Dr. [name] - this was for evaluation of dyspnea. Dr. [name] wanted cardiac status reevaluated to assess for any cardiac issues that may be contributing to dyspnea. Was referred for Myoview and echocardiogram. These studies were satisfactory. Encouraged to cut back on his alcohol intake as well.   I have followed him since - he has been holding his own - dyspnea is chronic.He has gotten back with pulmonary -Dr. [name].Balance issues and has had some falls.Has ended up having documented AF -was placed onanticoagulation - initially with Eliquis - but then transitioned to Coumadin due to costand then back to Eliquisbut was stopped due to GI bleeding - had chronic gastritis on EGD & 3 polyps removed - had required transfusion. Had had lots of epistaxis as well. Really struggling with the pandemic - not able to go to rehab - remains pretty short of breath with all activity - he is now on oxygen and had transitioned to a walker.   I last saw him in mid January - getting PT for his balance - had had 2 falls.   He had COVID earlier this month - was in the ER - did not wish to be  admitted despite having associated CHF. He had had both vaccines. His second one was 3 weeks post the 2nd vaccine.   We have had several recent phone calls - BP sounds like it is up - medicines are not clear - then noted that he was having swelling and weight gain. Very difficult to figure out over the phone - thus asked him to come to the office today for assessment.   The patient does not have symptoms concerning for COVID-19 infection (fever, chills, cough, or new shortness of breath).   Comes in today. Here with his wife. Daughter is on the phone. He says he feels good now. He says he is not short of breath. On oxygen primarily at night. No chest pain. BP up primarily since he has had COVID. He notes his eyes have been hurt - felt like lack of sleep. Not using support stockings. Probably has had too much salt.  He lost weight - down to 187 with COVID - but now it is back up. No palpitations. He feet and lower legs are swollen. He is spending most days in the bed watching TV. Family worried about depression - he typically has to some degree as a general rule. BP still up. Lots of medicines were stopped and cut back a year ago when he had his GI bleed.   Past Medical History:  Diagnosis Date  . Arthritis   .  BPH (benign prostatic hyperplasia)   . COPD (chronic obstructive pulmonary disease) ([place])   . Coronary artery ectasia    Mild CAD with normal systolic function per cath in August of 2011  . Depression   . Diaphragmatic paralysis    felt to be partially responsible for dyspnea  . Dyspnea    due to paralyzed diaphragm and pulmonary issues  . Dysrhythmia   . GERD (gastroesophageal reflux disease)   . Gout   . HTN (hypertension)   . Hypercholesteremia   . Iron deficiency anemia   . Microhematuria   . Obesity   . OSA (obstructive sleep apnea)    CPAP  . Paroxysmal atrial fibrillation (HCC)    occured in the setting of acute E Coli sepsis and ileius 7/12  . PVC (premature ventricular  contraction)    s/p PVC ablation 05/29/2010    Past Surgical History:  Procedure Laterality Date  . APPENDECTOMY  1978  . BIOPSY  02/09/2019   Procedure: BIOPSY;  Surgeon: Clarene Essex, MD;  Location: [facility];  Service: Endoscopy;;  . BIOPSY  03/02/2019   Procedure: BIOPSY;  Surgeon: Clarene Essex, MD;  Location: [facility];  Service: Endoscopy;;  . CARDIAC CATHETERIZATION  04-30-2010   Left main coronary artery is normal.   . CARDIOVASCULAR STRESS TEST  03-19-2010   0%  . CHOLECYSTECTOMY  1990  . COLONOSCOPY N/A 03/02/2019   Procedure: COLONOSCOPY;  Surgeon: Clarene Essex, MD;  Location: [facility];  Service: Endoscopy;  Laterality: N/A;  . COLONOSCOPY W/ POLYPECTOMY    . ESOPHAGOGASTRODUODENOSCOPY (EGD) WITH PROPOFOL N/A 02/09/2019   Procedure: ESOPHAGOGASTRODUODENOSCOPY (EGD) WITH PROPOFOL;  Surgeon: Clarene Essex, MD;  Location: [facility];  Service: Endoscopy;  Laterality: N/A;  . EYE SURGERY Right    catarct  . poly removed from nose as a child    . PVC ablation  05/29/2010  . US ECHOCARDIOGRAPHY  03-09-2010   EF 55-60%     Medications: Current Meds  Medication Sig  . allopurinol (ZYLOPRIM) 300 MG tablet Take 300 mg by mouth daily.    Marland Kitchen aspirin EC 81 MG tablet Take 1 tablet (81 mg total) by mouth daily.  Marland Kitchen buPROPion (WELLBUTRIN SR) 100 MG 12 hr tablet Take 100 mg by mouth 2 (two) times daily.  Marland Kitchen doxazosin (CARDURA) 4 MG tablet Take 2 mg by mouth daily.  . ferrous sulfate 325 (65 FE) MG tablet Take 325 mg by mouth daily with breakfast.  . hydrochlorothiazide (HYDRODIURIL) 25 MG tablet Take 1 tablet (25 mg total) by mouth daily.  Marland Kitchen lisinopril (ZESTRIL) 10 MG tablet Take 20 mg by mouth daily. Two tablets daily  . lovastatin (MEVACOR) 40 MG tablet Take 40 mg by mouth at bedtime.    . metoprolol tartrate (LOPRESSOR) 25 MG tablet Take 1 tablet (25 mg total) by mouth 2 (two) times daily.  . OXYGEN Inhale 2 L/min into the lungs daily as needed (SOB).   . pantoprazole (PROTONIX) 40 MG  tablet Take 40 mg by mouth daily.  . sertraline (ZOLOFT) 100 MG tablet Take 100 mg by mouth daily.  . traZODone (DESYREL) 100 MG tablet Take 100 mg by mouth at bedtime.    Marland Kitchen umeclidinium-vilanterol (ANORO ELLIPTA) 62.5-25 MCG/INH AEPB Inhale 1 puff into the lungs daily.  . vitamin B-12 (CYANOCOBALAMIN) 500 MCG tablet Take 500 mcg by mouth daily.  . [DISCONTINUED] lisinopril (ZESTRIL) 20 MG tablet Take 1 tablet (20 mg total) by mouth daily.  . [DISCONTINUED] metoprolol tartrate (LOPRESSOR) 25  MG tablet Take 0.5 tablets (12.5 mg total) by mouth 2 (two) times daily.     Allergies: No Known Allergies  Social History: The patient  reports that he quit smoking about 35 years ago. His smoking use included cigarettes. He has a 60.00 pack-year smoking history. He has never used smokeless tobacco. He reports current alcohol use of about 7.0 standard drinks of alcohol per week. He reports that he does not use drugs.   Family History: The patient's family history includes Aneurysm (age of onset: 68) in his father; Breast cancer in his daughter and mother.   Review of Systems: Please see the history of present illness.   All other systems are reviewed and negative.   Physical Exam: VS:  BP (!) 160/90 Comment: BP in left arm 160/82  Pulse 74   Ht 5\' 10"  (1.778 m)   Wt 201 lb (91.2 kg)   SpO2 94%   BMI 28.84 kg/m  .  BMI Body mass index is 28.84 kg/m.  Wt Readings from Last 3 Encounters:  12/08/19 201 lb (91.2 kg)  11/14/19 202 lb (91.6 kg)  09/27/19 202 lb (91.6 kg)    General: Alert and in no acute distress. He actually looks good to me today.  HEENT: Normal.  Cardiac: Regular rate and rhythm. Heart tones are distant. 1+ edema.  Respiratory:  Diminished breath sounds.  GI: Soft and nontender.  MS: No deformity or atrophy. Gait and ROM intact. Using his walker.  Skin: Warm and dry. Color is normal.  Neuro:  Strength and sensation are intact and no gross focal deficits noted.  Psych:  Alert, appropriate and with normal affect.   LABORATORY DATA:  EKG:  EKG is not ordered today.    Lab Results  Component Value Date   WBC 5.6 11/14/2019   HGB 13.7 11/14/2019   HCT 42.7 11/14/2019   PLT 126 (L) 11/14/2019   GLUCOSE 126 (H) 11/14/2019   ALT 25 11/14/2019   AST 41 11/14/2019   NA 134 (L) 11/14/2019   K 3.6 11/14/2019   CL 96 (L) 11/14/2019   CREATININE 1.06 11/14/2019   BUN 18 11/14/2019   CO2 25 11/14/2019   TSH 2.640 01/05/2019   INR 1.3 (H) 01/05/2019       BNP (last 3 results) Recent Labs    11/14/19 1850  BNP 1,615.9*    ProBNP (last 3 results) No results for input(s): PROBNP in the last 8760 hours.   Other Studies Reviewed Today:  ECHO IMPRESSIONS 12/2018  1. The left ventricle has normal systolic function with an ejection  fraction of 60-65%. The cavity size was normal. There is mildly increased  left ventricular wall thickness. Left ventricular diastolic function could  not be evaluated.  2. The right ventricle has normal systolic function. The cavity was  normal. There is no increase in right ventricular wall thickness.  3. Left atrial size was not assessed.  4. The aortic valve was not well visualized.  5. The aortic root and ascending aorta are normal in size and structure.  6. The inferior vena cava was dilated in size with <50% respiratory  variability.  7. The interatrial septum was not assessed.    Holter 02/2018 Narrative & Impression    Sinus rhythm with first degree AV block, Right bundle branch block Average heart rate is 62 bpm Occasional premature ventricular contractions Frequent premature atrial contractions with nonsustained atrial tachycardia Nonsustained atrial fibrillation also observed Nocturnal bradycardia with mobitz I second  degree AV block is noted transiently No sustained arrhythmias No prolonged pauses      Myoview Impression from March 2016 Exercise Capacity: [place] with no  exercise. BP Response: Normal blood pressure response. Clinical Symptoms: Nausea. ECG Impression: Atrial fibrillation, no changes from baseline.  Comparison with Prior Nuclear Study: No images to compare  Overall Impression: Low risk stress nuclear study a medium-sized basal inferior and basal to mid inferolateral perfusion defect that is actually worse at rest than with stress. No ischemia. Given normal wall motion, this may represent diaphragmatic attenuation. .  LV Ejection Fraction: 63%. LV Wall Motion: NL LV Function; NL Wall Motion   Loralie Champagne 11/07/2014     ASSESSMENT & PLAN:   1.Recent COVID illness - seems recovered - family worried about possible cardiac effects. More swelling on exam. BP not controlled. Will check lab today. Update his echo.   2. Worsening edema - I suspect this is multifactorial - needs compression stockings. Restrict salt. Now on HCTZ. Updating his echo - I suspect he has some degree of diastolic dysfunction.   3. HTN - not controlled - increasing the Metoprolol to 25 mg BID. Stay on Lisinopril 20 mg and the HCTZ 25 mg. They will continue to check BP at home.   4. PAF - no longer on anticoagulation due to prior GI bleeding - now falling with very poor balance.   5. Prior GI bleed - no plans to restart anticoagulation other than his aspirin.   6. Chronic DOE - see above.   7. PVCs with prior ablation - he denies having any palpitations.   8. Depression - he has a general rule. The pandemic has only made this worse - tried to be encouraging and creative to ways to get more activity.   9. HLD - not discussed  10. CAD - no active chest pain noted.   20. COVID-19 Education: The signs and symptoms of COVID-19 were discussed with the patient and how to seek care for testing (follow up with PCP or arrange E-visit).  The importance of social distancing, staying at home, hand hygiene and wearing a mask when out in public were discussed  today.  Current medicines are reviewed with the patient today.  The patient does not have concerns regarding medicines other than what has been noted above.  The following changes have been made:  See above.  Labs/ tests ordered today include:    Orders Placed This Encounter  Procedures  . Basic metabolic panel  . CBC  . Hepatic function panel  . ECHOCARDIOGRAM COMPLETE     Disposition:   FU with me next week. Lab today. Echo today.     Patient is agreeable to this plan and will call if any problems develop in the interim.   SignedTruitt Merle, NP  12/08/2019 1:10 PM  [facility] [address] [place], [place]  [address] Phone: [phone] Fax: [phone]

## 2019-12-08 ENCOUNTER — Ambulatory Visit: Payer: Medicare Other | Admitting: Nurse Practitioner

## 2019-12-08 ENCOUNTER — Encounter: Payer: Self-pay | Admitting: Nurse Practitioner

## 2019-12-08 ENCOUNTER — Ambulatory Visit (HOSPITAL_COMMUNITY): Payer: Medicare Other | Attending: Cardiovascular Disease

## 2019-12-08 ENCOUNTER — Other Ambulatory Visit: Payer: Self-pay

## 2019-12-08 VITALS — BP 160/90 | HR 74 | Ht 70.0 in | Wt 201.0 lb

## 2019-12-08 DIAGNOSIS — I10 Essential (primary) hypertension: Secondary | ICD-10-CM | POA: Diagnosis not present

## 2019-12-08 DIAGNOSIS — I259 Chronic ischemic heart disease, unspecified: Secondary | ICD-10-CM

## 2019-12-08 DIAGNOSIS — R0602 Shortness of breath: Secondary | ICD-10-CM | POA: Insufficient documentation

## 2019-12-08 DIAGNOSIS — Z7189 Other specified counseling: Secondary | ICD-10-CM

## 2019-12-08 DIAGNOSIS — I480 Paroxysmal atrial fibrillation: Secondary | ICD-10-CM

## 2019-12-08 DIAGNOSIS — U071 COVID-19: Secondary | ICD-10-CM

## 2019-12-08 DIAGNOSIS — Z79899 Other long term (current) drug therapy: Secondary | ICD-10-CM

## 2019-12-08 LAB — ECHOCARDIOGRAM COMPLETE
Height: 70 in
Weight: 3216 oz

## 2019-12-08 MED ORDER — METOPROLOL TARTRATE 25 MG PO TABS: 25.0000 mg | ORAL_TABLET | Freq: Two times a day (BID) | ORAL | 0 refills | Status: DC

## 2019-12-08 NOTE — Telephone Encounter (Signed)
Attempted to return call to this patient. He states that he is almost at our office for his appointment with Truitt Merle, NP. He states that he will wait and discuss his elevated BPs with Cecille Rubin. Made him aware that I would forward the message below with his readings to make her aware.

## 2019-12-08 NOTE — Patient Instructions (Addendum)
After Visit Summary:  We will be checking the following labs today - BMET, CBC, HPF   Medication Instructions:    Continue with your current medicines. BUT  I am increasing the Metoprolol to a whole tablet twice a day.    If you need a refill on your cardiac medications before your next appointment, please call your pharmacy.     Testing/Procedures To Be Arranged:  Echocardiogram  Follow-Up:   See me in about 2 to 3 weeks    At [facility], you and your health needs are our priority.  As part of our continuing mission to provide you with exceptional heart care, we have created designated Provider Care Teams.  These Care Teams include your primary Cardiologist (physician) and Advanced Practice Providers (APPs -  Physician Assistants and Nurse Practitioners) who all work together to provide you with the care you need, when you need it.  Special Instructions:  . Stay safe, stay home, wash your hands for at least 20 seconds and wear a mask when out in public.  . It was good to talk with you today.  . Knee high support stockings . Restrict the salt . Walking your route twice a day - then try to go to 3 times.  Marland Kitchen Keep a check on your blood pressure.    Call the [place] office at [phone] if you have any questions, problems or concerns.

## 2019-12-08 NOTE — Telephone Encounter (Signed)
Patient returning call.

## 2019-12-08 NOTE — Progress Notes (Signed)
CARDIOLOGY OFFICE NOTE  Date:  12/15/2019    [name] Date of Birth: [birth year] Medical Record [id number]   PCP:  [name], MD  Cardiologist:  [name]  Chief Complaint  Patient presents with  . Follow-up    Seen for Dr. [name]    History of Present Illness: [name] is a 86 y.o. male who presents today for a follow up visit. Seen for Dr. [name]. He has seen Dr. [name] in the remote past.Primarily follows with me.  He has a h/o PVC ablation 05/29/10, PAF, obesity, sedentary lifestyle, HLD, gout, OSA treated with CPAP, &dysfunctional diaphragm with chronic dyspneapreviouslyfollowed by Dr. [name]- now with Dr. [name]. He has mild CAD per cath back in August of 2011. Labs are checked by primary care.   Seen back in February of 2016 by Dr. [name] - this was for evaluation of dyspnea. Dr. [name] wanted cardiac status reevaluated to assess for any cardiac issues that may be contributing to dyspnea. Was referred for Myoview and echocardiogram. These studies were satisfactory. Encouraged to cut back on his alcohol intake as well.   I have followed him since - he has been holding his own - dyspnea is chronic.He has gotten back with pulmonary -Dr. [name].Balance issues and has had some falls.Has ended up having documented AF -was placed onanticoagulation - initially with Eliquis - but then transitioned to Coumadin due to costand then back to Eliquisbut was stopped due to GI bleeding - had chronic gastritis on EGD & 3 polyps removed - had required transfusion. Had had lots of epistaxis as well. Really struggling with the pandemic - not able to go to rehab - remains pretty short of breath with all activity - he is now on oxygen and had transitioned to a walker.  I last saw him in mid January - getting PT for his balance - had had 2 falls.   He had COVID in March - was in the ER - did not wish to be admitted despite having associated CHF. He  had had both vaccines.   Then had several recent phone calls - BP was up - medicines are not clear - then noted that he was having swelling and weight gain. Very difficult to figure out over the phone - thus asked him to come to the office today for assessment. Seen last week - actually doing ok except for his BP. No palpitations. Spending too much time in bed. I increased his beta blocker. Ended up adding Norvasc as well.   The patient does not have symptoms concerning for COVID-19 infection (fever, chills, cough, or new shortness of breath).   Comes in today. Here with his wife. He is doing much better. Has gotten out - sat outside. BP is now much lower - almost too low at times - fortunately, no symptomatic. No palpitations. Swelling is resolved. Using less salt. Wearing his support stockings. His echo was reassuring - he notes that that procedure was pretty hard for him to tolerate due to shortness of breath. Wife had her visit with Dr. Philip Aspen - he mentioned increasing [name]'s Wellbutrin but she has not picked this up yet.   Past Medical History:  Diagnosis Date  . Arthritis   . BPH (benign prostatic hyperplasia)   . COPD (chronic obstructive pulmonary disease) ([place])   . Coronary artery ectasia    Mild CAD with normal systolic function per cath in August of 2011  . Depression   .  Diaphragmatic paralysis    felt to be partially responsible for dyspnea  . Dyspnea    due to paralyzed diaphragm and pulmonary issues  . Dysrhythmia   . GERD (gastroesophageal reflux disease)   . Gout   . HTN (hypertension)   . Hypercholesteremia   . Iron deficiency anemia   . Microhematuria   . Obesity   . OSA (obstructive sleep apnea)    CPAP  . Paroxysmal atrial fibrillation (HCC)    occured in the setting of acute E Coli sepsis and ileius 7/12  . PVC (premature ventricular contraction)    s/p PVC ablation 05/29/2010    Past Surgical History:  Procedure Laterality Date  . APPENDECTOMY  1978  .  BIOPSY  02/09/2019   Procedure: BIOPSY;  Surgeon: Clarene Essex, MD;  Location: [facility];  Service: Endoscopy;;  . BIOPSY  03/02/2019   Procedure: BIOPSY;  Surgeon: Clarene Essex, MD;  Location: [facility];  Service: Endoscopy;;  . CARDIAC CATHETERIZATION  04-30-2010   Left main coronary artery is normal.   . CARDIOVASCULAR STRESS TEST  03-19-2010   0%  . CHOLECYSTECTOMY  1990  . COLONOSCOPY N/A 03/02/2019   Procedure: COLONOSCOPY;  Surgeon: Clarene Essex, MD;  Location: [facility];  Service: Endoscopy;  Laterality: N/A;  . COLONOSCOPY W/ POLYPECTOMY    . ESOPHAGOGASTRODUODENOSCOPY (EGD) WITH PROPOFOL N/A 02/09/2019   Procedure: ESOPHAGOGASTRODUODENOSCOPY (EGD) WITH PROPOFOL;  Surgeon: Clarene Essex, MD;  Location: [facility];  Service: Endoscopy;  Laterality: N/A;  . EYE SURGERY Right    catarct  . poly removed from nose as a child    . PVC ablation  05/29/2010  . US ECHOCARDIOGRAPHY  03-09-2010   EF 55-60%     Medications: Current Meds  Medication Sig  . allopurinol (ZYLOPRIM) 300 MG tablet Take 300 mg by mouth daily.    Marland Kitchen amLODipine (NORVASC) 5 MG tablet Take 0.5 tablets (2.5 mg total) by mouth daily.  Marland Kitchen aspirin EC 81 MG tablet Take 1 tablet (81 mg total) by mouth daily.  Marland Kitchen buPROPion (WELLBUTRIN SR) 100 MG 12 hr tablet Take 100 mg by mouth 2 (two) times daily.  Marland Kitchen doxazosin (CARDURA) 4 MG tablet Take 2 mg by mouth daily.  . ferrous sulfate 325 (65 FE) MG tablet Take 325 mg by mouth daily with breakfast.  . hydrochlorothiazide (HYDRODIURIL) 25 MG tablet Take 1 tablet (25 mg total) by mouth daily.  Marland Kitchen lisinopril (ZESTRIL) 10 MG tablet Take 20 mg by mouth daily. Two tablets daily  . lovastatin (MEVACOR) 40 MG tablet Take 40 mg by mouth at bedtime.    . metoprolol tartrate (LOPRESSOR) 25 MG tablet Take 1 tablet (25 mg total) by mouth 2 (two) times daily.  . OXYGEN Inhale 2 L/min into the lungs daily as needed (SOB).   . pantoprazole (PROTONIX) 40 MG tablet Take 40 mg by mouth daily.  .  sertraline (ZOLOFT) 100 MG tablet Take 100 mg by mouth daily.  . traZODone (DESYREL) 100 MG tablet Take 100 mg by mouth at bedtime.    Marland Kitchen umeclidinium-vilanterol (ANORO ELLIPTA) 62.5-25 MCG/INH AEPB Inhale 1 puff into the lungs daily.  . vitamin B-12 (CYANOCOBALAMIN) 500 MCG tablet Take 500 mcg by mouth daily.  . [DISCONTINUED] amLODipine (NORVASC) 5 MG tablet Take 1 tablet (5 mg total) by mouth daily.     Allergies: No Known Allergies  Social History: The patient  reports that he quit smoking about 35 years ago. His smoking use included cigarettes. He has a  60.00 pack-year smoking history. He has never used smokeless tobacco. He reports current alcohol use of about 7.0 standard drinks of alcohol per week. He reports that he does not use drugs.   Family History: The patient's family history includes Aneurysm (age of onset: 55) in his father; Breast cancer in his daughter and mother.   Review of Systems: Please see the history of present illness.   All other systems are reviewed and negative.   Physical Exam: VS:  BP 128/80   Pulse 67   Ht 5\' 10"  (1.778 m)   Wt 201 lb 12.8 oz (91.5 kg)   SpO2 92%   BMI 28.96 kg/m  .  BMI Body mass index is 28.96 kg/m.  Wt Readings from Last 3 Encounters:  12/15/19 201 lb 12.8 oz (91.5 kg)  12/08/19 201 lb (91.2 kg)  11/14/19 202 lb (91.6 kg)    General: Pleasant. Alert and in no acute distress.  He has had some sun.  Cardiac: Regular rate and rhythm. Less ectopics today. No edema. Has his support stockings on.  Respiratory:  Lungs are clear to auscultation bilaterally with normal work of breathing.  GI: Soft and nontender.  MS: No deformity or atrophy. Gait and ROM intact. He is using a walker today.  Skin: Warm and dry. Color is normal.  Neuro:  Strength and sensation are intact and no gross focal deficits noted.  Psych: Alert, appropriate and with normal affect.   LABORATORY DATA:  EKG:  EKG is not ordered today.   Lab Results    Component Value Date   WBC 5.5 12/08/2019   HGB 13.1 12/08/2019   HCT 38.6 12/08/2019   PLT 187 12/08/2019   GLUCOSE 85 12/08/2019   ALT 13 12/08/2019   AST 22 12/08/2019   NA 140 12/08/2019   K 3.6 12/08/2019   CL 102 12/08/2019   CREATININE 0.98 12/08/2019   BUN 17 12/08/2019   CO2 24 12/08/2019   TSH 2.640 01/05/2019   INR 1.3 (H) 01/05/2019     BNP (last 3 results) Recent Labs    11/14/19 1850  BNP 1,615.9*    ProBNP (last 3 results) No results for input(s): PROBNP in the last 8760 hours.   Other Studies Reviewed Today:  ECHO IMPRESSIONS 11/2019  1. Incessant ectopy makes wall motion analysis challenging. Cannot  confidently exclude basal inferolateral hypokinesis, but uspect  PVC-related artifact. Other wall segments contract normally. Left  ventricular ejection fraction, by estimation, is 55 to  60%. The left ventricle has normal function. The left ventricle has no  regional wall motion abnormalities. There is moderate concentric left  ventricular hypertrophy. Left ventricular diastolic function could not be  evaluated.  2. Right ventricular systolic function is normal. The right ventricular  size is normal. The estimated right ventricular systolic pressure is 123XX123  mmHg.  3. Left atrial size was severely dilated.  4. The mitral valve is normal in structure. Mild mitral valve  regurgitation.  5. The aortic valve is tricuspid. Aortic valve regurgitation is trivial.  Mild to moderate aortic valve sclerosis/calcification is present, without  any evidence of aortic stenosis.  6. The inferior vena cava is dilated in size with >50% respiratory  variability, suggesting right atrial pressure of 8 mmHg.    Holter 02/2018 Narrative & Impression    Sinus rhythm with first degree AV block, Right bundle branch block Average heart rate is 62 bpm Occasional premature ventricular contractions Frequent premature atrial contractions with nonsustained atrial  tachycardia Nonsustained atrial fibrillation also observed Nocturnal bradycardia with mobitz I second degree AV block is noted transiently No sustained arrhythmias No prolonged pauses      Myoview Impression from March 2016 Exercise Capacity: Lexiscan with no exercise. BP Response: Normal blood pressure response. Clinical Symptoms: Nausea. ECG Impression: Atrial fibrillation, no changes from baseline.  Comparison with Prior Nuclear Study: No images to compare  Overall Impression: Low risk stress nuclear study a medium-sized basal inferior and basal to mid inferolateral perfusion defect that is actually worse at rest than with stress. No ischemia. Given normal wall motion, this may represent diaphragmatic attenuation. .  LV Ejection Fraction: 63%. LV Wall Motion: NL LV Function; NL Wall Motion   Loralie Champagne 11/07/2014     ASSESSMENT & PLAN:   1. HTN  - had had lots of medicines stopped when he had his GI bleed - have now had to have several added back - now with some lower readings. Cutting Norvasc in half. I suspect his salt restriction has helped as well.   2. Prior COVID illness - this seems resolved. Echo was stable from this standpoint.   3. Edema - basically gone. BMET to be repeated today.   4. PAF - no longer on anticoagulation due to prior GI bleed - also now falling with very poor balance - fortunately, in sinus by exam today.   5. Prior GI bleed - has not recurred  6. Chronic DOE - stable. Echo reviewed with them  7. PVCs - has had prior ablation - now back on beta blocker   8. Depression - he has had this as a general rule - seems better today - ok with me to use more Wellbutrin if they would like.   9. HLD   10. CAD - no active chest pain - would favor conservative management.   56. COVID-19 Education: The signs and symptoms of COVID-19 were discussed with the patient and how to seek care for testing (follow up with PCP or arrange  E-visit).  The importance of social distancing, staying at home, hand hygiene and wearing a mask when out in public were discussed today.  Current medicines are reviewed with the patient today.  The patient does not have concerns regarding medicines other than what has been noted above.  The following changes have been made:  See above.  Labs/ tests ordered today include:   No orders of the defined types were placed in this encounter.    Disposition:   FU with me in next month as planned.   Patient is agreeable to this plan and will call if any problems develop in the interim.   SignedTruitt Merle, NP  12/15/2019 2:47 PM  [facility] [address] [place], [place]  [address] Phone: [phone] Fax: [phone]

## 2019-12-09 ENCOUNTER — Telehealth: Payer: Self-pay | Admitting: Nurse Practitioner

## 2019-12-09 ENCOUNTER — Telehealth: Payer: Self-pay | Admitting: *Deleted

## 2019-12-09 LAB — BASIC METABOLIC PANEL
BUN/Creatinine Ratio: 17 (ref 10–24)
BUN: 17 mg/dL (ref 8–27)
CO2: 24 mmol/L (ref 20–29)
Calcium: 9.1 mg/dL (ref 8.6–10.2)
Chloride: 102 mmol/L (ref 96–106)
Creatinine, Ser: 0.98 mg/dL (ref 0.76–1.27)
GFR calc Af Amer: 82 mL/min/{1.73_m2} (ref >59)
GFR calc non Af Amer: 71 mL/min/{1.73_m2} (ref >59)
Glucose: 85 mg/dL (ref 65–99)
Potassium: 3.6 mmol/L (ref 3.5–5.2)
Sodium: 140 mmol/L (ref 134–144)

## 2019-12-09 LAB — HEPATIC FUNCTION PANEL
ALT: 13 IU/L (ref 0–44)
AST: 22 IU/L (ref 0–40)
Albumin: 4.1 g/dL (ref 3.6–4.6)
Alkaline Phosphatase: 54 IU/L (ref 39–117)
Bilirubin Total: 0.6 mg/dL (ref 0.0–1.2)
Bilirubin, Direct: 0.23 mg/dL (ref 0.00–0.40)
Total Protein: 5.8 g/dL — ABNORMAL LOW (ref 6.0–8.5)

## 2019-12-09 LAB — CBC
Hematocrit: 38.6 % (ref 37.5–51.0)
Hemoglobin: 13.1 g/dL (ref 13.0–17.7)
MCH: 33.2 pg — ABNORMAL HIGH (ref 26.6–33.0)
MCHC: 33.9 g/dL (ref 31.5–35.7)
MCV: 98 fL — ABNORMAL HIGH (ref 79–97)
Platelets: 187 10*3/uL (ref 150–450)
RBC: 3.95 x10E6/uL — ABNORMAL LOW (ref 4.14–5.80)
RDW: 15 % (ref 11.6–15.4)
WBC: 5.5 10*3/uL (ref 3.4–10.8)

## 2019-12-09 MED ORDER — AMLODIPINE BESYLATE 5 MG PO TABS: 5.0000 mg | ORAL_TABLET | Freq: Every day | ORAL | 3 refills | Status: DC

## 2019-12-09 NOTE — Telephone Encounter (Signed)
Pt calling in after resulted echo.  Pt reported this am after medications in left arm bp 181/108  HR 68  O2 90 wt 196.6 lb. In right arm 175/105  HR  61.  Advised with Cecille Rubin pt will start back Norvasc one tablet (5mg ) daily, will send in to requested pharmacy # [id number].

## 2019-12-09 NOTE — Telephone Encounter (Signed)
New Message ° ° ° ° °Pt is returning call for results  ° ° ° ° °

## 2019-12-13 ENCOUNTER — Other Ambulatory Visit: Payer: Medicare Other

## 2019-12-15 ENCOUNTER — Ambulatory Visit (INDEPENDENT_AMBULATORY_CARE_PROVIDER_SITE_OTHER): Payer: Medicare Other | Admitting: Nurse Practitioner

## 2019-12-15 ENCOUNTER — Other Ambulatory Visit: Payer: Self-pay

## 2019-12-15 ENCOUNTER — Encounter: Payer: Self-pay | Admitting: Nurse Practitioner

## 2019-12-15 VITALS — BP 128/80 | HR 67 | Ht 70.0 in | Wt 201.8 lb

## 2019-12-15 DIAGNOSIS — Z79899 Other long term (current) drug therapy: Secondary | ICD-10-CM

## 2019-12-15 DIAGNOSIS — I480 Paroxysmal atrial fibrillation: Secondary | ICD-10-CM | POA: Diagnosis not present

## 2019-12-15 DIAGNOSIS — I259 Chronic ischemic heart disease, unspecified: Secondary | ICD-10-CM | POA: Diagnosis not present

## 2019-12-15 DIAGNOSIS — U071 COVID-19: Secondary | ICD-10-CM

## 2019-12-15 DIAGNOSIS — I10 Essential (primary) hypertension: Secondary | ICD-10-CM

## 2019-12-15 DIAGNOSIS — R0602 Shortness of breath: Secondary | ICD-10-CM

## 2019-12-15 MED ORDER — AMLODIPINE BESYLATE 5 MG PO TABS: 2.5000 mg | ORAL_TABLET | Freq: Every day | ORAL | 3 refills | Status: DC

## 2019-12-15 NOTE — Patient Instructions (Addendum)
After Visit Summary:  We will be checking the following labs today - BMET   Medication Instructions:    Continue with your current medicines. BUT  I am cutting the Norvasc (Amlodipine) in half.    If you need a refill on your cardiac medications before your next appointment, please call your pharmacy.     Testing/Procedures To Be Arranged:  N/A  Follow-Up:   See me as planned next month.     At [facility], you and your health needs are our priority.  As part of our continuing mission to provide you with exceptional heart care, we have created designated Provider Care Teams.  These Care Teams include your primary Cardiologist (physician) and Advanced Practice Providers (APPs -  Physician Assistants and Nurse Practitioners) who all work together to provide you with the care you need, when you need it.  Special Instructions:  . Stay safe, stay home, wash your hands for at least 20 seconds and wear a mask when out in public.  . It was good to talk with you both today.  Marland Kitchen Keep a check on your BP for me - hopefully your BP will all even out with this reduction in your medicine   Call the [place] office at [phone] if you have any questions, problems or concerns.

## 2019-12-16 LAB — BASIC METABOLIC PANEL
BUN/Creatinine Ratio: 32 — ABNORMAL HIGH (ref 10–24)
BUN: 37 mg/dL — ABNORMAL HIGH (ref 8–27)
CO2: 23 mmol/L (ref 20–29)
Calcium: 8.4 mg/dL — ABNORMAL LOW (ref 8.6–10.2)
Chloride: 104 mmol/L (ref 96–106)
Creatinine, Ser: 1.16 mg/dL (ref 0.76–1.27)
GFR calc Af Amer: 67 mL/min/{1.73_m2} (ref >59)
GFR calc non Af Amer: 58 mL/min/{1.73_m2} — ABNORMAL LOW (ref >59)
Glucose: 88 mg/dL (ref 65–99)
Potassium: 3.4 mmol/L — ABNORMAL LOW (ref 3.5–5.2)
Sodium: 141 mmol/L (ref 134–144)

## 2020-01-12 NOTE — Progress Notes (Signed)
CARDIOLOGY OFFICE NOTE  Date:  01/19/2020    [name] Date of Birth: Jun 12, 1934 Medical Record [id number]  PCP:  [name], MD  Cardiologist:  [name]  Chief Complaint  Patient presents with  . Follow-up    Seen for Dr. [name]    History of Present Illness: [name] is a 86 y.o. male who presents today for a one month check. Seen for Dr. [name]. He has seen Dr. [name] in the remote past.Primarily follows with me.  He has a h/o PVC ablation 05/29/10, PAF, obesity, sedentary lifestyle, HLD, gout, OSA treated with CPAP, &dysfunctional diaphragm with chronic dyspneapreviouslyfollowed by Dr. [name]- now with Dr. [name]. He has mild CAD per cath back in August of 2011. Labs are checked by primary care.   Seen back in February of 2016 by Dr. [name] - this was for evaluation of dyspnea. Dr. [name] wanted cardiac status reevaluated to assess for any cardiac issues that may be contributing to dyspnea. Was referred for Myoview and echocardiogram. These studies were satisfactory. Encouraged to cut back on his alcohol intake as well.   I have followed him since - he has been holding his own - dyspnea is chronic.He has gotten back with pulmonary -Dr. [name].Balance issues and has had some falls.Has ended up having documented AF -was placed onanticoagulation - initially with Eliquis - but then transitioned to Coumadin due to costand then back to Eliquisbut was stopped due to GI bleeding - had chronic gastritis on EGD & 3 polyps removed - had required transfusion. Had had lots of epistaxis as well. Really struggling with the pandemic - not able to go to rehab -remains pretty short of breath with all activity - heis nowon oxygen and had transitioned to a walker.He has had some falls.   He had COVID in March - was in the ER - did not wish to be admitted despite having associated CHF.He had had both vaccines.   Then had several recent phone  calls - BP was up - medicines are not clear - then noted that he was having swelling and weight gain. Very difficult to figure out over the phone - thus asked him to come to the office today for assessment.BP still up. Spending way too much time in bed. I increased his beta blocker. Ended up adding Norvasc as well. Last seen a month ago - was doing better - getting out of bed - less salt - BP had actually gotten low - we cut medicines back. Echo was reassuring.   The patient does not have symptoms concerning for COVID-19 infection (fever, chills, cough, or new shortness of breath).   Comes in today. Here alone. He looks better. Looks stronger. He had some home health therapy with what sounds like PT - this has been beneficial. List of BP's show that after medicines, BP is in the 80 to [id number] systolic. He has not been dizzy. He is unsteady which is chronic. No falls. Not staying in the bed. He is trying to do more. No chest pain. Breathing seems stable. Overall, looks better.   Past Medical History:  Diagnosis Date  . Arthritis   . BPH (benign prostatic hyperplasia)   . COPD (chronic obstructive pulmonary disease) ([place])   . Coronary artery ectasia    Mild CAD with normal systolic function per cath in August of 2011  . Depression   . Diaphragmatic paralysis    felt to be partially responsible for dyspnea  .  Dyspnea    due to paralyzed diaphragm and pulmonary issues  . Dysrhythmia   . GERD (gastroesophageal reflux disease)   . Gout   . HTN (hypertension)   . Hypercholesteremia   . Iron deficiency anemia   . Microhematuria   . Obesity   . OSA (obstructive sleep apnea)    CPAP  . Paroxysmal atrial fibrillation (H[facility])    occured in the setting of acute E Coli sepsis and ileius 7/12  . PVC (premature ventricular contraction)    s/p PVC ablation 05/29/2010    Past Surgical History:  Procedure Laterality Date  . APPENDECTOMY  1978  . BIOPSY  02/09/2019   Procedure: BIOPSY;  Surgeon: Clarene Essex, MD;  Location: [facility];  Service: Endoscopy;;  . BIOPSY  03/02/2019   Procedure: BIOPSY;  Surgeon: Clarene Essex, MD;  Location: [facility];  Service: Endoscopy;;  . CARDIAC CATHETERIZATION  04-30-2010   Left main coronary artery is normal.   . CARDIOVASCULAR STRESS TEST  03-19-2010   0%  . CHOLECYSTECTOMY  1990  . COLONOSCOPY N/A 03/02/2019   Procedure: COLONOSCOPY;  Surgeon: Clarene Essex, MD;  Location: [facility];  Service: Endoscopy;  Laterality: N/A;  . COLONOSCOPY W/ POLYPECTOMY    . ESOPHAGOGASTRODUODENOSCOPY (EGD) WITH PROPOFOL N/A 02/09/2019   Procedure: ESOPHAGOGASTRODUODENOSCOPY (EGD) WITH PROPOFOL;  Surgeon: Clarene Essex, MD;  Location: [facility];  Service: Endoscopy;  Laterality: N/A;  . EYE SURGERY Right    catarct  . poly removed from nose as a child    . PVC ablation  05/29/2010  . US ECHOCARDIOGRAPHY  03-09-2010   EF 55-60%     Medications: Current Meds  Medication Sig  . allopurinol (ZYLOPRIM) 300 MG tablet Take 300 mg by mouth daily.    Marland Kitchen aspirin EC 81 MG tablet Take 1 tablet (81 mg total) by mouth daily.  Marland Kitchen buPROPion (WELLBUTRIN SR) 100 MG 12 hr tablet Take 100 mg by mouth 2 (two) times daily.  Marland Kitchen doxazosin (CARDURA) 4 MG tablet Take 2 mg by mouth daily.  . ferrous sulfate 325 (65 FE) MG tablet Take 325 mg by mouth daily with breakfast.  . hydrochlorothiazide (HYDRODIURIL) 25 MG tablet Take 1 tablet (25 mg total) by mouth daily.  Marland Kitchen lisinopril (ZESTRIL) 10 MG tablet Take 20 mg by mouth daily. Two tablets daily  . lovastatin (MEVACOR) 40 MG tablet Take 40 mg by mouth at bedtime.    . OXYGEN Inhale 2 L/min into the lungs daily as needed (SOB).   . pantoprazole (PROTONIX) 40 MG tablet Take 40 mg by mouth daily.  . sertraline (ZOLOFT) 100 MG tablet Take 100 mg by mouth daily.  . traZODone (DESYREL) 100 MG tablet Take 100 mg by mouth at bedtime.    Marland Kitchen umeclidinium-vilanterol (ANORO ELLIPTA) 62.5-25 MCG/INH AEPB Inhale 1 puff into the lungs daily.  . vitamin B-12  (CYANOCOBALAMIN) 500 MCG tablet Take 500 mcg by mouth daily.  . [DISCONTINUED] amLODipine (NORVASC) 5 MG tablet Take 0.5 tablets (2.5 mg total) by mouth daily.     Allergies: No Known Allergies  Social History: The patient  reports that he quit smoking about 35 years ago. His smoking use included cigarettes. He has a 60.00 pack-year smoking history. He has never used smokeless tobacco. He reports current alcohol use of about 7.0 standard drinks of alcohol per week. He reports that he does not use drugs.   Family History: The patient's family history includes Aneurysm (age of onset: 52) in his father; Breast  cancer in his daughter and mother.   Review of Systems: Please see the history of present illness.   All other systems are reviewed and negative.   Physical Exam: VS:  BP 122/60   Pulse 89   Ht 5\' 10"  (1.778 m)   Wt 200 lb 6.4 oz (90.9 kg)   SpO2 97%   BMI 28.75 kg/m  .  BMI Body mass index is 28.75 kg/m.  Wt Readings from Last 3 Encounters:  01/19/20 200 lb 6.4 oz (90.9 kg)  12/15/19 201 lb 12.8 oz (91.5 kg)  12/08/19 201 lb (91.2 kg)    General: Pleasant. He looks better today. More upbeat as well.    HEENT: Normal.  Neck: Supple, no JVD, carotid bruits, or masses noted.  Cardiac: Regular rate and rhythm. Heart tones are distant. No edema today.  Respiratory:  Lungs are clear to auscultation bilaterally with normal work of breathing.  GI: Soft and nontender.  MS: No deformity or atrophy. Gait and ROM intact. Using a walker.  Skin: Warm and dry. Color is normal.  Neuro:  Strength and sensation are intact and no gross focal deficits noted.  Psych: Alert, appropriate and with normal affect.   LABORATORY DATA:  EKG:  EKG is not ordered today.    Lab Results  Component Value Date   WBC 5.5 12/08/2019   HGB 13.1 12/08/2019   HCT 38.6 12/08/2019   PLT 187 12/08/2019   GLUCOSE 88 12/15/2019   ALT 13 12/08/2019   AST 22 12/08/2019   NA 141 12/15/2019   K 3.4 (L)  12/15/2019   CL 104 12/15/2019   CREATININE 1.16 12/15/2019   BUN 37 (H) 12/15/2019   CO2 23 12/15/2019   TSH 2.640 01/05/2019   INR 1.3 (H) 01/05/2019         BNP (last 3 results) Recent Labs    11/14/19 1850  BNP 1,615.9*    ProBNP (last 3 results) No results for input(s): PROBNP in the last 8760 hours.   Other Studies Reviewed Today:  ECHO IMPRESSIONS 11/2019  1. Incessant ectopy makes wall motion analysis challenging. Cannot  confidently exclude basal inferolateral hypokinesis, but uspect  PVC-related artifact. Other wall segments contract normally. Left  ventricular ejection fraction, by estimation, is 55 to  60%. The left ventricle has normal function. The left ventricle has no  regional wall motion abnormalities. There is moderate concentric left  ventricular hypertrophy. Left ventricular diastolic function could not be  evaluated.  2. Right ventricular systolic function is normal. The right ventricular  size is normal. The estimated right ventricular systolic pressure is 123XX123  mmHg.  3. Left atrial size was severely dilated.  4. The mitral valve is normal in structure. Mild mitral valve  regurgitation.  5. The aortic valve is tricuspid. Aortic valve regurgitation is trivial.  Mild to moderate aortic valve sclerosis/calcification is present, without  any evidence of aortic stenosis.  6. The inferior vena cava is dilated in size with >50% respiratory  variability, suggesting right atrial pressure of 8 mmHg.    Holter 02/2018 Narrative & Impression    Sinus rhythm with first degree AV block, Right bundle branch block Average heart rate is 62 bpm Occasional premature ventricular contractions Frequent premature atrial contractions with nonsustained atrial tachycardia Nonsustained atrial fibrillation also observed Nocturnal bradycardia with mobitz I second degree AV block is noted transiently No sustained arrhythmias No prolonged pauses       Myoview Impression from March 2016 Exercise Capacity: Carlton Adam  with no exercise. BP Response: Normal blood pressure response. Clinical Symptoms: Nausea. ECG Impression: Atrial fibrillation, no changes from baseline.  Comparison with Prior Nuclear Study: No images to compare  Overall Impression: Low risk stress nuclear study a medium-sized basal inferior and basal to mid inferolateral perfusion defect that is actually worse at rest than with stress. No ischemia. Given normal wall motion, this may represent diaphragmatic attenuation. .  LV Ejection Fraction: 63%. LV Wall Motion: NL LV Function; NL Wall Motion   Loralie Champagne 11/07/2014     ASSESSMENT & PLAN:   1. HTN - will stop the low dose Norvasc - I do not want his BP's this low after medicines. He will continue to monitor. We have had somewhat labile control - I suspect some of that is salt related. Seems better overall.   2. Prior COVID illness - seems resolved. This happened about a week after 2nd vaccine.   3. Edema - this is resolved. Salt restriction has helped. Recheck BMET today.   4. Hypokalemia - mild - recheck today.   5. PAF - no longer on anticoagulation due to prior GI bleed - has had several past falls as well and balance is poor - poor candidate for anticoagulation going forward.   6. Prior GI bleed - this has not recurred.   7. Chronic DOE - looks stable - his echo was stable.   8. PVCs - with prior ablation - now back on beta blocker.   9. Depression - seems ok today.   10. HLD - on statin. Labs from earlier this year noted.   11. CAD - no active chest pain - would favor conservative management.   12. COVID-19 Education: The signs and symptoms of COVID-19 were discussed with the patient and how to seek care for testing (follow up with PCP or arrange E-visit).  The importance of social distancing, staying at home, hand hygiene and wearing a mask when out in public were discussed  today.  Current medicines are reviewed with the patient today.  The patient does not have concerns regarding medicines other than what has been noted above.  The following changes have been made:  See above.  Labs/ tests ordered today include:    Orders Placed This Encounter  Procedures  . Basic metabolic panel     Disposition:   FU with me in 3 months.   Patient is agreeable to this plan and will call if any problems develop in the interim.   SignedTruitt Merle, NP  01/19/2020 10:48 AM  [facility] [address] [place], [place]  [address] Phone: [phone] Fax: [phone]

## 2020-01-19 ENCOUNTER — Other Ambulatory Visit: Payer: Self-pay

## 2020-01-19 ENCOUNTER — Encounter: Payer: Self-pay | Admitting: Nurse Practitioner

## 2020-01-19 ENCOUNTER — Ambulatory Visit: Payer: Medicare Other | Admitting: Nurse Practitioner

## 2020-01-19 VITALS — BP 122/60 | HR 89 | Ht 70.0 in | Wt 200.4 lb

## 2020-01-19 DIAGNOSIS — I10 Essential (primary) hypertension: Secondary | ICD-10-CM

## 2020-01-19 DIAGNOSIS — U071 COVID-19: Secondary | ICD-10-CM

## 2020-01-19 DIAGNOSIS — R0602 Shortness of breath: Secondary | ICD-10-CM

## 2020-01-19 DIAGNOSIS — I493 Ventricular premature depolarization: Secondary | ICD-10-CM

## 2020-01-19 DIAGNOSIS — I259 Chronic ischemic heart disease, unspecified: Secondary | ICD-10-CM

## 2020-01-19 DIAGNOSIS — Z79899 Other long term (current) drug therapy: Secondary | ICD-10-CM

## 2020-01-19 DIAGNOSIS — I480 Paroxysmal atrial fibrillation: Secondary | ICD-10-CM

## 2020-01-19 LAB — BASIC METABOLIC PANEL
BUN/Creatinine Ratio: 36 — ABNORMAL HIGH (ref 10–24)
BUN: 40 mg/dL — ABNORMAL HIGH (ref 8–27)
CO2: 26 mmol/L (ref 20–29)
Calcium: 9.3 mg/dL (ref 8.6–10.2)
Chloride: 105 mmol/L (ref 96–106)
Creatinine, Ser: 1.11 mg/dL (ref 0.76–1.27)
GFR calc Af Amer: 70 mL/min/{1.73_m2} (ref >59)
GFR calc non Af Amer: 61 mL/min/{1.73_m2} (ref >59)
Glucose: 106 mg/dL — ABNORMAL HIGH (ref 65–99)
Potassium: 3.7 mmol/L (ref 3.5–5.2)
Sodium: 144 mmol/L (ref 134–144)

## 2020-01-19 NOTE — Patient Instructions (Addendum)
After Visit Summary:  We will be checking the following labs today - BMET   Medication Instructions:    Continue with your current medicines. BUT  I am stopping Norvasc today.    If you need a refill on your cardiac medications before your next appointment, please call your pharmacy.     Testing/Procedures To Be Arranged:  N/A  Follow-Up:   See me in about 3 months.     At [facility], you and your health needs are our priority.  As part of our continuing mission to provide you with exceptional heart care, we have created designated Provider Care Teams.  These Care Teams include your primary Cardiologist (physician) and Advanced Practice Providers (APPs -  Physician Assistants and Nurse Practitioners) who all work together to provide you with the care you need, when you need it.  Special Instructions:  . Stay safe, stay home, wash your hands for at least 20 seconds and wear a mask when out in public.  . It was good to talk with you today.  . Continue to monitor your BP for me.    Call the [place] office at [phone] if you have any questions, problems or concerns.

## 2020-02-11 ENCOUNTER — Other Ambulatory Visit: Payer: Self-pay | Admitting: Nurse Practitioner

## 2020-02-14 ENCOUNTER — Other Ambulatory Visit: Payer: Self-pay | Admitting: Pulmonary Disease

## 2020-03-15 ENCOUNTER — Other Ambulatory Visit: Payer: Self-pay | Admitting: Nurse Practitioner

## 2020-04-05 NOTE — Progress Notes (Signed)
CARDIOLOGY OFFICE NOTE  Date:  04/19/2020    [name] Date of Birth: October 08, 1933 Medical Record #[id number]  PCP:  [name], MD  Cardiologist:  [name]   Chief Complaint  Patient presents with  . Follow-up    Seen for Dr. [name]    History of Present Illness: [name] is a 86 y.o. male who presents today for a 3 month check. Seen for Dr. [name]. He has seen Dr. [name] in the remote past.Primarily follows with me.  He has a h/o PVC ablation 05/29/10, PAF, obesity, sedentary lifestyle, HLD, gout, OSA treated with CPAP, &dysfunctional diaphragm with chronic dyspneapreviouslyfollowed by Dr. [name]- now with Dr. [name]. He has mild CAD per cath back in August of 2011. Labs are checked by primary care.   Seen back in February of 2016 by Dr. [name] - this was for evaluation of dyspnea. Dr. [name] wanted cardiac status reevaluated to assess for any cardiac issues that may be contributing to dyspnea. Was referred for Myoview and echocardiogram. These studies were satisfactory. Encouraged to cut back on his alcohol intake as well.   I have followed him since - he has been holding his own - dyspnea is chronic.He has gotten back with pulmonary -Dr. [name].Balance issues and has had some falls.Has ended up having documented AF -was placed onanticoagulation - initially with Eliquis - but then transitioned to Coumadin due to costand then back to Eliquisbut was stopped due to GI bleeding - had chronic gastritis on EGD & 3 polyps removed - had required transfusion. Had had lots of epistaxis as well. Really struggling with the pandemic - not able to go to rehab -remains pretty short of breath with all activity - heis nowon oxygen and had transitioned to a walker.He has had some falls.   He had COVIDin March- was in the ER - did not wish to be admitted despite having associated CHF.He had had both vaccines.   Then hadseveral recent phone  calls - BP wasup - medicines are not clear - then noted that he was having swelling and weight gain. Very difficult to figure out over the phone - thus asked him to come to the office today for assessment.BP still up. Spending way too much time in bed. I increased his beta blocker. Ended up adding Norvasc as well.Improved with salt restriction, increasing activity - to the point that we were able to cut medicines back again.  Echo was reassuring.   Last seen in May - he was doing well - getting home PT. Chronically unsteady. No falls. Trying to do more.   Comes in today. Here alone today. Wife is out of town. BP pretty much ok in the morning - notes it is typically much lower in the afternoons - he can "feel" it - his neck starts hurting - but not dizzy. We stopped Norvasc at last visit. He thought he was off his Lisinopril - this has not been stopped. He is taking his Cardura twice a day - thought I told him to do this - I did not. He thinks this is a 4 mg tablet but not sure. Seems to really not know his medicines again.  Wife comes back tomorrow. Seeing Dr. Philip Aspen tomorrow. Breathing is stable for him - remains on oxygen and CPAP. Doing PT twice a week.  He has had a fall in his house - saw a bug and turned to squash it and fell due to losing his  balance. Fortunately did not get hurt. His rhythm seems ok. No chest pain. Overall seems to be holding his own.   Past Medical History:  Diagnosis Date  . Arthritis   . BPH (benign prostatic hyperplasia)   . COPD (chronic obstructive pulmonary disease) ([place])   . Coronary artery ectasia    Mild CAD with normal systolic function per cath in August of 2011  . Depression   . Diaphragmatic paralysis    felt to be partially responsible for dyspnea  . Dyspnea    due to paralyzed diaphragm and pulmonary issues  . Dysrhythmia   . GERD (gastroesophageal reflux disease)   . Gout   . HTN (hypertension)   . Hypercholesteremia   . Iron deficiency anemia     . Microhematuria   . Obesity   . OSA (obstructive sleep apnea)    CPAP  . Paroxysmal atrial fibrillation (HCC)    occured in the setting of acute E Coli sepsis and ileius 7/12  . PVC (premature ventricular contraction)    s/p PVC ablation 05/29/2010    Past Surgical History:  Procedure Laterality Date  . APPENDECTOMY  1978  . BIOPSY  02/09/2019   Procedure: BIOPSY;  Surgeon: Clarene Essex, MD;  Location: [facility];  Service: Endoscopy;;  . BIOPSY  03/02/2019   Procedure: BIOPSY;  Surgeon: Clarene Essex, MD;  Location: [facility];  Service: Endoscopy;;  . CARDIAC CATHETERIZATION  04-30-2010   Left main coronary artery is normal.   . CARDIOVASCULAR STRESS TEST  03-19-2010   0%  . CHOLECYSTECTOMY  1990  . COLONOSCOPY N/A 03/02/2019   Procedure: COLONOSCOPY;  Surgeon: Clarene Essex, MD;  Location: [facility];  Service: Endoscopy;  Laterality: N/A;  . COLONOSCOPY W/ POLYPECTOMY    . ESOPHAGOGASTRODUODENOSCOPY (EGD) WITH PROPOFOL N/A 02/09/2019   Procedure: ESOPHAGOGASTRODUODENOSCOPY (EGD) WITH PROPOFOL;  Surgeon: Clarene Essex, MD;  Location: [facility];  Service: Endoscopy;  Laterality: N/A;  . EYE SURGERY Right    catarct  . poly removed from nose as a child    . PVC ablation  05/29/2010  . US ECHOCARDIOGRAPHY  03-09-2010   EF 55-60%     Medications: Current Meds  Medication Sig  . allopurinol (ZYLOPRIM) 300 MG tablet Take 300 mg by mouth daily.    [name] 62.5-25 MCG/INH AEPB INHALE 1 PUFF EVERY DAY  . aspirin EC 81 MG tablet Take 1 tablet (81 mg total) by mouth daily.  Marland Kitchen buPROPion (WELLBUTRIN SR) 100 MG 12 hr tablet Take 100 mg by mouth 2 (two) times daily.  . ferrous sulfate 325 (65 FE) MG tablet Take 325 mg by mouth daily with breakfast.  . hydrochlorothiazide (HYDRODIURIL) 25 MG tablet TAKE 1 TABLET BY MOUTH EVERY DAY  . lisinopril (ZESTRIL) 10 MG tablet Take 10 mg by mouth daily.  Marland Kitchen lovastatin (MEVACOR) 40 MG tablet Take 40 mg by mouth at bedtime.    . metoprolol  tartrate (LOPRESSOR) 25 MG tablet Take 1 tablet (25 mg total) by mouth 2 (two) times daily.  . OXYGEN Inhale 2 L/min into the lungs daily as needed (SOB).   . pantoprazole (PROTONIX) 40 MG tablet Take 40 mg by mouth daily.  . sertraline (ZOLOFT) 100 MG tablet Take 100 mg by mouth daily.  . traZODone (DESYREL) 100 MG tablet Take 100 mg by mouth at bedtime.    . vitamin B-12 (CYANOCOBALAMIN) 500 MCG tablet Take 500 mcg by mouth daily.  . [DISCONTINUED] doxazosin (CARDURA) 2 MG tablet Take 2  mg by mouth at bedtime.  . [DISCONTINUED] lisinopril (ZESTRIL) 10 MG tablet Take 20 mg by mouth daily. Two tablets daily     Allergies: No Known Allergies  Social History: The patient  reports that he quit smoking about 35 years ago. His smoking use included cigarettes. He has a 60.00 pack-year smoking history. He has never used smokeless tobacco. He reports current alcohol use of about 7.0 standard drinks of alcohol per week. He reports that he does not use drugs.   Family History: The patient's family history includes Aneurysm (age of onset: 43) in his father; Breast cancer in his daughter and mother.   Review of Systems: Please see the history of present illness.   All other systems are reviewed and negative.   Physical Exam: VS:  BP (!) 120/58 (BP Location: Left Arm, Patient Position: Sitting, Cuff Size: Normal)   Pulse 62   Ht 5\' 10"  (1.778 m)   Wt 197 lb (89.4 kg)   SpO2 97% Comment: at rest  BMI 28.27 kg/m  .  BMI Body mass index is 28.27 kg/m.  Wt Readings from Last 3 Encounters:  04/19/20 197 lb (89.4 kg)  01/19/20 200 lb 6.4 oz (90.9 kg)  12/15/19 201 lb 12.8 oz (91.5 kg)    General: Pleasant. Alert and in no acute distress.  Chronically ill appearing. Using a walker.  Cardiac: Heart tones are very distant. No edema.  Respiratory:  Lungs with decreased breath sounds but with normal work of breathing at rest.  GI: Soft and nontender.  MS: No deformity or atrophy. Gait and ROM  intact. Has his walker.   Skin: Warm and dry. Color is normal.  Neuro:  Strength and sensation are intact and no gross focal deficits noted.  Psych: Alert, appropriate and with normal affect.   LABORATORY DATA:  EKG:  EKG is not ordered today.   Lab Results  Component Value Date   WBC 5.5 12/08/2019   HGB 13.1 12/08/2019   HCT 38.6 12/08/2019   PLT 187 12/08/2019   GLUCOSE 106 (H) 01/19/2020   ALT 13 12/08/2019   AST 22 12/08/2019   NA 144 01/19/2020   K 3.7 01/19/2020   CL 105 01/19/2020   CREATININE 1.11 01/19/2020   BUN 40 (H) 01/19/2020   CO2 26 01/19/2020   TSH 2.640 01/05/2019   INR 1.3 (H) 01/05/2019       BNP (last 3 results) Recent Labs    11/14/19 1850  BNP 1,615.9*    ProBNP (last 3 results) No results for input(s): PROBNP in the last 8760 hours.   Other Studies Reviewed Today:  ECHOIMPRESSIONS3/2021  1. Incessant ectopy makes wall motion analysis challenging. Cannot  confidently exclude basal inferolateral hypokinesis, but uspect  PVC-related artifact. Other wall segments contract normally. Left  ventricular ejection fraction, by estimation, is 55 to  60%. The left ventricle has normal function. The left ventricle has no  regional wall motion abnormalities. There is moderate concentric left  ventricular hypertrophy. Left ventricular diastolic function could not be  evaluated.  2. Right ventricular systolic function is normal. The right ventricular  size is normal. The estimated right ventricular systolic pressure is 59.5  mmHg.  3. Left atrial size was severely dilated.  4. The mitral valve is normal in structure. Mild mitral valve  regurgitation.  5. The aortic valve is tricuspid. Aortic valve regurgitation is trivial.  Mild to moderate aortic valve sclerosis/calcification is present, without  any evidence of aortic stenosis.  6. The inferior vena cava is dilated in size with >50% respiratory  variability, suggesting right atrial  pressure of 8 mmHg.    Holter 02/2018 Narrative & Impression    Sinus rhythm with first degree AV block, Right bundle branch block Average heart rate is 62 bpm Occasional premature ventricular contractions Frequent premature atrial contractions with nonsustained atrial tachycardia Nonsustained atrial fibrillation also observed Nocturnal bradycardia with mobitz I second degree AV block is noted transiently No sustained arrhythmias No prolonged pauses      Myoview Impression from March 2016 Exercise Capacity: Lexiscan with no exercise. BP Response: Normal blood pressure response. Clinical Symptoms: Nausea. ECG Impression: Atrial fibrillation, no changes from baseline.  Comparison with Prior Nuclear Study: No images to compare  Overall Impression: Low risk stress nuclear study a medium-sized basal inferior and basal to mid inferolateral perfusion defect that is actually worse at rest than with stress. No ischemia. Given normal wall motion, this may represent diaphragmatic attenuation. .  LV Ejection Fraction: 63%. LV Wall Motion: NL LV Function; NL Wall Motion   Loralie Champagne 11/07/2014     ASSESSMENT & PLAN:   1. HTN - no longer on Norvasc. He is not sure of his medicines - unclear how much oversight family is doing. I would not want him on Cardura BID - changing this to just 2 mg at bedtime. Cutting Lisinopril back to 10 mg once a day. Lab today. Needs to go by this med list. Bring bottles to next OV.   2. PAF - no longer on anticoagulation due to prior GI bleed - balance is poor. He is a poor candidate for anticoagulation going forward.   3. Chronic DOE - seems stable.   4. PVCs - prior ablation - on beta blocker.   5. Depression - this is chronic  6. Probable med non compliance - trying to get this sorted out.   7. HLD - on statin therapy.   8. CAD - no active chest pain - would favor conservative management going forward.   Current  medicines are reviewed with the patient today.  The patient does not have concerns regarding medicines other than what has been noted above.  The following changes have been made:  See above.  Labs/ tests ordered today include:    Orders Placed This Encounter  Procedures  . Basic metabolic panel  . CBC     Disposition:   FU with me in about 2 months. Lab today. Med compliance is key going forward.     Patient is agreeable to this plan and will call if any problems develop in the interim.   SignedTruitt Merle, NP  04/19/2020 10:52 AM  [facility] [address] [place], [place]  [address] Phone: [phone] Fax: [phone]

## 2020-04-19 ENCOUNTER — Encounter: Payer: Self-pay | Admitting: Nurse Practitioner

## 2020-04-19 ENCOUNTER — Ambulatory Visit: Payer: Medicare Other | Admitting: Nurse Practitioner

## 2020-04-19 ENCOUNTER — Other Ambulatory Visit: Payer: Self-pay

## 2020-04-19 VITALS — BP 120/58 | HR 62 | Ht 70.0 in | Wt 197.0 lb

## 2020-04-19 DIAGNOSIS — I480 Paroxysmal atrial fibrillation: Secondary | ICD-10-CM

## 2020-04-19 DIAGNOSIS — R0602 Shortness of breath: Secondary | ICD-10-CM

## 2020-04-19 DIAGNOSIS — I10 Essential (primary) hypertension: Secondary | ICD-10-CM

## 2020-04-19 LAB — CBC
Hematocrit: 39.6 % (ref 37.5–51.0)
Hemoglobin: 13.2 g/dL (ref 13.0–17.7)
MCH: 32.5 pg (ref 26.6–33.0)
MCHC: 33.3 g/dL (ref 31.5–35.7)
MCV: 98 fL — ABNORMAL HIGH (ref 79–97)
Platelets: 148 10*3/uL — ABNORMAL LOW (ref 150–450)
RBC: 4.06 x10E6/uL — ABNORMAL LOW (ref 4.14–5.80)
RDW: 13.5 % (ref 11.6–15.4)
WBC: 6.3 10*3/uL (ref 3.4–10.8)

## 2020-04-19 LAB — BASIC METABOLIC PANEL
BUN/Creatinine Ratio: 30 — ABNORMAL HIGH (ref 10–24)
BUN: 40 mg/dL — ABNORMAL HIGH (ref 8–27)
CO2: 26 mmol/L (ref 20–29)
Calcium: 9.4 mg/dL (ref 8.6–10.2)
Chloride: 100 mmol/L (ref 96–106)
Creatinine, Ser: 1.34 mg/dL — ABNORMAL HIGH (ref 0.76–1.27)
GFR calc Af Amer: 56 mL/min/{1.73_m2} — ABNORMAL LOW (ref >59)
GFR calc non Af Amer: 48 mL/min/{1.73_m2} — ABNORMAL LOW (ref >59)
Glucose: 92 mg/dL (ref 65–99)
Potassium: 3.6 mmol/L (ref 3.5–5.2)
Sodium: 139 mmol/L (ref 134–144)

## 2020-04-19 MED ORDER — DOXAZOSIN MESYLATE 4 MG PO TABS: 2.0000 mg | ORAL_TABLET | Freq: Every day | ORAL | Status: AC

## 2020-04-19 NOTE — Patient Instructions (Addendum)
After Visit Summary:  We will be checking the following labs today - BMET & CBC   Medication Instructions:    Continue with your current medicines. BUT  GO by this list today - the Lisinopril is just going to be 10 mg once a day - in the morning. The Doxazosin is just going to be just 1/2 a tablet (2mg ) at bedtime.   I want you to bring all your medicines with you to your next visit and also to Dr. Shon Baton.    If you need a refill on your cardiac medications before your next appointment, please call your pharmacy.     Testing/Procedures To Be Arranged:  N/A  Follow-Up:   See me in 2 months    At [facility], you and your health needs are our priority.  As part of our continuing mission to provide you with exceptional heart care, we have created designated Provider Care Teams.  These Care Teams include your primary Cardiologist (physician) and Advanced Practice Providers (APPs -  Physician Assistants and Nurse Practitioners) who all work together to provide you with the care you need, when you need it.  Special Instructions:  . Stay safe, wash your hands for at least 20 seconds and wear a mask when needed.  . It was good to talk with you today.    Call the [place] office at [phone] if you have any questions, problems or concerns.

## 2020-04-20 ENCOUNTER — Telehealth: Payer: Self-pay | Admitting: *Deleted

## 2020-04-20 NOTE — Telephone Encounter (Signed)
ERROR

## 2020-05-07 ENCOUNTER — Other Ambulatory Visit: Payer: Self-pay | Admitting: Nurse Practitioner

## 2020-06-11 NOTE — Progress Notes (Signed)
Virtual Visit via Telephone Note   This visit type was conducted due to national recommendations for restrictions regarding the COVID-19 Pandemic (e.g. social distancing) in an effort to limit this patient's exposure and mitigate transmission in our community.  Due to his co-morbid illnesses, this patient is at least at moderate risk for complications without adequate follow up.  This format is felt to be most appropriate for this patient at this time.  All issues noted in this document were discussed and addressed.  A limited physical exam was performed with this format.  Please refer to the patient's chart for his consent to telehealth for [facility].   Evaluation Performed:  Follow-up visit  This visit type was conducted due to national recommendations for restrictions regarding the COVID-19 Pandemic (e.g. social distancing).  This format is felt to be most appropriate for this patient at this time.  All issues noted in this document were discussed and addressed.  No physical exam was performed (except for noted visual exam findings with Video Visits).  Please refer to the patient's chart (MyChart message for video visits and phone note for telephone visits) for the patient's consent to telehealth for [facility].    Date:  06/19/2020    [name] Date of Birth: [birth year] Medical Record #[id number]   Patient Location:  Home  Provider location:   [facility]   PCP:  [name], MD  Cardiologist:  [name]  Chief Complaint  Patient presents with  . Follow-up    Seen for Dr. [name]    History of Present Illness: [name] is a 86 y.o. male who presents today for a 2 month check. Seen for Dr. [name]. He has seen Dr. [name] in the remote past.Primarily follows with me.  He has a h/o PVC ablation 05/29/10, PAF, obesity, sedentary lifestyle, HLD, gout, OSA treated with CPAP, &dysfunctional diaphragm with chronic  dyspneapreviouslyfollowed by Dr. [name]- now with Dr. [name]. He has mild CAD per cath back in August of 2011. Labs are checked by primary care.   Seen back in February of 2016 by Dr. [name] - this was for evaluation of dyspnea. Dr. [name] wanted cardiac status reevaluated to assess for any cardiac issues that may be contributing to dyspnea. Was referred for Myoview and echocardiogram. These studies were satisfactory. Encouraged to cut back on his alcohol intake as well.   I have followed him since - he has been holding his own - dyspnea is chronic.He has gotten back with pulmonary -Dr. [name].Balance issues and has had some falls.Has ended up having documented AF -was placed onanticoagulation - initially with Eliquis - but then transitioned to Coumadin due to costand then back to Eliquisbut was stopped due to GI bleeding - had chronic gastritis on EGD & 3 polyps removed - had required transfusion. Had had lots of epistaxis as well. Really struggling with the pandemic - not able to go to rehab -remains pretty short of breath with all activity - heis nowon oxygen and had transitioned to a walker.He has had some falls.  He had COVIDback in Marchdespite being vaccinated - was in the ER - did not wish to be admitted despite having associated CHF.  Then hadseveral phone calls - BP wasup - medicines were not clear - then noted that he was having swelling and weight gain. Very difficult to figure out over the phone - thus asked him to come to the office today for assessment.BP still up.Spendingwaytoo much  time in bed. I increased his beta blocker. Ended up adding Norvasc as well.Improved with salt restriction, increasing activity - to the point that we were able to cut medicines back again.  Echo was reassuring. Last seen in August - medicines seemed a little unclear again. Wife was out of town. Breathing was stable. He had had a fall - no injury.   Seen today by telephone call  - he notes more difficulty with ambulating and difficulty with getting into our office - he opted for virtual visit today instead. He is getting therapy twice a week - he does not think it has really helped. His balance remains poor. No falls fortunately. BP is doing well. He is having trouble with using less salt - notes the other agents just "don't taste good". Weight is fairly stable. HR of 48 - he is not dizzy at all - ?PVCs not picked up. He says "You would be happy with how I am doing".   Past Medical History:  Diagnosis Date  . Arthritis   . BPH (benign prostatic hyperplasia)   . COPD (chronic obstructive pulmonary disease) ([place])   . Coronary artery ectasia    Mild CAD with normal systolic function per cath in August of 2011  . Depression   . Diaphragmatic paralysis    felt to be partially responsible for dyspnea  . Dyspnea    due to paralyzed diaphragm and pulmonary issues  . Dysrhythmia   . GERD (gastroesophageal reflux disease)   . Gout   . HTN (hypertension)   . Hypercholesteremia   . Iron deficiency anemia   . Microhematuria   . Obesity   . OSA (obstructive sleep apnea)    CPAP  . Paroxysmal atrial fibrillation (HCC)    occured in the setting of acute E Coli sepsis and ileius 7/12  . PVC (premature ventricular contraction)    s/p PVC ablation 05/29/2010    Past Surgical History:  Procedure Laterality Date  . APPENDECTOMY  1978  . BIOPSY  02/09/2019   Procedure: BIOPSY;  Surgeon: Clarene Essex, MD;  Location: [facility];  Service: Endoscopy;;  . BIOPSY  03/02/2019   Procedure: BIOPSY;  Surgeon: Clarene Essex, MD;  Location: [facility];  Service: Endoscopy;;  . CARDIAC CATHETERIZATION  04-30-2010   Left main coronary artery is normal.   . CARDIOVASCULAR STRESS TEST  03-19-2010   0%  . CHOLECYSTECTOMY  1990  . COLONOSCOPY N/A 03/02/2019   Procedure: COLONOSCOPY;  Surgeon: Clarene Essex, MD;  [facility]: [facility];  Service: Endoscopy;  Laterality: N/A;  . COLONOSCOPY W/  POLYPECTOMY    . ESOPHAGOGASTRODUODENOSCOPY (EGD) WITH PROPOFOL N/A 02/09/2019   Procedure: ESOPHAGOGASTRODUODENOSCOPY (EGD) WITH PROPOFOL;  Surgeon: Clarene Essex, MD;  [facility];  Service: Endoscopy;  Laterality: N/A;  . EYE SURGERY Right    catarct  . poly removed from nose as a child    . PVC ablation  05/29/2010  . US ECHOCARDIOGRAPHY  03-09-2010   EF 55-60%     Medications: Current Meds  Medication Sig  . allopurinol (ZYLOPRIM) 300 MG tablet Take 300 mg by mouth daily.    [name] 62.5-25 MCG/INH AEPB INHALE 1 PUFF BY MOUTH EVERY DAY  . aspirin EC 81 MG tablet Take 1 tablet (81 mg total) by mouth daily.  Marland Kitchen buPROPion (WELLBUTRIN SR) 100 MG 12 hr tablet Take 100 mg by mouth 2 (two) times daily.  Marland Kitchen doxazosin (CARDURA) 4 MG tablet Take 0.5 tablets (2 mg total)  by mouth at bedtime.  . ferrous sulfate 325 (65 FE) MG tablet Take 325 mg by mouth daily with breakfast.  . hydrochlorothiazide (HYDRODIURIL) 25 MG tablet TAKE 1 TABLET BY MOUTH EVERY DAY  . lovastatin (MEVACOR) 40 MG tablet Take 40 mg by mouth at bedtime.    . metoprolol tartrate (LOPRESSOR) 25 MG tablet TAKE 1 TABLET BY MOUTH TWICE A DAY  . OXYGEN Inhale 2 L/min into the lungs daily as needed (SOB).   . pantoprazole (PROTONIX) 40 MG tablet Take 40 mg by mouth daily.  . sertraline (ZOLOFT) 100 MG tablet Take 100 mg by mouth daily.  . traZODone (DESYREL) 100 MG tablet Take 100 mg by mouth at bedtime.    . vitamin B-12 (CYANOCOBALAMIN) 500 MCG tablet Take 500 mcg by mouth daily.     Allergies: No Known Allergies  Social History: The patient  reports that he quit smoking about 35 years ago. His smoking use included cigarettes. He has a 60.00 pack-year smoking history. He has never used smokeless tobacco. He reports current alcohol use of about 7.0 standard drinks of alcohol per week. He reports that he does not use drugs.   Family History: The patient's family history includes Aneurysm (age of onset: 24) in  his father; Breast cancer in his daughter and mother.   Review of Systems: Please see the history of present illness.   All other systems are reviewed and negative.   Physical Exam: VS:  BP 131/83   Pulse (!) 48   Ht 5\' 10"  (1.778 m)   Wt 199 lb (90.3 kg)   SpO2 96%   BMI 28.55 kg/m  .  BMI Body mass index is 28.55 kg/m.  Wt Readings from Last 3 Encounters:  06/19/20 199 lb (90.3 kg)  04/19/20 197 lb (89.4 kg)  01/19/20 200 lb 6.4 oz (90.9 kg)    OBJECTIVE:  He sounds good - seems upbeat today. Not short of breath with conversation.   LABORATORY DATA:  EKG:  EKG is not ordered today.    Lab Results  Component Value Date   WBC 6.3 04/19/2020   HGB 13.2 04/19/2020   HCT 39.6 04/19/2020   PLT 148 (L) 04/19/2020   GLUCOSE 92 04/19/2020   ALT 13 12/08/2019   AST 22 12/08/2019   NA 139 04/19/2020   K 3.6 04/19/2020   CL 100 04/19/2020   CREATININE 1.34 (H) 04/19/2020   BUN 40 (H) 04/19/2020   CO2 26 04/19/2020   TSH 2.640 01/05/2019   INR 1.3 (H) 01/05/2019     BNP (last 3 results) Recent Labs    11/14/19 1850  BNP 1,615.9*    ProBNP (last 3 results) No results for input(s): PROBNP in the last 8760 hours.   Other Studies Reviewed Today:  ECHOIMPRESSIONS3/2021  1. Incessant ectopy makes wall motion analysis challenging. Cannot  confidently exclude basal inferolateral hypokinesis, but uspect  PVC-related artifact. Other wall segments contract normally. Left  ventricular ejection fraction, by estimation, is 55 to  60%. The left ventricle has normal function. The left ventricle has no  regional wall motion abnormalities. There is moderate concentric left  ventricular hypertrophy. Left ventricular diastolic function could not be  evaluated.  2. Right ventricular systolic function is normal. The right ventricular  size is normal. The estimated right ventricular systolic pressure is 44.8  mmHg.  3. Left atrial size was severely dilated.  4. The  mitral valve is normal in structure. Mild mitral valve  regurgitation.  5. The aortic valve is tricuspid. Aortic valve regurgitation is trivial.  Mild to moderate aortic valve sclerosis/calcification is present, without  any evidence of aortic stenosis.  6. The inferior vena cava is dilated in size with >50% respiratory  variability, suggesting right atrial pressure of 8 mmHg.    Holter 02/2018 Narrative & Impression    Sinus rhythm with first degree AV block, Right bundle branch block Average heart rate is 62 bpm Occasional premature ventricular contractions Frequent premature atrial contractions with nonsustained atrial tachycardia Nonsustained atrial fibrillation also observed Nocturnal bradycardia with mobitz I second degree AV block is noted transiently No sustained arrhythmias No prolonged pauses      Myoview Impression from March 2016 Exercise Capacity: Lexiscan with no exercise. BP Response: Normal blood pressure response. Clinical Symptoms: Nausea. ECG Impression: Atrial fibrillation, no changes from baseline.  Comparison with Prior Nuclear Study: No images to compare  Overall Impression: Low risk stress nuclear study a medium-sized basal inferior and basal to mid inferolateral perfusion defect that is actually worse at rest than with stress. No ischemia. Given normal wall motion, this may represent diaphragmatic attenuation. .  LV Ejection Fraction: 63%. LV Wall Motion: NL LV Function; NL Wall Motion   Loralie Champagne 11/07/2014     ASSESSMENT & PLAN:   1. HTN - BP looks good - hopefully his medicines are correct - I always worry a bit about this. He sounds good overall.   2. PAF - no longer a candidate due to prior GI bleed/poor balance.  3. Chronic DOE - multifactorial - seems stable.   4. Bradycardia - ?PVC's - will check EKG on return - he is not symptomatic.   5. Depression - this is chronic.   6. HLD - on statin  7. CAD -  managed conservatively.    Patient Risk:   After full review of this patient's clinical status, I feel that they are at least moderate risk at this time.  Time:   Today, I have spent 8 minutes with the patient with telehealth technology discussing the above issues.     Medication Adjustments/Labs and Tests Ordered: Current medicines are reviewed at length with the patient today.  Concerns regarding medicines are outlined above.   Tests Ordered: No orders of the defined types were placed in this encounter.   Medication Changes: No orders of the defined types were placed in this encounter.  The following changes have been made:  See above.  Labs/ tests ordered today include:   No orders of the defined types were placed in this encounter.    Disposition:   FU with me in the office in 2 months with EKG - we can assist with transfer into the office if needed.    Patient is agreeable to this plan and will call if any problems develop in the interim.   SignedTruitt Merle, NP  06/19/2020 10:27 AM  [place] [address] [place], [place]  [address] Phone: [phone] Fax: [phone]

## 2020-06-17 ENCOUNTER — Other Ambulatory Visit: Payer: Self-pay | Admitting: Pulmonary Disease

## 2020-06-19 ENCOUNTER — Encounter: Payer: Self-pay | Admitting: Nurse Practitioner

## 2020-06-19 ENCOUNTER — Telehealth (INDEPENDENT_AMBULATORY_CARE_PROVIDER_SITE_OTHER): Payer: Medicare Other | Admitting: Nurse Practitioner

## 2020-06-19 ENCOUNTER — Other Ambulatory Visit: Payer: Self-pay

## 2020-06-19 VITALS — BP 131/83 | HR 48 | Ht 70.0 in | Wt 199.0 lb

## 2020-06-19 DIAGNOSIS — Z79899 Other long term (current) drug therapy: Secondary | ICD-10-CM

## 2020-06-19 DIAGNOSIS — I259 Chronic ischemic heart disease, unspecified: Secondary | ICD-10-CM

## 2020-06-19 DIAGNOSIS — I493 Ventricular premature depolarization: Secondary | ICD-10-CM

## 2020-06-19 DIAGNOSIS — I480 Paroxysmal atrial fibrillation: Secondary | ICD-10-CM

## 2020-06-19 DIAGNOSIS — R0602 Shortness of breath: Secondary | ICD-10-CM

## 2020-06-19 DIAGNOSIS — I10 Essential (primary) hypertension: Secondary | ICD-10-CM

## 2020-06-19 NOTE — Patient Instructions (Signed)
After Visit Summary:  We will be checking the following labs today - NONE   Medication Instructions:    Continue with your current medicines.    If you need a refill on your cardiac medications before your next appointment, please call your pharmacy.     Testing/Procedures To Be Arranged:  N/A  Follow-Up:   See me in about 2 months with EKG    At [facility], you and your health needs are our priority.  As part of our continuing mission to provide you with exceptional heart care, we have created designated Provider Care Teams.  These Care Teams include your primary Cardiologist (physician) and Advanced Practice Providers (APPs -  Physician Assistants and Nurse Practitioners) who all work together to provide you with the care you need, when you need it.  Special Instructions:  . Stay safe, wash your hands for at least 20 seconds and wear a mask when needed.  . It was good to talk with you today.    Call the [place] office at [phone] if you have any questions, problems or concerns.

## 2020-07-19 ENCOUNTER — Other Ambulatory Visit: Payer: Self-pay | Admitting: Pulmonary Disease

## 2020-08-02 NOTE — Progress Notes (Deleted)
CARDIOLOGY OFFICE NOTE  Date:  08/02/2020    [name] Date of Birth: 09-Sep-1934 Medical Record #[id number]  PCP:  [name], MD  Cardiologist:  [name]   No chief complaint on file.   History of Present Illness: [name] is a 86 y.o. male who presents today for a follow up visit.  Seen for Dr. [name]. He has seen Dr. [name] in the remote past.Primarily follows with me.  He has a h/o PVC ablation 05/29/10, PAF, obesity, sedentary lifestyle, HLD, gout, OSA treated with CPAP, &dysfunctional diaphragm with chronic dyspneapreviouslyfollowed by Dr. [name]- now with Dr. [name]. He has mild CAD per cath back in August of 2011. Labs are checked by primary care.   Seen back in February of 2016 by Dr. [name] - this was for evaluation of dyspnea. Dr. [name] wanted cardiac status reevaluated to assess for any cardiac issues that may be contributing to dyspnea. Was referred for Myoview and echocardiogram. These studies were satisfactory. Encouraged to cut back on his alcohol intake as well.   I have followed him since - he has been holding his own - dyspnea is chronic.He has gotten back with pulmonary -Dr. [name].Balance issues and has had some falls.Has ended up having documented AF -was placed onanticoagulation - initially with Eliquis - but then transitioned to Coumadin due to costand then back to Eliquisbut was stopped due to GI bleeding - had chronic gastritis on EGD & 3 polyps removed - had required transfusion. Had had lots of epistaxis as well. Really struggling with the pandemic - not able to go to rehab -remains pretty short of breath with all activity - heis nowon oxygen and had transitioned to a walker.He has had some falls.  He had COVIDback in Marchdespite being vaccinated - was in the ER - did not wish to be admitted despite having associated CHF.  Then hadseveral phone calls - BP wasup - medicines were not clear - then  noted that he was having swelling and weight gain. Very difficult to figure out over the phone - thus asked him to come to the office today for assessment.BP still up.Spendingwaytoo much time in bed. I increased his beta blocker. Ended up adding Norvasc as well.Improved with salt restriction, increasing activity - to the point that we were able to cut medicines back again.Echo was reassuring. When seen in August - medicines seemed a little unclear again. Wife was out of town. Breathing was stable. He had had a fall - no injury.   We did a virtual visit in October - he is having much more difficulty with ambulating and getting into our office. Balance is poor. BP seemed to be ok. HR  48 and he was not dizzy - ?of PVCs not picked up - overall seemed to be ok.   Comes in today. Here with   Past Medical History:  Diagnosis Date  . Arthritis   . BPH (benign prostatic hyperplasia)   . COPD (chronic obstructive pulmonary disease) ([place])   . Coronary artery ectasia    Mild CAD with normal systolic function per cath in August of 2011  . Depression   . Diaphragmatic paralysis    felt to be partially responsible for dyspnea  . Dyspnea    due to paralyzed diaphragm and pulmonary issues  . Dysrhythmia   . GERD (gastroesophageal reflux disease)   . Gout   . HTN (hypertension)   . Hypercholesteremia   . Iron deficiency anemia   .  Microhematuria   . Obesity   . OSA (obstructive sleep apnea)    CPAP  . Paroxysmal atrial fibrillation (HCC)    occured in the setting of acute E Coli sepsis and ileius 7/12  . PVC (premature ventricular contraction)    s/p PVC ablation 05/29/2010    Past Surgical History:  Procedure Laterality Date  . APPENDECTOMY  1978  . BIOPSY  02/09/2019   Procedure: BIOPSY;  Surgeon: Clarene Essex, MD;  Location: [facility];  Service: Endoscopy;;  . BIOPSY  03/02/2019   Procedure: BIOPSY;  Surgeon: Clarene Essex, MD;  Location: [facility];  Service: Endoscopy;;  . CARDIAC  CATHETERIZATION  04-30-2010   Left main coronary artery is normal.   . CARDIOVASCULAR STRESS TEST  03-19-2010   0%  . CHOLECYSTECTOMY  1990  . COLONOSCOPY N/A 03/02/2019   Procedure: COLONOSCOPY;  Surgeon: Clarene Essex, MD;  [facility];  Service: Endoscopy;  Laterality: N/A;  . COLONOSCOPY W/ POLYPECTOMY    . ESOPHAGOGASTRODUODENOSCOPY (EGD) WITH PROPOFOL N/A 02/09/2019   Procedure: ESOPHAGOGASTRODUODENOSCOPY (EGD) WITH PROPOFOL;  Surgeon: Clarene Essex, MD;  [facility];  Service: Endoscopy;  Laterality: N/A;  . EYE SURGERY Right    catarct  . poly removed from nose as a child    . PVC ablation  05/29/2010  . US ECHOCARDIOGRAPHY  03-09-2010   EF 55-60%     Medications: No outpatient medications have been marked as taking for the 08/16/20 encounter (Appointment) with Burtis Junes, NP.     Allergies: No Known Allergies  Social History: The patient  reports that he quit smoking about 35 years ago. His smoking use included cigarettes. He has a 60.00 pack-year smoking history. He has never used smokeless tobacco. He reports current alcohol use of about 7.0 standard drinks of alcohol per week. He reports that he does not use drugs.   Family History: The patient's ***family history includes Aneurysm (age of onset: 2) in his father; Breast cancer in his daughter and mother.   Review of Systems: Please see the history of present illness.   All other systems are reviewed and negative.   Physical Exam: VS:  There were no vitals taken for this visit. Marland Kitchen  BMI There is no height or weight on file to calculate BMI.  Wt Readings from Last 3 Encounters:  06/19/20 199 lb (90.3 kg)  04/19/20 197 lb (89.4 kg)  01/19/20 200 lb 6.4 oz (90.9 kg)    General: Pleasant. Well developed, well nourished and in no acute distress.   HEENT: Normal.  Neck: Supple, no JVD, carotid bruits, or masses noted.  Cardiac: ***Regular rate and rhythm. No murmurs, rubs, or gallops. No edema.   Respiratory:  Lungs are clear to auscultation bilaterally with normal work of breathing.  GI: Soft and nontender.  MS: No deformity or atrophy. Gait and ROM intact.  Skin: Warm and dry. Color is normal.  Neuro:  Strength and sensation are intact and no gross focal deficits noted.  Psych: Alert, appropriate and with normal affect.   LABORATORY DATA:  EKG:  EKG {ACTION; IS/IS [id number]} ordered today.  Personally reviewed by me. This demonstrates ***.  Lab Results  Component Value Date   WBC 6.3 04/19/2020   HGB 13.2 04/19/2020   HCT 39.6 04/19/2020   PLT 148 (L) 04/19/2020   GLUCOSE 92 04/19/2020   ALT 13 12/08/2019   AST 22 12/08/2019   NA 139 04/19/2020   K 3.6 04/19/2020   CL 100  04/19/2020   CREATININE 1.34 (H) 04/19/2020   BUN 40 (H) 04/19/2020   CO2 26 04/19/2020   TSH 2.640 01/05/2019   INR 1.3 (H) 01/05/2019       BNP (last 3 results) Recent Labs    11/14/19 1850  BNP 1,615.9*    ProBNP (last 3 results) No results for input(s): PROBNP in the last 8760 hours.   Other Studies Reviewed Today:  ECHOIMPRESSIONS3/2021  1. Incessant ectopy makes wall motion analysis challenging. Cannot  confidently exclude basal inferolateral hypokinesis, but uspect  PVC-related artifact. Other wall segments contract normally. Left  ventricular ejection fraction, by estimation, is 55 to  60%. The left ventricle has normal function. The left ventricle has no  regional wall motion abnormalities. There is moderate concentric left  ventricular hypertrophy. Left ventricular diastolic function could not be  evaluated.  2. Right ventricular systolic function is normal. The right ventricular  size is normal. The estimated right ventricular systolic pressure is 58.5  mmHg.  3. Left atrial size was severely dilated.  4. The mitral valve is normal in structure. Mild mitral valve  regurgitation.  5. The aortic valve is tricuspid. Aortic valve regurgitation is trivial.   Mild to moderate aortic valve sclerosis/calcification is present, without  any evidence of aortic stenosis.  6. The inferior vena cava is dilated in size with >50% respiratory  variability, suggesting right atrial pressure of 8 mmHg.    Holter 02/2018 Narrative & Impression    Sinus rhythm with first degree AV block, Right bundle branch block Average heart rate is 62 bpm Occasional premature ventricular contractions Frequent premature atrial contractions with nonsustained atrial tachycardia Nonsustained atrial fibrillation also observed Nocturnal bradycardia with mobitz I second degree AV block is noted transiently No sustained arrhythmias No prolonged pauses      Myoview Impression from March 2016 Exercise Capacity: Lexiscan with no exercise. BP Response: Normal blood pressure response. Clinical Symptoms: Nausea. ECG Impression: Atrial fibrillation, no changes from baseline.  Comparison with Prior Nuclear Study: No images to compare  Overall Impression: Low risk stress nuclear study a medium-sized basal inferior and basal to mid inferolateral perfusion defect that is actually worse at rest than with stress. No ischemia. Given normal wall motion, this may represent diaphragmatic attenuation. .  LV Ejection Fraction: 63%. LV Wall Motion: NL LV Function; NL Wall Motion   Loralie Champagne 11/07/2014     ASSESSMENT & PLAN:   1. HTN - BP looks good - hopefully his medicines are correct - I always worry a bit about this. He sounds good overall.   2. PAF - no longer a candidate due to prior GI bleed/poor balance.  3. Chronic DOE - multifactorial - seems stable.   4. Bradycardia - ?PVC's - will check EKG on return - he is not symptomatic.   5. Depression - this is chronic.   6. HLD - on statin  7. CAD - managed conservatively.    Current medicines are reviewed with the patient today.  The patient does not have concerns regarding medicines  other than what has been noted above.  The following changes have been made:  See above.  Labs/ tests ordered today include:   No orders of the defined types were placed in this encounter.    Disposition:   FU with *** in {gen number [id number]} {Days to years:10300}.   Patient is agreeable to this plan and will call if any problems develop in the interim.   Signed: Truitt Merle, NP  08/02/2020 10:05 AM  [place] [address] [place], [place]  [address] Phone: [phone] Fax: [phone]

## 2020-08-15 ENCOUNTER — Telehealth: Payer: Self-pay | Admitting: *Deleted

## 2020-08-15 ENCOUNTER — Inpatient Hospital Stay (HOSPITAL_COMMUNITY)
Admission: EM | Admit: 2020-08-15 | Discharge: 2020-08-17 | DRG: 087 | Disposition: A | Discharge status: Home / Self Care | Payer: Medicare Other | Attending: General Surgery | Admitting: General Surgery

## 2020-08-15 ENCOUNTER — Other Ambulatory Visit: Payer: Self-pay

## 2020-08-15 ENCOUNTER — Emergency Department (HOSPITAL_COMMUNITY): Payer: Medicare Other

## 2020-08-15 ENCOUNTER — Encounter (HOSPITAL_COMMUNITY): Payer: Self-pay | Admitting: Emergency Medicine

## 2020-08-15 DIAGNOSIS — R402362 Coma scale, best motor response, obeys commands, at arrival to emergency department: Secondary | ICD-10-CM | POA: Diagnosis present

## 2020-08-15 DIAGNOSIS — R54 Age-related physical debility: Secondary | ICD-10-CM | POA: Diagnosis present

## 2020-08-15 DIAGNOSIS — I10 Essential (primary) hypertension: Secondary | ICD-10-CM | POA: Diagnosis present

## 2020-08-15 DIAGNOSIS — R402252 Coma scale, best verbal response, oriented, at arrival to emergency department: Secondary | ICD-10-CM | POA: Diagnosis present

## 2020-08-15 DIAGNOSIS — S065X9A Traumatic subdural hemorrhage with loss of consciousness of unspecified duration, initial encounter: Secondary | ICD-10-CM | POA: Diagnosis present

## 2020-08-15 DIAGNOSIS — Y92512 Supermarket, store or market as the place of occurrence of the external cause: Secondary | ICD-10-CM | POA: Diagnosis not present

## 2020-08-15 DIAGNOSIS — S065XAA Traumatic subdural hemorrhage with loss of consciousness status unknown, initial encounter: Secondary | ICD-10-CM | POA: Diagnosis present

## 2020-08-15 DIAGNOSIS — J449 Chronic obstructive pulmonary disease, unspecified: Secondary | ICD-10-CM | POA: Diagnosis present

## 2020-08-15 DIAGNOSIS — Z20822 Contact with and (suspected) exposure to covid-19: Secondary | ICD-10-CM | POA: Diagnosis present

## 2020-08-15 DIAGNOSIS — I4891 Unspecified atrial fibrillation: Secondary | ICD-10-CM | POA: Diagnosis present

## 2020-08-15 DIAGNOSIS — S065X0A Traumatic subdural hemorrhage without loss of consciousness, initial encounter: Secondary | ICD-10-CM | POA: Diagnosis present

## 2020-08-15 DIAGNOSIS — W1830XA Fall on same level, unspecified, initial encounter: Secondary | ICD-10-CM | POA: Diagnosis present

## 2020-08-15 DIAGNOSIS — Z7901 Long term (current) use of anticoagulants: Secondary | ICD-10-CM

## 2020-08-15 DIAGNOSIS — I2510 Atherosclerotic heart disease of native coronary artery without angina pectoris: Secondary | ICD-10-CM | POA: Diagnosis present

## 2020-08-15 DIAGNOSIS — R402142 Coma scale, eyes open, spontaneous, at arrival to emergency department: Secondary | ICD-10-CM | POA: Diagnosis present

## 2020-08-15 DIAGNOSIS — Y9301 Activity, walking, marching and hiking: Secondary | ICD-10-CM | POA: Diagnosis present

## 2020-08-15 DIAGNOSIS — S0101XA Laceration without foreign body of scalp, initial encounter: Secondary | ICD-10-CM | POA: Diagnosis present

## 2020-08-15 HISTORY — DX: Essential (primary) hypertension: I10

## 2020-08-15 HISTORY — DX: Atherosclerotic heart disease of native coronary artery without angina pectoris: I25.10

## 2020-08-15 HISTORY — DX: Unspecified atrial fibrillation: I48.91

## 2020-08-15 LAB — CBC WITH DIFFERENTIAL/PLATELET
Abs Immature Granulocytes: 0.02 10*3/uL (ref 0.00–0.07)
Basophils Absolute: 0 10*3/uL (ref 0.0–0.1)
Basophils Relative: 1 %
Eosinophils Absolute: 0.1 10*3/uL (ref 0.0–0.5)
Eosinophils Relative: 1 %
HCT: 40 % (ref 39.0–52.0)
Hemoglobin: 12.9 g/dL — ABNORMAL LOW (ref 13.0–17.0)
Immature Granulocytes: 0 %
Lymphocytes Relative: 21 %
Lymphs Abs: 1.3 10*3/uL (ref 0.7–4.0)
MCH: 32.4 pg (ref 26.0–34.0)
MCHC: 32.3 g/dL (ref 30.0–36.0)
MCV: 100.5 fL — ABNORMAL HIGH (ref 80.0–100.0)
Monocytes Absolute: 0.4 10*3/uL (ref 0.1–1.0)
Monocytes Relative: 7 %
Neutro Abs: 4.2 10*3/uL (ref 1.7–7.7)
Neutrophils Relative %: 70 %
Platelets: 141 10*3/uL — ABNORMAL LOW (ref 150–400)
RBC: 3.98 MIL/uL — ABNORMAL LOW (ref 4.22–5.81)
RDW: 14.6 % (ref 11.5–15.5)
WBC: 6 10*3/uL (ref 4.0–10.5)
nRBC: 0 % (ref 0.0–0.2)

## 2020-08-15 LAB — RESP PANEL BY RT-PCR (FLU A&B, COVID) ARPGX2
Influenza A by PCR: NEGATIVE
Influenza B by PCR: NEGATIVE
SARS Coronavirus 2 by RT PCR: NEGATIVE

## 2020-08-15 LAB — BASIC METABOLIC PANEL
Anion gap: 11 (ref 5–15)
BUN: 26 mg/dL — ABNORMAL HIGH (ref 8–23)
CO2: 30 mmol/L (ref 22–32)
Calcium: 9 mg/dL (ref 8.9–10.3)
Chloride: 97 mmol/L — ABNORMAL LOW (ref 98–111)
Creatinine, Ser: 1.02 mg/dL (ref 0.61–1.24)
GFR, Estimated: >60 mL/min (ref >60)
Glucose, Bld: 91 mg/dL (ref 70–99)
Potassium: 3.4 mmol/L — ABNORMAL LOW (ref 3.5–5.1)
Sodium: 138 mmol/L (ref 135–145)

## 2020-08-15 LAB — MRSA PCR SCREENING: MRSA by PCR: NEGATIVE

## 2020-08-15 LAB — PROTIME-INR
INR: 1 (ref 0.8–1.2)
Prothrombin Time: 13 seconds (ref 11.4–15.2)

## 2020-08-15 MED ORDER — SERTRALINE HCL 100 MG PO TABS
100.0000 mg | ORAL_TABLET | Freq: Every day | ORAL | Status: DC
  Administered 2020-08-15 – 2020-08-17 (×3): 100 mg via ORAL
  Filled 2020-08-15: qty 2
  Filled 2020-08-15: qty 1
  Filled 2020-08-15: qty 2

## 2020-08-15 MED ORDER — LIDOCAINE-EPINEPHRINE 1 %-1:100000 IJ SOLN
20.0000 mL | Freq: Once | INTRAMUSCULAR | Status: AC
  Administered 2020-08-15: 20 mL
  Filled 2020-08-15: qty 1

## 2020-08-15 MED ORDER — LABETALOL HCL 5 MG/ML IV SOLN
10.0000 mg | INTRAVENOUS | Status: DC | PRN
  Administered 2020-08-15: 10 mg via INTRAVENOUS
  Filled 2020-08-15: qty 4

## 2020-08-15 MED ORDER — SODIUM CHLORIDE 0.9 % IV SOLN: INTRAVENOUS | Status: DC

## 2020-08-15 MED ORDER — CHLORHEXIDINE GLUCONATE CLOTH 2 % EX PADS
6.0000 | MEDICATED_PAD | Freq: Every day | CUTANEOUS | Status: DC
  Administered 2020-08-15 – 2020-08-17 (×2): 6 via TOPICAL

## 2020-08-15 MED ORDER — TRAZODONE HCL 50 MG PO TABS: 100.0000 mg | ORAL_TABLET | Freq: Every evening | ORAL | Status: DC | PRN

## 2020-08-15 MED ORDER — HYDROCHLOROTHIAZIDE 25 MG PO TABS
25.0000 mg | ORAL_TABLET | Freq: Every day | ORAL | Status: DC
  Administered 2020-08-15 – 2020-08-17 (×3): 25 mg via ORAL
  Filled 2020-08-15 (×3): qty 1

## 2020-08-15 MED ORDER — ACETAMINOPHEN 325 MG PO TABS: 650.0000 mg | ORAL_TABLET | ORAL | Status: DC | PRN

## 2020-08-15 MED ORDER — LABETALOL HCL 5 MG/ML IV SOLN
10.0000 mg | Freq: Once | INTRAVENOUS | Status: AC
  Administered 2020-08-15: 10 mg via INTRAVENOUS
  Filled 2020-08-15: qty 4

## 2020-08-15 MED ORDER — HYDRALAZINE HCL 20 MG/ML IJ SOLN
5.0000 mg | INTRAMUSCULAR | Status: DC | PRN
  Administered 2020-08-15: 10 mg via INTRAVENOUS
  Filled 2020-08-15: qty 1

## 2020-08-15 MED ORDER — BUPROPION HCL ER (SR) 100 MG PO TB12
100.0000 mg | ORAL_TABLET | Freq: Two times a day (BID) | ORAL | Status: DC
  Administered 2020-08-16 – 2020-08-17 (×3): 100 mg via ORAL
  Filled 2020-08-15 (×6): qty 1

## 2020-08-15 MED ORDER — ACETAMINOPHEN 500 MG PO TABS
500.0000 mg | ORAL_TABLET | Freq: Once | ORAL | Status: AC
  Administered 2020-08-15: 500 mg via ORAL
  Filled 2020-08-15: qty 1

## 2020-08-15 MED ORDER — TRAMADOL HCL 50 MG PO TABS: 50.0000 mg | ORAL_TABLET | Freq: Four times a day (QID) | ORAL | Status: DC | PRN

## 2020-08-15 MED ORDER — MORPHINE SULFATE (PF) 4 MG/ML IV SOLN
4.0000 mg | INTRAVENOUS | Status: DC | PRN
  Administered 2020-08-15 – 2020-08-16 (×2): 4 mg via INTRAVENOUS
  Filled 2020-08-15 (×2): qty 1

## 2020-08-15 MED ORDER — METHOCARBAMOL 1000 MG/10ML IJ SOLN
500.0000 mg | Freq: Three times a day (TID) | INTRAVENOUS | Status: DC | PRN
  Filled 2020-08-15 (×2): qty 5

## 2020-08-15 MED ORDER — METOPROLOL TARTRATE 25 MG PO TABS
25.0000 mg | ORAL_TABLET | Freq: Two times a day (BID) | ORAL | Status: DC
  Administered 2020-08-15 – 2020-08-17 (×4): 25 mg via ORAL
  Filled 2020-08-15 (×4): qty 1

## 2020-08-15 MED ORDER — UMECLIDINIUM-VILANTEROL 62.5-25 MCG/INH IN AEPB
1.0000 | INHALATION_SPRAY | Freq: Every day | RESPIRATORY_TRACT | Status: DC
  Administered 2020-08-16 – 2020-08-17 (×2): 1 via RESPIRATORY_TRACT
  Filled 2020-08-15: qty 14

## 2020-08-15 MED ORDER — POTASSIUM CHLORIDE IN NACL 20-0.9 MEQ/L-% IV SOLN
INTRAVENOUS | Status: DC
  Filled 2020-08-15 (×2): qty 1000

## 2020-08-15 MED ORDER — PANTOPRAZOLE SODIUM 40 MG PO TBEC
40.0000 mg | DELAYED_RELEASE_TABLET | Freq: Every day | ORAL | Status: DC
  Administered 2020-08-15 – 2020-08-17 (×3): 40 mg via ORAL
  Filled 2020-08-15 (×3): qty 1

## 2020-08-15 NOTE — ED Provider Notes (Signed)
4:00 PM Care assumed from Dr. Tomi Bamberger.  At time of transfer care, we are awaiting consultation for admission to the trauma service for further monitoring and management of fall with head injury, repaired scalp laceration, and subdural hematoma.  Neurosurgery was called and will write a note but they did not feel he needs repeat head CT if there is no blood thinner use.  The patient reportedly told the previous team that he was on blood thinners but we cannot find documentation of this with either the previous team or the pharmacy team.  Patient's blood pressures are still very elevated over 161 systolic with the subdural.  Labetalol was ordered for blood pressure management.  Given the patient's age, uncontrolled blood pressure, new subdural, and falls, he will need admission for further management.  Trauma team called.  Trauma will admit for further management of subdural hematoma after a fall and the patient was a level 2 trauma on arrival.  Clinical Impression: 1. Subdural hematoma ([place])   2. Laceration of scalp, initial encounter   3. Hypertension, unspecified type     Disposition: Admit  This note was prepared with assistance of Dragon voice recognition software. Occasional wrong-word or sound-a-like substitutions may have occurred due to the inherent limitations of voice recognition software.     Marquia Costello, Gwenyth Allegra, MD 08/15/20 [phone]

## 2020-08-15 NOTE — Telephone Encounter (Signed)
Noted  

## 2020-08-15 NOTE — Progress Notes (Signed)
Dr. Kae Heller paged about BP > 170 after PRN labetalol dose, hydralazine 5-10 mg Q2 PRN ordered.

## 2020-08-15 NOTE — Progress Notes (Signed)
Orthopedic Tech Progress Note Patient Details:  [name] 05/06/1934 [id number] LEVEL 2 TRAUMA Patient ID: [name], male   DOB: [birth year], 86 y.o.   MRN: [id number]   [name] 08/15/2020, 2:19 PM

## 2020-08-15 NOTE — ED Notes (Signed)
Patient transported to CT 

## 2020-08-15 NOTE — ED Triage Notes (Signed)
Pt brought to ED by [facility] from [employer] after he fell and hit his head on blood thinners. Laceration present on the back of his head, per staff pt had a small period of LOC from 30- 1 min. Pt is AO x 4 on ED arrival, no neuro deficit noticed. BP 230/190, HR 70, SPO2 95 on RA.

## 2020-08-15 NOTE — H&P (Signed)
[name] is an 86 y.o. male.   Chief Complaint: fall HPI: 86yo M was shopping at [employer] when he reports his leg gave out and he fell. He struck the back of his head and sustained a laceration. No LOC. He was transported by EMS for evaluation in the ED. He was a level 2 trauma because he reported he takes anticoagulation for afib. Looking at his cardiology notes, they have stopped his anticoagulation. Work-up showed a parafalcine and tentorial SDH. He is also hypertensive. Trauma was asked to admit. He C/O a mild HA.   Past Medical History:  Diagnosis Date  . Afib ([place])   . COPD (chronic obstructive pulmonary disease) ([place])   . Coronary artery disease   . Hypertension     History reviewed. No pertinent surgical history.  No family history on file. Social History:  reports that he has never smoked. He has never used smokeless tobacco. He reports that he does not drink alcohol and does not use drugs.  Allergies: No Known Allergies  (Not in a hospital admission)   Results for orders placed or performed during the hospital encounter of 08/15/20 (from the past 48 hour(s))  CBC with Differential     Status: Abnormal   Collection Time: 08/15/20  2:16 PM  Result Value Ref Range   WBC 6.0 4.0 - 10.5 K/uL   RBC 3.98 (L) 4.22 - 5.81 MIL/uL   Hemoglobin 12.9 (L) 13.0 - 17.0 g/dL   HCT 40.0 39 - 52 %   MCV 100.5 (H) 80.0 - 100.0 fL   MCH 32.4 26.0 - 34.0 pg   MCHC 32.3 30.0 - 36.0 g/dL   RDW 14.6 11.5 - 15.5 %   Platelets 141 (L) 150 - 400 K/uL    Comment: REPEATED TO VERIFY   nRBC 0.0 0.0 - 0.2 %   Neutrophils Relative % 70 %   Neutro Abs 4.2 1.7 - 7.7 K/uL   Lymphocytes Relative 21 %   Lymphs Abs 1.3 0.7 - 4.0 K/uL   Monocytes Relative 7 %   Monocytes Absolute 0.4 0.1 - 1.0 K/uL   Eosinophils Relative 1 %   Eosinophils Absolute 0.1 0.0 - 0.5 K/uL   Basophils Relative 1 %   Basophils Absolute 0.0 0.0 - 0.1 K/uL   Immature Granulocytes 0 %   Abs Immature Granulocytes 0.02  0.00 - 0.07 K/uL    Comment: Performed at [facility], [address], [place], [place] [address]  Basic metabolic panel     Status: Abnormal   Collection Time: 08/15/20  2:16 PM  Result Value Ref Range   Sodium 138 135 - 145 mmol/L   Potassium 3.4 (L) 3.5 - 5.1 mmol/L   Chloride 97 (L) 98 - 111 mmol/L   CO2 30 22 - 32 mmol/L   Glucose, Bld 91 70 - 99 mg/dL    Comment: Glucose reference range applies only to samples taken after fasting for at least 8 hours.   BUN 26 (H) 8 - 23 mg/dL   Creatinine, Ser 1.02 0.61 - 1.24 mg/dL   Calcium 9.0 8.9 - 10.3 mg/dL   GFR, Estimated >60 >60 mL/min    Comment: (NOTE) Calculated using the CKD-EPI Creatinine Equation (2021)    Anion gap 11 5 - 15    Comment: Performed at [facility] [address], [place], [place] [address]  Protime-INR     Status: None   Collection Time: 08/15/20  2:16 PM  Result Value Ref Range   Prothrombin Time 13.0 11.4 - 15.2 seconds   INR 1.0 0.8 - 1.2    Comment: (NOTE) INR goal varies based on device and disease states. Performed at [facility], [place] [address], [place], [place] [address]    CT Head Wo Contrast  Result Date: 08/15/2020 CLINICAL DATA:  Fall. EXAM: CT HEAD WITHOUT CONTRAST CT CERVICAL SPINE WITHOUT CONTRAST TECHNIQUE: Multidetector CT imaging of the head and cervical spine was performed following the standard protocol without intravenous contrast. Multiplanar CT image reconstructions of the cervical spine were also generated. COMPARISON:  CT head dated January 05, 2019. FINDINGS: CT HEAD FINDINGS Brain: High left-sided parafalcine subdural hematoma measuring up to 5 mm in maximal thickness (series 3, image 29). This tracks inferiorly along the posterior falx and tentorium (series 3, image 13). No evidence of acute infarction, intraparenchymal hemorrhage, hydrocephalus, or mass lesion/mass effect. Stable atrophy. Vascular: Calcified atherosclerosis at the skullbase. No hyperdense vessel. No  hyperdense vessel. Skull: Normal. Negative for fracture or focal lesion. Sinuses/Orbits: No acute finding. Other: Small posterior scalp hematoma. CT CERVICAL SPINE FINDINGS Alignment: Normal. Skull base and vertebrae: No acute fracture. No primary bone lesion or focal pathologic process. Soft tissues and spinal canal: No prevertebral fluid or swelling. No visible canal hematoma. Disc levels: Mild disc height loss at C5-C6 and C6-C7. Relatively diffuse right greater than left facet arthropathy. Upper chest: Negative. Other: None. IMPRESSION: 1. High left-sided parafalcine subdural hematoma measuring up to 5 mm in maximal thickness, tracking inferiorly along the posterior falx and tentorium. No mass effect. 2. Small posterior scalp hematoma. 3. No acute cervical spine fracture or traumatic listhesis. Critical Value/emergent results were called by telephone at the time of interpretation on 08/15/2020 at 3:25 pm to provider [facility] , who verbally acknowledged these results. Electronically Signed   By: Titus Dubin M.D.   On: 08/15/2020 15:25   CT Cervical Spine Wo Contrast  Result Date: 08/15/2020 CLINICAL DATA:  Fall. EXAM: CT HEAD WITHOUT CONTRAST CT CERVICAL SPINE WITHOUT CONTRAST TECHNIQUE: Multidetector CT imaging of the head and cervical spine was performed following the standard protocol without intravenous contrast. Multiplanar CT image reconstructions of the cervical spine were also generated. COMPARISON:  CT head dated January 05, 2019. FINDINGS: CT HEAD FINDINGS Brain: High left-sided parafalcine subdural hematoma measuring up to 5 mm in maximal thickness (series 3, image 29). This tracks inferiorly along the posterior falx and tentorium (series 3, image 13). No evidence of acute infarction, intraparenchymal hemorrhage, hydrocephalus, or mass lesion/mass effect. Stable atrophy. Vascular: Calcified atherosclerosis at the skullbase. No hyperdense vessel. No hyperdense vessel. Skull: Normal. Negative for  fracture or focal lesion. Sinuses/Orbits: No acute finding. Other: Small posterior scalp hematoma. CT CERVICAL SPINE FINDINGS Alignment: Normal. Skull base and vertebrae: No acute fracture. No primary bone lesion or focal pathologic process. Soft tissues and spinal canal: No prevertebral fluid or swelling. No visible canal hematoma. Disc levels: Mild disc height loss at C5-C6 and C6-C7. Relatively diffuse right greater than left facet arthropathy. Upper chest: Negative. Other: None. IMPRESSION: 1. High left-sided parafalcine subdural hematoma measuring up to 5 mm in maximal thickness, tracking inferiorly along the posterior falx and tentorium. No mass effect. 2. Small posterior scalp hematoma. 3. No acute cervical spine fracture or traumatic listhesis. Critical Value/emergent results were called by telephone at the time of interpretation on 08/15/2020 at 3:25 pm to provider [facility] , who verbally acknowledged these results. Electronically Signed   By:  Titus Dubin M.D.   On: 08/15/2020 15:25   DG Chest Portable 1 View  Result Date: 08/15/2020 CLINICAL DATA:  Fall, hypoxia EXAM: PORTABLE CHEST 1 VIEW COMPARISON:  11/14/2019 chest radiograph. FINDINGS: Right rotated chest radiograph with low lung volumes. Stable cardiomediastinal silhouette with normal heart size. No pneumothorax. No pleural effusion. No pulmonary edema. Mild curvilinear right lung base opacity, favor scarring versus atelectasis. IMPRESSION: Low lung volumes with mild curvilinear right lung base opacity, favor scarring versus atelectasis. Electronically Signed   By: Ilona Sorrel M.D.   On: 08/15/2020 15:54    Review of Systems  Constitutional: Negative.   HENT:       HA, scalp lac  Eyes: Negative.   Respiratory: Negative.   Cardiovascular: Negative.   Gastrointestinal: Negative.   Endocrine: Negative.   Genitourinary: Negative.   Musculoskeletal:       Intermittent leg weakness  Skin: Negative.   Allergic/Immunologic: Negative.    Neurological: Negative for tremors and speech difficulty.  Hematological: Negative.   Psychiatric/Behavioral: Negative.     Blood pressure (!) 214/100, pulse 67, temperature 98.6 F (37 C), temperature source Oral, resp. rate (!) 33, height 5\' 10"  (1.778 m), weight 90.7 kg, SpO2 99 %. Physical Exam Constitutional:      General: He is not in acute distress.    Appearance: He is not ill-appearing.  HENT:     Head:     Comments: Posterior scalp lac 4cm with staples    Right Ear: External ear normal.     Left Ear: External ear normal.     Nose: No congestion.     Mouth/Throat:     Mouth: Mucous membranes are moist.  Eyes:     General: No scleral icterus.    Extraocular Movements: Extraocular movements intact.     Pupils: Pupils are equal, round, and reactive to light.  Cardiovascular:     Rate and Rhythm: Normal rate. Rhythm irregular.     Pulses: Normal pulses.  Pulmonary:     Effort: Pulmonary effort is normal. No respiratory distress.     Breath sounds: Normal breath sounds. No stridor. No wheezing, rhonchi or rales.  Abdominal:     General: Abdomen is flat. There is no distension.     Palpations: Abdomen is soft. There is no mass.     Tenderness: There is no abdominal tenderness. There is no guarding or rebound.  Musculoskeletal:        General: No swelling or tenderness.     Cervical back: Normal range of motion. No rigidity or tenderness.  Skin:    General: Skin is warm.     Capillary Refill: Capillary refill takes 2 to 3 seconds.  Neurological:     Mental Status: He is alert and oriented to person, place, and time.     Comments: GCS 15  Psychiatric:        Mood and Affect: Mood normal.      Assessment/Plan Fall TBI with parafalcine and tentorial SDH - Dr. Annette Stable is consulting, rec no F/U CT, follow exam Scalp laceration - repaired in ED HTN - labetalol PRN, trying to find home meds A fib  Admit to inpatient, ICU for neuro checks and BP control  Zenovia Jarred, MD 08/15/2020, 4:46 PM

## 2020-08-15 NOTE — ED Provider Notes (Signed)
[facility] EMERGENCY DEPARTMENT Provider Note   CSN: [id number] Arrival date & time: 08/15/20  1407     History Chief Complaint  Patient presents with  . Fall    [name] is a 86 y.o. male.  HPI   Patient presented to the ED for evaluation of a scalp laceration. Patient was walking in [employer] when his leg gave out and he fell to the ground. Patient ended up striking the back of his head and sustained a laceration with significant bleeding. Patient thinks he may have lost consciousness briefly. He denies any other complaints of neck pain or extremity pain. No focal numbness or weakness. Patient had a dressing applied to the back of his scalp by EMS. Patient states he has history of atrial fibrillation. He was activated a level two trauma as he takes anticoagulation. Patient was not able to tell me the name of medication he takes but states it starts with an H. I was able to review the medical records from the cardiology clinic. Patient had been taking Eliquis and Coumadin but according to the notes in August of this year he was discontinued from those medications because of recurrent epistaxis and GI bleeding.  Past Medical History:  Diagnosis Date  . Afib ([place])   . COPD (chronic obstructive pulmonary disease) ([place])   . Coronary artery disease   . Hypertension     There are no problems to display for this patient.   History reviewed. No pertinent surgical history.     No family history on file.  Social History   Tobacco Use  . Smoking status: Never Smoker  . Smokeless tobacco: Never Used  Substance Use Topics  . Alcohol use: Never  . Drug use: Never    Home Medications Prior to Admission medications   Not on File    Allergies    Patient has no known allergies.  Review of Systems   Review of Systems  All other systems reviewed and are negative.   Physical Exam Updated Vital Signs BP (!) 209/96   Pulse 61   Temp 98.6 F (37 C)  (Oral)   Resp (!) 25   Ht 1.778 m (5\' 10" )   Wt 90.7 kg   SpO2 100%   BMI 28.69 kg/m   Physical Exam Vitals and nursing note reviewed.  Constitutional:      Appearance: He is well-developed. He is not diaphoretic.     Comments: Elderly, frail  HENT:     Head: Normocephalic.     Comments: Large amount of coagulated blood in the back of his scalp, laceration noted proximally after wound was irrigated and some of the matted blood and scalp air was removed    Right Ear: External ear normal.     Left Ear: External ear normal.  Eyes:     General: No scleral icterus.       Right eye: No discharge.        Left eye: No discharge.     Conjunctiva/sclera: Conjunctivae normal.  Neck:     Trachea: No tracheal deviation.  Cardiovascular:     Rate and Rhythm: Normal rate and regular rhythm.  Pulmonary:     Effort: Pulmonary effort is normal. No respiratory distress.     Breath sounds: Normal breath sounds. No stridor. No wheezing or rales.  Abdominal:     General: Bowel sounds are normal. There is no distension.     Palpations: Abdomen is soft.  Tenderness: There is no abdominal tenderness. There is no guarding or rebound.  Musculoskeletal:        General: No tenderness.     Cervical back: Neck supple.  Skin:    General: Skin is warm.     Findings: No rash.  Neurological:     Mental Status: He is alert.     Cranial Nerves: No cranial nerve deficit (no facial droop, extraocular movements intact, no slurred speech).     Sensory: No sensory deficit.     Motor: No abnormal muscle tone or seizure activity.     Coordination: Coordination normal.     ED Results / Procedures / Treatments   Labs (all labs ordered are listed, but only abnormal results are displayed) Labs Reviewed  BASIC METABOLIC PANEL - Abnormal; Notable for the following components:      Result Value   Potassium 3.4 (*)    Chloride 97 (*)    BUN 26 (*)    All other components within normal limits  RESP PANEL BY  RT-PCR (FLU A&B, COVID) ARPGX2  PROTIME-INR  CBC WITH DIFFERENTIAL/PLATELET    EKG None  Radiology CT Head Wo Contrast  Result Date: 08/15/2020 CLINICAL DATA:  Fall. EXAM: CT HEAD WITHOUT CONTRAST CT CERVICAL SPINE WITHOUT CONTRAST TECHNIQUE: Multidetector CT imaging of the head and cervical spine was performed following the standard protocol without intravenous contrast. Multiplanar CT image reconstructions of the cervical spine were also generated. COMPARISON:  CT head dated January 05, 2019. FINDINGS: CT HEAD FINDINGS Brain: High left-sided parafalcine subdural hematoma measuring up to 5 mm in maximal thickness (series 3, image 29). This tracks inferiorly along the posterior falx and tentorium (series 3, image 13). No evidence of acute infarction, intraparenchymal hemorrhage, hydrocephalus, or mass lesion/mass effect. Stable atrophy. Vascular: Calcified atherosclerosis at the skullbase. No hyperdense vessel. No hyperdense vessel. Skull: Normal. Negative for fracture or focal lesion. Sinuses/Orbits: No acute finding. Other: Small posterior scalp hematoma. CT CERVICAL SPINE FINDINGS Alignment: Normal. Skull base and vertebrae: No acute fracture. No primary bone lesion or focal pathologic process. Soft tissues and spinal canal: No prevertebral fluid or swelling. No visible canal hematoma. Disc levels: Mild disc height loss at C5-C6 and C6-C7. Relatively diffuse right greater than left facet arthropathy. Upper chest: Negative. Other: None. IMPRESSION: 1. High left-sided parafalcine subdural hematoma measuring up to 5 mm in maximal thickness, tracking inferiorly along the posterior falx and tentorium. No mass effect. 2. Small posterior scalp hematoma. 3. No acute cervical spine fracture or traumatic listhesis. Critical Value/emergent results were called by telephone at the time of interpretation on 08/15/2020 at 3:25 pm to provider [facility] , who verbally acknowledged these results. Electronically Signed    By: Titus Dubin M.D.   On: 08/15/2020 15:25   CT Cervical Spine Wo Contrast  Result Date: 08/15/2020 CLINICAL DATA:  Fall. EXAM: CT HEAD WITHOUT CONTRAST CT CERVICAL SPINE WITHOUT CONTRAST TECHNIQUE: Multidetector CT imaging of the head and cervical spine was performed following the standard protocol without intravenous contrast. Multiplanar CT image reconstructions of the cervical spine were also generated. COMPARISON:  CT head dated January 05, 2019. FINDINGS: CT HEAD FINDINGS Brain: High left-sided parafalcine subdural hematoma measuring up to 5 mm in maximal thickness (series 3, image 29). This tracks inferiorly along the posterior falx and tentorium (series 3, image 13). No evidence of acute infarction, intraparenchymal hemorrhage, hydrocephalus, or mass lesion/mass effect. Stable atrophy. Vascular: Calcified atherosclerosis at the skullbase. No hyperdense vessel. No hyperdense vessel.  Skull: Normal. Negative for fracture or focal lesion. Sinuses/Orbits: No acute finding. Other: Small posterior scalp hematoma. CT CERVICAL SPINE FINDINGS Alignment: Normal. Skull base and vertebrae: No acute fracture. No primary bone lesion or focal pathologic process. Soft tissues and spinal canal: No prevertebral fluid or swelling. No visible canal hematoma. Disc levels: Mild disc height loss at C5-C6 and C6-C7. Relatively diffuse right greater than left facet arthropathy. Upper chest: Negative. Other: None. IMPRESSION: 1. High left-sided parafalcine subdural hematoma measuring up to 5 mm in maximal thickness, tracking inferiorly along the posterior falx and tentorium. No mass effect. 2. Small posterior scalp hematoma. 3. No acute cervical spine fracture or traumatic listhesis. Critical Value/emergent results were called by telephone at the time of interpretation on 08/15/2020 at 3:25 pm to provider [facility] , who verbally acknowledged these results. Electronically Signed   By: Titus Dubin M.D.   On: 08/15/2020 15:25     Procedures .Marland KitchenLaceration Repair  Date/Time: 08/15/2020 2:35 PM Performed by: Dorie Rank, MD Authorized by: Dorie Rank, MD   Consent:    Consent obtained:  Verbal and emergent situation   Consent given by:  Patient Universal protocol:    Procedure explained and questions answered to patient or proxy's satisfaction: yes     Relevant documents present and verified: yes     Test results available and properly labeled: yes     Imaging studies available: yes     Required blood products, implants, devices, and special equipment available: yes     Site/side marked: yes     Immediately prior to procedure, a time out was called: yes     Patient identity confirmed:  Verbally with patient Anesthesia (see MAR for exact dosages):    Anesthesia method:  Local infiltration   Local anesthetic:  Lidocaine 2% WITH epi Laceration details:    Location:  Scalp   Scalp location:  Occipital   Length (cm):  3 Repair type:    Repair type:  Simple Treatment:    Area cleansed with:  Saline   Amount of cleaning:  Standard   Irrigation solution:  Sterile saline   Irrigation method:  Pressure wash Skin repair:    Repair method:  Staples   Number of staples:  9 Approximation:    Approximation:  Close Post-procedure details:    Dressing:  Open (no dressing)   Patient tolerance of procedure:  Tolerated well, no immediate complications Comments:     Patient had persistent bleeding in the posterior aspect of his scalp when he first arrived. Bleeding stopped with direct pressure. Laceration repaired with staples emergently to help control bleeding. Bleeding stopped after procedure  .Critical Care Performed by: Dorie Rank, MD Authorized by: Dorie Rank, MD   Critical care provider statement:    Critical care time (minutes):  45   Critical care was time spent personally by me on the following activities:  Discussions with consultants, evaluation of patient's response to treatment, examination of  patient, ordering and performing treatments and interventions, ordering and review of laboratory studies, ordering and review of radiographic studies, pulse oximetry, re-evaluation of patient's condition, obtaining history from patient or surrogate and review of old charts   (including critical care time)  Medications Ordered in ED Medications  labetalol (NORMODYNE) injection 10 mg (has no administration in time range)  lidocaine-EPINEPHrine (XYLOCAINE W/EPI) 1 %-1:100000 (with pres) injection 20 mL (20 mLs Infiltration Given 08/15/20 1431)    ED Course  I have reviewed the triage vital signs and  the nursing notes.  Pertinent labs & imaging results that were available during my care of the patient were reviewed by me and considered in my medical decision making (see chart for details).  Clinical Course as of Aug 16 1555  Tue Aug 15, 2020  1524 Head ct shows small subdural hematoma.  C spine CT is negative   [JK]  1540 INR is normal.   [JK]  1540 Patient is hypertensive. Will order dose of labetalol   [JK]  1555 No need for re imaging at this time.D/w Jinny Blossom, neurosurgery, no intervention needed.   [JK]    Clinical Course User Index [JK] Dorie Rank, MD   MDM Rules/Calculators/A&P                          Patient presented to ED as a level 2 trauma after a fall and head injury resulting in a scalp laceration. Initial concerns of the patient was on anticoagulation. Patient is only able to tell me he takes blood thinning medication that begins with the letter H. I have reviewed his medical records and the last note I see he indicates that he is no longer on any anticoagulation. His INR is normal.  We will hold off on any reversal agents as I do not believe he is on any anticoagulation. I will ask our pharmacy to see if they can verify this. I will consult with neurosurgery regarding his subdural hematoma. Does not appear to require any surgical intervention at this time.  Patient will  likely require admission for observation considering his comorbidities hypertension. Final Clinical Impression(s) / ED Diagnoses Final diagnoses:  Subdural hematoma ([place])  Laceration of scalp, initial encounter      Dorie Rank, MD 08/15/20 1557

## 2020-08-15 NOTE — Progress Notes (Addendum)
Providing Compassionate, Quality Care - Together   BP (!) 212/104   Pulse 63   Temp 98.6 F (37 C) (Oral)   Resp (!) 27   Ht 5\' 10"  (1.778 m)   Wt 90.7 kg   SpO2 100%   BMI 28.69 kg/m   [name] Is an 86 year old who presented to the emergency department following all ground level fall while in [employer].   By report, he has no focal deficits. It does not appear that he is presently on anticoagulation due to adverse side effects.   CT imaging of his brain revealed a high left-sided parafalcine subdural hematoma, tracking inferiorly along the posterior falx and tentorium. There is no mass effect.  CT imaging of the patient's cervical spine revealed no acute cervical spine fracture or traumatic listhesis.  There is no surgical intervention recommended at this time. If the patient is not presently anticoagulated,  there is no need for further imaging.  If the patient is found to still be on ananticoagulant, recommend  observation and follow-up CT head in 12 hours.   CT Head Wo Contrast  Result Date: 08/15/2020 CLINICAL DATA:  Fall. EXAM: CT HEAD WITHOUT CONTRAST CT CERVICAL SPINE WITHOUT CONTRAST TECHNIQUE: Multidetector CT imaging of the head and cervical spine was performed following the standard protocol without intravenous contrast. Multiplanar CT image reconstructions of the cervical spine were also generated. COMPARISON:  CT head dated January 05, 2019. FINDINGS: CT HEAD FINDINGS Brain: High left-sided parafalcine subdural hematoma measuring up to 5 mm in maximal thickness (series 3, image 29). This tracks inferiorly along the posterior falx and tentorium (series 3, image 13). No evidence of acute infarction, intraparenchymal hemorrhage, hydrocephalus, or mass lesion/mass effect. Stable atrophy. Vascular: Calcified atherosclerosis at the skullbase. No hyperdense vessel. No hyperdense vessel. Skull: Normal. Negative for fracture or focal lesion. Sinuses/Orbits: No acute finding. Other: Small  posterior scalp hematoma. CT CERVICAL SPINE FINDINGS Alignment: Normal. Skull base and vertebrae: No acute fracture. No primary bone lesion or focal pathologic process. Soft tissues and spinal canal: No prevertebral fluid or swelling. No visible canal hematoma. Disc levels: Mild disc height loss at C5-C6 and C6-C7. Relatively diffuse right greater than left facet arthropathy. Upper chest: Negative. Other: None. IMPRESSION: 1. High left-sided parafalcine subdural hematoma measuring up to 5 mm in maximal thickness, tracking inferiorly along the posterior falx and tentorium. No mass effect. 2. Small posterior scalp hematoma. 3. No acute cervical spine fracture or traumatic listhesis. Critical Value/emergent results were called by telephone at the time of interpretation on 08/15/2020 at 3:25 pm to provider [facility] , who verbally acknowledged these results. Electronically Signed   By: Titus Dubin M.D.   On: 08/15/2020 15:25   CT Cervical Spine Wo Contrast  Result Date: 08/15/2020 CLINICAL DATA:  Fall. EXAM: CT HEAD WITHOUT CONTRAST CT CERVICAL SPINE WITHOUT CONTRAST TECHNIQUE: Multidetector CT imaging of the head and cervical spine was performed following the standard protocol without intravenous contrast. Multiplanar CT image reconstructions of the cervical spine were also generated. COMPARISON:  CT head dated January 05, 2019. FINDINGS: CT HEAD FINDINGS Brain: High left-sided parafalcine subdural hematoma measuring up to 5 mm in maximal thickness (series 3, image 29). This tracks inferiorly along the posterior falx and tentorium (series 3, image 13). No evidence of acute infarction, intraparenchymal hemorrhage, hydrocephalus, or mass lesion/mass effect. Stable atrophy. Vascular: Calcified atherosclerosis at the skullbase. No hyperdense vessel. No hyperdense vessel. Skull: Normal. Negative for fracture or focal lesion. Sinuses/Orbits: No acute  finding. Other: Small posterior scalp hematoma. CT CERVICAL SPINE  FINDINGS Alignment: Normal. Skull base and vertebrae: No acute fracture. No primary bone lesion or focal pathologic process. Soft tissues and spinal canal: No prevertebral fluid or swelling. No visible canal hematoma. Disc levels: Mild disc height loss at C5-C6 and C6-C7. Relatively diffuse right greater than left facet arthropathy. Upper chest: Negative. Other: None. IMPRESSION: 1. High left-sided parafalcine subdural hematoma measuring up to 5 mm in maximal thickness, tracking inferiorly along the posterior falx and tentorium. No mass effect. 2. Small posterior scalp hematoma. 3. No acute cervical spine fracture or traumatic listhesis. Critical Value/emergent results were called by telephone at the time of interpretation on 08/15/2020 at 3:25 pm to provider [facility] , who verbally acknowledged these results. Electronically Signed   By: Titus Dubin M.D.   On: 08/15/2020 15:25   DG Chest Portable 1 View  Result Date: 08/15/2020 CLINICAL DATA:  Fall, hypoxia EXAM: PORTABLE CHEST 1 VIEW COMPARISON:  11/14/2019 chest radiograph. FINDINGS: Right rotated chest radiograph with low lung volumes. Stable cardiomediastinal silhouette with normal heart size. No pneumothorax. No pleural effusion. No pulmonary edema. Mild curvilinear right lung base opacity, favor scarring versus atelectasis. IMPRESSION: Low lung volumes with mild curvilinear right lung base opacity, favor scarring versus atelectasis. Electronically Signed   By: Ilona Sorrel M.D.   On: 08/15/2020 15:54

## 2020-08-15 NOTE — Telephone Encounter (Signed)
S/w pt has appt tomorrow with Truitt Merle, NP.  Pt does NOT need help getting up to office.  Stated his wife might accompanied pt. Will send to [place] to [place].

## 2020-08-16 ENCOUNTER — Ambulatory Visit: Payer: Medicare Other | Admitting: Nurse Practitioner

## 2020-08-16 LAB — BASIC METABOLIC PANEL
Anion gap: 13 (ref 5–15)
BUN: 21 mg/dL (ref 8–23)
CO2: 31 mmol/L (ref 22–32)
Calcium: 8.9 mg/dL (ref 8.9–10.3)
Chloride: 94 mmol/L — ABNORMAL LOW (ref 98–111)
Creatinine, Ser: 1.07 mg/dL (ref 0.61–1.24)
GFR, Estimated: >60 mL/min (ref >60)
Glucose, Bld: 131 mg/dL — ABNORMAL HIGH (ref 70–99)
Potassium: 3.6 mmol/L (ref 3.5–5.1)
Sodium: 138 mmol/L (ref 135–145)

## 2020-08-16 LAB — CBC
HCT: 41.8 % (ref 39.0–52.0)
Hemoglobin: 13.4 g/dL (ref 13.0–17.0)
MCH: 31.8 pg (ref 26.0–34.0)
MCHC: 32.1 g/dL (ref 30.0–36.0)
MCV: 99.3 fL (ref 80.0–100.0)
Platelets: 142 10*3/uL — ABNORMAL LOW (ref 150–400)
RBC: 4.21 MIL/uL — ABNORMAL LOW (ref 4.22–5.81)
RDW: 14.5 % (ref 11.5–15.5)
WBC: 7.3 10*3/uL (ref 4.0–10.5)
nRBC: 0 % (ref 0.0–0.2)

## 2020-08-16 MED ORDER — SALINE SPRAY 0.65 % NA SOLN
1.0000 | NASAL | Status: DC | PRN
  Filled 2020-08-16: qty 44

## 2020-08-16 NOTE — Evaluation (Signed)
Occupational Therapy Evaluation Patient Details Name: [name] MRN: [id number] DOB: [birth year] Today's Date: 08/16/2020    History of Present Illness Pt is a 86 y/o male who presents after a fall while shopping at [employer]. Pt sustained a parafalcine and tentorial SDH. PMHx includes HTN, CAD, COPD, A-fib   Clinical Impression   This 86 y/o male presents with the above. PTA pt reports being mod independent with ADL and mobility tasks using SPC. Today pt presenting with deficits including decreased activity tolerance, impaired standing balance and cognition impacting his functional performance. Pt initially requiring two person assist for safe completion of functional transfers and mobility using RW, but with continued mobility pt demonstrating improvements as session progressed. He requires up to modA (+2) for LB ADL and minguard assist for seated UB ADL tasks. Pt to benefit from continued acute OT services, pending pt continued improvements feel he will reach the point of returning home with [facility] therapies. Will continue to follow for pt progress to maximize pt's safety and independence with ADL and mobility.     Follow Up Recommendations  Home health OT;Supervision/Assistance - 24 hour (pending continued progress )    Equipment Recommendations  None recommended by OT           Precautions / Restrictions Precautions Precautions: Fall Precaution Comments: watch HR, stays in 50s Restrictions Weight Bearing Restrictions: No      Mobility Bed Mobility Overal bed mobility: Needs Assistance Bed Mobility: Supine to Sit     Supine to sit: Min assist     General bed mobility comments: light assist for trunk stability, use of rail and HOB slightly elevated     Transfers Overall transfer level: Needs assistance Equipment used: Rolling walker (2 wheeled) Transfers: Sit to/from Stand Sit to Stand: Min assist;+2 physical assistance;+2 safety/equipment         General transfer  comment: pt unsteady with initial standing requiring assist to stabilize (+2 assist)    Balance Overall balance assessment: Needs assistance Sitting-balance support: Feet supported Sitting balance-Leahy Scale: Fair     Standing balance support: Bilateral upper extremity supported Standing balance-Leahy Scale: Poor Standing balance comment: reliant on UE support/external assist                           ADL either performed or assessed with clinical judgement   ADL Overall ADL's : Needs assistance/impaired Eating/Feeding: Set up;Sitting   Grooming: Min guard;Sitting   Upper Body Bathing: Min guard;Sitting   Lower Body Bathing: Moderate assistance;+2 for safety/equipment   Upper Body Dressing : Minimal assistance;Sitting   Lower Body Dressing: Moderate assistance;+2 for physical assistance;+2 for safety/equipment;Sit to/from stand Lower Body Dressing Details (indicate cue type and reason): pt donning socks but requiring assist to support LE into modified figure 4 at EOB; minA+2 for standing balance initially  Toilet Transfer: Minimal assistance;+2 for physical assistance;+2 for safety/equipment;Ambulation;RW Toilet Transfer Details (indicate cue type and reason): simulated via transfer to recliner ; room and hallway level mobility  Toileting- Clothing Manipulation and Hygiene: Maximal assistance;+2 for physical assistance;+2 for safety/equipment;Sit to/from stand       Functional mobility during ADLs: Minimal assistance;+2 for physical assistance;+2 for safety/equipment;Rolling walker General ADL Comments: pt's mobility improved as session progressed - initially very unsteady      Vision Baseline Vision/History: Wears glasses Wears Glasses: Reading only       Perception     Praxis      Pertinent Vitals/Pain Pain  Assessment: 0-10 Pain Score: 7  Pain Location: headache Pain Descriptors / Indicators: Headache Pain Intervention(s): Monitored during session      Hand Dominance Right   Extremity/Trunk Assessment Upper Extremity Assessment Upper Extremity Assessment: Generalized weakness   Lower Extremity Assessment Lower Extremity Assessment: Defer to PT evaluation       Communication Communication Communication: HOH   Cognition Arousal/Alertness: Awake/alert Behavior During Therapy: WFL for tasks assessed/performed Overall Cognitive Status: Impaired/Different from baseline Area of Impairment: Attention;Memory;Following commands;Problem solving                   Current Attention Level: Selective Memory: Decreased recall of precautions Following Commands: Follows one step commands with increased time     Problem Solving: Slow processing;Requires verbal cues;Requires tactile cues General Comments: pt HOH so often requires repetition, noted some confusion with responses to basic questiosn (when asking to rate pain levels); likely will benefit from further assessment    General Comments  HR maintaining in the 50s for the majority of session, SpO2 >90% on 2L O2    Exercises     Shoulder Instructions      Home Living Family/patient expects to be discharged to:: Private residence Living Arrangements: Spouse/significant other Available Help at Discharge: Family;Available PRN/intermittently (spouse substitute teaches ) Type of Home: House (townhouse) Home Access: Stairs to enter [employer] of Steps: 4 Entrance Stairs-Rails: Right;Left Home Layout: Two level Alternate Level Stairs-Number of Steps: flight Alternate Level Stairs-Rails:  (chair lift to second floor, flight to basement) Bathroom Shower/Tub: Teacher, early years/pre: Handicapped height     Home Equipment: [place] - single point;Tub bench;Grab bars - toilet;Grab bars - tub/shower;Walker - 4 wheels;Cane - quad      Lives With: Spouse    Prior Functioning/Environment Level of Independence: Independent        Comments: drives, [place] [employer]        OT Problem List: Decreased strength;Decreased range of motion;Decreased activity tolerance;Impaired balance (sitting and/or standing);Cardiopulmonary status limiting activity;Decreased knowledge of precautions;Decreased knowledge of use of DME or AE;Decreased cognition      OT Treatment/Interventions: Self-care/ADL training;Therapeutic exercise;Energy conservation;DME and/or AE instruction;Therapeutic activities;Cognitive remediation/compensation;Visual/perceptual remediation/compensation;Patient/family education;Balance training    OT Goals(Current goals can be found in the care plan section) Acute Rehab OT Goals Patient Stated Goal: home OT Goal Formulation: With patient Time For Goal Achievement: 08/30/20 Potential to Achieve Goals: Good  OT Frequency: Min 2X/week   Barriers to D/C:            Co-evaluation PT/OT/SLP Co-Evaluation/Treatment: Yes Reason for Co-Treatment: For patient/therapist safety;To address functional/ADL transfers;Necessary to address cognition/behavior during functional activity   OT goals addressed during session: ADL's and self-care      AM-PAC OT "6 Clicks" Daily Activity     Outcome Measure Help from another person eating meals?: A Little Help from another person taking care of personal grooming?: A Little Help from another person toileting, which includes using toliet, bedpan, or urinal?: A Lot Help from another person bathing (including washing, rinsing, drying)?: A Lot Help from another person to put on and taking off regular upper body clothing?: A Little Help from another person to put on and taking off regular lower body clothing?: A Lot 6 Click Score: 15   End of Session Equipment Utilized During Treatment: Gait belt;Rolling walker;Oxygen Nurse Communication: Mobility status  Activity Tolerance: Patient tolerated treatment well Patient left: in chair;with call bell/phone within reach;with chair alarm set  OT  Visit  Diagnosis: Unsteadiness on feet (R26.81);Other symptoms and signs involving cognitive function;History of falling (Z91.81)                Time: 7183-6725 OT Time Calculation (min): 37 min Charges:  OT General Charges $OT Visit: 1 Visit OT Evaluation $OT Eval Moderate Complexity: [place], OT Acute Rehabilitation Services Pager [phone] Office [phone]   Raymondo Band 08/16/2020, 1:31 PM

## 2020-08-16 NOTE — TOC CAGE-AID Note (Signed)
Transition of Care [facility]) - CAGE-AID Screening   Patient Details  Name: [name] MRN: [id number] Date of Birth: [birth year]  Transition of Care [facility]) CM/SW Contact:    Ella Bodo, RN Phone Number: 08/16/2020, 2:37 PM   Clinical Narrative: Pt s/p fall with SDH; pt states he has one drink a day.  He denies need for ETOH resources.    CAGE-AID Screening:    Have You Ever Felt You Ought to Cut Down on Your Drinking or Drug Use?: No Have People Annoyed You By Critizing Your Drinking Or Drug Use?: No Have You Felt Bad Or Guilty About Your Drinking Or Drug Use?: No Have You Ever Had a Drink or Used Drugs First Thing In The Morning to Steady Your Nerves or to Get Rid of a Hangover?: No CAGE-AID Score: 0  Substance Abuse Education Offered: Yes  Substance abuse interventions: Patient Counseling  Reinaldo Raddle, RN, BSN  Trauma/Neuro ICU Case Manager [phone]

## 2020-08-16 NOTE — Plan of Care (Signed)

## 2020-08-16 NOTE — Progress Notes (Signed)
No issues or problems overnight.  Patient denies headache.  No symptoms of numbness paresthesias or weakness.  Neurologically intact on examination.  Patient with small interhemispheric subdural hematoma.  I do not feel that any further imaging is necessary.  Patient may be discharged home from my standpoint when medically stable.

## 2020-08-16 NOTE — Progress Notes (Addendum)
1815 Received pt from [facility] ICU via wheelchair. Assisted to bed with min assist. Pt is A&O x4. Laceration to head with staples dry and intact. Denies pain at this time.  Pt is saying that he uses Bipap at home. I sent a message to on call Trauma team for order. CPAP/Bipap at HS ordered. RT aware.

## 2020-08-16 NOTE — Progress Notes (Signed)
Patient ID: [name], male   DOB: August 15, 1934, 86 y.o.   MRN: [id number]     Subjective: No new complaints. Working on cognitive eval with ST. ROS negative except as listed above. Objective: Vital signs in last 24 hours: Temp:  [97.5 F (36.4 C)-98.9 F (37.2 C)] 98.4 F (36.9 C) (12/08 0800) Pulse Rate:  [56-77] 62 (12/08 0600) Resp:  [15-33] 15 (12/08 0600) BP: (126-221)/(69-149) 130/69 (12/08 0600) SpO2:  [77 %-100 %] 100 % (12/08 0600) Weight:  [90.7 kg] 90.7 kg (12/07 1422) Last BM Date: 08/14/20  Intake/Output from previous day: 12/07 0701 - 12/08 0700 In: 534.4 [I.V.:534.4] Out: 1600 [Urine:1600] Intake/Output this shift: No intake/output data recorded.  General appearance: alert and cooperative Head: posterior scalp lac CDI with staples Resp: clear to auscultation bilaterally Cardio: regular rate and rhythm GI: soft, NT Extremities: calves soft Neuro: alert, F/C, MAE  Lab Results: CBC  Recent Labs    08/15/20 1416 08/16/20 0447  WBC 6.0 7.3  HGB 12.9* 13.4  HCT 40.0 41.8  PLT 141* 142*   BMET Recent Labs    08/15/20 1416 08/16/20 0447  NA 138 138  K 3.4* 3.6  CL 97* 94*  CO2 30 31  GLUCOSE 91 131*  BUN 26* 21  CREATININE 1.02 1.07  CALCIUM 9.0 8.9   PT/INR Recent Labs    08/15/20 1416  LABPROT 13.0  INR 1.0   ABG No results for input(s): PHART, HCO3 in the last 72 hours.  Invalid input(s): PCO2, PO2  Studies/Results: CT Head Wo Contrast  Result Date: 08/15/2020 CLINICAL DATA:  Fall. EXAM: CT HEAD WITHOUT CONTRAST CT CERVICAL SPINE WITHOUT CONTRAST TECHNIQUE: Multidetector CT imaging of the head and cervical spine was performed following the standard protocol without intravenous contrast. Multiplanar CT image reconstructions of the cervical spine were also generated. COMPARISON:  CT head dated January 05, 2019. FINDINGS: CT HEAD FINDINGS Brain: High left-sided parafalcine subdural hematoma measuring up to 5 mm in maximal thickness  (series 3, image 29). This tracks inferiorly along the posterior falx and tentorium (series 3, image 13). No evidence of acute infarction, intraparenchymal hemorrhage, hydrocephalus, or mass lesion/mass effect. Stable atrophy. Vascular: Calcified atherosclerosis at the skullbase. No hyperdense vessel. No hyperdense vessel. Skull: Normal. Negative for fracture or focal lesion. Sinuses/Orbits: No acute finding. Other: Small posterior scalp hematoma. CT CERVICAL SPINE FINDINGS Alignment: Normal. Skull base and vertebrae: No acute fracture. No primary bone lesion or focal pathologic process. Soft tissues and spinal canal: No prevertebral fluid or swelling. No visible canal hematoma. Disc levels: Mild disc height loss at C5-C6 and C6-C7. Relatively diffuse right greater than left facet arthropathy. Upper chest: Negative. Other: None. IMPRESSION: 1. High left-sided parafalcine subdural hematoma measuring up to 5 mm in maximal thickness, tracking inferiorly along the posterior falx and tentorium. No mass effect. 2. Small posterior scalp hematoma. 3. No acute cervical spine fracture or traumatic listhesis. Critical Value/emergent results were called by telephone at the time of interpretation on 08/15/2020 at 3:25 pm to provider [facility] , who verbally acknowledged these results. Electronically Signed   By: Titus Dubin M.D.   On: 08/15/2020 15:25   CT Cervical Spine Wo Contrast  Result Date: 08/15/2020 CLINICAL DATA:  Fall. EXAM: CT HEAD WITHOUT CONTRAST CT CERVICAL SPINE WITHOUT CONTRAST TECHNIQUE: Multidetector CT imaging of the head and cervical spine was performed following the standard protocol without intravenous contrast. Multiplanar CT image reconstructions of the cervical spine were also generated. COMPARISON:  CT  head dated January 05, 2019. FINDINGS: CT HEAD FINDINGS Brain: High left-sided parafalcine subdural hematoma measuring up to 5 mm in maximal thickness (series 3, image 29). This tracks inferiorly  along the posterior falx and tentorium (series 3, image 13). No evidence of acute infarction, intraparenchymal hemorrhage, hydrocephalus, or mass lesion/mass effect. Stable atrophy. Vascular: Calcified atherosclerosis at the skullbase. No hyperdense vessel. No hyperdense vessel. Skull: Normal. Negative for fracture or focal lesion. Sinuses/Orbits: No acute finding. Other: Small posterior scalp hematoma. CT CERVICAL SPINE FINDINGS Alignment: Normal. Skull base and vertebrae: No acute fracture. No primary bone lesion or focal pathologic process. Soft tissues and spinal canal: No prevertebral fluid or swelling. No visible canal hematoma. Disc levels: Mild disc height loss at C5-C6 and C6-C7. Relatively diffuse right greater than left facet arthropathy. Upper chest: Negative. Other: None. IMPRESSION: 1. High left-sided parafalcine subdural hematoma measuring up to 5 mm in maximal thickness, tracking inferiorly along the posterior falx and tentorium. No mass effect. 2. Small posterior scalp hematoma. 3. No acute cervical spine fracture or traumatic listhesis. Critical Value/emergent results were called by telephone at the time of interpretation on 08/15/2020 at 3:25 pm to provider [facility] , who verbally acknowledged these results. Electronically Signed   By: Titus Dubin M.D.   On: 08/15/2020 15:25   DG Chest Portable 1 View  Result Date: 08/15/2020 CLINICAL DATA:  Fall, hypoxia EXAM: PORTABLE CHEST 1 VIEW COMPARISON:  11/14/2019 chest radiograph. FINDINGS: Right rotated chest radiograph with low lung volumes. Stable cardiomediastinal silhouette with normal heart size. No pneumothorax. No pleural effusion. No pulmonary edema. Mild curvilinear right lung base opacity, favor scarring versus atelectasis. IMPRESSION: Low lung volumes with mild curvilinear right lung base opacity, favor scarring versus atelectasis. Electronically Signed   By: Ilona Sorrel M.D.   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 Annette Stable is consulting, rec no F/U CT, follow exam Scalp laceration - repaired in ED HTN - better on home lopressor and HCTZ HX A fib - now in NSR, home meds FEN - advance diet VTE - PAS Dispo - to floor, TBI team therapies. I spoke with his wife on the unit.   LOS: 1 day    Georganna Skeans, MD, MPH, FACS Trauma & General Surgery Use [email or web address] to contact on call provider  08/16/2020

## 2020-08-16 NOTE — Evaluation (Signed)
Speech Language Pathology Evaluation Patient Details Name: [name] MRN: [id number] DOB: October 18, 1933 Today's Date: 08/16/2020 Time: 5284-1324 SLP Time Calculation (min) (ACUTE ONLY): 23 min  Problem List:  Patient Active Problem List   Diagnosis Date Noted  . Subdural hematoma ([place]) 08/15/2020   Past Medical History:  Past Medical History:  Diagnosis Date  . Afib ([place])   . COPD (chronic obstructive pulmonary disease) ([place])   . Coronary artery disease   . Hypertension    Past Surgical History: History reviewed. No pertinent surgical history. HPI:  86 yo fell in [employer] parking lot when leg gave out. Suffered a parafalcine and tentorial SDH. PMH: HTN, CAD, COPD, A-fib   Assessment / Plan / Recommendation Clinical Impression  Pt exhibits a mild-moderate cognitive impairment (21/30 on SLUMA exam) with memory deficits that have been evident to a degree with wife. He "forgets how to get there" in regards to driving and wife stated "your driving days may be coming to a halt for awhile." Wife is responsible for finances, pt fixes his pill box. Pt reports compensating for memory using phone calendar and alerts. Recommend ST while in acute care and at discharge (home health?).     SLP Assessment  SLP Recommendation/Assessment: Patient needs continued Speech [place] [facility] SLP Visit Diagnosis: Cognitive communication deficit (R41.841)    Follow Up Recommendations  Home health SLP (?)    Frequency and Duration min 2x/week  2 weeks      SLP Evaluation Cognition  Overall Cognitive Status: Impaired/Different from baseline (prob close to baseline) Arousal/Alertness: Awake/alert Orientation Level: Oriented X4 Attention: Sustained Sustained Attention: Appears intact Memory: Impaired Memory Impairment: Retrieval deficit Awareness: Impaired Awareness Impairment: Anticipatory impairment Problem Solving: Impaired Problem Solving Impairment: Verbal complex Executive  Function: Organizing;Sequencing Safety/Judgment: Impaired       Comprehension  Auditory Comprehension Overall Auditory Comprehension: Appears within functional limits for tasks assessed Visual Recognition/Discrimination Discrimination: Not tested Reading Comprehension Reading Status: Not tested    Expression Expression Primary Mode of Expression: Verbal Verbal Expression Overall Verbal Expression: Appears within functional limits for tasks assessed Pragmatics: No impairment Written Expression Dominant Hand: Right Written Expression: Not tested   Oral / Motor  Oral Motor/Sensory Function Overall Oral Motor/Sensory Function: Within functional limits Motor Speech Overall Motor Speech: Appears within functional limits for tasks assessed Articulation: Within functional limitis Intelligibility: Intelligible Motor Planning: Witnin functional limits   GO                    Houston Siren 08/16/2020, 11:00 AM  Orbie Pyo Colvin Caroli.Ed Risk analyst [phone] Office [phone]

## 2020-08-16 NOTE — TOC Initial Note (Signed)
Transition of Care [facility]) - Initial/Assessment Note    Patient Details  Name: [name] MRN: [id number] Date of Birth: 15-Jun-1934  Transition of [facility]) CM/SW Contact:    Ella Bodo, RN Phone Number: 08/16/2020, 4:03 PM  Clinical Narrative: Pt is a 86 y/o male who presents after a fall while shopping at [employer]. Pt sustained a parafalcine and tentorial SDH.  Prior to admission, patient independent and living at home with spouse.  PT/OT recommending home health follow-up, and patient agreeable to services.  Wife able to provide assistance at discharge.  Will arrange home health services as ordered by provider.                 Expected Discharge Plan: [place] Barriers to Discharge: Continued Medical Work up   Patient Goals and CMS Choice Patient states their goals for this hospitalization and ongoing recovery are:: to get back home CMS Medicare.gov Compare Post Acute Care list provided to:: Patient Choice offered to / list presented to : Patient  Expected Discharge Plan and Services Expected Discharge Plan: [place]   Discharge Planning Services: CM Consult Post Acute Care Choice: [place] arrangements for the past 2 months: Single Family Home                                      Prior Living Arrangements/Services Living arrangements for the past 2 months: Single Family Home Lives with:: Spouse Patient language and need for interpreter reviewed:: Yes Do you feel safe going back to the place where you live?: Yes      Need for Family Participation in Patient Care: Yes (Comment) Care giver support system in place?: Yes (comment)   Criminal Activity/Legal Involvement Pertinent to Current Situation/Hospitalization: No - Comment as needed  Activities of Daily Living      Permission Sought/Granted                  Emotional Assessment Appearance:: Appears stated age Attitude/Demeanor/Rapport:  Engaged Affect (typically observed): Accepting Orientation: : Oriented to Self, Oriented to Place, Oriented to  Time, Oriented to Situation      Admission diagnosis:  Subdural hematoma ([place]) [S06.5X9A] Laceration of scalp, initial encounter [S01.01XA] Hypertension, unspecified type [I10] Patient Active Problem List   Diagnosis Date Noted  . Subdural hematoma ([place]) 08/15/2020   PCP:  Patient, No Pcp Per Pharmacy:   [id number] - [place], [place]. AT [address] [place] [address] Phone: [phone] Fax: [phone]     Social Determinants of Health (SDOH) Interventions    Readmission Risk Interventions No flowsheet data found.   Reinaldo Raddle, RN, BSN  Trauma/Neuro ICU Case Manager [phone]

## 2020-08-16 NOTE — Evaluation (Signed)
Physical Therapy Evaluation Patient Details Name: [name] MRN: [id number] DOB: 08-Aug-1934 Today's Date: 08/16/2020   History of Present Illness  Pt is a 86 y/o male who presents after a fall while shopping at [employer]. Pt sustained a parafalcine and tentorial SDH. PMHx includes HTN, CAD, COPD, A-fib    Clinical Impression  Pt admitted with above. Pt initially requiring assistx2 for ambulation but progressed to minAx1 with increased step length and more fluid gait pattern. Suspect pt will progress quickly. Pt aware he needs to use a RW and not his cane. Acute PT to cont to follow.    Follow Up Recommendations Home health PT;Supervision/Assistance - 24 hour    Equipment Recommendations  Rolling walker with 5" wheels (pt has one at home)    Recommendations for Other Services       Precautions / Restrictions Precautions Precautions: Fall Precaution Comments: watch HR, stays in 50s Restrictions Weight Bearing Restrictions: No      Mobility  Bed Mobility Overal bed mobility: Needs Assistance Bed Mobility: Supine to Sit     Supine to sit: Min assist     General bed mobility comments: light assist for trunk stability, use of rail and HOB slightly elevated     Transfers Overall transfer level: Needs assistance Equipment used: Rolling walker (2 wheeled) Transfers: Sit to/from Stand Sit to Stand: Min assist;+2 physical assistance;+2 safety/equipment         General transfer comment: pt unsteady with initial standing requiring assist to stabilize (+2 assist)  Ambulation/Gait Ambulation/Gait assistance: Mod assist;+2 physical assistance (transitioned to minA) Gait Distance (Feet): 120 Feet Assistive device: Rolling walker (2 wheeled) Gait Pattern/deviations: Step-through pattern;Decreased stride length;Trunk flexed Gait velocity: dec   General Gait Details: pt initially very unsteady requiring modA for safe ambulation, pt with short shuffled steps, s/p 30 feet pt  began to increased step length and was more steady, by end of amb pt minAx1 and 2nd person for line management.  Stairs            Wheelchair Mobility    Modified Rankin (Stroke Patients Only)       Balance Overall balance assessment: Needs assistance Sitting-balance support: Feet supported Sitting balance-Leahy Scale: Fair     Standing balance support: Bilateral upper extremity supported Standing balance-Leahy Scale: Poor Standing balance comment: reliant on UE support/external assist                             Pertinent Vitals/Pain Pain Assessment: 0-10 Pain Score: 7  Pain Location: headache Pain Descriptors / Indicators: Headache Pain Intervention(s): Monitored during session    Home Living Family/patient expects to be discharged to:: Private residence Living Arrangements: Spouse/significant other Available Help at Discharge: Family;Available PRN/intermittently (spouse substitute teaches ) Type of Home: House (townhouse) Home Access: Stairs to enter Entrance Stairs-Rails: Psychiatric nurse of Steps: 4 Home Layout: Two level Home Equipment: Cane - single point;Tub bench;Grab bars - toilet;Grab bars - tub/shower;Walker - 4 wheels;Cane - quad      Prior Function Level of Independence: Independent         Comments: drives, was shopping in [employer]     Hand Dominance   Dominant Hand: Right    Extremity/Trunk Assessment   Upper Extremity Assessment Upper Extremity Assessment: Generalized weakness    Lower Extremity Assessment Lower Extremity Assessment: Generalized weakness    Cervical / Trunk Assessment Cervical / Trunk Assessment: Normal  Communication   Communication: [facility]  Cognition Arousal/Alertness: Awake/alert Behavior During Therapy: WFL for tasks assessed/performed Overall Cognitive Status: Impaired/Different from baseline Area of Impairment: Attention;Memory;Following commands;Problem solving                    Current Attention Level: Selective Memory: Decreased recall of precautions Following Commands: Follows one step commands with increased time     Problem Solving: Slow processing;Requires verbal cues;Requires tactile cues General Comments: pt HOH so often requires repetition, noted some confusion with responses to basic questiosn (when asking to rate pain levels); likely will benefit from further assessment       General Comments General comments (skin integrity, edema, etc.): HR stays in 50, otherwise VSS    Exercises     Assessment/Plan    PT Assessment Patient needs continued PT services  PT Problem List Decreased strength;Decreased activity tolerance;Decreased balance;Decreased mobility       PT Treatment Interventions DME instruction;Gait training;Stair training;Functional mobility training;Therapeutic activities;Therapeutic exercise;Balance training    PT Goals (Current goals can be found in the Care Plan section)  Acute Rehab PT Goals Patient Stated Goal: home PT Goal Formulation: With patient Time For Goal Achievement: 08/30/20 Potential to Achieve Goals: Good    Frequency Min 4X/week   Barriers to discharge        Co-evaluation PT/OT/SLP Co-Evaluation/Treatment: Yes Reason for Co-Treatment: For patient/therapist safety PT goals addressed during session: Mobility/safety with mobility OT goals addressed during session: ADL's and self-care       AM-PAC PT "6 Clicks" Mobility  Outcome Measure Help needed turning from your back to your side while in a flat bed without using bedrails?: A Little Help needed moving from lying on your back to sitting on the side of a flat bed without using bedrails?: A Little Help needed moving to and from a bed to a chair (including a wheelchair)?: A Little Help needed standing up from a chair using your arms (e.g., wheelchair or bedside chair)?: A Little Help needed to walk in hospital room?: A Little Help needed  climbing 3-5 steps with a railing? : A Lot 6 Click Score: 17    End of Session Equipment Utilized During Treatment: Gait belt;Oxygen Activity Tolerance: Patient tolerated treatment well Patient left: in chair;with call bell/phone within reach;with chair alarm set Nurse Communication: Mobility status PT Visit Diagnosis: Unsteadiness on feet (R26.81);Muscle weakness (generalized) (M62.81);Difficulty in walking, not elsewhere classified (R26.2)    Time: 0301-3143 PT Time Calculation (min) (ACUTE ONLY): 31 min   Charges:   PT Evaluation $PT Eval Moderate Complexity: 1 Mod          Kittie Plater, PT, DPT Acute Rehabilitation Services Pager #: [phone] Office #: [phone]   [place] Lopes 08/16/2020, 2:29 PM

## 2020-08-17 MED ORDER — METHOCARBAMOL 500 MG PO TABS: 500.0000 mg | ORAL_TABLET | Freq: Three times a day (TID) | ORAL | Status: DC | PRN

## 2020-08-17 MED ORDER — ACETAMINOPHEN 325 MG PO TABS: 650.0000 mg | ORAL_TABLET | Freq: Four times a day (QID) | ORAL | Status: AC | PRN

## 2020-08-17 MED ORDER — TRAMADOL HCL 50 MG PO TABS: 50.0000 mg | ORAL_TABLET | Freq: Four times a day (QID) | ORAL | Status: DC | PRN

## 2020-08-17 MED ORDER — MORPHINE SULFATE (PF) 2 MG/ML IV SOLN: 2.0000 mg | Freq: Four times a day (QID) | INTRAVENOUS | Status: DC | PRN

## 2020-08-17 MED ORDER — LEVETIRACETAM 500 MG PO TABS: 500.0000 mg | ORAL_TABLET | Freq: Two times a day (BID) | ORAL | 0 refills | Status: DC

## 2020-08-17 MED ORDER — HYDRALAZINE HCL 20 MG/ML IJ SOLN
5.0000 mg | INTRAMUSCULAR | Status: DC | PRN
  Administered 2020-08-17: 5 mg via INTRAVENOUS
  Filled 2020-08-17: qty 1

## 2020-08-17 NOTE — Plan of Care (Signed)
Problem: SLP Cognition Goals Goal: Patient will utilize external memory aids Description: Patient will utilize external memory aids to facilitate recall of information for improved safety with Outcome: Adequate for Discharge Goal: Patient will demonstrate problem solving skills Description: Patient will demonstrate problem solving skills during functional ADL's with Outcome: Adequate for Discharge Goal: Patient will demonstrate awareness during Description: Patient will demonstrate awareness during functional ADL for improved safety Outcome: Adequate for Discharge   Problem: Acute Rehab PT Goals(only PT should resolve) Goal: Pt Will Go Supine/Side To Sit Outcome: Adequate for Discharge Goal: Patient Will Transfer Sit To/From Stand Outcome: Adequate for Discharge Goal: Pt Will Transfer Bed To Chair/Chair To Bed Outcome: Adequate for Discharge Goal: Pt Will Ambulate Outcome: Adequate for Discharge Goal: Pt Will Go Up/Down Stairs Outcome: Adequate for Discharge   Problem: Acute Rehab OT Goals (only OT should resolve) Goal: Pt. Will Perform Grooming Outcome: Adequate for Discharge Goal: Pt. Will Perform Lower Body Bathing Outcome: Adequate for Discharge Goal: Pt. Will Perform Lower Body Dressing Outcome: Adequate for Discharge Goal: Pt. Will Transfer To Toilet Outcome: Adequate for Discharge Goal: Pt. Will Perform Toileting-Clothing Manipulation Outcome: Adequate for Discharge Goal: OT Additional ADL Goal #1 Outcome: Adequate for Discharge   Problem: Acute Rehab OT Goals (only OT should resolve) Goal: Pt. Will Perform Tub/Shower Transfer Outcome: Adequate for Discharge   Problem: Education: Goal: Knowledge of General Education information will improve Description: Including pain rating scale, medication(s)/side effects and non-pharmacologic comfort measures 08/17/2020 1833 by Phebe Colla, RN Outcome: Adequate for Discharge 08/17/2020 1833 by Phebe Colla,  RN Outcome: Adequate for Discharge   Problem: Health Behavior/Discharge Planning: Goal: Ability to manage health-related needs will improve 08/17/2020 1833 by Phebe Colla, RN Outcome: Adequate for Discharge 08/17/2020 1833 by Phebe Colla, RN Outcome: Adequate for Discharge   Problem: Clinical Measurements: Goal: Ability to maintain clinical measurements within normal limits will improve 08/17/2020 1833 by Phebe Colla, RN Outcome: Adequate for Discharge 08/17/2020 1833 by Phebe Colla, RN Outcome: Adequate for Discharge Goal: Will remain free from infection 08/17/2020 1833 by Phebe Colla, RN Outcome: Adequate for Discharge 08/17/2020 1833 by Phebe Colla, RN Outcome: Adequate for Discharge Goal: Diagnostic test results will improve 08/17/2020 1833 by Phebe Colla, RN Outcome: Adequate for Discharge 08/17/2020 1833 by Phebe Colla, RN Outcome: Adequate for Discharge Goal: Respiratory complications will improve 08/17/2020 1833 by Phebe Colla, RN Outcome: Adequate for Discharge 08/17/2020 1833 by Phebe Colla, RN Outcome: Adequate for Discharge Goal: Cardiovascular complication will be avoided 08/17/2020 1833 by Phebe Colla, RN Outcome: Adequate for Discharge 08/17/2020 1833 by Phebe Colla, RN Outcome: Adequate for Discharge   Problem: Activity: Goal: Risk for activity intolerance will decrease 08/17/2020 1833 by Phebe Colla, RN Outcome: Adequate for Discharge 08/17/2020 1833 by Phebe Colla, RN Outcome: Adequate for Discharge   Problem: Nutrition: Goal: Adequate nutrition will be maintained 08/17/2020 1833 by Phebe Colla, RN Outcome: Adequate for Discharge 08/17/2020 1833 by Phebe Colla, RN Outcome: Adequate for Discharge   Problem: Coping: Goal: Level of anxiety will decrease 08/17/2020 1833 by Phebe Colla, RN Outcome: Adequate for Discharge 08/17/2020  1833 by Phebe Colla, RN Outcome: Adequate for Discharge   Problem: Elimination: Goal: Will not experience complications related to bowel motility 08/17/2020 1833 by Phebe Colla, RN Outcome: Adequate for Discharge 08/17/2020 1833 by Phebe Colla, RN Outcome: Adequate for Discharge Goal: Will not experience complications related  to urinary retention 08/17/2020 1833 by Phebe Colla, RN Outcome: Adequate for Discharge 08/17/2020 1833 by Phebe Colla, RN Outcome: Adequate for Discharge   Problem: Pain Managment: Goal: General experience of comfort will improve 08/17/2020 1833 by Phebe Colla, RN Outcome: Adequate for Discharge 08/17/2020 1833 by Phebe Colla, RN Outcome: Adequate for Discharge   Problem: Safety: Goal: Ability to remain free from injury will improve 08/17/2020 1833 by Phebe Colla, RN Outcome: Adequate for Discharge 08/17/2020 1833 by Phebe Colla, RN Outcome: Adequate for Discharge   Problem: Skin Integrity: Goal: Risk for impaired skin integrity will decrease 08/17/2020 1833 by Phebe Colla, RN Outcome: Adequate for Discharge 08/17/2020 1833 by Phebe Colla, RN Outcome: Adequate for Discharge

## 2020-08-17 NOTE — TOC Transition Note (Signed)
Transition of Care [facility]) - CM/SW Discharge Note   Patient Details  Name: [name] MRN: [id number] Date of Birth: 06-20-1934  [facility]) CM/SW Contact:  Ella Bodo, RN Phone Number: 08/17/2020, 4:18 PM   Clinical Narrative: Pt medically stable for discharge home today with family.  PT/OT/ST recommending [place] follow up; unfortunately unable to secure [facility] follow up due to staffing and insurance issues.  Pt/daughter agreeable to OP therapy follow up at [facility]; daughter states she and pt's wife can provide transport to appts.  Referrals made in Epic.    Final next level of care: OP Rehab Barriers to Discharge: Barriers Resolved   Patient Goals and CMS Choice Patient states their goals for this hospitalization and ongoing recovery are:: to get back home CMS Medicare.gov Compare Post Acute Care list provided to:: Patient Choice offered to / list presented to : Patient                        Discharge Plan and Services   Discharge Planning Services: CM Consult Post Acute Care Choice: Home Health                               Social Determinants of Health (SDOH) Interventions     Readmission Risk Interventions No flowsheet data found.  Reinaldo Raddle, RN, BSN  Trauma/Neuro ICU Case Manager [phone]

## 2020-08-17 NOTE — Progress Notes (Signed)
12/8 1000 Pt refused SCDs. Pt educated on the importance of the SCDs.

## 2020-08-17 NOTE — Progress Notes (Signed)
  Speech Language Pathology Treatment: Cognitive-Linquistic  Patient Details Name: [name] MRN: [id number] DOB: 1934/09/04 Today's Date: 08/17/2020 Time: 1010-1025 SLP Time Calculation (min) (ACUTE ONLY): 15 min  Assessment / Plan / Recommendation Clinical Impression  SLP followed up for education on compensatory strategies to improve recall, safety awareness, and problem solving. Pt able to recall recommendations for utilizing rolling walker per PT recs however requiring reinforcement not to get up alone. Discussed strategies for improved recall including visual aids, memory log. Pt agreeable to follow up [facility] services for improved cognitive abilities. Further needs can be addressed at next level of care with [facility] SLP.    HPI HPI: 86 yo fell in [employer] parking lot when leg gave out. Suffered a parafalcine and tentorial SDH. PMH: HTN, CAD, COPD, A-fib      SLP Plan  Other (Comment) (all further needs can be addressed at next level of care)       Recommendations   Home health speech therapy to improve cognitive functioning                Follow up Recommendations: Home health SLP SLP Visit Diagnosis: Cognitive communication deficit (R41.841) Plan: Other (Comment) (all further needs can be addressed at next level of care)       [place] MA, [place]  08/17/2020, 10:41 AM

## 2020-08-17 NOTE — Progress Notes (Signed)
Pt was given the AVS discharge summary and went over with him. IV was removed with catheter intact. Pt had no further questions. Daughter is taking pt home.

## 2020-08-17 NOTE — Progress Notes (Signed)
[facility] Progress Note     Subjective: CC-  Daughter at bedside. Up in chair this morning. Did not sleep well last night due to being in the hospital. He reports only a mild headache, not taking any pain medication. Tolerating diet. Denies n/v.  Objective: Vital signs in last 24 hours: Temp:  [98 F (36.7 C)-98.5 F (36.9 C)] 98.5 F (36.9 C) (12/08 2029) Pulse Rate:  [47-72] 56 (12/09 0410) Resp:  [16-26] 16 (12/09 0410) BP: (91-171)/(48-91) 162/88 (12/09 0410) SpO2:  [88 %-100 %] 92 % (12/09 0410) FiO2 (%):  [28 %] 28 % (12/08 0944) Last BM Date: 08/17/20  Intake/Output from previous day: 12/08 0701 - 12/09 0700 In: 1005 [I.V.:1005] Out: 1550 [Urine:1550] Intake/Output this shift: No intake/output data recorded.  PE: Gen:  Alert, NAD, pleasant HEENT: EOM's intact, pupils equal and round, posterior scalp lac s/p repair with staples intact and no erythema or drainage Card:  Irregularly irregular, no M/G/R heard, 2+ DP pulses Pulm:  CTAB, no W/R/R, rate and effort normal Abd: Soft, NT/ND, +BS Ext:  no BUE/BLE edema, calves soft and nontender Neuro: f/c, MAE Psych: A&Ox4  Skin: no rashes noted, warm and dry  Lab Results:  Recent Labs    08/15/20 1416 08/16/20 0447  WBC 6.0 7.3  HGB 12.9* 13.4  HCT 40.0 41.8  PLT 141* 142*   BMET Recent Labs    08/15/20 1416 08/16/20 0447  NA 138 138  K 3.4* 3.6  CL 97* 94*  CO2 30 31  GLUCOSE 91 131*  BUN 26* 21  CREATININE 1.02 1.07  CALCIUM 9.0 8.9   PT/INR Recent Labs    08/15/20 1416  LABPROT 13.0  INR 1.0   CMP     Component Value Date/Time   NA 138 08/16/2020 0447   K 3.6 08/16/2020 0447   CL 94 (L) 08/16/2020 0447   CO2 31 08/16/2020 0447   GLUCOSE 131 (H) 08/16/2020 0447   BUN 21 08/16/2020 0447   CREATININE 1.07 08/16/2020 0447   CALCIUM 8.9 08/16/2020 0447   GFRNONAA >60 08/16/2020 0447   Lipase  No results found for: LIPASE     Studies/Results: CT Head Wo  Contrast  Result Date: 08/15/2020 CLINICAL DATA:  Fall. EXAM: CT HEAD WITHOUT CONTRAST CT CERVICAL SPINE WITHOUT CONTRAST TECHNIQUE: Multidetector CT imaging of the head and cervical spine was performed following the standard protocol without intravenous contrast. Multiplanar CT image reconstructions of the cervical spine were also generated. COMPARISON:  CT head dated January 05, 2019. FINDINGS: CT HEAD FINDINGS Brain: High left-sided parafalcine subdural hematoma measuring up to 5 mm in maximal thickness (series 3, image 29). This tracks inferiorly along the posterior falx and tentorium (series 3, image 13). No evidence of acute infarction, intraparenchymal hemorrhage, hydrocephalus, or mass lesion/mass effect. Stable atrophy. Vascular: Calcified atherosclerosis at the skullbase. No hyperdense vessel. No hyperdense vessel. Skull: Normal. Negative for fracture or focal lesion. Sinuses/Orbits: No acute finding. Other: Small posterior scalp hematoma. CT CERVICAL SPINE FINDINGS Alignment: Normal. Skull base and vertebrae: No acute fracture. No primary bone lesion or focal pathologic process. Soft tissues and spinal canal: No prevertebral fluid or swelling. No visible canal hematoma. Disc levels: Mild disc height loss at C5-C6 and C6-C7. Relatively diffuse right greater than left facet arthropathy. Upper chest: Negative. Other: None. IMPRESSION: 1. High left-sided parafalcine subdural hematoma measuring up to 5 mm in maximal thickness, tracking inferiorly along the posterior falx and tentorium. No mass effect. 2. Small posterior  scalp hematoma. 3. No acute cervical spine fracture or traumatic listhesis. Critical Value/emergent results were called by telephone at the time of interpretation on 08/15/2020 at 3:25 pm to provider [facility] , who verbally acknowledged these results. Electronically Signed   By: Titus Dubin M.D.   On: 08/15/2020 15:25   CT Cervical Spine Wo Contrast  Result Date: 08/15/2020 CLINICAL  DATA:  Fall. EXAM: CT HEAD WITHOUT CONTRAST CT CERVICAL SPINE WITHOUT CONTRAST TECHNIQUE: Multidetector CT imaging of the head and cervical spine was performed following the standard protocol without intravenous contrast. Multiplanar CT image reconstructions of the cervical spine were also generated. COMPARISON:  CT head dated January 05, 2019. FINDINGS: CT HEAD FINDINGS Brain: High left-sided parafalcine subdural hematoma measuring up to 5 mm in maximal thickness (series 3, image 29). This tracks inferiorly along the posterior falx and tentorium (series 3, image 13). No evidence of acute infarction, intraparenchymal hemorrhage, hydrocephalus, or mass lesion/mass effect. Stable atrophy. Vascular: Calcified atherosclerosis at the skullbase. No hyperdense vessel. No hyperdense vessel. Skull: Normal. Negative for fracture or focal lesion. Sinuses/Orbits: No acute finding. Other: Small posterior scalp hematoma. CT CERVICAL SPINE FINDINGS Alignment: Normal. Skull base and vertebrae: No acute fracture. No primary bone lesion or focal pathologic process. Soft tissues and spinal canal: No prevertebral fluid or swelling. No visible canal hematoma. Disc levels: Mild disc height loss at C5-C6 and C6-C7. Relatively diffuse right greater than left facet arthropathy. Upper chest: Negative. Other: None. IMPRESSION: 1. High left-sided parafalcine subdural hematoma measuring up to 5 mm in maximal thickness, tracking inferiorly along the posterior falx and tentorium. No mass effect. 2. Small posterior scalp hematoma. 3. No acute cervical spine fracture or traumatic listhesis. Critical Value/emergent results were called by telephone at the time of interpretation on 08/15/2020 at 3:25 pm to provider [facility] , who verbally acknowledged these results. Electronically Signed   By: Titus Dubin M.D.   On: 08/15/2020 15:25   DG Chest Portable 1 View  Result Date: 08/15/2020 CLINICAL DATA:  Fall, hypoxia EXAM: PORTABLE CHEST 1 VIEW  COMPARISON:  11/14/2019 chest radiograph. FINDINGS: Right rotated chest radiograph with low lung volumes. Stable cardiomediastinal silhouette with normal heart size. No pneumothorax. No pleural effusion. No pulmonary edema. Mild curvilinear right lung base opacity, favor scarring versus atelectasis. IMPRESSION: Low lung volumes with mild curvilinear right lung base opacity, favor scarring versus atelectasis. Electronically Signed   By: Ilona Sorrel M.D.   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 Annette Stable is consulting, rec no F/U CT, follow exam Scalp laceration - repaired in ED with staples HTN - better on home lopressor and HCTZ. IV hydralazine PRN.  HX A fib - home meds FEN - KVO IVF, reg diet VTE - PAS only for now, mobilize Follow up - PCP Dispo - Continue TBI team therapies. Possible discharge later today. Home health therapies have been ordered.   LOS: 2 days    Wellington Hampshire, [facility] 08/17/2020, 7:57 AM Please see Amion for pager number during day hours 7:00am-4:30pm

## 2020-08-17 NOTE — Progress Notes (Signed)
Providing Compassionate, Quality Care - Together   Subjective: Patient reports no issues overnight. He is ready to go home.  Objective: Vital signs in last 24 hours: Temp:  [97.7 F (36.5 C)-98.5 F (36.9 C)] 97.7 F (36.5 C) (12/09 0900) Pulse Rate:  [47-72] 60 (12/09 0900) Resp:  [16-26] 17 (12/09 0900) BP: (91-195)/(48-92) 195/92 (12/09 0900) SpO2:  [88 %-98 %] 96 % (12/09 0900)  Intake/Output from previous day: 12/08 0701 - 12/09 0700 In: 1005 [I.V.:1005] Out: 1550 [Urine:1550] Intake/Output this shift: No intake/output data recorded.  Alert and oriented x 4 PERRLA Speech clear CN II-XII grossly intact MAE, Strength and sensation intact   Lab Results: Recent Labs    08/15/20 1416 08/16/20 0447  WBC 6.0 7.3  HGB 12.9* 13.4  HCT 40.0 41.8  PLT 141* 142*   BMET Recent Labs    08/15/20 1416 08/16/20 0447  NA 138 138  K 3.4* 3.6  CL 97* 94*  CO2 30 31  GLUCOSE 91 131*  BUN 26* 21  CREATININE 1.02 1.07  CALCIUM 9.0 8.9    Studies/Results: CT Head Wo Contrast  Result Date: 08/15/2020 CLINICAL DATA:  Fall. EXAM: CT HEAD WITHOUT CONTRAST CT CERVICAL SPINE WITHOUT CONTRAST TECHNIQUE: Multidetector CT imaging of the head and cervical spine was performed following the standard protocol without intravenous contrast. Multiplanar CT image reconstructions of the cervical spine were also generated. COMPARISON:  CT head dated January 05, 2019. FINDINGS: CT HEAD FINDINGS Brain: High left-sided parafalcine subdural hematoma measuring up to 5 mm in maximal thickness (series 3, image 29). This tracks inferiorly along the posterior falx and tentorium (series 3, image 13). No evidence of acute infarction, intraparenchymal hemorrhage, hydrocephalus, or mass lesion/mass effect. Stable atrophy. Vascular: Calcified atherosclerosis at the skullbase. No hyperdense vessel. No hyperdense vessel. Skull: Normal. Negative for fracture or focal lesion. Sinuses/Orbits: No acute finding.  Other: Small posterior scalp hematoma. CT CERVICAL SPINE FINDINGS Alignment: Normal. Skull base and vertebrae: No acute fracture. No primary bone lesion or focal pathologic process. Soft tissues and spinal canal: No prevertebral fluid or swelling. No visible canal hematoma. Disc levels: Mild disc height loss at C5-C6 and C6-C7. Relatively diffuse right greater than left facet arthropathy. Upper chest: Negative. Other: None. IMPRESSION: 1. High left-sided parafalcine subdural hematoma measuring up to 5 mm in maximal thickness, tracking inferiorly along the posterior falx and tentorium. No mass effect. 2. Small posterior scalp hematoma. 3. No acute cervical spine fracture or traumatic listhesis. Critical Value/emergent results were called by telephone at the time of interpretation on 08/15/2020 at 3:25 pm to provider [facility] , who verbally acknowledged these results. Electronically Signed   By: Titus Dubin M.D.   On: 08/15/2020 15:25   CT Cervical Spine Wo Contrast  Result Date: 08/15/2020 CLINICAL DATA:  Fall. EXAM: CT HEAD WITHOUT CONTRAST CT CERVICAL SPINE WITHOUT CONTRAST TECHNIQUE: Multidetector CT imaging of the head and cervical spine was performed following the standard protocol without intravenous contrast. Multiplanar CT image reconstructions of the cervical spine were also generated. COMPARISON:  CT head dated January 05, 2019. FINDINGS: CT HEAD FINDINGS Brain: High left-sided parafalcine subdural hematoma measuring up to 5 mm in maximal thickness (series 3, image 29). This tracks inferiorly along the posterior falx and tentorium (series 3, image 13). No evidence of acute infarction, intraparenchymal hemorrhage, hydrocephalus, or mass lesion/mass effect. Stable atrophy. Vascular: Calcified atherosclerosis at the skullbase. No hyperdense vessel. No hyperdense vessel. Skull: Normal. Negative for fracture or focal lesion. Sinuses/Orbits:  No acute finding. Other: Small posterior scalp hematoma. CT  CERVICAL SPINE FINDINGS Alignment: Normal. Skull base and vertebrae: No acute fracture. No primary bone lesion or focal pathologic process. Soft tissues and spinal canal: No prevertebral fluid or swelling. No visible canal hematoma. Disc levels: Mild disc height loss at C5-C6 and C6-C7. Relatively diffuse right greater than left facet arthropathy. Upper chest: Negative. Other: None. IMPRESSION: 1. High left-sided parafalcine subdural hematoma measuring up to 5 mm in maximal thickness, tracking inferiorly along the posterior falx and tentorium. No mass effect. 2. Small posterior scalp hematoma. 3. No acute cervical spine fracture or traumatic listhesis. Critical Value/emergent results were called by telephone at the time of interpretation on 08/15/2020 at 3:25 pm to provider [facility] , who verbally acknowledged these results. Electronically Signed   By: Titus Dubin M.D.   On: 08/15/2020 15:25   DG Chest Portable 1 View  Result Date: 08/15/2020 CLINICAL DATA:  Fall, hypoxia EXAM: PORTABLE CHEST 1 VIEW COMPARISON:  11/14/2019 chest radiograph. FINDINGS: Right rotated chest radiograph with low lung volumes. Stable cardiomediastinal silhouette with normal heart size. No pneumothorax. No pleural effusion. No pulmonary edema. Mild curvilinear right lung base opacity, favor scarring versus atelectasis. IMPRESSION: Low lung volumes with mild curvilinear right lung base opacity, favor scarring versus atelectasis. Electronically Signed   By: Ilona Sorrel M.D.   On: 08/15/2020 15:54    Assessment/Plan: Patient sustained a left-side parafalcine SDH on 08/15/2020 following a fall from ground level. He has no focal deficits.   LOS: 2 days    -Patient ready for discharge home -No further recommendations from Neurosurgery   Viona Gilmore, [place], AGNP-C Nurse Practitioner  [facility]. [address], [place], [place], [place] [address] P: [phone]    F:  [phone]  08/17/2020, 11:00 AM

## 2020-08-17 NOTE — Discharge Instructions (Addendum)
Follow up with your primary care physician around 08/25/20 (give or take a few days) to have staples removed from your scalp. Recommend monitoring and keeping a log of your blood pressure to discuss with your primary care doctor at your next appointment.   Subdural Hematoma  A subdural hematoma is a collection of blood between the brain and its outer covering (dura). As the amount of blood increases, pressure builds on the brain. There are two types of subdural hematomas:  Acute. This type develops shortly after a hard, direct hit to the head and causes blood to collect very quickly. This is a medical emergency. If it is not diagnosed and treated quickly, it can lead to severe brain injury or death.  Chronic. This is when bleeding develops more slowly, over weeks or months. In some cases, this type does not cause symptoms. What are the causes? This condition is caused by bleeding (hemorrhage) from a broken (ruptured) blood vessel. In most cases, a blood vessel ruptures and bleeds because of a head injury, such as from a hard, direct hit. Head injuries can happen in car accidents, falls, assaults, or while playing sports. In rare cases, a hemorrhage can happen without a known cause (spontaneously), especially if you take blood thinners (anticoagulants). What increases the risk? This condition is more likely to develop in:  Older people.  Infants.  People who take blood thinners.  People who have head injuries.  People who abuse alcohol. What are the signs or symptoms? Symptoms of this condition can vary depending on the size of the hematoma. Symptoms can be mild, severe, or life-threatening. They include:  Headaches.  Nausea or vomiting.  Changes in vision, such as double vision or loss of vision.  Changes in speech or trouble understanding what people say.  Loss of balance or trouble walking.  Weakness, numbness, or tingling in the arms or legs, especially on one side of the  body.  Seizures.  Change in personality.  Increased sleepiness.  Memory loss.  Loss of consciousness.  Coma. Symptoms of acute subdural hematoma can develop over minutes or hours. Symptoms of chronic subdural hematoma may develop over weeks or months. How is this diagnosed? This condition is diagnosed based on the results of:  A physical exam.  Tests of strength, reflexes, coordination, senses, manner of walking (gait), and facial and eye movements (neurological exam).  Imaging tests, such as an MRI or a CT scan. How is this treated? Treatment for this condition depends on the type of hematoma and how severe it is. Treatment for acute hematoma may include:  Emergency surgery to drain blood or remove a blood clot.  Medicines that help the body get rid of excess fluids (diuretics). These may help to reduce pressure in the brain.  Assisted breathing (ventilation). Treatment for chronic hematoma may include:  Observation and bed rest at the hospital.  Surgery. If you take blood thinners, you may need to stop taking them for a short time. You may also be given anti-seizure (anticonvulsant) medicine. Sometimes, no treatment is needed for chronic subdural hematoma. Follow these instructions at home: Activity  Avoid situations where you could injure your head again, such as in competitive sports, downhill snow sports, and horseback riding. Do not do these activities until your health care provider approves. ? Wear protective gear, such as a helmet, when participating in activities such as biking or contact sports.  Avoid too much visual stimulation while recovering. This means limiting how much you read and  limiting your screen time on a smart phone, tablet, computer, or TV.  Rest as told by your health care provider. Rest helps the brain heal.  Try to avoid activities that cause physical or mental stress. Return to work or school as told by your health care provider.  Do  not lift anything that is heavier than 5 lb (2.3 kg), or the limit you are told, until your health care provider says that it is safe.  Do not drive, ride a bike, or use heavy machinery until your health care provider approves.  Always wear your seat belt when you are in a motor vehicle. Alcohol use  Do not drink alcohol if your health care provider tells you not to drink.  If you drink alcohol, limit how much you use to: ? 0-1 drink a day for women. ? 0-2 drinks a day for men. General instructions  Monitor your symptoms, and ask people around you to do the same. Recovery from brain injuries varies. Talk with your health care provider about what to expect.  Take over-the-counter and prescription medicines only as told by your health care provider. Do not take blood thinners or NSAIDs unless your health care provider approves. These include aspirin, ibuprofen, naproxen, and warfarin.  Keep your home environment safe to reduce the risk of falling.  Keep all follow-up visits as told by your health care provider. This is important. Where to find more information  [employer] of Neurological Disorders and Stroke: MasterBoxes.it  American Academy of Neurology (AAN): http://keith.biz/  Brain Injury Association of [place]: www.biausa.org Get help right away if you:  Are taking blood thinners and you fall or you experience minor trauma to the head. If you take any blood thinners, even a very small injury can cause a subdural hematoma.  Have a bleeding disorder and you fall or you experience minor trauma to the head.  Develop any of the following symptoms after a head injury: ? Clear fluid draining from your nose or ears. ? Nausea or vomiting. ? Changes in speech or trouble understanding what people say. ? Seizures. ? Drowsiness or a decrease in alertness. ? Double vision. ? Numbness or inability to move (paralysis) in any part of your body. ? Difficulty walking or poor  coordination. ? Difficulty thinking. ? Confusion or forgetfulness. ? Personality changes. ? Irrational or aggressive behavior. These symptoms may represent a serious problem that is an emergency. Do not wait to see if the symptoms will go away. Get medical help right away. Call your local emergency services (911 in the [place] Do not drive yourself to the hospital. Summary  A subdural hematoma is a collection of blood between the brain and its outer covering (dura).  Treatment for this condition depends on what type of subdural hematoma you have and how severe it is.  Symptoms can vary from mild to severe to life-threatening.  Monitor your symptoms, and ask others around you to do the same. This information is not intended to replace advice given to you by your health care provider. Make sure you discuss any questions you have with your health care provider. Document Revised: 07/27/2018 Document Reviewed: 07/27/2018 Elsevier Patient Education  2020 [employer].

## 2020-08-17 NOTE — Discharge Summary (Signed)
[facility] Discharge Summary   Patient ID: [name] MRN: [id number] DOB/AGE: [id number] 86 y.o.  Admit date: 08/15/2020 Discharge date: 08/17/2020  Admitting Diagnosis: Fall TBI with parafalcine and tentorial SDH Scalp laceration HTN A fib  Discharge Diagnosis Same  Consultants Neurosurgery  Imaging: DG Chest Portable 1 View  Result Date: 08/15/2020 CLINICAL DATA:  Fall, hypoxia EXAM: PORTABLE CHEST 1 VIEW COMPARISON:  11/14/2019 chest radiograph. FINDINGS: Right rotated chest radiograph with low lung volumes. Stable cardiomediastinal silhouette with normal heart size. No pneumothorax. No pleural effusion. No pulmonary edema. Mild curvilinear right lung base opacity, favor scarring versus atelectasis. IMPRESSION: Low lung volumes with mild curvilinear right lung base opacity, favor scarring versus atelectasis. Electronically Signed   By: Ilona Sorrel M.D.   On: 08/15/2020 15:54    Procedures Dr. Tomi Bamberger (08/15/2020) - Scalp laceration repair  Hospital Course:  [name] is an 86yo male PMH HTN and A fib who presented to [facility] after suffering a ground level fall. Patient was shopping at [employer] when he reports his leg gave out and he fell. He struck the back of his head and sustained a laceration. No LOC. He was transported by EMS for evaluation in the ED. He was a level 2 trauma because he reported he takes anticoagulation for afib. Looking at his cardiology notes, they have stopped his anticoagulation. Work-up showed a parafalcine and tentorial SDH. He is also hypertensive. Trauma was asked to admit. He C/O a mild HA.  Scalp laceration was repaired in the ED. Neurosurgery was consulted for TBI/SDH and recommended supportive care. SDH was small and patient did not require any follow up imaging. Monitored clinically and remained stable from neurological standpoint. Patient worked with TBI team therapies during this admission. On 12/9 the patient was voiding well,  tolerating diet, ambulating well, pain well controlled, vital signs stable and felt stable for discharge home with plans for home health vs outpatient therapies.  Patient will follow up as below and knows to call with questions or concerns.    I have personally reviewed the patients medication history on the [place] controlled substance database.     Allergies as of 08/17/2020   No Known Allergies     Medication List    TAKE these medications   acetaminophen 325 MG tablet Commonly known as: TYLENOL Take 2 tablets (650 mg total) by mouth every 6 (six) hours as needed for mild pain.   allopurinol 300 MG tablet Commonly known as: ZYLOPRIM Take 300 mg by mouth daily.   Anoro Ellipta 62.5-25 MCG/INH Aepb Generic drug: umeclidinium-vilanterol Inhale 1 puff into the lungs daily.   B-12 Microlozenge 500 MCG Subl Generic drug: Cyanocobalamin Place 1 tablet under the tongue daily.   buPROPion 100 MG 12 hr tablet Commonly known as: WELLBUTRIN SR Take 100 mg by mouth 2 (two) times daily.   ferrous sulfate 324 MG Tbec Take 324 mg by mouth daily with breakfast.   hydrochlorothiazide 25 MG tablet Commonly known as: HYDRODIURIL Take 25 mg by mouth daily.   levETIRAcetam 500 MG tablet Commonly known as: Keppra Take 1 tablet (500 mg total) by mouth 2 (two) times daily for 7 days.   lovastatin 40 MG tablet Commonly known as: MEVACOR Take 40 mg by mouth daily.   metoprolol tartrate 25 MG tablet Commonly known as: LOPRESSOR Take 25 mg by mouth 2 (two) times daily.   pantoprazole 40 MG tablet Commonly known as: PROTONIX Take 40 mg by mouth daily.  sertraline 100 MG tablet Commonly known as: ZOLOFT Take 100 mg by mouth daily.   traZODone 100 MG tablet Commonly known as: DESYREL Take 100 mg by mouth at bedtime.         Follow-up Information    Dr. Sharlett Iles. Schedule an appointment as soon as possible for a visit.   Why: Call to arrange post-hospitalization follow up with  your primary care physician. You need to have the staples removed from your scalp around 08/25/20       Earnie Larsson, MD. Call.   Specialty: Neurosurgery Why: as needed regarding head injury Contact information: [address] [place] [address] [phone]               Signed: Wellington Hampshire, [facility] 08/17/2020, 3:27 PM Please see Amion for pager number during day hours 7:00am-4:30pm

## 2020-08-17 NOTE — Progress Notes (Signed)
Physical Therapy Treatment Patient Details Name: [name] MRN: [id number] DOB: Mar 20, 1934 Today's Date: 08/17/2020    History of Present Illness Pt is a 86 y/o male who presents after a fall while shopping at [employer]. Pt sustained a parafalcine and tentorial SDH. PMHx includes HTN, CAD, COPD, A-fib    PT Comments    Pt up in chair on arrival, agreeable to therapy session and with good participation and tolerance for mobility. Primary session focus on progressing safety with transfers, gait and stair training. Pt performed 4 steps with B rails and min guard at most, no LOB. Pt performed gait trial with improved stability this session, ~172ft using RW and min guard assist. Pt continues to benefit from PT services to progress toward functional mobility goals. Pt has some decreased safety awareness and will need strict 24/7 supervision/assist for safety upon discharge home.    Follow Up Recommendations  Home health PT;Supervision/Assistance - 24 hour     Equipment Recommendations  Rolling walker with 5" wheels (pt reports he has RW at home)    Recommendations for Other Services       Precautions / Restrictions Precautions Precautions: Fall Precaution Comments: watch HR, stays in 50s Restrictions Weight Bearing Restrictions: No    Mobility  Bed Mobility               General bed mobility comments: sitting up in chair pre/post session  Transfers Overall transfer level: Needs assistance Equipment used: Rolling walker (2 wheeled) Transfers: Sit to/from Stand Sit to Stand: Supervision Stand pivot transfers: Supervision       General transfer comment: Supervision for simple transfers chair<>RW. Benefits from UE support but no overt LOB noted.  Ambulation/Gait Ambulation/Gait assistance: Min guard;+2 safety/equipment (chair follow for safety) Gait Distance (Feet): 130 Feet Assistive device: Rolling walker (2 wheeled) Gait Pattern/deviations: Step-through  pattern;Decreased stride length;Trunk flexed Gait velocity: grossly <0.4 m/s   General Gait Details: occasional cues for upright posture; improved use of RW this session, fair cadence; chair follow for safety   Stairs Stairs: Yes Stairs assistance: Min guard Stair Management: Two rails;Forwards Number of Stairs: 4 General stair comments: pt ascended/descended 2 stairs x2 reps with BUE support of rails and cues for sequencing safely; pt at times ignoring cues for step-to gait pattern and attempts reciprocal stepping, but more stable using step-to pattern.   Wheelchair Mobility    Modified Rankin (Stroke Patients Only)       Balance Overall balance assessment: Needs assistance Sitting-balance support: Feet supported Sitting balance-Leahy Scale: Good     Standing balance support: Bilateral upper extremity supported Standing balance-Leahy Scale: Fair Standing balance comment: BUE support for gait needed; able to static stand with U UE support                            Cognition Arousal/Alertness: Awake/alert Behavior During Therapy: WFL for tasks assessed/performed Overall Cognitive Status: Impaired/Different from baseline Area of Impairment: Attention;Memory;Following commands;Problem solving;Safety/judgement                   Current Attention Level: Selective Memory: Decreased recall of precautions Following Commands: Follows one step commands with increased time Safety/Judgement: Decreased awareness of deficits   Problem Solving: Slow processing;Requires verbal cues;Requires tactile cues;Difficulty sequencing General Comments: pt follows most 1-step commands, but has difficulty with multi-step commands and ignores some cues when sequencing steps.      Exercises General Exercises - Lower Extremity Ankle Circles/Pumps:  AROM;Strengthening;Both;10 reps;Seated Long Arc Quad: AROM;Strengthening;Both;5 reps;Seated    General Comments General comments  (skin integrity, edema, etc.): pt anxious for d/c, given call bell and instructed not to get up without staff assist      Pertinent Vitals/Pain Pain Assessment: No/denies pain   Vitals:   08/17/20 1210 (taken just after mobility)  BP: (!) 174/76  Pulse: 62  Resp:   Temp:   SpO2: 97%    Home Living                      Prior Function            PT Goals (current goals can now be found in the care plan section) Acute Rehab PT Goals Patient Stated Goal: go home as soon as possible today PT Goal Formulation: With patient Time For Goal Achievement: 08/30/20 Potential to Achieve Goals: Good Progress towards PT goals: Progressing toward goals    Frequency    Min 4X/week      PT Plan Current plan remains appropriate    Co-evaluation              AM-PAC PT "6 Clicks" Mobility   Outcome Measure  Help needed turning from your back to your side while in a flat bed without using bedrails?: A Little Help needed moving from lying on your back to sitting on the side of a flat bed without using bedrails?: A Little Help needed moving to and from a bed to a chair (including a wheelchair)?: A Little Help needed standing up from a chair using your arms (e.g., wheelchair or bedside chair)?: A Little Help needed to walk in hospital room?: A Little Help needed climbing 3-5 steps with a railing? : A Little 6 Click Score: 18    End of Session Equipment Utilized During Treatment: Gait belt Activity Tolerance: Patient tolerated treatment well Patient left: in chair;with call bell/phone within reach;with chair alarm set Nurse Communication: Mobility status PT Visit Diagnosis: Unsteadiness on feet (R26.81);Muscle weakness (generalized) (M62.81);Difficulty in walking, not elsewhere classified (R26.2)     Time: 7416-3845 PT Time Calculation (min) (ACUTE ONLY): 13 min  Charges:  $Gait Training: 8-22 mins                     Daxtin Leiker P., PTA Acute Rehabilitation  Services Pager: [phone] Office: [place] 08/17/2020, 2:18 PM

## 2020-08-17 NOTE — Progress Notes (Signed)
Occupational Therapy Treatment Patient Details Name: [name] MRN: [id number] DOB: [birth year] Today's Date: 08/17/2020    History of present illness Pt is a 86 y/o male who presents after a fall while shopping at [employer]. Pt sustained a parafalcine and tentorial SDH. PMHx includes HTN, CAD, COPD, A-fib   OT comments  Pt progressing towards OT goals. On entry, pt attempting to don pants in anticipation of possible discharge home today. Pt overall Min A for donning pants at bedside, some difficulty problem solving noted. Pt overall Supervision for simple transfers with RW, benefits from UE support but no overt LOB. Further assessed functional cognition with pill box test. Pt noted to double check and re-read labels but difficulty in actually implementing the instructions. Pt did not pass the pill box test as many rows/columns were not completed. Educated and encouraged pt to have family assist with med mgmt initially at home to maximize safety. Pt verbalized understanding of education.    Follow Up Recommendations  Home health OT;Supervision/Assistance - 24 hour    Equipment Recommendations  None recommended by OT    Recommendations for Other Services      Precautions / Restrictions Precautions Precautions: Fall Precaution Comments: watch HR, stays in 50s Restrictions Weight Bearing Restrictions: No       Mobility Bed Mobility               General bed mobility comments: sitting EOB on entry  Transfers Overall transfer level: Needs assistance Equipment used: Rolling walker (2 wheeled) Transfers: Sit to/from [facility] Sit to Stand: Supervision Stand pivot transfers: Supervision       General transfer comment: Supervision for simple transfers to chair with RW. Benefits from UE support but no overt LOB noted    Balance Overall balance assessment: Needs assistance Sitting-balance support: Feet supported Sitting balance-Leahy Scale: Good      Standing balance support: Bilateral upper extremity supported Standing balance-Leahy Scale: Fair Standing balance comment: fair static standing to don pants, requires B UE support for dynamic tasks                           ADL either performed or assessed with clinical judgement   ADL Overall ADL's : Needs assistance/impaired                     Lower Body Dressing: Minimal assistance;Sit to/from stand Lower Body Dressing Details (indicate cue type and reason): Min A for donning pants sitting EOB. Pt reports typically propping LEs on bed for this task but declined to do this at this time. Difficulty problem solving and increased time for task. Pants leg got stuck on L sock           Tub/Shower Transfer Details (indicate cue type and reason): Pt reports typically standing for showering tasks. Educated pt to sit for tasks or have supervision initially to ensure safety   General ADL Comments: Pt with improved physical abilities, still some difficulties cognition-wise but pt pleasant and receptive of education     Vision   Vision Assessment?: No apparent visual deficits   Perception     Praxis      Cognition Arousal/Alertness: Awake/alert Behavior During Therapy: WFL for tasks assessed/performed Overall Cognitive Status: Impaired/Different from baseline Area of Impairment: Attention;Memory;Following commands;Problem solving;Safety/judgement                   Current Attention Level: Selective Memory: Decreased recall  of precautions Following Commands: Follows one step commands with increased time Safety/Judgement: Decreased awareness of deficits   Problem Solving: Slow processing;Requires verbal cues;Requires tactile cues;Difficulty sequencing General Comments: Assessed cognition further with pill box test. Pt noted with difficulty following instructions of task, did not fill out entire box, required cues for completion/initiation. Pt was noted to  double check and re-read label instructions, but difficulty implementing the instructions. Did not pass pill box test at this time        Exercises     Shoulder Instructions       General Comments Administered pill box test during session (see cognition section)    Pertinent Vitals/ Pain       Pain Assessment: No/denies pain  Home Living                                          Prior Functioning/Environment              Frequency  Min 2X/week        Progress Toward Goals  OT Goals(current goals can now be found in the care plan section)  Progress towards OT goals: Progressing toward goals  Acute Rehab OT Goals Patient Stated Goal: go home as soon as possible today OT Goal Formulation: With patient Time For Goal Achievement: 08/30/20 Potential to Achieve Goals: Good ADL Goals Pt Will Perform Grooming: with supervision;sitting;standing Pt Will Perform Lower Body Bathing: with supervision;sitting/lateral leans;sit to/from stand Pt Will Perform Lower Body Dressing: with supervision;sit to/from stand;sitting/lateral leans Pt Will Transfer to Toilet: with supervision;ambulating;grab bars Pt Will Perform Toileting - Clothing Manipulation and hygiene: with supervision;sit to/from stand Pt Will Perform Tub/Shower Transfer: Tub transfer;with supervision;rolling walker;tub bench Additional ADL Goal #1: Pt will participate in further cognitive assessment.  Plan Discharge plan remains appropriate    Co-evaluation                 AM-PAC OT "6 Clicks" Daily Activity     Outcome Measure   Help from another person eating meals?: None Help from another person taking care of personal grooming?: A Little Help from another person toileting, which includes using toliet, bedpan, or urinal?: A Little Help from another person bathing (including washing, rinsing, drying)?: A Little Help from another person to put on and taking off regular upper body  clothing?: A Little Help from another person to put on and taking off regular lower body clothing?: A Little 6 Click Score: 19    End of Session Equipment Utilized During Treatment: Rolling walker  OT Visit Diagnosis: Unsteadiness on feet (R26.81);Other symptoms and signs involving cognitive function;History of falling (Z91.81)   Activity Tolerance Patient tolerated treatment well   Patient Left in chair;with call bell/phone within reach   Nurse Communication Mobility status        Time: 5974-1638 OT Time Calculation (min): 27 min  Charges: OT General Charges $OT Visit: 1 Visit OT Treatments $Self Care/Home Management : 23-37 mins  Layla Maw, OTR/L   Layla Maw 08/17/2020, 1:35 PM

## 2020-08-18 ENCOUNTER — Encounter: Payer: Self-pay | Admitting: Nurse Practitioner

## 2020-08-20 ENCOUNTER — Telehealth: Payer: Self-pay | Admitting: Physician Assistant

## 2020-08-20 NOTE — Telephone Encounter (Signed)
Paged by the patient's wife to report asymptomatic bradycardia in the high 40s.  Patient was last seen by Ludwig Lean NP via virtual visit in October, heart rate at the time was 49 bpm as reported by the patient.  Unfortunately he recently suffered head trauma and had a subarachnoid bleed and is currently recovering.  He was prescribed Keppra which made him slightly drowsy.  Otherwise no dizziness, blurred vision or feeling of passing out.  Given age symptomatic nature of bradycardia, recommend continue on the current metoprolol tartrate 25 mg twice daily.  On repeat blood pressure and heart rate check, heart rate is now in the 50s.  He has a history of atrial fibrillation and has been on the current dose of metoprolol for a long time.

## 2020-08-21 NOTE — Telephone Encounter (Signed)
Can someone please call Mr. Souder later today to just check on him - he does have PVCs which may make the HR that they obtain not accurate.   Would like to get his appointment that he missed last week rescheduled for in about a month or so with EKG.   Thanks,  Vesna Kable

## 2020-08-21 NOTE — Telephone Encounter (Signed)
Attempted phone call to pt.  Per Epic it is OK to leave detailed message on answering machine.  Left message informing pt call was to follow up with pt to see how he is feeling per Macarthur Critchley request.  Pt also advised to call scheduling at [phone] to schedule appointment with Truitt Merle for one month.

## 2020-09-07 ENCOUNTER — Other Ambulatory Visit: Payer: Self-pay | Admitting: Pulmonary Disease

## 2020-09-11 ENCOUNTER — Telehealth: Payer: Self-pay | Admitting: *Deleted

## 2020-09-11 NOTE — Telephone Encounter (Signed)
S/w pt gave clear instructions to stay on same meds for now unless pt gets more lightheaded or dizzy and bring ALL medication bottles to next appt.

## 2020-09-11 NOTE — Telephone Encounter (Signed)
Pt calling in today to discuss bp only has lightheadedness when pt gets up in the am.  BP readings are as follows:  Jan 3  167/84  HR 46 Jan 2  159/87  HR  49 Jan 1  140/95  HR  57 Dec 31 142/60  HR 56 Dec 30  178/87  HR  52  Will call pt back when [place] advises.

## 2020-09-11 NOTE — Telephone Encounter (Signed)
Let's just follow for now - bring all medicines to his next visit too.   Lawson Fiscal

## 2020-09-20 NOTE — Progress Notes (Signed)
CARDIOLOGY OFFICE NOTE  Date:  10/04/2020    [name] Date of Birth: [birth year] Medical Record [id number]  PCP:  [name], MD  Cardiologist:  [name]    Chief Complaint  Patient presents with  . Follow-up    Seen for Dr. [name]    History of Present Illness: [name] is a 87 y.o. male who presents today for a follow up visit.  Seen for Dr. [name]. He has seen Dr. [name] in the remote past. Primarily follows with me.    He has a h/o PVC ablation 05/29/10, PAF, obesity, sedentary lifestyle, HLD, gout, OSA treated with CPAP, & dysfunctional diaphragm with chronic dyspnea previously followed by Dr. [name] - now with Dr. [name]. He has mild CAD per remote cath back in August of 2011. Labs are checked by primary care.    Seen back in February of 2016 by Dr. [name] - this was for evaluation of dyspnea.  Dr. [name] wanted cardiac status reevaluated to assess for any cardiac issues that may be contributing to dyspnea. Was referred for Myoview and echocardiogram. These studies were satisfactory.  Encouraged to cut back on his alcohol intake as well.    I have followed him since - he has been holding his own - dyspnea is chronic. He has gotten back with pulmonary - Dr. [name]. Balance issues and has had some falls. Has ended up having documented AF - was placed on anticoagulation - initially with Eliquis - but then transitioned to Coumadin due to cost and then back to Eliquis but was stopped due to GI bleeding - had chronic gastritis on EGD & 3 polyps removed - had required transfusion. Had had lots of epistaxis as well. Really struggling with the pandemic - not able to go to rehab - remains pretty short of breath with all activity - he is now on oxygen and had transitioned to a walker. He has had some falls.    He had COVID back in March despite being vaccinated - was in the ER - did not wish to be admitted despite having associated CHF.    Then had several  phone calls - BP was up - medicines were not clear - then noted that he was having swelling and weight gain. Very difficult to figure out over the phone - thus asked him to come to the office today for assessment. BP still up. Spending way too much time in bed. I increased his beta blocker. Ended up adding Norvasc as well. Improved with salt restriction, increasing activity - to the point that we were able to cut medicines back again.  Echo was reassuring.  Last seen in August - medicines seemed a little unclear again. Wife was out of town. Breathing was stable. He had had a fall - no injury.   We last did a virtual phone visit back in October - he could not physically come in due to falling/poor balance.   Was to have seen him back last month - but had had a fall while in [employer] - ended up at the hospital with a SDH.  Supportive care recommended by neurosurgery.   Comes in today. Here with his wife today. He is using a walker. His balance is very poor - getting worse. He had a 7 day course of Keppra after this last admission - has finished with that. He talked with Dr. Philip Aspen last week by phone. There has been discussion about possible referral  to Neurology. She notes more forgetfulness since the fall. He has no headache. Not nauseated. He is awake and arousable. She is upset that he has not had any rehab/follow up since discharge - she has called on that. Looks like the referral is there - just has not been scheduled. He is dosing his own medicines. He does not know what he actually takes. Thought his Anoro was a blood thinner. He remains depressed. Apparently their insurance sent a nurse out to see him - she was concerned about his depression. He does not have a plan to commit suicide. He is not driving.   Past Medical History:  Diagnosis Date  . Afib (HCC)   . Arthritis   . BPH (benign prostatic hyperplasia)   . COPD (chronic obstructive pulmonary disease) (HCC)   . Coronary artery disease   .  Coronary artery ectasia    Mild CAD with normal systolic function per cath in August of 2011  . Depression   . Diaphragmatic paralysis    felt to be partially responsible for dyspnea  . Dyspnea    due to paralyzed diaphragm and pulmonary issues  . Dysrhythmia   . GERD (gastroesophageal reflux disease)   . Gout   . HTN (hypertension)   . Hypercholesteremia   . Hypertension   . Iron deficiency anemia   . Microhematuria   . Obesity   . OSA (obstructive sleep apnea)    CPAP  . Paroxysmal atrial fibrillation (HCC)    occured in the setting of acute E Coli sepsis and ileius 7/12  . PVC (premature ventricular contraction)    s/p PVC ablation 05/29/2010    Past Surgical History:  Procedure Laterality Date  . APPENDECTOMY  1978  . BIOPSY  02/09/2019   Procedure: BIOPSY;  Surgeon: Vida Rigger, MD;  Location: [facility];  Service: Endoscopy;;  . BIOPSY  03/02/2019   Procedure: BIOPSY;  Surgeon: Vida Rigger, MD;  Location: [facility];  Service: Endoscopy;;  . CARDIAC CATHETERIZATION  04-30-2010   Left main coronary artery is normal.   . CARDIOVASCULAR STRESS TEST  03-19-2010   0%  . CHOLECYSTECTOMY  1990  . COLONOSCOPY N/A 03/02/2019   Procedure: COLONOSCOPY;  Surgeon: Vida Rigger, MD;  Location: [facility];  Service: Endoscopy;  Laterality: N/A;  . COLONOSCOPY W/ POLYPECTOMY    . ESOPHAGOGASTRODUODENOSCOPY (EGD) WITH PROPOFOL N/A 02/09/2019   Procedure: ESOPHAGOGASTRODUODENOSCOPY (EGD) WITH PROPOFOL;  Surgeon: Vida Rigger, MD;  Location: [facility];  Service: Endoscopy;  Laterality: N/A;  . EYE SURGERY Right    catarct  . poly removed from nose as a child    . PVC ablation  05/29/2010  . US ECHOCARDIOGRAPHY  03-09-2010   EF 55-60%     Medications: Current Meds  Medication Sig  . acetaminophen (TYLENOL) 325 MG tablet Take 2 tablets (650 mg total) by mouth every 6 (six) hours as needed for mild pain.  Marland Kitchen allopurinol (ZYLOPRIM) 300 MG tablet Take 300 mg by mouth daily.  Ailene Ards  ELLIPTA 62.5-25 MCG/INH AEPB Inhale 1 puff into the lungs daily.  Marland Kitchen aspirin EC 81 MG tablet Take 1 tablet (81 mg total) by mouth daily.  Marland Kitchen buPROPion (WELLBUTRIN SR) 100 MG 12 hr tablet Take 100 mg by mouth 2 (two) times daily.   Marland Kitchen doxazosin (CARDURA) 4 MG tablet Take 0.5 tablets (2 mg total) by mouth at bedtime.  . ferrous sulfate 325 (65 FE) MG tablet Take 325 mg by mouth daily with breakfast.  .  hydrochlorothiazide (HYDRODIURIL) 25 MG tablet Take 25 mg by mouth daily.  Marland Kitchen levETIRAcetam (KEPPRA) 500 MG tablet Take 1 tablet (500 mg total) by mouth 2 (two) times daily for 7 days.  Marland Kitchen lovastatin (MEVACOR) 40 MG tablet Take 40 mg by mouth daily.  . metoprolol tartrate (LOPRESSOR) 25 MG tablet Take 25 mg by mouth 2 (two) times daily.  . OXYGEN Inhale 2 L/min into the lungs daily as needed (SOB).   . pantoprazole (PROTONIX) 40 MG tablet Take 40 mg by mouth daily.  . sertraline (ZOLOFT) 100 MG tablet Take 100 mg by mouth daily.  . traZODone (DESYREL) 100 MG tablet Take 100 mg by mouth at bedtime.   . vitamin B-12 (CYANOCOBALAMIN) 500 MCG tablet Take 500 mcg by mouth daily.     Allergies: No Known Allergies  Social History: The patient  reports that he has never smoked. He has never used smokeless tobacco. He reports that he does not drink alcohol and does not use drugs.   Family History: The patient's family history includes Aneurysm (age of onset: 1) in his father; Breast cancer in his daughter and mother.   Review of Systems: Please see the history of present illness.   All other systems are reviewed and negative.   Physical Exam: VS:  BP 122/70   Pulse (!) 47   Ht 5\' 10"  (1.778 m)   Wt 200 lb 12.8 oz (91.1 kg)   SpO2 96%   BMI 28.81 kg/m  .  BMI Body mass index is 28.81 kg/m.  Wt Readings from Last 3 Encounters:  10/04/20 200 lb 12.8 oz (91.1 kg)  08/15/20 199 lb 15.3 oz (90.7 kg)  06/19/20 199 lb (90.3 kg)    General: Pleasant. Elderly. He is alert. Balance is very poor.   Cardiac: Irregular - heart tones are distant. No edema.  Respiratory:  Lungs are diminished.  MS: No deformity or atrophy. Gait and ROM intact.  Skin: Warm and dry. Color is normal.  Neuro:  Strength and sensation are intact and no gross focal deficits noted.  Psych: Alert, appropriate and with normal affect.   LABORATORY DATA:  EKG:  EKG is ordered today.  Personally reviewed by me. This demonstrates AF with slow VR - rate is 47.  Lab Results  Component Value Date   WBC 7.3 08/16/2020   HGB 13.4 08/16/2020   HCT 41.8 08/16/2020   PLT 142 (L) 08/16/2020   GLUCOSE 131 (H) 08/16/2020   ALT 13 12/08/2019   AST 22 12/08/2019   NA 138 08/16/2020   K 3.6 08/16/2020   CL 94 (L) 08/16/2020   CREATININE 1.07 08/16/2020   BUN 21 08/16/2020   CO2 31 08/16/2020   TSH 2.640 01/05/2019   INR 1.0 08/15/2020       BNP (last 3 results) Recent Labs    11/14/19 1850  BNP 1,615.9*    ProBNP (last 3 results) No results for input(s): PROBNP in the last 8760 hours.   Other Studies Reviewed Today:  ECHO IMPRESSIONS 11/2019   1. Incessant ectopy makes wall motion analysis challenging. Cannot  confidently exclude basal inferolateral hypokinesis, but suspect  PVC-related artifact. Other wall segments contract normally. Left  ventricular ejection fraction, by estimation, is 55 to  60%. The left ventricle has normal function. The left ventricle has no  regional wall motion abnormalities. There is moderate concentric left  ventricular hypertrophy. Left ventricular diastolic function could not be  evaluated.   2. Right ventricular systolic function  is normal. The right ventricular  size is normal. The estimated right ventricular systolic pressure is 35.5  mmHg.   3. Left atrial size was severely dilated.   4. The mitral valve is normal in structure. Mild mitral valve  regurgitation.   5. The aortic valve is tricuspid. Aortic valve regurgitation is trivial.  Mild to moderate aortic  valve sclerosis/calcification is present, without  any evidence of aortic stenosis.   6. The inferior vena cava is dilated in size with >50% respiratory  variability, suggesting right atrial pressure of 8 mmHg.      Holter 02/2018 Narrative & Impression      Sinus rhythm with first degree AV block, Right bundle branch block Average heart rate is 62 bpm Occasional premature ventricular contractions Frequent premature atrial contractions with nonsustained atrial tachycardia Nonsustained atrial fibrillation also observed Nocturnal bradycardia with mobitz I second degree AV block is noted transiently No sustained arrhythmias No prolonged pauses         Myoview Impression from March 2016 Exercise Capacity:  Lexiscan with no exercise. BP Response:  Normal blood pressure response. Clinical Symptoms:  Nausea. ECG Impression:  Atrial fibrillation, no changes from baseline.   Comparison with Prior Nuclear Study: No images to compare   Overall Impression:  Low risk stress nuclear study a medium-sized basal inferior and basal to mid inferolateral perfusion defect that is actually worse at rest than with stress.  No ischemia.  Given normal wall motion, this may represent diaphragmatic attenuation. .   LV Ejection Fraction: 63%.  LV Wall Motion:  NL LV Function; NL Wall Motion    Loralie Champagne 11/07/2014         ASSESSMENT & PLAN:     1. Prior fall - has had SDH - he was off anticoagulation (stopped a few years ago) and will continue to stay off - she notes more forgetfulness - agree with referral to neurosurgery and neurology. Hope to get established with some home therapy as well.   2. Depression - this is chronic - he does not have a plan to commit suicide.   3. HTN - BP a little labile - ok here today.   4. PAF - he is back in AF today - his rate is slow - will stop Lopressor - unfortunately, his risk of stroke is high - he has had recent SDH after a fall. He is not a candidate for  anticoagulation - had prior GI bleeding and severe epistaxis in the past. They are both aware of this.   5. Mild CAD per remote cath in 2011. No active chest pain - would favor medical management.   6. COPD with chronic DOE  7. HLD  Current medicines are reviewed with the patient today.  The patient does not have concerns regarding medicines other than what has been noted above.  The following changes have been made:  See above.  Labs/ tests ordered today include:   No orders of the defined types were placed in this encounter.    Disposition:   FU me with in about 2 weeks. Stopping Lopressor today. Will then plan on transitioning over to [place], [place]. I have told them that I am leaving next month. Hope that he can get neurology, neurosurgery and rehab referrals in place. Wife to get medicines verified for [place].    Patient is agreeable to this plan and will call if any problems develop in the interim.   SignedTruitt Merle, NP  10/04/2020  3:55 PM  [facility] [address] [place], [place]  [address] Phone: [phone] Fax: [phone]

## 2020-10-04 ENCOUNTER — Encounter: Payer: Self-pay | Admitting: Nurse Practitioner

## 2020-10-04 ENCOUNTER — Ambulatory Visit: Payer: Medicare Other | Admitting: Nurse Practitioner

## 2020-10-04 ENCOUNTER — Other Ambulatory Visit: Payer: Self-pay

## 2020-10-04 VITALS — BP 122/70 | HR 47 | Ht 70.0 in | Wt 200.8 lb

## 2020-10-04 DIAGNOSIS — Z79899 Other long term (current) drug therapy: Secondary | ICD-10-CM

## 2020-10-04 DIAGNOSIS — S065XAA Traumatic subdural hemorrhage with loss of consciousness status unknown, initial encounter: Secondary | ICD-10-CM

## 2020-10-04 DIAGNOSIS — I10 Essential (primary) hypertension: Secondary | ICD-10-CM

## 2020-10-04 DIAGNOSIS — J432 Centrilobular emphysema: Secondary | ICD-10-CM

## 2020-10-04 DIAGNOSIS — I4819 Other persistent atrial fibrillation: Secondary | ICD-10-CM

## 2020-10-04 DIAGNOSIS — I259 Chronic ischemic heart disease, unspecified: Secondary | ICD-10-CM

## 2020-10-04 DIAGNOSIS — R0602 Shortness of breath: Secondary | ICD-10-CM

## 2020-10-04 DIAGNOSIS — S065X9A Traumatic subdural hemorrhage with loss of consciousness of unspecified duration, initial encounter: Secondary | ICD-10-CM | POA: Diagnosis not present

## 2020-10-04 NOTE — Progress Notes (Signed)
CARDIOLOGY OFFICE NOTE  Date:  10/17/2020    [name] Date of Birth: Jul 12, 1934 Medical Record [id number]  PCP:  [name], MD  Cardiologist:  [name]   Chief Complaint  Patient presents with  . Follow-up    Seen for Dr. [name]    History of Present Illness: TOMY TIBBALS is a 87 y.o. male who presents today for a follow up visit. Seen for Dr. [name]. He has seen Dr. [name] in the remote past. Primarily follows with me.    He has a h/o PVC ablation 05/29/10, PAF, obesity, sedentary lifestyle, HLD, gout, OSA treated with CPAP, & dysfunctional diaphragm with chronic dyspnea previously followed by Dr. [name] - now with Dr. [name]. He has mild CAD per remote cath back in August of 2011. Labs are checked by primary care.    Seen back in February of 2016 by Dr. [name] - this was for evaluation of dyspnea.  Dr. [name] wanted cardiac status reevaluated to assess for any cardiac issues that may be contributing to dyspnea. Was referred for Myoview and echocardiogram. These studies were satisfactory.  Encouraged to cut back on his alcohol intake as well.    I have followed him since - he has been holding his own - dyspnea is chronic. He has gotten back with pulmonary - Dr. [name]. Balance issues and has had some falls. Has ended up having documented AF - was placed on anticoagulation - initially with Eliquis - but then transitioned to Coumadin due to cost and then back to Eliquis but was stopped due to GI bleeding - had chronic gastritis on EGD & 3 polyps removed - had required transfusion. Had had lots of epistaxis as well. Really struggling with the pandemic - not able to go to rehab - remains pretty short of breath with all activity - he is now on oxygen and had transitioned to a walker. He has had some falls.    He had COVID back in March 2021 despite being vaccinated - was in the ER - did not wish to be admitted despite having associated CHF.    Then had  several phone calls - BP was up - medicines were not clear - then noted that he was having swelling and weight gain. Very difficult to figure out over the phone - thus asked him to come to the office today for assessment. BP still up. Spending way too much time in bed. I increased his beta blocker. Ended up adding Norvasc as well. Improved with salt restriction, increasing activity - to the point that we were able to cut medicines back again.  Echo was reassuring.  Last seen in August - medicines seemed a little unclear again. Wife was out of town. Breathing was stable. He had had a fall - no injury.    We last did a virtual phone visit back in October - he could not physically come in due to falling/poor balance.    Was to have seen him back in December - but had had a fall while in [employer] - ended up at the hospital with a SDH.  Supportive care recommended by neurosurgery. I then saw him just a few weeks ago - using a walker - balance was awful. Had done a 7 day course of Keppra - waiting on referral to Neurology. Wife was here and had lots of questions. Waiting on rehab referral. He seemed more confused - mixed up on his medicines. Was noted  to be back in AF. Not a candidate for anticoagulation. The insurance nurse was concerned about his depression - this has been chronic - not suicidal. Very sad situation overall.    Comes in today. Here with his wife who augments the history. He notes he "can't walk and just falls". He has seen Neurology - getting worked up for neuropathy. There was concern he had been on amiodarone in the past - he has not - wife misunderstood and thought it was amlodipine which he has been on in the past. His balance is very bad. No other falls since he was last here. BP unclear at home. Needs to get his cuff checked for correlation. No chest pain. Breathing is stable - chronically short of breath. No palpitations.   She feels he is drinking too much scotch - in talking with him - he  has had what he describes as one drink every night - she feels this is more than that quantity. Only getting rehab about once a week due to staffing/COVID issues.   Past Medical History:  Diagnosis Date  . Afib ([place])   . Arthritis   . BPH (benign prostatic hyperplasia)   . COPD (chronic obstructive pulmonary disease) ([place])   . Coronary artery disease   . Coronary artery ectasia    Mild CAD with normal systolic function per cath in August of 2011  . Depression   . Diaphragmatic paralysis    felt to be partially responsible for dyspnea  . Dyspnea    due to paralyzed diaphragm and pulmonary issues  . Dysrhythmia   . GERD (gastroesophageal reflux disease)   . Gout   . HTN (hypertension)   . Hypercholesteremia   . Hypertension   . Iron deficiency anemia   . Microhematuria   . Obesity   . OSA (obstructive sleep apnea)    CPAP  . Paroxysmal atrial fibrillation ([facility])    occured in the setting of acute E Coli sepsis and ileius 7/12  . PVC (premature ventricular contraction)    s/p PVC ablation 05/29/2010    Past Surgical History:  Procedure Laterality Date  . APPENDECTOMY  1978  . BIOPSY  02/09/2019   Procedure: BIOPSY;  Surgeon: Clarene Essex, MD;  Location: [facility];  Service: Endoscopy;;  . BIOPSY  03/02/2019   Procedure: BIOPSY;  Surgeon: Clarene Essex, MD;  Location: [facility];  Service: Endoscopy;;  . CARDIAC CATHETERIZATION  04-30-2010   Left main coronary artery is normal.   . CARDIOVASCULAR STRESS TEST  03-19-2010   0%  . CHOLECYSTECTOMY  1990  . COLONOSCOPY N/A 03/02/2019   Procedure: COLONOSCOPY;  Surgeon: Clarene Essex, MD;  Location: [facility];  Service: Endoscopy;  Laterality: N/A;  . COLONOSCOPY W/ POLYPECTOMY    . ESOPHAGOGASTRODUODENOSCOPY (EGD) WITH PROPOFOL N/A 02/09/2019   Procedure: ESOPHAGOGASTRODUODENOSCOPY (EGD) WITH PROPOFOL;  Surgeon: Clarene Essex, MD;  Location: [facility];  Service: Endoscopy;  Laterality: N/A;  . EYE SURGERY Right    catarct  . poly  removed from nose as a child    . PVC ablation  05/29/2010  . US ECHOCARDIOGRAPHY  03-09-2010   EF 55-60%     Medications: Current Meds  Medication Sig  . acetaminophen (TYLENOL) 325 MG tablet Take 2 tablets (650 mg total) by mouth every 6 (six) hours as needed for mild pain.  Marland Kitchen allopurinol (ZYLOPRIM) 300 MG tablet Take 300 mg by mouth daily.  [name] 62.5-25 MCG/INH AEPB Inhale 1 puff into the lungs  daily.  . buPROPion (WELLBUTRIN SR) 100 MG 12 hr tablet Take 100 mg by mouth 2 (two) times daily.   Marland Kitchen doxazosin (CARDURA) 4 MG tablet Take 0.5 tablets (2 mg total) by mouth at bedtime.  . ferrous sulfate 325 (65 FE) MG tablet Take 325 mg by mouth daily with breakfast.  . hydrochlorothiazide (HYDRODIURIL) 25 MG tablet Take 25 mg by mouth daily.  Marland Kitchen lovastatin (MEVACOR) 40 MG tablet Take 40 mg by mouth daily.  . OXYGEN Inhale 2 L/min into the lungs daily as needed (SOB).   . pantoprazole (PROTONIX) 40 MG tablet Take 40 mg by mouth daily.  . sertraline (ZOLOFT) 100 MG tablet Take 100 mg by mouth daily.  . traZODone (DESYREL) 100 MG tablet Take 100 mg by mouth at bedtime.   . vitamin B-12 (CYANOCOBALAMIN) 500 MCG tablet Take 500 mcg by mouth daily.     Allergies: No Known Allergies  Social History: The patient  reports that he quit smoking about 37 years ago. His smoking use included cigarettes. He has never used smokeless tobacco. He reports that he does not drink alcohol and does not use drugs.   Family History: The patient's family history includes Aneurysm (age of onset: 33) in his father; Breast cancer in his daughter and mother.   Review of Systems: Please see the history of present illness.   All other systems are reviewed and negative.   Physical Exam: VS:  BP 130/72   Pulse 76   Ht 5\' 10"  (1.778 m)   Wt 194 lb (88 kg)   SpO2 97%   BMI 27.84 kg/m  .  BMI Body mass index is 27.84 kg/m.  Wt Readings from Last 3 Encounters:  10/17/20 194 lb (88 kg)  10/16/20 193 lb  (87.5 kg)  10/04/20 200 lb 12.8 oz (91.1 kg)   BP was 140/70 by me. He is shaky today. He reports feeling cold.   General: Chronically ill. Alert and in no acute distress.   Cardiac: Regular rate and rhythm but frequent ectopics. Heart tones are distant.  No edema.  Respiratory:  Lungs are clear to auscultation bilaterally with normal work of breathing.  GI: Soft and nontender.  MS: No deformity or atrophy. Gait and ROM intact. Using a walker.  Skin: Warm and dry. Color is normal.  Neuro:  Strength and sensation are intact and no gross focal deficits noted.  Psych: Alert, appropriate and with normal affect.   LABORATORY DATA:  EKG:  EKG is ordered today.  Personally reviewed by me. This demonstrates probable NSR with PACs - lots of artifact due to shaking.  Lab Results  Component Value Date   WBC 7.3 08/16/2020   HGB 13.4 08/16/2020   HCT 41.8 08/16/2020   PLT 142 (L) 08/16/2020   GLUCOSE 131 (H) 08/16/2020   ALT 13 12/08/2019   AST 22 12/08/2019   NA 138 08/16/2020   K 3.6 08/16/2020   CL 94 (L) 08/16/2020   CREATININE 1.07 08/16/2020   BUN 21 08/16/2020   CO2 31 08/16/2020   TSH 2.640 01/05/2019   INR 1.0 08/15/2020       BNP (last 3 results) Recent Labs    11/14/19 1850  BNP 1,615.9*    ProBNP (last 3 results) No results for input(s): PROBNP in the last 8760 hours.   Other Studies Reviewed Today:  ECHO IMPRESSIONS 11/2019   1. Incessant ectopy makes wall motion analysis challenging. Cannot  confidently exclude basal inferolateral hypokinesis, but suspect  PVC-related artifact. Other wall segments contract normally. Left  ventricular ejection fraction, by estimation, is 55 to  60%. The left ventricle has normal function. The left ventricle has no  regional wall motion abnormalities. There is moderate concentric left  ventricular hypertrophy. Left ventricular diastolic function could not be  evaluated.   2. Right ventricular systolic function is normal.  The right ventricular  size is normal. The estimated right ventricular systolic pressure is 01.0  mmHg.   3. Left atrial size was severely dilated.   4. The mitral valve is normal in structure. Mild mitral valve  regurgitation.   5. The aortic valve is tricuspid. Aortic valve regurgitation is trivial.  Mild to moderate aortic valve sclerosis/calcification is present, without  any evidence of aortic stenosis.   6. The inferior vena cava is dilated in size with >50% respiratory  variability, suggesting right atrial pressure of 8 mmHg.      Holter 02/2018 Narrative & Impression      Sinus rhythm with first degree AV block, Right bundle branch block Average heart rate is 62 bpm Occasional premature ventricular contractions Frequent premature atrial contractions with nonsustained atrial tachycardia Nonsustained atrial fibrillation also observed Nocturnal bradycardia with mobitz I second degree AV block is noted transiently No sustained arrhythmias No prolonged pauses         Myoview Impression from March 2016 Exercise Capacity:  Lexiscan with no exercise. BP Response:  Normal blood pressure response. Clinical Symptoms:  Nausea. ECG Impression:  Atrial fibrillation, no changes from baseline.   Comparison with Prior Nuclear Study: No images to compare   Overall Impression:  Low risk stress nuclear study a medium-sized basal inferior and basal to mid inferolateral perfusion defect that is actually worse at rest than with stress.  No ischemia.  Given normal wall motion, this may represent diaphragmatic attenuation. .   LV Ejection Fraction: 63%.  LV Wall Motion:  NL LV Function; NL Wall Motion    Loralie Champagne 11/07/2014         ASSESSMENT & PLAN:     1. 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 & Mrs. Sahli) both need to stop  their drinking - so they can support each other in this endeavor.   2. Prior fall with SDH - was not on anticoagulation at this time - this was stopped a few years ago - he is not felt to be a candidate to have this resumed.   3. Memory issues/TBI - now seeing Neurology.   4. Depression - this is chronic.   5. HTN - BP ok here - would elect to follow.   6. Mild CAD - per remote cath in 2011 - no active chest pain - would favor conservative management.   7. Alcohol use - this may be a big crux of his issues - see #1.   8 COPD with chronic DOE    Current medicines are reviewed with the patient today.  The patient does not have concerns regarding medicines other than what has been noted above.  The following changes have been made:  See above.  Labs/ tests ordered today include:    Orders Placed This Encounter  Procedures  . EKG 12-Lead     Disposition:   FU with Richardson Dopp, PA and Dr.  [name] going forward. His overall prognosis is felt to be quite tenuous. May need to consider palliative care.     Patient is agreeable to this plan and will call if any problems develop in the interim.   SignedTruitt Merle, NP  10/17/2020 4:00 PM  [place] [address] [place], [place]  [address] Phone: [phone] Fax: [phone]

## 2020-10-04 NOTE — Patient Instructions (Addendum)
After Visit Summary:  We will be checking the following labs today - NONE   Medication Instructions:    Continue with your current medicines. BUT  I am stopping Lopressor today  I am stopping aspirin  Clarify the medicines with this list given today.    If you need a refill on your cardiac medications before your next appointment, please call your pharmacy.     Testing/Procedures To Be Arranged:  N/A  Follow-Up:   See me in 2 weeks with EKG    At [facility], you and your health needs are our priority.  As part of our continuing mission to provide you with exceptional heart care, we have created designated Provider Care Teams.  These Care Teams include your primary Cardiologist (physician) and Advanced Practice Providers (APPs -  Physician Assistants and Nurse Practitioners) who all work together to provide you with the care you need, when you need it.  Special Instructions:  . Stay safe, wash your hands for at least 20 seconds and wear a mask when needed.  . It was good to talk with you both today.    Call the [place] office at [phone] if you have any questions, problems or concerns.

## 2020-10-06 NOTE — Addendum Note (Signed)
Addended by: Georgiann Cocker on: 10/06/2020 02:17 PM   Modules accepted: Orders

## 2020-10-12 ENCOUNTER — Encounter: Payer: Self-pay | Admitting: Neurology

## 2020-10-16 ENCOUNTER — Encounter: Payer: Self-pay | Admitting: Neurology

## 2020-10-16 ENCOUNTER — Ambulatory Visit: Payer: Medicare Other | Admitting: Neurology

## 2020-10-16 VITALS — BP 144/82 | HR 55 | Ht 70.0 in | Wt 193.0 lb

## 2020-10-16 DIAGNOSIS — S065X9A Traumatic subdural hemorrhage with loss of consciousness of unspecified duration, initial encounter: Secondary | ICD-10-CM | POA: Diagnosis not present

## 2020-10-16 DIAGNOSIS — I951 Orthostatic hypotension: Secondary | ICD-10-CM

## 2020-10-16 DIAGNOSIS — I4891 Unspecified atrial fibrillation: Secondary | ICD-10-CM | POA: Diagnosis not present

## 2020-10-16 DIAGNOSIS — D500 Iron deficiency anemia secondary to blood loss (chronic): Secondary | ICD-10-CM | POA: Diagnosis not present

## 2020-10-16 DIAGNOSIS — M6258 Muscle wasting and atrophy, not elsewhere classified, other site: Secondary | ICD-10-CM

## 2020-10-16 DIAGNOSIS — G629 Polyneuropathy, unspecified: Secondary | ICD-10-CM

## 2020-10-16 DIAGNOSIS — S065XAA Traumatic subdural hemorrhage with loss of consciousness status unknown, initial encounter: Secondary | ICD-10-CM

## 2020-10-16 DIAGNOSIS — M4807 Spinal stenosis, lumbosacral region: Secondary | ICD-10-CM

## 2020-10-16 DIAGNOSIS — Z9181 History of falling: Secondary | ICD-10-CM

## 2020-10-16 DIAGNOSIS — J9611 Chronic respiratory failure with hypoxia: Secondary | ICD-10-CM

## 2020-10-16 NOTE — Progress Notes (Addendum)
SLEEP MEDICINE CLINIC    Provider:  Melvyn Novasarmen  Esthela Brandner, [name]  Primary Care Physician:  [name], Daniel, [name] [address] [place] [id number]     Referring Provider: Jarome Matinaterson, Daniel, [name] [address] [place] [id number]          Chief Complaint according to patient   Patient presents with:    . New Patient (Initial Visit)           HISTORY OF PRESENT ILLNESS:  [name] is a 87 y.o. year old White or Caucasian male patient seen here as a referral on 10/16/2020 from [name].  Chief concern according to patient : " I can't walk and I fall."  [name] has been walking with a cane for about 3 years and over the last 6 month had to switch to a walker, he has had frequent falls since and suffered a bleed.  He has atrial fib. These falls let to his anticoagulation and metoprolol to be removed by cardiologist 14 days ago. He has new appointment with [name] tomorrow.  He is followed by pulmonology for COPD and pulmonology will address sleep apnea. The frequency of falls has increased and especially over the last 6 months.  On  December 7th,  he had to be hospitalized after a fall at [employer], and a brain bleed was found, a SDH.  since then repeated falls very 10-14 days.     [name] White or Caucasian male who  has a past medical history of Afib (HCC), Arthritis, BPH (benign prostatic hyperplasia), COPD (chronic obstructive pulmonary disease) (HCC), Coronary artery disease, Coronary artery ectasia, Depression, Diaphragmatic paralysis, Dyspnea, Dysrhythmia, GERD (gastroesophageal reflux disease), Gout, HTN (hypertension), Hypercholesteremia, Hypertension, Iron deficiency anemia, Microhematuria, Obesity, OSA (obstructive sleep apnea), Paroxysmal atrial fibrillation (HCC), and PVC (premature ventricular contraction). COVID 19 in March 2021, after 2 vaccines, is now boostered.  ED , [facility].      Social history:  Patient is  retired from  [name] and lives in a household with spouse,  No pets. Tobacco use; former smoker, quit 1885.   ETOH use: scotch 2 drinks every day , or more.  Caffeine intake in form of Coffee( /) Soda( /) Tea (/) or energy drinks. Regular exercise in form of walking.  .         Review of Systems: Out of a complete 14 system review, the patient complains of only the following symptoms, and all other reviewed systems are negative.:   Falls, poor balance.   Severely depressed by GDS 12 out of 15 points - severe.   Dementia.   Social History   Socioeconomic History  . Marital status: Married    Spouse name: Not on file  . Number of children: Not on file  . Years of education: Not on file  . Highest education level: Not on file  Occupational History  . Occupation: Retired  Tobacco Use  . Smoking status: Former Smoker    Types: Cigarettes    Quit date: 1985    Years since quitting: 37.1  . Smokeless tobacco: Never Used  Vaping Use  . Vaping Use: Never used  Substance and Sexual Activity  . Alcohol use: Never  . Drug use: Never  . Sexual activity: Yes  Other Topics Concern  . Not on file  Social History Narrative   ** Merged History Encounter **       Social Determinants  of Health   Financial Resource Strain: Not on file  Food Insecurity: Not on file  Transportation Needs: Not on file  Physical Activity: Not on file  Stress: Not on file  Social Connections: Not on file    Family History  Problem Relation Age of Onset  . Aneurysm Father 60       brain aneurysm  . Breast cancer Mother   . Breast cancer Daughter     Past Medical History:  Diagnosis Date  . Afib ([place])   . Arthritis   . BPH (benign prostatic hyperplasia)   . COPD (chronic obstructive pulmonary disease) ([place])   . Coronary artery disease   . Coronary artery ectasia    Mild CAD with normal systolic function per cath in August of 2011  . Depression   . Diaphragmatic paralysis    felt to  be partially responsible for dyspnea  . Dyspnea    due to paralyzed diaphragm and pulmonary issues  . Dysrhythmia   . GERD (gastroesophageal reflux disease)   . Gout   . HTN (hypertension)   . Hypercholesteremia   . Hypertension   . Iron deficiency anemia   . Microhematuria   . Obesity   . OSA (obstructive sleep apnea)    CPAP  . Paroxysmal atrial fibrillation (HCC)    occured in the setting of acute E Coli sepsis and ileius 7/12  . PVC (premature ventricular contraction)    s/p PVC ablation 05/29/2010    Past Surgical History:  Procedure Laterality Date  . APPENDECTOMY  1978  . BIOPSY  02/09/2019   Procedure: BIOPSY;  Surgeon: Clarene Essex, [name];  Location: [facility];  Service: Endoscopy;;  . BIOPSY  03/02/2019   Procedure: BIOPSY;  Surgeon: Clarene Essex, [name];  Location: [facility];  Service: Endoscopy;;  . CARDIAC CATHETERIZATION  04-30-2010   Left main coronary artery is normal.   . CARDIOVASCULAR STRESS TEST  03-19-2010   0%  . CHOLECYSTECTOMY  1990  . COLONOSCOPY N/A 03/02/2019   Procedure: COLONOSCOPY;  Surgeon: Clarene Essex, [name];  Location: [facility];  Service: Endoscopy;  Laterality: N/A;  . COLONOSCOPY W/ POLYPECTOMY    . ESOPHAGOGASTRODUODENOSCOPY (EGD) WITH PROPOFOL N/A 02/09/2019   Procedure: ESOPHAGOGASTRODUODENOSCOPY (EGD) WITH PROPOFOL;  Surgeon: Clarene Essex, [name];  Location: [facility];  Service: Endoscopy;  Laterality: N/A;  . EYE SURGERY Right    catarct  . poly removed from nose as a child    . PVC ablation  05/29/2010  . US ECHOCARDIOGRAPHY  03-09-2010   EF 55-60%     Current Outpatient Medications on File Prior to Visit  Medication Sig Dispense Refill  . acetaminophen (TYLENOL) 325 MG tablet Take 2 tablets (650 mg total) by mouth every 6 (six) hours as needed for mild pain.    Marland Kitchen allopurinol (ZYLOPRIM) 300 MG tablet Take 300 mg by mouth daily.    [name] 62.5-25 MCG/INH AEPB Inhale 1 puff into the lungs daily.    Marland Kitchen buPROPion (WELLBUTRIN SR) 100 MG 12  hr tablet Take 100 mg by mouth 2 (two) times daily.     Marland Kitchen doxazosin (CARDURA) 4 MG tablet Take 0.5 tablets (2 mg total) by mouth at bedtime.    . ferrous sulfate 325 (65 FE) MG tablet Take 325 mg by mouth daily with breakfast.    . hydrochlorothiazide (HYDRODIURIL) 25 MG tablet Take 25 mg by mouth daily.    Marland Kitchen lovastatin (MEVACOR) 40 MG tablet Take 40 mg by mouth daily.    Marland Kitchen  OXYGEN Inhale 2 L/min into the lungs daily as needed (SOB).     . pantoprazole (PROTONIX) 40 MG tablet Take 40 mg by mouth daily.    . sertraline (ZOLOFT) 100 MG tablet Take 100 mg by mouth daily.    . traZODone (DESYREL) 100 MG tablet Take 100 mg by mouth at bedtime.     . vitamin B-12 (CYANOCOBALAMIN) 500 MCG tablet Take 500 mcg by mouth daily.     No current facility-administered medications on file prior to visit.    No Known Allergies  Physical exam:  Today's Vitals   10/16/20 1511  BP: (!) 144/82  Pulse: (!) 55  Weight: 193 lb (87.5 kg)  Height: 5\' 10"  (1.778 m)   Body mass index is 27.69 kg/m.   Wt Readings from Last 3 Encounters:  10/16/20 193 lb (87.5 kg)  10/04/20 200 lb 12.8 oz (91.1 kg)  08/15/20 199 lb 15.3 oz (90.7 kg)     Ht Readings from Last 3 Encounters:  10/16/20 5\' 10"  (1.778 m)  10/04/20 5\' 10"  (1.778 m)  08/15/20 5\' 10"  (1.778 m)      General: The patient is awake, alert and appears not in acute distress. The patient is well groomed. Head: Normocephalic, atraumatic. Neck is supple. Mallampati 3,  neck circumference: 16 inches . Nasal airflow  patent. Dental status: biological.  Cardiovascular:  Regular rate and cardiac rhythm by pulse,  without distended neck veins. Respiratory: Lungs are clear to auscultation.  Skin:  With  evidence of mild ankle edema. Trunk: The patient's posture is erect.   Neurologic exam : The patient is awake and alert, oriented to place and time.   Memory subjective described as impaired - forgets password, pin number.  Attention span & concentration  ability appears normal.  Speech is non  fluent,  with dysarthria, dysphonia and  aphasia.  Mood and affect are appropriate.   Cranial nerves: no loss of smell or taste reported  Pupils are equal and briskly reactive to light. Funduscopic exam deferred. .  Extraocular movements in vertical and horizontal planes were intact and without nystagmus. No Diplopia. Visual fields by finger perimetry are intact. Hearing was impaired.   Facial sensation intact to fine touch.  Facial motor strength is symmetric and tongue and uvula move midline.  Neck ROM : rotation, tilt and flexion extension were normal for age and shoulder shrug was symmetrical.    Motor exam:  he lost muscle  mass on the lower right extremity , small than left.  Pronator drift on both sides.  Normal tone without cog wheeling, symmetric grip strength .   Sensory:   vibration sense reportedly  normal.  Proprioception tested in the upper extremities was impaired    Coordination: Rapid alternating movements in the fingers/hands were of normal speed.  The Finger-to-nose maneuver was impaired  with evidence of ataxia, and action-tremor. Pronator drift on the left.  Gait and station: Patient could not rise unassisted from a seated position, walked with a walker toe and heel walk were deferred.  Standing with eyes closed he drifted to the right.   Deep tendon reflexes: in the  upper and lower extremities are attenuated. Absent  Babinski response was deferred.      I reviewed the CT and other images with the patient, there is significant brain atrophy and intra-falcine Calcification, left sylvian fissure is enlarged , and his cardiology referral.   The cause of the [place] [employer] was unclear  -  he pushed a cart, when noting his legs felt "dead" and he believes he did not pass out. He hit his head hard, it bled and someone came to compress the wound.  His hands were not bloody - so I believe he did loose conscienceness. He  had driven himself - and had not driven since. He gets lost now.     After spending a total time of  60 minutes face to face and additional time for physical and neurologic examination, review of laboratory studies,  personal review of imaging studies, reports and results of other testing and review of referral information / records as far as provided in visit, I have established the following assessments:  1) Mr. Mccuskey suffered multiple falls in the progressive more frequent manner over the last 18 months about.  Since December he has had at least 3 severe falls about 14 to 10 days apart each.  The [place] that led to his hospitalization [place] [employer].  The patient was pushing a strapping cart and good bleeding knowledge of course but he felt that his knees were slowly buckling that he could not feel his legs I do not know how much morning there was however he did finally fall and he fell apparently backwards as he hit the high parietal scalp.  A subdural hemorrhage was caused by this fall and he had 9 stitches to treat the peripheral hematoma.  I also reviewed the images today it does not look like that of subdural hematoma has ever enlarged and my reason for reviewing is that the patient was recently asked that he may need to see a neurosurgeon as to his brain status I do not think a neurosurgical consult is necessary there may be reason to see a neurosurgeon for his spine as he walks with a slightly forward leaning gait and he has trouble to raise from a chair from a seated position and uses both arms to brace himself and there is obviously very little core strength and rather smallish muscles of both the thigh and the gastrocnemius and the right leg.  There is also a tremor noted this is an action tremor not addressed it is not parkinsonian and in addition there is a complete loss of deep tendon reflexes noted.  So altogether I think there is a progressive weakness.  There is most likely some  neuropathic findings.  There is also asymmetry of the muscles with the right lower extremity being smaller than the left and traditionally that has not been this way if not all his life and known factor so I do wonder if he either had a back related problem air if this is a stroke but was so far not worked up.  As to his declining memory and his declining exercise tolerance this may be well related to paroxysmal atrial fibrillation the chronic obstructive pulmonary disease, urinary frequency with Nocturia, possibly OSA,  gout but I am also thinking that we need to look at an MRI of the brain if he can. History of anemia with b 12 and iron deficiency and near syncope .  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  TBI with Dementia.  His breathing has worsened after COVID infection .     My Plan is to proceed with:  1) Brain MRI  With and without contrast rule out stroke,  2) Cervical spine MRI - tremor in upper extremities. Deconditioning.  3) lumbal spine CT. Rule out spinal stensois.  4) NCV and EMG lower  extremities.  5) Keppra to be stopped.   I would like to thank [name], [name] and [name], [place],  [place] [address] for allowing me to meet with and to take care of this pleasant patient.   In short, [name] is presenting with a multifactorial gait disorder and muscle atrophy. 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   I plan to follow up either personally or through our NP within 4 month.   CC: I will share my notes with PCP.   Electronically signed by: Larey Seat, [name] 10/16/2020 3:36 PM  [facility] Sleep Board certified by The [employer] of Sleep Medicine and Diplomate of the [employer] of Sleep Medicine. Board certified In Neurology through the [place], Fellow of the [employer] of Neurology. Medical Director of [employer].

## 2020-10-16 NOTE — Addendum Note (Signed)
Addended by: Larey Seat on: 10/16/2020 04:33 PM   Modules accepted: Orders

## 2020-10-16 NOTE — Patient Instructions (Signed)
Toxic Neuropathy Toxic neuropathy is nerve damage that is caused by poisons or chemicals (toxins). These toxins may be in a medicine or drug, in the environment, or at the workplace. Some examples include prescription medicines, industrial chemicals, poisons found in water or soil, insecticides, and cleaning solvents. These types of toxins can damage:  Nerve cells.  Nerve fibers.  Covering of a nerve fiber. The most common type of toxic neuropathy is damage to nerve cells that are farthest away from where the nerve leaves the spinal cord. In most cases, toxic neuropathy affects the nerves that go to the feet first. Toxic neuropathy can also damage nerve cells in the hands, arms, and legs. Damage from toxic neuropathy may improve slowly over time or it may be permanent. What are the causes? Many toxins can cause toxic neuropathy. Some common examples include:  Drugs.  Alcohol.  Pesticides.  Medicines for treating cancer (chemotherapy).  Lead.  Zinc.  Mercury.  Arsenic.  Toxins found in fish or shellfish.  Antifreeze.  Industrial chemicals like toluene. What are the signs or symptoms? Symptoms of toxic neuropathy can occur in the legs, hands, and arms. They can include:  Pain.  Loss of feeling (numbness) in the legs, hands, or arms.  Tingling in the legs, hands, or arms.  Weakness.  Trouble walking. Symptoms in other parts of the body are less common and can include:  Abnormal sweating or flushing.  Diarrhea or constipation.  Slow digestion, causing fullness or bloating.  Trouble passing urine.  Blurred vision.  Feeling dizzy.  Sexual function problems.  Cramping.  A strong urge to move the legs (restless legs). How is this diagnosed? This condition may be diagnosed based on:  Your symptoms and medical history. You will be asked about medicines or chemical exposure.  A physical exam. Your health care provider will check for numbness and  weakness. You may need to see a nerve specialist (neurologist) and have other tests to find the cause of your condition. These tests may include:  A neurological exam.  Blood and urine tests.  Electromyogram (EMG) and nerve conduction tests. These tests check nerve function. How is this treated? This condition may be treated by:  Eliminating exposure to the toxin. You may need to stop taking certain medicines, or remove contact with toxins in the environment.  Medicine to relieve pain, such as: ? Medicines that treat seizures (antiseizure medicines). ? Medicines that treat depression (antidepressants). ? Patches that are applied to the skin.  Physical therapy to help with movement, balance, and muscle strength.  Occupational therapy to learn ways to function with the condition.  Devices to help you move around (assistive devices).  Cold or heat therapy on the affected area.  Low-intensity transcutaneous electrical nerve stimulation (TENS).  Acupuncture. Follow these instructions at home: Medicines  Take over-the-counter and prescription medicines only as told by your health care provider.  Do not drive or use heavy machinery while taking prescription pain medicine. Lifestyle  Do not use any products that contain nicotine or tobacco, such as cigarettes and e-cigarettes. Smoking keeps blood from reaching damaged nerves. If you need help quitting, ask your health care provider.  Avoid or limit alcohol. Too much alcohol can cause a vitamin B deficiency, and vitamin B is needed for healthy nerves.  Eat a healthy diet. This includes: ? Eating foods that are high in fiber, such as fresh fruits and vegetables, whole grains, and beans. ? Limiting foods that are high in fat and processed  sugars, such as fried or sweet foods.   Managing pain and discomfort  If directed, work with a certified massage therapist to help relieve pain.  If directed, apply heat to the affected area as  often as told by your health care provider. Use the heat source that your health care provider recommends, such as a moist heat pack or a heating pad. To do this: ? Place a towel between your skin and the heat source. ? Leave the heat on for 20-30 minutes. ? Remove the heat if your skin turns bright red. This is especially important if you are unable to feel pain, heat, or cold. You may have a greater risk of getting burned.  If directed, put ice on the painful area. To do this: ? Put ice in a plastic bag. ? Place a towel between your skin and the bag. ? Leave the ice on for 20 minutes, 2-3 times a day. Safety  Take care to avoid burning or damaging your skin if you have numbness.  If you have numbness in your feet: ? Check every day for signs of injury or infection. Watch for redness, warmth, and swelling. ? Wear padded socks and supportive shoes. These help protect your feet.   General instructions  Return to your normal activities as told by your health care provider. Ask your health care provider what activities are safe for you.  Use assistive devices and attend physical therapy as told by your health care provider. This may include using a walker or a cane.  Develop a good support system. Having toxic neuropathy can be stressful.  Work with a pain specialist to find the best way to manage your pain. Ask about acupuncture, TENS therapy, or meditation techniques.  Keep all follow-up visits as told by your health care provider. This is important. Contact a health care provider if:  Your symptoms do not improve or get worse.  You have new symptoms of toxic neuropathy.  You are struggling emotionally from dealing with toxic neuropathy.  Your pain is not well-controlled.  You have side effects from any of your medicines. Get help right away if you:  You have an injury or infection that is not healing normally.  You have new weakness in an arm or leg.  You fall  frequently. Summary  Toxic neuropathy is nerve damage caused by poisons or chemicals.  The most common symptoms are pain, numbness, and tingling in your feet, legs, hands, or arms.  Eliminating exposure to the toxin is the most important part of treatment. Other treatments may be given to relieve the symptoms of neuropathy.  Recovering from toxic neuropathy takes time. Damage may be permanent in some cases. This information is not intended to replace advice given to you by your health care provider. Make sure you discuss any questions you have with your health care provider. Document Revised: 12/03/2017 Document Reviewed: 12/03/2017 Elsevier Patient Education  2021 [employer].

## 2020-10-17 ENCOUNTER — Other Ambulatory Visit: Payer: Self-pay

## 2020-10-17 ENCOUNTER — Other Ambulatory Visit (INDEPENDENT_AMBULATORY_CARE_PROVIDER_SITE_OTHER): Payer: Self-pay

## 2020-10-17 ENCOUNTER — Ambulatory Visit: Payer: Medicare Other | Admitting: Nurse Practitioner

## 2020-10-17 ENCOUNTER — Encounter: Payer: Self-pay | Admitting: Nurse Practitioner

## 2020-10-17 ENCOUNTER — Telehealth: Payer: Self-pay | Admitting: Neurology

## 2020-10-17 VITALS — BP 130/72 | HR 76 | Ht 70.0 in | Wt 194.0 lb

## 2020-10-17 DIAGNOSIS — I259 Chronic ischemic heart disease, unspecified: Secondary | ICD-10-CM

## 2020-10-17 DIAGNOSIS — I10 Essential (primary) hypertension: Secondary | ICD-10-CM | POA: Diagnosis not present

## 2020-10-17 DIAGNOSIS — I4819 Other persistent atrial fibrillation: Secondary | ICD-10-CM

## 2020-10-17 DIAGNOSIS — Z79899 Other long term (current) drug therapy: Secondary | ICD-10-CM

## 2020-10-17 DIAGNOSIS — Z0289 Encounter for other administrative examinations: Secondary | ICD-10-CM

## 2020-10-17 NOTE — Patient Instructions (Addendum)
After Visit Summary:  We will be checking the following labs today - NONE   Medication Instructions:    Continue with your current medicines.    If you need a refill on your cardiac medications before your next appointment, please call your pharmacy.     Testing/Procedures To Be Arranged:  N/A  Follow-Up:   See Nicki Reaper in about 4 to 6 weeks with EKG.     At [facility], you and your health needs are our priority.  As part of our continuing mission to provide you with exceptional heart care, we have created designated Provider Care Teams.  These Care Teams include your primary Cardiologist (physician) and Advanced Practice Providers (APPs -  Physician Assistants and Nurse Practitioners) who all work together to provide you with the care you need, when you need it.  Special Instructions:  . Stay safe, wash your hands for at least 20 seconds and wear a mask when needed.  . It was good to talk with you today.  . Think about what we talked about today.  . Bring your BP cuff with you   Call the [place] office at [phone] if you have any questions, problems or concerns.

## 2020-10-17 NOTE — Telephone Encounter (Signed)
[employer] medicare order sent to GI. No auth they will reach out to the patient to schedule.  

## 2020-10-18 ENCOUNTER — Telehealth: Payer: Self-pay | Admitting: Neurology

## 2020-10-18 NOTE — Progress Notes (Signed)
Normal CBC, much improved platelet count, red blood cell count.   Share with Dr Philip Aspen

## 2020-10-18 NOTE — Progress Notes (Signed)
Much improved CBC

## 2020-10-18 NOTE — Telephone Encounter (Signed)
Called the patient and advised of the normal lab results. Pt verbalized understanding. Pt had no questions at this time but was encouraged to call back if questions arise.

## 2020-10-18 NOTE — Telephone Encounter (Signed)
-----   Message from Larey Seat, MD sent at 10/18/2020 12:11 PM EST ----- Normal CBC, much improved platelet count, red blood cell count.   Share with Dr Philip Aspen

## 2020-10-19 ENCOUNTER — Encounter: Payer: Self-pay | Admitting: Neurology

## 2020-10-19 ENCOUNTER — Telehealth: Payer: Self-pay | Admitting: Neurology

## 2020-10-19 NOTE — Progress Notes (Signed)
Abnormal CMET , to be shared with PCP. High creatinine level but also likely dehydrated ( BUN is high ).  Normal protein serum e-phoresis, normal CBC and diff, normal TSH, normal B12 level.    Dr Philip Aspen can not be reached directly through epic.

## 2020-10-19 NOTE — Telephone Encounter (Signed)
Called the patient to discuss the labs.  Informed the patient that the BUN and creatinine level was elevated.  Informed the patient that this impacts the kidney function.  Advised that a copy of the report will be forwarded to the patient's primary care doctor for review.  Advised this can also be related to not having plenty of fluids.  Informed the patient to drink plenty of fluids to prepare for the MRI.  Informed of the other lab results being in normal range.  Patient verbalized understanding and had no further questions a copy of the report has been forwarded to the primary care doctor.

## 2020-10-19 NOTE — Telephone Encounter (Signed)
-----   Message from Larey Seat, MD sent at 10/19/2020 12:23 PM EST ----- Abnormal CMET , to be shared with PCP. High creatinine level but also likely dehydrated ( BUN is high ).  Normal protein serum e-phoresis, normal CBC and diff, normal TSH, normal B12 level.    Dr Philip Aspen can not be reached directly through epic.

## 2020-10-20 LAB — CBC WITH DIFFERENTIAL/PLATELET
Basophils Absolute: 0 10*3/uL (ref 0.0–0.2)
Basos: 1 %
EOS (ABSOLUTE): 0.1 10*3/uL (ref 0.0–0.4)
Eos: 1 %
Hematocrit: 40.4 % (ref 37.5–51.0)
Hemoglobin: 13.6 g/dL (ref 13.0–17.7)
Immature Grans (Abs): 0 10*3/uL (ref 0.0–0.1)
Immature Granulocytes: 0 %
Lymphocytes Absolute: 1.4 10*3/uL (ref 0.7–3.1)
Lymphs: 24 %
MCH: 31.6 pg (ref 26.6–33.0)
MCHC: 33.7 g/dL (ref 31.5–35.7)
MCV: 94 fL (ref 79–97)
Monocytes Absolute: 0.5 10*3/uL (ref 0.1–0.9)
Monocytes: 8 %
Neutrophils Absolute: 4 10*3/uL (ref 1.4–7.0)
Neutrophils: 66 %
Platelets: 207 10*3/uL (ref 150–450)
RBC: 4.31 x10E6/uL (ref 4.14–5.80)
RDW: 13.6 % (ref 11.6–15.4)
WBC: 6 10*3/uL (ref 3.4–10.8)

## 2020-10-20 LAB — MULTIPLE MYELOMA PANEL, SERUM
Albumin SerPl Elph-Mcnc: 4.1 g/dL (ref 2.9–4.4)
Albumin/Glob SerPl: 1.5 (ref 0.7–1.7)
Alpha 1: 0.3 g/dL (ref 0.0–0.4)
Alpha2 Glob SerPl Elph-Mcnc: 0.7 g/dL (ref 0.4–1.0)
B-Globulin SerPl Elph-Mcnc: 1 g/dL (ref 0.7–1.3)
Gamma Glob SerPl Elph-Mcnc: 0.8 g/dL (ref 0.4–1.8)
Globulin, Total: 2.8 g/dL (ref 2.2–3.9)
IgA/Immunoglobulin A, Serum: 124 mg/dL (ref 61–437)
IgG (Immunoglobin G), Serum: 848 mg/dL (ref 603–1613)
IgM (Immunoglobulin M), Srm: 23 mg/dL (ref 15–143)

## 2020-10-20 LAB — ANA W/REFLEX: ANA Titer 1: POSITIVE — AB

## 2020-10-20 LAB — TSH: TSH: 2.09 u[IU]/mL (ref 0.450–4.500)

## 2020-10-20 LAB — ENA+DNA/DS+ANTICH+CENTRO+FA...
Anti JO-1: <0.2 AI (ref 0.0–0.9)
Antiribosomal P Antibodies: <0.2 AI (ref 0.0–0.9)
Centromere Ab Screen: <0.2 AI (ref 0.0–0.9)
Chromatin Ab SerPl-aCnc: <0.2 AI (ref 0.0–0.9)
ENA RNP Ab: <0.2 AI (ref 0.0–0.9)
ENA SM Ab Ser-aCnc: <0.2 AI (ref 0.0–0.9)
Homogeneous Pattern: 1:80 {titer}
Scleroderma (Scl-70) (ENA) Antibody, IgG: 0.2 AI (ref 0.0–0.9)
Smith/RNP Antibodies: <0.2 AI (ref 0.0–0.9)
Speckled Pattern: 1:80 {titer}
dsDNA Ab: 3 IU/mL (ref 0–9)

## 2020-10-20 LAB — COMPREHENSIVE METABOLIC PANEL
ALT: 17 IU/L (ref 0–44)
AST: 31 IU/L (ref 0–40)
Albumin/Globulin Ratio: 2.3 — ABNORMAL HIGH (ref 1.2–2.2)
Albumin: 4.8 g/dL — ABNORMAL HIGH (ref 3.6–4.6)
Alkaline Phosphatase: 48 IU/L (ref 44–121)
BUN/Creatinine Ratio: 21 (ref 10–24)
BUN: 30 mg/dL — ABNORMAL HIGH (ref 8–27)
Bilirubin Total: 0.7 mg/dL (ref 0.0–1.2)
CO2: 28 mmol/L (ref 20–29)
Calcium: 9.8 mg/dL (ref 8.6–10.2)
Chloride: 97 mmol/L (ref 96–106)
Creatinine, Ser: 1.43 mg/dL — ABNORMAL HIGH (ref 0.76–1.27)
GFR calc Af Amer: 52 mL/min/{1.73_m2} — ABNORMAL LOW (ref >59)
GFR calc non Af Amer: 45 mL/min/{1.73_m2} — ABNORMAL LOW (ref >59)
Globulin, Total: 2.1 g/dL (ref 1.5–4.5)
Glucose: 88 mg/dL (ref 65–99)
Potassium: 3.8 mmol/L (ref 3.5–5.2)
Sodium: 146 mmol/L — ABNORMAL HIGH (ref 134–144)
Total Protein: 6.9 g/dL (ref 6.0–8.5)

## 2020-10-20 LAB — SEDIMENTATION RATE: Sed Rate: 4 mm/hr (ref 0–30)

## 2020-10-20 LAB — HEMOGLOBIN A1C
Est. average glucose Bld gHb Est-mCnc: 103 mg/dL
Hgb A1c MFr Bld: 5.2 % (ref 4.8–5.6)

## 2020-10-20 LAB — C-REACTIVE PROTEIN: CRP: 3 mg/L (ref 0–10)

## 2020-10-20 LAB — METHYLMALONIC ACID, SERUM: Methylmalonic Acid: 180 nmol/L (ref 0–378)

## 2020-10-20 LAB — SJOGREN'S SYNDROME ANTIBODS(SSA + SSB)
ENA SSA (RO) Ab: <0.2 AI (ref 0.0–0.9)
ENA SSB (LA) Ab: <0.2 AI (ref 0.0–0.9)

## 2020-10-20 LAB — HOMOCYSTEINE: Homocysteine: 12.6 umol/L (ref 0.0–21.3)

## 2020-10-20 LAB — VITAMIN B12: Vitamin B-12: 1152 pg/mL (ref 232–1245)

## 2020-10-21 ENCOUNTER — Ambulatory Visit
Admission: RE | Admit: 2020-10-21 | Discharge: 2020-10-21 | Disposition: A | Discharge status: Home / Self Care | Payer: Medicare Other | Source: Ambulatory Visit | Attending: Neurology | Admitting: Neurology

## 2020-10-21 ENCOUNTER — Other Ambulatory Visit: Payer: Self-pay

## 2020-10-21 DIAGNOSIS — Z9181 History of falling: Secondary | ICD-10-CM

## 2020-10-21 DIAGNOSIS — I4891 Unspecified atrial fibrillation: Secondary | ICD-10-CM

## 2020-10-21 DIAGNOSIS — I951 Orthostatic hypotension: Secondary | ICD-10-CM

## 2020-10-21 DIAGNOSIS — S065XAA Traumatic subdural hemorrhage with loss of consciousness status unknown, initial encounter: Secondary | ICD-10-CM

## 2020-10-21 DIAGNOSIS — J9611 Chronic respiratory failure with hypoxia: Secondary | ICD-10-CM

## 2020-10-21 DIAGNOSIS — S065X9A Traumatic subdural hemorrhage with loss of consciousness of unspecified duration, initial encounter: Secondary | ICD-10-CM

## 2020-10-21 DIAGNOSIS — M6258 Muscle wasting and atrophy, not elsewhere classified, other site: Secondary | ICD-10-CM

## 2020-10-21 DIAGNOSIS — D500 Iron deficiency anemia secondary to blood loss (chronic): Secondary | ICD-10-CM

## 2020-10-22 NOTE — Progress Notes (Signed)
Normal homocysteine, methylmalonic acid/ high normal B 12, normal SSa and SSb, and unspecified ANA positive- awaiting reflex.  Cc Dr Philip Aspen

## 2020-10-23 ENCOUNTER — Telehealth: Payer: Self-pay | Admitting: Neurology

## 2020-10-23 DIAGNOSIS — S065XAA Traumatic subdural hemorrhage with loss of consciousness status unknown, initial encounter: Secondary | ICD-10-CM

## 2020-10-23 DIAGNOSIS — I4891 Unspecified atrial fibrillation: Secondary | ICD-10-CM

## 2020-10-23 DIAGNOSIS — S065X9A Traumatic subdural hemorrhage with loss of consciousness of unspecified duration, initial encounter: Secondary | ICD-10-CM

## 2020-10-23 DIAGNOSIS — D500 Iron deficiency anemia secondary to blood loss (chronic): Secondary | ICD-10-CM

## 2020-10-23 DIAGNOSIS — I951 Orthostatic hypotension: Secondary | ICD-10-CM

## 2020-10-23 DIAGNOSIS — G629 Polyneuropathy, unspecified: Secondary | ICD-10-CM

## 2020-10-23 NOTE — Telephone Encounter (Signed)
I called pt's daughter back. She sts pt went for MRI on 10/21/2020, but could not have the study completed. She sts the pt CANNOT lay flat without wearing his biPAP this was unable to be accommodated.  I advised I would send message to referral coordinator and see if she is aware of any other options.

## 2020-10-23 NOTE — Telephone Encounter (Signed)
SPIGNER,[name](daughter on DPR) has called to report that pt was unable to have his MRI without his Bi-PAP, daughter is asking for a call to discuss.

## 2020-10-23 NOTE — Telephone Encounter (Signed)
I call the pt's daughter back and left a vm.  I advised we would be sending the patient's to [facility], and if they could not accommodate the open MRI for her to call back and we could reassess.

## 2020-10-23 NOTE — Telephone Encounter (Signed)
Hi Megan , Patient may need a open scanner . I will send this Raquel Sarna and see her advise . Raquel Sarna if you need something let me know . Thanks Hinton Dyer .

## 2020-10-23 NOTE — Telephone Encounter (Signed)
He can try the open MRI. [name] can you get Dr. Brett Fairy signature and fax it to [phone]. Phone number [phone]. If the open MRI doesn't work I am not sure if Dr. Brett Fairy would want him to do a sedated MRI at [facility].

## 2020-10-24 ENCOUNTER — Other Ambulatory Visit: Payer: Self-pay

## 2020-10-24 ENCOUNTER — Ambulatory Visit: Payer: Medicare Other | Attending: Physician Assistant | Admitting: Physical Therapy

## 2020-10-24 ENCOUNTER — Encounter: Payer: Self-pay | Admitting: Neurology

## 2020-10-24 ENCOUNTER — Encounter: Payer: Self-pay | Admitting: Physical Therapy

## 2020-10-24 ENCOUNTER — Ambulatory Visit: Payer: Medicare Other | Admitting: Neurology

## 2020-10-24 ENCOUNTER — Ambulatory Visit (INDEPENDENT_AMBULATORY_CARE_PROVIDER_SITE_OTHER): Payer: Medicare Other | Admitting: Neurology

## 2020-10-24 DIAGNOSIS — R2689 Other abnormalities of gait and mobility: Secondary | ICD-10-CM | POA: Diagnosis present

## 2020-10-24 DIAGNOSIS — J9611 Chronic respiratory failure with hypoxia: Secondary | ICD-10-CM

## 2020-10-24 DIAGNOSIS — I951 Orthostatic hypotension: Secondary | ICD-10-CM

## 2020-10-24 DIAGNOSIS — Z9181 History of falling: Secondary | ICD-10-CM | POA: Diagnosis present

## 2020-10-24 DIAGNOSIS — R2681 Unsteadiness on feet: Secondary | ICD-10-CM | POA: Diagnosis present

## 2020-10-24 DIAGNOSIS — R41841 Cognitive communication deficit: Secondary | ICD-10-CM | POA: Insufficient documentation

## 2020-10-24 DIAGNOSIS — S065X9A Traumatic subdural hemorrhage with loss of consciousness of unspecified duration, initial encounter: Secondary | ICD-10-CM

## 2020-10-24 DIAGNOSIS — Z0289 Encounter for other administrative examinations: Secondary | ICD-10-CM

## 2020-10-24 DIAGNOSIS — R41844 Frontal lobe and executive function deficit: Secondary | ICD-10-CM | POA: Diagnosis present

## 2020-10-24 DIAGNOSIS — M4807 Spinal stenosis, lumbosacral region: Secondary | ICD-10-CM

## 2020-10-24 DIAGNOSIS — R278 Other lack of coordination: Secondary | ICD-10-CM | POA: Insufficient documentation

## 2020-10-24 DIAGNOSIS — I4891 Unspecified atrial fibrillation: Secondary | ICD-10-CM

## 2020-10-24 DIAGNOSIS — M6281 Muscle weakness (generalized): Secondary | ICD-10-CM | POA: Insufficient documentation

## 2020-10-24 DIAGNOSIS — S065XAA Traumatic subdural hemorrhage with loss of consciousness status unknown, initial encounter: Secondary | ICD-10-CM

## 2020-10-24 DIAGNOSIS — R41840 Attention and concentration deficit: Secondary | ICD-10-CM | POA: Diagnosis present

## 2020-10-24 DIAGNOSIS — D500 Iron deficiency anemia secondary to blood loss (chronic): Secondary | ICD-10-CM

## 2020-10-24 DIAGNOSIS — M6258 Muscle wasting and atrophy, not elsewhere classified, other site: Secondary | ICD-10-CM

## 2020-10-24 NOTE — Progress Notes (Signed)
Please refer to EMG and nerve conduction procedure note.  

## 2020-10-24 NOTE — Procedures (Signed)
     HISTORY:  [name] is an 87 year old gentleman with a history of prior lumbosacral spine surgery.  He has had some deterioration in his ability to ambulate over the last 6 months, he is walking with a walker.  He has had more weakness of the right leg than the left.  He denies any pain from the back going down the legs on either side.  He is being evaluated for a possible neuropathy or a lumbar radiculopathy.  NERVE CONDUCTION STUDIES:  Nerve conduction studies were performed on both lower extremities.  The distal motor latencies for the peroneal and posterior tibial nerves were within normal limits bilaterally.  The motor amplitudes for these nerves were slightly low with exception that the motor amplitude for the left posterior tibial nerve was normal.  Borderline normal nerve conduction velocities were noted for the peroneal nerves bilaterally and for the left posterior tibial nerve, slightly slow for the right posterior tibial nerve.  The sural and peroneal sensory latencies were within normal limits bilaterally.  The F-wave latencies for the posterior tibial nerves were prolonged bilaterally.  EMG STUDIES:  EMG study was performed on the right lower extremity:  The tibialis anterior muscle reveals 2 to 3K motor units with decreased recruitment. No fibrillations or positive waves were seen. The peroneus tertius muscle reveals 1 to 3K motor units with decreased recruitment. No fibrillations or positive waves were seen. The medial gastrocnemius muscle reveals 1 to 5K motor units with decreased recruitment. 1+ fibrillations and positive waves were seen. The vastus lateralis muscle reveals 2 to 4K motor units with full recruitment. No fibrillations or positive waves were seen. The iliopsoas muscle reveals 2 to 4K motor units with full recruitment. No fibrillations or positive waves were seen. The biceps femoris muscle (long head) reveals 2 to 4K motor units with full recruitment. No  fibrillations or positive waves were seen. The lumbosacral paraspinal muscles were tested at 3 levels, and revealed no abnormalities of insertional activity at all 3 levels tested. There was poor relaxation.   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  EMG evaluation of the right lower extremity shows distal chronic and mild acute neuropathic denervation.  This may be consistent with a motor neuropathy, cannot fully exclude the possibility of an overlying chronic L5 or S1 radiculopathy.  Jill Alexanders MD 10/24/2020 4:43 PM  [facility] [address] [place], [place] [address]  Phone [phone] Fax [phone]

## 2020-10-24 NOTE — Telephone Encounter (Signed)
Yes, I would need to send him to hospital for MRI under sedation. If xanax alone is not enough we need to use conscious sedation in hospital.

## 2020-10-24 NOTE — Therapy (Signed)
[facility] [address] [place], [place], [address] Phone: [phone]   Fax:  [phone]  Physical Therapy Evaluation  Patient Details  Name: [name] MRN: [id number] Date of Birth: July 24, 1934 Referring Provider (PT): Dohmeier, Asencion Partridge, MD (is being followed by, was referred by hospitalist)   Encounter Date: 10/24/2020   PT End of Session - 10/24/20 1545    Visit Number 1    Number of Visits 17    Date for PT Re-Evaluation 01/22/21   written for 60 day POC   Authorization Type [employer] Medicare    PT Start Time 1444    PT Stop Time 1530    PT Time Calculation (min) 46 min    Equipment Utilized During Treatment Gait belt    Activity Tolerance Patient tolerated treatment well    Behavior During Therapy [facility] for tasks assessed/performed           Past Medical History:  Diagnosis Date  . Afib ([place])   . Arthritis   . BPH (benign prostatic hyperplasia)   . COPD (chronic obstructive pulmonary disease) ([place])   . Coronary artery disease   . Coronary artery ectasia    Mild CAD with normal systolic function per cath in August of 2011  . Depression   . Diaphragmatic paralysis    felt to be partially responsible for dyspnea  . Dyspnea    due to paralyzed diaphragm and pulmonary issues  . Dysrhythmia   . GERD (gastroesophageal reflux disease)   . Gout   . HTN (hypertension)   . Hypercholesteremia   . Hypertension   . Iron deficiency anemia   . Microhematuria   . Obesity   . OSA (obstructive sleep apnea)    CPAP  . Paroxysmal atrial fibrillation ([facility])    occured in the setting of acute E Coli sepsis and ileius 7/12  . PVC (premature ventricular contraction)    s/p PVC ablation 05/29/2010    Past Surgical History:  Procedure Laterality Date  . APPENDECTOMY  1978  . BIOPSY  02/09/2019   Procedure: BIOPSY;  Surgeon: Clarene Essex, MD;  Location: [facility];  Service: Endoscopy;;  . BIOPSY  03/02/2019    Procedure: BIOPSY;  Surgeon: Clarene Essex, MD;  Location: [facility];  Service: Endoscopy;;  . CARDIAC CATHETERIZATION  04-30-2010   Left main coronary artery is normal.   . CARDIOVASCULAR STRESS TEST  03-19-2010   0%  . CHOLECYSTECTOMY  1990  . COLONOSCOPY N/A 03/02/2019   Procedure: COLONOSCOPY;  Surgeon: Clarene Essex, MD;  Location: [facility];  Service: Endoscopy;  Laterality: N/A;  . COLONOSCOPY W/ POLYPECTOMY    . ESOPHAGOGASTRODUODENOSCOPY (EGD) WITH PROPOFOL N/A 02/09/2019   Procedure: ESOPHAGOGASTRODUODENOSCOPY (EGD) WITH PROPOFOL;  Surgeon: Clarene Essex, MD;  Location: [facility];  Service: Endoscopy;  Laterality: N/A;  . EYE SURGERY Right    catarct  . poly removed from nose as a child    . PVC ablation  05/29/2010  . US ECHOCARDIOGRAPHY  03-09-2010   EF 55-60%    There were no vitals filed for this visit.    Subjective Assessment - 10/24/20 1447    Subjective Pt had a fall while shopping in [employer] on 08/15/20. Pt sustained a parafalcine and tentorial SDH. Was discharged from the hospital on 08/17/20. Has been going to PT at another clinic for PT 1-2x week for the past couple of weeks.   Now using a rollator at all times. Prior to hospitalization was using  cane for all gait.  Reports the walking and balance is not good at all. Has had 2 other falls since hospitalization - just lost his balance when he was using a rollator (he thinks he was using it). Pt did not hurt himself with those falls.    Patient is accompained by: Family member   wife, [name]   Pertinent History HTN, CAD, COPD, A-fib    Limitations Standing;Walking    Patient Stated Goals wants to improve his balance    Currently in Pain? No/denies              [facility] PT Assessment - 10/24/20 1505      Assessment   Medical Diagnosis Subdural hematoma    Referring Provider (PT) Dohmeier, Asencion Partridge, MD   is being followed by, was referred by hospitalist   Onset Date/Surgical Date 08/15/20    Hand Dominance Right    Prior  Therapy PT recently at outpatient ortho for strength/balance      Precautions   Precautions Fall    Precaution Comments can't lay down flat on back      Balance Screen   Has the patient fallen in the past 6 months Yes    How many times? 6   one time trying to put pants on   Has the patient had a decrease in activity level because of a fear of falling?  Yes    Is the patient reluctant to leave their home because of a fear of falling?  Yes   only if he is with his wife     Chief Technology Officer residence    Living Arrangements Spouse/significant other    Type of [place] to enter    [employer] of Steps 3    Entrance Stairs-Rails Can reach both    [place];Other (Comment)   3 levels   Home Equipment Other (comment);Walker - 4 wheels;Grab bars - toilet;Grab bars - tub/shower;Shower seat   rollator, SPC with quad tip, does not use a shower seat, has a tub shower   Additional Comments takes the stair lift up stairs - uses his rollator downstairs and uses a cane upstairs (reports there is not a lot of space to maneuver upstairs)      Prior Function   Level of Independence Independent    Leisure watching TV      Cognition   Overall Cognitive Status Impaired/Different from baseline   reports difficulty with memory after SDH     Sensation   Light Touch Appears Intact    Additional Comments able to detect light touch, reports some tingling when lying down      Coordination   Gross Motor Movements are Fluid and Coordinated No    Heel Shin Test slowed with LLE, unable to perform with RLE due to weakness      ROM / Strength   AROM / PROM / Strength Strength;AROM      AROM   Overall AROM Comments limited AROM due to weakness      Strength   Strength Assessment Site Hip;Knee;Ankle    Right/Left Hip Right;Left    Right Hip Flexion 2+/5    Left Hip Flexion 3-/5    Right/Left Knee Right;Left    Right  Knee Flexion 4/5    Right Knee Extension 4+/5    Left Knee Flexion 4/5    Left Knee Extension 4+/5  Right/Left Ankle Right;Left    Right Ankle Dorsiflexion 4/5    Left Ankle Dorsiflexion 4-/5      Transfers   Transfers Sit to Stand;Stand to Sit    Sit to Stand 5: Supervision;With upper extremity assist;From chair/3-in-1    Five time sit to stand comments  19.31 seconds    Stand to Sit 5: Supervision;With upper extremity assist;To chair/3-in-1    Comments pt needs cues to fully back up to chair and feel BLE on chair before sitting down, cues to lock rollator prior to sitting      Ambulation/Gait   Ambulation/Gait Yes    Ambulation/Gait Assistance 5: Supervision    Ambulation Distance (Feet) --   clinic distances   Assistive device Rollator    Gait Pattern Step-through pattern;Decreased hip/knee flexion - left;Decreased hip/knee flexion - right;Decreased dorsiflexion - right;Decreased dorsiflexion - left    Ambulation Surface Level;Indoor    Gait velocity 14.98 seconds = 2.18 ft/sec    Gait Comments discussed pt having a rollator to use upstairs as pt only having cane with quad tip and pt has had a couple falls upstairs since being home from the hospital      Standardized Balance Assessment   Standardized Balance Assessment Timed Up and Go Test      Timed Up and Go Test   Normal TUG (seconds) 16.94    TUG Comments with rollator                      Objective measurements completed on examination: See above findings.               PT Education - 10/24/20 1541    Education Details clinical findings, POC    Person(s) Educated Patient;Spouse    Methods Explanation    Comprehension Verbalized understanding            PT Short Term Goals - 10/24/20 1551      PT SHORT TERM GOAL #1   Title Pt will initiate HEP in order to indicate improved functional mobility and decreased fall risk.  ALL STGS DUE 11/21/20    Time 4    Period Weeks    Status New     [place] Date 11/21/20      PT SHORT TERM GOAL #2   Title Pt will perform 5x sit <> stand in 17 seconds or less with BUE support in order to demo improved functional strength.    Baseline 19.31 seconds    Time 4    Period Weeks    Status New      PT SHORT TERM GOAL #3   Title Pt will undergo BERG with STG and LTG written as appropriate.    Time 4    Period Weeks    Status New      PT SHORT TERM GOAL #4   Title Pt will improve gait speed with rollator to at least 2.5 ft/sec in order demo improved community mobility.    Baseline 2.18 ft/sec    Time 4    Period Weeks    Status New      PT SHORT TERM GOAL #5   Title Pt and pt's spouse will verbalize understanding of fall prevention in the home to decr fall risk.    Time 4    Period Weeks    Status New             PT Long Term Goals - 10/24/20 1557  PT LONG TERM GOAL #1   Title Pt will be independent with final HEP in order to indicate improved functional mobility and decreased fall risk.  ALL LTGS DUE 12/19/20    Time 8    Period Weeks    Status New    Target Date 12/19/20      PT LONG TERM GOAL #2   Title BERG goal to be written as appropriate to determine decr fall risk.    Time 8    Period Weeks    Status New      PT LONG TERM GOAL #3   Title Pt will perform 5x sit <> stand in 14.5 seconds or less with BUE vs. single UE support in order to demo improved functional strength.    Baseline 19.31 seconds    Time 8    Period Weeks    Status New      PT LONG TERM GOAL #4   Title Pt will ambulate at least 150' over indoor surfaces with cane vs. LRAD with supervision in order to demo improved household mobility.    Time 8    Period Weeks    Status New      PT LONG TERM GOAL #5   Title Pt will perform TUG in 14.5 seconds or less with rollator vs. LRAD in order to demo decr fall risk.    Baseline 16.94 seconds with rollator.    Time 8    Period Weeks    Status New                  Plan - 10/24/20  1603    Personal Factors and Comorbidities Comorbidity 3+;Past/Current Experience    Comorbidities HTN, CAD, COPD, A-fib, hx of COVID march 2021    Examination-Activity Limitations Stand;Squat;Transfers;Reach Overhead;Locomotion Level    Examination-Participation Restrictions Community Activity;Driving           Patient will benefit from skilled therapeutic intervention in order to improve the following deficits and impairments:  Abnormal gait,Decreased activity tolerance,Decreased balance,Decreased endurance,Decreased coordination,Decreased cognition,Decreased knowledge of use of DME,Decreased range of motion,Difficulty walking,Decreased safety awareness,Decreased strength  Visit Diagnosis: Unsteadiness on feet  History of falling  Other abnormalities of gait and mobility  Muscle weakness (generalized)     Problem List Patient Active Problem List   Diagnosis Date Noted  . Subdural hematoma ([place]) 08/15/2020  . Iron deficiency anemia due to chronic blood loss 01/06/2019  . Orthostatic hypotension 01/05/2019    Class: Question of  . Normocytic normochromic anemia 01/05/2019  . Near syncope 01/05/2019  . Chronic respiratory failure with hypoxia ([place]) 06/30/2018  . OSA (obstructive sleep apnea) 02/11/2018  . Physical deconditioning 08/11/2017  . Shortness of breath 10/24/2014  . COPD (chronic obstructive pulmonary disease) with emphysema ([place]) 09/01/2014  . Fatigue 10/14/2011  . Atrial fibrillation ([place]) 05/15/2011  . Premature ventricular contractions (PVCs) (VPCs) 02/25/2011  . 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  . PERSISTENT DISORDER INITIATING/MAINTAINING SLEEP 05/10/2010  . HYPERCHOLESTEROLEMIA 05/09/2010  . GOUT 05/09/2010  . OBESITY 05/09/2010  . Obstructive sleep apnea 05/09/2010  . Essential hypertension 05/09/2010  . VENTRICULAR TACHYCARDIA 05/09/2010    Arliss Journey, PT, DPT  10/24/2020, 4:07 PM  [facility] [address] [place], [place], [address] Phone: [phone]   Fax:  [phone]  Name: [name] MRN: [id number] Date of Birth: 1934/06/29

## 2020-10-24 NOTE — Progress Notes (Signed)
   [name] is an 87 year old gentleman with a history of prior lumbosacral spine surgery.  He has had some deterioration in his ability to ambulate over the last 6 months, he is walking with a walker.  He has had more weakness of the right leg than the left.  He denies any pain from the back going down the legs on either side.  He is being evaluated for a possible neuropathy or a lumbar radiculopathy.  Result. 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   EMG evaluation of the right lower extremity shows distal chronic and mild acute neuropathic denervation.  This may be consistent with a motor neuropathy, I cannot fully exclude the possibility of an overlying chronic L5 or S1 radiculopathy.

## 2020-10-24 NOTE — Telephone Encounter (Signed)
[name] can't do The MRI I called and spoke to Pink and she relayed Bi -pap would have to come off . [name] can give Him Oxygen .   Courtney Thinks Maybe sedation ? Please advise .   Spoke to patient's wife and relayed Raquel Sarna is out of the office until Tommrw . 10/25/2020. Please Let me know what I can do

## 2020-10-25 ENCOUNTER — Other Ambulatory Visit: Payer: Self-pay | Admitting: Neurology

## 2020-10-25 NOTE — Addendum Note (Signed)
Addended by: Darleen Crocker on: 10/25/2020 08:45 AM   Modules accepted: Orders

## 2020-10-25 NOTE — Addendum Note (Signed)
Addended by: Darleen Crocker on: 10/25/2020 08:46 AM   Modules accepted: Orders

## 2020-10-25 NOTE — Telephone Encounter (Signed)
[name] and spoke to patient's wife and relayed that you would call her to schedule when you could for MRI  She want's you to call her on this number . [phone]. Thanks Hinton Dyer

## 2020-10-25 NOTE — Telephone Encounter (Signed)
sedated MRI for [facility] to [facility] to reach out to patient to schedule. Their phone number is [phone].

## 2020-10-25 NOTE — Telephone Encounter (Signed)
I have corrected the order to reflect the patient having this completed under sedation at [facility].

## 2020-10-26 NOTE — Telephone Encounter (Signed)
Patient is scheduled at [facility] for 11/07/20.

## 2020-10-27 ENCOUNTER — Ambulatory Visit: Payer: Medicare Other

## 2020-10-27 ENCOUNTER — Other Ambulatory Visit: Payer: Self-pay

## 2020-10-27 DIAGNOSIS — R2681 Unsteadiness on feet: Secondary | ICD-10-CM | POA: Diagnosis not present

## 2020-10-27 DIAGNOSIS — M6281 Muscle weakness (generalized): Secondary | ICD-10-CM

## 2020-10-27 NOTE — Patient Instructions (Signed)
Access Code: [email or web address] URL: [email or web address] Date: 10/27/2020 Prepared by: Sharlynn Oliphant  Exercises Seated Long Arc Quad - 2 x daily - 5 x weekly - 2 sets - 15 reps Seated March - 2 x daily - 5 x weekly - 2 sets - 15 reps Seated Heel Slide with Internal Rotation - 2 x daily - 5 x weekly - 2 sets - 15 reps Standing Toe Raises at Chair - 2 x daily - 5 x weekly - 2 sets - 15 reps Standing Heel Raise with Chair Support - 2 x daily - 5 x weekly - 2 sets - 15 reps

## 2020-10-28 NOTE — Therapy (Addendum)
[facility] [address] [place], [place], [address] Phone: [phone]   Fax:  [phone]  Physical Therapy Treatment  Patient Details  Name: [name] MRN: [id number] Date of Birth: 1934/02/27 Referring Provider (PT): Dohmeier, Asencion Partridge, MD (is being followed by, was referred by hospitalist)   Encounter Date: 10/27/2020   PT End of Session - 10/27/20 1818    Visit Number 2    Number of Visits 17    Date for PT Re-Evaluation 01/22/21   written for 60 day POC   Authorization Type UHC Medicare    PT Start Time 2725    PT Stop Time 1615    PT Time Calculation (min) 45 min    Equipment Utilized During Treatment Gait belt    Activity Tolerance Patient tolerated treatment well    Behavior During Therapy [facility] for tasks assessed/performed           Past Medical History:  Diagnosis Date  . Afib ([place])   . Arthritis   . BPH (benign prostatic hyperplasia)   . COPD (chronic obstructive pulmonary disease) ([place])   . Coronary artery disease   . Coronary artery ectasia    Mild CAD with normal systolic function per cath in August of 2011  . Depression   . Diaphragmatic paralysis    felt to be partially responsible for dyspnea  . Dyspnea    due to paralyzed diaphragm and pulmonary issues  . Dysrhythmia   . GERD (gastroesophageal reflux disease)   . Gout   . HTN (hypertension)   . Hypercholesteremia   . Hypertension   . Iron deficiency anemia   . Microhematuria   . Obesity   . OSA (obstructive sleep apnea)    CPAP  . Paroxysmal atrial fibrillation (HCC)    occured in the setting of acute E Coli sepsis and ileius 7/12  . PVC (premature ventricular contraction)    s/p PVC ablation 05/29/2010    Past Surgical History:  Procedure Laterality Date  . APPENDECTOMY  1978  . BIOPSY  02/09/2019   Procedure: BIOPSY;  Surgeon: Clarene Essex, MD;  Location: [facility];  Service: Endoscopy;;  . BIOPSY  03/02/2019    Procedure: BIOPSY;  Surgeon: Clarene Essex, MD;  Location: [facility];  Service: Endoscopy;;  . CARDIAC CATHETERIZATION  04-30-2010   Left main coronary artery is normal.   . CARDIOVASCULAR STRESS TEST  03-19-2010   0%  . CHOLECYSTECTOMY  1990  . COLONOSCOPY N/A 03/02/2019   Procedure: COLONOSCOPY;  Surgeon: Clarene Essex, MD;  [facility];  Service: Endoscopy;  Laterality: N/A;  . COLONOSCOPY W/ POLYPECTOMY    . ESOPHAGOGASTRODUODENOSCOPY (EGD) WITH PROPOFOL N/A 02/09/2019   Procedure: ESOPHAGOGASTRODUODENOSCOPY (EGD) WITH PROPOFOL;  Surgeon: Clarene Essex, MD;  [facility];  Service: Endoscopy;  Laterality: N/A;  . EYE SURGERY Right    catarct  . poly removed from nose as a child    . PVC ablation  05/29/2010  . US ECHOCARDIOGRAPHY  03-09-2010   EF 55-60%    There were no vitals filed for this visit.   Subjective Assessment - 10/27/20 1816    Subjective No falls, pain or medical changes to report, no SOB cited    Patient is accompained by: Family member   wife, [name]   Pertinent History HTN, CAD, COPD, A-fib    Limitations Standing;Walking    Patient Stated Goals wants to improve his balance  10/27/20 0001  Transfers  Transfers Sit to Stand;Stand to Sit  Sit to Stand 6: Modified independent (Device/Increase time)  Comments marked need of UE assist  Berg Balance Test  Sit to Stand 2  Standing Unsupported 3  Sitting with Back Unsupported but Feet Supported on Floor or Stool 4  Stand to Sit 3  Transfers 3  Standing Unsupported with Eyes Closed 4  Standing Unsupported with Feet Together 3  From Standing, Reach Forward with Outstretched Arm 3  From Standing Position, Pick up Object from Floor 3  From Standing Position, Turn to Look Behind Over each Shoulder 3  Turn 360 Degrees 2  Standing Unsupported, Alternately Place Feet on Step/Stool 0  Standing Unsupported, One Foot in Front 3  Standing on One Leg 1  Total Score 37       10/27/20 0001   Transfers  Transfers Sit to Stand;Stand to Sit  Sit to Stand 6: Modified independent (Device/Increase time)  Comments marked need of UE assist  Knee/Hip Exercises: Aerobic  Nustep 6' L2  Knee/Hip Exercises: Seated  Long Arc Quad Strengthening;Both;2 sets;15 reps (decreased RLE mobility due to old spinal surgery)  Heel Slides Strengthening;Both;2 sets;15 reps  Heel Slides Limitations seated  Marching Strengthening;Both;2 sets;15 reps  Marching Limitations decreased RLE mobility due to old spinal surgery  Ankle Exercises: Standing  Heel Raises Both;15 reps (2 sets)  Heel Raises Limitations performed in walker with BUEsupport  Toe Raise 15 reps  Toe Raise Limitations perormed in rollator with BUE support                          PT Education - 10/28/20 1818    Education Details instructed in above medbridge program    Person(s) Educated Patient    Methods Explanation;Demonstration;Handout    Comprehension Verbalized understanding;Returned demonstration            PT Short Term Goals - 10/24/20 1551      PT SHORT TERM GOAL #1   Title Pt will initiate HEP in order to indicate improved functional mobility and decreased fall risk.  ALL STGS DUE 11/21/20    Time 4    Period Weeks    Status New    Target Date 11/21/20      PT SHORT TERM GOAL #2   Title Pt will perform 5x sit <> stand in 17 seconds or less with BUE support in order to demo improved functional strength.    Baseline 19.31 seconds    Time 4    Period Weeks    Status New      PT SHORT TERM GOAL #3   Title Pt will undergo BERG with STG and LTG written as appropriate.    Time 4    Period Weeks    Status New      PT SHORT TERM GOAL #4   Title Pt will improve gait speed with rollator to at least 2.5 ft/sec in order demo improved community mobility.    Baseline 2.18 ft/sec    Time 4    Period Weeks    Status New      PT SHORT TERM GOAL #5   Title Pt and pt's spouse will verbalize  understanding of fall prevention in the home to decr fall risk.    Time 4    Period Weeks    Status New             PT Long Term Goals -  10/27/20 1827      PT LONG TERM GOAL #1   Title Pt will be independent with final HEP in order to indicate improved functional mobility and decreased fall risk.  ALL LTGS DUE 12/19/20    Time 8    Period Weeks    Status New      PT LONG TERM GOAL #2   Title BERG goal to be written as appropriate to determine decr fall risk.  BERG goal is 45/56    Baseline 37    Time 8    Period Weeks    Status New      PT LONG TERM GOAL #3   Title Pt will perform 5x sit <> stand in 14.5 seconds or less with BUE vs. single UE support in order to demo improved functional strength.    Baseline 19.31 seconds    Time 8    Period Weeks    Status New      PT LONG TERM GOAL #4   Title Pt will ambulate at least 150' over indoor surfaces with cane vs. LRAD with supervision in order to demo improved household mobility.    Time 8    Period Weeks    Status New      PT LONG TERM GOAL #5   Title Pt will perform TUG in 14.5 seconds or less with rollator vs. LRAD in order to demo decr fall risk.    Baseline 16.94 seconds with rollator.    Time 8    Period Weeks    Status New                 Plan - 10/27/20 1819    Clinical Impression Statement continues to demo BLE weakness and unable to transfer without definite need of UEs, began LE strength and endurance training, established HEP and issued handout, continues to require rollator to ambulate, assessed BERG with score of 37 noted    Personal Factors and Comorbidities Comorbidity 3+;Past/Current Experience    Comorbidities HTN, CAD, COPD, A-fib, hx of COVID march 2021    Examination-Activity Limitations Stand;Squat;Transfers;Reach Overhead;Locomotion Level    Examination-Participation Restrictions Community Activity;Driving    Stability/Clinical Decision Making Evolving/Moderate complexity    Rehab  Potential Good    PT Frequency 2x / week    PT Duration 8 weeks    PT Treatment/Interventions ADLs/Self Care Home Management;DME Instruction;Gait training;Functional mobility training;Stair training;Neuromuscular re-education;Balance training;Therapeutic exercise;Therapeutic activities;Patient/family education;Vestibular;Energy conservation    PT Next Visit Plan f/u with HEP, continue LE strength and endurance training, add balance tasks both static and dynamic, assess need for supplemental O2    PT Home Exercise Plan see medbridge notes    Consulted and Agree with Plan of Care Patient           Patient will benefit from skilled therapeutic intervention in order to improve the following deficits and impairments:  Abnormal gait,Decreased activity tolerance,Decreased balance,Decreased endurance,Decreased coordination,Decreased cognition,Decreased knowledge of use of DME,Decreased range of motion,Difficulty walking,Decreased safety awareness,Decreased strength  Visit Diagnosis: Unsteadiness on feet  Muscle weakness (generalized)     Problem List Patient Active Problem List   Diagnosis Date Noted  . Subdural hematoma ([place]) 08/15/2020  . Iron deficiency anemia due to chronic blood loss 01/06/2019  . Orthostatic hypotension 01/05/2019    Class: Question of  . Normocytic normochromic anemia 01/05/2019  . Near syncope 01/05/2019  . Chronic respiratory failure with hypoxia ([place]) 06/30/2018  . OSA (obstructive sleep apnea) 02/11/2018  . Physical deconditioning  08/11/2017  . Shortness of breath 10/24/2014  . COPD (chronic obstructive pulmonary disease) with emphysema ([place]) 09/01/2014  . Fatigue 10/14/2011  . Atrial fibrillation ([place]) 05/15/2011  . Premature ventricular contractions (PVCs) (VPCs) 02/25/2011  . 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  . PERSISTENT DISORDER INITIATING/MAINTAINING SLEEP 05/10/2010  . HYPERCHOLESTEROLEMIA 05/09/2010  . GOUT 05/09/2010  . OBESITY 05/09/2010  .  Obstructive sleep apnea 05/09/2010  . Essential hypertension 05/09/2010  . VENTRICULAR TACHYCARDIA 05/09/2010    Lanice Shirts PT 10/28/2020, 6:28 PM  [facility] [address] [place], [place], [address] Phone: [phone]   Fax:  [phone]  Name: [name] MRN: [id number] Date of Birth: [birth year]

## 2020-10-30 ENCOUNTER — Other Ambulatory Visit: Payer: Medicare Other

## 2020-10-31 ENCOUNTER — Other Ambulatory Visit: Payer: Self-pay

## 2020-10-31 ENCOUNTER — Ambulatory Visit: Payer: Medicare Other

## 2020-10-31 ENCOUNTER — Ambulatory Visit: Payer: Medicare Other | Admitting: Occupational Therapy

## 2020-10-31 VITALS — HR 92

## 2020-10-31 DIAGNOSIS — R2681 Unsteadiness on feet: Secondary | ICD-10-CM

## 2020-10-31 DIAGNOSIS — M6281 Muscle weakness (generalized): Secondary | ICD-10-CM

## 2020-10-31 DIAGNOSIS — R41841 Cognitive communication deficit: Secondary | ICD-10-CM

## 2020-10-31 DIAGNOSIS — R41840 Attention and concentration deficit: Secondary | ICD-10-CM

## 2020-10-31 DIAGNOSIS — R278 Other lack of coordination: Secondary | ICD-10-CM

## 2020-10-31 DIAGNOSIS — R41844 Frontal lobe and executive function deficit: Secondary | ICD-10-CM

## 2020-10-31 DIAGNOSIS — R2689 Other abnormalities of gait and mobility: Secondary | ICD-10-CM

## 2020-10-31 NOTE — Telephone Encounter (Signed)
MRI with conscious sedation? Or just anxiolytic medication. I would agree that we should get an image of his brain, so we sedate him and can do it at [facility] or at [facility].

## 2020-10-31 NOTE — Therapy (Signed)
[facility] [address] [place], [place], [address] Phone: [phone]   Fax:  [phone]  Physical Therapy Treatment  Patient Details  Name: [name] MRN: [id number] Date of Birth: 03/13/1934 Referring Provider (PT): Dohmeier, Asencion Partridge, MD (is being followed by, was referred by hospitalist)   Encounter Date: 10/31/2020   PT End of Session - 10/31/20 1648    Visit Number 3    Number of Visits 17    Date for PT Re-Evaluation 01/22/21   written for 60 day POC   Authorization Type UHC Medicare    PT Start Time 7425    PT Stop Time 1530    PT Time Calculation (min) 45 min    Equipment Utilized During Treatment Gait belt    Activity Tolerance Patient tolerated treatment well    Behavior During Therapy [facility] for tasks assessed/performed           Past Medical History:  Diagnosis Date  . Afib ([place])   . Arthritis   . BPH (benign prostatic hyperplasia)   . COPD (chronic obstructive pulmonary disease) ([place])   . Coronary artery disease   . Coronary artery ectasia    Mild CAD with normal systolic function per cath in August of 2011  . Depression   . Diaphragmatic paralysis    felt to be partially responsible for dyspnea  . Dyspnea    due to paralyzed diaphragm and pulmonary issues  . Dysrhythmia   . GERD (gastroesophageal reflux disease)   . Gout   . HTN (hypertension)   . Hypercholesteremia   . Hypertension   . Iron deficiency anemia   . Microhematuria   . Obesity   . OSA (obstructive sleep apnea)    CPAP  . Paroxysmal atrial fibrillation (HCC)    occured in the setting of acute E Coli sepsis and ileius 7/12  . PVC (premature ventricular contraction)    s/p PVC ablation 05/29/2010    Past Surgical History:  Procedure Laterality Date  . APPENDECTOMY  1978  . BIOPSY  02/09/2019   Procedure: BIOPSY;  Surgeon: Clarene Essex, MD;  [facility];  Service: Endoscopy;;  . BIOPSY  03/02/2019    Procedure: BIOPSY;  Surgeon: Clarene Essex, MD;  [facility];  Service: Endoscopy;;  . CARDIAC CATHETERIZATION  04-30-2010   Left main coronary artery is normal.   . CARDIOVASCULAR STRESS TEST  03-19-2010   0%  . CHOLECYSTECTOMY  1990  . COLONOSCOPY N/A 03/02/2019   Procedure: COLONOSCOPY;  Surgeon: Clarene Essex, MD;  [facility];  Service: Endoscopy;  Laterality: N/A;  . COLONOSCOPY W/ POLYPECTOMY    . ESOPHAGOGASTRODUODENOSCOPY (EGD) WITH PROPOFOL N/A 02/09/2019   Procedure: ESOPHAGOGASTRODUODENOSCOPY (EGD) WITH PROPOFOL;  Surgeon: Clarene Essex, MD;  [facility];  Service: Endoscopy;  Laterality: N/A;  . EYE SURGERY Right    catarct  . poly removed from nose as a child    . PVC ablation  05/29/2010  . US ECHOCARDIOGRAPHY  03-09-2010   EF 55-60%    Vitals:   10/31/20 1445 10/31/20 1500  Pulse: 68 92  SpO2: 96% 97%     Subjective Assessment - 10/31/20 1452    Subjective No falls, pain or medical changes to report, no SOB cited, has brought portable O2 today just in case    Patient is accompained by: Family member   wife, [name]   Pertinent History HTN, CAD, COPD, A-fib    Limitations Standing;Walking  Patient Stated Goals wants to improve his balance    Currently in Pain? No/denies                             [facility] Adult PT Treatment/Exercise - 10/31/20 0001      Transfers   Transfers Sit to Stand    Sit to Stand 6: Modified independent (Device/Increase time)    Comments less need of UE support      Ambulation/Gait   Ambulation/Gait Yes    Ambulation/Gait Assistance 4: Min guard    Ambulation Distance (Feet) 230 Feet    Assistive device Straight cane    Gait Pattern Step-through pattern    Ambulation Surface Level;Indoor    Gait Comments assessed ability to step over orange stripe when ambulating in clinic      Knee/Hip Exercises: Aerobic   Nustep 7' at L2               Balance Exercises - 10/31/20 0001       Balance Exercises: Standing   Standing Eyes Opened Narrow base of support (BOS);Head turns;Foam/compliant surface;2 reps;30 secs    Standing Eyes Opened Time 30s x2    Standing Eyes Opened Limitations single UE support at counter on airex, addedd head turn and nods 1x10    Tandem Gait Forward;Upper extremity support;5 reps;Limitations    Tandem Gait Limitations performed at counter    Retro Gait Upper extremity support;5 reps;Limitations    Retro Gait Limitations performed at counter, single UE support, 5 trips    Sidestepping 5 reps;Limitations    Sidestepping Limitations performed at counter with single UE support               PT Short Term Goals - 10/24/20 1551      PT SHORT TERM GOAL #1   Title Pt will initiate HEP in order to indicate improved functional mobility and decreased fall risk.  ALL STGS DUE 11/21/20    Time 4    Period Weeks    Status New    Target Date 11/21/20      PT SHORT TERM GOAL #2   Title Pt will perform 5x sit <> stand in 17 seconds or less with BUE support in order to demo improved functional strength.    Baseline 19.31 seconds    Time 4    Period Weeks    Status New      PT SHORT TERM GOAL #3   Title Pt will undergo BERG with STG and LTG written as appropriate.    Time 4    Period Weeks    Status New      PT SHORT TERM GOAL #4   Title Pt will improve gait speed with rollator to at least 2.5 ft/sec in order demo improved community mobility.    Baseline 2.18 ft/sec    Time 4    Period Weeks    Status New      PT SHORT TERM GOAL #5   Title Pt and pt's spouse will verbalize understanding of fall prevention in the home to decr fall risk.    Time 4    Period Weeks    Status New             PT Long Term Goals - 10/27/20 1827      PT LONG TERM GOAL #1   Title Pt will be independent with final HEP in order to indicate improved functional  mobility and decreased fall risk.  ALL LTGS DUE 12/19/20    Time 8    Period Weeks    Status New       PT LONG TERM GOAL #2   Title BERG goal to be written as appropriate to determine decr fall risk.  BERG goal is 45/56    Baseline 37    Time 8    Period Weeks    Status New      PT LONG TERM GOAL #3   Title Pt will perform 5x sit <> stand in 14.5 seconds or less with BUE vs. single UE support in order to demo improved functional strength.    Baseline 19.31 seconds    Time 8    Period Weeks    Status New      PT LONG TERM GOAL #4   Title Pt will ambulate at least 150' over indoor surfaces with cane vs. LRAD with supervision in order to demo improved household mobility.    Time 8    Period Weeks    Status New      PT LONG TERM GOAL #5   Title Pt will perform TUG in 14.5 seconds or less with rollator vs. LRAD in order to demo decr fall risk.    Baseline 16.94 seconds with rollator.    Time 8    Period Weeks    Status New                 Plan - 10/31/20 1648    Clinical Impression Statement showing increased LE strength as evidenced by less effort needed to STS transfer, no pain reported today, has brought portable O2, no SOB noted, feels falls on second floor of home are related to clutter vs. LOB    Personal Factors and Comorbidities Comorbidity 3+;Past/Current Experience    Comorbidities HTN, CAD, COPD, A-fib, hx of COVID march 2021    Examination-Activity Limitations Stand;Squat;Transfers;Reach Overhead;Locomotion Level    Examination-Participation Restrictions Community Activity;Driving    Stability/Clinical Decision Making Evolving/Moderate complexity    Rehab Potential Good    PT Frequency 2x / week    PT Duration 8 weeks    PT Treatment/Interventions ADLs/Self Care Home Management;DME Instruction;Gait training;Functional mobility training;Stair training;Neuromuscular re-education;Balance training;Therapeutic exercise;Therapeutic activities;Patient/family education;Vestibular;Energy conservation    PT Next Visit Plan assess gait with cane to resolve fall issues at  home, advance strength and balance exercises as tolerated monitor fatgue/SOB/O2 sats as needed, f/u withHEP compliance    Consulted and Agree with Plan of Care Patient           Patient will benefit from skilled therapeutic intervention in order to improve the following deficits and impairments:  Abnormal gait,Decreased activity tolerance,Decreased balance,Decreased endurance,Decreased coordination,Decreased cognition,Decreased knowledge of use of DME,Decreased range of motion,Difficulty walking,Decreased safety awareness,Decreased strength  Visit Diagnosis: Unsteadiness on feet  Muscle weakness (generalized)     Problem List Patient Active Problem List   Diagnosis Date Noted  . Subdural hematoma ([place]) 08/15/2020  . Iron deficiency anemia due to chronic blood loss 01/06/2019  . Orthostatic hypotension 01/05/2019    Class: Question of  . Normocytic normochromic anemia 01/05/2019  . Near syncope 01/05/2019  . Chronic respiratory failure with hypoxia ([place]) 06/30/2018  . OSA (obstructive sleep apnea) 02/11/2018  . Physical deconditioning 08/11/2017  . Shortness of breath 10/24/2014  . COPD (chronic obstructive pulmonary disease) with emphysema ([place]) 09/01/2014  . Fatigue 10/14/2011  . Atrial fibrillation ([place] [address]) 05/15/2011  . Premature ventricular contractions (PVCs) (  [facility]) 02/25/2011  . 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  . PERSISTENT DISORDER INITIATING/MAINTAINING SLEEP 05/10/2010  . HYPERCHOLESTEROLEMIA 05/09/2010  . GOUT 05/09/2010  . OBESITY 05/09/2010  . Obstructive sleep apnea 05/09/2010  . Essential hypertension 05/09/2010  . VENTRICULAR TACHYCARDIA 05/09/2010    Lanice Shirts  PT 10/31/2020, 6:15 PM  [facility] [address] [place], [place], [address] Phone: [phone]   Fax:  [phone]  Name: [name] MRN: [id number] Date of Birth: 1933/12/21

## 2020-11-01 ENCOUNTER — Encounter: Payer: Self-pay | Admitting: Occupational Therapy

## 2020-11-01 NOTE — Patient Instructions (Signed)
    Drew Belts, do some simple household chores that will allow you to work on his attention - folding clothes, sorting silverware, sorting a shelf in the pantry, or a drawer in the kitchen, etc. (Think of something that might take 5-10 minutes to complete.)

## 2020-11-01 NOTE — Therapy (Signed)
[facility] [address] [place], [place], [address] Phone: [phone]   Fax:  [phone]  Speech Language Pathology Evaluation  Patient Details  Name: [name] MRN: [id number] Date of Birth: 03/18/1934 Referring Provider (SLP): Margie Billet, PA-C; Dr. [name] PCP (doc)   Encounter Date: 10/31/2020   End of Session - 11/01/20 1448    Visit Number 1    Number of Visits 17    Date for SLP Re-Evaluation 01/29/21    SLP Start Time 1533    SLP Stop Time  1622    SLP Time Calculation (min) 49 min    Activity Tolerance Patient tolerated treatment well           Past Medical History:  Diagnosis Date  . Afib ([place])   . Arthritis   . BPH (benign prostatic hyperplasia)   . COPD (chronic obstructive pulmonary disease) ([place])   . Coronary artery disease   . Coronary artery ectasia    Mild CAD with normal systolic function per cath in August of 2011  . Depression   . Diaphragmatic paralysis    felt to be partially responsible for dyspnea  . Dyspnea    due to paralyzed diaphragm and pulmonary issues  . Dysrhythmia   . GERD (gastroesophageal reflux disease)   . Gout   . HTN (hypertension)   . Hypercholesteremia   . Hypertension   . Iron deficiency anemia   . Microhematuria   . Obesity   . OSA (obstructive sleep apnea)    CPAP  . Paroxysmal atrial fibrillation (H[facility])    occured in the setting of acute E Coli sepsis and ileius 7/12  . PVC (premature ventricular contraction)    s/p PVC ablation 05/29/2010    Past Surgical History:  Procedure Laterality Date  . APPENDECTOMY  1978  . BIOPSY  02/09/2019   Procedure: BIOPSY;  Surgeon: Clarene Essex, MD;  Location: [facility];  Service: Endoscopy;;  . BIOPSY  03/02/2019   Procedure: BIOPSY;  Surgeon: Clarene Essex, MD;  Location: [facility];  Service: Endoscopy;;  . CARDIAC CATHETERIZATION  04-30-2010   Left main coronary artery is normal.   .  CARDIOVASCULAR STRESS TEST  03-19-2010   0%  . CHOLECYSTECTOMY  1990  . COLONOSCOPY N/A 03/02/2019   Procedure: COLONOSCOPY;  Surgeon: Clarene Essex, MD;  [facility];  Service: Endoscopy;  Laterality: N/A;  . COLONOSCOPY W/ POLYPECTOMY    . ESOPHAGOGASTRODUODENOSCOPY (EGD) WITH PROPOFOL N/A 02/09/2019   Procedure: ESOPHAGOGASTRODUODENOSCOPY (EGD) WITH PROPOFOL;  Surgeon: Clarene Essex, MD;  Location[facility];  Service: Endoscopy;  Laterality: N/A;  . EYE SURGERY Right    catarct  . poly removed from nose as a child    . PVC ablation  05/29/2010  . US ECHOCARDIOGRAPHY  03-09-2010   EF 55-60%    There were no vitals filed for this visit.       SLP Evaluation [facility] - 10/31/20 1555      SLP Visit Information   SLP Received On 10/31/20    Referring Provider (SLP) Margie Billet, PA-C; Dr. [name] PCP (doc)    Onset Date 08-15-20      Subjective   Subjective "I'll be doing one thing and leave to go do something else and forget to come back to it."    Patient/Family Stated Goal improved attention      Prior Functional Status   Cognitive/Linguistic Baseline Within functional limits    Type of  Home House     Lives With Spouse    Available Support Family    Vocation Other (Comment)   management lumber and building supply     Cognition   Overall Cognitive Status Impaired/Different from baseline    Area of Impairment Awareness;Attention;Problem solving;Memory    Current Attention Level Selective    Attention Comments Pt able to attend long enough to tell "odd-man-out" with 3 members but if 4 members pt stated, "I only heard 3.", or guessed correctly if odd-man-out was the last stimulus word.  Deficit worse with verbal attention than visual attention. Lastly, pt thought of7 "r" words. When asked why this was challenging pt stated "I couldn't think long enough to think of any more."    Memory Comments Pt turned around to ask wife initial 3 questions SLP asked to the patient.     Awareness Comments Pt did not endorse any deficits other than physical unless asked by SLP specifically about simple calendar math, attention, and memory    Problem Solving Slow processing    Problem Solving Comments Simple calendar math was challenging for pt without max SLP cues. Pt took almost one minute to sort 10 pictures into 3 groups.      Auditory Comprehension   Overall Auditory Comprehension Comments Cannot completely rule out aud comp deficits given presence of verbal aphasia with numbers observed today.                           SLP Education - 11/01/20 1447    Education Details suggestions for functional attention tasks at home, eval results, possible goals    Person(s) Educated Patient;Spouse    Methods Explanation;Demonstration    Comprehension Verbalized understanding;Tactile cues required            SLP Short Term Goals - 11/01/20 1522      SLP SHORT TERM GOAL #1   Title pt will perform functional reading and/or written tasks for 7 minutes in 2 sessions    Time 4    Period Weeks    Status New      SLP SHORT TERM GOAL #2   Title pt will process verbal or written directions with 80% accuracy with occasional min A    Time 4    Period Weeks    Status New      SLP SHORT TERM GOAL #3   Title pt will verbally state 3 strategies to improve memory skills in 3 sessions    Time 4    Period Weeks    Status New      SLP SHORT TERM GOAL #4   Title pt will demo understanding of his non-physical deficits folloiwng brain bleed with modified independence in 3 sessions    Time 4    Period Weeks    Status New            SLP Long Term Goals - 11/01/20 1657      SLP LONG TERM GOAL #1   Title Pt will use memory compensation system for daily tasks when appropriate, x1 in 4 sessions    Time 8    Period Weeks   or 17 visits, for all LTGs     SLP LONG TERM GOAL #2   Title pt will demonstrate error awareness in linguistic tasks 90% of the time with  nonverbal cues in 3 sessions    Time 8    Period Weeks    Status  New      SLP LONG TERM GOAL #3   Title pt and/or wife will report pt has completed tasks demonstrating ability to solve problems in the home environment x3 sessions    Time 8    Period Weeks    Status New      SLP LONG TERM GOAL #4   Title pt will show knowledge of compensating for cognitive deficits in linguistic tasks    Time [place] - 11/01/20 1449    Clinical Impression Statement [name] presents today with cognitive communication deficits in areas of attention, problem solving, processing, awareness, and memory. Presence of aphasia cannot be completely ruled out at this time - and will be monitored. Aphasia eval to take place if clinically indicated. [name]'s wife [name] reports that pt now demonstrates limited ability to recall verbal or written material after his injury/fall. SLP believes pt will benefit from skilled ST tageting these cognitive deficits and assist pt to regain his PLOF.    Speech Therapy Frequency 2x / week    Duration 8 weeks   or 17 total sessions   Treatment/Interventions Language facilitation;Compensatory techniques;Internal/external aids;SLP instruction and feedback;Patient/family education;Environmental controls;Cognitive reorganization    Potential to Achieve Goals Good    Consulted and Agree with Plan of Care Patient;Family member/caregiver           Patient will benefit from skilled therapeutic intervention in order to improve the following deficits and impairments:   Cognitive communication deficit    Problem List Patient Active Problem List   Diagnosis Date Noted  . Subdural hematoma ([place]) 08/15/2020  . Iron deficiency anemia due to chronic blood loss 01/06/2019  . Orthostatic hypotension 01/05/2019    Class: Question of  . Normocytic normochromic anemia 01/05/2019  . Near syncope 01/05/2019  . Chronic respiratory failure  with hypoxia ([place]) 06/30/2018  . OSA (obstructive sleep apnea) 02/11/2018  . Physical deconditioning 08/11/2017  . Shortness of breath 10/24/2014  . COPD (chronic obstructive pulmonary disease) with emphysema ([place]) 09/01/2014  . Fatigue 10/14/2011  . Atrial fibrillation ([place]) 05/15/2011  . Premature ventricular contractions (PVCs) ([facility]) 02/25/2011  . 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  . PERSISTENT DISORDER INITIATING/MAINTAINING SLEEP 05/10/2010  . HYPERCHOLESTEROLEMIA 05/09/2010  . GOUT 05/09/2010  . OBESITY 05/09/2010  . Obstructive sleep apnea 05/09/2010  . Essential hypertension 05/09/2010  . VENTRICULAR TACHYCARDIA 05/09/2010    Taijah Macrae ,MS, CCC-SLP.[place]  11/01/2020, 5:23 PM  [facility] [address] [place], [place], [address] Phone: [phone]   Fax:  [phone]  Name: [name] MRN: [id number] Date of Birth: Apr 11, 1934

## 2020-11-01 NOTE — Therapy (Signed)
[facility] [address] [place], [place], [address] Phone: [phone]   Fax:  [phone]  Occupational Therapy Evaluation  Patient Details  Name: [name] MRN: [id number] Date of Birth: 03/09/1934 Referring Provider (OT): Dr. Brett Fairy (referred by hospitalist with send to neurology)   Encounter Date: 10/31/2020   OT End of Session - 11/01/20 0843    Visit Number 1    Number of Visits 25    Date for OT Re-Evaluation 01/24/21    Authorization Type [employer] Medicare    Authorization Time Period 90 days, anticipate d/c after 8 weeks    Authorization - Visit Number 1    Authorization - Number of Visits 10    OT Start Time 1856    OT Stop Time 1445    OT Time Calculation (min) 40 min    Activity Tolerance Patient tolerated treatment well    Behavior During Therapy Impulsive           Past Medical History:  Diagnosis Date  . Afib ([place])   . Arthritis   . BPH (benign prostatic hyperplasia)   . COPD (chronic obstructive pulmonary disease) ([place])   . Coronary artery disease   . Coronary artery ectasia    Mild CAD with normal systolic function per cath in August of 2011  . Depression   . Diaphragmatic paralysis    felt to be partially responsible for dyspnea  . Dyspnea    due to paralyzed diaphragm and pulmonary issues  . Dysrhythmia   . GERD (gastroesophageal reflux disease)   . Gout   . HTN (hypertension)   . Hypercholesteremia   . Hypertension   . Iron deficiency anemia   . Microhematuria   . Obesity   . OSA (obstructive sleep apnea)    CPAP  . Paroxysmal atrial fibrillation ([facility])    occured in the setting of acute E Coli sepsis and ileius 7/12  . PVC (premature ventricular contraction)    s/p PVC ablation 05/29/2010    Past Surgical History:  Procedure Laterality Date  . APPENDECTOMY  1978  . BIOPSY  02/09/2019   Procedure: BIOPSY;  Surgeon: Clarene Essex, MD;  [facility];  Service:  Endoscopy;;  . BIOPSY  03/02/2019   Procedure: BIOPSY;  Surgeon: Clarene Essex, MD;  [facility];  Service: Endoscopy;;  . CARDIAC CATHETERIZATION  04-30-2010   Left main coronary artery is normal.   . CARDIOVASCULAR STRESS TEST  03-19-2010   0%  . CHOLECYSTECTOMY  1990  . COLONOSCOPY N/A 03/02/2019   Procedure: COLONOSCOPY;  Surgeon: Clarene Essex, MD;  [facility];  Service: Endoscopy;  Laterality: N/A;  . COLONOSCOPY W/ POLYPECTOMY    . ESOPHAGOGASTRODUODENOSCOPY (EGD) WITH PROPOFOL N/A 02/09/2019   Procedure: ESOPHAGOGASTRODUODENOSCOPY (EGD) WITH PROPOFOL;  Surgeon: Clarene Essex, MD;  [facility];  Service: Endoscopy;  Laterality: N/A;  . EYE SURGERY Right    catarct  . poly removed from nose as a child    . PVC ablation  05/29/2010  . US ECHOCARDIOGRAPHY  03-09-2010   EF 55-60%    There were no vitals filed for this visit.   Subjective Assessment - 10/31/20 1404    Subjective  Pt denies pain    Pertinent History Pt had a fall while shopping in [employer] on 08/15/20. Pt sustained a parafalcine and tentorial SDH. Was discharged from the hospital on 08/17/20.PMH:HTN, CAD, COPD, A-fib    Patient Stated Goals improve balance and strength  Currently in Pain? No/denies             [facility] OT Assessment - 10/31/20 1408      Assessment   Medical Diagnosis Subdural hematoma    Referring Provider (OT) Dr. Brett Fairy   referred by hospitalist with send to neurology   Onset Date/Surgical Date 08/15/20    Hand Dominance Right    Prior Therapy PT      Precautions   Precautions Fall    Precaution Comments can't lay down flat on back   paralized diaphram     Balance Screen   Has the patient fallen in the past 6 months Yes    How many times? 3    Has the patient had a decrease in activity level because of a fear of falling?  Yes    Is the patient reluctant to leave their home because of a fear of falling?  No      Home  Environment   Family/patient expects to be  discharged to: Private residence    [employer] Tub/Shower unit   has tub bench   Lives With Spouse      Prior Function   Level of Independence Independent    Leisure watching TV, use computer      ADL   Eating/Feeding Modified independent    Grooming Modified independent    Upper Body Bathing Modified independent    Lower Body Bathing Modified independent    Upper Body Dressing Independent    Lower Body Dressing Modified independent    Toilet Transfer Modified independent    Tub/Shower Transfer Supervision/safety   recommended by OT   ADL comments Pt reports perfroming basic ADLs mod ! however therapist recommends pt has supervision with tub transfer for safety      IADL   Light Housekeeping Needs help with all home maintenance tasks    Meal Prep Needs to have meals prepared and served    Medication Management Has difficulty remembering to take medication    Financial Management Dependent   Pt wife handled prior to fall     Mobility   Mobility Status History of falls   supervision- mod I     Written Expression   Dominant Hand Right    Handwriting 100% legible      Vision Assessment   Vision Assessment Vision not tested      Cognition   Overall Cognitive Status Impaired/Different from baseline    Area of Impairment Safety/judgement;Awareness;Memory;Problem solving    Current Attention Level Selective    Memory Decreased short-term memory    Safety/Judgement Decreased awareness of safety    Awareness --   decreased awareness of cognitve deficits   Problem Solving Slow processing    Memory Impaired    Memory Impairment Decreased short term memory    Problem Solving Impaired      Observation/Other Assessments   Standing Functional Reach Test R 8.5, L 10.5    Other Surveys  Select    Donning Doffing Jacket Comments 3 button/ unbutton 1 min 39 secs      Sensation   Light Touch Appears Intact      Coordination   9 Hole Peg Test Right;Left    Right 9 Hole Peg  Test 34.63    Left 9 Hole Peg Test 45.72      ROM / Strength   AROM / PROM / Strength AROM;Strength      AROM   Overall AROM  Within functional limits for  tasks performed      Strength   Overall Strength Deficits    Overall Strength Comments Bilateral proximal strength grossly 3+/5, distal 4/5      Hand Function   Right Hand Grip (lbs) 45.1    Left Hand Grip (lbs) 42.7                             OT Short Term Goals - 11/01/20 0830      OT SHORT TERM GOAL #1   Title Pt/ caregeiver will be I with HEP. 11/29/20    Time 4    Period Weeks    Status New    [place] Date 11/29/20      OT SHORT TERM GOAL #2   Title Pt / wife will verbalize understanding of strategies to increase safety and I with ADLS.    Time 4    Period Weeks    Status New      OT SHORT TERM GOAL #3   Title Pt will perfrom transitional movments for ADLS with close supervision and no LOB.    Baseline histroy of falls    Time 4    Period Weeks    Status New      OT SHORT TERM GOAL #4   Title Pt will demonstrate improved fine motor coordination in his LUE as eveidenced by decreasing 9-hole peg test score by 4 secs.    Time 4    Period Weeks    Status New      OT SHORT TERM GOAL #5   Title Pt will sequence a simple functional or ADL task  with 90% or better accuracy .    Time 4    Period Weeks    Status New      Additional Short Term Goals   Additional Short Term Goals Yes      OT SHORT TERM GOAL #6   Title Pt will demonstrate improved fine motor coordination for ADLs as evidenced by decreasing 3 button/ un button time to 90 secs or less.    Time 4    Period Weeks    Status New             OT Long Term Goals - 11/01/20 2694      OT LONG TERM GOAL #1   Title Pt/ wife will be I with updated HEP for proximal strength.    Time 12    Period Weeks    Status New    [place] Date 01/24/21      OT LONG TERM GOAL #2   Title Pt will demonstrate ability to perform simple beverage  / snack prep modifeied indpendent demonstraing good saftey awareness.    Time 12    Period Weeks    Status New      OT LONG TERM GOAL #3   Title Pt will perforrm bathing and  dressing mod I in a reasonable amout of time    Baseline increased time required    Time 12    Period Weeks    Status New      OT LONG TERM GOAL #4   Title Pt will demonstrate adequate bilateral UE strength to retrieve a lightweight object (3 lbs) from eye level  shelf without dropping with right and left UE's individually.    Time 12    Period Weeks    Status New      OT LONG TERM GOAL #  5   Title Pt will increase standing functional reach test score to 10 inches or greater with RUE to minimize fall risk during ADLs.    Time 12    Period Weeks    Status New                 Plan - 11/01/20 0817    Clinical Impression Statement Pt had a fall while shopping in [employer] on 08/15/20. Pt sustained a parafalcine and tentorial SDH. Was discharged from the hospital on 08/17/20.PMH:HTN, CAD, COPD, A-fib Pt presents to occupational therapy with the following deficits: decreased balance, decreased functional mobility, cognitve deficits, decreased coordination, decreased strength which impedes perfromance of ADLS/IADLS. Pt can benefit from skilled OT to address these deficits.    OT Occupational Profile and History Detailed Assessment- Review of Records and additional review of physical, cognitive, psychosocial history related to current functional performance    Occupational performance deficits (Please refer to evaluation for details): ADL's;IADL's;Leisure;Social Participation    Body Structure / Function / Physical Skills ADL;UE functional use;Balance;FMC;GMC;Coordination;Decreased knowledge of use of DME;IADL;Dexterity;Strength;Mobility    Cognitive Skills Memory;Safety Awareness;Problem Solve;Sequencing;Thought;Understand;Attention    Rehab Potential Good    Clinical Decision Making Limited treatment options, no task  modification necessary    Comorbidities Affecting Occupational Performance: May have comorbidities impacting occupational performance    Modification or Assistance to Complete Evaluation  Min-Moderate modification of tasks or assist with assess necessary to complete eval    OT Frequency 2x / week   plus eval, may d/c after 8 weeks dependent on progress.   OT Duration 12 weeks    OT Treatment/Interventions Self-care/ADL training;Energy conservation;Patient/family education;DME and/or AE instruction;Aquatic Therapy;Paraffin;Passive range of motion;Balance training;Fluidtherapy;Cryotherapy;Therapist, nutritional;Therapeutic activities;Manual Therapy;Therapeutic exercise;Moist Heat;Neuromuscular education;Cognitive remediation/compensation    Plan strategies for safety during ADLs, coordination HEP for LUE    Consulted and Agree with Plan of Care Patient;Family member/caregiver    Family Member Consulted wife           Patient will benefit from skilled therapeutic intervention in order to improve the following deficits and impairments:   Body Structure / Function / Physical Skills: ADL,UE functional use,Balance,FMC,GMC,Coordination,Decreased knowledge of use of DME,IADL,Dexterity,Strength,Mobility Cognitive Skills: Memory,Safety Awareness,Problem Solve,Sequencing,Thought,Understand,Attention     Visit Diagnosis: Muscle weakness (generalized)  Unsteadiness on feet  Other abnormalities of gait and mobility  Other lack of coordination  Frontal lobe and executive function deficit  Attention and concentration deficit    Problem List Patient Active Problem List   Diagnosis Date Noted  . Subdural hematoma ([place]) 08/15/2020  . Iron deficiency anemia due to chronic blood loss 01/06/2019  . Orthostatic hypotension 01/05/2019    Class: Question of  . Normocytic normochromic anemia 01/05/2019  . Near syncope 01/05/2019  . Chronic respiratory failure with hypoxia ([place]) 06/30/2018  .  OSA (obstructive sleep apnea) 02/11/2018  . Physical deconditioning 08/11/2017  . Shortness of breath 10/24/2014  . COPD (chronic obstructive pulmonary disease) with emphysema ([place]) 09/01/2014  . Fatigue 10/14/2011  . Atrial fibrillation ([place]) 05/15/2011  . Premature ventricular contractions (PVCs) ([facility]) 02/25/2011  . 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  . PERSISTENT DISORDER INITIATING/MAINTAINING SLEEP 05/10/2010  . HYPERCHOLESTEROLEMIA 05/09/2010  . GOUT 05/09/2010  . OBESITY 05/09/2010  . Obstructive sleep apnea 05/09/2010  . Essential hypertension 05/09/2010  . VENTRICULAR TACHYCARDIA 05/09/2010    Marvel Sapp 11/01/2020, 8:44 AM Theone Murdoch, OTR/L [phone] Phone: [phone] 1:01 PM 11/01/20 [facility] [address] [place], [place], [address]  Phone: [phone]   Fax:  [phone]  Name: [name] MRN: [id number] Date of Birth: [birth year]

## 2020-11-02 ENCOUNTER — Other Ambulatory Visit: Payer: Self-pay

## 2020-11-02 ENCOUNTER — Ambulatory Visit: Payer: Medicare Other

## 2020-11-02 DIAGNOSIS — R2681 Unsteadiness on feet: Secondary | ICD-10-CM | POA: Diagnosis not present

## 2020-11-02 DIAGNOSIS — M6281 Muscle weakness (generalized): Secondary | ICD-10-CM

## 2020-11-02 NOTE — Therapy (Signed)
[facility] [address] [place], [place], [address] Phone: [phone]   Fax:  [phone]  Physical Therapy Treatment  Patient Details  Name: [name] MRN: [id number] Date of Birth: [birth year] Referring Provider (PT): Dohmeier, Asencion Partridge, MD (is being followed by, was referred by hospitalist)   Encounter Date: 11/02/2020   PT End of Session - 11/02/20 1751    Visit Number 4    Number of Visits 17    Date for PT Re-Evaluation 01/22/21    Authorization Type [employer] Medicare    PT Start Time 0258    PT Stop Time 1615    PT Time Calculation (min) 45 min    Equipment Utilized During Treatment Gait belt    Activity Tolerance Patient tolerated treatment well    Behavior During Therapy Impulsive           Past Medical History:  Diagnosis Date  . Afib ([place])   . Arthritis   . BPH (benign prostatic hyperplasia)   . COPD (chronic obstructive pulmonary disease) ([place])   . Coronary artery disease   . Coronary artery ectasia    Mild CAD with normal systolic function per cath in August of 2011  . Depression   . Diaphragmatic paralysis    felt to be partially responsible for dyspnea  . Dyspnea    due to paralyzed diaphragm and pulmonary issues  . Dysrhythmia   . GERD (gastroesophageal reflux disease)   . Gout   . HTN (hypertension)   . Hypercholesteremia   . Hypertension   . Iron deficiency anemia   . Microhematuria   . Obesity   . OSA (obstructive sleep apnea)    CPAP  . Paroxysmal atrial fibrillation (HCC)    occured in the setting of acute E Coli sepsis and ileius 7/12  . PVC (premature ventricular contraction)    s/p PVC ablation 05/29/2010    Past Surgical History:  Procedure Laterality Date  . APPENDECTOMY  1978  . BIOPSY  02/09/2019   Procedure: BIOPSY;  Surgeon: Clarene Essex, MD;  Location: [facility];  Service: Endoscopy;;  . BIOPSY  03/02/2019   Procedure: BIOPSY;  Surgeon: Clarene Essex, MD;   Location: [facility];  Service: Endoscopy;;  . CARDIAC CATHETERIZATION  04-30-2010   Left main coronary artery is normal.   . CARDIOVASCULAR STRESS TEST  03-19-2010   0%  . CHOLECYSTECTOMY  1990  . COLONOSCOPY N/A 03/02/2019   Procedure: COLONOSCOPY;  Surgeon: Clarene Essex, MD;  Location: [facility];  Service: Endoscopy;  Laterality: N/A;  . COLONOSCOPY W/ POLYPECTOMY    . ESOPHAGOGASTRODUODENOSCOPY (EGD) WITH PROPOFOL N/A 02/09/2019   Procedure: ESOPHAGOGASTRODUODENOSCOPY (EGD) WITH PROPOFOL;  Surgeon: Clarene Essex, MD;  Location: [facility];  Service: Endoscopy;  Laterality: N/A;  . EYE SURGERY Right    catarct  . poly removed from nose as a child    . PVC ablation  05/29/2010  . US ECHOCARDIOGRAPHY  03-09-2010   EF 55-60%    There were no vitals filed for this visit.   Subjective Assessment - 11/02/20 1540    Subjective no falls, pain or med changes to report at start of session, has both a QC and SPC at home, has brought additional walker to review for potential modifications to make it more maneuverable    Patient is accompained by: Family member   wife, [name]   Pertinent History HTN, CAD, COPD, A-fib    Limitations Standing;Walking    Patient Stated Goals  wants to improve his balance    Currently in Pain? No/denies                             [facility] Adult PT Treatment/Exercise - 11/02/20 0001      Ambulation/Gait   Ambulation/Gait Yes    Ambulation/Gait Assistance 4: Min guard    Ambulation Distance (Feet) 230 Feet    Assistive device Straight cane    Gait Pattern Step-through pattern;Decreased arm swing - left    Ambulation Surface Level;Indoor    Gait Comments stepping over oranfe stripe, 2 laps, then 180ft using SPC to negotiate obstacles and narrow pathways      Knee/Hip Exercises: Aerobic   Nustep 8' @ L2               Balance Exercises - 11/02/20 0001      Balance Exercises: Standing   Standing Eyes Opened Narrow base of support  (BOS);Foam/compliant surface;2 reps;30 secs    Standing Eyes Opened Time 30s    Standing Eyes Opened Limitations single UE support    Standing Eyes Closed Narrow base of support (BOS);Foam/compliant surface;1 rep;30 secs    Tandem Stance Eyes open;Foam/compliant surface;2 reps;Limitations    Tandem Stance Time 30s ea. position    Retro Gait Upper extremity support;5 reps;Limitations    Retro Gait Limitations peformed at counter unde close S/min guard    Sidestepping Upper extremity support;5 reps;Limitations    Sidestepping Limitations performed at counter    Step Over Hurdles / Cones 4 low profile hurdles, 70ftx2 with SPC and CGA               PT Short Term Goals - 11/02/20 1641      PT SHORT TERM GOAL #1   Title Pt will initiate HEP in order to indicate improved functional mobility and decreased fall risk.  ALL STGS DUE 11/21/20    Time 4    Period Weeks    Status New    [place] Date 11/21/20      PT SHORT TERM GOAL #2   Title Pt will perform 5x sit <> stand in 17 seconds or less with BUE support in order to demo improved functional strength.    Baseline 19.31 seconds    Time 4    Period Weeks    Status New      PT SHORT TERM GOAL #3   Title Pt will undergo BERG with STG and LTG written as appropriate.    Baseline 10/27/20 BERG 37/56    Time 4    Period Weeks    Status Achieved      PT SHORT TERM GOAL #4   Title Pt will improve gait speed with rollator to at least 2.5 ft/sec in order demo improved community mobility.    Baseline 2.18 ft/sec    Time 4    Period Weeks    Status New      PT SHORT TERM GOAL #5   Title Pt and pt's spouse will verbalize understanding of fall prevention in the home to decr fall risk.    Time 4    Period Weeks    Status New             PT Long Term Goals - 10/27/20 1827      PT LONG TERM GOAL #1   Title Pt will be independent with final HEP in order to indicate improved functional mobility and decreased  fall risk.  ALL LTGS DUE  12/19/20    Time 8    Period Weeks    Status New      PT LONG TERM GOAL #2   Title BERG goal to be written as appropriate to determine decr fall risk.  BERG goal is 45/56    Baseline 37    Time 8    Period Weeks    Status New      PT LONG TERM GOAL #3   Title Pt will perform 5x sit <> stand in 14.5 seconds or less with BUE vs. single UE support in order to demo improved functional strength.    Baseline 19.31 seconds    Time 8    Period Weeks    Status New      PT LONG TERM GOAL #4   Title Pt will ambulate at least 150' over indoor surfaces with cane vs. LRAD with supervision in order to demo improved household mobility.    Time 8    Period Weeks    Status New      PT LONG TERM GOAL #5   Title Pt will perform TUG in 14.5 seconds or less with rollator vs. LRAD in order to demo decr fall risk.    Baseline 16.94 seconds with rollator.    Time 8    Period Weeks    Status New                 Plan - 11/02/20 1648    Clinical Impression Statement Patient incurred a fall today as he was in the process of sitting on the mat table in which he lost balance to the R and slid from the mat to the floor, he required assist of two to arise from floor back onto mat table, he denies any pain or injury, no physical injuries noted, incident happened at start of session but was able to complete remander of session without incident or treament modification, focus of todays session was gait with cane, stepping over and around obstacle and negotiating parrow pathways wit cane to become more proficient.  Second walker could not be modified, spouse informed of fall as well as inability to modify walker with treatment focus shifting to increasing balance and becoming more proficient with cane to avoid LOB episodes on second floor when using QC, SPC is better option for patient.    Personal Factors and Comorbidities Comorbidity 3+;Past/Current Experience    Comorbidities HTN, CAD, COPD, A-fib, hx of  COVID march 2021    Examination-Activity Limitations Stand;Squat;Transfers;Reach Overhead;Locomotion Level    Examination-Participation Restrictions Community Activity;Driving    Stability/Clinical Decision Making Evolving/Moderate complexity    Rehab Potential Good    PT Frequency 2x / week    PT Duration 8 weeks    PT Treatment/Interventions ADLs/Self Care Home Management;DME Instruction;Gait training;Functional mobility training;Stair training;Neuromuscular re-education;Balance training;Therapeutic exercise;Therapeutic activities;Patient/family education;Vestibular;Energy conservation    PT Next Visit Plan f/u on any issues related to fall, continue skilled treatment to improve balance and become more proficient with use of cane to reduce fall risk, monitor for safety awareness and impulsive behavior    Consulted and Agree with Plan of Care Patient           Patient will benefit from skilled therapeutic intervention in order to improve the following deficits and impairments:  Abnormal gait,Decreased activity tolerance,Decreased balance,Decreased endurance,Decreased coordination,Decreased cognition,Decreased knowledge of use of DME,Decreased range of motion,Difficulty walking,Decreased safety awareness,Decreased strength  Visit Diagnosis: Unsteadiness on feet  Muscle weakness (generalized)     Problem List Patient Active Problem List   Diagnosis Date Noted  . Subdural hematoma ([place]) 08/15/2020  . Iron deficiency anemia due to chronic blood loss 01/06/2019  . Orthostatic hypotension 01/05/2019    Class: Question of  . Normocytic normochromic anemia 01/05/2019  . Near syncope 01/05/2019  . Chronic respiratory failure with hypoxia ([place]) 06/30/2018  . OSA (obstructive sleep apnea) 02/11/2018  . Physical deconditioning 08/11/2017  . Shortness of breath 10/24/2014  . COPD (chronic obstructive pulmonary disease) with emphysema ([place]) 09/01/2014  . Fatigue 10/14/2011  . Atrial  fibrillation ([place]) 05/15/2011  . Premature ventricular contractions (PVCs) ([facility]) 02/25/2011  . 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  . PERSISTENT DISORDER INITIATING/MAINTAINING SLEEP 05/10/2010  . HYPERCHOLESTEROLEMIA 05/09/2010  . GOUT 05/09/2010  . OBESITY 05/09/2010  . Obstructive sleep apnea 05/09/2010  . Essential hypertension 05/09/2010  . VENTRICULAR TACHYCARDIA 05/09/2010    Lanice Shirts 11/02/2020, [facility] [address] [place], [place], [address] Phone: [phone]   Fax:  [phone]  Name: CHRISHUN SCHEER MRN: [id number] Date of Birth: May 01, 1934

## 2020-11-04 ENCOUNTER — Other Ambulatory Visit (HOSPITAL_COMMUNITY)
Admission: RE | Admit: 2020-11-04 | Discharge: 2020-11-04 | Disposition: A | Discharge status: Home / Self Care | Payer: Medicare Other | Source: Ambulatory Visit | Attending: Neurology | Admitting: Neurology

## 2020-11-04 DIAGNOSIS — Z20822 Contact with and (suspected) exposure to covid-19: Secondary | ICD-10-CM | POA: Diagnosis not present

## 2020-11-04 DIAGNOSIS — Z01812 Encounter for preprocedural laboratory examination: Secondary | ICD-10-CM | POA: Diagnosis present

## 2020-11-04 LAB — SARS CORONAVIRUS 2 (TAT 6-24 HRS): SARS Coronavirus 2: NEGATIVE

## 2020-11-06 ENCOUNTER — Encounter (HOSPITAL_COMMUNITY): Payer: Self-pay | Admitting: *Deleted

## 2020-11-06 NOTE — Progress Notes (Signed)
Spoke with pt for pre-op call. Pt has hx of CAD and A-fib. Pt has had an ablation. Dr. [name] is his cardiologist. LOV was 10/17/20. Pt denies any recent chest pain or shortness of breath.  Covid test done 11/04/20 and it's negative.  Pt states he's been in quarantine since the test was done and understands that he stays in quarantine until he comes to the hospital tomorrow.

## 2020-11-06 NOTE — Progress Notes (Signed)
Negative COVID swab test.

## 2020-11-06 NOTE — Progress Notes (Signed)
Anesthesia Chart Review: Same day workup  Follows with cardiology for hx of PVC ablation 05/29/10, PAF (not on Eliquis due to bleeding), mild CAD perremotecath August of 2011. Last seen 10/17/20. Per note, no active chest pain, continue conservative management, continue current meds. It was discussed that he has been having frequent falls and balance issues. Etoh intake was felt to be significant contributor. He was counseled on stopping.   Follows with pulmonology for hx of COPD (spirometry 07/23/17 showed very severe restriction) on 2L O2 qhs and 2-3L prn during the day, disorder of diaphragm, OSA on CPAP.   Following with neurology for frequent falls, balance issues. Initial consult 10/16/20. Per note, "Mr Overbeck has been walking with a cane for about 3 years and over the last 6 month had to switch to a walker, he has had frequent falls since and suffered a bleed.Marland KitchenMarland KitchenThe frequency of falls has increased and especially over the last 6 months.  On  December 7th,  he had to be hospitalized after a fall at [employer], and a brain bleed was found, a SDH.  since then repeated falls very 10-14 days." MRI of the head was recommended.   Will need DOS labs and evaluation.   EKG 10/18/19 (narrative per Truitt Merle, NP note same date): This demonstrates probable NSR with PACs - lots of artifact due to shaking.  PORTABLE CHEST 1 VIEW 08/15/20: COMPARISON:  11/14/2019 chest radiograph.  FINDINGS: Right rotated chest radiograph with low lung volumes. Stable cardiomediastinal silhouette with normal heart size. No pneumothorax. No pleural effusion. No pulmonary edema. Mild curvilinear right lung base opacity, favor scarring versus atelectasis.  IMPRESSION: Low lung volumes with mild curvilinear right lung base opacity, favor scarring versus atelectasis.  TTE 12/08/19: 1. Incessant ectopy makes wall motion analysis challenging. Cannot  confidently exclude basal inferolateral hypokinesis, but uspect   PVC-related artifact. Other wall segments contract normally. Left  ventricular ejection fraction, by estimation, is 55 to  60%. The left ventricle has normal function. The left ventricle has no  regional wall motion abnormalities. There is moderate concentric left  ventricular hypertrophy. Left ventricular diastolic function could not be  evaluated.  2. Right ventricular systolic function is normal. The right ventricular  size is normal. The estimated right ventricular systolic pressure is 92.1  mmHg.  3. Left atrial size was severely dilated.  4. The mitral valve is normal in structure. Mild mitral valve  regurgitation.  5. The aortic valve is tricuspid. Aortic valve regurgitation is trivial.  Mild to moderate aortic valve sclerosis/calcification is present, without  any evidence of aortic stenosis.  6. The inferior vena cava is dilated in size with >50% respiratory  variability, suggesting right atrial pressure of 8 mmHg.   Comparison(s): No significant change from prior study. Prior images  reviewed side by side.   Holter 02/2018 Narrative & Impression   Sinus rhythm with first degree AV block, Right bundle branch block Average heart rate is 62 bpm Occasional premature ventricular contractions Frequent premature atrial contractions with nonsustained atrial tachycardia Nonsustained atrial fibrillation also observed Nocturnal bradycardia with mobitz I second degree AV block is noted transiently No sustained arrhythmias No prolonged pauses  Nuclear stress 11/2014 Exercise Capacity:Lexiscan with no exercise. BP Response:Normal blood pressure response. Clinical Symptoms:Nausea. ECG Impression:Atrial fibrillation, no changes from baseline.  Comparison with Prior Nuclear Study: No images to compare  Overall Impression:Low risk stress nuclear study a medium-sized basal inferior and basal to mid inferolateral perfusion defect that is actually worse at rest  than  with stress.No ischemia.Given normal wall motion, this may represent diaphragmatic attenuation. .  LV Ejection Fraction: 63%.LV Wall Motion:NL LV Function; NL Wall Motion    [place] Musty [facility] Short Stay Center/Anesthesiology Phone [phone] 11/06/2020 1:32 PM

## 2020-11-06 NOTE — Anesthesia Preprocedure Evaluation (Addendum)
Anesthesia Evaluation  Patient identified by MRN, date of birth, ID band Patient awake    Reviewed: Allergy & Precautions, H&P , NPO status , Patient's Chart, lab work & pertinent test results  Airway Mallampati: II   Neck ROM: full    Dental   Pulmonary shortness of breath, sleep apnea , COPD, former smoker,    breath sounds clear to auscultation       Cardiovascular hypertension, + CAD  + dysrhythmias Atrial Fibrillation  Rhythm:regular Rate:Normal     Neuro/Psych PSYCHIATRIC DISORDERS Depression  Neuromuscular disease    GI/Hepatic GERD  ,  Endo/Other    Renal/GU      Musculoskeletal  (+) Arthritis ,   Abdominal   Peds  Hematology   Anesthesia Other Findings   Reproductive/Obstetrics                            Anesthesia Physical Anesthesia Plan  ASA: III  Anesthesia Plan: General   Post-op Pain Management:    Induction: Intravenous  PONV Risk Score and Plan: 2 and Ondansetron and Treatment may vary due to age or medical condition  Airway Management Planned: LMA  Additional Equipment:   Intra-op Plan:   Post-operative Plan: Extubation in OR  Informed Consent: I have reviewed the patients History and Physical, chart, labs and discussed the procedure including the risks, benefits and alternatives for the proposed anesthesia with the patient or authorized representative who has indicated his/her understanding and acceptance.     Dental advisory given  Plan Discussed with: CRNA, Anesthesiologist and Surgeon  Anesthesia Plan Comments: (PAT note by Karoline Caldwell, PA-C: Follows with cardiology for hx of PVC ablation 05/29/10, PAF (not on Eliquis due to bleeding), mild CAD perremotecath August of 2011. Last seen 10/17/20. Per note, no active chest pain, continue conservative management, continue current meds. It was discussed that he has been having frequent falls and balance issues.  Etoh intake was felt to be significant contributor. He was counseled on stopping.   Follows with pulmonology for hx of COPD (spirometry 07/23/17 showed very severe restriction) on 2L O2 qhs and 2-3L prn during the day, disorder of diaphragm, OSA on CPAP.   Following with neurology for frequent falls, balance issues. Initial consult 10/16/20. Per note, "Mr Campanelli has been walking with a cane for about 3 years and over the last 6 month had to switch to a walker, he has had frequent falls since and suffered a bleed.Marland KitchenMarland KitchenThe frequency of falls has increased and especially over the last 6 months.  On  December 7th,  he had to be hospitalized after a fall at [employer], and a brain bleed was found, a SDH.  since then repeated falls very 10-14 days." MRI of the head was recommended.   Will need DOS labs and evaluation.   EKG 10/18/19 (narrative per Truitt Merle, NP note same date): This demonstrates probable NSR with PACs - lots of artifact due to shaking.  PORTABLE CHEST 1 VIEW 08/15/20: COMPARISON:  11/14/2019 chest radiograph.  FINDINGS: Right rotated chest radiograph with low lung volumes. Stable cardiomediastinal silhouette with normal heart size. No pneumothorax. No pleural effusion. No pulmonary edema. Mild curvilinear right lung base opacity, favor scarring versus atelectasis.  IMPRESSION: Low lung volumes with mild curvilinear right lung base opacity, favor scarring versus atelectasis.  TTE 12/08/19: 1. Incessant ectopy makes wall motion analysis challenging. Cannot  confidently exclude basal inferolateral hypokinesis, but uspect  PVC-related artifact. Other wall  segments contract normally. Left  ventricular ejection fraction, by estimation, is 55 to  60%. The left ventricle has normal function. The left ventricle has no  regional wall motion abnormalities. There is moderate concentric left  ventricular hypertrophy. Left ventricular diastolic function could not be  evaluated.  2. Right  ventricular systolic function is normal. The right ventricular  size is normal. The estimated right ventricular systolic pressure is 47.8  mmHg.  3. Left atrial size was severely dilated.  4. The mitral valve is normal in structure. Mild mitral valve  regurgitation.  5. The aortic valve is tricuspid. Aortic valve regurgitation is trivial.  Mild to moderate aortic valve sclerosis/calcification is present, without  any evidence of aortic stenosis.  6. The inferior vena cava is dilated in size with >50% respiratory  variability, suggesting right atrial pressure of 8 mmHg.   Comparison(s): No significant change from prior study. Prior images  reviewed side by side.   Holter 02/2018 Narrative & Impression   Sinus rhythm with first degree AV block, Right bundle branch block Average heart rate is 62 bpm Occasional premature ventricular contractions Frequent premature atrial contractions with nonsustained atrial tachycardia Nonsustained atrial fibrillation also observed Nocturnal bradycardia with mobitz I second degree AV block is noted transiently No sustained arrhythmias No prolonged pauses  Nuclear stress 11/2014 Exercise Capacity:Lexiscan with no exercise. BP Response:Normal blood pressure response. Clinical Symptoms:Nausea. ECG Impression:Atrial fibrillation, no changes from baseline.  Comparison with Prior Nuclear Study: No images to compare  Overall Impression:Low risk stress nuclear study a medium-sized basal inferior and basal to mid inferolateral perfusion defect that is actually worse at rest than with stress.No ischemia.Given normal wall motion, this may represent diaphragmatic attenuation. .  LV Ejection Fraction: 63%.LV Wall Motion:NL LV Function; NL Wall Motion    )       Anesthesia Quick Evaluation

## 2020-11-07 ENCOUNTER — Other Ambulatory Visit: Payer: Self-pay | Admitting: Neurology

## 2020-11-07 ENCOUNTER — Encounter (HOSPITAL_COMMUNITY): Admission: RE | Disposition: A | Discharge status: Home / Self Care | Payer: Self-pay | Source: Home / Self Care

## 2020-11-07 ENCOUNTER — Encounter (HOSPITAL_COMMUNITY): Payer: Self-pay

## 2020-11-07 ENCOUNTER — Ambulatory Visit: Payer: Medicare Other | Admitting: Occupational Therapy

## 2020-11-07 ENCOUNTER — Ambulatory Visit (HOSPITAL_COMMUNITY)
Admission: RE | Admit: 2020-11-07 | Discharge: 2020-11-07 | Disposition: A | Discharge status: Home / Self Care | Payer: Medicare Other | Attending: Neurology | Admitting: Neurology

## 2020-11-07 ENCOUNTER — Ambulatory Visit (HOSPITAL_COMMUNITY)
Admission: RE | Admit: 2020-11-07 | Discharge: 2020-11-07 | Disposition: A | Discharge status: Home / Self Care | Payer: Medicare Other | Source: Ambulatory Visit | Attending: Neurology | Admitting: Neurology

## 2020-11-07 ENCOUNTER — Other Ambulatory Visit: Payer: Self-pay

## 2020-11-07 ENCOUNTER — Ambulatory Visit (HOSPITAL_COMMUNITY): Payer: Medicare Other

## 2020-11-07 ENCOUNTER — Ambulatory Visit (HOSPITAL_COMMUNITY): Payer: Medicare Other | Admitting: Physician Assistant

## 2020-11-07 ENCOUNTER — Ambulatory Visit: Payer: Medicare Other | Admitting: Physical Therapy

## 2020-11-07 ENCOUNTER — Ambulatory Visit (HOSPITAL_COMMUNITY)
Admission: RE | Admit: 2020-11-07 | Discharge: 2020-11-07 | Disposition: A | Discharge status: Home / Self Care | Payer: Medicare Other | Source: Home / Self Care | Attending: Neurology | Admitting: Neurology

## 2020-11-07 DIAGNOSIS — I4891 Unspecified atrial fibrillation: Secondary | ICD-10-CM | POA: Insufficient documentation

## 2020-11-07 DIAGNOSIS — R296 Repeated falls: Secondary | ICD-10-CM | POA: Diagnosis not present

## 2020-11-07 DIAGNOSIS — R0600 Dyspnea, unspecified: Secondary | ICD-10-CM | POA: Insufficient documentation

## 2020-11-07 DIAGNOSIS — S065X9A Traumatic subdural hemorrhage with loss of consciousness of unspecified duration, initial encounter: Secondary | ICD-10-CM | POA: Diagnosis present

## 2020-11-07 DIAGNOSIS — I10 Essential (primary) hypertension: Secondary | ICD-10-CM | POA: Insufficient documentation

## 2020-11-07 DIAGNOSIS — I6789 Other cerebrovascular disease: Secondary | ICD-10-CM | POA: Insufficient documentation

## 2020-11-07 DIAGNOSIS — Z8679 Personal history of other diseases of the circulatory system: Secondary | ICD-10-CM | POA: Diagnosis not present

## 2020-11-07 DIAGNOSIS — J449 Chronic obstructive pulmonary disease, unspecified: Secondary | ICD-10-CM | POA: Insufficient documentation

## 2020-11-07 DIAGNOSIS — M4807 Spinal stenosis, lumbosacral region: Secondary | ICD-10-CM

## 2020-11-07 DIAGNOSIS — S065XAA Traumatic subdural hemorrhage with loss of consciousness status unknown, initial encounter: Secondary | ICD-10-CM

## 2020-11-07 DIAGNOSIS — M48061 Spinal stenosis, lumbar region without neurogenic claudication: Secondary | ICD-10-CM | POA: Insufficient documentation

## 2020-11-07 DIAGNOSIS — I714 Abdominal aortic aneurysm, without rupture: Secondary | ICD-10-CM | POA: Insufficient documentation

## 2020-11-07 DIAGNOSIS — R930 Abnormal findings on diagnostic imaging of skull and head, not elsewhere classified: Secondary | ICD-10-CM | POA: Diagnosis not present

## 2020-11-07 DIAGNOSIS — I951 Orthostatic hypotension: Secondary | ICD-10-CM

## 2020-11-07 DIAGNOSIS — G4733 Obstructive sleep apnea (adult) (pediatric): Secondary | ICD-10-CM | POA: Diagnosis not present

## 2020-11-07 DIAGNOSIS — Z13850 Encounter for screening for traumatic brain injury: Secondary | ICD-10-CM | POA: Insufficient documentation

## 2020-11-07 DIAGNOSIS — D500 Iron deficiency anemia secondary to blood loss (chronic): Secondary | ICD-10-CM

## 2020-11-07 DIAGNOSIS — G629 Polyneuropathy, unspecified: Secondary | ICD-10-CM

## 2020-11-07 HISTORY — PX: RADIOLOGY WITH ANESTHESIA: SHX6223

## 2020-11-07 LAB — CBC
HCT: 41.2 % (ref 39.0–52.0)
Hemoglobin: 12.9 g/dL — ABNORMAL LOW (ref 13.0–17.0)
MCH: 31.9 pg (ref 26.0–34.0)
MCHC: 31.3 g/dL (ref 30.0–36.0)
MCV: 102 fL — ABNORMAL HIGH (ref 80.0–100.0)
Platelets: 176 10*3/uL (ref 150–400)
RBC: 4.04 MIL/uL — ABNORMAL LOW (ref 4.22–5.81)
RDW: 14.8 % (ref 11.5–15.5)
WBC: 5.7 10*3/uL (ref 4.0–10.5)
nRBC: 0 % (ref 0.0–0.2)

## 2020-11-07 LAB — BASIC METABOLIC PANEL
Anion gap: 12 (ref 5–15)
BUN: 30 mg/dL — ABNORMAL HIGH (ref 8–23)
CO2: 30 mmol/L (ref 22–32)
Calcium: 8.9 mg/dL (ref 8.9–10.3)
Chloride: 100 mmol/L (ref 98–111)
Creatinine, Ser: 1.03 mg/dL (ref 0.61–1.24)
GFR, Estimated: >60 mL/min (ref >60)
Glucose, Bld: 98 mg/dL (ref 70–99)
Potassium: 3.7 mmol/L (ref 3.5–5.1)
Sodium: 142 mmol/L (ref 135–145)

## 2020-11-07 SURGERY — MRI WITH ANESTHESIA
Anesthesia: General

## 2020-11-07 MED ORDER — PHENYLEPHRINE 40 MCG/ML (10ML) SYRINGE FOR IV PUSH (FOR BLOOD PRESSURE SUPPORT)
PREFILLED_SYRINGE | INTRAVENOUS | Status: DC | PRN
  Administered 2020-11-07: 80 ug via INTRAVENOUS
  Administered 2020-11-07 (×2): 200 ug via INTRAVENOUS
  Administered 2020-11-07: 120 ug via INTRAVENOUS

## 2020-11-07 MED ORDER — FENTANYL CITRATE (PF) 100 MCG/2ML IJ SOLN
25.0000 ug | INTRAMUSCULAR | Status: DC | PRN
Start: 2020-11-07 — End: 2020-11-07

## 2020-11-07 MED ORDER — ONDANSETRON HCL 4 MG/2ML IJ SOLN: 4.0000 mg | Freq: Four times a day (QID) | INTRAMUSCULAR | Status: DC | PRN

## 2020-11-07 MED ORDER — OXYCODONE HCL 5 MG/5ML PO SOLN: 5.0000 mg | Freq: Once | ORAL | Status: DC | PRN

## 2020-11-07 MED ORDER — CHLORHEXIDINE GLUCONATE 0.12 % MT SOLN: 15.0000 mL | Freq: Once | OROMUCOSAL | Status: AC

## 2020-11-07 MED ORDER — DEXAMETHASONE SODIUM PHOSPHATE 10 MG/ML IJ SOLN
INTRAMUSCULAR | Status: DC | PRN
  Administered 2020-11-07: 5 mg via INTRAVENOUS

## 2020-11-07 MED ORDER — PROPOFOL 10 MG/ML IV BOLUS
INTRAVENOUS | Status: DC | PRN
  Administered 2020-11-07: 150 mg via INTRAVENOUS
  Administered 2020-11-07: 50 mg via INTRAVENOUS

## 2020-11-07 MED ORDER — EPHEDRINE SULFATE-NACL 50-0.9 MG/10ML-% IV SOSY
PREFILLED_SYRINGE | INTRAVENOUS | Status: DC | PRN
  Administered 2020-11-07 (×2): 5 mg via INTRAVENOUS
  Administered 2020-11-07: 10 mg via INTRAVENOUS

## 2020-11-07 MED ORDER — OXYCODONE HCL 5 MG PO TABS: 5.0000 mg | ORAL_TABLET | Freq: Once | ORAL | Status: DC | PRN

## 2020-11-07 MED ORDER — CHLORHEXIDINE GLUCONATE 0.12 % MT SOLN
OROMUCOSAL | Status: AC
  Administered 2020-11-07: 15 mL via OROMUCOSAL
  Filled 2020-11-07: qty 15

## 2020-11-07 MED ORDER — LACTATED RINGERS IV SOLN: INTRAVENOUS | Status: DC

## 2020-11-07 MED ORDER — ORAL CARE MOUTH RINSE: 15.0000 mL | Freq: Once | OROMUCOSAL | Status: AC

## 2020-11-07 MED ORDER — LIDOCAINE 2% (20 MG/ML) 5 ML SYRINGE
INTRAMUSCULAR | Status: DC | PRN
  Administered 2020-11-07: 60 mg via INTRAVENOUS

## 2020-11-07 MED ORDER — GADOBUTROL 1 MMOL/ML IV SOLN
8.0000 mL | Freq: Once | INTRAVENOUS | Status: AC | PRN
  Administered 2020-11-07: 8 mL via INTRAVENOUS

## 2020-11-07 NOTE — Progress Notes (Signed)
IMPRESSION: 1. Filling defect in the left transverse sinuses consistent with an arachnoid granulation. 2. Multiple rounded filling defects within the superior sagittal sinus and straight sinus may represent arachnoid granulations versus air embolism from peripheral vein cannulation.   PS: I am happy to offer CT venogram if PCP is on board with these plans, CD

## 2020-11-07 NOTE — H&P (Deleted)
  The note originally documented on this encounter has been moved the the encounter in which it belongs.  

## 2020-11-07 NOTE — Transfer of Care (Signed)
Immediate Anesthesia Transfer of Care Note  [name]  Procedure(s) Performed: MRV HEAD WITH AND WITHOUT (N/A ) CT LUMBAR SPINE WITHOUT (N/A )  Patient Location: PACU  Anesthesia Type:General  Level of Consciousness: awake, alert  and oriented  Airway & Oxygen Therapy: Patient Spontanous Breathing  Post-op Assessment: Report given to RN and Post -op Vital signs reviewed and stable  Post vital signs: Reviewed and stable  Last Vitals:  Vitals Value Taken Time  BP 167/94 11/07/20 0956  Temp    Pulse 109 11/07/20 0956  Resp 24 11/07/20 0956  SpO2 96 % 11/07/20 0956  Vitals shown include unvalidated device data.  Last Pain:  Vitals:   11/07/20 0712  PainSc: 0-No pain         Complications: No complications documented.

## 2020-11-07 NOTE — Progress Notes (Signed)
IMPRESSION:  please share with PCP, Dr Philip Aspen, MD.    Mild L2-4 and moderate L4-5 spinal canal narrowing.  Severe right L5-S1 and moderate bilateral L4-5 neural foraminal narrowing.  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 Follow-up ultrasound in 6-12 months is recommended.

## 2020-11-07 NOTE — Anesthesia Procedure Notes (Signed)
Procedure Name: LMA Insertion Date/Time: 11/07/2020 8:33 AM Performed by: Dorthea Cove, CRNA Pre-anesthesia Checklist: Patient identified, Emergency Drugs available, Suction available and Patient being monitored Patient Re-evaluated:Patient Re-evaluated prior to induction Oxygen Delivery Method: Circle System Utilized Preoxygenation: Pre-oxygenation with 100% oxygen Induction Type: IV induction Ventilation: Mask ventilation without difficulty LMA: LMA inserted LMA Size: 4.0 Number of attempts: 1 Airway Equipment and Method: Bite block Placement Confirmation: positive ETCO2 Tube secured with: Tape Dental Injury: Teeth and Oropharynx as per pre-operative assessment

## 2020-11-08 ENCOUNTER — Telehealth: Payer: Self-pay | Admitting: Neurology

## 2020-11-08 ENCOUNTER — Encounter (HOSPITAL_COMMUNITY): Payer: Self-pay | Admitting: Radiology

## 2020-11-08 NOTE — Anesthesia Postprocedure Evaluation (Signed)
Anesthesia Post Note  [name]  Procedure(s) Performed: MRV HEAD WITH AND WITHOUT (N/A ) CT LUMBAR SPINE WITHOUT (N/A )     Patient location during evaluation: PACU Anesthesia Type: General Level of consciousness: awake and alert Pain management: pain level controlled Vital Signs Assessment: post-procedure vital signs reviewed and stable Respiratory status: spontaneous breathing, nonlabored ventilation, respiratory function stable and patient connected to nasal cannula oxygen Cardiovascular status: blood pressure returned to baseline and stable Postop Assessment: no apparent nausea or vomiting Anesthetic complications: no   No complications documented.  Last Vitals:  Vitals:   11/07/20 1010 11/07/20 1015  BP: (!) 188/101 (!) 202/111  Pulse: 91 90  Resp: (!) 27 (!) 24  Temp:  36.5 C  SpO2: 93% 92%    Last Pain:  Vitals:   11/07/20 1015  PainSc: 0-No pain                 Powell Halbert S

## 2020-11-08 NOTE — Telephone Encounter (Signed)
-----   Message from Larey Seat, MD sent at 11/07/2020  3:01 PM EST ----- IMPRESSION:  please share with PCP, Dr Philip Aspen, MD.    Mild L2-4 and moderate L4-5 spinal canal narrowing.  Severe right L5-S1 and moderate bilateral L4-5 neural foraminal narrowing.  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 Follow-up ultrasound in 6-12 months is recommended.

## 2020-11-08 NOTE — Telephone Encounter (Signed)
Called the patient and reviewed the results from his nerve conduction study, MRI of the brain, MRI of the lumbar spine.  Advised the patient that there is narrowing on the spinal column in the lower part of his back.  Informed that we do not see any nerve impingement, but that if this becomes bothersome most likely would need a neurosurgery consult.  Incidentally there was a AAA found, she wants the patient to be aware of this and make sure that his primary care follows up on this.  She recommends a ultrasound within 6 to 12 months.  Advised the patient I would send copies of these reports to Dr. Sharlett Iles.  Patient was appreciative and verbalized understanding of the results.  Patient did not have any questions at this time but was informed to call back if he has any concerns.  Patient is scheduled for a follow-up visit.

## 2020-11-09 ENCOUNTER — Ambulatory Visit: Payer: Medicare Other | Attending: Physician Assistant

## 2020-11-09 ENCOUNTER — Ambulatory Visit: Payer: Medicare Other | Admitting: Occupational Therapy

## 2020-11-09 ENCOUNTER — Other Ambulatory Visit: Payer: Self-pay

## 2020-11-09 DIAGNOSIS — M6281 Muscle weakness (generalized): Secondary | ICD-10-CM

## 2020-11-09 DIAGNOSIS — R41844 Frontal lobe and executive function deficit: Secondary | ICD-10-CM

## 2020-11-09 DIAGNOSIS — R278 Other lack of coordination: Secondary | ICD-10-CM | POA: Insufficient documentation

## 2020-11-09 DIAGNOSIS — R2681 Unsteadiness on feet: Secondary | ICD-10-CM | POA: Diagnosis present

## 2020-11-09 DIAGNOSIS — R41840 Attention and concentration deficit: Secondary | ICD-10-CM | POA: Diagnosis present

## 2020-11-09 DIAGNOSIS — R41841 Cognitive communication deficit: Secondary | ICD-10-CM | POA: Insufficient documentation

## 2020-11-09 DIAGNOSIS — R2689 Other abnormalities of gait and mobility: Secondary | ICD-10-CM | POA: Diagnosis present

## 2020-11-09 NOTE — Patient Instructions (Signed)
  Coordination Activities  Perform the following activities for 20 minutes 1 times per day with left hand(s).   Rotate ball in fingertips (clockwise and counter-clockwise).  Flip cards 1 at a time as fast as you can.  Deal cards with your thumb (Hold deck in hand and push card off top with thumb).  Pick up coins and stack.  Pick up coins one at a time until you get 5-10 in your hand, then move coins from palm to fingertips to place in container one at a time.  Rotate ball in your hand

## 2020-11-09 NOTE — Therapy (Signed)
[facility] [address] [place], [place], [address] Phone: [phone]   Fax:  [phone]  Occupational Therapy Treatment  Patient Details  Name: [name] MRN: [id number] Date of Birth: July 19, 1934 Referring Provider (OT): Dr. Brett Fairy (referred by hospitalist with send to neurology)   Encounter Date: 11/09/2020   OT End of Session - 11/09/20 1547    Visit Number 2    Number of Visits 25    Date for OT Re-Evaluation 01/24/21    Authorization Type UHC Medicare    Authorization Time Period 90 days, anticipate d/c after 8 weeks    Authorization - Visit Number 2    Authorization - Number of Visits 10    OT Start Time 1537    OT Stop Time 1615    OT Time Calculation (min) 38 min    Activity Tolerance Patient tolerated treatment well           Past Medical History:  Diagnosis Date  . Afib ([place])   . Arthritis   . BPH (benign prostatic hyperplasia)   . COPD (chronic obstructive pulmonary disease) ([place])   . Coronary artery disease   . Coronary artery ectasia    Mild CAD with normal systolic function per cath in August of 2011  . Depression   . Diaphragmatic paralysis    felt to be partially responsible for dyspnea  . Dyspnea    due to paralyzed diaphragm and pulmonary issues  . Dysrhythmia   . GERD (gastroesophageal reflux disease)   . Gout   . HTN (hypertension)   . Hypercholesteremia   . Hypertension   . Iron deficiency anemia   . Microhematuria   . Obesity   . OSA (obstructive sleep apnea)    CPAP  . Paroxysmal atrial fibrillation ([facility])    occured in the setting of acute E Coli sepsis and ileius 7/12  . PVC (premature ventricular contraction)    s/p PVC ablation 05/29/2010    Past Surgical History:  Procedure Laterality Date  . APPENDECTOMY  1978  . BIOPSY  02/09/2019   Procedure: BIOPSY;  Surgeon: Clarene Essex, MD;  Location: [facility];  Service: Endoscopy;;  . BIOPSY  03/02/2019    Procedure: BIOPSY;  Surgeon: Clarene Essex, MD;  Location: [facility];  Service: Endoscopy;;  . CARDIAC CATHETERIZATION  04-30-2010   Left main coronary artery is normal.   . CARDIOVASCULAR STRESS TEST  03-19-2010   0%  . CHOLECYSTECTOMY  1990  . COLONOSCOPY N/A 03/02/2019   Procedure: COLONOSCOPY;  Surgeon: Clarene Essex, MD;  [facility];  Service: Endoscopy;  Laterality: N/A;  . COLONOSCOPY W/ POLYPECTOMY    . ESOPHAGOGASTRODUODENOSCOPY (EGD) WITH PROPOFOL N/A 02/09/2019   Procedure: ESOPHAGOGASTRODUODENOSCOPY (EGD) WITH PROPOFOL;  Surgeon: Clarene Essex, MD;  [facility];  Service: Endoscopy;  Laterality: N/A;  . EYE SURGERY Right    catarct  . poly removed from nose as a child    . PVC ablation  05/29/2010  . RADIOLOGY WITH ANESTHESIA N/A 11/07/2020   Procedure: MRV HEAD WITH AND WITHOUT;  Surgeon: Radiologist, Medication, MD;  Location: [facility];  Service: Radiology;  Laterality: N/A;  . RADIOLOGY WITH ANESTHESIA N/A 11/07/2020   Procedure: CT LUMBAR SPINE WITHOUT;  Surgeon: Radiologist, Medication, MD;  Location: [facility];  Service: Radiology;  Laterality: N/A;  . US ECHOCARDIOGRAPHY  03-09-2010   EF 55-60%    There were no vitals filed for this visit.   Subjective Assessment - 11/09/20  1547    Subjective  Pt denies pain    Pertinent History Pt had a fall while shopping in [employer] on 08/15/20. Pt sustained a parafalcine and tentorial SDH. Was discharged from the hospital on 08/17/20.PMH:HTN, CAD, COPD, A-fib    Patient Stated Goals improve balance and strength    Currently in Pain? No/denies              Treatment Strategies for ADLs reviewed with pt/ wife. Tub bench transfer with close supervision and min v.c for use of leg lifting.Pt/ wife were instructed in donning pants on legs with reacher and use of sockaide, demonstration and min v.c for use. Pt and wife verbalize understanding, pt returned demonstration with min v.c. Pt may benefit from  reinforcement                  OT Education - 11/09/20 1635    Education Details coordination HEP- see pt instructions.    Person(s) Educated Patient    Methods Explanation;Demonstration;Verbal cues;Handout    Comprehension Verbalized understanding;Returned demonstration;Verbal cues required            OT Short Term Goals - 11/01/20 0830      OT SHORT TERM GOAL #1   Title Pt/ caregeiver will be I with HEP. 11/29/20    Time 4    Period Weeks    Status New    Target Date 11/29/20      OT SHORT TERM GOAL #2   Title Pt / wife will verbalize understanding of strategies to increase safety and I with ADLS.    Time 4    Period Weeks    Status New      OT SHORT TERM GOAL #3   Title Pt will perfrom transitional movments for ADLS with close supervision and no LOB.    Baseline histroy of falls    Time 4    Period Weeks    Status New      OT SHORT TERM GOAL #4   Title Pt will demonstrate improved fine motor coordination in his LUE as eveidenced by decreasing 9-hole peg test score by 4 secs.    Time 4    Period Weeks    Status New      OT SHORT TERM GOAL #5   Title Pt will sequence a simple functional or ADL task  with 90% or better accuracy .    Time 4    Period Weeks    Status New      Additional Short Term Goals   Additional Short Term Goals Yes      OT SHORT TERM GOAL #6   Title Pt will demonstrate improved fine motor coordination for ADLs as evidenced by decreasing 3 button/ un button time to 90 secs or less.    Time 4    Period Weeks    Status New             OT Long Term Goals - 11/01/20 8921      OT LONG TERM GOAL #1   Title Pt/ wife will be I with updated HEP for proximal strength.    Time 12    Period Weeks    Status New    Target Date 01/24/21      OT LONG TERM GOAL #2   Title Pt will demonstrate ability to perform simple beverage / snack prep modifeied indpendent demonstraing good saftey awareness.    Time 12    Period Weeks  Status New      OT LONG TERM GOAL #3   Title Pt will perforrm bathing and  dressing mod I in a reasonable amout of time    Baseline increased time required    Time 12    Period Weeks    Status New      OT LONG TERM GOAL #4   Title Pt will demonstrate adequate bilateral UE strength to retrieve a lightweight object (3 lbs) from eye level  shelf without dropping with right and left UE's individually.    Time 12    Period Weeks    Status New      OT LONG TERM GOAL #5   Title Pt will increase standing functional reach test score to 10 inches or greater with RUE to minimize fall risk during ADLs.    Time 12    Period Weeks    Status New                 Plan - 11/09/20 1548    Clinical Impression Statement Pt is progressing towards goals for fine motor coordiation and ADLS.    OT Occupational Profile and History Detailed Assessment- Review of Records and additional review of physical, cognitive, psychosocial history related to current functional performance    Occupational performance deficits (Please refer to evaluation for details): ADL's;IADL's;Leisure;Social Participation    Body Structure / Function / Physical Skills ADL;UE functional use;Balance;FMC;GMC;Coordination;Decreased knowledge of use of DME;IADL;Dexterity;Strength;Mobility    Cognitive Skills Memory;Safety Awareness;Problem Solve;Sequencing;Thought;Understand;Attention    Rehab Potential Good    OT Frequency 2x / week    OT Duration 12 weeks    OT Treatment/Interventions Self-care/ADL training;Energy conservation;Patient/family education;DME and/or AE instruction;Aquatic Therapy;Paraffin;Passive range of motion;Balance training;Fluidtherapy;Cryotherapy;Therapist, nutritional;Therapeutic activities;Manual Therapy;Therapeutic exercise;Moist Heat;Neuromuscular education;Cognitive remediation/compensation    Plan reinforce strategies for safety with ADLs may want to ask wife to come in from lobby, , simple standing  and functional reach- use gait belt, fine motor coordination left side    Consulted and Agree with Plan of Care Patient;Family member/caregiver    Family Member Consulted wife           Patient will benefit from skilled therapeutic intervention in order to improve the following deficits and impairments:   Body Structure / Function / Physical Skills: ADL,UE functional use,Balance,FMC,GMC,Coordination,Decreased knowledge of use of DME,IADL,Dexterity,Strength,Mobility Cognitive Skills: Memory,Safety Awareness,Problem Solve,Sequencing,Thought,Understand,Attention     Visit Diagnosis: Muscle weakness (generalized)  Other lack of coordination  Frontal lobe and executive function deficit  Attention and concentration deficit    Problem List Patient Active Problem List   Diagnosis Date Noted  . Subdural hematoma ([place]) 08/15/2020  . Iron deficiency anemia due to chronic blood loss 01/06/2019  . Orthostatic hypotension 01/05/2019    Class: Question of  . Normocytic normochromic anemia 01/05/2019  . Near syncope 01/05/2019  . Chronic respiratory failure with hypoxia ([place]) 06/30/2018  . OSA (obstructive sleep apnea) 02/11/2018  . Physical deconditioning 08/11/2017  . Shortness of breath 10/24/2014  . COPD (chronic obstructive pulmonary disease) with emphysema ([place]) 09/01/2014  . Fatigue 10/14/2011  . Atrial fibrillation ([place]) 05/15/2011  . Premature ventricular contractions (PVCs) ([facility]) 02/25/2011  . 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  . PERSISTENT DISORDER INITIATING/MAINTAINING SLEEP 05/10/2010  . HYPERCHOLESTEROLEMIA 05/09/2010  . GOUT 05/09/2010  . OBESITY 05/09/2010  . Obstructive sleep apnea 05/09/2010  . Essential hypertension 05/09/2010  . VENTRICULAR TACHYCARDIA 05/09/2010    Ileane Sando 11/09/2020, 4:36 PM  [facility] [address]  [place], [place], [address] Phone: [phone]   Fax:   [phone]  Name: [name] MRN: [id number] Date of Birth: Nov 13, 1933

## 2020-11-09 NOTE — Therapy (Signed)
[facility] [address] [place], [place], [address] Phone: [phone]   Fax:  [phone]  Physical Therapy Treatment  Patient Details  Name: [name] MRN: [id number] Date of Birth: December 24, 1933 Referring Provider (PT): Dohmeier, Asencion Partridge, MD (is being followed by, was referred by hospitalist)   Encounter Date: 11/09/2020   PT End of Session - 11/09/20 1600    Visit Number 4    Number of Visits 17    Date for PT Re-Evaluation 01/22/21    Authorization Type UHC Medicare    PT Start Time 6073    PT Stop Time 1530    PT Time Calculation (min) 45 min    Equipment Utilized During Treatment Gait belt    Activity Tolerance Patient tolerated treatment well    Behavior During Therapy Impulsive           Past Medical History:  Diagnosis Date  . Afib ([place])   . Arthritis   . BPH (benign prostatic hyperplasia)   . COPD (chronic obstructive pulmonary disease) ([place])   . Coronary artery disease   . Coronary artery ectasia    Mild CAD with normal systolic function per cath in August of 2011  . Depression   . Diaphragmatic paralysis    felt to be partially responsible for dyspnea  . Dyspnea    due to paralyzed diaphragm and pulmonary issues  . Dysrhythmia   . GERD (gastroesophageal reflux disease)   . Gout   . HTN (hypertension)   . Hypercholesteremia   . Hypertension   . Iron deficiency anemia   . Microhematuria   . Obesity   . OSA (obstructive sleep apnea)    CPAP  . Paroxysmal atrial fibrillation ([facility])    occured in the setting of acute E Coli sepsis and ileius 7/12  . PVC (premature ventricular contraction)    s/p PVC ablation 05/29/2010    Past Surgical History:  Procedure Laterality Date  . APPENDECTOMY  1978  . BIOPSY  02/09/2019   Procedure: BIOPSY;  Surgeon: Clarene Essex, MD;  [facility];  Service: Endoscopy;;  . BIOPSY  03/02/2019   Procedure: BIOPSY;  Surgeon: Clarene Essex, MD;   [facility];  Service: Endoscopy;;  . CARDIAC CATHETERIZATION  04-30-2010   Left main coronary artery is normal.   . CARDIOVASCULAR STRESS TEST  03-19-2010   0%  . CHOLECYSTECTOMY  1990  . COLONOSCOPY N/A 03/02/2019   Procedure: COLONOSCOPY;  Surgeon: Clarene Essex, MD;  [facility];  Service: Endoscopy;  Laterality: N/A;  . COLONOSCOPY W/ POLYPECTOMY    . ESOPHAGOGASTRODUODENOSCOPY (EGD) WITH PROPOFOL N/A 02/09/2019   Procedure: ESOPHAGOGASTRODUODENOSCOPY (EGD) WITH PROPOFOL;  Surgeon: Clarene Essex, MD;  [facility];  Service: Endoscopy;  Laterality: N/A;  . EYE SURGERY Right    catarct  . poly removed from nose as a child    . PVC ablation  05/29/2010  . RADIOLOGY WITH ANESTHESIA N/A 11/07/2020   Procedure: MRV HEAD WITH AND WITHOUT;  Surgeon: Radiologist, Medication, MD;  Location: [facility];  Service: Radiology;  Laterality: N/A;  . RADIOLOGY WITH ANESTHESIA N/A 11/07/2020   Procedure: CT LUMBAR SPINE WITHOUT;  Surgeon: Radiologist, Medication, MD;  Location: [facility];  Service: Radiology;  Laterality: N/A;  . US ECHOCARDIOGRAPHY  03-09-2010   EF 55-60%    There were no vitals filed for this visit.   Subjective Assessment - 11/09/20 1558    Subjective no falls, pain or med changes to  report, has been using cane for mobility upstairs and has not fallen or experienced any LOB, feels confident using cane in close quarters    Patient is accompained by: Family member   wife, [name]   Pertinent History HTN, CAD, COPD, A-fib    Limitations Standing;Walking    How long can you sit comfortably? 30'    How long can you stand comfortably? 69'    How long can you walk comfortably? 87'    Patient Stated Goals wants to improve his balance    Currently in Pain? No/denies                             [facility] Adult PT Treatment/Exercise - 11/09/20 0001      Knee/Hip Exercises: Aerobic   Nustep 8' at L2      Knee/Hip Exercises: Seated   Marching  Strengthening;Both;1 set;10 reps;Limitations    Marching Limitations in seated , performed placement of ea. foot on airex in A/P and medial to lateral directions, 10x ea. position to facilitate stepping               Balance Exercises - 11/09/20 0001      Balance Exercises: Standing   Rockerboard Anterior/posterior;Lateral;EO;30 seconds;UE support    Rockerboard Limitations 30s x2 ea. position single UEsupport    Retro Gait Foam/compliant surface;5 reps;Limitations    Retro Gait Limitations single UE support across blue mat    Sidestepping Foam/compliant support;5 reps;Limitations    Sidestepping Limitations 5 trips across blue mat with single UE support    Step Over Hurdles / Cones 4 low profile hurdles setup, 29ft x2, attempted high profile hurdles but unable to negotiate, [facility] with CGA      Balance Exercises: Seated   Other Seated Exercises seated, pushing table forward to WS and stretch paraspinals than rotational motions for 10 reps ea. with CGA for seated support               PT Short Term Goals - 11/02/20 1641      PT SHORT TERM GOAL #1   Title Pt will initiate HEP in order to indicate improved functional mobility and decreased fall risk.  ALL STGS DUE 11/21/20    Time 4    Period Weeks    Status New    Target Date 11/21/20      PT SHORT TERM GOAL #2   Title Pt will perform 5x sit <> stand in 17 seconds or less with BUE support in order to demo improved functional strength.    Baseline 19.31 seconds    Time 4    Period Weeks    Status New      PT SHORT TERM GOAL #3   Title Pt will undergo BERG with STG and LTG written as appropriate.    Baseline 10/27/20 BERG 37/56    Time 4    Period Weeks    Status Achieved      PT SHORT TERM GOAL #4   Title Pt will improve gait speed with rollator to at least 2.5 ft/sec in order demo improved community mobility.    Baseline 2.18 ft/sec    Time 4    Period Weeks    Status New      PT SHORT TERM GOAL #5   Title Pt  and pt's spouse will verbalize understanding of fall prevention in the home to decr fall risk.    Time 4  Period Weeks    Status New             PT Long Term Goals - 10/27/20 1827      PT LONG TERM GOAL #1   Title Pt will be independent with final HEP in order to indicate improved functional mobility and decreased fall risk.  ALL LTGS DUE 12/19/20    Time 8    Period Weeks    Status New      PT LONG TERM GOAL #2   Title BERG goal to be written as appropriate to determine decr fall risk.  BERG goal is 45/56    Baseline 37    Time 8    Period Weeks    Status New      PT LONG TERM GOAL #3   Title Pt will perform 5x sit <> stand in 14.5 seconds or less with BUE vs. single UE support in order to demo improved functional strength.    Baseline 19.31 seconds    Time 8    Period Weeks    Status New      PT LONG TERM GOAL #4   Title Pt will ambulate at least 150' over indoor surfaces with cane vs. LRAD with supervision in order to demo improved household mobility.    Time 8    Period Weeks    Status New      PT LONG TERM GOAL #5   Title Pt will perform TUG in 14.5 seconds or less with rollator vs. LRAD in order to demo decr fall risk.    Baseline 16.94 seconds with rollator.    Time 8    Period Weeks    Status New                 Plan - 11/09/20 1600    Clinical Impression Statement patient arrives with no pain, ha been using cane more at home, demos difficulty flexing B hips to clear obsatcles/hurdles and is unable to negotiate tall hurdles, worked on stepping tasks in sitting as well as hurdle negoiation with cane, advanced to ambulation across compliant surfaces in // bars and rockerboard challenges, delayed righting reactions noted but gross LOB    Personal Factors and Comorbidities Comorbidity 3+;Past/Current Experience    Comorbidities HTN, CAD, COPD, A-fib, hx of COVID march 2021    Examination-Activity Limitations Stand;Squat;Transfers;Reach  Overhead;Locomotion Level    Examination-Participation Restrictions Community Activity;Driving    Stability/Clinical Decision Making Evolving/Moderate complexity    Rehab Potential Good    PT Frequency 2x / week    PT Duration 8 weeks    PT Treatment/Interventions ADLs/Self Care Home Management;DME Instruction;Gait training;Functional mobility training;Stair training;Neuromuscular re-education;Balance training;Therapeutic exercise;Therapeutic activities;Patient/family education;Vestibular;Energy conservation    PT Next Visit Plan address hip flexion and stepping strategies, continue to work on static balance challenges and ambulation with cane across challenging surfaces, STS tasks as tolerated and monitor O2 sats if warranted    Consulted and Agree with Plan of Care Patient           Patient will benefit from skilled therapeutic intervention in order to improve the following deficits and impairments:  Abnormal gait,Decreased activity tolerance,Decreased balance,Decreased endurance,Decreased coordination,Decreased cognition,Decreased knowledge of use of DME,Decreased range of motion,Difficulty walking,Decreased safety awareness,Decreased strength  Visit Diagnosis: Unsteadiness on feet  Muscle weakness (generalized)     Problem List Patient Active Problem List   Diagnosis Date Noted  . Subdural hematoma ([place]) 08/15/2020  . Iron deficiency anemia due to chronic blood  loss 01/06/2019  . Orthostatic hypotension 01/05/2019    Class: Question of  . Normocytic normochromic anemia 01/05/2019  . Near syncope 01/05/2019  . Chronic respiratory failure with hypoxia ([place]) 06/30/2018  . OSA (obstructive sleep apnea) 02/11/2018  . Physical deconditioning 08/11/2017  . Shortness of breath 10/24/2014  . COPD (chronic obstructive pulmonary disease) with emphysema ([place]) 09/01/2014  . Fatigue 10/14/2011  . Atrial fibrillation ([place]) 05/15/2011  . Premature ventricular contractions (PVCs) ([facility])  02/25/2011  . 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  . PERSISTENT DISORDER INITIATING/MAINTAINING SLEEP 05/10/2010  . HYPERCHOLESTEROLEMIA 05/09/2010  . GOUT 05/09/2010  . OBESITY 05/09/2010  . Obstructive sleep apnea 05/09/2010  . Essential hypertension 05/09/2010  . VENTRICULAR TACHYCARDIA 05/09/2010    Lanice Shirts PT 11/09/2020, 4:10 PM  [facility] [address] [place], [place], [address] Phone: [phone]   Fax:  [phone]  Name: MICHELLE VANHISE MRN: [id number] Date of Birth: Aug 10, 1934

## 2020-11-14 ENCOUNTER — Other Ambulatory Visit: Payer: Self-pay

## 2020-11-14 ENCOUNTER — Ambulatory Visit: Payer: Medicare Other | Admitting: Physician Assistant

## 2020-11-14 ENCOUNTER — Ambulatory Visit: Payer: Medicare Other | Admitting: Occupational Therapy

## 2020-11-14 ENCOUNTER — Encounter: Payer: Self-pay | Admitting: Occupational Therapy

## 2020-11-14 ENCOUNTER — Encounter: Payer: Self-pay | Admitting: Physician Assistant

## 2020-11-14 ENCOUNTER — Ambulatory Visit: Payer: Medicare Other

## 2020-11-14 VITALS — BP 160/78 | HR 79 | Ht 70.0 in | Wt 194.8 lb

## 2020-11-14 DIAGNOSIS — I480 Paroxysmal atrial fibrillation: Secondary | ICD-10-CM | POA: Diagnosis not present

## 2020-11-14 DIAGNOSIS — R2681 Unsteadiness on feet: Secondary | ICD-10-CM | POA: Diagnosis not present

## 2020-11-14 DIAGNOSIS — R278 Other lack of coordination: Secondary | ICD-10-CM

## 2020-11-14 DIAGNOSIS — I10 Essential (primary) hypertension: Secondary | ICD-10-CM

## 2020-11-14 DIAGNOSIS — M6281 Muscle weakness (generalized): Secondary | ICD-10-CM

## 2020-11-14 DIAGNOSIS — J432 Centrilobular emphysema: Secondary | ICD-10-CM

## 2020-11-14 DIAGNOSIS — R41844 Frontal lobe and executive function deficit: Secondary | ICD-10-CM

## 2020-11-14 DIAGNOSIS — I714 Abdominal aortic aneurysm, without rupture, unspecified: Secondary | ICD-10-CM | POA: Insufficient documentation

## 2020-11-14 DIAGNOSIS — I2510 Atherosclerotic heart disease of native coronary artery without angina pectoris: Secondary | ICD-10-CM | POA: Diagnosis not present

## 2020-11-14 DIAGNOSIS — Z8719 Personal history of other diseases of the digestive system: Secondary | ICD-10-CM

## 2020-11-14 DIAGNOSIS — Z9181 History of falling: Secondary | ICD-10-CM

## 2020-11-14 DIAGNOSIS — R41840 Attention and concentration deficit: Secondary | ICD-10-CM

## 2020-11-14 MED ORDER — HYDRALAZINE HCL 25 MG PO TABS: 25.0000 mg | ORAL_TABLET | Freq: Three times a day (TID) | ORAL | 1 refills | Status: DC

## 2020-11-14 NOTE — Progress Notes (Signed)
Cardiology Office Note:    Date:  11/14/2020   ID:  [name], DOB 07-07-1934, MRN [id number]  PCP:  [name], [place]  Cardiologist:  Thompson Grayer, MD   Advanced Practice Provider:  Liliane Shi, PA-C  Pulmonologist: Dr. Ander Slade Neurologist: Dr. Brett Fairy  Referring MD: [name], MD   Chief Complaint:  Follow-up (CAD, AFib, HTN)    Patient Profile:    [name] is a 87 y.o. male with:   Coronary artery disease   Non-obstructive by cath in 2011  PVCs  S/p RF ablation in 05/2010  Paroxysmal atrial fibrillation  Off anticoagulation due GI bleeding, frequent falls  Abdominal aortic aneurysm  3.2 cm by MRI in 3/22 >> f/u US rec in 6-12 mos  Chronic dyspnea   Stage IV COPD  Chronic respiratory failure, on chronic O2  Paralyzed hemidiaphragm  Hypertension   Hyperlipidemia   OSA  Hx of falls  Neurology work up ongoing; EMG with primary motor neuropathy; MRI with severe R L5-S1 foraminal narrowing  ETOH abuse  Depression   Prior CV studies: Echocardiogram 12/08/2019 EF 55-60, no RWMA, moderate LVH, normal RVSF, RVSP 35.7, severe LAE, mild MR, trivial AI, AV sclerosis without stenosis  48-hour Holter 02/2018 Sinus rhythm with first degree AV block, Right bundle branch block Average heart rate is 62 bpm Occasional premature ventricular contractions Frequent premature atrial contractions with nonsustained atrial tachycardia Nonsustained atrial fibrillation also observed Nocturnal bradycardia with mobitz I second degree AV block is noted transiently No sustained arrhythmias No prolonged pauses  Myoview 11/08/2014 EF 63, probable diaphragmatic attenuation, no ischemia, low risk  Cardiac catheterization 04/30/2010 Mild diffuse coronary artery ectasia but no significant stenosis  History of Present Illness:    [name] was last seen by [name] in 2/22.  The patient has been seen by neurology given  his history of falls and weakness.  Of note, he did have a spinal MRI which demonstrated a small AAA.  This will need follow-up.  He returns for follow-up.  He is here with his wife.  Since last seen, he has not had chest discomfort.  He has chronic shortness of breath without significant change.  He sleeps on a slight incline.  He has mild pedal edema without significant change.  He has not had syncope.  His blood pressures fluctuate at home.  He has systolic readings in the 36U at times.      Past Medical History:  Diagnosis Date  . Afib ([place])   . Arthritis   . BPH (benign prostatic hyperplasia)   . COPD (chronic obstructive pulmonary disease) ([place])   . Coronary artery disease   . Coronary artery ectasia    Mild CAD with normal systolic function per cath in August of 2011  . Depression   . Diaphragmatic paralysis    felt to be partially responsible for dyspnea  . Dyspnea    due to paralyzed diaphragm and pulmonary issues  . Dysrhythmia   . GERD (gastroesophageal reflux disease)   . Gout   . HTN (hypertension)   . Hypercholesteremia   . Hypertension   . Iron deficiency anemia   . Microhematuria   . Obesity   . OSA (obstructive sleep apnea)    CPAP  . Paroxysmal atrial fibrillation (HCC)    occured in the setting of acute E Coli sepsis and ileius 7/12  . PVC (premature ventricular contraction)    s/p PVC ablation 05/29/2010  Current Medications: Current Meds  Medication Sig  . acetaminophen (TYLENOL) 325 MG tablet Take 2 tablets (650 mg total) by mouth every 6 (six) hours as needed for mild pain.  Marland Kitchen allopurinol (ZYLOPRIM) 300 MG tablet Take 300 mg by mouth daily.  [name] 62.5-25 MCG/INH AEPB Inhale 1 puff into the lungs daily.  Marland Kitchen buPROPion (WELLBUTRIN SR) 100 MG 12 hr tablet Take 100 mg by mouth 2 (two) times daily.   Marland Kitchen doxazosin (CARDURA) 4 MG tablet Take 0.5 tablets (2 mg total) by mouth at bedtime.  . ferrous sulfate 325 (65 FE) MG tablet Take 325 mg by mouth  daily with breakfast.  . hydrALAZINE (APRESOLINE) 25 MG tablet Take 1 tablet (25 mg total) by mouth 3 (three) times daily.  . hydrochlorothiazide (HYDRODIURIL) 25 MG tablet Take 25 mg by mouth daily.  Marland Kitchen lovastatin (MEVACOR) 40 MG tablet Take 40 mg by mouth daily.  . OXYGEN Inhale 2 L/min into the lungs daily as needed (SOB).   . pantoprazole (PROTONIX) 40 MG tablet Take 40 mg by mouth daily.  . sertraline (ZOLOFT) 100 MG tablet Take 100 mg by mouth daily.  . traZODone (DESYREL) 100 MG tablet Take 100 mg by mouth at bedtime.   . vitamin B-12 (CYANOCOBALAMIN) 500 MCG tablet Take 500 mcg by mouth daily.     Allergies:   Patient has no known allergies.   Social History   Tobacco Use  . Smoking status: Former Smoker    Types: Cigarettes    Quit date: 1985    Years since quitting: 37.2  . Smokeless tobacco: Never Used  Vaping Use  . Vaping Use: Never used  Substance Use Topics  . Alcohol use: Not Currently  . Drug use: Never     Family Hx: The patient's family history includes Aneurysm (age of onset: 14) in his father; Breast cancer in his daughter and mother.  ROS   EKGs/Labs/Other Test Reviewed:    EKG:  EKG is   ordered today.  The ekg ordered today demonstrates atrial fibrillation, HR 79, incomplete right bundle branch block, IVCD, QTC 506, similar to prior tracings  Recent Labs: 10/17/2020: ALT 17; TSH 2.090 11/07/2020: BUN 30; Creatinine, Ser 1.03; Hemoglobin 12.9; Platelets 176; Potassium 3.7; Sodium 142   Recent Lipid Panel No results found for: CHOL, TRIG, HDL, CHOLHDL, LDLCALC, LDLDIRECT    Risk Assessment/Calculations:    CHA2DS2-VASc Score = 4  This indicates a 4.8% annual risk of stroke. The patient's score is based upon: CHF History: No HTN History: Yes Diabetes History: No Stroke History: No Vascular Disease History: Yes Age Score: 2 Gender Score: 0     Physical Exam:    VS:  BP (!) 160/78   Pulse 79   Ht 5\' 10"  (1.778 m)   Wt 194 lb 12.8 oz  (88.4 kg)   SpO2 94%   BMI 27.95 kg/m     Wt Readings from Last 3 Encounters:  11/14/20 194 lb 12.8 oz (88.4 kg)  11/07/20 190 lb (86.2 kg)  10/17/20 194 lb (88 kg)     Constitutional:      Appearance: Healthy appearance. Not in distress.  Pulmonary:     Effort: Pulmonary effort is normal.     Breath sounds: No wheezing. No rales.  Cardiovascular:     Normal rate. Irregularly irregular rhythm. Normal S1. Normal S2.     Murmurs: There is no murmur.  Edema:    Ankle: bilateral trace edema of the ankle.  Abdominal:     Palpations: Abdomen is soft.  Musculoskeletal:     Cervical back: Neck supple. Skin:    General: Skin is warm and dry.  Neurological:     General: No focal deficit present.     Mental Status: Alert and oriented to person, place and time.     Cranial Nerves: Cranial nerves are intact.      ASSESSMENT & PLAN:    1. PAF (paroxysmal atrial fibrillation) ([place]) He seems to be in persistent/permanent atrial fibrillation.  He was taken off of anticoagulation in 2020 after being hospitalized with a GI bleed.  I reviewed his notes with him today.  [name] did consider placing him back on Apixaban in July 2020.  However, the patient was hesitant to resume and he has been off ever since.  Recently, he has had significant falls and had a subdural hematoma last year.  His wife is concerned about stroke risk.  I am not certain he would be a candidate for watchman but I will review further with Dr. [name] to see if this could be considered.  2. Coronary artery disease involving native coronary artery of native heart without angina pectoris Mild plaque by cardiac catheterization in 2011.  Myoview in 2016 was low risk.  He is not having anginal symptoms.  Continue lovastatin.  3. AAA (abdominal aortic aneurysm) without rupture ([place]) Small AAA noted on MRI.  Arrange follow-up AAA ultrasound in 6 months  4. Centrilobular emphysema ([place]) He is followed by pulmonology.  He is on O2 at  night.  5. Essential hypertension He has labile hypertension.  Blood pressure medications have been adjusted several times in the past.  I will give him hydralazine 25 mg to take if his blood pressure is greater than/equal to 160/95.  Continue current dose of hydrochlorothiazide.  6. History of GI bleed He does have a lot of gastrointestinal symptoms.  He has not seen GI since 2020.  I have encouraged him to return for follow-up.  7. Personal history of fall He is currently being evaluated by neurology for possible neuropathy.      Dispo:  Return in about 3 months (around 02/14/2021) for Routine Follow Up, w/ Richardson Dopp, PA-C, in person.   Medication Adjustments/Labs and Tests Ordered: Current medicines are reviewed at length with the patient today.  Concerns regarding medicines are outlined above.  Tests Ordered: Orders Placed This Encounter  Procedures  . EKG 12-Lead   Medication Changes: Meds ordered this encounter  Medications  . hydrALAZINE (APRESOLINE) 25 MG tablet    Sig: Take 1 tablet (25 mg total) by mouth 3 (three) times daily.    Dispense:  30 tablet    Refill:  1    Signed, Richardson Dopp, PA-C  11/14/2020 4:50 PM    [facility] [place], [place], [place]  [address] Phone: [phone]; Fax: [phone]

## 2020-11-14 NOTE — Therapy (Signed)
[facility] [address] [place], [place], [address] Phone: [phone]   Fax:  [phone]  Occupational Therapy Treatment  Patient Details  Name: [name] MRN: [id number] Date of Birth: 1933-12-12 Referring Provider (OT): Dr. Brett Fairy (referred by hospitalist with send to neurology)   Encounter Date: 11/14/2020   OT End of Session - 11/14/20 1614    Visit Number 3    Number of Visits 25    Date for OT Re-Evaluation 01/24/21    Authorization Type [employer] Medicare    Authorization Time Period 90 days, anticipate d/c after 8 weeks    Authorization - Visit Number 3    Authorization - Number of Visits 10    OT Start Time 0109    OT Stop Time 3235    OT Time Calculation (min) 38 min    Activity Tolerance Patient tolerated treatment well           Past Medical History:  Diagnosis Date  . Afib ([place])   . Arthritis   . BPH (benign prostatic hyperplasia)   . COPD (chronic obstructive pulmonary disease) ([place])   . Coronary artery disease   . Coronary artery ectasia    Mild CAD with normal systolic function per cath in August of 2011  . Depression   . Diaphragmatic paralysis    felt to be partially responsible for dyspnea  . Dyspnea    due to paralyzed diaphragm and pulmonary issues  . Dysrhythmia   . GERD (gastroesophageal reflux disease)   . Gout   . HTN (hypertension)   . Hypercholesteremia   . Hypertension   . Iron deficiency anemia   . Microhematuria   . Obesity   . OSA (obstructive sleep apnea)    CPAP  . Paroxysmal atrial fibrillation (HCC)    occured in the setting of acute E Coli sepsis and ileius 7/12  . PVC (premature ventricular contraction)    s/p PVC ablation 05/29/2010    Past Surgical History:  Procedure Laterality Date  . APPENDECTOMY  1978  . BIOPSY  02/09/2019   Procedure: BIOPSY;  Surgeon: Clarene Essex, MD;  Location: [facility];  Service: Endoscopy;;  . BIOPSY  03/02/2019    Procedure: BIOPSY;  Surgeon: Clarene Essex, MD;  Location: [facility];  Service: Endoscopy;;  . CARDIAC CATHETERIZATION  04-30-2010   Left main coronary artery is normal.   . CARDIOVASCULAR STRESS TEST  03-19-2010   0%  . CHOLECYSTECTOMY  1990  . COLONOSCOPY N/A 03/02/2019   Procedure: COLONOSCOPY;  Surgeon: Clarene Essex, MD;  Location: [facility];  Service: Endoscopy;  Laterality: N/A;  . COLONOSCOPY W/ POLYPECTOMY    . ESOPHAGOGASTRODUODENOSCOPY (EGD) WITH PROPOFOL N/A 02/09/2019   Procedure: ESOPHAGOGASTRODUODENOSCOPY (EGD) WITH PROPOFOL;  Surgeon: Clarene Essex, MD;  Location: [facility];  Service: Endoscopy;  Laterality: N/A;  . EYE SURGERY Right    catarct  . poly removed from nose as a child    . PVC ablation  05/29/2010  . RADIOLOGY WITH ANESTHESIA N/A 11/07/2020   Procedure: MRV HEAD WITH AND WITHOUT;  Surgeon: Radiologist, Medication, MD;  Location: [facility];  Service: Radiology;  Laterality: N/A;  . RADIOLOGY WITH ANESTHESIA N/A 11/07/2020   Procedure: CT LUMBAR SPINE WITHOUT;  Surgeon: Radiologist, Medication, MD;  Location: [facility];  Service: Radiology;  Laterality: N/A;  . US ECHOCARDIOGRAPHY  03-09-2010   EF 55-60%    There were no vitals filed for this visit.   Subjective Assessment - 11/14/20  1615    Subjective  Pt reports things will get easier when he gets his sock aid cause he ordered one and it should be here tomorrow.    Pertinent History Pt had a fall while shopping in [employer] on 08/15/20. Pt sustained a parafalcine and tentorial SDH. Was discharged from the hospital on 08/17/20.PMH:HTN, CAD, COPD, A-fib    Patient Stated Goals improve balance and strength    Currently in Pain? No/denies                        OT Treatments/Exercises (OP) - 11/14/20 1628      Exercises   Exercises Hand      Functional Reaching Activities   Mid Level dynamic reach with resistance clothespins. Pt placed on antenna with LUE with standing directectly in front and then reached  outside of midline to left to place clothespins 1-8# with LUE with no LOB      Fine Motor Coordination (Hand/Wrist)   Fine Motor Coordination Small Pegboard;Grooved pegs    Small Pegboard with LUE for copying pattern and placing into peg board. Pt with min drops and min difficulty. Pt reported he was color blind so errors of wrong colors were not taken into account    Grooved pegs with LUE for increase in coordination and in hand manipulation. Pt with increased time to complete task and min difficulty. Pt with uncoordinated movements iwth placing pegs into board and with rotating pegs in hand                    OT Short Term Goals - 11/14/20 1645      OT SHORT TERM GOAL #1   Title Pt/ caregeiver will be I with HEP. 11/29/20    Time 4    Period Weeks    Status On-going    [employer] Date 11/29/20      OT SHORT TERM GOAL #2   Title Pt / wife will verbalize understanding of strategies to increase safety and I with ADLS.    Time 4    Period Weeks    Status On-going      OT SHORT TERM GOAL #3   Title Pt will perfrom transitional movments for ADLS with close supervision and no LOB.    Baseline histroy of falls    Time 4    Period Weeks    Status On-going      OT SHORT TERM GOAL #4   Title Pt will demonstrate improved fine motor coordination in his LUE as eveidenced by decreasing 9-hole peg test score by 4 secs.    Time 4    Period Weeks    Status On-going      OT SHORT TERM GOAL #5   Title Pt will sequence a simple functional or ADL task  with 90% or better accuracy .    Time 4    Period Weeks    Status On-going      OT SHORT TERM GOAL #6   Title Pt will demonstrate improved fine motor coordination for ADLs as evidenced by decreasing 3 button/ unbutton time to 90 secs or less.    Time 4    Period Weeks    Status On-going             OT Long Term Goals - 11/01/20 1324      OT LONG TERM GOAL #1   Title Pt/ wife will be I with updated HEP for  proximal strength.     Time 12    Period Weeks    Status New    [employer] Date 01/24/21      OT LONG TERM GOAL #2   Title Pt will demonstrate ability to perform simple beverage / snack prep modifeied indpendent demonstraing good saftey awareness.    Time 12    Period Weeks    Status New      OT LONG TERM GOAL #3   Title Pt will perforrm bathing and  dressing mod I in a reasonable amout of time    Baseline increased time required    Time 12    Period Weeks    Status New      OT LONG TERM GOAL #4   Title Pt will demonstrate adequate bilateral UE strength to retrieve a lightweight object (3 lbs) from eye level  shelf without dropping with right and left UE's individually.    Time 12    Period Weeks    Status New      OT LONG TERM GOAL #5   Title Pt will increase standing functional reach test score to 10 inches or greater with RUE to minimize fall risk during ADLs.    Time 12    Period Weeks    Status New                 Plan - 11/14/20 1614    Clinical Impression Statement Pt is progressing towards goals. Pt receptive to adaptive strategies and has already ordered equipment for increasing independence with ADLs.    OT Occupational Profile and History Detailed Assessment- Review of Records and additional review of physical, cognitive, psychosocial history related to current functional performance    Occupational performance deficits (Please refer to evaluation for details): ADL's;IADL's;Leisure;Social Participation    Body Structure / Function / Physical Skills ADL;UE functional use;Balance;FMC;GMC;Coordination;Decreased knowledge of use of DME;IADL;Dexterity;Strength;Mobility    Cognitive Skills Memory;Safety Awareness;Problem Solve;Sequencing;Thought;Understand;Attention    Rehab Potential Good    OT Frequency 2x / week    OT Duration 12 weeks    OT Treatment/Interventions Self-care/ADL training;Energy conservation;Patient/family education;DME and/or AE instruction;Aquatic  Therapy;Paraffin;Passive range of motion;Balance training;Fluidtherapy;Cryotherapy;Therapist, nutritional;Therapeutic activities;Manual Therapy;Therapeutic exercise;Moist Heat;Neuromuscular education;Cognitive remediation/compensation    Plan reinforce strategies for safety with ADLs may want to ask wife to come in from lobby, , simple standing and functional reach- use gait belt, fine motor coordination left side    Consulted and Agree with Plan of Care Patient;Family member/caregiver    Family Member Consulted wife           Patient will benefit from skilled therapeutic intervention in order to improve the following deficits and impairments:   Body Structure / Function / Physical Skills: ADL,UE functional use,Balance,FMC,GMC,Coordination,Decreased knowledge of use of DME,IADL,Dexterity,Strength,Mobility Cognitive Skills: Memory,Safety Awareness,Problem Solve,Sequencing,Thought,Understand,Attention     Visit Diagnosis: Muscle weakness (generalized)  Other lack of coordination  Frontal lobe and executive function deficit  Attention and concentration deficit  Unsteadiness on feet    Problem List Patient Active Problem List   Diagnosis Date Noted  . AAA (abdominal aortic aneurysm) without rupture ([place]) 11/14/2020  . Subdural hematoma ([place]) 08/15/2020  . Iron deficiency anemia due to chronic blood loss 01/06/2019  . Orthostatic hypotension 01/05/2019    Class: Question of  . Normocytic normochromic anemia 01/05/2019  . Near syncope 01/05/2019  . Chronic respiratory failure with hypoxia ([place]) 06/30/2018  . OSA (obstructive sleep apnea) 02/11/2018  . Physical deconditioning 08/11/2017  . Shortness  of breath 10/24/2014  . COPD (chronic obstructive pulmonary disease) with emphysema ([place]) 09/01/2014  . Fatigue 10/14/2011  . Atrial fibrillation ([place]) 05/15/2011  . Premature ventricular contractions (PVCs) ([facility]) 02/25/2011  . 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  . PERSISTENT  DISORDER INITIATING/MAINTAINING SLEEP 05/10/2010  . HYPERCHOLESTEROLEMIA 05/09/2010  . GOUT 05/09/2010  . OBESITY 05/09/2010  . Obstructive sleep apnea 05/09/2010  . Essential hypertension 05/09/2010  . VENTRICULAR TACHYCARDIA 05/09/2010    Zachery Conch MOT, OTR/L  11/14/2020, 4:59 PM  [facility] [address] [place], [place], [address] Phone: [phone]   Fax:  [phone]  Name: KYLIE GROS MRN: [id number] Date of Birth: April 07, 1934

## 2020-11-14 NOTE — Patient Instructions (Signed)
Medication Instructions:  Your physician recommends that you continue on your current medications as directed. Please refer to the Current Medication list given to you today.   *If you need a refill on your cardiac medications before your next appointment, please call your pharmacy*   Lab Work: None  If you have labs (blood work) drawn today and your tests are completely normal, you will receive your results only by: Marland Kitchen MyChart Message (if you have MyChart) OR . A paper copy in the mail If you have any lab test that is abnormal or we need to change your treatment, we will call you to review the results.   Testing/Procedures: None   Follow-Up: At [facility], you and your health needs are our priority.  As part of our continuing mission to provide you with exceptional heart care, we have created designated Provider Care Teams.  These Care Teams include your primary Cardiologist (physician) and Advanced Practice Providers (APPs -  Physician Assistants and Nurse Practitioners) who all work together to provide you with the care you need, when you need it.  We recommend signing up for the patient portal called "MyChart".  Sign up information is provided on this After Visit Summary.  MyChart is used to connect with patients for Virtual Visits (Telemedicine).  Patients are able to view lab/test results, encounter notes, upcoming appointments, etc.  Non-urgent messages can be sent to your provider as well.   To learn more about what you can do with MyChart, go to [email or web address].    Your next appointment:   3 month(s)  The format for your next appointment:   In Person  Provider:   You will see one of the following Advanced Practice Providers on your designated Care Team:    Richardson Dopp, PA-C       Other Instructions Take your bp if the bp is higher than 160/95 take bp again in 15 minutes if still high 160/95 take one tablet of Hydralazine ( 25 mg)  Once.   You should  follow with GI doctor about GI issues.

## 2020-11-14 NOTE — Therapy (Signed)
[facility] [address] [place], [place], [address] Phone: [phone]   Fax:  [phone]  Physical Therapy Treatment  Patient Details  Name: [name] MRN: [id number] Date of Birth: Sep 25, 1933 Referring Provider (PT): Dohmeier, Asencion Partridge, MD (is being followed by, was referred by hospitalist)   Encounter Date: 11/14/2020   PT End of Session - 11/14/20 1809    Visit Number 6    Number of Visits 17    Date for PT Re-Evaluation 01/22/21    Authorization Type UHC Medicare    PT Start Time 4332    PT Stop Time 1615    PT Time Calculation (min) 45 min    Equipment Utilized During Treatment Gait belt    Activity Tolerance Patient tolerated treatment well    Behavior During Therapy Impulsive           Past Medical History:  Diagnosis Date  . Afib ([place])   . Arthritis   . BPH (benign prostatic hyperplasia)   . COPD (chronic obstructive pulmonary disease) ([place])   . Coronary artery disease   . Coronary artery ectasia    Mild CAD with normal systolic function per cath in August of 2011  . Depression   . Diaphragmatic paralysis    felt to be partially responsible for dyspnea  . Dyspnea    due to paralyzed diaphragm and pulmonary issues  . Dysrhythmia   . GERD (gastroesophageal reflux disease)   . Gout   . HTN (hypertension)   . Hypercholesteremia   . Hypertension   . Iron deficiency anemia   . Microhematuria   . Obesity   . OSA (obstructive sleep apnea)    CPAP  . Paroxysmal atrial fibrillation (HCC)    occured in the setting of acute E Coli sepsis and ileius 7/12  . PVC (premature ventricular contraction)    s/p PVC ablation 05/29/2010    Past Surgical History:  Procedure Laterality Date  . APPENDECTOMY  1978  . BIOPSY  02/09/2019   Procedure: BIOPSY;  Surgeon: Clarene Essex, MD;  Location: [facility];  Service: Endoscopy;;  . BIOPSY  03/02/2019   Procedure: BIOPSY;  Surgeon: Clarene Essex, MD;   Location: [facility];  Service: Endoscopy;;  . CARDIAC CATHETERIZATION  04-30-2010   Left main coronary artery is normal.   . CARDIOVASCULAR STRESS TEST  03-19-2010   0%  . CHOLECYSTECTOMY  1990  . COLONOSCOPY N/A 03/02/2019   Procedure: COLONOSCOPY;  Surgeon: Clarene Essex, MD;  [facility];  Service: Endoscopy;  Laterality: N/A;  . COLONOSCOPY W/ POLYPECTOMY    . ESOPHAGOGASTRODUODENOSCOPY (EGD) WITH PROPOFOL N/A 02/09/2019   Procedure: ESOPHAGOGASTRODUODENOSCOPY (EGD) WITH PROPOFOL;  Surgeon: Clarene Essex, MD;  [facility];  Service: Endoscopy;  Laterality: N/A;  . EYE SURGERY Right    catarct  . poly removed from nose as a child    . PVC ablation  05/29/2010  . RADIOLOGY WITH ANESTHESIA N/A 11/07/2020   Procedure: MRV HEAD WITH AND WITHOUT;  Surgeon: Radiologist, Medication, MD;  Location: [facility];  Service: Radiology;  Laterality: N/A;  . RADIOLOGY WITH ANESTHESIA N/A 11/07/2020   Procedure: CT LUMBAR SPINE WITHOUT;  Surgeon: Radiologist, Medication, MD;  Location: [facility];  Service: Radiology;  Laterality: N/A;  . US ECHOCARDIOGRAPHY  03-09-2010   EF 55-60%    There were no vitals filed for this visit.   Subjective Assessment - 11/14/20 1808    Subjective Feels he is more confident negotiating second  floor using cane, no falls to note, somewhat fatigued following cardiologist earlier    Patient is accompained by: Family member   wife, [name]   Pertinent History HTN, CAD, COPD, A-fib    Limitations Standing;Walking    How long can you sit comfortably? 30'    How long can you stand comfortably? 3'    How long can you walk comfortably? 48'    Patient Stated Goals wants to improve his balance                             [facility] Adult PT Treatment/Exercise - 11/14/20 0001      Knee/Hip Exercises: Aerobic   Nustep 8" at L2 arms 10               Balance Exercises - 11/14/20 0001      Balance Exercises: Standing   SLS with Vectors Solid  surface;Upper extremity assist 2;1 rep;30 secs;Limitations    SLS with Vectors Limitations Standing in // bars, BUE support, tapping soccerball 10x ea. LE followed by 30s manipulation of ball requiring BUE support and rest breaks    Rockerboard Anterior/posterior;Lateral;EO;30 seconds;UE support    Rockerboard Limitations 30s x2 Single UE support    Retro Gait Foam/compliant surface;Upper extremity support;5 reps;Limitations    Retro Gait Limitations encouage dpatient not to slide feet across mat emphasizing stepping    Sidestepping Foam/compliant support;5 reps;Limitations    Sidestepping Limitations 5 trips with BUE support due to fatigue               PT Short Term Goals - 11/02/20 1641      PT SHORT TERM GOAL #1   Title Pt will initiate HEP in order to indicate improved functional mobility and decreased fall risk.  ALL STGS DUE 11/21/20    Time 4    Period Weeks    Status New    Target Date 11/21/20      PT SHORT TERM GOAL #2   Title Pt will perform 5x sit <> stand in 17 seconds or less with BUE support in order to demo improved functional strength.    Baseline 19.31 seconds    Time 4    Period Weeks    Status New      PT SHORT TERM GOAL #3   Title Pt will undergo BERG with STG and LTG written as appropriate.    Baseline 10/27/20 BERG 37/56    Time 4    Period Weeks    Status Achieved      PT SHORT TERM GOAL #4   Title Pt will improve gait speed with rollator to at least 2.5 ft/sec in order demo improved community mobility.    Baseline 2.18 ft/sec    Time 4    Period Weeks    Status New      PT SHORT TERM GOAL #5   Title Pt and pt's spouse will verbalize understanding of fall prevention in the home to decr fall risk.    Time 4    Period Weeks    Status New             PT Long Term Goals - 10/27/20 1827      PT LONG TERM GOAL #1   Title Pt will be independent with final HEP in order to indicate improved functional mobility and decreased fall risk.  ALL  LTGS DUE 12/19/20    Time 8  Period Weeks    Status New      PT LONG TERM GOAL #2   Title BERG goal to be written as appropriate to determine decr fall risk.  BERG goal is 45/56    Baseline 37    Time 8    Period Weeks    Status New      PT LONG TERM GOAL #3   Title Pt will perform 5x sit <> stand in 14.5 seconds or less with BUE vs. single UE support in order to demo improved functional strength.    Baseline 19.31 seconds    Time 8    Period Weeks    Status New      PT LONG TERM GOAL #4   Title Pt will ambulate at least 150' over indoor surfaces with cane vs. LRAD with supervision in order to demo improved household mobility.    Time 8    Period Weeks    Status New      PT LONG TERM GOAL #5   Title Pt will perform TUG in 14.5 seconds or less with rollator vs. LRAD in order to demo decr fall risk.    Baseline 16.94 seconds with rollator.    Time 8    Period Weeks    Status New                 Plan - 11/14/20 1810    Clinical Impression Statement somewhat fatigued following MD visit, skilled interventions included stepping tasks to improve RLE strength and coordination to improve gait and reduce fall risk, required several rest breaks but was able to perform all requested tasks, difficulty placing RLE with tasks    Personal Factors and Comorbidities Comorbidity 3+;Past/Current Experience    Comorbidities HTN, CAD, COPD, A-fib, hx of COVID march 2021    Examination-Activity Limitations Stand;Squat;Transfers;Reach Overhead;Locomotion Level    Examination-Participation Restrictions Community Activity;Driving    Stability/Clinical Decision Making Evolving/Moderate complexity    Rehab Potential Good    PT Frequency 2x / week    PT Duration 8 weeks    PT Treatment/Interventions ADLs/Self Care Home Management;DME Instruction;Gait training;Functional mobility training;Stair training;Neuromuscular re-education;Balance training;Therapeutic exercise;Therapeutic  activities;Patient/family education;Vestibular;Energy conservation    PT Next Visit Plan continue stepping tasks focused on RLE strength, coordination and foot placement, revisit tall hurdles    Consulted and Agree with Plan of Care Patient           Patient will benefit from skilled therapeutic intervention in order to improve the following deficits and impairments:  Abnormal gait,Decreased activity tolerance,Decreased balance,Decreased endurance,Decreased coordination,Decreased cognition,Decreased knowledge of use of DME,Decreased range of motion,Difficulty walking,Decreased safety awareness,Decreased strength  Visit Diagnosis: Unsteadiness on feet  Muscle weakness (generalized)     Problem List Patient Active Problem List   Diagnosis Date Noted  . AAA (abdominal aortic aneurysm) without rupture ([place]) 11/14/2020  . Subdural hematoma ([place]) 08/15/2020  . Iron deficiency anemia due to chronic blood loss 01/06/2019  . Orthostatic hypotension 01/05/2019    Class: Question of  . Normocytic normochromic anemia 01/05/2019  . Near syncope 01/05/2019  . Chronic respiratory failure with hypoxia ([place]) 06/30/2018  . OSA (obstructive sleep apnea) 02/11/2018  . Physical deconditioning 08/11/2017  . Shortness of breath 10/24/2014  . COPD (chronic obstructive pulmonary disease) with emphysema ([place]) 09/01/2014  . Fatigue 10/14/2011  . Atrial fibrillation ([place]) 05/15/2011  . Premature ventricular contractions (PVCs) ([facility]) 02/25/2011  . 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  . PERSISTENT DISORDER INITIATING/MAINTAINING SLEEP 05/10/2010  .  HYPERCHOLESTEROLEMIA 05/09/2010  . GOUT 05/09/2010  . OBESITY 05/09/2010  . Obstructive sleep apnea 05/09/2010  . Essential hypertension 05/09/2010  . VENTRICULAR TACHYCARDIA 05/09/2010    Lanice Shirts PT 11/14/2020, 6:15 PM  [facility] [address] [place], [place], [address] Phone:  [phone]   Fax:  [phone]  Name: [name] MRN: [id number] Date of Birth: 1934-08-03

## 2020-11-16 ENCOUNTER — Encounter: Payer: Self-pay | Admitting: Occupational Therapy

## 2020-11-16 ENCOUNTER — Ambulatory Visit: Payer: Medicare Other

## 2020-11-16 ENCOUNTER — Other Ambulatory Visit: Payer: Self-pay

## 2020-11-16 ENCOUNTER — Ambulatory Visit: Payer: Medicare Other | Admitting: Occupational Therapy

## 2020-11-16 DIAGNOSIS — R2681 Unsteadiness on feet: Secondary | ICD-10-CM

## 2020-11-16 DIAGNOSIS — R41844 Frontal lobe and executive function deficit: Secondary | ICD-10-CM

## 2020-11-16 DIAGNOSIS — M6281 Muscle weakness (generalized): Secondary | ICD-10-CM

## 2020-11-16 DIAGNOSIS — R278 Other lack of coordination: Secondary | ICD-10-CM

## 2020-11-16 DIAGNOSIS — R41840 Attention and concentration deficit: Secondary | ICD-10-CM

## 2020-11-16 NOTE — Therapy (Signed)
[facility] [address] [place], [place], [address] Phone: [phone]   Fax:  [phone]  Occupational Therapy Treatment  Patient Details  Name: [name] MRN: [id number] Date of Birth: 02/14/1934 Referring Provider (OT): Dr. Brett Fairy (referred by hospitalist with send to neurology)   Encounter Date: 11/16/2020   OT End of Session - 11/16/20 1644    Visit Number 4    Number of Visits 25    Date for OT Re-Evaluation 01/24/21    Authorization Type UHC Medicare    Authorization Time Period 90 days, anticipate d/c after 8 weeks    Authorization - Visit Number 4    Authorization - Number of Visits 10    OT Start Time 6962    OT Stop Time 1615    OT Time Calculation (min) 40 min    Activity Tolerance Patient tolerated treatment well           Past Medical History:  Diagnosis Date  . Afib ([place])   . Arthritis   . BPH (benign prostatic hyperplasia)   . COPD (chronic obstructive pulmonary disease) ([place])   . Coronary artery disease   . Coronary artery ectasia    Mild CAD with normal systolic function per cath in August of 2011  . Depression   . Diaphragmatic paralysis    felt to be partially responsible for dyspnea  . Dyspnea    due to paralyzed diaphragm and pulmonary issues  . Dysrhythmia   . GERD (gastroesophageal reflux disease)   . Gout   . HTN (hypertension)   . Hypercholesteremia   . Hypertension   . Iron deficiency anemia   . Microhematuria   . Obesity   . OSA (obstructive sleep apnea)    CPAP  . Paroxysmal atrial fibrillation (H[facility])    occured in the setting of acute E Coli sepsis and ileius 7/12  . PVC (premature ventricular contraction)    s/p PVC ablation 05/29/2010    Past Surgical History:  Procedure Laterality Date  . APPENDECTOMY  1978  . BIOPSY  02/09/2019   Procedure: BIOPSY;  Surgeon: Clarene Essex, MD;  [facility];  Service: Endoscopy;;  . BIOPSY  03/02/2019    Procedure: BIOPSY;  Surgeon: Clarene Essex, MD;  [facility];  Service: Endoscopy;;  . CARDIAC CATHETERIZATION  04-30-2010   Left main coronary artery is normal.   . CARDIOVASCULAR STRESS TEST  03-19-2010   0%  . CHOLECYSTECTOMY  1990  . COLONOSCOPY N/A 03/02/2019   Procedure: COLONOSCOPY;  Surgeon: Clarene Essex, MD;  [facility];  Service: Endoscopy;  Laterality: N/A;  . COLONOSCOPY W/ POLYPECTOMY    . ESOPHAGOGASTRODUODENOSCOPY (EGD) WITH PROPOFOL N/A 02/09/2019   Procedure: ESOPHAGOGASTRODUODENOSCOPY (EGD) WITH PROPOFOL;  Surgeon: Clarene Essex, MD;  [facility];  Service: Endoscopy;  Laterality: N/A;  . EYE SURGERY Right    catarct  . poly removed from nose as a child    . PVC ablation  05/29/2010  . RADIOLOGY WITH ANESTHESIA N/A 11/07/2020   Procedure: MRV HEAD WITH AND WITHOUT;  Surgeon: Radiologist, Medication, MD;  Location: [facility];  Service: Radiology;  Laterality: N/A;  . RADIOLOGY WITH ANESTHESIA N/A 11/07/2020   Procedure: CT LUMBAR SPINE WITHOUT;  Surgeon: Radiologist, Medication, MD;  Location: [facility];  Service: Radiology;  Laterality: N/A;  . US ECHOCARDIOGRAPHY  03-09-2010   EF 55-60%    There were no vitals filed for this visit.   Subjective Assessment - 11/16/20  1602    Subjective  Pt reports his sock aide arrived    Pertinent History Pt had a fall while shopping in [employer] on 08/15/20. Pt sustained a parafalcine and tentorial SDH. Was discharged from the hospital on 08/17/20.PMH:HTN, CAD, COPD, A-fib    Patient Stated Goals improve balance and strength    Currently in Pain? No/denies            Treatment: placing grooved pegs in pegboard with LUE and removing with improved fine motor coordination, min v.c to avoid compensation.                     OT Education - 11/16/20 1616    Education Details coordination HEP activities- dealing cards, flipping cards, picking up and stacking coins, rotating golf balls in hand, Reveiwed donning  pants with reacher, using sock aide, unloading rolator in seated and recommendation for safe use of tub bench    Person(s) Educated Patient;Spouse    Methods Explanation;Demonstration;Verbal cues    Comprehension Verbalized understanding;Returned demonstration;Verbal cues required            OT Short Term Goals - 11/16/20 1536      OT SHORT TERM GOAL #1   Title Pt/ caregeiver will be I with HEP. 11/29/20    Time 4    Period Weeks    Status On-going    Target Date 11/29/20      OT SHORT TERM GOAL #2   Title Pt / wife will verbalize understanding of strategies to increase safety and I with ADLS.    Time 4    Period Weeks    Status On-going      OT SHORT TERM GOAL #3   Title Pt will perfrom transitional movments for ADLS with close supervision and no LOB.    Baseline histroy of falls    Time 4    Period Weeks    Status On-going      OT SHORT TERM GOAL #4   Title Pt will demonstrate improved fine motor coordination in his LUE as eveidenced by decreasing 9-hole peg test score by 4 secs.    Time 4    Period Weeks    Status On-going      OT SHORT TERM GOAL #5   Title Pt will sequence a simple functional or ADL task  with 90% or better accuracy .    Time 4    Period Weeks    Status On-going      OT SHORT TERM GOAL #6   Title Pt will demonstrate improved fine motor coordination for ADLs as evidenced by decreasing 3 button/ unbutton time to 90 secs or less.    Time 4    Period Weeks    Status On-going             OT Long Term Goals - 11/01/20 6314      OT LONG TERM GOAL #1   Title Pt/ wife will be I with updated HEP for proximal strength.    Time 12    Period Weeks    Status New    Target Date 01/24/21      OT LONG TERM GOAL #2   Title Pt will demonstrate ability to perform simple beverage / snack prep modifeied indpendent demonstraing good saftey awareness.    Time 12    Period Weeks    Status New      OT LONG TERM GOAL #3   Title Pt will perforrm  bathing  and  dressing mod I in a reasonable amout of time    Baseline increased time required    Time 12    Period Weeks    Status New      OT LONG TERM GOAL #4   Title Pt will demonstrate adequate bilateral UE strength to retrieve a lightweight object (3 lbs) from eye level  shelf without dropping with right and left UE's individually.    Time 12    Period Weeks    Status New      OT LONG TERM GOAL #5   Title Pt will increase standing functional reach test score to 10 inches or greater with RUE to minimize fall risk during ADLs.    Time 12    Period Weeks    Status New                 Plan - 11/16/20 1605    Clinical Impression Statement Pt is progressing towards goals.He demonstrates improving fine motor coordination and LUE functional use. He has received his sock aide.    OT Occupational Profile and History Detailed Assessment- Review of Records and additional review of physical, cognitive, psychosocial history related to current functional performance    Occupational performance deficits (Please refer to evaluation for details): ADL's;IADL's;Leisure;Social Participation    Body Structure / Function / Physical Skills ADL;UE functional use;Balance;FMC;GMC;Coordination;Decreased knowledge of use of DME;IADL;Dexterity;Strength;Mobility    Cognitive Skills Memory;Safety Awareness;Problem Solve;Sequencing;Thought;Understand;Attention    Rehab Potential Good    OT Frequency 2x / week    OT Duration 12 weeks    OT Treatment/Interventions Self-care/ADL training;Energy conservation;Patient/family education;DME and/or AE instruction;Aquatic Therapy;Paraffin;Passive range of motion;Balance training;Fluidtherapy;Cryotherapy;Therapist, nutritional;Therapeutic activities;Manual Therapy;Therapeutic exercise;Moist Heat;Neuromuscular education;Cognitive remediation/compensation    Plan dynamic standing balance, use gait belt    Consulted and Agree with Plan of Care Patient;Family  member/caregiver    Family Member Consulted wife           Patient will benefit from skilled therapeutic intervention in order to improve the following deficits and impairments:   Body Structure / Function / Physical Skills: ADL,UE functional use,Balance,FMC,GMC,Coordination,Decreased knowledge of use of DME,IADL,Dexterity,Strength,Mobility Cognitive Skills: Memory,Safety Awareness,Problem Solve,Sequencing,Thought,Understand,Attention     Visit Diagnosis: Other lack of coordination  Frontal lobe and executive function deficit  Attention and concentration deficit  Unsteadiness on feet  Muscle weakness (generalized)    Problem List Patient Active Problem List   Diagnosis Date Noted  . AAA (abdominal aortic aneurysm) without rupture ([place]) 11/14/2020  . Subdural hematoma ([place]) 08/15/2020  . Iron deficiency anemia due to chronic blood loss 01/06/2019  . Orthostatic hypotension 01/05/2019    Class: Question of  . Normocytic normochromic anemia 01/05/2019  . Near syncope 01/05/2019  . Chronic respiratory failure with hypoxia ([place]) 06/30/2018  . OSA (obstructive sleep apnea) 02/11/2018  . Physical deconditioning 08/11/2017  . Shortness of breath 10/24/2014  . COPD (chronic obstructive pulmonary disease) with emphysema ([place]) 09/01/2014  . Fatigue 10/14/2011  . Atrial fibrillation ([place]) 05/15/2011  . Premature ventricular contractions (PVCs) ([facility]) 02/25/2011  . 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  . PERSISTENT DISORDER INITIATING/MAINTAINING SLEEP 05/10/2010  . HYPERCHOLESTEROLEMIA 05/09/2010  . GOUT 05/09/2010  . OBESITY 05/09/2010  . Obstructive sleep apnea 05/09/2010  . Essential hypertension 05/09/2010  . VENTRICULAR TACHYCARDIA 05/09/2010    Olanrewaju Osborn 11/16/2020, 4:44 PM  [facility] [address] [place], [place], [address] Phone: [phone]   Fax:  [phone]  Name: [name] MRN:  [id number] Date of Birth: Mar 09, 1934

## 2020-11-17 ENCOUNTER — Telehealth: Payer: Self-pay | Admitting: *Deleted

## 2020-11-17 MED ORDER — HYDRALAZINE HCL 25 MG PO TABS: 25.0000 mg | ORAL_TABLET | ORAL | 1 refills | Status: AC | PRN

## 2020-11-17 NOTE — Telephone Encounter (Signed)
Advised with Richardson Dopp, PA pt is NOT to take Metoprolol. Pt is aware and agreeable to treatment plan.

## 2020-11-17 NOTE — Therapy (Signed)
[facility] [address] [place], [place], [address] Phone: [phone]   Fax:  [phone]  Physical Therapy Treatment  Patient Details  Name: [name] MRN: [id number] Date of Birth: 04-Feb-1934 Referring Provider (PT): Dohmeier, Asencion Partridge, MD (is being followed by, was referred by hospitalist)   Encounter Date: 11/16/2020   PT End of Session - 11/16/20 6160    Visit Number 7    Number of Visits 17    Date for PT Re-Evaluation 01/22/21    Authorization Type UHC Medicare    PT Start Time 7371    PT Stop Time 1530    PT Time Calculation (min) 45 min    Equipment Utilized During Treatment Gait belt    Activity Tolerance Patient tolerated treatment well    Behavior During Therapy Impulsive           Past Medical History:  Diagnosis Date  . Afib ([place])   . Arthritis   . BPH (benign prostatic hyperplasia)   . COPD (chronic obstructive pulmonary disease) ([place])   . Coronary artery disease   . Coronary artery ectasia    Mild CAD with normal systolic function per cath in August of 2011  . Depression   . Diaphragmatic paralysis    felt to be partially responsible for dyspnea  . Dyspnea    due to paralyzed diaphragm and pulmonary issues  . Dysrhythmia   . GERD (gastroesophageal reflux disease)   . Gout   . HTN (hypertension)   . Hypercholesteremia   . Hypertension   . Iron deficiency anemia   . Microhematuria   . Obesity   . OSA (obstructive sleep apnea)    CPAP  . Paroxysmal atrial fibrillation (HCC)    occured in the setting of acute E Coli sepsis and ileius 7/12  . PVC (premature ventricular contraction)    s/p PVC ablation 05/29/2010    Past Surgical History:  Procedure Laterality Date  . APPENDECTOMY  1978  . BIOPSY  02/09/2019   Procedure: BIOPSY;  Surgeon: Clarene Essex, MD;  Location: [facility];  Service: Endoscopy;;  . BIOPSY  03/02/2019   Procedure: BIOPSY;  Surgeon: Clarene Essex, MD;   [facility];  Service: Endoscopy;;  . CARDIAC CATHETERIZATION  04-30-2010   Left main coronary artery is normal.   . CARDIOVASCULAR STRESS TEST  03-19-2010   0%  . CHOLECYSTECTOMY  1990  . COLONOSCOPY N/A 03/02/2019   Procedure: COLONOSCOPY;  Surgeon: Clarene Essex, MD;  [facility];  Service: Endoscopy;  Laterality: N/A;  . COLONOSCOPY W/ POLYPECTOMY    . ESOPHAGOGASTRODUODENOSCOPY (EGD) WITH PROPOFOL N/A 02/09/2019   Procedure: ESOPHAGOGASTRODUODENOSCOPY (EGD) WITH PROPOFOL;  Surgeon: Clarene Essex, MD;  [facility];  Service: Endoscopy;  Laterality: N/A;  . EYE SURGERY Right    catarct  . poly removed from nose as a child    . PVC ablation  05/29/2010  . RADIOLOGY WITH ANESTHESIA N/A 11/07/2020   Procedure: MRV HEAD WITH AND WITHOUT;  Surgeon: Radiologist, Medication, MD;  Location: [facility];  Service: Radiology;  Laterality: N/A;  . RADIOLOGY WITH ANESTHESIA N/A 11/07/2020   Procedure: CT LUMBAR SPINE WITHOUT;  Surgeon: Radiologist, Medication, MD;  Location: [facility];  Service: Radiology;  Laterality: N/A;  . US ECHOCARDIOGRAPHY  03-09-2010   EF 55-60%    There were no vitals filed for this visit.   Subjective Assessment - 11/16/20 1455    Subjective No new c/o, no changes in meds,  no pain, no falls    Patient is accompained by: Family member   wife, [name]   Pertinent History HTN, CAD, COPD, A-fib    Limitations Standing;Walking    How long can you sit comfortably? 30'    How long can you stand comfortably? 59'    How long can you walk comfortably? 31'    Patient Stated Goals wants to improve his balance                             [facility] Adult PT Treatment/Exercise - 11/16/20 0001      Ambulation/Gait   Ramp 5: Supervision;4: Min assist;Other (comment)    Ramp Details (indicate cue type and reason) 1x up/down with cues to control descent      Knee/Hip Exercises: Aerobic   Nustep 8' L2 arms 10      Knee/Hip Exercises: Seated   Marching  Strengthening;Right;2 sets;10 reps;Limitations    Marching Limitations performed against yellow band      Ankle Exercises: Seated   Other Seated Ankle Exercises seated resisted DF 2x10               Balance Exercises - 11/16/20 0001      Balance Exercises: Standing   Step Ups Forward;Lateral;2 inch;UE support 2;Limitations    Step Ups Limitations performed with yellow band around knees 10x ea. position    Retro Gait 5 reps;Theraband;Limitations    Theraband Level (Retro Gait) Level 1 (Yellow)    Retro Gait Limitations performed 5 trips down counter in combination with tandem walking    Sidestepping 5 reps;Theraband;Limitations    Theraband Level (Sidestepping) Level 1 (Yellow)    Sidestepping Limitations 5 trips down counter               PT Short Term Goals - 11/02/20 1641      PT SHORT TERM GOAL #1   Title Pt will initiate HEP in order to indicate improved functional mobility and decreased fall risk.  ALL STGS DUE 11/21/20    Time 4    Period Weeks    Status New    Target Date 11/21/20      PT SHORT TERM GOAL #2   Title Pt will perform 5x sit <> stand in 17 seconds or less with BUE support in order to demo improved functional strength.    Baseline 19.31 seconds    Time 4    Period Weeks    Status New      PT SHORT TERM GOAL #3   Title Pt will undergo BERG with STG and LTG written as appropriate.    Baseline 10/27/20 BERG 37/56    Time 4    Period Weeks    Status Achieved      PT SHORT TERM GOAL #4   Title Pt will improve gait speed with rollator to at least 2.5 ft/sec in order demo improved community mobility.    Baseline 2.18 ft/sec    Time 4    Period Weeks    Status New      PT SHORT TERM GOAL #5   Title Pt and pt's spouse will verbalize understanding of fall prevention in the home to decr fall risk.    Time 4    Period Weeks    Status New             PT Long Term Goals - 10/27/20 1827      PT  LONG TERM GOAL #1   Title Pt will be  independent with final HEP in order to indicate improved functional mobility and decreased fall risk.  ALL LTGS DUE 12/19/20    Time 8    Period Weeks    Status New      PT LONG TERM GOAL #2   Title BERG goal to be written as appropriate to determine decr fall risk.  BERG goal is 45/56    Baseline 37    Time 8    Period Weeks    Status New      PT LONG TERM GOAL #3   Title Pt will perform 5x sit <> stand in 14.5 seconds or less with BUE vs. single UE support in order to demo improved functional strength.    Baseline 19.31 seconds    Time 8    Period Weeks    Status New      PT LONG TERM GOAL #4   Title Pt will ambulate at least 150' over indoor surfaces with cane vs. LRAD with supervision in order to demo improved household mobility.    Time 8    Period Weeks    Status New      PT LONG TERM GOAL #5   Title Pt will perform TUG in 14.5 seconds or less with rollator vs. LRAD in order to demo decr fall risk.    Baseline 16.94 seconds with rollator.    Time 8    Period Weeks    Status New                 Plan - 11/16/20 1648    Clinical Impression Statement skilled session focused on strengthening of RLE with focus on hip flexors and dorsiflexors, resisted gait tasks using t-band and stepping strategies    Personal Factors and Comorbidities Comorbidity 3+;Past/Current Experience    Comorbidities HTN, CAD, COPD, A-fib, hx of COVID march 2021    Examination-Activity Limitations Stand;Squat;Transfers;Reach Overhead;Locomotion Level    Examination-Participation Restrictions Community Activity;Driving    Stability/Clinical Decision Making Evolving/Moderate complexity    Rehab Potential Good    PT Frequency 2x / week    PT Duration 8 weeks    PT Treatment/Interventions ADLs/Self Care Home Management;DME Instruction;Gait training;Functional mobility training;Stair training;Neuromuscular re-education;Balance training;Therapeutic exercise;Therapeutic activities;Patient/family  education;Vestibular;Energy conservation    PT Next Visit Plan continue stepping tasks with focus on RLE flexion activities and placement strategies for R foot    Consulted and Agree with Plan of Care Patient           Patient will benefit from skilled therapeutic intervention in order to improve the following deficits and impairments:  Abnormal gait,Decreased activity tolerance,Decreased balance,Decreased endurance,Decreased coordination,Decreased cognition,Decreased knowledge of use of DME,Decreased range of motion,Difficulty walking,Decreased safety awareness,Decreased strength  Visit Diagnosis: Unsteadiness on feet  Muscle weakness (generalized)     Problem List Patient Active Problem List   Diagnosis Date Noted  . AAA (abdominal aortic aneurysm) without rupture ([place]) 11/14/2020  . Subdural hematoma ([place]) 08/15/2020  . Iron deficiency anemia due to chronic blood loss 01/06/2019  . Orthostatic hypotension 01/05/2019    Class: Question of  . Normocytic normochromic anemia 01/05/2019  . Near syncope 01/05/2019  . Chronic respiratory failure with hypoxia ([place]) 06/30/2018  . OSA (obstructive sleep apnea) 02/11/2018  . Physical deconditioning 08/11/2017  . Shortness of breath 10/24/2014  . COPD (chronic obstructive pulmonary disease) with emphysema ([place]) 09/01/2014  . Fatigue 10/14/2011  . Atrial fibrillation ([place]) 05/15/2011  .  Premature ventricular contractions (PVCs) ([facility]) 02/25/2011  . 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  . PERSISTENT DISORDER INITIATING/MAINTAINING SLEEP 05/10/2010  . HYPERCHOLESTEROLEMIA 05/09/2010  . GOUT 05/09/2010  . OBESITY 05/09/2010  . Obstructive sleep apnea 05/09/2010  . Essential hypertension 05/09/2010  . VENTRICULAR TACHYCARDIA 05/09/2010    [name] PT 11/17/2020, 7:08 AM  [facility] [address] [place], [place], [address] Phone: [phone]   Fax:   [phone]  Name: [name] MRN: [id number] Date of Birth: 1934/06/11

## 2020-11-17 NOTE — Telephone Encounter (Signed)
S/w pt is inquiring about metoprolol stated spouse picked up from pharmacy.  This is not on pt's med list and pt should not take this medication at this time. Will call back pt to see who prescribed.

## 2020-11-21 ENCOUNTER — Ambulatory Visit: Payer: Medicare Other | Admitting: Physical Therapy

## 2020-11-21 ENCOUNTER — Ambulatory Visit: Payer: Medicare Other

## 2020-11-21 ENCOUNTER — Ambulatory Visit: Payer: Medicare Other | Admitting: Occupational Therapy

## 2020-11-23 ENCOUNTER — Ambulatory Visit: Payer: Medicare Other

## 2020-11-23 ENCOUNTER — Ambulatory Visit: Payer: Medicare Other | Admitting: Occupational Therapy

## 2020-11-28 ENCOUNTER — Other Ambulatory Visit: Payer: Self-pay

## 2020-11-28 ENCOUNTER — Ambulatory Visit: Payer: Medicare Other

## 2020-11-28 ENCOUNTER — Encounter: Payer: Self-pay | Admitting: Physical Therapy

## 2020-11-28 ENCOUNTER — Ambulatory Visit: Payer: Medicare Other | Admitting: Physical Therapy

## 2020-11-28 ENCOUNTER — Ambulatory Visit: Payer: Medicare Other | Admitting: Occupational Therapy

## 2020-11-28 DIAGNOSIS — R2681 Unsteadiness on feet: Secondary | ICD-10-CM | POA: Diagnosis not present

## 2020-11-28 DIAGNOSIS — M6281 Muscle weakness (generalized): Secondary | ICD-10-CM

## 2020-11-28 DIAGNOSIS — R41844 Frontal lobe and executive function deficit: Secondary | ICD-10-CM

## 2020-11-28 DIAGNOSIS — R278 Other lack of coordination: Secondary | ICD-10-CM

## 2020-11-28 DIAGNOSIS — R41841 Cognitive communication deficit: Secondary | ICD-10-CM

## 2020-11-28 DIAGNOSIS — R41840 Attention and concentration deficit: Secondary | ICD-10-CM

## 2020-11-28 DIAGNOSIS — R2689 Other abnormalities of gait and mobility: Secondary | ICD-10-CM

## 2020-11-28 NOTE — Therapy (Addendum)
[facility] [address] [place], [place], [address] Phone: [phone]   Fax:  [phone]  Physical Therapy Treatment  Patient Details  Name: [name] MRN: [id number] Date of Birth: 1934/01/06 Referring Provider (PT): Dohmeier, Asencion Partridge, MD (is being followed by, was referred by hospitalist)   Encounter Date: 11/28/2020   PT End of Session - 11/28/20 1813    Visit Number 8    Number of Visits 17    Date for PT Re-Evaluation 01/22/21    Authorization Type UHC Medicare    PT Start Time 1446    PT Stop Time 1528    PT Time Calculation (min) 42 min    Equipment Utilized During Treatment Gait belt    Activity Tolerance Patient tolerated treatment well    Behavior During Therapy [facility] for tasks assessed/performed           Past Medical History:  Diagnosis Date  . Afib ([place])   . Arthritis   . BPH (benign prostatic hyperplasia)   . COPD (chronic obstructive pulmonary disease) ([place])   . Coronary artery disease   . Coronary artery ectasia    Mild CAD with normal systolic function per cath in [name] of 2011  . Depression   . Diaphragmatic paralysis    felt to be partially responsible for dyspnea  . Dyspnea    due to paralyzed diaphragm and pulmonary issues  . Dysrhythmia   . GERD (gastroesophageal reflux disease)   . Gout   . HTN (hypertension)   . Hypercholesteremia   . Hypertension   . Iron deficiency anemia   . Microhematuria   . Obesity   . OSA (obstructive sleep apnea)    CPAP  . Paroxysmal atrial fibrillation (H[facility])    occured in the setting of acute E Coli sepsis and ileius 7/12  . PVC (premature ventricular contraction)    s/p PVC ablation 05/29/2010    Past Surgical History:  Procedure Laterality Date  . APPENDECTOMY  1978  . BIOPSY  02/09/2019   Procedure: BIOPSY;  Surgeon: Drew Essex, MD;  [facility];  Service: Endoscopy;;  . BIOPSY  03/02/2019   Procedure: BIOPSY;  Surgeon:  Drew Essex, MD;  [facility];  Service: Endoscopy;;  . CARDIAC CATHETERIZATION  04-30-2010   Left main coronary artery is normal.   . CARDIOVASCULAR STRESS TEST  03-19-2010   0%  . CHOLECYSTECTOMY  1990  . COLONOSCOPY N/A 03/02/2019   Procedure: COLONOSCOPY;  Surgeon: Drew Essex, MD;  [facility];  Service: Endoscopy;  Laterality: N/A;  . COLONOSCOPY W/ POLYPECTOMY    . ESOPHAGOGASTRODUODENOSCOPY (EGD) WITH PROPOFOL N/A 02/09/2019   Procedure: ESOPHAGOGASTRODUODENOSCOPY (EGD) WITH PROPOFOL;  Surgeon: Drew Essex, MD;  [facility];  Service: Endoscopy;  Laterality: N/A;  . EYE SURGERY Right    catarct  . poly removed from nose as a child    . PVC ablation  05/29/2010  . RADIOLOGY WITH ANESTHESIA N/A 11/07/2020   Procedure: MRV HEAD WITH AND WITHOUT;  Surgeon: Radiologist, Medication, MD;  Location: [facility];  Service: Radiology;  Laterality: N/A;  . RADIOLOGY WITH ANESTHESIA N/A 11/07/2020   Procedure: CT LUMBAR SPINE WITHOUT;  Surgeon: Radiologist, Medication, MD;  Location: [facility];  Service: Radiology;  Laterality: N/A;  . US ECHOCARDIOGRAPHY  03-09-2010   EF 55-60%    There were [name] vitals filed for this visit.   Subjective Assessment - 11/28/20 1451    Subjective Nothing new, hasbeen using  his peddle bike at home. Is exhausted from OT.    Patient is accompained by: Family member   wife, [name]   Pertinent History HTN, CAD, COPD, A-fib    Limitations Standing;Walking    How long can you sit comfortably? 37'    How long can you stand comfortably? 8'    How long can you walk comfortably? 74'    Patient Stated Goals wants to improve his balance    Currently in Pain? Yes    Pain Score 5     Pain Location Back    Pain Orientation Lower    Pain Descriptors / Indicators Aching    Pain Type Chronic pain    Aggravating Factors  not really.    Pain Relieving Factors not really.                  Access Code: [email or web address] URL:  [email or web address] Date: 11/28/2020 Prepared by: [name]  Reviewed HEP, pt reports he has been performing every 3 days at home. Attempted seated marching and long arc quads with yellow tband resistance, however pt unable to maintain ROM and demonstrates compensatory movements with fatigue. Educated to continue without band at this time.   Exercises Seated Long Arc Quad - 2 x daily - 5 x weekly - 2 sets - 10 reps Seated March - 2 x daily - 5 x weekly - 2 sets - 10 reps Seated Heel Slide with Internal Rotation - 2 x daily - 5 x weekly - 2 sets - 15 reps Standing Toe Raises at Chair - 2 x daily - 5 x weekly - 2 sets - 15 reps Standing Heel Raise with Chair Support - 2 x daily - 5 x weekly - 2 sets - 15 reps             [facility] Adult PT Treatment/Exercise - 11/28/20 1456      Transfers   Transfers Stand to Sit;Sit to Stand    Sit to Stand 5: Supervision;With upper extremity assist    Five time sit to stand comments  10.22 seconds with BUE support from chair    Stand to Sit 5: Supervision;With upper extremity assist      Ambulation/Gait   Ambulation/Gait Yes    Ambulation/Gait Assistance 4: Min guard;4: Min assist;5: Supervision    Ambulation/Gait Assistance Details performed x2 laps with SPC with quad tip with min guard - intermittent cues for proper cane placement with RUE, x1 lap with scanning environment in different directions with intermittent min A for balance    Ambulation Distance (Feet) 115 Feet   x3   Assistive device Straight cane;Rollator   SPC with 4 prong tip   Gait Pattern Step-through pattern;Decreased arm swing - left    Ambulation Surface Level;Indoor    Gait velocity 14.41 seconds = 2.27 ft/sec with rollator, 16.4 seconds = 2 ft./sec with cane               Balance Exercises - 11/28/20 0001      Balance Exercises: Standing   Retro Gait 3 reps    Retro Gait Limitations at countertop with UE support, cues for step length     Sidestepping 3 reps    Sidestepping Limitations with fingertip/[name] UE support at countertop, cues for step length             PT Education - 11/28/20 1813    Education Details progress towards goals, reviewed HEP.    Person(s) Educated Patient  Methods Explanation;Demonstration;Verbal cues;Handout    Comprehension Verbalized understanding;Returned demonstration            PT Short Term Goals - 11/28/20 1455      PT SHORT TERM GOAL #1   Title Pt will initiate HEP in order to indicate improved functional mobility and decreased fall risk.  ALL STGS DUE 11/21/20    Baseline reviewed HEP on 11/28/20    Time 4    Period Weeks    Status Achieved    Target Date 11/21/20      PT SHORT TERM GOAL #2   Title Pt will perform 5x sit <> stand in 17 seconds or less with BUE support in order to demo improved functional strength.    Baseline 19.31 seconds, 10.97 seconds with BUE support on 11/28/20    Time 4    Period Weeks    Status Achieved      PT SHORT TERM GOAL #3   Title Pt will undergo BERG with STG and LTG written as appropriate.    Baseline 10/27/20 BERG 37/56    Time 4    Period Weeks    Status Achieved      PT SHORT TERM GOAL #4   Title Pt will improve gait speed with rollator to at least 2.5 ft/sec in order demo improved community mobility.    Baseline 2.18 ft/sec, 14.41 seconds = 2.27 ft/sec on 3/22    Time 4    Period Weeks    Status Not Met      PT SHORT TERM GOAL #5   Title Pt and pt's spouse will verbalize understanding of fall prevention in the home to decr fall risk.    Time 4    Period Weeks    Status New             PT Long Term Goals - 10/27/20 1827      PT LONG TERM GOAL #1   Title Pt will be independent with final HEP in order to indicate improved functional mobility and decreased fall risk.  ALL LTGS DUE 12/19/20    Time 8    Period Weeks    Status New      PT LONG TERM GOAL #2   Title BERG goal to be written as appropriate to determine decr  fall risk.  BERG goal is 45/56    Baseline 37    Time 8    Period Weeks    Status New      PT LONG TERM GOAL #3   Title Pt will perform 5x sit <> stand in 14.5 seconds or less with BUE vs. single UE support in order to demo improved functional strength.    Baseline 19.31 seconds    Time 8    Period Weeks    Status New      PT LONG TERM GOAL #4   Title Pt will ambulate at least 150' over indoor surfaces with cane vs. LRAD with supervision in order to demo improved household mobility.    Time 8    Period Weeks    Status New      PT LONG TERM GOAL #5   Title Pt will perform TUG in 14.5 seconds or less with rollator vs. LRAD in order to demo decr fall risk.    Baseline 16.94 seconds with rollator.    Time 8    Period Weeks    Status New  Plan - 11/29/20 0735    Clinical Impression Statement Assessed pt's STGs today. Pt met met STG #2 - improved 5x sit <> stand with BUE support from 19.31 seconds to 10.97 seconds. PT did not meet goal in regards to gait speed with rollator, pt performed in 2.27 ft/sec indicating a limited community ambulator. Remainder of session focused on reviewing proper technique and reps of exercises for HEP and standing balance at countertop. Pt out of breath at times needing rest breaks, O2 sats and HR WFL throughout - cued for pursed lip breathing. Will continue to progress towards LTGs.    Personal Factors and Comorbidities Comorbidity 3+;Past/Current Experience    Comorbidities HTN, CAD, COPD, A-fib, hx of COVID march 2021    Examination-Activity Limitations Stand;Squat;Transfers;Reach Overhead;Locomotion Level    Examination-Participation Restrictions Community Activity;Driving    Stability/Clinical Decision Making Evolving/Moderate complexity    Rehab Potential Good    PT Frequency 2x / week    PT Duration 8 weeks    PT Treatment/Interventions ADLs/Self Care Home Management;DME Instruction;Gait training;Functional mobility  training;Stair training;Neuromuscular re-education;Balance training;Therapeutic exercise;Therapeutic activities;Patient/family education;Vestibular;Energy conservation    PT Next Visit Plan continue stepping tasks with focus on RLE flexion activities and placement strategies for R foot. gait trianing with SPC with quad tip. RLE strengthening.    Consulted and Agree with Plan of Care Patient           Patient will benefit from skilled therapeutic intervention in order to improve the following deficits and impairments:  Abnormal gait,Decreased activity tolerance,Decreased balance,Decreased endurance,Decreased coordination,Decreased cognition,Decreased knowledge of use of DME,Decreased range of motion,Difficulty walking,Decreased safety awareness,Decreased strength  Visit Diagnosis: Other lack of coordination  Unsteadiness on feet  Muscle weakness (generalized)  Other abnormalities of gait and mobility     Problem List Patient Active Problem List   Diagnosis Date Noted  . AAA (abdominal aortic aneurysm) without rupture ([place]) 11/14/2020  . Subdural hematoma ([place]) 08/15/2020  . Iron deficiency anemia due to chronic blood loss 01/06/2019  . Orthostatic hypotension 01/05/2019    Class: Question of  . Normocytic normochromic anemia 01/05/2019  . Near syncope 01/05/2019  . Chronic respiratory failure with hypoxia ([place]) 06/30/2018  . OSA (obstructive sleep apnea) 02/11/2018  . Physical deconditioning 08/11/2017  . Shortness of breath 10/24/2014  . COPD (chronic obstructive pulmonary disease) with emphysema ([place]) 09/01/2014  . Fatigue 10/14/2011  . Atrial fibrillation ([place]) 05/15/2011  . Premature ventricular contractions (PVCs) ([facility]) 02/25/2011  . 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  . PERSISTENT DISORDER INITIATING/MAINTAINING SLEEP 05/10/2010  . HYPERCHOLESTEROLEMIA 05/09/2010  . GOUT 05/09/2010  . OBESITY 05/09/2010  . Obstructive sleep apnea 05/09/2010  . Essential hypertension  05/09/2010  . VENTRICULAR TACHYCARDIA 05/09/2010    Arliss Journey, PT, DPT  11/29/2020, 7:37 AM  [facility] [address] [place], [place], [address] Phone: [phone]   Fax:  [phone]  Name: [name] MRN: [id number] Date of Birth: [birth year]

## 2020-11-28 NOTE — Patient Instructions (Signed)
  Please complete the assigned speech therapy homework prior to your next session and return it to the speech therapist at your next visit.  

## 2020-11-28 NOTE — Therapy (Signed)
[facility] [address] [place], [place], [address] Phone: [phone]   Fax:  [phone]  Speech Language Pathology Treatment  Patient Details  Name: [name] MRN: [id number] Date of Birth: [birth year] Referring Provider (SLP): Margie Billet, PA-C; Dr. [name] PCP (doc)   Encounter Date: 11/28/2020   End of Session - 11/28/20 1650    Visit Number 2    Number of Visits 17    Date for SLP Re-Evaluation 01/29/21    SLP Start Time 7    SLP Stop Time  1615    SLP Time Calculation (min) 41 min    Activity Tolerance Patient tolerated treatment well           Past Medical History:  Diagnosis Date  . Afib ([place])   . Arthritis   . BPH (benign prostatic hyperplasia)   . COPD (chronic obstructive pulmonary disease) ([place])   . Coronary artery disease   . Coronary artery ectasia    Mild CAD with normal systolic function per cath in August of 2011  . Depression   . Diaphragmatic paralysis    felt to be partially responsible for dyspnea  . Dyspnea    due to paralyzed diaphragm and pulmonary issues  . Dysrhythmia   . GERD (gastroesophageal reflux disease)   . Gout   . HTN (hypertension)   . Hypercholesteremia   . Hypertension   . Iron deficiency anemia   . Microhematuria   . Obesity   . OSA (obstructive sleep apnea)    CPAP  . Paroxysmal atrial fibrillation (H[facility])    occured in the setting of acute E Coli sepsis and ileius 7/12  . PVC (premature ventricular contraction)    s/p PVC ablation 05/29/2010    Past Surgical History:  Procedure Laterality Date  . APPENDECTOMY  1978  . BIOPSY  02/09/2019   Procedure: BIOPSY;  Surgeon: Clarene Essex, MD;  Location: [facility];  Service: Endoscopy;;  . BIOPSY  03/02/2019   Procedure: BIOPSY;  Surgeon: Clarene Essex, MD;  Location: [facility];  Service: Endoscopy;;  . CARDIAC CATHETERIZATION  04-30-2010   Left main coronary artery is normal.   .  CARDIOVASCULAR STRESS TEST  03-19-2010   0%  . CHOLECYSTECTOMY  1990  . COLONOSCOPY N/A 03/02/2019   Procedure: COLONOSCOPY;  Surgeon: Clarene Essex, MD;  [facility];  Service: Endoscopy;  Laterality: N/A;  . COLONOSCOPY W/ POLYPECTOMY    . ESOPHAGOGASTRODUODENOSCOPY (EGD) WITH PROPOFOL N/A 02/09/2019   Procedure: ESOPHAGOGASTRODUODENOSCOPY (EGD) WITH PROPOFOL;  Surgeon: Clarene Essex, MD;  [facility];  Service: Endoscopy;  Laterality: N/A;  . EYE SURGERY Right    catarct  . poly removed from nose as a child    . PVC ablation  05/29/2010  . RADIOLOGY WITH ANESTHESIA N/A 11/07/2020   Procedure: MRV HEAD WITH AND WITHOUT;  Surgeon: Radiologist, Medication, MD;  Location: [facility];  Service: Radiology;  Laterality: N/A;  . RADIOLOGY WITH ANESTHESIA N/A 11/07/2020   Procedure: CT LUMBAR SPINE WITHOUT;  Surgeon: Radiologist, Medication, MD;  Location: [facility];  Service: Radiology;  Laterality: N/A;  . US ECHOCARDIOGRAPHY  03-09-2010   EF 55-60%    There were no vitals filed for this visit.   Subjective Assessment - 11/28/20 1536    Subjective "I came from Occupational Th - oh no it wasn't. Physical therapy." (pt correct)    Currently in Pain? Yes    Pain Score 5     Pain  Location Back    Pain Orientation Lower    Pain Descriptors / Indicators Aching    Pain Type Chronic pain                 ADULT SLP TREATMENT - 11/28/20 1537      General Information   Behavior/Cognition Alert;Cooperative;Pleasant mood      Treatment Provided   Treatment provided Cognitive-Linquistic      Cognitive-Linquistic Treatment   Treatment focused on Cognition    Skilled Treatment SLP reviewed pt's goals, and deficits of attention. SLP used auditory stimuli to work with pt on simple time math problems. Pt without diffiuclty with :00 and :30 stimuli but req'd extra time and mod-max A with all other stimuli. SLP highlighted pt's difficulty with these exercises. Pt stated at end of session, "I  liked working with you today." and thanked SLP. SLP provided homework whichpt's wife should read to pt (simple yes/no re: money combinations).      Assessment / Recommendations / Plan   Plan Continue with current plan of care      Progression Toward Goals   Progression toward goals Progressing toward goals            SLP Education - 11/28/20 1649    Education Details deficit areas - specifically attention    Person(s) Educated Patient    Methods Explanation;Demonstration    Comprehension Verbalized understanding;Need further instruction            SLP Short Term Goals - 11/28/20 1651      SLP SHORT TERM GOAL #1   Title pt will perform functional reading and/or written tasks for 7 minutes in 2 sessions    Time 4    Period Weeks    Status On-going      SLP SHORT TERM GOAL #2   Title pt will process verbal or written directions with 80% accuracy with occasional min A    Time 4    Period Weeks    Status On-going      SLP SHORT TERM GOAL #3   Title pt will verbally state 3 strategies to improve memory skills in 3 sessions    Time 4    Period Weeks    Status On-going      SLP SHORT TERM GOAL #4   Title pt will demo understanding of his non-physical deficits folloiwng brain bleed with modified independence in 3 sessions    Time 4    Period Weeks    Status On-going            SLP Long Term Goals - 11/28/20 1651      SLP LONG TERM GOAL #1   Title Pt will use memory compensation system for daily tasks when appropriate, x1 in 4 sessions    Time 8    Period Weeks   or 17 visits, for all LTGs   Status On-going      SLP LONG TERM GOAL #2   Title pt will demonstrate error awareness in linguistic tasks 90% of the time with nonverbal cues in 3 sessions    Time 8    Period Weeks    Status On-going      SLP LONG TERM GOAL #3   Title pt and/or wife will report pt has completed tasks demonstrating ability to solve problems in the home environment x3 sessions    Time 8     Period Weeks    Status On-going  SLP LONG TERM GOAL #4   Title pt will show knowledge of compensating for cognitive deficits in linguistic tasks    Time 8    Period Weeks    Status On-going            Plan - 11/28/20 1650    Clinical Impression Statement [name] presents today with cognitive communication deficits in areas of attention, problem solving, processing, awareness, and memory.Presence of aphasia still cannot be completely ruled out at this time - and will be monitored. Aphasia eval to take place if clinically indicated. SLP believes pt will benefit from skilled ST tageting these cognitive deficits and assist pt to regain his PLOF.    Speech Therapy Frequency 2x / week    Duration 8 weeks   or 17 total sessions   Treatment/Interventions Language facilitation;Compensatory techniques;Internal/external aids;SLP instruction and feedback;Patient/family education;Environmental controls;Cognitive reorganization    Potential to Achieve Goals Good    Consulted and Agree with Plan of Care Patient;Family member/caregiver           Patient will benefit from skilled therapeutic intervention in order to improve the following deficits and impairments:   Cognitive communication deficit    Problem List Patient Active Problem List   Diagnosis Date Noted  . AAA (abdominal aortic aneurysm) without rupture ([place]) 11/14/2020  . Subdural hematoma ([place]) 08/15/2020  . Iron deficiency anemia due to chronic blood loss 01/06/2019  . Orthostatic hypotension 01/05/2019    Class: Question of  . Normocytic normochromic anemia 01/05/2019  . Near syncope 01/05/2019  . Chronic respiratory failure with hypoxia ([place]) 06/30/2018  . OSA (obstructive sleep apnea) 02/11/2018  . Physical deconditioning 08/11/2017  . Shortness of breath 10/24/2014  . COPD (chronic obstructive pulmonary disease) with emphysema ([place]) 09/01/2014  . Fatigue 10/14/2011  . Atrial fibrillation ([place])  05/15/2011  . Premature ventricular contractions (PVCs) ([facility]) 02/25/2011  . 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  . PERSISTENT DISORDER INITIATING/MAINTAINING SLEEP 05/10/2010  . HYPERCHOLESTEROLEMIA 05/09/2010  . GOUT 05/09/2010  . OBESITY 05/09/2010  . Obstructive sleep apnea 05/09/2010  . Essential hypertension 05/09/2010  . VENTRICULAR TACHYCARDIA 05/09/2010    [facility] ,MS, CCC-SLP  11/28/2020, 4:52 PM  [facility] [address] [place], [place], [address] Phone: [phone]   Fax:  [phone]   Name: [name] MRN: [id number] Date of Birth: 1934-05-08

## 2020-11-28 NOTE — Therapy (Signed)
[facility] [address] [place], [place], [address] Phone: [phone]   Fax:  [phone]  Occupational Therapy Treatment  Patient Details  Name: [name] MRN: [id number] Date of Birth: November 11, 1933 Referring Provider (OT): Dr. Brett Fairy (referred by hospitalist with send to neurology)   Encounter Date: 11/28/2020   OT End of Session - 11/28/20 1443    Visit Number 5    Number of Visits 25    Date for OT Re-Evaluation 01/24/21    Authorization Type [employer] Medicare    Authorization Time Period 90 days, anticipate d/c after 8 weeks    Authorization - Visit Number 5    Authorization - Number of Visits 10    OT Start Time 1413    OT Stop Time 1445    OT Time Calculation (min) 32 min           Past Medical History:  Diagnosis Date  . Afib ([place])   . Arthritis   . BPH (benign prostatic hyperplasia)   . COPD (chronic obstructive pulmonary disease) ([place])   . Coronary artery disease   . Coronary artery ectasia    Mild CAD with normal systolic function per cath in August of 2011  . Depression   . Diaphragmatic paralysis    felt to be partially responsible for dyspnea  . Dyspnea    due to paralyzed diaphragm and pulmonary issues  . Dysrhythmia   . GERD (gastroesophageal reflux disease)   . Gout   . HTN (hypertension)   . Hypercholesteremia   . Hypertension   . Iron deficiency anemia   . Microhematuria   . Obesity   . OSA (obstructive sleep apnea)    CPAP  . Paroxysmal atrial fibrillation ([facility])    occured in the setting of acute E Coli sepsis and ileius 7/12  . PVC (premature ventricular contraction)    s/p PVC ablation 05/29/2010    Past Surgical History:  Procedure Laterality Date  . APPENDECTOMY  1978  . BIOPSY  02/09/2019   Procedure: BIOPSY;  Surgeon: Clarene Essex, MD;  [facility];  Service: Endoscopy;;  . BIOPSY  03/02/2019   Procedure: BIOPSY;  Surgeon: Clarene Essex, MD;  [facility];  Service: Endoscopy;;  . CARDIAC CATHETERIZATION  04-30-2010   Left main coronary artery is normal.   . CARDIOVASCULAR STRESS TEST  03-19-2010   0%  . CHOLECYSTECTOMY  1990  . COLONOSCOPY N/A 03/02/2019   Procedure: COLONOSCOPY;  Surgeon: Clarene Essex, MD;  [facility];  Service: Endoscopy;  Laterality: N/A;  . COLONOSCOPY W/ POLYPECTOMY    . ESOPHAGOGASTRODUODENOSCOPY (EGD) WITH PROPOFOL N/A 02/09/2019   Procedure: ESOPHAGOGASTRODUODENOSCOPY (EGD) WITH PROPOFOL;  Surgeon: Clarene Essex, MD;  [facility];  Service: Endoscopy;  Laterality: N/A;  . EYE SURGERY Right    catarct  . poly removed from nose as a child    . PVC ablation  05/29/2010  . RADIOLOGY WITH ANESTHESIA N/A 11/07/2020   Procedure: MRV HEAD WITH AND WITHOUT;  Surgeon: Radiologist, Medication, MD;  Location: [facility];  Service: Radiology;  Laterality: N/A;  . RADIOLOGY WITH ANESTHESIA N/A 11/07/2020   Procedure: CT LUMBAR SPINE WITHOUT;  Surgeon: Radiologist, Medication, MD;  Location: [facility];  Service: Radiology;  Laterality: N/A;  . US ECHOCARDIOGRAPHY  03-09-2010   EF 55-60%    There were no vitals filed for this visit.   Subjective Assessment - 11/28/20 1413    Subjective  Pt reports his  sock aide arrived    Pertinent History Pt had a fall while shopping in [employer] on 08/15/20. Pt sustained a parafalcine and tentorial SDH. Was discharged from the hospital on 08/17/20.PMH:HTN, CAD, COPD, A-fib    Patient Stated Goals improve balance and strength    Currently in Pain? No/denies                  Treatment: Functional standing to copy small peg design with LUE, for cognitive component and fine motor coordination, min difficulty/ v.c No LOB however close supervision provided for safety. Pt stood x 23 mins with 1 rest break Arm bike level 1 x 5 mins for conditioning. Pt was fatigued at end of session.                OT Short Term Goals - 11/28/20 1454      OT SHORT TERM GOAL #1    Title Pt/ caregeiver will be I with HEP. 11/29/20    Time 4    Period Weeks    Status On-going    [place] Date 11/29/20      OT SHORT TERM GOAL #2   Title Pt / wife will verbalize understanding of strategies to increase safety and I with ADLS.    Time 4    Period Weeks    Status Achieved      OT SHORT TERM GOAL #3   Title Pt will perfrom transitional movments for ADLS with close supervision and no LOB.    Baseline histroy of falls    Time 4    Period Weeks    Status On-going      OT SHORT TERM GOAL #4   Title Pt will demonstrate improved fine motor coordination in his LUE as eveidenced by decreasing 9-hole peg test score by 4 secs.    Time 4    Period Weeks    Status On-going      OT SHORT TERM GOAL #5   Title Pt will sequence a simple functional or ADL task  with 90% or better accuracy .    Time 4    Period Weeks    Status On-going      OT SHORT TERM GOAL #6   Title Pt will demonstrate improved fine motor coordination for ADLs as evidenced by decreasing 3 button/ unbutton time to 90 secs or less.    Time 4    Period Weeks    Status On-going             OT Long Term Goals - 11/01/20 9449      OT LONG TERM GOAL #1   Title Pt/ wife will be I with updated HEP for proximal strength.    Time 12    Period Weeks    Status New    [place] Date 01/24/21      OT LONG TERM GOAL #2   Title Pt will demonstrate ability to perform simple beverage / snack prep modifeied indpendent demonstraing good saftey awareness.    Time 12    Period Weeks    Status New      OT LONG TERM GOAL #3   Title Pt will perforrm bathing and  dressing mod I in a reasonable amout of time    Baseline increased time required    Time 12    Period Weeks    Status New      OT LONG TERM GOAL #4   Title Pt will demonstrate adequate bilateral UE strength  to retrieve a lightweight object (3 lbs) from eye level  shelf without dropping with right and left UE's individually.    Time 12    Period Weeks     Status New      OT LONG TERM GOAL #5   Title Pt will increase standing functional reach test score to 10 inches or greater with RUE to minimize fall risk during ADLs.    Time 12    Period Weeks    Status New                 Plan - 11/28/20 1443    Clinical Impression Statement Pt is progressing towards goals.He demonstrates standing balance and fine motor coordination. Pt reports he has used his sock aide at home.    OT Occupational Profile and History Detailed Assessment- Review of Records and additional review of physical, cognitive, psychosocial history related to current functional performance    Occupational performance deficits (Please refer to evaluation for details): ADL's;IADL's;Leisure;Social Participation    Body Structure / Function / Physical Skills ADL;UE functional use;Balance;FMC;GMC;Coordination;Decreased knowledge of use of DME;IADL;Dexterity;Strength;Mobility    Cognitive Skills Memory;Safety Awareness;Problem Solve;Sequencing;Thought;Understand;Attention    OT Frequency 2x / week    OT Duration 12 weeks    OT Treatment/Interventions Self-care/ADL training;Energy conservation;Patient/family education;DME and/or AE instruction;Aquatic Therapy;Paraffin;Passive range of motion;Balance training;Fluidtherapy;Cryotherapy;Therapist, nutritional;Therapeutic activities;Manual Therapy;Therapeutic exercise;Moist Heat;Neuromuscular education;Cognitive remediation/compensation    Plan start checking short term goals, safety for ADLs, dynamic standing balance wth gait belt           Patient will benefit from skilled therapeutic intervention in order to improve the following deficits and impairments:   Body Structure / Function / Physical Skills: ADL,UE functional use,Balance,FMC,GMC,Coordination,Decreased knowledge of use of DME,IADL,Dexterity,Strength,Mobility Cognitive Skills: Memory,Safety Awareness,Problem Solve,Sequencing,Thought,Understand,Attention     Visit  Diagnosis: Other lack of coordination  Frontal lobe and executive function deficit  Attention and concentration deficit  Unsteadiness on feet  Muscle weakness (generalized)    Problem List Patient Active Problem List   Diagnosis Date Noted  . AAA (abdominal aortic aneurysm) without rupture ([place]) 11/14/2020  . Subdural hematoma ([place]) 08/15/2020  . Iron deficiency anemia due to chronic blood loss 01/06/2019  . Orthostatic hypotension 01/05/2019    Class: Question of  . Normocytic normochromic anemia 01/05/2019  . Near syncope 01/05/2019  . Chronic respiratory failure with hypoxia ([place]) 06/30/2018  . OSA (obstructive sleep apnea) 02/11/2018  . Physical deconditioning 08/11/2017  . Shortness of breath 10/24/2014  . COPD (chronic obstructive pulmonary disease) with emphysema ([place]) 09/01/2014  . Fatigue 10/14/2011  . Atrial fibrillation ([place]) 05/15/2011  . Premature ventricular contractions (PVCs) ([facility]) 02/25/2011  . 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  . PERSISTENT DISORDER INITIATING/MAINTAINING SLEEP 05/10/2010  . HYPERCHOLESTEROLEMIA 05/09/2010  . GOUT 05/09/2010  . OBESITY 05/09/2010  . Obstructive sleep apnea 05/09/2010  . Essential hypertension 05/09/2010  . VENTRICULAR TACHYCARDIA 05/09/2010    RINE,KATHRYN 11/28/2020, 3:09 PM  [facility] [address] [place], [place], [address] Phone: [phone]   Fax:  [phone]  Name: [name] MRN: [id number] Date of Birth: Jan 08, 1934

## 2020-11-30 ENCOUNTER — Ambulatory Visit: Payer: Medicare Other

## 2020-11-30 ENCOUNTER — Other Ambulatory Visit: Payer: Self-pay

## 2020-11-30 ENCOUNTER — Ambulatory Visit: Payer: Medicare Other | Admitting: Occupational Therapy

## 2020-11-30 ENCOUNTER — Encounter: Payer: Self-pay | Admitting: Occupational Therapy

## 2020-11-30 DIAGNOSIS — R41841 Cognitive communication deficit: Secondary | ICD-10-CM

## 2020-11-30 DIAGNOSIS — R2681 Unsteadiness on feet: Secondary | ICD-10-CM

## 2020-11-30 DIAGNOSIS — R278 Other lack of coordination: Secondary | ICD-10-CM

## 2020-11-30 DIAGNOSIS — R41844 Frontal lobe and executive function deficit: Secondary | ICD-10-CM

## 2020-11-30 DIAGNOSIS — R41840 Attention and concentration deficit: Secondary | ICD-10-CM

## 2020-11-30 DIAGNOSIS — M6281 Muscle weakness (generalized): Secondary | ICD-10-CM

## 2020-11-30 NOTE — Patient Instructions (Signed)
  Please complete the assigned speech therapy homework prior to your next session and return it to the speech therapist at your next visit.  

## 2020-11-30 NOTE — Therapy (Signed)
[facility] [address] [place], [place], [address] Phone: [phone]   Fax:  [phone]  Physical Therapy Treatment  Patient Details  Name: [name] MRN: [id number] Date of Birth: [birth year] Referring Provider (PT): Dohmeier, Asencion Partridge, MD (is being followed by, was referred by hospitalist)   Encounter Date: 11/30/2020   PT End of Session - 11/30/20 1627    Visit Number 9    Number of Visits 17    Date for PT Re-Evaluation 01/22/21    Authorization Type UHC Medicare    PT Start Time 9983    PT Stop Time 1700    PT Time Calculation (min) 45 min           Past Medical History:  Diagnosis Date  . Afib ([place])   . Arthritis   . BPH (benign prostatic hyperplasia)   . COPD (chronic obstructive pulmonary disease) ([place])   . Coronary artery disease   . Coronary artery ectasia    Mild CAD with normal systolic function per cath in August of 2011  . Depression   . Diaphragmatic paralysis    felt to be partially responsible for dyspnea  . Dyspnea    due to paralyzed diaphragm and pulmonary issues  . Dysrhythmia   . GERD (gastroesophageal reflux disease)   . Gout   . HTN (hypertension)   . Hypercholesteremia   . Hypertension   . Iron deficiency anemia   . Microhematuria   . Obesity   . OSA (obstructive sleep apnea)    CPAP  . Paroxysmal atrial fibrillation (HCC)    occured in the setting of acute E Coli sepsis and ileius 7/12  . PVC (premature ventricular contraction)    s/p PVC ablation 05/29/2010    Past Surgical History:  Procedure Laterality Date  . APPENDECTOMY  1978  . BIOPSY  02/09/2019   Procedure: BIOPSY;  Surgeon: Clarene Essex, MD;  Location: [facility];  Service: Endoscopy;;  . BIOPSY  03/02/2019   Procedure: BIOPSY;  Surgeon: Clarene Essex, MD;  Location: [facility];  Service: Endoscopy;;  . CARDIAC CATHETERIZATION  04-30-2010   Left main coronary artery is normal.   . CARDIOVASCULAR STRESS  TEST  03-19-2010   0%  . CHOLECYSTECTOMY  1990  . COLONOSCOPY N/A 03/02/2019   Procedure: COLONOSCOPY;  Surgeon: Clarene Essex, MD;  [facility];  Service: Endoscopy;  Laterality: N/A;  . COLONOSCOPY W/ POLYPECTOMY    . ESOPHAGOGASTRODUODENOSCOPY (EGD) WITH PROPOFOL N/A 02/09/2019   Procedure: ESOPHAGOGASTRODUODENOSCOPY (EGD) WITH PROPOFOL;  Surgeon: Clarene Essex, MD;  [facility];  Service: Endoscopy;  Laterality: N/A;  . EYE SURGERY Right    catarct  . poly removed from nose as a child    . PVC ablation  05/29/2010  . RADIOLOGY WITH ANESTHESIA N/A 11/07/2020   Procedure: MRV HEAD WITH AND WITHOUT;  Surgeon: Radiologist, Medication, MD;  Location: [facility];  Service: Radiology;  Laterality: N/A;  . RADIOLOGY WITH ANESTHESIA N/A 11/07/2020   Procedure: CT LUMBAR SPINE WITHOUT;  Surgeon: Radiologist, Medication, MD;  Location: [facility];  Service: Radiology;  Laterality: N/A;  . US ECHOCARDIOGRAPHY  03-09-2010   EF 55-60%    There were no vitals filed for this visit.                      [facility] Adult PT Treatment/Exercise - 11/30/20 0001      Knee/Hip Exercises: Aerobic   Nustep 8' L2 arms 10  Knee/Hip Exercises: Seated   Other Seated Knee/Hip Exercises seated rolling ball with R foot 30s x3               Balance Exercises - 11/30/20 0001      Balance Exercises: Standing   Stepping Strategy Anterior;Posterior;Lateral;10 reps;Limitations    Stepping Strategy Limitations in sitting, placing R foot onto 2" block 10x in all 3 diections    Tandem Gait Forward;Upper extremity support;5 reps    Tandem Gait Limitations against red band    Retro Gait 5 reps;Upper extremity support;Theraband;Limitations    Theraband Level (Retro Gait) Level 2 (Red)    Retro Gait Limitations at counter    Sidestepping Upper extremity support;5 reps;Theraband;Limitations    Theraband Level (Sidestepping) Level 2 (Red)    Sidestepping Limitations at counter    Step Over  Hurdles / Cones ambulation of 36f x4 stepping over (4) 2" obstacles using Cane and CGA to stabilize when leading with RLE               PT Short Term Goals - 11/28/20 1455      PT SHORT TERM GOAL #1   Title Pt will initiate HEP in order to indicate improved functional mobility and decreased fall risk.  ALL STGS DUE 11/21/20    Baseline reviewed HEP on 11/28/20    Time 4    Period Weeks    Status Achieved    Target Date 11/21/20      PT SHORT TERM GOAL #2   Title Pt will perform 5x sit <> stand in 17 seconds or less with BUE support in order to demo improved functional strength.    Baseline 19.31 seconds, 10.97 seconds with BUE support on 11/28/20    Time 4    Period Weeks    Status Achieved      PT SHORT TERM GOAL #3   Title Pt will undergo BERG with STG and LTG written as appropriate.    Baseline 10/27/20 BERG 37/56    Time 4    Period Weeks    Status Achieved      PT SHORT TERM GOAL #4   Title Pt will improve gait speed with rollator to at least 2.5 ft/sec in order demo improved community mobility.    Baseline 2.18 ft/sec, 14.41 seconds = 2.27 ft/sec on 3/22    Time 4    Period Weeks    Status Not Met      PT SHORT TERM GOAL #5   Title Pt and pt's spouse will verbalize understanding of fall prevention in the home to decr fall risk.    Time 4    Period Weeks    Status New             PT Long Term Goals - 10/27/20 1827      PT LONG TERM GOAL #1   Title Pt will be independent with final HEP in order to indicate improved functional mobility and decreased fall risk.  ALL LTGS DUE 12/19/20    Time 8    Period Weeks    Status New      PT LONG TERM GOAL #2   Title BERG goal to be written as appropriate to determine decr fall risk.  BERG goal is 45/56    Baseline 37    Time 8    Period Weeks    Status New      PT LONG TERM GOAL #3   Title Pt will perform 5x  sit <> stand in 14.5 seconds or less with BUE vs. single UE support in order to demo improved functional  strength.    Baseline 19.31 seconds    Time 8    Period Weeks    Status New      PT LONG TERM GOAL #4   Title Pt will ambulate at least 150' over indoor surfaces with cane vs. LRAD with supervision in order to demo improved household mobility.    Time 8    Period Weeks    Status New      PT LONG TERM GOAL #5   Title Pt will perform TUG in 14.5 seconds or less with rollator vs. LRAD in order to demo decr fall risk.    Baseline 16.94 seconds with rollator.    Time 8    Period Weeks    Status New                 Plan - 11/30/20 1830    Clinical Impression Statement Continued strength and balance training emphasizing R hip flexion strategies showing improved ability to place R foot onto 2" block in sitting, able to tolerate increased reps with no distress but rest breaks needed due to fatigue vs. SOB, cadence increasing with ambulation, able to negotiate small obstacles with improved stability, requires concentration to step over leading with R foot, no problems when leading L, able to complete tasks with cane    Personal Factors and Comorbidities Comorbidity 3+;Past/Current Experience    Comorbidities HTN, CAD, COPD, A-fib, hx of COVID march 2021    Examination-Activity Limitations Stand;Squat;Transfers;Reach Overhead;Locomotion Level    Examination-Participation Restrictions Community Activity;Driving    Stability/Clinical Decision Making Evolving/Moderate complexity    Rehab Potential Good    PT Frequency 2x / week    PT Duration 8 weeks    PT Treatment/Interventions ADLs/Self Care Home Management;DME Instruction;Gait training;Functional mobility training;Stair training;Neuromuscular re-education;Balance training;Therapeutic exercise;Therapeutic activities;Patient/family education;Vestibular;Energy conservation    PT Next Visit Plan continue stepping tasks with focus on RLE flexion activities and placement strategies for R foot, revisit small hurdles, 10th visit note     Consulted and Agree with Plan of Care Patient           Patient will benefit from skilled therapeutic intervention in order to improve the following deficits and impairments:  Abnormal gait,Decreased activity tolerance,Decreased balance,Decreased endurance,Decreased coordination,Decreased cognition,Decreased knowledge of use of DME,Decreased range of motion,Difficulty walking,Decreased safety awareness,Decreased strength  Visit Diagnosis: Unsteadiness on feet  Muscle weakness (generalized)     Problem List Patient Active Problem List   Diagnosis Date Noted  . AAA (abdominal aortic aneurysm) without rupture ([place]) 11/14/2020  . Subdural hematoma ([place]) 08/15/2020  . Iron deficiency anemia due to chronic blood loss 01/06/2019  . Orthostatic hypotension 01/05/2019    Class: Question of  . Normocytic normochromic anemia 01/05/2019  . Near syncope 01/05/2019  . Chronic respiratory failure with hypoxia ([place]) 06/30/2018  . OSA (obstructive sleep apnea) 02/11/2018  . Physical deconditioning 08/11/2017  . Shortness of breath 10/24/2014  . COPD (chronic obstructive pulmonary disease) with emphysema ([place]) 09/01/2014  . Fatigue 10/14/2011  . Atrial fibrillation ([place]) 05/15/2011  . Premature ventricular contractions (PVCs) ([facility]) 02/25/2011  . 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  . PERSISTENT DISORDER INITIATING/MAINTAINING SLEEP 05/10/2010  . HYPERCHOLESTEROLEMIA 05/09/2010  . GOUT 05/09/2010  . OBESITY 05/09/2010  . Obstructive sleep apnea 05/09/2010  . Essential hypertension 05/09/2010  . VENTRICULAR TACHYCARDIA 05/09/2010    Lanice Shirts PT 11/30/2020, 6:44  PM  [facility] [address] [place], [place], [address] Phone: [phone]   Fax:  [phone]  Name: [name] MRN: [id number] Date of Birth: 13-Oct-1933

## 2020-11-30 NOTE — Therapy (Signed)
[facility] [address] [place], [place], [address] Phone: [phone]   Fax:  [phone]  Occupational Therapy Treatment  Patient Details  Name: [name] MRN: [id number] Date of Birth: 1934-04-11 Referring Provider (OT): Dr. Brett Fairy (referred by hospitalist with send to neurology)   Encounter Date: 11/30/2020   OT End of Session - 11/30/20 1443    Visit Number 6    Number of Visits 25    Date for OT Re-Evaluation 01/24/21    Authorization Type UHC Medicare    Authorization Time Period 90 days, anticipate d/c after 8 weeks    Authorization - Visit Number 6    Authorization - Number of Visits 10    OT Start Time 3419    OT Stop Time 1526    OT Time Calculation (min) 43 min           Past Medical History:  Diagnosis Date  . Afib ([place])   . Arthritis   . BPH (benign prostatic hyperplasia)   . COPD (chronic obstructive pulmonary disease) ([place])   . Coronary artery disease   . Coronary artery ectasia    Mild CAD with normal systolic function per cath in August of 2011  . Depression   . Diaphragmatic paralysis    felt to be partially responsible for dyspnea  . Dyspnea    due to paralyzed diaphragm and pulmonary issues  . Dysrhythmia   . GERD (gastroesophageal reflux disease)   . Gout   . HTN (hypertension)   . Hypercholesteremia   . Hypertension   . Iron deficiency anemia   . Microhematuria   . Obesity   . OSA (obstructive sleep apnea)    CPAP  . Paroxysmal atrial fibrillation ([facility])    occured in the setting of acute E Coli sepsis and ileius 7/12  . PVC (premature ventricular contraction)    s/p PVC ablation 05/29/2010    Past Surgical History:  Procedure Laterality Date  . APPENDECTOMY  1978  . BIOPSY  02/09/2019   Procedure: BIOPSY;  Surgeon: Clarene Essex, MD;  [facility];  Service: Endoscopy;;  . BIOPSY  03/02/2019   Procedure: BIOPSY;  Surgeon: Clarene Essex, MD;  [facility];  Service: Endoscopy;;  . CARDIAC CATHETERIZATION  04-30-2010   Left main coronary artery is normal.   . CARDIOVASCULAR STRESS TEST  03-19-2010   0%  . CHOLECYSTECTOMY  1990  . COLONOSCOPY N/A 03/02/2019   Procedure: COLONOSCOPY;  Surgeon: Clarene Essex, MD;  [facility];  Service: Endoscopy;  Laterality: N/A;  . COLONOSCOPY W/ POLYPECTOMY    . ESOPHAGOGASTRODUODENOSCOPY (EGD) WITH PROPOFOL N/A 02/09/2019   Procedure: ESOPHAGOGASTRODUODENOSCOPY (EGD) WITH PROPOFOL;  Surgeon: Clarene Essex, MD;  [facility];  Service: Endoscopy;  Laterality: N/A;  . EYE SURGERY Right    catarct  . poly removed from nose as a child    . PVC ablation  05/29/2010  . RADIOLOGY WITH ANESTHESIA N/A 11/07/2020   Procedure: MRV HEAD WITH AND WITHOUT;  Surgeon: Radiologist, Medication, MD;  Location: [facility];  Service: Radiology;  Laterality: N/A;  . RADIOLOGY WITH ANESTHESIA N/A 11/07/2020   Procedure: CT LUMBAR SPINE WITHOUT;  Surgeon: Radiologist, Medication, MD;  Location: [facility];  Service: Radiology;  Laterality: N/A;  . US ECHOCARDIOGRAPHY  03-09-2010   EF 55-60%    There were no vitals filed for this visit.   Subjective Assessment - 11/30/20 1442    Subjective  Pt reports "nothing  is getting better yet"  "I think it's that my brain doesn't work good"    Pertinent History Pt had a fall while shopping in [employer] on 08/15/20. Pt sustained a parafalcine and tentorial SDH. Was discharged from the hospital on 08/17/20.PMH:HTN, CAD, COPD, A-fib    Patient Stated Goals improve balance and strength    Currently in Pain? No/denies                TREATMENT:  9 Hole: eval 45.72s with LUE and this session completed 44.56s with LUE.   PVC pipe tree: separated with min verbal cues. Followed Fig 8 with min assistance with verbal/visual cues for identifying differences in some pieces. Pt self-corrected errors with pieces facing wrong direction. Fig 11 completed with increased time and mod verbal and  visual cues.   Constant Therapy: Alternating Symbols Level 3 with 96% accuracy and 72.02s response time. Pt required increased time and min vc for scanning for all items.                  OT Short Term Goals - 11/30/20 1443      OT SHORT TERM GOAL #1   Title Pt/ caregeiver will be I with HEP. 11/29/20    Time 4    Period Weeks    Status On-going    Target Date 11/29/20      OT SHORT TERM GOAL #2   Title Pt / wife will verbalize understanding of strategies to increase safety and I with ADLS.    Time 4    Period Weeks    Status Achieved      OT SHORT TERM GOAL #3   Title Pt will perfrom transitional movments for ADLS with close supervision and no LOB.    Baseline histroy of falls    Time 4    Period Weeks    Status On-going      OT SHORT TERM GOAL #4   Title Pt will demonstrate improved fine motor coordination in his LUE as eveidenced by decreasing 9-hole peg test score by 4 secs.    Baseline 45.72 LUE at eval    Time 4    Period Weeks    Status On-going   44.56s with LUE 11/30/2020     OT SHORT TERM GOAL #5   Title Pt will sequence a simple functional or ADL task  with 90% or better accuracy .    Time 4    Period Weeks    Status On-going      OT SHORT TERM GOAL #6   Title Pt will demonstrate improved fine motor coordination for ADLs as evidenced by decreasing 3 button/ unbutton time to 90 secs or less.    Time 4    Period Weeks    Status On-going             OT Long Term Goals - 11/01/20 1601      OT LONG TERM GOAL #1   Title Pt/ wife will be I with updated HEP for proximal strength.    Time 12    Period Weeks    Status New    Target Date 01/24/21      OT LONG TERM GOAL #2   Title Pt will demonstrate ability to perform simple beverage / snack prep modifeied indpendent demonstraing good saftey awareness.    Time 12    Period Weeks    Status New      OT LONG TERM GOAL #3  Title Pt will perforrm bathing and  dressing mod I in a reasonable  amout of time    Baseline increased time required    Time 12    Period Weeks    Status New      OT LONG TERM GOAL #4   Title Pt will demonstrate adequate bilateral UE strength to retrieve a lightweight object (3 lbs) from eye level  shelf without dropping with right and left UE's individually.    Time 12    Period Weeks    Status New      OT LONG TERM GOAL #5   Title Pt will increase standing functional reach test score to 10 inches or greater with RUE to minimize fall risk during ADLs.    Time 12    Period Weeks    Status New                 Plan - 11/30/20 1452    Clinical Impression Statement Pt reports increased participation and independence with ADLs but reports continued cognitive deficits and difficulty with L hand. Pt progressing towards goals.    OT Occupational Profile and History Detailed Assessment- Review of Records and additional review of physical, cognitive, psychosocial history related to current functional performance    Occupational performance deficits (Please refer to evaluation for details): ADL's;IADL's;Leisure;Social Participation    Body Structure / Function / Physical Skills ADL;UE functional use;Balance;FMC;GMC;Coordination;Decreased knowledge of use of DME;IADL;Dexterity;Strength;Mobility    Cognitive Skills Memory;Safety Awareness;Problem Solve;Sequencing;Thought;Understand;Attention    OT Frequency 2x / week    OT Duration 12 weeks    OT Treatment/Interventions Self-care/ADL training;Energy conservation;Patient/family education;DME and/or AE instruction;Aquatic Therapy;Paraffin;Passive range of motion;Balance training;Fluidtherapy;Cryotherapy;Therapist, nutritional;Therapeutic activities;Manual Therapy;Therapeutic exercise;Moist Heat;Neuromuscular education;Cognitive remediation/compensation    Plan continue to check STGs, safety for ADLs, dynamic standing balance wth gait belt           Patient will benefit from skilled therapeutic  intervention in order to improve the following deficits and impairments:   Body Structure / Function / Physical Skills: ADL,UE functional use,Balance,FMC,GMC,Coordination,Decreased knowledge of use of DME,IADL,Dexterity,Strength,Mobility Cognitive Skills: Memory,Safety Awareness,Problem Solve,Sequencing,Thought,Understand,Attention     Visit Diagnosis: Other lack of coordination  Frontal lobe and executive function deficit  Attention and concentration deficit  Unsteadiness on feet  Muscle weakness (generalized)    Problem List Patient Active Problem List   Diagnosis Date Noted  . AAA (abdominal aortic aneurysm) without rupture ([place]) 11/14/2020  . Subdural hematoma ([place]) 08/15/2020  . Iron deficiency anemia due to chronic blood loss 01/06/2019  . Orthostatic hypotension 01/05/2019    Class: Question of  . Normocytic normochromic anemia 01/05/2019  . Near syncope 01/05/2019  . Chronic respiratory failure with hypoxia ([place]) 06/30/2018  . OSA (obstructive sleep apnea) 02/11/2018  . Physical deconditioning 08/11/2017  . Shortness of breath 10/24/2014  . COPD (chronic obstructive pulmonary disease) with emphysema ([place]) 09/01/2014  . Fatigue 10/14/2011  . Atrial fibrillation ([place]) 05/15/2011  . Premature ventricular contractions (PVCs) ([facility]) 02/25/2011  . 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  . PERSISTENT DISORDER INITIATING/MAINTAINING SLEEP 05/10/2010  . HYPERCHOLESTEROLEMIA 05/09/2010  . GOUT 05/09/2010  . OBESITY 05/09/2010  . Obstructive sleep apnea 05/09/2010  . Essential hypertension 05/09/2010  . VENTRICULAR TACHYCARDIA 05/09/2010    Zachery Conch MOT, OTR/L  11/30/2020, 4:58 PM  [facility] [address] [place], [place], [address] Phone: [phone]   Fax:  [phone]  Name: RODMAN RECUPERO MRN: [id number] Date of Birth: 1934-06-24

## 2020-11-30 NOTE — Therapy (Signed)
[facility] [address] [place], [place], [address] Phone: [phone]   Fax:  [phone]  Speech Language Pathology Treatment  Patient Details  Name: [name] MRN: [id number] Date of Birth: 10-Jun-1934 Referring Provider (SLP): Margie Billet, PA-C; Dr. [name] PCP (doc)   Encounter Date: 11/30/2020   End of Session - 11/30/20 1901    Visit Number 3    Number of Visits 17    Date for SLP Re-Evaluation 01/29/21    SLP Start Time 1533    SLP Stop Time  1615    SLP Time Calculation (min) 42 min    Activity Tolerance Patient tolerated treatment well           Past Medical History:  Diagnosis Date  . Afib ([place])   . Arthritis   . BPH (benign prostatic hyperplasia)   . COPD (chronic obstructive pulmonary disease) ([place])   . Coronary artery disease   . Coronary artery ectasia    Mild CAD with normal systolic function per cath in August of 2011  . Depression   . Diaphragmatic paralysis    felt to be partially responsible for dyspnea  . Dyspnea    due to paralyzed diaphragm and pulmonary issues  . Dysrhythmia   . GERD (gastroesophageal reflux disease)   . Gout   . HTN (hypertension)   . Hypercholesteremia   . Hypertension   . Iron deficiency anemia   . Microhematuria   . Obesity   . OSA (obstructive sleep apnea)    CPAP  . Paroxysmal atrial fibrillation (H[facility])    occured in the setting of acute E Coli sepsis and ileius 7/12  . PVC (premature ventricular contraction)    s/p PVC ablation 05/29/2010    Past Surgical History:  Procedure Laterality Date  . APPENDECTOMY  1978  . BIOPSY  02/09/2019   Procedure: BIOPSY;  Surgeon: Clarene Essex, MD;  Location: [facility];  Service: Endoscopy;;  . BIOPSY  03/02/2019   Procedure: BIOPSY;  Surgeon: Clarene Essex, MD;  Location: [facility];  Service: Endoscopy;;  . CARDIAC CATHETERIZATION  04-30-2010   Left main coronary artery is normal.   .  CARDIOVASCULAR STRESS TEST  03-19-2010   0%  . CHOLECYSTECTOMY  1990  . COLONOSCOPY N/A 03/02/2019   Procedure: COLONOSCOPY;  Surgeon: Clarene Essex, MD;  [facility];  Service: Endoscopy;  Laterality: N/A;  . COLONOSCOPY W/ POLYPECTOMY    . ESOPHAGOGASTRODUODENOSCOPY (EGD) WITH PROPOFOL N/A 02/09/2019   Procedure: ESOPHAGOGASTRODUODENOSCOPY (EGD) WITH PROPOFOL;  Surgeon: Clarene Essex, MD;  [facility];  Service: Endoscopy;  Laterality: N/A;  . EYE SURGERY Right    catarct  . poly removed from nose as a child    . PVC ablation  05/29/2010  . RADIOLOGY WITH ANESTHESIA N/A 11/07/2020   Procedure: MRV HEAD WITH AND WITHOUT;  Surgeon: Radiologist, Medication, MD;  Location: [facility];  Service: Radiology;  Laterality: N/A;  . RADIOLOGY WITH ANESTHESIA N/A 11/07/2020   Procedure: CT LUMBAR SPINE WITHOUT;  Surgeon: Radiologist, Medication, MD;  Location: [facility];  Service: Radiology;  Laterality: N/A;  . US ECHOCARDIOGRAPHY  03-09-2010   EF 55-60%    There were no vitals filed for this visit.   Subjective Assessment - 11/30/20 1540    Subjective "I get mixed up with mixing numbers - like we did last time."    Currently in Pain? No/denies  ADULT SLP TREATMENT - 11/30/20 1541      General Information   Behavior/Cognition Alert;Cooperative;Pleasant mood      Treatment Provided   Treatment provided Cognitive-Linquistic      Cognitive-Linquistic Treatment   Treatment focused on Cognition    Skilled Treatment SLP asked pt re: memory strategies - pt states he has a calendar on his phone and in his room. Pt recalled details from OT that he had errors with pipe tree. SLP explored some problem solving with pt about how to avoid this next time. "Pay more attention" - SLP encouraged pt to be more specific and he req'd cues to think of self- correction/ double checking responses. SLP then engaged pt in a simple written task where he had to alternate attention and double  check his answers. After the first 2 rows of letters SLP cued pt to double check and then pt independently double checked for the last 3 rows, and self corrected 0/2 errors. Pt's error awareness in a functional math (menu addition) task was minimal and he had more difficulty with alternating attention due to more detailed responses necessary.      Assessment / Recommendations / Plan   Plan Continue with current plan of care      Progression Toward Goals   Progression toward goals Progressing toward goals              SLP Short Term Goals - 11/30/20 1902      SLP SHORT TERM GOAL #1   Title pt will perform functional reading and/or written tasks for 7 minutes in 2 sessions    Time 4    Period Weeks    Status On-going      SLP SHORT TERM GOAL #2   Title pt will process verbal or written directions with 80% accuracy with occasional min A    Time 4    Period Weeks    Status On-going      SLP SHORT TERM GOAL #3   Title pt will verbally state 3 strategies to improve memory skills in 3 sessions    Time 4    Period Weeks    Status On-going      SLP SHORT TERM GOAL #4   Title pt will demo understanding of his non-physical deficits folloiwng brain bleed with modified independence in 3 sessions    Time 4    Period Weeks    Status On-going            SLP Long Term Goals - 11/30/20 1902      SLP LONG TERM GOAL #1   Title Pt will use memory compensation system for daily tasks when appropriate, x1 in 4 sessions    Time 8    Period Weeks   or 17 visits, for all LTGs   Status On-going      SLP LONG TERM GOAL #2   Title pt will demonstrate error awareness in linguistic tasks 90% of the time with nonverbal cues in 3 sessions    Time 8    Period Weeks    Status On-going      SLP LONG TERM GOAL #3   Title pt and/or wife will report pt has completed tasks demonstrating ability to solve problems in the home environment x3 sessions    Time 8    Period Weeks    Status On-going       SLP LONG TERM GOAL #4   Title pt will show knowledge of compensating  for cognitive deficits in linguistic tasks    Time 8    Period Weeks    Status On-going            Plan - 11/30/20 1902    Clinical Impression Statement [name] presents today with cognitive communication deficits in areas of attention, problem solving, processing, awareness, and memory. Pt also demonstrated decr'd attention to detail in today's session. Presence of aphasia still cannot be completely ruled out at this time - and will be monitored. Aphasia eval to take place if clinically indicated. SLP believes pt will benefit from skilled ST tageting these cognitive deficits and assist pt to regain his PLOF.    Speech Therapy Frequency 2x / week    Duration 8 weeks   or 17 total sessions   Treatment/Interventions Language facilitation;Compensatory techniques;Internal/external aids;SLP instruction and feedback;Patient/family education;Environmental controls;Cognitive reorganization    Potential to Achieve Goals Good    Consulted and Agree with Plan of Care Patient;Family member/caregiver           Patient will benefit from skilled therapeutic intervention in order to improve the following deficits and impairments:   Cognitive communication deficit    Problem List Patient Active Problem List   Diagnosis Date Noted  . AAA (abdominal aortic aneurysm) without rupture ([place]) 11/14/2020  . Subdural hematoma ([place]) 08/15/2020  . Iron deficiency anemia due to chronic blood loss 01/06/2019  . Orthostatic hypotension 01/05/2019    Class: Question of  . Normocytic normochromic anemia 01/05/2019  . Near syncope 01/05/2019  . Chronic respiratory failure with hypoxia ([place]) 06/30/2018  . OSA (obstructive sleep apnea) 02/11/2018  . Physical deconditioning 08/11/2017  . Shortness of breath 10/24/2014  . COPD (chronic obstructive pulmonary disease) with emphysema ([place]) 09/01/2014  . Fatigue 10/14/2011  .  Atrial fibrillation ([place]) 05/15/2011  . Premature ventricular contractions (PVCs) ([facility]) 02/25/2011  . 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  . PERSISTENT DISORDER INITIATING/MAINTAINING SLEEP 05/10/2010  . HYPERCHOLESTEROLEMIA 05/09/2010  . GOUT 05/09/2010  . OBESITY 05/09/2010  . Obstructive sleep apnea 05/09/2010  . Essential hypertension 05/09/2010  . VENTRICULAR TACHYCARDIA 05/09/2010    [facility] ,MS, CCC-SLP  11/30/2020, 7:03 PM  [facility] [address] [place], [place], [address] Phone: [phone]   Fax:  [phone]   Name: [name] MRN: [id number] Date of Birth: 1933/10/31

## 2020-12-05 ENCOUNTER — Encounter: Payer: Self-pay | Admitting: Occupational Therapy

## 2020-12-05 ENCOUNTER — Ambulatory Visit: Payer: Medicare Other | Admitting: Physical Therapy

## 2020-12-05 ENCOUNTER — Encounter: Payer: Self-pay | Admitting: Physical Therapy

## 2020-12-05 ENCOUNTER — Ambulatory Visit: Payer: Medicare Other | Admitting: Occupational Therapy

## 2020-12-05 ENCOUNTER — Ambulatory Visit: Payer: Medicare Other

## 2020-12-05 ENCOUNTER — Other Ambulatory Visit: Payer: Self-pay

## 2020-12-05 DIAGNOSIS — M6281 Muscle weakness (generalized): Secondary | ICD-10-CM

## 2020-12-05 DIAGNOSIS — R2689 Other abnormalities of gait and mobility: Secondary | ICD-10-CM

## 2020-12-05 DIAGNOSIS — R278 Other lack of coordination: Secondary | ICD-10-CM

## 2020-12-05 DIAGNOSIS — R2681 Unsteadiness on feet: Secondary | ICD-10-CM

## 2020-12-05 DIAGNOSIS — R41840 Attention and concentration deficit: Secondary | ICD-10-CM

## 2020-12-05 DIAGNOSIS — R41844 Frontal lobe and executive function deficit: Secondary | ICD-10-CM

## 2020-12-05 DIAGNOSIS — R41841 Cognitive communication deficit: Secondary | ICD-10-CM

## 2020-12-05 NOTE — Therapy (Signed)
[facility] [address] [place], [place], [address] Phone: [phone]   Fax:  [phone]  Occupational Therapy Treatment  Patient Details  Name: [name] MRN: [id number] Date of Birth: Jan 15, 1934 Referring Provider (OT): Dr. Brett Fairy (referred by hospitalist with send to neurology)   Encounter Date: 12/05/2020   OT End of Session - 12/05/20 1704    Visit Number 7    Number of Visits 25    Date for OT Re-Evaluation 01/24/21    Authorization Type [employer] Medicare    Authorization Time Period 90 days, anticipate d/c after 8 weeks    Authorization - Visit Number 7    Authorization - Number of Visits 10    OT Start Time 4970    OT Stop Time 1745    OT Time Calculation (min) 41 min    Activity Tolerance Patient tolerated treatment well    Behavior During Therapy [facility] for tasks assessed/performed           Past Medical History:  Diagnosis Date  . Afib ([place])   . Arthritis   . BPH (benign prostatic hyperplasia)   . COPD (chronic obstructive pulmonary disease) ([place])   . Coronary artery disease   . Coronary artery ectasia    Mild CAD with normal systolic function per cath in August of 2011  . Depression   . Diaphragmatic paralysis    felt to be partially responsible for dyspnea  . Dyspnea    due to paralyzed diaphragm and pulmonary issues  . Dysrhythmia   . GERD (gastroesophageal reflux disease)   . Gout   . HTN (hypertension)   . Hypercholesteremia   . Hypertension   . Iron deficiency anemia   . Microhematuria   . Obesity   . OSA (obstructive sleep apnea)    CPAP  . Paroxysmal atrial fibrillation ([facility])    occured in the setting of acute E Coli sepsis and ileius 7/12  . PVC (premature ventricular contraction)    s/p PVC ablation 05/29/2010    Past Surgical History:  Procedure Laterality Date  . APPENDECTOMY  1978  . BIOPSY  02/09/2019   Procedure: BIOPSY;  Surgeon: Clarene Essex, MD;  [facility];  Service: Endoscopy;;  . BIOPSY  03/02/2019   Procedure: BIOPSY;  Surgeon: Clarene Essex, MD;  [facility];  Service: Endoscopy;;  . CARDIAC CATHETERIZATION  04-30-2010   Left main coronary artery is normal.   . CARDIOVASCULAR STRESS TEST  03-19-2010   0%  . CHOLECYSTECTOMY  1990  . COLONOSCOPY N/A 03/02/2019   Procedure: COLONOSCOPY;  Surgeon: Clarene Essex, MD;  [facility];  Service: Endoscopy;  Laterality: N/A;  . COLONOSCOPY W/ POLYPECTOMY    . ESOPHAGOGASTRODUODENOSCOPY (EGD) WITH PROPOFOL N/A 02/09/2019   Procedure: ESOPHAGOGASTRODUODENOSCOPY (EGD) WITH PROPOFOL;  Surgeon: Clarene Essex, MD;  [facility];  Service: Endoscopy;  Laterality: N/A;  . EYE SURGERY Right    catarct  . poly removed from nose as a child    . PVC ablation  05/29/2010  . RADIOLOGY WITH ANESTHESIA N/A 11/07/2020   Procedure: MRV HEAD WITH AND WITHOUT;  Surgeon: Radiologist, Medication, MD;  Location: [facility];  Service: Radiology;  Laterality: N/A;  . RADIOLOGY WITH ANESTHESIA N/A 11/07/2020   Procedure: CT LUMBAR SPINE WITHOUT;  Surgeon: Radiologist, Medication, MD;  Location: [facility];  Service: Radiology;  Laterality: N/A;  . US ECHOCARDIOGRAPHY  03-09-2010   EF 55-60%    There were no vitals  filed for this visit.   Subjective Assessment - 12/05/20 1704    Subjective  Pt denies any pain.    Pertinent History Pt had a fall while shopping in [employer] on 08/15/20. Pt sustained a parafalcine and tentorial SDH. Was discharged from the hospital on 08/17/20.PMH:HTN, CAD, COPD, A-fib    Patient Stated Goals improve balance and strength    Currently in Pain? No/denies             TREATMENT:  Grooved Pegs with LUE for increase in fine motor coordination. Min difficulty with rotating and placing pegs into board.    Perfection with LUE for coordination. Pt with mod difficulty with spatial orientation of the pieces. Min difficulty with coordination with LUE and rotating pieces. Pt had more  difficulty with board on incline than flat on table.      Constant Therapy: Read a Map Level 1 Pt required max assistance for alternating between looking at map and question/answer for question 1 and determining the correct response. Part difficulty with answering questions and procedure and partly d/t lack of technology skills for task. Did not finish task d/t difficulty. Pt completed Same Symbol Level 6 with 92% accuracy and 50.81s response time.                OT Short Term Goals - 12/05/20 1722      OT SHORT TERM GOAL #1   Title Pt/ caregeiver will be I with HEP. 11/29/20    Time 4    Period Weeks    Status On-going    [place] Date 11/29/20      OT SHORT TERM GOAL #2   Title Pt / wife will verbalize understanding of strategies to increase safety and I with ADLS.    Time 4    Period Weeks    Status Achieved      OT SHORT TERM GOAL #3   Title Pt will perfrom transitional movments for ADLS with close supervision and no LOB.    Baseline histroy of falls    Time 4    Period Weeks    Status Achieved      OT SHORT TERM GOAL #4   Title Pt will demonstrate improved fine motor coordination in his LUE as eveidenced by decreasing 9-hole peg test score by 4 secs.    Baseline 45.72 LUE at eval    Time 4    Period Weeks    Status On-going   44.56s with LUE 11/30/2020     OT SHORT TERM GOAL #5   Title Pt will sequence a simple functional or ADL task  with 90% or better accuracy .    Time 4    Period Weeks    Status On-going      OT SHORT TERM GOAL #6   Title Pt will demonstrate improved fine motor coordination for ADLs as evidenced by decreasing 3 button/ unbutton time to 90 secs or less.    Time 4    Period Weeks    Status On-going             OT Long Term Goals - 11/01/20 7062      OT LONG TERM GOAL #1   Title Pt/ wife will be I with updated HEP for proximal strength.    Time 12    Period Weeks    Status New    [place] Date 01/24/21      OT LONG TERM GOAL  #2   Title Pt will  demonstrate ability to perform simple beverage / snack prep modifeied indpendent demonstraing good saftey awareness.    Time 12    Period Weeks    Status New      OT LONG TERM GOAL #3   Title Pt will perforrm bathing and  dressing mod I in a reasonable amout of time    Baseline increased time required    Time 12    Period Weeks    Status New      OT LONG TERM GOAL #4   Title Pt will demonstrate adequate bilateral UE strength to retrieve a lightweight object (3 lbs) from eye level  shelf without dropping with right and left UE's individually.    Time 12    Period Weeks    Status New      OT LONG TERM GOAL #5   Title Pt will increase standing functional reach test score to 10 inches or greater with RUE to minimize fall risk during ADLs.    Time 12    Period Weeks    Status New                 Plan - 12/05/20 1719    Clinical Impression Statement Pt demonstrates progress towards goals. Pt continues to have deficits in cognition, spatial/visual awareness and fine motor coordination with LUE.    OT Occupational Profile and History Detailed Assessment- Review of Records and additional review of physical, cognitive, psychosocial history related to current functional performance    Occupational performance deficits (Please refer to evaluation for details): ADL's;IADL's;Leisure;Social Participation    Body Structure / Function / Physical Skills ADL;UE functional use;Balance;FMC;GMC;Coordination;Decreased knowledge of use of DME;IADL;Dexterity;Strength;Mobility    Cognitive Skills Memory;Safety Awareness;Problem Solve;Sequencing;Thought;Understand;Attention    OT Frequency 2x / week    OT Duration 12 weeks    OT Treatment/Interventions Self-care/ADL training;Energy conservation;Patient/family education;DME and/or AE instruction;Aquatic Therapy;Paraffin;Passive range of motion;Balance training;Fluidtherapy;Cryotherapy;Therapist, nutritional;Therapeutic  activities;Manual Therapy;Therapeutic exercise;Moist Heat;Neuromuscular education;Cognitive remediation/compensation    Plan visualspatial, LUE coordination, PVC           Patient will benefit from skilled therapeutic intervention in order to improve the following deficits and impairments:   Body Structure / Function / Physical Skills: ADL,UE functional use,Balance,FMC,GMC,Coordination,Decreased knowledge of use of DME,IADL,Dexterity,Strength,Mobility Cognitive Skills: Memory,Safety Awareness,Problem Solve,Sequencing,Thought,Understand,Attention     Visit Diagnosis: Unsteadiness on feet  Other lack of coordination  Muscle weakness (generalized)  Other abnormalities of gait and mobility  Frontal lobe and executive function deficit  Attention and concentration deficit    Problem List Patient Active Problem List   Diagnosis Date Noted  . AAA (abdominal aortic aneurysm) without rupture ([place]) 11/14/2020  . Subdural hematoma ([place]) 08/15/2020  . Iron deficiency anemia due to chronic blood loss 01/06/2019  . Orthostatic hypotension 01/05/2019    Class: Question of  . Normocytic normochromic anemia 01/05/2019  . Near syncope 01/05/2019  . Chronic respiratory failure with hypoxia ([place]) 06/30/2018  . OSA (obstructive sleep apnea) 02/11/2018  . Physical deconditioning 08/11/2017  . Shortness of breath 10/24/2014  . COPD (chronic obstructive pulmonary disease) with emphysema ([place]) 09/01/2014  . Fatigue 10/14/2011  . Atrial fibrillation ([place]) 05/15/2011  . Premature ventricular contractions (PVCs) ([facility]) 02/25/2011  . 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  . PERSISTENT DISORDER INITIATING/MAINTAINING SLEEP 05/10/2010  . HYPERCHOLESTEROLEMIA 05/09/2010  . GOUT 05/09/2010  . OBESITY 05/09/2010  . Obstructive sleep apnea 05/09/2010  . Essential hypertension 05/09/2010  . VENTRICULAR TACHYCARDIA 05/09/2010    Zachery Conch MOT, OTR/L  12/05/2020,  5:24 PM  [facility] [address] [place], [place], [address] Phone: [phone]   Fax:  [phone]  Name: [name] MRN: [id number] Date of Birth: March 27, 1934

## 2020-12-05 NOTE — Therapy (Signed)
[facility] [address] [place], [place], [address] Phone: [phone]   Fax:  [phone]  Physical Therapy Treatment/10th Visit Progress Note  Patient Details  Name: [name] MRN: [id number] Date of Birth: 1934/05/30 Referring Provider (PT): Dohmeier, Asencion Partridge, MD (is being followed by, was referred by hospitalist)  10th Visit Physical Therapy Progress Note  Dates of Reporting Period: 10/24/20 to 12/05/20    Encounter Date: 12/05/2020   PT End of Session - 12/05/20 1700    Visit Number 10    Number of Visits 17    Date for PT Re-Evaluation 01/22/21    Authorization Type [employer] Medicare    PT Start Time 1618    PT Stop Time 1658    PT Time Calculation (min) 40 min    Equipment Utilized During Treatment Gait belt    Activity Tolerance Patient tolerated treatment well    Behavior During Therapy [facility] for tasks assessed/performed           Past Medical History:  Diagnosis Date  . Afib ([place])   . Arthritis   . BPH (benign prostatic hyperplasia)   . COPD (chronic obstructive pulmonary disease) ([place])   . Coronary artery disease   . Coronary artery ectasia    Mild CAD with normal systolic function per cath in August of 2011  . Depression   . Diaphragmatic paralysis    felt to be partially responsible for dyspnea  . Dyspnea    due to paralyzed diaphragm and pulmonary issues  . Dysrhythmia   . GERD (gastroesophageal reflux disease)   . Gout   . HTN (hypertension)   . Hypercholesteremia   . Hypertension   . Iron deficiency anemia   . Microhematuria   . Obesity   . OSA (obstructive sleep apnea)    CPAP  . Paroxysmal atrial fibrillation (HCC)    occured in the setting of acute E Coli sepsis and ileius 7/12  . PVC (premature ventricular contraction)    s/p PVC ablation 05/29/2010    Past Surgical History:  Procedure Laterality Date  . APPENDECTOMY  1978  . BIOPSY  02/09/2019   Procedure: BIOPSY;   Surgeon: Clarene Essex, MD;  [facility];  Service: Endoscopy;;  . BIOPSY  03/02/2019   Procedure: BIOPSY;  Surgeon: Clarene Essex, MD;  [facility];  Service: Endoscopy;;  . CARDIAC CATHETERIZATION  04-30-2010   Left main coronary artery is normal.   . CARDIOVASCULAR STRESS TEST  03-19-2010   0%  . CHOLECYSTECTOMY  1990  . COLONOSCOPY N/A 03/02/2019   Procedure: COLONOSCOPY;  Surgeon: Clarene Essex, MD;  [facility];  Service: Endoscopy;  Laterality: N/A;  . COLONOSCOPY W/ POLYPECTOMY    . ESOPHAGOGASTRODUODENOSCOPY (EGD) WITH PROPOFOL N/A 02/09/2019   Procedure: ESOPHAGOGASTRODUODENOSCOPY (EGD) WITH PROPOFOL;  Surgeon: Clarene Essex, MD;  [facility];  Service: Endoscopy;  Laterality: N/A;  . EYE SURGERY Right    catarct  . poly removed from nose as a child    . PVC ablation  05/29/2010  . RADIOLOGY WITH ANESTHESIA N/A 11/07/2020   Procedure: MRV HEAD WITH AND WITHOUT;  Surgeon: Radiologist, Medication, MD;  Location: [facility];  Service: Radiology;  Laterality: N/A;  . RADIOLOGY WITH ANESTHESIA N/A 11/07/2020   Procedure: CT LUMBAR SPINE WITHOUT;  Surgeon: Radiologist, Medication, MD;  Location: [facility];  Service: Radiology;  Laterality: N/A;  . US ECHOCARDIOGRAPHY  03-09-2010   EF 55-60%    There were no vitals  filed for this visit.   Subjective Assessment - 12/05/20 1622    Subjective No changes since he was last here.No falls.    Patient is accompained by: Family member   wife, [name]   Pertinent History HTN, CAD, COPD, A-fib    Limitations Standing;Walking    How long can you sit comfortably? 12'    How long can you stand comfortably? 38'    How long can you walk comfortably? 26'    Patient Stated Goals wants to improve his balance    Currently in Pain? No/denies                             [facility] Adult PT Treatment/Exercise - 12/05/20 1622      Transfers   Comments x10 reps sit <> stand on blue air ex at edge of mat table, no UE  support, cues for slowed descent, min guard at times for balance in standing      Ambulation/Gait   Ambulation/Gait Yes    Ambulation/Gait Assistance 4: Min guard    Ambulation/Gait Assistance Details clinic distances with use of SPC with quad tip, between activities    Assistive device Straight cane;Rollator   with quad tip   Gait Pattern Step-through pattern;Decreased arm swing - left    Ambulation Surface Level;Indoor      Knee/Hip Exercises: Aerobic   Nustep 8' L2 (for 5 minutes) and L3 (for remainder of 3 minutes) with BLE and BUE for strengthening and activity tolerance, arms 10               Balance Exercises - 12/05/20 1636      Balance Exercises: Standing   Standing Eyes Opened Wide (BOA);Head turns;Limitations    Standing Eyes Opened Limitations Standing on blue air ex: 2 x 10 reps head turns, 2 x 10 head nods - beginning with wider BOS and then bringing feet hips width distance    Standing Eyes Closed Wide (BOA);Foam/compliant surface;30 secs;Limitations    Standing Eyes Closed Limitations 3 reps of 30 seconds, beginning with fingertip support and then none    Step Ups Forward;Lateral;UE support 1;Limitations;UE support 2;4 inch    Step Ups Limitations on black aerobic step, leading with RLE forward step ups x10 reps, lateral step ups x10 reps    Step Over Hurdles / Cones in // bars: with 2 orange floor ladders and 2 4" foam beams, stepping over with RLE first in a step to pattern down and back x3 reps, incr difficulty with RLE foot clearance, cues for proper sequencing              PT Short Term Goals - 11/28/20 1455      PT SHORT TERM GOAL #1   Title Pt will initiate HEP in order to indicate improved functional mobility and decreased fall risk.  ALL STGS DUE 11/21/20    Baseline reviewed HEP on 11/28/20    Time 4    Period Weeks    Status Achieved    Target Date 11/21/20      PT SHORT TERM GOAL #2   Title Pt will perform 5x sit <> stand in 17 seconds or less  with BUE support in order to demo improved functional strength.    Baseline 19.31 seconds, 10.97 seconds with BUE support on 11/28/20    Time 4    Period Weeks    Status Achieved      PT SHORT  TERM GOAL #3   Title Pt will undergo BERG with STG and LTG written as appropriate.    Baseline 10/27/20 BERG 37/56    Time 4    Period Weeks    Status Achieved      PT SHORT TERM GOAL #4   Title Pt will improve gait speed with rollator to at least 2.5 ft/sec in order demo improved community mobility.    Baseline 2.18 ft/sec, 14.41 seconds = 2.27 ft/sec on 3/22    Time 4    Period Weeks    Status Not Met      PT SHORT TERM GOAL #5   Title Pt and pt's spouse will verbalize understanding of fall prevention in the home to decr fall risk.    Time 4    Period Weeks    Status New             PT Long Term Goals - 10/27/20 1827      PT LONG TERM GOAL #1   Title Pt will be independent with final HEP in order to indicate improved functional mobility and decreased fall risk.  ALL LTGS DUE 12/19/20    Time 8    Period Weeks    Status New      PT LONG TERM GOAL #2   Title BERG goal to be written as appropriate to determine decr fall risk.  BERG goal is 45/56    Baseline 37    Time 8    Period Weeks    Status New      PT LONG TERM GOAL #3   Title Pt will perform 5x sit <> stand in 14.5 seconds or less with BUE vs. single UE support in order to demo improved functional strength.    Baseline 19.31 seconds    Time 8    Period Weeks    Status New      PT LONG TERM GOAL #4   Title Pt will ambulate at least 150' over indoor surfaces with cane vs. LRAD with supervision in order to demo improved household mobility.    Time 8    Period Weeks    Status New      PT LONG TERM GOAL #5   Title Pt will perform TUG in 14.5 seconds or less with rollator vs. LRAD in order to demo decr fall risk.    Baseline 16.94 seconds with rollator.    Time 8    Period Weeks    Status New                  Plan - 12/06/20 0921    Clinical Impression Statement 10th visit PN: STGs assessed on 11/28/20, Pt met met STG #2 - improved 5x sit <> stand with BUE support from 19.31 seconds to 10.97 seconds. PT did not meet goal in regards to gait speed with rollator, pt performed in 2.27 ft/sec indicating a limited community ambulator. Pt with improvements in gait with SPC with quad tip, needing min guard between activities in session. Today's skilled session focused on BLE strengthening, balance on compliant surfaces, and RLE foot clearance with hurdles. Pt with incr difficulty with RLE hip flexion when needing to clear obstacles. Will continue to progress towards LTGs.    Personal Factors and Comorbidities Comorbidity 3+;Past/Current Experience    Comorbidities HTN, CAD, COPD, A-fib, hx of COVID march 2021    Examination-Activity Limitations Stand;Squat;Transfers;Reach Overhead;Locomotion Level    Examination-Participation Restrictions Community Activity;Driving    Stability/Clinical  Decision Making Evolving/Moderate complexity    Rehab Potential Good    PT Frequency 2x / week    PT Duration 8 weeks    PT Treatment/Interventions ADLs/Self Care Home Management;DME Instruction;Gait training;Functional mobility training;Stair training;Neuromuscular re-education;Balance training;Therapeutic exercise;Therapeutic activities;Patient/family education;Vestibular;Energy conservation    PT Next Visit Plan continue stepping tasks with focus on RLE flexion activities and placement strategies for R foot, revisit small hurdles. forwrad and lateral step ups.    Consulted and Agree with Plan of Care Patient           Patient will benefit from skilled therapeutic intervention in order to improve the following deficits and impairments:  Abnormal gait,Decreased activity tolerance,Decreased balance,Decreased endurance,Decreased coordination,Decreased cognition,Decreased knowledge of use of DME,Decreased range of  motion,Difficulty walking,Decreased safety awareness,Decreased strength  Visit Diagnosis: Unsteadiness on feet  Muscle weakness (generalized)  Other lack of coordination  Other abnormalities of gait and mobility     Problem List Patient Active Problem List   Diagnosis Date Noted  . AAA (abdominal aortic aneurysm) without rupture ([place]) 11/14/2020  . Subdural hematoma ([place]) 08/15/2020  . Iron deficiency anemia due to chronic blood loss 01/06/2019  . Orthostatic hypotension 01/05/2019    Class: Question of  . Normocytic normochromic anemia 01/05/2019  . Near syncope 01/05/2019  . Chronic respiratory failure with hypoxia ([place]) 06/30/2018  . OSA (obstructive sleep apnea) 02/11/2018  . Physical deconditioning 08/11/2017  . Shortness of breath 10/24/2014  . COPD (chronic obstructive pulmonary disease) with emphysema ([place]) 09/01/2014  . Fatigue 10/14/2011  . Atrial fibrillation ([place]) 05/15/2011  . Premature ventricular contractions (PVCs) ([facility]) 02/25/2011  . 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  . PERSISTENT DISORDER INITIATING/MAINTAINING SLEEP 05/10/2010  . HYPERCHOLESTEROLEMIA 05/09/2010  . GOUT 05/09/2010  . OBESITY 05/09/2010  . Obstructive sleep apnea 05/09/2010  . Essential hypertension 05/09/2010  . VENTRICULAR TACHYCARDIA 05/09/2010    Arliss Journey, PT, DPT  12/06/2020, 9:21 AM  [facility] [address] [place], [place], [address] Phone: [phone]   Fax:  [phone]  Name: [name] MRN: [id number] Date of Birth: Aug 15, 1934

## 2020-12-06 NOTE — Therapy (Signed)
[facility] [address] [place], [place], [address] Phone: [phone]   Fax:  [phone]  Speech Language Pathology Treatment  Patient Details  Name: [name] MRN: [id number] Date of Birth: 1934/08/26 Referring Provider (SLP): Margie Billet, PA-C; Dr. [name] PCP (doc)   Encounter Date: 12/05/2020   End of Session - 12/06/20 1100    Visit Number 4    Number of Visits 17    Date for SLP Re-Evaluation 01/29/21    SLP Start Time 1533    SLP Stop Time  1615    SLP Time Calculation (min) 42 min    Activity Tolerance Patient tolerated treatment well           Past Medical History:  Diagnosis Date  . Afib ([place])   . Arthritis   . BPH (benign prostatic hyperplasia)   . COPD (chronic obstructive pulmonary disease) ([place])   . Coronary artery disease   . Coronary artery ectasia    Mild CAD with normal systolic function per cath in August of 2011  . Depression   . Diaphragmatic paralysis    felt to be partially responsible for dyspnea  . Dyspnea    due to paralyzed diaphragm and pulmonary issues  . Dysrhythmia   . GERD (gastroesophageal reflux disease)   . Gout   . HTN (hypertension)   . Hypercholesteremia   . Hypertension   . Iron deficiency anemia   . Microhematuria   . Obesity   . OSA (obstructive sleep apnea)    CPAP  . Paroxysmal atrial fibrillation (HCC)    occured in the setting of acute E Coli sepsis and ileius 7/12  . PVC (premature ventricular contraction)    s/p PVC ablation 05/29/2010    Past Surgical History:  Procedure Laterality Date  . APPENDECTOMY  1978  . BIOPSY  02/09/2019   Procedure: BIOPSY;  Surgeon: Clarene Essex, MD;  [facility];  Service: Endoscopy;;  . BIOPSY  03/02/2019   Procedure: BIOPSY;  Surgeon: Clarene Essex, MD;  [facility];  Service: Endoscopy;;  . CARDIAC CATHETERIZATION  04-30-2010   Left main coronary artery is normal.   .  CARDIOVASCULAR STRESS TEST  03-19-2010   0%  . CHOLECYSTECTOMY  1990  . COLONOSCOPY N/A 03/02/2019   Procedure: COLONOSCOPY;  Surgeon: Clarene Essex, MD;  [facility];  Service: Endoscopy;  Laterality: N/A;  . COLONOSCOPY W/ POLYPECTOMY    . ESOPHAGOGASTRODUODENOSCOPY (EGD) WITH PROPOFOL N/A 02/09/2019   Procedure: ESOPHAGOGASTRODUODENOSCOPY (EGD) WITH PROPOFOL;  Surgeon: Clarene Essex, MD;  [facility];  Service: Endoscopy;  Laterality: N/A;  . EYE SURGERY Right    catarct  . poly removed from nose as a child    . PVC ablation  05/29/2010  . RADIOLOGY WITH ANESTHESIA N/A 11/07/2020   Procedure: MRV HEAD WITH AND WITHOUT;  Surgeon: Radiologist, Medication, MD;  Location: [facility];  Service: Radiology;  Laterality: N/A;  . RADIOLOGY WITH ANESTHESIA N/A 11/07/2020   Procedure: CT LUMBAR SPINE WITHOUT;  Surgeon: Radiologist, Medication, MD;  Location: [facility];  Service: Radiology;  Laterality: N/A;  . US ECHOCARDIOGRAPHY  03-09-2010   EF 55-60%    There were no vitals filed for this visit.   Subjective Assessment - 12/05/20 1545    Subjective Pt got out his homework and handed it to SLP    Currently in Pain? No/denies  ADULT SLP TREATMENT - 12/06/20 0001      General Information   Behavior/Cognition Alert;Cooperative;Pleasant mood      Treatment Provided   Treatment provided Cognitive-Linquistic      Cognitive-Linquistic Treatment   Treatment focused on Cognition    Skilled Treatment SLP used therapeutic interview to ascertain his current plan for meds is that he has his PM meds turned upside down and AM meds upright , with bedtime meds in a separate drawer. Pt reports to SLP he brought his afternoon dose to therapy today and showed SLP a small red disk with meds in it - "tihs is for 5-6 pm" pt stated. Pt reprots success with this procedure. In structured tasks for attention and attention to detail pt with consistent extra time necessary.      Assessment /  Recommendations / Plan   Plan Continue with current plan of care      Progression Toward Goals   Progression toward goals Progressing toward goals              SLP Short Term Goals - 12/06/20 1101      SLP SHORT TERM GOAL #1   Title pt will perform functional reading and/or written tasks for 7 minutes in 2 sessions    Time 3    Period Weeks    Status On-going      SLP SHORT TERM GOAL #2   Title pt will process verbal or written directions with 80% accuracy with occasional min A x3 vistis    Baseline 12-05-20    Time 3    Period Weeks    Status Revised      SLP SHORT TERM GOAL #3   Title pt will verbally state 3 strategies to improve memory skills in 3 sessions    Time 3    Period Weeks    Status On-going      SLP SHORT TERM GOAL #4   Title pt will demo understanding of his non-physical deficits folloiwng brain bleed with modified independence in 3 sessions    Time 3    Period Weeks    Status On-going            SLP Long Term Goals - 12/06/20 1101      SLP LONG TERM GOAL #1   Title Pt will use memory compensation system for daily tasks when appropriate, x1 in 4 sessions    Time 7    Period Weeks   or 17 visits, for all LTGs   Status On-going      SLP LONG TERM GOAL #2   Title pt will demonstrate error awareness in linguistic tasks 90% of the time with nonverbal cues in 3 sessions    Time 7    Period Weeks    Status On-going      SLP LONG TERM GOAL #3   Title pt and/or wife will report pt has completed tasks demonstrating ability to solve problems in the home environment x3 sessions    Time 7    Period Weeks    Status On-going      SLP LONG TERM GOAL #4   Title pt will show knowledge of compensating for cognitive deficits in linguistic tasks    Time 7    Period Weeks    Status On-going            Plan - 12/06/20 1100    Clinical Impression Statement [name] presents today with cognitive communication deficits in areas  of attention,  problem solving, processing, awareness, and memory. Presence of aphasia still cannot be completely ruled out at this time - and will be monitored. Aphasia eval to take place if clinically indicated. SLP believes pt will benefit from skilled ST tageting these cognitive deficits and assist pt to regain his PLOF.    Speech Therapy Frequency 2x / week    Duration 8 weeks   or 17 total sessions   Treatment/Interventions Language facilitation;Compensatory techniques;Internal/external aids;SLP instruction and feedback;Patient/family education;Environmental controls;Cognitive reorganization    Potential to Achieve Goals Good    Consulted and Agree with Plan of Care Patient;Family member/caregiver           Patient will benefit from skilled therapeutic intervention in order to improve the following deficits and impairments:   Cognitive communication deficit    Problem List Patient Active Problem List   Diagnosis Date Noted  . AAA (abdominal aortic aneurysm) without rupture ([place]) 11/14/2020  . Subdural hematoma ([place]) 08/15/2020  . Iron deficiency anemia due to chronic blood loss 01/06/2019  . Orthostatic hypotension 01/05/2019    Class: Question of  . Normocytic normochromic anemia 01/05/2019  . Near syncope 01/05/2019  . Chronic respiratory failure with hypoxia ([place]) 06/30/2018  . OSA (obstructive sleep apnea) 02/11/2018  . Physical deconditioning 08/11/2017  . Shortness of breath 10/24/2014  . COPD (chronic obstructive pulmonary disease) with emphysema ([place]) 09/01/2014  . Fatigue 10/14/2011  . Atrial fibrillation ([place]) 05/15/2011  . Premature ventricular contractions (PVCs) ([facility]) 02/25/2011  . 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  . PERSISTENT DISORDER INITIATING/MAINTAINING SLEEP 05/10/2010  . HYPERCHOLESTEROLEMIA 05/09/2010  . GOUT 05/09/2010  . OBESITY 05/09/2010  . Obstructive sleep apnea 05/09/2010  . Essential hypertension 05/09/2010  . VENTRICULAR TACHYCARDIA 05/09/2010     [facility] ,MS, CCC-SLP  12/06/2020, 11:02 AM  [facility] [address] [place], [place], [address] Phone: [phone]   Fax:  [phone]   Name: [name] MRN: [id number] Date of Birth: [birth year]

## 2020-12-07 ENCOUNTER — Ambulatory Visit: Payer: Medicare Other | Admitting: Occupational Therapy

## 2020-12-07 ENCOUNTER — Ambulatory Visit: Payer: Medicare Other

## 2020-12-07 ENCOUNTER — Encounter: Payer: Self-pay | Admitting: Occupational Therapy

## 2020-12-07 ENCOUNTER — Other Ambulatory Visit: Payer: Self-pay

## 2020-12-07 DIAGNOSIS — R41840 Attention and concentration deficit: Secondary | ICD-10-CM

## 2020-12-07 DIAGNOSIS — R2689 Other abnormalities of gait and mobility: Secondary | ICD-10-CM

## 2020-12-07 DIAGNOSIS — R41844 Frontal lobe and executive function deficit: Secondary | ICD-10-CM

## 2020-12-07 DIAGNOSIS — R278 Other lack of coordination: Secondary | ICD-10-CM

## 2020-12-07 DIAGNOSIS — R41841 Cognitive communication deficit: Secondary | ICD-10-CM

## 2020-12-07 DIAGNOSIS — M6281 Muscle weakness (generalized): Secondary | ICD-10-CM

## 2020-12-07 DIAGNOSIS — R2681 Unsteadiness on feet: Secondary | ICD-10-CM | POA: Diagnosis not present

## 2020-12-07 NOTE — Therapy (Signed)
[facility] [address] [place], [place], [address] Phone: [phone]   Fax:  [phone]  Physical Therapy Treatment  Patient Details  Name: [name] MRN: [id number] Date of Birth: 01/24/1934 Referring Provider (PT): Dohmeier, Carmen, MD (is being followed by, was referred by hospitalist)   Encounter Date: 12/07/2020   PT End of Session - 12/07/20 1541    Visit Number 11    Number of Visits 17    Date for PT Re-Evaluation 01/22/21    Authorization Type U[employer] Medicare    PT Start Time 1530    PT Stop Time 1615    PT Time Calculation (min) 45 min    Equipment Utilized During Treatment Gait belt    Activity Tolerance Patient tolerated treatment well    Behavior During Therapy WFL for tasks assessed/performed           Past Medical History:  Diagnosis Date  . Afib (HCC)   . Arthritis   . BPH (benign prostatic hyperplasia)   . COPD (chronic obstructive pulmonary disease) (HCC)   . Coronary artery disease   . Coronary artery ectasia    Mild CAD with normal systolic function per cath in August of 2011  . Depression   . Diaphragmatic paralysis    felt to be partially responsible for dyspnea  . Dyspnea    due to paralyzed diaphragm and pulmonary issues  . Dysrhythmia   . GERD (gastroesophageal reflux disease)   . Gout   . HTN (hypertension)   . Hypercholesteremia   . Hypertension   . Iron deficiency anemia   . Microhematuria   . Obesity   . OSA (obstructive sleep apnea)    CPAP  . Paroxysmal atrial fibrillation (HCC)    occured in the setting of acute E Coli sepsis and ileius 7/12  . PVC (premature ventricular contraction)    s/p PVC ablation 05/29/2010    Past Surgical History:  Procedure Laterality Date  . APPENDECTOMY  1978  . BIOPSY  02/09/2019   Procedure: BIOPSY;  Surgeon: Magod, Marc, MD;  Location: [facility];  Service: Endoscopy;;  . BIOPSY  03/02/2019   Procedure: BIOPSY;  Surgeon:  Magod, Marc, MD;  Location: [facility];  Service: Endoscopy;;  . CARDIAC CATHETERIZATION  04-30-2010   Left main coronary artery is normal.   . CARDIOVASCULAR STRESS TEST  03-19-2010   0%  . CHOLECYSTECTOMY  1990  . COLONOSCOPY N/A 03/02/2019   Procedure: COLONOSCOPY;  Surgeon: Magod, Marc, MD;  [facility]: [facility];  Service: Endoscopy;  Laterality: N/A;  . COLONOSCOPY W/ POLYPECTOMY    . ESOPHAGOGASTRODUODENOSCOPY (EGD) WITH PROPOFOL N/A 02/09/2019   Procedure: ESOPHAGOGASTRODUODENOSCOPY (EGD) WITH PROPOFOL;  Surgeon: Magod, Marc, MD;  [facility];  Service: Endoscopy;  Laterality: N/A;  . EYE SURGERY Right    catarct  . poly removed from nose as a child    . PVC ablation  05/29/2010  . RADIOLOGY WITH ANESTHESIA N/A 11/07/2020   Procedure: MRV HEAD WITH AND WITHOUT;  Surgeon: Radiologist, Medication, MD;  Location: [facility];  Service: Radiology;  Laterality: N/A;  . RADIOLOGY WITH ANESTHESIA N/A 11/07/2020   Procedure: CT LUMBAR SPINE WITHOUT;  Surgeon: Radiologist, Medication, MD;  Location: [facility];  Service: Radiology;  Laterality: N/A;  . US ECHOCARDIOGRAPHY  03-09-2010   EF 55-60%    There were no vitals filed for this visit.   Subjective Assessment - 12/07/20 1539    Subjective No changes since last   session, has been able to navigate second floor with cane more confidently    Patient is accompained by: Family member   wife, [name]   Pertinent History HTN, CAD, COPD, A-fib    Limitations Standing;Walking    How long can you sit comfortably? 30'    How long can you stand comfortably? 27'    How long can you walk comfortably? 58'    Patient Stated Goals wants to improve his balance    Currently in Pain? Yes    Pain Score 2     Pain Location Back    Pain Orientation Lower    Pain Descriptors / Indicators Aching    Pain Type Chronic pain    Pain Frequency Intermittent                             [facility] Adult PT Treatment/Exercise - 12/07/20 0001       Transfers   Transfers Sit to Stand    Sit to Stand 4: Min guard    Five time sit to stand comments  performed from [employer]    Comments 10x with MinA at times      Ambulation/Gait   Ambulation/Gait Yes    Ambulation/Gait Assistance 4: Min guard    Ambulation/Gait Assistance Details in clinic distances    Assistive device Straight cane    Gait Pattern Step-through pattern    Ambulation Surface Level;Indoor      Knee/Hip Exercises: Aerobic   Nustep 8' L3 arms 10(O2 sats 93% post activity)               Balance Exercises - 12/07/20 0001      Balance Exercises: Standing   Stepping Strategy Anterior;UE support;10 reps;Limitations    Stepping Strategy Limitations standing at base of stairs and placing R foot onto second step    Step Ups Forward;Lateral;4 inch;UE support 1;Limitations    Step Ups Limitations step up and over 4" block 5x ea. at 4 positions               PT Short Term Goals - 11/28/20 1455      PT SHORT TERM GOAL #1   Title Pt will initiate HEP in order to indicate improved functional mobility and decreased fall risk.  ALL STGS DUE 11/21/20    Baseline reviewed HEP on 11/28/20    Time 4    Period Weeks    Status Achieved    Target Date 11/21/20      PT SHORT TERM GOAL #2   Title Pt will perform 5x sit <> stand in 17 seconds or less with BUE support in order to demo improved functional strength.    Baseline 19.31 seconds, 10.97 seconds with BUE support on 11/28/20    Time 4    Period Weeks    Status Achieved      PT SHORT TERM GOAL #3   Title Pt will undergo BERG with STG and LTG written as appropriate.    Baseline 10/27/20 BERG 37/56    Time 4    Period Weeks    Status Achieved      PT SHORT TERM GOAL #4   Title Pt will improve gait speed with rollator to at least 2.5 ft/sec in order demo improved community mobility.    Baseline 2.18 ft/sec, 14.41 seconds = 2.27 ft/sec on 3/22    Time 4    Period Weeks    Status Not  Met      PT SHORT TERM GOAL #5    Title Pt and pt's spouse will verbalize understanding of fall prevention in the home to decr fall risk.    Time 4    Period Weeks    Status New             PT Long Term Goals - 10/27/20 1827      PT LONG TERM GOAL #1   Title Pt will be independent with final HEP in order to indicate improved functional mobility and decreased fall risk.  ALL LTGS DUE 12/19/20    Time 8    Period Weeks    Status New      PT LONG TERM GOAL #2   Title BERG goal to be written as appropriate to determine decr fall risk.  BERG goal is 45/56    Baseline 37    Time 8    Period Weeks    Status New      PT LONG TERM GOAL #3   Title Pt will perform 5x sit <> stand in 14.5 seconds or less with BUE vs. single UE support in order to demo improved functional strength.    Baseline 19.31 seconds    Time 8    Period Weeks    Status New      PT LONG TERM GOAL #4   Title Pt will ambulate at least 150' over indoor surfaces with cane vs. LRAD with supervision in order to demo improved household mobility.    Time 8    Period Weeks    Status New      PT LONG TERM GOAL #5   Title Pt will perform TUG in 14.5 seconds or less with rollator vs. LRAD in order to demo decr fall risk.    Baseline 16.94 seconds with rollator.    Time 8    Period Weeks    Status New                 Plan - 12/07/20 1543    Clinical Impression Statement Todays skilled session focused on continued need of stepping tasks to improve ability to lift R foot onto steps, balance training through stepping strategies and STS from foam base, patient struggles with compliant surfaces, he was able to place R foot onto second step when standing in front of stairs but fatigued easily, O2 sats taken throughout session found level of 93% and greater.  Extra cuing needed when tasked with step up and over strategies.  He continues to remain a good rehab candidate    Personal Factors and Comorbidities Comorbidity 3+;Past/Current Experience     Comorbidities HTN, CAD, COPD, A-fib, hx of COVID march 2021    Examination-Activity Limitations Stand;Squat;Transfers;Reach Overhead;Locomotion Level    Examination-Participation Restrictions Community Activity;Driving    Stability/Clinical Decision Making Evolving/Moderate complexity    Rehab Potential Good    PT Frequency 2x / week    PT Duration 8 weeks    PT Treatment/Interventions ADLs/Self Care Home Management;DME Instruction;Gait training;Functional mobility training;Stair training;Neuromuscular re-education;Balance training;Therapeutic exercise;Therapeutic activities;Patient/family education;Vestibular;Energy conservation    PT Next Visit Plan continue stepping tasks with focus on RLE flexion activities and placement strategies for R foot, review and update HEP as needed    Consulted and Agree with Plan of Care Patient           Patient will benefit from skilled therapeutic intervention in order to improve the following deficits and impairments:  Abnormal gait,Decreased activity   tolerance,Decreased balance,Decreased endurance,Decreased coordination,Decreased cognition,Decreased knowledge of use of DME,Decreased range of motion,Difficulty walking,Decreased safety awareness,Decreased strength  Visit Diagnosis: Other abnormalities of gait and mobility  Muscle weakness (generalized)     Problem List Patient Active Problem List   Diagnosis Date Noted  . AAA (abdominal aortic aneurysm) without rupture ([place]) 11/14/2020  . Subdural hematoma ([place]) 08/15/2020  . Iron deficiency anemia due to chronic blood loss 01/06/2019  . Orthostatic hypotension 01/05/2019    Class: Question of  . Normocytic normochromic anemia 01/05/2019  . Near syncope 01/05/2019  . Chronic respiratory failure with hypoxia ([place]) 06/30/2018  . OSA (obstructive sleep apnea) 02/11/2018  . Physical deconditioning 08/11/2017  . Shortness of breath 10/24/2014  . COPD (chronic obstructive pulmonary disease) with  emphysema ([place]) 09/01/2014  . Fatigue 10/14/2011  . Atrial fibrillation ([place]) 05/15/2011  . Premature ventricular contractions (PVCs) (VPCs) 02/25/2011  . 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  . PERSISTENT DISORDER INITIATING/MAINTAINING SLEEP 05/10/2010  . HYPERCHOLESTEROLEMIA 05/09/2010  . GOUT 05/09/2010  . OBESITY 05/09/2010  . Obstructive sleep apnea 05/09/2010  . Essential hypertension 05/09/2010  . VENTRICULAR TACHYCARDIA 05/09/2010    Lanice Shirts 12/07/2020, 4:35 PM  [facility] [address] [place], [place], [address] Phone: [phone]   Fax:  [phone]  Name: PRIDE GONZALES MRN: [id number] Date of Birth: [birth year]

## 2020-12-07 NOTE — Therapy (Signed)
[facility] [address] [place], [place], [address] Phone: [phone]   Fax:  [phone]  Speech Language Pathology Treatment  Patient Details  Name: [name] MRN: [id number] Date of Birth: February 27, 1934 Referring Provider (SLP): Margie Billet, PA-C; Dr. [name] PCP (doc)   Encounter Date: 12/07/2020   End of Session - 12/07/20 2308    Visit Number 5    Number of Visits 17    Date for SLP Re-Evaluation 01/29/21    SLP Start Time 3976    SLP Stop Time  1445    SLP Time Calculation (min) 42 min    Activity Tolerance Patient tolerated treatment well           Past Medical History:  Diagnosis Date  . Afib ([place])   . Arthritis   . BPH (benign prostatic hyperplasia)   . COPD (chronic obstructive pulmonary disease) ([place])   . Coronary artery disease   . Coronary artery ectasia    Mild CAD with normal systolic function per cath in August of 2011  . Depression   . Diaphragmatic paralysis    felt to be partially responsible for dyspnea  . Dyspnea    due to paralyzed diaphragm and pulmonary issues  . Dysrhythmia   . GERD (gastroesophageal reflux disease)   . Gout   . HTN (hypertension)   . Hypercholesteremia   . Hypertension   . Iron deficiency anemia   . Microhematuria   . Obesity   . OSA (obstructive sleep apnea)    CPAP  . Paroxysmal atrial fibrillation (HCC)    occured in the setting of acute E Coli sepsis and ileius 7/12  . PVC (premature ventricular contraction)    s/p PVC ablation 05/29/2010    Past Surgical History:  Procedure Laterality Date  . APPENDECTOMY  1978  . BIOPSY  02/09/2019   Procedure: BIOPSY;  Surgeon: Clarene Essex, MD;  [facility];  Service: Endoscopy;;  . BIOPSY  03/02/2019   Procedure: BIOPSY;  Surgeon: Clarene Essex, MD;  [facility];  Service: Endoscopy;;  . CARDIAC CATHETERIZATION  04-30-2010   Left main coronary artery is normal.   .  CARDIOVASCULAR STRESS TEST  03-19-2010   0%  . CHOLECYSTECTOMY  1990  . COLONOSCOPY N/A 03/02/2019   Procedure: COLONOSCOPY;  Surgeon: Clarene Essex, MD;  [facility];  Service: Endoscopy;  Laterality: N/A;  . COLONOSCOPY W/ POLYPECTOMY    . ESOPHAGOGASTRODUODENOSCOPY (EGD) WITH PROPOFOL N/A 02/09/2019   Procedure: ESOPHAGOGASTRODUODENOSCOPY (EGD) WITH PROPOFOL;  Surgeon: Clarene Essex, MD;  [facility];  Service: Endoscopy;  Laterality: N/A;  . EYE SURGERY Right    catarct  . poly removed from nose as a child    . PVC ablation  05/29/2010  . RADIOLOGY WITH ANESTHESIA N/A 11/07/2020   Procedure: MRV HEAD WITH AND WITHOUT;  Surgeon: Radiologist, Medication, MD;  Location: [facility];  Service: Radiology;  Laterality: N/A;  . RADIOLOGY WITH ANESTHESIA N/A 11/07/2020   Procedure: CT LUMBAR SPINE WITHOUT;  Surgeon: Radiologist, Medication, MD;  Location: [facility];  Service: Radiology;  Laterality: N/A;  . US ECHOCARDIOGRAPHY  03-09-2010   EF 55-60%    There were no vitals filed for this visit.   Subjective Assessment - 12/07/20 1406    Subjective Pt got out his homework and handed it to SLP and said, "You gave me the challenge."    Currently in Pain? No/denies  ADULT SLP TREATMENT - 12/07/20 1407      General Information   Behavior/Cognition Alert;Cooperative;Pleasant mood      Treatment Provided   Treatment provided Cognitive-Linquistic      Cognitive-Linquistic Treatment   Treatment focused on Cognition    Skilled Treatment "Some of it's kind of - kind of several of them like they were trying to trip you up." SLP had pt review his homework with SLP and pt had 40% success and req'd max A consistently with one sheet, and mod A consistently on the other sheet. SLP noted pt with decr'd alternating attention skills with writing answer in teh correct location after reading question and looking at diagram. These skills decr'd more after 25 minutes. SLP reiterated to  pt that these simple tasks would not have taken 35-40 minutes prior to December. He agreed.      Assessment / Recommendations / Plan   Plan Continue with current plan of care      Progression Toward Goals   Progression toward goals Progressing toward goals            SLP Education - 12/07/20 2307    Education Details deficit areas    Person(s) Educated Patient    Methods Explanation;Demonstration    Comprehension Verbalized understanding;Need further instruction            SLP Short Term Goals - 12/07/20 2311      SLP SHORT TERM GOAL #1   Title pt will perform functional reading and/or written tasks for 7 minutes in 2 sessions    Time 3    Period Weeks    Status On-going      SLP SHORT TERM GOAL #2   Title pt will process verbal or written directions with 80% accuracy with occasional min A x3 vistis    Baseline 12-05-20    Time 3    Period Weeks    Status On-going      SLP SHORT TERM GOAL #3   Title pt will verbally state 3 strategies to improve memory skills in 3 sessions    Time 3    Period Weeks    Status On-going      SLP SHORT TERM GOAL #4   Title pt will demo understanding of his non-physical deficits folloiwng brain bleed with modified independence in 3 sessions    Time 3    Period Weeks    Status On-going            SLP Long Term Goals - 12/07/20 2312      SLP LONG TERM GOAL #1   Title Pt will use memory compensation system for daily tasks when appropriate, x1 in 4 sessions    Time 7    Period Weeks   or 17 visits, for all LTGs   Status On-going      SLP LONG TERM GOAL #2   Title pt will demonstrate error awareness in linguistic tasks 90% of the time with nonverbal cues in 3 sessions    Time 7    Period Weeks    Status On-going      SLP LONG TERM GOAL #3   Title pt and/or wife will report pt has completed tasks demonstrating ability to solve problems in the home environment x3 sessions    Time 7    Period Weeks    Status On-going       SLP LONG TERM GOAL #4   Title pt will show knowledge of compensating for  cognitive deficits in linguistic tasks    Time 7    Period Weeks    Status On-going            Plan - 12/07/20 2309    Clinical Impression Statement [name] presents today with cognitive communication deficits in areas of attention, problem solving, processing, awareness, and memory. Awareness of deficit is slowly improving. Presence of aphasia still cannot be completely ruled out at this time - and will be monitored. Aphasia eval to take place if clinically indicated. SLP believes pt will benefit from skilled ST tageting these cognitive deficits and assist pt to regain his PLOF.    Speech Therapy Frequency 2x / week    Duration 8 weeks   or 17 total sessions   Treatment/Interventions Language facilitation;Compensatory techniques;Internal/external aids;SLP instruction and feedback;Patient/family education;Environmental controls;Cognitive reorganization    Potential to Achieve Goals Good    Consulted and Agree with Plan of Care Patient;Family member/caregiver           Patient will benefit from skilled therapeutic intervention in order to improve the following deficits and impairments:   Cognitive communication deficit    Problem List Patient Active Problem List   Diagnosis Date Noted  . AAA (abdominal aortic aneurysm) without rupture ([place]) 11/14/2020  . Subdural hematoma ([place]) 08/15/2020  . Iron deficiency anemia due to chronic blood loss 01/06/2019  . Orthostatic hypotension 01/05/2019    Class: Question of  . Normocytic normochromic anemia 01/05/2019  . Near syncope 01/05/2019  . Chronic respiratory failure with hypoxia ([place]) 06/30/2018  . OSA (obstructive sleep apnea) 02/11/2018  . Physical deconditioning 08/11/2017  . Shortness of breath 10/24/2014  . COPD (chronic obstructive pulmonary disease) with emphysema ([place]) 09/01/2014  . Fatigue 10/14/2011  . Atrial fibrillation ([place])  05/15/2011  . Premature ventricular contractions (PVCs) ([facility]) 02/25/2011  . 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  . PERSISTENT DISORDER INITIATING/MAINTAINING SLEEP 05/10/2010  . HYPERCHOLESTEROLEMIA 05/09/2010  . GOUT 05/09/2010  . OBESITY 05/09/2010  . Obstructive sleep apnea 05/09/2010  . Essential hypertension 05/09/2010  . VENTRICULAR TACHYCARDIA 05/09/2010    [facility] ,MS, CCC-SLP  12/07/2020, 11:12 PM  [facility] [address] [place], [place], [address] Phone: [phone]   Fax:  [phone]   Name: [name] MRN: [id number] Date of Birth: [birth year]

## 2020-12-07 NOTE — Therapy (Signed)
[facility] [address] [place], [place], [address] Phone: [phone]   Fax:  [phone]  Occupational Therapy Treatment  Patient Details  Name: [name] MRN: [id number] Date of Birth: 1934-04-30 Referring Provider (OT): Dr. Brett Fairy (referred by hospitalist with send to neurology)   Encounter Date: 12/07/2020   OT End of Session - 12/07/20 1522    Visit Number 8    Number of Visits 25    Date for OT Re-Evaluation 01/24/21    Authorization Type [employer] Medicare    Authorization Time Period 90 days, anticipate d/c after 8 weeks    Authorization - Visit Number 8    Authorization - Number of Visits 10    OT Start Time 3810    OT Stop Time 1530    OT Time Calculation (min) 39 min    Activity Tolerance Patient tolerated treatment well    Behavior During Therapy [facility] for tasks assessed/performed           Past Medical History:  Diagnosis Date  . Afib ([place])   . Arthritis   . BPH (benign prostatic hyperplasia)   . COPD (chronic obstructive pulmonary disease) ([place])   . Coronary artery disease   . Coronary artery ectasia    Mild CAD with normal systolic function per cath in August of 2011  . Depression   . Diaphragmatic paralysis    felt to be partially responsible for dyspnea  . Dyspnea    due to paralyzed diaphragm and pulmonary issues  . Dysrhythmia   . GERD (gastroesophageal reflux disease)   . Gout   . HTN (hypertension)   . Hypercholesteremia   . Hypertension   . Iron deficiency anemia   . Microhematuria   . Obesity   . OSA (obstructive sleep apnea)    CPAP  . Paroxysmal atrial fibrillation (HCC)    occured in the setting of acute E Coli sepsis and ileius 7/12  . PVC (premature ventricular contraction)    s/p PVC ablation 05/29/2010    Past Surgical History:  Procedure Laterality Date  . APPENDECTOMY  1978  . BIOPSY  02/09/2019   Procedure: BIOPSY;  Surgeon: Clarene Essex, MD;  Location: [facility]  END[facility];  Service: Endoscopy;;  . BIOPSY  03/02/2019   Procedure: BIOPSY;  Surgeon: Clarene Essex, MD;  [facility];  Service: Endoscopy;;  . CARDIAC CATHETERIZATION  04-30-2010   Left main coronary artery is normal.   . CARDIOVASCULAR STRESS TEST  03-19-2010   0%  . CHOLECYSTECTOMY  1990  . COLONOSCOPY N/A 03/02/2019   Procedure: COLONOSCOPY;  Surgeon: Clarene Essex, MD;  [facility];  Service: Endoscopy;  Laterality: N/A;  . COLONOSCOPY W/ POLYPECTOMY    . ESOPHAGOGASTRODUODENOSCOPY (EGD) WITH PROPOFOL N/A 02/09/2019   Procedure: ESOPHAGOGASTRODUODENOSCOPY (EGD) WITH PROPOFOL;  Surgeon: Clarene Essex, MD;  [facility];  Service: Endoscopy;  Laterality: N/A;  . EYE SURGERY Right    catarct  . poly removed from nose as a child    . PVC ablation  05/29/2010  . RADIOLOGY WITH ANESTHESIA N/A 11/07/2020   Procedure: MRV HEAD WITH AND WITHOUT;  Surgeon: Radiologist, Medication, MD;  Location: [place];  Service: Radiology;  Laterality: N/A;  . RADIOLOGY WITH ANESTHESIA N/A 11/07/2020   Procedure: CT LUMBAR SPINE WITHOUT;  Surgeon: Radiologist, Medication, MD;  Location: [place];  Service: Radiology;  Laterality: N/A;  . US ECHOCARDIOGRAPHY  03-09-2010   EF 55-60%    There were no vitals  filed for this visit.   Subjective Assessment - 12/07/20 1522    Subjective  No reports of pain    Pertinent History Pt had a fall while shopping in [employer] on 08/15/20. Pt sustained a parafalcine and tentorial SDH. Was discharged from the hospital on 08/17/20.PMH:HTN, CAD, COPD, A-fib    Patient Stated Goals improve balance and strength    Currently in Pain? No/denies              [facility] OT Assessment - 12/07/20 0001      Observation/Other Assessments   Donning Doffing Jacket Comments 3 button/ unbutton 49.25                Treatment: Pt practiced fastening buttons then was timed for 3 button/ unbutton test, pt met his short term goal. Reveiwed activities from coordination HEP,  stacking and manipulating coins, flipping and dealing cards, rotating ball, tossing ball between hands, pt returned demonstration. Level 1 putting Functional steps in order 93% accuracy, constant therapy Read a calendar and answer questions on constant therapy, mod difficulty.              OT Short Term Goals - 12/07/20 1458      OT SHORT TERM GOAL #1   Title Pt/ caregeiver will be I with HEP. 11/29/20    Time 4    Period Weeks    Status Achieved   met, for coordination HEP   Target Date 11/29/20      OT SHORT TERM GOAL #2   Title Pt / wife will verbalize understanding of strategies to increase safety and I with ADLS.    Time 4    Period Weeks    Status Achieved      OT SHORT TERM GOAL #3   Title Pt will perfrom transitional movments for ADLS with close supervision and no LOB.    Baseline histroy of falls    Time 4    Period Weeks    Status Achieved      OT SHORT TERM GOAL #4   Title Pt will demonstrate improved fine motor coordination in his LUE as eveidenced by decreasing 9-hole peg test score by 4 secs.    Baseline 45.72 LUE at eval    Time 4    Period Weeks    Status On-going   44.56s with LUE 11/30/2020     OT SHORT TERM GOAL #5   Title Pt will sequence a simple functional or ADL task  with 90% or better accuracy .    Time 4    Period Weeks    Status On-going      OT SHORT TERM GOAL #6   Title Pt will demonstrate improved fine motor coordination for ADLs as evidenced by decreasing 3 button/ unbutton time to 90 secs or less.    Time 4    Period Weeks    Status Achieved   49.25            OT Long Term Goals - 11/01/20 9169      OT LONG TERM GOAL #1   Title Pt/ wife will be I with updated HEP for proximal strength.    Time 12    Period Weeks    Status New    Target Date 01/24/21      OT LONG TERM GOAL #2   Title Pt will demonstrate ability to perform simple beverage / snack prep modifeied indpendent demonstraing good saftey awareness.    Time 12  Period Weeks    Status New      OT LONG TERM GOAL #3   Title Pt will perforrm bathing and  dressing mod I in a reasonable amout of time    Baseline increased time required    Time 12    Period Weeks    Status New      OT LONG TERM GOAL #4   Title Pt will demonstrate adequate bilateral UE strength to retrieve a lightweight object (3 lbs) from eye level  shelf without dropping with right and left UE's individually.    Time 12    Period Weeks    Status New      OT LONG TERM GOAL #5   Title Pt will increase standing functional reach test score to 10 inches or greater with RUE to minimize fall risk during ADLs.    Time 12    Period Weeks    Status New                  Patient will benefit from skilled therapeutic intervention in order to improve the following deficits and impairments:           Visit Diagnosis: Other lack of coordination  Muscle weakness (generalized)  Frontal lobe and executive function deficit  Attention and concentration deficit    Problem List Patient Active Problem List   Diagnosis Date Noted  . AAA (abdominal aortic aneurysm) without rupture ([place]) 11/14/2020  . Subdural hematoma ([place]) 08/15/2020  . Iron deficiency anemia due to chronic blood loss 01/06/2019  . Orthostatic hypotension 01/05/2019    Class: Question of  . Normocytic normochromic anemia 01/05/2019  . Near syncope 01/05/2019  . Chronic respiratory failure with hypoxia ([place]) 06/30/2018  . OSA (obstructive sleep apnea) 02/11/2018  . Physical deconditioning 08/11/2017  . Shortness of breath 10/24/2014  . COPD (chronic obstructive pulmonary disease) with emphysema ([place]) 09/01/2014  . Fatigue 10/14/2011  . Atrial fibrillation ([place]) 05/15/2011  . Premature ventricular contractions (PVCs) ([facility]) 02/25/2011  . 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  . PERSISTENT DISORDER INITIATING/MAINTAINING SLEEP 05/10/2010  . HYPERCHOLESTEROLEMIA 05/09/2010  . GOUT 05/09/2010  . OBESITY  05/09/2010  . Obstructive sleep apnea 05/09/2010  . Essential hypertension 05/09/2010  . VENTRICULAR TACHYCARDIA 05/09/2010    Glenetta Kiger 12/07/2020, 3:23 PM  [facility] [address] [place], [place], [address] Phone: [phone]   Fax:  [phone]  Name: MUHAMMAD VACCA MRN: [id number] Date of Birth: 01/11/1934

## 2020-12-07 NOTE — Patient Instructions (Signed)
  Please complete the assigned speech therapy homework prior to your next session and return it to the speech therapist at your next visit.  

## 2020-12-12 ENCOUNTER — Ambulatory Visit: Payer: Medicare Other

## 2020-12-12 ENCOUNTER — Ambulatory Visit: Payer: Medicare Other | Attending: Physician Assistant

## 2020-12-12 ENCOUNTER — Encounter: Payer: Self-pay | Admitting: Occupational Therapy

## 2020-12-12 ENCOUNTER — Ambulatory Visit: Payer: Medicare Other | Admitting: Occupational Therapy

## 2020-12-12 ENCOUNTER — Other Ambulatory Visit: Payer: Self-pay

## 2020-12-12 DIAGNOSIS — R2689 Other abnormalities of gait and mobility: Secondary | ICD-10-CM | POA: Diagnosis present

## 2020-12-12 DIAGNOSIS — R2681 Unsteadiness on feet: Secondary | ICD-10-CM | POA: Diagnosis not present

## 2020-12-12 DIAGNOSIS — R41844 Frontal lobe and executive function deficit: Secondary | ICD-10-CM

## 2020-12-12 DIAGNOSIS — M6281 Muscle weakness (generalized): Secondary | ICD-10-CM | POA: Diagnosis present

## 2020-12-12 DIAGNOSIS — R278 Other lack of coordination: Secondary | ICD-10-CM | POA: Insufficient documentation

## 2020-12-12 DIAGNOSIS — R41840 Attention and concentration deficit: Secondary | ICD-10-CM

## 2020-12-12 DIAGNOSIS — R41841 Cognitive communication deficit: Secondary | ICD-10-CM | POA: Diagnosis present

## 2020-12-12 NOTE — Therapy (Signed)
[facility] [address] [place], [place], [address] Phone: [phone]   Fax:  [phone]  Speech Language Pathology Treatment  Patient Details  Name: [name] MRN: [id number] Date of Birth: [birth year] Referring Provider (SLP): Margie Billet, PA-C; Dr. [name] PCP (doc)   Encounter Date: 12/12/2020   End of Session - 12/12/20 1710    Visit Number 6    Number of Visits 17    Date for SLP Re-Evaluation 01/29/21    SLP Start Time 1448    SLP Stop Time  1530    SLP Time Calculation (min) 42 min    Activity Tolerance Patient tolerated treatment well           Past Medical History:  Diagnosis Date  . Afib ([place])   . Arthritis   . BPH (benign prostatic hyperplasia)   . COPD (chronic obstructive pulmonary disease) ([place])   . Coronary artery disease   . Coronary artery ectasia    Mild CAD with normal systolic function per cath in August of 2011  . Depression   . Diaphragmatic paralysis    felt to be partially responsible for dyspnea  . Dyspnea    due to paralyzed diaphragm and pulmonary issues  . Dysrhythmia   . GERD (gastroesophageal reflux disease)   . Gout   . HTN (hypertension)   . Hypercholesteremia   . Hypertension   . Iron deficiency anemia   . Microhematuria   . Obesity   . OSA (obstructive sleep apnea)    CPAP  . Paroxysmal atrial fibrillation (HCC)    occured in the setting of acute E Coli sepsis and ileius 7/12  . PVC (premature ventricular contraction)    s/p PVC ablation 05/29/2010    Past Surgical History:  Procedure Laterality Date  . APPENDECTOMY  1978  . BIOPSY  02/09/2019   Procedure: BIOPSY;  Surgeon: Clarene Essex, MD;  [facility];  Service: Endoscopy;;  . BIOPSY  03/02/2019   Procedure: BIOPSY;  Surgeon: Clarene Essex, MD;  [facility];  Service: Endoscopy;;  . CARDIAC CATHETERIZATION  04-30-2010   Left main coronary artery is normal.   .  CARDIOVASCULAR STRESS TEST  03-19-2010   0%  . CHOLECYSTECTOMY  1990  . COLONOSCOPY N/A 03/02/2019   Procedure: COLONOSCOPY;  Surgeon: Clarene Essex, MD;  [facility];  Service: Endoscopy;  Laterality: N/A;  . COLONOSCOPY W/ POLYPECTOMY    . ESOPHAGOGASTRODUODENOSCOPY (EGD) WITH PROPOFOL N/A 02/09/2019   Procedure: ESOPHAGOGASTRODUODENOSCOPY (EGD) WITH PROPOFOL;  Surgeon: Clarene Essex, MD;  [facility];  Service: Endoscopy;  Laterality: N/A;  . EYE SURGERY Right    catarct  . poly removed from nose as a child    . PVC ablation  05/29/2010  . RADIOLOGY WITH ANESTHESIA N/A 11/07/2020   Procedure: MRV HEAD WITH AND WITHOUT;  Surgeon: Radiologist, Medication, MD;  Location: [facility];  Service: Radiology;  Laterality: N/A;  . RADIOLOGY WITH ANESTHESIA N/A 11/07/2020   Procedure: CT LUMBAR SPINE WITHOUT;  Surgeon: Radiologist, Medication, MD;  Location: [place];  Service: Radiology;  Laterality: N/A;  . US ECHOCARDIOGRAPHY  03-09-2010   EF 55-60%    There were no vitals filed for this visit.   Subjective Assessment - 12/12/20 1519    Subjective "It's the 5th - - of April. In 5 more days it will be my birthday."    Currently in Pain? No/denies  ADULT SLP TREATMENT - 12/12/20 1520      General Information   Behavior/Cognition Alert;Cooperative;Pleasant mood      Treatment Provided   Treatment provided Cognitive-Linquistic      Cognitive-Linquistic Treatment   Treatment focused on Cognition    Skilled Treatment SLP talked to [name]'s wife and wife stated pt is very sedentary during the day. asked pt to correct his homework - 60% success (3/5). Pt corrected with SLP cues to which ones were incorrect. Pt corrected them on first attempt for 5/5 success. Due to wife's statement of pt's sedentary nature SLP asked [name] and found that he was doing his PT exercises twice a week. Pt thought of 2 ramifications of not doing exercises and needed SLP mod-max  cues to think of  two more. SLP asked pt what he could do to ensure he got exercises in - pt stated he could set alarm for 10am.      Assessment / Recommendations / Plan   Plan Continue with current plan of care      Progression Toward Goals   Progression toward goals Progressing toward goals            SLP Education - 12/12/20 1710    Education Details setting alarm for PT exercises might be helpful to recall to complete them    Person(s) Educated Patient    Methods Explanation    Comprehension Verbalized understanding            SLP Short Term Goals - 12/12/20 1711      SLP SHORT TERM GOAL #1   Title pt will perform functional reading and/or written tasks for 7 minutes in 2 sessions    Time 2    Period Weeks    Status On-going      SLP SHORT TERM GOAL #2   Title pt will process verbal or written directions with 80% accuracy with occasional min A x3 vistis    Baseline 12-05-20, 12-12-20    Time 2    Period Weeks    Status On-going      SLP SHORT TERM GOAL #3   Title pt will verbally state 3 strategies to improve memory skills in 3 sessions    Baseline 12-12-20 (phone alarm)    Time 2    Period Weeks    Status On-going      SLP SHORT TERM GOAL #4   Title pt will demo understanding of his non-physical deficits folloiwng brain bleed with modified independence in 3 sessions    Baseline 12-12-20    Time 2    Period Weeks    Status On-going            SLP Long Term Goals - 12/12/20 1712      SLP LONG TERM GOAL #1   Title Pt will use memory compensation system for daily tasks when appropriate, x1 in 4 sessions    Time 6    Period Weeks   or 17 visits, for all LTGs   Status On-going      SLP LONG TERM GOAL #2   Title pt will demonstrate error awareness in linguistic tasks 90% of the time with nonverbal cues in 3 sessions    Time 6    Period Weeks    Status On-going      SLP LONG TERM GOAL #3   Title pt and/or wife will report pt has completed tasks demonstrating ability to solve  problems in the home environment x3 sessions  Time 6    Period Weeks    Status On-going      SLP LONG TERM GOAL #4   Title pt will show knowledge of compensating for cognitive deficits in linguistic tasks    Time 6    Period Weeks    Status On-going            Plan - 12/12/20 1711    Clinical Impression Statement Laymond "[name]" Torpey presents today with cognitive communication deficits in areas of attention, problem solving, processing, awareness, and memory. Awareness of deficit is slowly improving. Presence of aphasia still cannot be completely ruled out at this time - and will be monitored. Aphasia eval to take place if clinically indicated. SLP believes pt will benefit from skilled ST tageting these cognitive deficits and assist pt to regain his PLOF.    Speech Therapy Frequency 2x / week    Duration 8 weeks   or 17 total sessions   Treatment/Interventions Language facilitation;Compensatory techniques;Internal/external aids;SLP instruction and feedback;Patient/family education;Environmental controls;Cognitive reorganization    Potential to Achieve Goals Good    Consulted and Agree with Plan of Care Patient;Family member/caregiver           Patient will benefit from skilled therapeutic intervention in order to improve the following deficits and impairments:   Cognitive communication deficit    Problem List Patient Active Problem List   Diagnosis Date Noted  . AAA (abdominal aortic aneurysm) without rupture ([place]) 11/14/2020  . Subdural hematoma ([place]) 08/15/2020  . Iron deficiency anemia due to chronic blood loss 01/06/2019  . Orthostatic hypotension 01/05/2019    Class: Question of  . Normocytic normochromic anemia 01/05/2019  . Near syncope 01/05/2019  . Chronic respiratory failure with hypoxia ([place]) 06/30/2018  . OSA (obstructive sleep apnea) 02/11/2018  . Physical deconditioning 08/11/2017  . Shortness of breath 10/24/2014  . COPD (chronic obstructive pulmonary  disease) with emphysema ([place]) 09/01/2014  . Fatigue 10/14/2011  . Atrial fibrillation ([place]) 05/15/2011  . Premature ventricular contractions (PVCs) ([facility]) 02/25/2011  . 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  . PERSISTENT DISORDER INITIATING/MAINTAINING SLEEP 05/10/2010  . HYPERCHOLESTEROLEMIA 05/09/2010  . GOUT 05/09/2010  . OBESITY 05/09/2010  . Obstructive sleep apnea 05/09/2010  . Essential hypertension 05/09/2010  . VENTRICULAR TACHYCARDIA 05/09/2010    [facility] ,MS, CCC-SLP  12/12/2020, 5:13 PM  [facility] [address] [place], [place], [address] Phone: [phone]   Fax:  [phone]   Name: DERRIS MILLAN MRN: [id number] Date of Birth: 05-Jan-1934

## 2020-12-12 NOTE — Progress Notes (Signed)
Dear [name]:   Subdural hematoma on 08-15-2020, admitted to [facility] .  Had 2 more ED encounters right after , on 08-16-2020 and 08-17-2020- this must have been an observation stay , not admission.  Since then increased fall risk, unsteadiness in gait and weakness in legs.   Hope that helps.Larey Seat, MD

## 2020-12-12 NOTE — Therapy (Signed)
[facility] [address] [place], [place], [address] Phone: [phone]   Fax:  [phone]  Physical Therapy Treatment  Patient Details  Name: [name] MRN: [id number] Date of Birth: 1934/08/14 Referring Provider (PT): Dohmeier, Asencion Partridge, MD (is being followed by, was referred by hospitalist)   Encounter Date: 12/12/2020   PT End of Session - 12/12/20 1620    Visit Number 12    Number of Visits 17    Date for PT Re-Evaluation 01/22/21    Authorization Type UHC Medicare    PT Start Time 1324    PT Stop Time 1615    PT Time Calculation (min) 45 min    Equipment Utilized During Treatment Gait belt    Activity Tolerance Patient tolerated treatment well    Behavior During Therapy [facility] for tasks assessed/performed           Past Medical History:  Diagnosis Date  . Afib ([place])   . Arthritis   . BPH (benign prostatic hyperplasia)   . COPD (chronic obstructive pulmonary disease) ([place])   . Coronary artery disease   . Coronary artery ectasia    Mild CAD with normal systolic function per cath in August of 2011  . Depression   . Diaphragmatic paralysis    felt to be partially responsible for dyspnea  . Dyspnea    due to paralyzed diaphragm and pulmonary issues  . Dysrhythmia   . GERD (gastroesophageal reflux disease)   . Gout   . HTN (hypertension)   . Hypercholesteremia   . Hypertension   . Iron deficiency anemia   . Microhematuria   . Obesity   . OSA (obstructive sleep apnea)    CPAP  . Paroxysmal atrial fibrillation (HCC)    occured in the setting of acute E Coli sepsis and ileius 7/12  . PVC (premature ventricular contraction)    s/p PVC ablation 05/29/2010    Past Surgical History:  Procedure Laterality Date  . APPENDECTOMY  1978  . BIOPSY  02/09/2019   Procedure: BIOPSY;  Surgeon: Clarene Essex, MD;  Location: [facility];  Service: Endoscopy;;  . BIOPSY  03/02/2019   Procedure: BIOPSY;  Surgeon:  Clarene Essex, MD;  [facility];  Service: Endoscopy;;  . CARDIAC CATHETERIZATION  04-30-2010   Left main coronary artery is normal.   . CARDIOVASCULAR STRESS TEST  03-19-2010   0%  . CHOLECYSTECTOMY  1990  . COLONOSCOPY N/A 03/02/2019   Procedure: COLONOSCOPY;  Surgeon: Clarene Essex, MD;  [facility];  Service: Endoscopy;  Laterality: N/A;  . COLONOSCOPY W/ POLYPECTOMY    . ESOPHAGOGASTRODUODENOSCOPY (EGD) WITH PROPOFOL N/A 02/09/2019   Procedure: ESOPHAGOGASTRODUODENOSCOPY (EGD) WITH PROPOFOL;  Surgeon: Clarene Essex, MD;  [facility];  Service: Endoscopy;  Laterality: N/A;  . EYE SURGERY Right    catarct  . poly removed from nose as a child    . PVC ablation  05/29/2010  . RADIOLOGY WITH ANESTHESIA N/A 11/07/2020   Procedure: MRV HEAD WITH AND WITHOUT;  Surgeon: Radiologist, Medication, MD;  Location: [facility];  Service: Radiology;  Laterality: N/A;  . RADIOLOGY WITH ANESTHESIA N/A 11/07/2020   Procedure: CT LUMBAR SPINE WITHOUT;  Surgeon: Radiologist, Medication, MD;  Location: [facility];  Service: Radiology;  Laterality: N/A;  . US ECHOCARDIOGRAPHY  03-09-2010   EF 55-60%    There were no vitals filed for this visit.   Subjective Assessment - 12/12/20 1618    Subjective Feel she is improving  in mobility and balance, able to flex R hip higher, no falls or LOB to note    Patient is accompained by: Family member   wife, [name]   Pertinent History HTN, CAD, COPD, A-fib    Limitations Standing;Walking    How long can you sit comfortably? 30'    How long can you stand comfortably? 76'    How long can you walk comfortably? 60'    Patient Stated Goals wants to improve his balance    Currently in Pain? Yes    Pain Score 2     Pain Location Back    Pain Orientation Lower    Pain Descriptors / Indicators Aching    Pain Type Chronic pain    Pain Frequency Intermittent                             [facility] Adult PT Treatment/Exercise - 12/12/20 0001       Transfers   Transfers Sit to Stand    Sit to Stand 4: Min guard    Five time sit to stand comments  from [employer]    Comments 2x5 with OH reach      Ambulation/Gait   Ambulation/Gait Yes    Ambulation/Gait Assistance 4: Min guard    Ambulation/Gait Assistance Details outside ambulation    Ambulation Distance (Feet) 500 Feet    Assistive device Rollator    Gait Pattern Step-through pattern    Ambulation Surface Level;Unlevel;Indoor;Outdoor               Balance Exercises - 12/12/20 0001      Balance Exercises: Standing   Stepping Strategy Anterior;UE support;10 reps;Limitations    Stepping Strategy Limitations runner step from 4" block 10x /leg    Step Ups Forward;Intermittent UE support;Limitations    Step Ups Limitations step onto rocker board 10x per leg               PT Short Term Goals - 11/28/20 1455      PT SHORT TERM GOAL #1   Title Pt will initiate HEP in order to indicate improved functional mobility and decreased fall risk.  ALL STGS DUE 11/21/20    Baseline reviewed HEP on 11/28/20    Time 4    Period Weeks    Status Achieved    Target Date 11/21/20      PT SHORT TERM GOAL #2   Title Pt will perform 5x sit <> stand in 17 seconds or less with BUE support in order to demo improved functional strength.    Baseline 19.31 seconds, 10.97 seconds with BUE support on 11/28/20    Time 4    Period Weeks    Status Achieved      PT SHORT TERM GOAL #3   Title Pt will undergo BERG with STG and LTG written as appropriate.    Baseline 10/27/20 BERG 37/56    Time 4    Period Weeks    Status Achieved      PT SHORT TERM GOAL #4   Title Pt will improve gait speed with rollator to at least 2.5 ft/sec in order demo improved community mobility.    Baseline 2.18 ft/sec, 14.41 seconds = 2.27 ft/sec on 3/22    Time 4    Period Weeks    Status Not Met      PT SHORT TERM GOAL #5   Title Pt and pt's spouse will verbalize understanding  of fall prevention in the home to decr  fall risk.    Time 4    Period Weeks    Status New             PT Long Term Goals - 10/27/20 1827      PT LONG TERM GOAL #1   Title Pt will be independent with final HEP in order to indicate improved functional mobility and decreased fall risk.  ALL LTGS DUE 12/19/20    Time 8    Period Weeks    Status New      PT LONG TERM GOAL #2   Title BERG goal to be written as appropriate to determine decr fall risk.  BERG goal is 45/56    Baseline 37    Time 8    Period Weeks    Status New      PT LONG TERM GOAL #3   Title Pt will perform 5x sit <> stand in 14.5 seconds or less with BUE vs. single UE support in order to demo improved functional strength.    Baseline 19.31 seconds    Time 8    Period Weeks    Status New      PT LONG TERM GOAL #4   Title Pt will ambulate at least 150' over indoor surfaces with cane vs. LRAD with supervision in order to demo improved household mobility.    Time 8    Period Weeks    Status New      PT LONG TERM GOAL #5   Title Pt will perform TUG in 14.5 seconds or less with rollator vs. LRAD in order to demo decr fall risk.    Baseline 16.94 seconds with rollator.    Time 8    Period Weeks    Status New                 Plan - 12/12/20 1621    Clinical Impression Statement todays skilled session focused on outside ambulation wth rollator across paved and unlevel surfaces, continued transfer training from compliant surfaces, stepping and high marching in // bars.  Patient showing improved ability to march and flex R hip to pelvic levels    Personal Factors and Comorbidities Comorbidity 3+;Past/Current Experience    Comorbidities HTN, CAD, COPD, A-fib, hx of COVID march 2021    Examination-Activity Limitations Stand;Squat;Transfers;Reach Overhead;Locomotion Level    Examination-Participation Restrictions Community Activity;Driving    Stability/Clinical Decision Making Evolving/Moderate complexity    Rehab Potential Good    PT Frequency 2x  / week    PT Duration 8 weeks    PT Treatment/Interventions ADLs/Self Care Home Management;DME Instruction;Gait training;Functional mobility training;Stair training;Neuromuscular re-education;Balance training;Therapeutic exercise;Therapeutic activities;Patient/family education;Vestibular;Energy conservation    PT Next Visit Plan continue stepping tasks with focus on RLE flexion activities and placement strategies for R foot, review and update HEP as needed    Consulted and Agree with Plan of Care Patient           Patient will benefit from skilled therapeutic intervention in order to improve the following deficits and impairments:  Abnormal gait,Decreased activity tolerance,Decreased balance,Decreased endurance,Decreased coordination,Decreased cognition,Decreased knowledge of use of DME,Decreased range of motion,Difficulty walking,Decreased safety awareness,Decreased strength  Visit Diagnosis: Unsteadiness on feet  Muscle weakness (generalized)  Other lack of coordination     Problem List Patient Active Problem List   Diagnosis Date Noted  . AAA (abdominal aortic aneurysm) without rupture ([place]) 11/14/2020  . Subdural hematoma ([place]) 08/15/2020  .  Iron deficiency anemia due to chronic blood loss 01/06/2019  . Orthostatic hypotension 01/05/2019    Class: Question of  . Normocytic normochromic anemia 01/05/2019  . Near syncope 01/05/2019  . Chronic respiratory failure with hypoxia ([place]) 06/30/2018  . OSA (obstructive sleep apnea) 02/11/2018  . Physical deconditioning 08/11/2017  . Shortness of breath 10/24/2014  . COPD (chronic obstructive pulmonary disease) with emphysema ([place]) 09/01/2014  . Fatigue 10/14/2011  . Atrial fibrillation ([place]) 05/15/2011  . Premature ventricular contractions (PVCs) (VPCs) 02/25/2011  . 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  . PERSISTENT DISORDER INITIATING/MAINTAINING SLEEP 05/10/2010  . HYPERCHOLESTEROLEMIA 05/09/2010  . GOUT 05/09/2010  . OBESITY  05/09/2010  . Obstructive sleep apnea 05/09/2010  . Essential hypertension 05/09/2010  . VENTRICULAR TACHYCARDIA 05/09/2010    Lanice Shirts 12/12/2020, 5:38 PM  [facility] [address] [place], [place], [address] Phone: [phone]   Fax:  [phone]  Name: [name] MRN: [id number] Date of Birth: [birth year]

## 2020-12-12 NOTE — Therapy (Signed)
[facility] [address] [place], [place], [address] Phone: [phone]   Fax:  [phone]  Occupational Therapy Treatment  Patient Details  Name: [name] MRN: [id number] Date of Birth: 1934-06-18 Referring Provider (OT): Dr. Brett Fairy (referred by hospitalist with send to neurology)   Encounter Date: 12/12/2020   OT End of Session - 12/12/20 1614    Visit Number 9    Number of Visits 25    Date for OT Re-Evaluation 01/24/21    Authorization Type UHC Medicare    Authorization Time Period 90 days, anticipate d/c after 8 weeks    Authorization - Visit Number 9    Authorization - Number of Visits 10    OT Start Time 6962    OT Stop Time 1700    OT Time Calculation (min) 46 min    Activity Tolerance Patient tolerated treatment well    Behavior During Therapy [facility] for tasks assessed/performed           Past Medical History:  Diagnosis Date  . Afib ([place])   . Arthritis   . BPH (benign prostatic hyperplasia)   . COPD (chronic obstructive pulmonary disease) ([place])   . Coronary artery disease   . Coronary artery ectasia    Mild CAD with normal systolic function per cath in August of 2011  . Depression   . Diaphragmatic paralysis    felt to be partially responsible for dyspnea  . Dyspnea    due to paralyzed diaphragm and pulmonary issues  . Dysrhythmia   . GERD (gastroesophageal reflux disease)   . Gout   . HTN (hypertension)   . Hypercholesteremia   . Hypertension   . Iron deficiency anemia   . Microhematuria   . Obesity   . OSA (obstructive sleep apnea)    CPAP  . Paroxysmal atrial fibrillation (HCC)    occured in the setting of acute E Coli sepsis and ileius 7/12  . PVC (premature ventricular contraction)    s/p PVC ablation 05/29/2010    Past Surgical History:  Procedure Laterality Date  . APPENDECTOMY  1978  . BIOPSY  02/09/2019   Procedure: BIOPSY;  Surgeon: Clarene Essex, MD;  [facility];  Service: Endoscopy;;  . BIOPSY  03/02/2019   Procedure: BIOPSY;  Surgeon: Clarene Essex, MD;  [facility];  Service: Endoscopy;;  . CARDIAC CATHETERIZATION  04-30-2010   Left main coronary artery is normal.   . CARDIOVASCULAR STRESS TEST  03-19-2010   0%  . CHOLECYSTECTOMY  1990  . COLONOSCOPY N/A 03/02/2019   Procedure: COLONOSCOPY;  Surgeon: Clarene Essex, MD;  [facility];  Service: Endoscopy;  Laterality: N/A;  . COLONOSCOPY W/ POLYPECTOMY    . ESOPHAGOGASTRODUODENOSCOPY (EGD) WITH PROPOFOL N/A 02/09/2019   Procedure: ESOPHAGOGASTRODUODENOSCOPY (EGD) WITH PROPOFOL;  Surgeon: Clarene Essex, MD;  [facility];  Service: Endoscopy;  Laterality: N/A;  . EYE SURGERY Right    catarct  . poly removed from nose as a child    . PVC ablation  05/29/2010  . RADIOLOGY WITH ANESTHESIA N/A 11/07/2020   Procedure: MRV HEAD WITH AND WITHOUT;  Surgeon: Radiologist, Medication, MD;  Location: [facility];  Service: Radiology;  Laterality: N/A;  . RADIOLOGY WITH ANESTHESIA N/A 11/07/2020   Procedure: CT LUMBAR SPINE WITHOUT;  Surgeon: Radiologist, Medication, MD;  Location: [facility];  Service: Radiology;  Laterality: N/A;  . US ECHOCARDIOGRAPHY  03-09-2010   EF 55-60%    There were no vitals  filed for this visit.   Subjective Assessment - 12/12/20 1614    Subjective  Pt denies any pain. Pt reports "my memory" when asked what is still challenging and adding and subtracting.    Pertinent History Pt had a fall while shopping in [employer] on 08/15/20. Pt sustained a parafalcine and tentorial SDH. Was discharged from the hospital on 08/17/20.PMH:HTN, CAD, COPD, A-fib    Patient Stated Goals improve balance and strength    Currently in Pain? No/denies            TREATMENT:  Fine Motor Coordination:  With LUE placing small beads on golf tees x 10. Tremors noted in LUE during purposeful activities. Min to moderate drops and difficulty with coordination.   Alphabetizing Words on Constant  Therapy: level 3 with 85% accuracy and 57.35s response time. Pt required increased time and mod cues for sequencing and attending to all the words.  Alternating Symbols on Constant Therapy: level 4 with 98% accuracy and 71.12s response time.                       OT Short Term Goals - 12/07/20 1527      OT SHORT TERM GOAL #1   Title Pt/ caregeiver will be I with HEP. 11/29/20    Time 4    Period Weeks    Status Achieved   met, for coordination HEP   Target Date 11/29/20      OT SHORT TERM GOAL #2   Title Pt / wife will verbalize understanding of strategies to increase safety and I with ADLS.    Time 4    Period Weeks    Status Achieved      OT SHORT TERM GOAL #3   Title Pt will perfrom transitional movments for ADLS with close supervision and no LOB.    Baseline histroy of falls    Time 4    Period Weeks    Status Achieved      OT SHORT TERM GOAL #4   Title Pt will demonstrate improved fine motor coordination in his LUE as eveidenced by decreasing 9-hole peg test score by 4 secs.    Baseline 45.72 LUE at eval    Time 4    Period Weeks    Status On-going   44.56s with LUE 11/30/2020     OT SHORT TERM GOAL #5   Title Pt will sequence a simple functional or ADL task  with 90% or better accuracy .    Time 4    Period Weeks    Status Achieved   93% on constant therapy     OT SHORT TERM GOAL #6   Title Pt will demonstrate improved fine motor coordination for ADLs as evidenced by decreasing 3 button/ unbutton time to 90 secs or less.    Time 4    Period Weeks    Status Achieved   49.25            OT Long Term Goals - 11/01/20 1610      OT LONG TERM GOAL #1   Title Pt/ wife will be I with updated HEP for proximal strength.    Time 12    Period Weeks    Status New    Target Date 01/24/21      OT LONG TERM GOAL #2   Title Pt will demonstrate ability to perform simple beverage / snack prep modifeied indpendent demonstraing good saftey awareness.  Time 12    Period Weeks    Status New      OT LONG TERM GOAL #3   Title Pt will perforrm bathing and  dressing mod I in a reasonable amout of time    Baseline increased time required    Time 12    Period Weeks    Status New      OT LONG TERM GOAL #4   Title Pt will demonstrate adequate bilateral UE strength to retrieve a lightweight object (3 lbs) from eye level  shelf without dropping with right and left UE's individually.    Time 12    Period Weeks    Status New      OT LONG TERM GOAL #5   Title Pt will increase standing functional reach test score to 10 inches or greater with RUE to minimize fall risk during ADLs.    Time 12    Period Weeks    Status New                  Patient will benefit from skilled therapeutic intervention in order to improve the following deficits and impairments:           Visit Diagnosis: Unsteadiness on feet  Other lack of coordination  Muscle weakness (generalized)  Frontal lobe and executive function deficit  Attention and concentration deficit  Other abnormalities of gait and mobility    Problem List Patient Active Problem List   Diagnosis Date Noted  . AAA (abdominal aortic aneurysm) without rupture ([place]) 11/14/2020  . Subdural hematoma ([place]) 08/15/2020  . Iron deficiency anemia due to chronic blood loss 01/06/2019  . Orthostatic hypotension 01/05/2019    Class: Question of  . Normocytic normochromic anemia 01/05/2019  . Near syncope 01/05/2019  . Chronic respiratory failure with hypoxia ([place]) 06/30/2018  . OSA (obstructive sleep apnea) 02/11/2018  . Physical deconditioning 08/11/2017  . Shortness of breath 10/24/2014  . COPD (chronic obstructive pulmonary disease) with emphysema ([place]) 09/01/2014  . Fatigue 10/14/2011  . Atrial fibrillation ([place]) 05/15/2011  . Premature ventricular contractions (PVCs) ([facility]) 02/25/2011  . 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  . PERSISTENT DISORDER INITIATING/MAINTAINING SLEEP  05/10/2010  . HYPERCHOLESTEROLEMIA 05/09/2010  . GOUT 05/09/2010  . OBESITY 05/09/2010  . Obstructive sleep apnea 05/09/2010  . Essential hypertension 05/09/2010  . VENTRICULAR TACHYCARDIA 05/09/2010    Zachery Conch 12/12/2020, 4:17 PM  [facility] [address] [place], [place], [address] Phone: [phone]   Fax:  [phone]  Name: [name] MRN: [id number] Date of Birth: 02/06/1934

## 2020-12-14 ENCOUNTER — Ambulatory Visit: Payer: Medicare Other

## 2020-12-14 ENCOUNTER — Other Ambulatory Visit: Payer: Self-pay

## 2020-12-14 ENCOUNTER — Ambulatory Visit: Payer: Medicare Other | Admitting: Occupational Therapy

## 2020-12-14 ENCOUNTER — Encounter: Payer: Self-pay | Admitting: Occupational Therapy

## 2020-12-14 DIAGNOSIS — R2681 Unsteadiness on feet: Secondary | ICD-10-CM | POA: Diagnosis not present

## 2020-12-14 DIAGNOSIS — R2689 Other abnormalities of gait and mobility: Secondary | ICD-10-CM

## 2020-12-14 DIAGNOSIS — R41844 Frontal lobe and executive function deficit: Secondary | ICD-10-CM

## 2020-12-14 DIAGNOSIS — M6281 Muscle weakness (generalized): Secondary | ICD-10-CM

## 2020-12-14 DIAGNOSIS — R41840 Attention and concentration deficit: Secondary | ICD-10-CM

## 2020-12-14 DIAGNOSIS — R278 Other lack of coordination: Secondary | ICD-10-CM

## 2020-12-14 NOTE — Therapy (Signed)
[facility] [address] [place], [place], [address] Phone: [phone]   Fax:  [phone]  Occupational Therapy Treatment & PN/Recertification  Patient Details  Name: [name] MRN: [id number] Date of Birth: [birth year] Referring Provider (OT): Dr. Brett Fairy (referred by hospitalist with send to neurology)   Encounter Date: 12/14/2020   OT End of Session - 12/14/20 1618    Visit Number 10    Number of Visits 25    Date for OT Re-Evaluation 01/24/21    Authorization Type UHC Medicare    Authorization Time Period 90 days, anticipate d/c after 8 weeks . Week 5 (12/14/20)    Authorization - Visit Number 10    Authorization - Number of Visits 20    OT Start Time 8563    OT Stop Time 1657    OT Time Calculation (min) 39 min    Activity Tolerance Patient tolerated treatment well    Behavior During Therapy WFL for tasks assessed/performed           Past Medical History:  Diagnosis Date  . Afib ([place])   . Arthritis   . BPH (benign prostatic hyperplasia)   . COPD (chronic obstructive pulmonary disease) ([place])   . Coronary artery disease   . Coronary artery ectasia    Mild CAD with normal systolic function per cath in August of 2011  . Depression   . Diaphragmatic paralysis    felt to be partially responsible for dyspnea  . Dyspnea    due to paralyzed diaphragm and pulmonary issues  . Dysrhythmia   . GERD (gastroesophageal reflux disease)   . Gout   . HTN (hypertension)   . Hypercholesteremia   . Hypertension   . Iron deficiency anemia   . Microhematuria   . Obesity   . OSA (obstructive sleep apnea)    CPAP  . Paroxysmal atrial fibrillation (HCC)    occured in the setting of acute E Coli sepsis and ileius 7/12  . PVC (premature ventricular contraction)    s/p PVC ablation 05/29/2010    Past Surgical History:  Procedure Laterality Date  . APPENDECTOMY  1978  . BIOPSY  02/09/2019   Procedure: BIOPSY;   Surgeon: Clarene Essex, MD;  Location: [facility];  Service: Endoscopy;;  . BIOPSY  03/02/2019   Procedure: BIOPSY;  Surgeon: Clarene Essex, MD;  Location: [facility];  Service: Endoscopy;;  . CARDIAC CATHETERIZATION  04-30-2010   Left main coronary artery is normal.   . CARDIOVASCULAR STRESS TEST  03-19-2010   0%  . CHOLECYSTECTOMY  1990  . COLONOSCOPY N/A 03/02/2019   Procedure: COLONOSCOPY;  Surgeon: Clarene Essex, MD;  [facility]: [facility];  Service: Endoscopy;  Laterality: N/A;  . COLONOSCOPY W/ POLYPECTOMY    . ESOPHAGOGASTRODUODENOSCOPY (EGD) WITH PROPOFOL N/A 02/09/2019   Procedure: ESOPHAGOGASTRODUODENOSCOPY (EGD) WITH PROPOFOL;  Surgeon: Clarene Essex, MD;  [facility];  Service: Endoscopy;  Laterality: N/A;  . EYE SURGERY Right    catarct  . poly removed from nose as a child    . PVC ablation  05/29/2010  . RADIOLOGY WITH ANESTHESIA N/A 11/07/2020   Procedure: MRV HEAD WITH AND WITHOUT;  Surgeon: Radiologist, Medication, MD;  Location: [facility];  Service: Radiology;  Laterality: N/A;  . RADIOLOGY WITH ANESTHESIA N/A 11/07/2020   Procedure: CT LUMBAR SPINE WITHOUT;  Surgeon: Radiologist, Medication, MD;  Location: [facility];  Service: Radiology;  Laterality: N/A;  . US ECHOCARDIOGRAPHY  03-09-2010   EF 55-60%  There were no vitals filed for this visit.   Subjective Assessment - 12/14/20 1619    Subjective  Pt denies any pain. "I would like to keep working on my left arm and the shaking and my brain is just scattered from the fall"    Pertinent History Pt had a fall while shopping in [employer] on 08/15/20. Pt sustained a parafalcine and tentorial SDH. Was discharged from the hospital on 08/17/20.PMH:HTN, CAD, COPD, A-fib    Patient Stated Goals improve balance and strength    Currently in Pain? No/denies              TREATMENT:  Checked goals and updated with new goals and revisions.  Grooved Pegs: with LUE with increased time and using body for rotating pegs at  times.  Constant Therapy Identifying word that does not belong level 2 with 98% accuracy and 16.66s response time. Everyday Math  level 1 with max A. Everyday math d/c d/t difficulty with task. Identifying same symbols level 3 with 60% accuracy and 7.57s response time.                      OT Short Term Goals - 12/14/20 1620      OT SHORT TERM GOAL #1   Title Pt/ caregeiver will be I with HEP. 11/29/20    Time 4    Period Weeks    Status Achieved   met, for coordination HEP   Target Date 11/29/20      OT SHORT TERM GOAL #2   Title Pt / wife will verbalize understanding of strategies to increase safety and I with ADLS.    Time 4    Period Weeks    Status Achieved      OT SHORT TERM GOAL #3   Title Pt will perfrom transitional movments for ADLS with close supervision and no LOB.    Baseline histroy of falls    Time 4    Period Weeks    Status Achieved      OT SHORT TERM GOAL #4   Title Pt will demonstrate improved fine motor coordination in his LUE as eveidenced by decreasing 9-hole peg test score by 4 secs. UPDATE: complete in 35 seconds or less with LUE    Baseline 45.72 LUE at eval    Time 4    Period Weeks    Status Revised   39.46s with LUE 12/14/2020     OT SHORT TERM GOAL #5   Title Pt will sequence a simple functional or ADL task  with 90% or better accuracy .    Time 4    Period Weeks    Status Achieved   93% on constant therapy     OT SHORT TERM GOAL #6   Title Pt will demonstrate improved fine motor coordination for ADLs as evidenced by decreasing 3 button/ unbutton time to 90 secs or less.    Time 4    Period Weeks    Status Achieved   49.25            OT Long Term Goals - 12/14/20 1621      OT LONG TERM GOAL #1   Title Pt/ wife will be I with updated HEP for proximal strength.    Time 12    Period Weeks    Status On-going      OT LONG TERM GOAL #2   Title Pt will demonstrate ability to perform simple beverage / snack  prep modifeied  indpendent demonstraing good saftey awareness.    Time 12    Period Weeks    Status Achieved   pt reports doing this at home     OT LONG TERM GOAL #3   Title Pt will perforrm bathing and  dressing mod I in a reasonable amout of time    Baseline increased time required    Time 12    Period Weeks    Status Achieved   per pt report     OT LONG TERM GOAL #4   Title Pt will demonstrate adequate bilateral UE strength to retrieve a lightweight object (3 lbs) from eye level  shelf without dropping with right and left UE's individually.    Time 12    Period Weeks    Status Achieved      OT LONG TERM GOAL #5   Title Pt will increase standing functional reach test score to 10 inches or greater with RUE to minimize fall risk during ADLs.    Time 12    Period Weeks    Status On-going   6"     OT LONG TERM GOAL #6   Title Pt will perform physical and cognitive task with 90% accuracy    Time 12    Period Weeks    Status New                 Plan - 12/14/20 1634    Clinical Impression Statement Pt has met all STGs and has met 4/5 LTGs. Pt continues to work towards unmet goals and revised/new goals. Pt to continue working on increasing fine motor coordination and strength in LUE and dual tasking for cognitive skills for increasing independence with ADLs.    OT Occupational Profile and History Detailed Assessment- Review of Records and additional review of physical, cognitive, psychosocial history related to current functional performance    Occupational performance deficits (Please refer to evaluation for details): ADL's;IADL's;Leisure;Social Participation    Body Structure / Function / Physical Skills ADL;UE functional use;Balance;FMC;GMC;Coordination;Decreased knowledge of use of DME;IADL;Dexterity;Strength;Mobility    Cognitive Skills Memory;Safety Awareness;Problem Solve;Sequencing;Thought;Understand;Attention    OT Frequency 2x / week    OT Duration 12 weeks   12 weeks total. renewal  completed at week 5/visit 10 to address goals.   OT Treatment/Interventions Self-care/ADL training;Energy conservation;Patient/family education;DME and/or AE instruction;Aquatic Therapy;Paraffin;Passive range of motion;Balance training;Fluidtherapy;Cryotherapy;Therapist, nutritional;Therapeutic activities;Manual Therapy;Therapeutic exercise;Moist Heat;Neuromuscular education;Cognitive remediation/compensation    Plan exercises for shoulder strength/ ROM, cane vs. low level theraband, standing actvities    Consulted and Agree with Plan of Care Patient;Family member/caregiver           Patient will benefit from skilled therapeutic intervention in order to improve the following deficits and impairments:   Body Structure / Function / Physical Skills: ADL,UE functional use,Balance,FMC,GMC,Coordination,Decreased knowledge of use of DME,IADL,Dexterity,Strength,Mobility Cognitive Skills: Memory,Safety Awareness,Problem Solve,Sequencing,Thought,Understand,Attention     Visit Diagnosis: Attention and concentration deficit - Plan: Ot plan of care cert/re-cert  Other lack of coordination - Plan: Ot plan of care cert/re-cert  Frontal lobe and executive function deficit - Plan: Ot plan of care cert/re-cert  Muscle weakness (generalized) - Plan: Ot plan of care cert/re-cert  Unsteadiness on feet - Plan: Ot plan of care cert/re-cert  Other abnormalities of gait and mobility - Plan: Ot plan of care cert/re-cert    Problem List Patient Active Problem List   Diagnosis Date Noted  . AAA (abdominal aortic aneurysm) without rupture ([place]) 11/14/2020  . Subdural  hematoma ([place]) 08/15/2020  . Iron deficiency anemia due to chronic blood loss 01/06/2019  . Orthostatic hypotension 01/05/2019    Class: Question of  . Normocytic normochromic anemia 01/05/2019  . Near syncope 01/05/2019  . Chronic respiratory failure with hypoxia ([place]) 06/30/2018  . OSA (obstructive sleep apnea) 02/11/2018  .  Physical deconditioning 08/11/2017  . Shortness of breath 10/24/2014  . COPD (chronic obstructive pulmonary disease) with emphysema ([place]) 09/01/2014  . Fatigue 10/14/2011  . Atrial fibrillation ([place]) 05/15/2011  . Premature ventricular contractions (PVCs) (VPCs) 02/25/2011  . 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  . PERSISTENT DISORDER INITIATING/MAINTAINING SLEEP 05/10/2010  . HYPERCHOLESTEROLEMIA 05/09/2010  . GOUT 05/09/2010  . OBESITY 05/09/2010  . Obstructive sleep apnea 05/09/2010  . Essential hypertension 05/09/2010  . VENTRICULAR TACHYCARDIA 05/09/2010    Zachery Conch MOT, OTR/L  12/14/2020, 6:18 PM  [facility] [address] [place], [place], [address] Phone: [phone]   Fax:  [phone]  Name: [name] MRN: [id number] Date of Birth: 26-Oct-1933

## 2020-12-14 NOTE — Therapy (Signed)
[facility] [address] [place], [place], [address] Phone: [phone]   Fax:  [phone]  Physical Therapy Treatment  Patient Details  Name: KALADIN NOSEWORTHY MRN: [id number] Date of Birth: 04-08-1934 Referring Provider (PT): Dohmeier, Asencion Partridge, MD (is being followed by, was referred by hospitalist)   Encounter Date: 12/14/2020   PT End of Session - 12/14/20 1538    Visit Number 13    Number of Visits 17    Date for PT Re-Evaluation 01/22/21    Authorization Type UHC Medicare    Progress Note Due on Visit 20    PT Start Time 1530    PT Stop Time 1615    PT Time Calculation (min) 45 min    Equipment Utilized During Treatment Gait belt    Activity Tolerance Patient tolerated treatment well    Behavior During Therapy [facility] for tasks assessed/performed           Past Medical History:  Diagnosis Date  . Afib ([place])   . Arthritis   . BPH (benign prostatic hyperplasia)   . COPD (chronic obstructive pulmonary disease) ([place])   . Coronary artery disease   . Coronary artery ectasia    Mild CAD with normal systolic function per cath in August of 2011  . Depression   . Diaphragmatic paralysis    felt to be partially responsible for dyspnea  . Dyspnea    due to paralyzed diaphragm and pulmonary issues  . Dysrhythmia   . GERD (gastroesophageal reflux disease)   . Gout   . HTN (hypertension)   . Hypercholesteremia   . Hypertension   . Iron deficiency anemia   . Microhematuria   . Obesity   . OSA (obstructive sleep apnea)    CPAP  . Paroxysmal atrial fibrillation (HCC)    occured in the setting of acute E Coli sepsis and ileius 7/12  . PVC (premature ventricular contraction)    s/p PVC ablation 05/29/2010    Past Surgical History:  Procedure Laterality Date  . APPENDECTOMY  1978  . BIOPSY  02/09/2019   Procedure: BIOPSY;  Surgeon: Clarene Essex, MD;  [facility];  Service: Endoscopy;;  . BIOPSY  03/02/2019    Procedure: BIOPSY;  Surgeon: Clarene Essex, MD;  [facility];  Service: Endoscopy;;  . CARDIAC CATHETERIZATION  04-30-2010   Left main coronary artery is normal.   . CARDIOVASCULAR STRESS TEST  03-19-2010   0%  . CHOLECYSTECTOMY  1990  . COLONOSCOPY N/A 03/02/2019   Procedure: COLONOSCOPY;  Surgeon: Clarene Essex, MD;  [facility];  Service: Endoscopy;  Laterality: N/A;  . COLONOSCOPY W/ POLYPECTOMY    . ESOPHAGOGASTRODUODENOSCOPY (EGD) WITH PROPOFOL N/A 02/09/2019   Procedure: ESOPHAGOGASTRODUODENOSCOPY (EGD) WITH PROPOFOL;  Surgeon: Clarene Essex, MD;  [facility];  Service: Endoscopy;  Laterality: N/A;  . EYE SURGERY Right    catarct  . poly removed from nose as a child    . PVC ablation  05/29/2010  . RADIOLOGY WITH ANESTHESIA N/A 11/07/2020   Procedure: MRV HEAD WITH AND WITHOUT;  Surgeon: Radiologist, Medication, MD;  Location: [facility];  Service: Radiology;  Laterality: N/A;  . RADIOLOGY WITH ANESTHESIA N/A 11/07/2020   Procedure: CT LUMBAR SPINE WITHOUT;  Surgeon: Radiologist, Medication, MD;  Location: [facility];  Service: Radiology;  Laterality: N/A;  . US ECHOCARDIOGRAPHY  03-09-2010   EF 55-60%    There were no vitals filed for this visit.   Subjective Assessment - 12/14/20  1537    Subjective No falls or med changes, L knee sore from weather    Patient is accompained by: Family member   wife, [name]   Pertinent History HTN, CAD, COPD, A-fib    Limitations Standing;Walking    How long can you sit comfortably? 30'    How long can you stand comfortably? 27'    How long can you walk comfortably? 70'    Patient Stated Goals wants to improve his balance    Currently in Pain? Yes    Pain Score 2     Pain Location Knee    Pain Orientation Left    Pain Type Acute pain    Pain Onset Today    Pain Frequency Occasional                             [facility] Adult PT Treatment/Exercise - 12/14/20 0001      Transfers   Transfers Sit to Stand    Sit  to Stand 5: Supervision;4: Min guard    Comments held multiple reps due to knee pain      Ambulation/Gait   Ambulation/Gait Yes    Ambulation/Gait Assistance 4: Min guard    Ambulation/Gait Assistance Details in clinic distances    Assistive device Straight cane    Gait Pattern Step-through pattern;Decreased arm swing - right;Decreased arm swing - left;Decreased step length - right;Decreased step length - left    Ambulation Surface Level;Indoor      Knee/Hip Exercises: Aerobic   Nustep 8" L3 arms 10               Balance Exercises - 12/14/20 0001      Balance Exercises: Standing   Stepping Strategy Anterior;Lateral;Foam/compliant surface;10 reps;Limitations    Stepping Strategy Limitations runner step from Airex mat, 10x per leg in // bars    Rockerboard Anterior/posterior;30 seconds;Intermittent UE support;Limitations    Rockerboard Limitations 30s hold x2 with intermittent UE support, then static stand with alt. arm mvmnts for 1 min.    Step Ups Forward;Lateral;UE support 2;Limitations    Step Ups Limitations performed onto airex, 10 reps fwd and larteral directions    Tandem Gait Forward;Retro;5 reps;Limitations    Tandem Gait Limitations tandem gait fwd, retrowalk on blue mat, 5 trips in // bars    Sidestepping Foam/compliant support;5 reps;Limitations    Sidestepping Limitations 5 trips in // bars across blue mat               PT Short Term Goals - 11/28/20 1455      PT SHORT TERM GOAL #1   Title Pt will initiate HEP in order to indicate improved functional mobility and decreased fall risk.  ALL STGS DUE 11/21/20    Baseline reviewed HEP on 11/28/20    Time 4    Period Weeks    Status Achieved    Target Date 11/21/20      PT SHORT TERM GOAL #2   Title Pt will perform 5x sit <> stand in 17 seconds or less with BUE support in order to demo improved functional strength.    Baseline 19.31 seconds, 10.97 seconds with BUE support on 11/28/20    Time 4    Period  Weeks    Status Achieved      PT SHORT TERM GOAL #3   Title Pt will undergo BERG with STG and LTG written as appropriate.    Baseline 10/27/20 BERG  37/56    Time 4    Period Weeks    Status Achieved      PT SHORT TERM GOAL #4   Title Pt will improve gait speed with rollator to at least 2.5 ft/sec in order demo improved community mobility.    Baseline 2.18 ft/sec, 14.41 seconds = 2.27 ft/sec on 3/22    Time 4    Period Weeks    Status Not Met      PT SHORT TERM GOAL #5   Title Pt and pt's spouse will verbalize understanding of fall prevention in the home to decr fall risk.    Time 4    Period Weeks    Status New             PT Long Term Goals - 10/27/20 1827      PT LONG TERM GOAL #1   Title Pt will be independent with final HEP in order to indicate improved functional mobility and decreased fall risk.  ALL LTGS DUE 12/19/20    Time 8    Period Weeks    Status New      PT LONG TERM GOAL #2   Title BERG goal to be written as appropriate to determine decr fall risk.  BERG goal is 45/56    Baseline 37    Time 8    Period Weeks    Status New      PT LONG TERM GOAL #3   Title Pt will perform 5x sit <> stand in 14.5 seconds or less with BUE vs. single UE support in order to demo improved functional strength.    Baseline 19.31 seconds    Time 8    Period Weeks    Status New      PT LONG TERM GOAL #4   Title Pt will ambulate at least 150' over indoor surfaces with cane vs. LRAD with supervision in order to demo improved household mobility.    Time 8    Period Weeks    Status New      PT LONG TERM GOAL #5   Title Pt will perform TUG in 14.5 seconds or less with rollator vs. LRAD in order to demo decr fall risk.    Baseline 16.94 seconds with rollator.    Time 8    Period Weeks    Status New                 Plan - 12/14/20 1539    Clinical Impression Statement Skilled treatment today modified to accomodate L knee soreness from rainy weather, continued to  work on stepping and balance tasks to facilitate hip flexion and ability to place R foot onto step, added dynamic balance tasks on foam pad and rocker board    Personal Factors and Comorbidities Comorbidity 3+;Past/Current Experience    Comorbidities HTN, CAD, COPD, A-fib, hx of COVID march 2021    Examination-Activity Limitations Stand;Squat;Transfers;Reach Overhead;Locomotion Level    Examination-Participation Restrictions Community Activity;Driving    Stability/Clinical Decision Making Evolving/Moderate complexity    Rehab Potential Good    PT Frequency 2x / week    PT Duration 8 weeks    PT Treatment/Interventions ADLs/Self Care Home Management;DME Instruction;Gait training;Functional mobility training;Stair training;Neuromuscular re-education;Balance training;Therapeutic exercise;Therapeutic activities;Patient/family education;Vestibular;Energy conservation    PT Next Visit Plan Assess ongoing L knee pain, continue to pursue stepping tasks and balance tasks to address posterior LOB and apprehension on compliant surfaces    Consulted and Agree with Plan of  Care Patient           Patient will benefit from skilled therapeutic intervention in order to improve the following deficits and impairments:  Abnormal gait,Decreased activity tolerance,Decreased balance,Decreased endurance,Decreased coordination,Decreased cognition,Decreased knowledge of use of DME,Decreased range of motion,Difficulty walking,Decreased safety awareness,Decreased strength  Visit Diagnosis: Unsteadiness on feet  Muscle weakness (generalized)     Problem List Patient Active Problem List   Diagnosis Date Noted  . AAA (abdominal aortic aneurysm) without rupture ([place]) 11/14/2020  . Subdural hematoma ([place]) 08/15/2020  . Iron deficiency anemia due to chronic blood loss 01/06/2019  . Orthostatic hypotension 01/05/2019    Class: Question of  . Normocytic normochromic anemia 01/05/2019  . Near syncope 01/05/2019  .  Chronic respiratory failure with hypoxia ([place]) 06/30/2018  . OSA (obstructive sleep apnea) 02/11/2018  . Physical deconditioning 08/11/2017  . Shortness of breath 10/24/2014  . COPD (chronic obstructive pulmonary disease) with emphysema ([place]) 09/01/2014  . Fatigue 10/14/2011  . Atrial fibrillation ([place]) 05/15/2011  . Premature ventricular contractions (PVCs) ([facility]) 02/25/2011  . 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  . PERSISTENT DISORDER INITIATING/MAINTAINING SLEEP 05/10/2010  . HYPERCHOLESTEROLEMIA 05/09/2010  . GOUT 05/09/2010  . OBESITY 05/09/2010  . Obstructive sleep apnea 05/09/2010  . Essential hypertension 05/09/2010  . VENTRICULAR TACHYCARDIA 05/09/2010    Lanice Shirts PT 12/14/2020, 4:43 PM  [facility] [address] [place], [place], [address] Phone: [phone]   Fax:  [phone]  Name: JAMAINE QUINTIN MRN: [id number] Date of Birth: 1934-06-18

## 2020-12-19 ENCOUNTER — Ambulatory Visit: Payer: Medicare Other | Admitting: Physical Therapy

## 2020-12-19 ENCOUNTER — Ambulatory Visit: Payer: Medicare Other

## 2020-12-19 ENCOUNTER — Encounter: Payer: Self-pay | Admitting: Occupational Therapy

## 2020-12-19 ENCOUNTER — Other Ambulatory Visit: Payer: Self-pay

## 2020-12-19 ENCOUNTER — Ambulatory Visit: Payer: Medicare Other | Admitting: Occupational Therapy

## 2020-12-19 ENCOUNTER — Encounter: Payer: Self-pay | Admitting: Physical Therapy

## 2020-12-19 DIAGNOSIS — R278 Other lack of coordination: Secondary | ICD-10-CM

## 2020-12-19 DIAGNOSIS — R41844 Frontal lobe and executive function deficit: Secondary | ICD-10-CM

## 2020-12-19 DIAGNOSIS — R41841 Cognitive communication deficit: Secondary | ICD-10-CM

## 2020-12-19 DIAGNOSIS — M6281 Muscle weakness (generalized): Secondary | ICD-10-CM

## 2020-12-19 DIAGNOSIS — R2681 Unsteadiness on feet: Secondary | ICD-10-CM

## 2020-12-19 DIAGNOSIS — R41840 Attention and concentration deficit: Secondary | ICD-10-CM

## 2020-12-19 DIAGNOSIS — R2689 Other abnormalities of gait and mobility: Secondary | ICD-10-CM

## 2020-12-19 NOTE — Therapy (Signed)
[facility] [address] [place], [place], [address] Phone: [phone]   Fax:  [phone]  Occupational Therapy Treatment  Patient Details  Name: [name] MRN: [id number] Date of Birth: 05/27/1934 Referring Provider (OT): Dr. Brett Fairy (referred by hospitalist with send to neurology)   Encounter Date: 12/19/2020   OT End of Session - 12/19/20 1659    Visit Number 11    Number of Visits 25    Date for OT Re-Evaluation 01/24/21    Authorization Type [employer] Medicare    Authorization Time Period 90 days, anticipate d/c after 8 weeks . Week 6 (12/19/20)    Authorization - Visit Number 11    Authorization - Number of Visits 20    OT Start Time 1700    OT Stop Time 1740    OT Time Calculation (min) 40 min    Activity Tolerance Patient tolerated treatment well    Behavior During Therapy WFL for tasks assessed/performed           Past Medical History:  Diagnosis Date  . Afib ([place])   . Arthritis   . BPH (benign prostatic hyperplasia)   . COPD (chronic obstructive pulmonary disease) ([place])   . Coronary artery disease   . Coronary artery ectasia    Mild CAD with normal systolic function per cath in August of 2011  . Depression   . Diaphragmatic paralysis    felt to be partially responsible for dyspnea  . Dyspnea    due to paralyzed diaphragm and pulmonary issues  . Dysrhythmia   . GERD (gastroesophageal reflux disease)   . Gout   . HTN (hypertension)   . Hypercholesteremia   . Hypertension   . Iron deficiency anemia   . Microhematuria   . Obesity   . OSA (obstructive sleep apnea)    CPAP  . Paroxysmal atrial fibrillation (H[facility])    occured in the setting of acute E Coli sepsis and ileius 7/12  . PVC (premature ventricular contraction)    s/p PVC ablation 05/29/2010    Past Surgical History:  Procedure Laterality Date  . APPENDECTOMY  1978  . BIOPSY  02/09/2019   Procedure: BIOPSY;  Surgeon: Clarene Essex,  MD;  Location: [facility];  Service: Endoscopy;;  . BIOPSY  03/02/2019   Procedure: BIOPSY;  Surgeon: Clarene Essex, MD;  Location: [facility];  Service: Endoscopy;;  . CARDIAC CATHETERIZATION  04-30-2010   Left main coronary artery is normal.   . CARDIOVASCULAR STRESS TEST  03-19-2010   0%  . CHOLECYSTECTOMY  1990  . COLONOSCOPY N/A 03/02/2019   Procedure: COLONOSCOPY;  Surgeon: Clarene Essex, MD;  Location[facility];  Service: Endoscopy;  Laterality: N/A;  . COLONOSCOPY W/ POLYPECTOMY    . ESOPHAGOGASTRODUODENOSCOPY (EGD) WITH PROPOFOL N/A 02/09/2019   Procedure: ESOPHAGOGASTRODUODENOSCOPY (EGD) WITH PROPOFOL;  Surgeon: Clarene Essex, MD;  [facility];  Service: Endoscopy;  Laterality: N/A;  . EYE SURGERY Right    catarct  . poly removed from nose as a child    . PVC ablation  05/29/2010  . RADIOLOGY WITH ANESTHESIA N/A 11/07/2020   Procedure: MRV HEAD WITH AND WITHOUT;  Surgeon: Radiologist, Medication, MD;  Location: [facility];  Service: Radiology;  Laterality: N/A;  . RADIOLOGY WITH ANESTHESIA N/A 11/07/2020   Procedure: CT LUMBAR SPINE WITHOUT;  Surgeon: Radiologist, Medication, MD;  Location: [facility];  Service: Radiology;  Laterality: N/A;  . US ECHOCARDIOGRAPHY  03-09-2010   EF 55-60%  There were no vitals filed for this visit.   Subjective Assessment - 12/19/20 1700    Subjective  Pt denies any pain.    Pertinent History Pt had a fall while shopping in [employer] on 08/15/20. Pt sustained a parafalcine and tentorial SDH. Was discharged from the hospital on 08/17/20.PMH:HTN, CAD, COPD, A-fib    Patient Stated Goals improve balance and strength    Currently in Pain? No/denies           TREATMENT:   Pt reports "everything is a challenge".   Small Pegs on vertical surface while in standing. Pt placed small pegs with LUE with increased time and moderate drops and difficulty. Pt copied pattern with min difficulty and ability to self-correct errors with min cues. No LOB   Blink  with sorting by shape, color and quality. Pt is color blind so we d/c color category. Pt required max cueing for attending to all details and recalling different categories for sorting.                     OT Short Term Goals - 12/14/20 1620      OT SHORT TERM GOAL #1   Title Pt/ caregeiver will be I with HEP. 11/29/20    Time 4    Period Weeks    Status Achieved   met, for coordination HEP   [place] Date 11/29/20      OT SHORT TERM GOAL #2   Title Pt / wife will verbalize understanding of strategies to increase safety and I with ADLS.    Time 4    Period Weeks    Status Achieved      OT SHORT TERM GOAL #3   Title Pt will perfrom transitional movments for ADLS with close supervision and no LOB.    Baseline histroy of falls    Time 4    Period Weeks    Status Achieved      OT SHORT TERM GOAL #4   Title Pt will demonstrate improved fine motor coordination in his LUE as eveidenced by decreasing 9-hole peg test score by 4 secs. UPDATE: complete in 35 seconds or less with LUE    Baseline 45.72 LUE at eval    Time 4    Period Weeks    Status Revised   39.46s with LUE 12/14/2020     OT SHORT TERM GOAL #5   Title Pt will sequence a simple functional or ADL task  with 90% or better accuracy .    Time 4    Period Weeks    Status Achieved   93% on constant therapy     OT SHORT TERM GOAL #6   Title Pt will demonstrate improved fine motor coordination for ADLs as evidenced by decreasing 3 button/ unbutton time to 90 secs or less.    Time 4    Period Weeks    Status Achieved   49.25            OT Long Term Goals - 12/14/20 1621      OT LONG TERM GOAL #1   Title Pt/ wife will be I with updated HEP for proximal strength.    Time 12    Period Weeks    Status On-going      OT LONG TERM GOAL #2   Title Pt will demonstrate ability to perform simple beverage / snack prep modifeied indpendent demonstraing good saftey awareness.    Time 12  Period Weeks    Status  Achieved   pt reports doing this at home     OT LONG TERM GOAL #3   Title Pt will perforrm bathing and  dressing mod I in a reasonable amout of time    Baseline increased time required    Time 12    Period Weeks    Status Achieved   per pt report     OT LONG TERM GOAL #4   Title Pt will demonstrate adequate bilateral UE strength to retrieve a lightweight object (3 lbs) from eye level  shelf without dropping with right and left UE's individually.    Time 12    Period Weeks    Status Achieved      OT LONG TERM GOAL #5   Title Pt will increase standing functional reach test score to 10 inches or greater with RUE to minimize fall risk during ADLs.    Time 12    Period Weeks    Status On-going   6"     OT LONG TERM GOAL #6   Title Pt will perform physical and cognitive task with 90% accuracy    Time 12    Period Weeks    Status New                  Patient will benefit from skilled therapeutic intervention in order to improve the following deficits and impairments:           Visit Diagnosis: Attention and concentration deficit  Other lack of coordination  Frontal lobe and executive function deficit  Muscle weakness (generalized)  Unsteadiness on feet    Problem List Patient Active Problem List   Diagnosis Date Noted  . AAA (abdominal aortic aneurysm) without rupture ([place]) 11/14/2020  . Subdural hematoma ([place]) 08/15/2020  . Iron deficiency anemia due to chronic blood loss 01/06/2019  . Orthostatic hypotension 01/05/2019    Class: Question of  . Normocytic normochromic anemia 01/05/2019  . Near syncope 01/05/2019  . Chronic respiratory failure with hypoxia ([place]) 06/30/2018  . OSA (obstructive sleep apnea) 02/11/2018  . Physical deconditioning 08/11/2017  . Shortness of breath 10/24/2014  . COPD (chronic obstructive pulmonary disease) with emphysema ([place]) 09/01/2014  . Fatigue 10/14/2011  . Atrial fibrillation ([place]) 05/15/2011  . Premature ventricular  contractions (PVCs) ([facility]) 02/25/2011  . 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  . PERSISTENT DISORDER INITIATING/MAINTAINING SLEEP 05/10/2010  . HYPERCHOLESTEROLEMIA 05/09/2010  . GOUT 05/09/2010  . OBESITY 05/09/2010  . Obstructive sleep apnea 05/09/2010  . Essential hypertension 05/09/2010  . VENTRICULAR TACHYCARDIA 05/09/2010    Zachery Conch MOT, OTR/L  12/19/2020, 5:44 PM  [facility] [address] [place], [place], [address] Phone: [phone]   Fax:  [phone]  Name: REYLI SCHROTH MRN: [id number] Date of Birth: [birth year]

## 2020-12-19 NOTE — Patient Instructions (Signed)
  Please complete the assigned speech therapy homework prior to your next session and return it to the speech therapist at your next visit.  

## 2020-12-19 NOTE — Therapy (Signed)
[name] [address] [place], [place], [address] Phone: [phone]   Fax:  [phone]  Speech Language Pathology Treatment  Patient Details  Name: [name] MRN: [id number] Date of Birth: [birth year] Referring Provider (SLP): Margie Billet, PA-C; Dr. [name] PCP (doc)   Encounter Date: 12/19/2020   End of Session - 12/19/20 1716    Visit Number 7    Number of Visits 17    Date for SLP Re-Evaluation 01/29/21    SLP Start Time 1533    SLP Stop Time  1615    SLP Time Calculation (min) 42 min    Activity Tolerance Patient tolerated treatment well           Past Medical History:  Diagnosis Date  . Afib ([place])   . Arthritis   . BPH (benign prostatic hyperplasia)   . COPD (chronic obstructive pulmonary disease) ([place])   . Coronary artery disease   . Coronary artery ectasia    Mild CAD with normal systolic function per cath in August of 2011  . Depression   . Diaphragmatic paralysis    felt to be partially responsible for dyspnea  . Dyspnea    due to paralyzed diaphragm and pulmonary issues  . Dysrhythmia   . GERD (gastroesophageal reflux disease)   . Gout   . HTN (hypertension)   . Hypercholesteremia   . Hypertension   . Iron deficiency anemia   . Microhematuria   . Obesity   . OSA (obstructive sleep apnea)    CPAP  . Paroxysmal atrial fibrillation ([facility])    occured in the setting of acute E Coli sepsis and ileius 7/12  . PVC (premature ventricular contraction)    s/p PVC ablation 05/29/2010    Past Surgical History:  Procedure Laterality Date  . APPENDECTOMY  1978  . BIOPSY  02/09/2019   Procedure: BIOPSY;  Surgeon: Clarene Essex, MD;  [facility];  Service: Endoscopy;;  . BIOPSY  03/02/2019   Procedure: BIOPSY;  Surgeon: Clarene Essex, MD;  [facility];  Service: Endoscopy;;  . CARDIAC CATHETERIZATION  04-30-2010   Left main coronary artery is normal.   .  CARDIOVASCULAR STRESS TEST  03-19-2010   0%  . CHOLECYSTECTOMY  1990  . COLONOSCOPY N/A 03/02/2019   Procedure: COLONOSCOPY;  Surgeon: Clarene Essex, MD;  [facility];  Service: Endoscopy;  Laterality: N/A;  . COLONOSCOPY W/ POLYPECTOMY    . ESOPHAGOGASTRODUODENOSCOPY (EGD) WITH PROPOFOL N/A 02/09/2019   Procedure: ESOPHAGOGASTRODUODENOSCOPY (EGD) WITH PROPOFOL;  Surgeon: Clarene Essex, MD;  [facility];  Service: Endoscopy;  Laterality: N/A;  . EYE SURGERY Right    catarct  . poly removed from nose as a child    . PVC ablation  05/29/2010  . RADIOLOGY WITH ANESTHESIA N/A 11/07/2020   Procedure: MRV HEAD WITH AND WITHOUT;  Surgeon: Radiologist, Medication, MD;  Location: [facility];  Service: Radiology;  Laterality: N/A;  . RADIOLOGY WITH ANESTHESIA N/A 11/07/2020   Procedure: CT LUMBAR SPINE WITHOUT;  Surgeon: Radiologist, Medication, MD;  Location: [facility];  Service: Radiology;  Laterality: N/A;  . US ECHOCARDIOGRAPHY  03-09-2010   EF 55-60%    There were no vitals filed for this visit.   Subjective Assessment - 12/19/20 1542    Subjective Pt tells SLP that he did PT exercises every day since last ST session.    Currently in Pain? No/denies  ADULT SLP TREATMENT - 12/19/20 1545      General Information   Behavior/Cognition Alert;Cooperative;Pleasant mood      Treatment Provided   Treatment provided Cognitive-Linquistic      Cognitive-Linquistic Treatment   Treatment focused on Cognition    Skilled Treatment Pt stated he did not need to set alarm in order to get PT HEP in each day. He used the folderas a visual cue to remind himself that he needed to do the exercises. Pt req'd extra time and min A occasionally for verbalizing med schedule to SLP. AFterwards, only upon cueing, pt with error awareness he had not told SLP 3 morning meds. SLP asked pt about golf match last weekend and pt immediately told SLP 3 details. Pt was provided homework that he should  work 30 minutes/day upon.      Assessment / Recommendations / Plan   Plan Continue with current plan of care      Progression Toward Goals   Progression toward goals Progressing toward goals              SLP Short Term Goals - 12/19/20 1718      SLP SHORT TERM GOAL #1   Title pt will perform functional reading and/or written tasks for 7 minutes in 2 sessions    Baseline 12-19-20    Time 1    Period Weeks    Status On-going      SLP SHORT TERM GOAL #2   Title pt will process verbal or written directions with 80% accuracy with occasional min A x3 vistis    Baseline 12-05-20, 12-12-20    Status Achieved      SLP SHORT TERM GOAL #3   Title pt will verbally state 3 strategies to improve memory skills in 3 sessions    Baseline 12-12-20 (phone alarm), 12-19-20 (visual cue - blue folder)    Time 1    Period Weeks    Status On-going      SLP SHORT TERM GOAL #4   Title pt will demo understanding of his non-physical deficits folloiwng brain bleed with modified independence in 3 sessions    Baseline 12-12-20    Time 1    Period Weeks    Status On-going            SLP Long Term Goals - 12/19/20 1556      SLP LONG TERM GOAL #1   Title Pt will use memory compensation system for daily tasks when appropriate, x1 in 4 sessions    Baseline 12-19-20 (PT HEP),    Time 5    Period Weeks   or 17 visits, for all LTGs   Status On-going      SLP LONG TERM GOAL #2   Title pt will demonstrate error awareness in linguistic tasks 90% of the time with nonverbal cues in 3 sessions    Time 5    Period Weeks    Status On-going      SLP LONG TERM GOAL #3   Title pt and/or wife will report pt has completed tasks demonstrating ability to solve problems in the home environment x3 sessions    Time 5    Period Weeks    Status On-going      SLP LONG TERM GOAL #4   Title pt will show knowledge of compensating for cognitive deficits in linguistic tasks    Time 5    Period Weeks    Status On-going  Plan - 12/19/20 1717    Clinical Impression Statement [name] presents today with cognitive communication deficits in areas of attention, problem solving, processing, awareness, and memory. Awareness of deficits continues to slowly improve. SLP has not noted overt s/sx aphasia to date. SLP believes pt will benefit from skilled ST tageting these cognitive deficits and assist pt to regain his PLOF.    Speech Therapy Frequency 2x / week    Duration 8 weeks   or 17 total sessions   Treatment/Interventions Language facilitation;Compensatory techniques;Internal/external aids;SLP instruction and feedback;Patient/family education;Environmental controls;Cognitive reorganization    Potential to Achieve Goals Good    Consulted and Agree with Plan of Care Patient;Family member/caregiver           Patient will benefit from skilled therapeutic intervention in order to improve the following deficits and impairments:   Cognitive communication deficit    Problem List Patient Active Problem List   Diagnosis Date Noted  . AAA (abdominal aortic aneurysm) without rupture ([place]) 11/14/2020  . Subdural hematoma ([place]) 08/15/2020  . Iron deficiency anemia due to chronic blood loss 01/06/2019  . Orthostatic hypotension 01/05/2019    Class: Question of  . Normocytic normochromic anemia 01/05/2019  . Near syncope 01/05/2019  . Chronic respiratory failure with hypoxia ([place]) 06/30/2018  . OSA (obstructive sleep apnea) 02/11/2018  . Physical deconditioning 08/11/2017  . Shortness of breath 10/24/2014  . COPD (chronic obstructive pulmonary disease) with emphysema ([place]) 09/01/2014  . Fatigue 10/14/2011  . Atrial fibrillation ([place]) 05/15/2011  . Premature ventricular contractions (PVCs) ([facility]) 02/25/2011  . 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  . PERSISTENT DISORDER INITIATING/MAINTAINING SLEEP 05/10/2010  . HYPERCHOLESTEROLEMIA 05/09/2010  . GOUT 05/09/2010  . OBESITY 05/09/2010  .  Obstructive sleep apnea 05/09/2010  . Essential hypertension 05/09/2010  . VENTRICULAR TACHYCARDIA 05/09/2010    [facility] ,MS, CCC-SLP  12/19/2020, 5:20 PM  [facility] [address] [place], [place], [address] Phone: [phone]   Fax:  [phone]   Name: KRISHIV SANDLER MRN: [id number] Date of Birth: February 14, 1934

## 2020-12-19 NOTE — Therapy (Signed)
[facility] [address] [place], [place], [address] Phone: [phone]   Fax:  [phone]  Physical Therapy Treatment  Patient Details  Name: [name] MRN: [id number] Date of Birth: Aug 21, 1934 Referring Provider (PT): Dohmeier, Asencion Partridge, MD (is being followed by, was referred by hospitalist)   Encounter Date: 12/19/2020   PT End of Session - 12/19/20 1703    Visit Number 14    Number of Visits 17    Date for PT Re-Evaluation 01/22/21    Authorization Type UHC Medicare    Progress Note Due on Visit 20    PT Start Time 1618    PT Stop Time 1658    PT Time Calculation (min) 40 min    Equipment Utilized During Treatment Gait belt    Activity Tolerance Patient tolerated treatment well    Behavior During Therapy [facility] for tasks assessed/performed           Past Medical History:  Diagnosis Date  . Afib ([place])   . Arthritis   . BPH (benign prostatic hyperplasia)   . COPD (chronic obstructive pulmonary disease) ([place])   . Coronary artery disease   . Coronary artery ectasia    Mild CAD with normal systolic function per cath in August of 2011  . Depression   . Diaphragmatic paralysis    felt to be partially responsible for dyspnea  . Dyspnea    due to paralyzed diaphragm and pulmonary issues  . Dysrhythmia   . GERD (gastroesophageal reflux disease)   . Gout   . HTN (hypertension)   . Hypercholesteremia   . Hypertension   . Iron deficiency anemia   . Microhematuria   . Obesity   . OSA (obstructive sleep apnea)    CPAP  . Paroxysmal atrial fibrillation (HCC)    occured in the setting of acute E Coli sepsis and ileius 7/12  . PVC (premature ventricular contraction)    s/p PVC ablation 05/29/2010    Past Surgical History:  Procedure Laterality Date  . APPENDECTOMY  1978  . BIOPSY  02/09/2019   Procedure: BIOPSY;  Surgeon: Clarene Essex, MD;  [facility]: [facility];  Service: Endoscopy;;  . BIOPSY  03/02/2019    Procedure: BIOPSY;  Surgeon: Clarene Essex, MD;  [facility];  Service: Endoscopy;;  . CARDIAC CATHETERIZATION  04-30-2010   Left main coronary artery is normal.   . CARDIOVASCULAR STRESS TEST  03-19-2010   0%  . CHOLECYSTECTOMY  1990  . COLONOSCOPY N/A 03/02/2019   Procedure: COLONOSCOPY;  Surgeon: Clarene Essex, MD;  [facility];  Service: Endoscopy;  Laterality: N/A;  . COLONOSCOPY W/ POLYPECTOMY    . ESOPHAGOGASTRODUODENOSCOPY (EGD) WITH PROPOFOL N/A 02/09/2019   Procedure: ESOPHAGOGASTRODUODENOSCOPY (EGD) WITH PROPOFOL;  Surgeon: Clarene Essex, MD;  [facility];  Service: Endoscopy;  Laterality: N/A;  . EYE SURGERY Right    catarct  . poly removed from nose as a child    . PVC ablation  05/29/2010  . RADIOLOGY WITH ANESTHESIA N/A 11/07/2020   Procedure: MRV HEAD WITH AND WITHOUT;  Surgeon: Radiologist, Medication, MD;  Location: [facility];  Service: Radiology;  Laterality: N/A;  . RADIOLOGY WITH ANESTHESIA N/A 11/07/2020   Procedure: CT LUMBAR SPINE WITHOUT;  Surgeon: Radiologist, Medication, MD;  Location: [facility];  Service: Radiology;  Laterality: N/A;  . US ECHOCARDIOGRAPHY  03-09-2010   EF 55-60%    There were no vitals filed for this visit.   Subjective Assessment - 12/19/20  1620    Subjective Has been walking in with his cane. Had a good birthday.    Patient is accompained by: Family member   wife, [name]   Pertinent History HTN, CAD, COPD, A-fib    Limitations Standing;Walking    How long can you sit comfortably? 29'    How long can you stand comfortably? 32'    How long can you walk comfortably? 32'    Patient Stated Goals wants to improve his balance    Currently in Pain? No/denies    Pain Onset Today              [facility] PT Assessment - 12/19/20 1624      Berg Balance Test   Sit to Stand Able to stand without using hands and stabilize independently    Standing Unsupported Able to stand safely 2 minutes    Sitting with Back Unsupported but Feet  Supported on Floor or Stool Able to sit safely and securely 2 minutes    Stand to Sit Sits safely with minimal use of hands    Transfers Able to transfer safely, minor use of hands    Standing Unsupported with Eyes Closed Able to stand 10 seconds safely    Standing Unsupported with Feet Together Needs help to attain position but able to stand for 30 seconds with feet together    From Standing, Reach Forward with Outstretched Arm Can reach confidently >25 cm (10")    From Standing Position, Pick up Object from Floor Able to pick up shoe, needs supervision    From Standing Position, Turn to Look Behind Over each Shoulder Looks behind one side only/other side shows less weight shift    Turn 360 Degrees Able to turn 360 degrees safely but slowly    Standing Unsupported, Alternately Place Feet on Step/Stool Able to complete >2 steps/needs minimal assist    Standing Unsupported, One Foot in Front Able to take small step independently and hold 30 seconds    Standing on One Leg Tries to lift leg/unable to hold 3 seconds but remains standing independently    Total Score 41    Berg comment: 41/56 = signifcant fall risk                         [facility] Adult PT Treatment/Exercise - 12/19/20 1626      Transfers   Transfers Sit to Stand    Sit to Stand 5: Supervision;4: Min guard;With upper extremity assist;Without upper extremity assist    Five time sit to stand comments  25.62 seconds with no UE support from mat table, wider BOS    Stand to Sit 5: Supervision;With upper extremity assist;Without upper extremity assist      Ambulation/Gait   Ambulation/Gait Yes    Ambulation/Gait Assistance 4: Min guard;5: Supervision    Ambulation Distance (Feet) 200 Feet   plus additional clinic distances   Assistive device Straight cane   with 4 prong tip   Gait Pattern Step-through pattern;Decreased arm swing - right;Decreased arm swing - left;Decreased step length - right;Decreased step length - left     Ambulation Surface Level;Indoor    Gait velocity 17 seconds = 1.92 ft/sec               Balance Exercises - 12/19/20 0001      Balance Exercises: Standing   Marching Foam/compliant surface;Upper extremity assist 2;Limitations    Marching Limitations slow marching on blue air ex x10  reps    Other Standing Exercises in corner: feet together on level ground: eyes closed x30 seconds, head nods x10 reps, head turns x10 reps, standing on airex with feet aparT: x10 reps head turns, x10 reps head nods (min A at times for balance)             PT Education - 12/19/20 1703    Education Details progress towards goals; will re-cert for an additional month and decide from there (pt currently has a $30 co pay per discipline)    Person(s) Educated Patient    Methods Explanation    Comprehension Verbalized understanding            PT Short Term Goals - 11/28/20 1455      PT SHORT TERM GOAL #1   Title Pt will initiate HEP in order to indicate improved functional mobility and decreased fall risk.  ALL STGS DUE 11/21/20    Baseline reviewed HEP on 11/28/20    Time 4    Period Weeks    Status Achieved    Target Date 11/21/20      PT SHORT TERM GOAL #2   Title Pt will perform 5x sit <> stand in 17 seconds or less with BUE support in order to demo improved functional strength.    Baseline 19.31 seconds, 10.97 seconds with BUE support on 11/28/20    Time 4    Period Weeks    Status Achieved      PT SHORT TERM GOAL #3   Title Pt will undergo BERG with STG and LTG written as appropriate.    Baseline 10/27/20 BERG 37/56    Time 4    Period Weeks    Status Achieved      PT SHORT TERM GOAL #4   Title Pt will improve gait speed with rollator to at least 2.5 ft/sec in order demo improved community mobility.    Baseline 2.18 ft/sec, 14.41 seconds = 2.27 ft/sec on 3/22    Time 4    Period Weeks    Status Not Met      PT SHORT TERM GOAL #5   Title Pt and pt's spouse will verbalize  understanding of fall prevention in the home to decr fall risk.    Time 4    Period Weeks    Status New             PT Long Term Goals - 12/19/20 1631      PT LONG TERM GOAL #1   Title Pt will be independent with final HEP in order to indicate improved functional mobility and decreased fall risk.  ALL LTGS DUE 12/19/20    Time 8    Period Weeks    Status New      PT LONG TERM GOAL #2   Title BERG goal to be written as appropriate to determine decr fall risk.  BERG goal is 45/56    Baseline 37; 41/56 on 12/19/20    Time 8    Period Weeks    Status Not Met      PT LONG TERM GOAL #3   Title Pt will perform 5x sit <> stand in 14.5 seconds or less with BUE vs. single UE support in order to demo improved functional strength.    Baseline 19.31 seconds; 10.97 seconds with BUE support on 11/28/20    Time 8    Period Weeks    Status Achieved      PT LONG TERM  GOAL #4   Title Pt will ambulate at least 150' over indoor surfaces with cane vs. LRAD with supervision in order to demo improved household mobility.    Baseline supervision/min guard with SPC with quad tip on 12/19/20    Time 8    Period Weeks    Status Partially Met      PT LONG TERM GOAL #5   Title Pt will perform TUG in 14.5 seconds or less with rollator vs. LRAD in order to demo decr fall risk.    Baseline 16.94 seconds with rollator.    Time 8    Period Weeks    Status New                 Plan - 12/19/20 1706    Clinical Impression Statement Today's skilled session focused on beginning to check pt's LTGs. Pt did not meet LTG #2, BERG score today improved to a 41/56 but not quite to goal level. Pt able to ambulate indoor distances with SPC with quad tip and min guard/supervision. Assessed pt's gait speed with cane today and was 1.92 ft/sec, indicating a limited community ambulator. Pt with incr difficulty with balance with more narrow BOS and on compliant surfaces (esp with head motions). Will assess remaining LTGs  at next session and perform re-cert.    Personal Factors and Comorbidities Comorbidity 3+;Past/Current Experience    Comorbidities HTN, CAD, COPD, A-fib, hx of COVID march 2021    Examination-Activity Limitations Stand;Squat;Transfers;Reach Overhead;Locomotion Level    Examination-Participation Restrictions Community Activity;Driving    Stability/Clinical Decision Making Evolving/Moderate complexity    Rehab Potential Good    PT Frequency 2x / week    PT Duration 8 weeks    PT Treatment/Interventions ADLs/Self Care Home Management;DME Instruction;Gait training;Functional mobility training;Stair training;Neuromuscular re-education;Balance training;Therapeutic exercise;Therapeutic activities;Patient/family education;Vestibular;Energy conservation    PT Next Visit Plan check remainder of LTGs and perform re-cert. upgrade HEP as appropriate. work on functional BLE strengthening, balance with narrow BOS and SLS, compliant surfaces.    Consulted and Agree with Plan of Care Patient           Patient will benefit from skilled therapeutic intervention in order to improve the following deficits and impairments:  Abnormal gait,Decreased activity tolerance,Decreased balance,Decreased endurance,Decreased coordination,Decreased cognition,Decreased knowledge of use of DME,Decreased range of motion,Difficulty walking,Decreased safety awareness,Decreased strength  Visit Diagnosis: Other lack of coordination  Muscle weakness (generalized)  Other abnormalities of gait and mobility  Unsteadiness on feet     Problem List Patient Active Problem List   Diagnosis Date Noted  . AAA (abdominal aortic aneurysm) without rupture ([place]) 11/14/2020  . Subdural hematoma ([place]) 08/15/2020  . Iron deficiency anemia due to chronic blood loss 01/06/2019  . Orthostatic hypotension 01/05/2019    Class: Question of  . Normocytic normochromic anemia 01/05/2019  . Near syncope 01/05/2019  . Chronic respiratory failure  with hypoxia ([place]) 06/30/2018  . OSA (obstructive sleep apnea) 02/11/2018  . Physical deconditioning 08/11/2017  . Shortness of breath 10/24/2014  . COPD (chronic obstructive pulmonary disease) with emphysema ([place]) 09/01/2014  . Fatigue 10/14/2011  . Atrial fibrillation ([place]) 05/15/2011  . Premature ventricular contractions (PVCs) (V[facility]) 02/25/2011  . 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  . PERSISTENT DISORDER INITIATING/MAINTAINING SLEEP 05/10/2010  . HYPERCHOLESTEROLEMIA 05/09/2010  . GOUT 05/09/2010  . OBESITY 05/09/2010  . Obstructive sleep apnea 05/09/2010  . Essential hypertension 05/09/2010  . VENTRICULAR TACHYCARDIA 05/09/2010    Arliss Journey, PT, DPT  12/19/2020, 5:09 PM  [facility] [address] [place], [place], [address] Phone: [phone]   Fax:  [phone]  Name: [name] MRN: [id number] Date of Birth: 1934/04/17

## 2020-12-21 ENCOUNTER — Ambulatory Visit: Payer: Medicare Other

## 2020-12-21 ENCOUNTER — Encounter: Payer: Self-pay | Admitting: Occupational Therapy

## 2020-12-21 ENCOUNTER — Other Ambulatory Visit: Payer: Self-pay

## 2020-12-21 ENCOUNTER — Ambulatory Visit: Payer: Medicare Other | Admitting: Occupational Therapy

## 2020-12-21 DIAGNOSIS — R41840 Attention and concentration deficit: Secondary | ICD-10-CM

## 2020-12-21 DIAGNOSIS — M6281 Muscle weakness (generalized): Secondary | ICD-10-CM

## 2020-12-21 DIAGNOSIS — R2681 Unsteadiness on feet: Secondary | ICD-10-CM

## 2020-12-21 DIAGNOSIS — R278 Other lack of coordination: Secondary | ICD-10-CM

## 2020-12-21 DIAGNOSIS — R41844 Frontal lobe and executive function deficit: Secondary | ICD-10-CM

## 2020-12-21 DIAGNOSIS — R41841 Cognitive communication deficit: Secondary | ICD-10-CM

## 2020-12-21 DIAGNOSIS — R2689 Other abnormalities of gait and mobility: Secondary | ICD-10-CM

## 2020-12-21 NOTE — Therapy (Signed)
[facility] [address] [place], [place], [address] Phone: [phone]   Fax:  [phone]  Occupational Therapy Treatment  Patient Details  Name: [name] MRN: [id number] Date of Birth: [birth year] Referring Provider (OT): Dr. Brett Fairy (referred by hospitalist with send to neurology)   Encounter Date: 12/21/2020   OT End of Session - 12/21/20 1619    Visit Number 12    Number of Visits 25    Date for OT Re-Evaluation 01/24/21    Authorization Type UHC Medicare    Authorization Time Period 90 days, anticipate d/c after 8 weeks . Week 6 (12/19/20)    Authorization - Visit Number 12    Authorization - Number of Visits 20    OT Start Time 3220    OT Stop Time 1700    OT Time Calculation (min) 41 min    Activity Tolerance Patient tolerated treatment well    Behavior During Therapy W[facility] for tasks assessed/performed           Past Medical History:  Diagnosis Date  . Afib ([place])   . Arthritis   . BPH (benign prostatic hyperplasia)   . COPD (chronic obstructive pulmonary disease) ([place])   . Coronary artery disease   . Coronary artery ectasia    Mild CAD with normal systolic function per cath in August of 2011  . Depression   . Diaphragmatic paralysis    felt to be partially responsible for dyspnea  . Dyspnea    due to paralyzed diaphragm and pulmonary issues  . Dysrhythmia   . GERD (gastroesophageal reflux disease)   . Gout   . HTN (hypertension)   . Hypercholesteremia   . Hypertension   . Iron deficiency anemia   . Microhematuria   . Obesity   . OSA (obstructive sleep apnea)    CPAP  . Paroxysmal atrial fibrillation (H[facility])    occured in the setting of acute E Coli sepsis and ileius 7/12  . PVC (premature ventricular contraction)    s/p PVC ablation 05/29/2010    Past Surgical History:  Procedure Laterality Date  . APPENDECTOMY  1978  . BIOPSY  02/09/2019   Procedure: BIOPSY;  Surgeon: Clarene Essex,  MD;  [facility];  Service: Endoscopy;;  . BIOPSY  03/02/2019   Procedure: BIOPSY;  Surgeon: Clarene Essex, MD;  [facility];  Service: Endoscopy;;  . CARDIAC CATHETERIZATION  04-30-2010   Left main coronary artery is normal.   . CARDIOVASCULAR STRESS TEST  03-19-2010   0%  . CHOLECYSTECTOMY  1990  . COLONOSCOPY N/A 03/02/2019   Procedure: COLONOSCOPY;  Surgeon: Clarene Essex, MD;  [facility];  Service: Endoscopy;  Laterality: N/A;  . COLONOSCOPY W/ POLYPECTOMY    . ESOPHAGOGASTRODUODENOSCOPY (EGD) WITH PROPOFOL N/A 02/09/2019   Procedure: ESOPHAGOGASTRODUODENOSCOPY (EGD) WITH PROPOFOL;  Surgeon: Clarene Essex, MD;  [facility];  Service: Endoscopy;  Laterality: N/A;  . EYE SURGERY Right    catarct  . poly removed from nose as a child    . PVC ablation  05/29/2010  . RADIOLOGY WITH ANESTHESIA N/A 11/07/2020   Procedure: MRV HEAD WITH AND WITHOUT;  Surgeon: Radiologist, Medication, MD;  Location: [facility];  Service: Radiology;  Laterality: N/A;  . RADIOLOGY WITH ANESTHESIA N/A 11/07/2020   Procedure: CT LUMBAR SPINE WITHOUT;  Surgeon: Radiologist, Medication, MD;  Location: [facility];  Service: Radiology;  Laterality: N/A;  . US ECHOCARDIOGRAPHY  03-09-2010   EF 55-60%  There were no vitals filed for this visit.   Subjective Assessment - 12/21/20 1620    Subjective  Pt denies any pain. Pt reports "everything is a challenge but I do everything myself"    Pertinent History Pt had a fall while shopping in [employer] on 08/15/20. Pt sustained a parafalcine and tentorial SDH. Was discharged from the hospital on 08/17/20.PMH:HTN, CAD, COPD, A-fib    Patient Stated Goals improve balance and strength    Currently in Pain? No/denies                 TREATMENT  PVC pipe tree with copying pattern with min verbal and visual cues. Good bimanual coordination this day. Pt did 2 figures of higher level complexity. Pt copied patterns with little difficulty.   Cane  Exercises-standing see pt instructions             OT Education - 12/21/20 1654    Education Details standing cane exercises - see pt instructions    Person(s) Educated Patient;Spouse    Methods Explanation;Demonstration;Verbal cues;Handout    Comprehension Verbalized understanding;Returned demonstration;Verbal cues required            OT Short Term Goals - 12/14/20 1620      OT SHORT TERM GOAL #1   Title Pt/ caregeiver will be I with HEP. 11/29/20    Time 4    Period Weeks    Status Achieved   met, for coordination HEP   Target Date 11/29/20      OT SHORT TERM GOAL #2   Title Pt / wife will verbalize understanding of strategies to increase safety and I with ADLS.    Time 4    Period Weeks    Status Achieved      OT SHORT TERM GOAL #3   Title Pt will perfrom transitional movments for ADLS with close supervision and no LOB.    Baseline histroy of falls    Time 4    Period Weeks    Status Achieved      OT SHORT TERM GOAL #4   Title Pt will demonstrate improved fine motor coordination in his LUE as eveidenced by decreasing 9-hole peg test score by 4 secs. UPDATE: complete in 35 seconds or less with LUE    Baseline 45.72 LUE at eval    Time 4    Period Weeks    Status Revised   39.46s with LUE 12/14/2020     OT SHORT TERM GOAL #5   Title Pt will sequence a simple functional or ADL task  with 90% or better accuracy .    Time 4    Period Weeks    Status Achieved   93% on constant therapy     OT SHORT TERM GOAL #6   Title Pt will demonstrate improved fine motor coordination for ADLs as evidenced by decreasing 3 button/ unbutton time to 90 secs or less.    Time 4    Period Weeks    Status Achieved   49.25            OT Long Term Goals - 12/14/20 1621      OT LONG TERM GOAL #1   Title Pt/ wife will be I with updated HEP for proximal strength.    Time 12    Period Weeks    Status On-going      OT LONG TERM GOAL #2   Title Pt will demonstrate ability to  perform simple beverage /  snack prep modifeied indpendent demonstraing good saftey awareness.    Time 12    Period Weeks    Status Achieved   pt reports doing this at home     OT LONG TERM GOAL #3   Title Pt will perforrm bathing and  dressing mod I in a reasonable amout of time    Baseline increased time required    Time 12    Period Weeks    Status Achieved   per pt report     OT LONG TERM GOAL #4   Title Pt will demonstrate adequate bilateral UE strength to retrieve a lightweight object (3 lbs) from eye level  shelf without dropping with right and left UE's individually.    Time 12    Period Weeks    Status Achieved      OT LONG TERM GOAL #5   Title Pt will increase standing functional reach test score to 10 inches or greater with RUE to minimize fall risk during ADLs.    Time 12    Period Weeks    Status On-going   6"     OT LONG TERM GOAL #6   Title Pt will perform physical and cognitive task with 90% accuracy    Time 12    Period Weeks    Status New                 Plan - 12/21/20 1640    Clinical Impression Statement Pt progressing towards unmet goals.    OT Occupational Profile and History Detailed Assessment- Review of Records and additional review of physical, cognitive, psychosocial history related to current functional performance    Occupational performance deficits (Please refer to evaluation for details): ADL's;IADL's;Leisure;Social Participation    Body Structure / Function / Physical Skills ADL;UE functional use;Balance;FMC;GMC;Coordination;Decreased knowledge of use of DME;IADL;Dexterity;Strength;Mobility    Cognitive Skills Memory;Safety Awareness;Problem Solve;Sequencing;Thought;Understand;Attention    OT Frequency 2x / week    OT Duration 12 weeks   12 weeks total. renewal completed at week 5/visit 10 to address goals.   OT Treatment/Interventions Self-care/ADL training;Energy conservation;Patient/family education;DME and/or AE instruction;Aquatic  Therapy;Paraffin;Passive range of motion;Balance training;Fluidtherapy;Cryotherapy;Therapist, nutritional;Therapeutic activities;Manual Therapy;Therapeutic exercise;Moist Heat;Neuromuscular education;Cognitive remediation/compensation    Plan standing activities for dynamic balance/functional reach, alternating attention/dual task, review cane exercises standing    Consulted and Agree with Plan of Care Patient;Family member/caregiver           Patient will benefit from skilled therapeutic intervention in order to improve the following deficits and impairments:   Body Structure / Function / Physical Skills: ADL,UE functional use,Balance,FMC,GMC,Coordination,Decreased knowledge of use of DME,IADL,Dexterity,Strength,Mobility Cognitive Skills: Memory,Safety Awareness,Problem Solve,Sequencing,Thought,Understand,Attention     Visit Diagnosis: Attention and concentration deficit  Other lack of coordination  Frontal lobe and executive function deficit  Muscle weakness (generalized)  Unsteadiness on feet    Problem List Patient Active Problem List   Diagnosis Date Noted  . AAA (abdominal aortic aneurysm) without rupture ([place]) 11/14/2020  . Subdural hematoma ([place]) 08/15/2020  . Iron deficiency anemia due to chronic blood loss 01/06/2019  . Orthostatic hypotension 01/05/2019    Class: Question of  . Normocytic normochromic anemia 01/05/2019  . Near syncope 01/05/2019  . Chronic respiratory failure with hypoxia ([place]) 06/30/2018  . OSA (obstructive sleep apnea) 02/11/2018  . Physical deconditioning 08/11/2017  . Shortness of breath 10/24/2014  . COPD (chronic obstructive pulmonary disease) with emphysema ([place]) 09/01/2014  . Fatigue 10/14/2011  . Atrial fibrillation ([place]) 05/15/2011  . Premature  ventricular contractions (PVCs) ([facility]) 02/25/2011  . 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  . PERSISTENT DISORDER INITIATING/MAINTAINING SLEEP 05/10/2010  . HYPERCHOLESTEROLEMIA 05/09/2010   . GOUT 05/09/2010  . OBESITY 05/09/2010  . Obstructive sleep apnea 05/09/2010  . Essential hypertension 05/09/2010  . VENTRICULAR TACHYCARDIA 05/09/2010    [name] MOT, OTR/L  12/21/2020, 5:00 PM  [facility] [address] [place], [place], [address] Phone: [phone]   Fax:  [phone]  Name: [name] MRN: [id number] Date of Birth: 01-03-1934

## 2020-12-21 NOTE — Patient Instructions (Signed)
Cane Majorette - Standing    Holding cane majorette style down across trunk, raise toward ceiling. Repeat ___ times. Reverse hand placement, repeat to other side. Do ___ times per day.  Cane Overhead - Standing    With arms straight, hold cane forward at waist. Raise cane above head. Hold ___ seconds. Repeat ___ times. Do ___ times per day.  Cane Exercise: Extension    Stand holding cane behind back with both hands palm-up. Lift the cane away from body. Hold ____ seconds. Repeat ____ times. Do ____ sessions per day.  Supine: Chest Press (Active)    In standing - with arms fully extended. Bring bar or dowel slowly to belly button and press to arm's length.  Complete _2-3_ sets of _10-12__ repetitions.   Copyright  VHI. All rights reserved.

## 2020-12-21 NOTE — Patient Instructions (Signed)
Continue your homework 30 minutes/day

## 2020-12-21 NOTE — Therapy (Signed)
[name], [name], [name] Phone: [phone]   Fax:  [phone]  Speech Language Pathology Treatment  Patient Details  Name: [name] MRN: [id number] Date of Birth: April 18, 1934 Referring Provider (SLP): Margie Billet, PA-C; Dr. [name] PCP (doc)   Encounter Date: 12/21/2020   End of Session - 12/21/20 1645    Visit Number 8    Number of Visits 17    Date for SLP Re-Evaluation 01/29/21    SLP Start Time 50    SLP Stop Time  1615    SLP Time Calculation (min) 41 min    Activity Tolerance Patient tolerated treatment well           Past Medical History:  Diagnosis Date  . Afib ([place])   . Arthritis   . BPH (benign prostatic hyperplasia)   . COPD (chronic obstructive pulmonary disease) ([place])   . Coronary artery disease   . Coronary artery ectasia    Mild CAD with normal systolic function per cath in August of 2011  . Depression   . Diaphragmatic paralysis    felt to be partially responsible for dyspnea  . Dyspnea    due to paralyzed diaphragm and pulmonary issues  . Dysrhythmia   . GERD (gastroesophageal reflux disease)   . Gout   . HTN (hypertension)   . Hypercholesteremia   . Hypertension   . Iron deficiency anemia   . Microhematuria   . Obesity   . OSA (obstructive sleep apnea)    CPAP  . Paroxysmal atrial fibrillation (HCC)    occured in the setting of acute E Coli sepsis and ileius 7/12  . PVC (premature ventricular contraction)    s/p PVC ablation 05/29/2010    Past Surgical History:  Procedure Laterality Date  . APPENDECTOMY  1978  . BIOPSY  02/09/2019   Procedure: BIOPSY;  Surgeon: Clarene Essex, MD;  Location: [facility];  Service: Endoscopy;;  . BIOPSY  03/02/2019   Procedure: BIOPSY;  Surgeon: Clarene Essex, MD;  Location: [facility];  Service: Endoscopy;;  . CARDIAC CATHETERIZATION  04-30-2010   Left main coronary artery is normal.   .  CARDIOVASCULAR STRESS TEST  03-19-2010   0%  . CHOLECYSTECTOMY  1990  . COLONOSCOPY N/A 03/02/2019   Procedure: COLONOSCOPY;  Surgeon: Clarene Essex, MD;  [facility];  Service: Endoscopy;  Laterality: N/A;  . COLONOSCOPY W/ POLYPECTOMY    . ESOPHAGOGASTRODUODENOSCOPY (EGD) WITH PROPOFOL N/A 02/09/2019   Procedure: ESOPHAGOGASTRODUODENOSCOPY (EGD) WITH PROPOFOL;  Surgeon: Clarene Essex, MD;  [facility];  Service: Endoscopy;  Laterality: N/A;  . EYE SURGERY Right    catarct  . poly removed from nose as a child    . PVC ablation  05/29/2010  . RADIOLOGY WITH ANESTHESIA N/A 11/07/2020   Procedure: MRV HEAD WITH AND WITHOUT;  Surgeon: Radiologist, Medication, MD;  Location: [facility];  Service: Radiology;  Laterality: N/A;  . RADIOLOGY WITH ANESTHESIA N/A 11/07/2020   Procedure: CT LUMBAR SPINE WITHOUT;  Surgeon: Radiologist, Medication, MD;  Location: [facility];  Service: Radiology;  Laterality: N/A;  . US ECHOCARDIOGRAPHY  03-09-2010   EF 55-60%    There were no vitals filed for this visit.   Subjective Assessment - 12/21/20 1548    Subjective "You said to do the homework 30 minutes a day"    Currently in Pain? No/denies  ADULT SLP TREATMENT - 12/21/20 1552      General Information   Behavior/Cognition Alert;Cooperative;Pleasant mood      Treatment Provided   Treatment provided Cognitive-Linquistic      Cognitive-Linquistic Treatment   Treatment focused on Cognition    Skilled Treatment SLP assisted pt with detailed written directions. SLP req'd to provide mod-max cues for a novel task. Pt with decr'd alternating attention and emergent awareness as he did not complete two items, without knowledge. In simple-mod complex conversation pt held topic with SLP for 7 minutes demonstrating sustained atention functional for conversation. With a functional task pt demonstrated atteniton to detail with extra time and mod cues Development worker, international aid). Pt stated, "Yah  I wonder if this will get any better." (demonstrating intellectual awareness). SLP encouarged pt to cont to work and do what he's asked by therapists for best possible outcome.      Assessment / Recommendations / Plan   Plan Continue with current plan of care      Progression Toward Goals   Progression toward goals Not progressing toward goals (comment)   severity; abilities appear to fluctuate at times from session to session             SLP Short Term Goals - 12/21/20 [place] #1   Title pt will perform functional reading and/or written tasks for 7 minutes in 2 sessions    Baseline 12-19-20    Status Achieved      SLP SHORT TERM GOAL #2   Title pt will process verbal or written directions with 80% accuracy with occasional min A x3 vistis    Baseline 12-05-20, 12-12-20    Status Achieved      SLP SHORT TERM GOAL #3   Title pt will verbally state 3 strategies to improve memory skills in 3 sessions    Baseline 12-12-20 (phone alarm), 12-19-20 (visual cue - blue folder)    Status Partially Met      SLP SHORT TERM GOAL #4   Title pt will demo understanding of his non-physical deficits folloiwng brain bleed with modified independence in 3 sessions    Baseline 12-12-20, 12-21-20    Status Partially Met            SLP Long Term Goals - 12/21/20 1647      SLP LONG TERM GOAL #1   Title Pt will use memory compensation system for daily tasks when appropriate, x1 in 4 sessions    Baseline 12-19-20 (PT HEP),    Time 5    Period Weeks   or 17 visits, for all LTGs   Status On-going      SLP LONG TERM GOAL #2   Title pt will demonstrate error awareness in linguistic tasks 90% of the time with nonverbal cues in 3 sessions    Time 5    Period Weeks    Status On-going      SLP LONG TERM GOAL #3   Title pt and/or wife will report pt has completed tasks demonstrating ability to solve problems in the home environment x3 sessions    Time 5    Period Weeks    Status  On-going      SLP LONG TERM GOAL #4   Title pt will show knowledge of compensating for cognitive deficits in linguistic tasks    Time 5    Period Weeks    Status On-going  Plan - 12/21/20 1646    Clinical Impression Statement [name] presents today with cognitive communication deficits in areas of attention, problem solving, processing, awareness, and memory. Awareness of deficits continues to slowly improve. SLP has not noted overt s/sx aphasia to date. SLP believes pt will benefit from skilled ST tageting these cognitive deficits and assist pt to regain his PLOF.    Speech Therapy Frequency 2x / week    Duration 8 weeks   or 17 total sessions   Treatment/Interventions Language facilitation;Compensatory techniques;Internal/external aids;SLP instruction and feedback;Patient/family education;Environmental controls;Cognitive reorganization    Potential to Achieve Goals Good    Consulted and Agree with Plan of Care Patient;Family member/caregiver           Patient will benefit from skilled therapeutic intervention in order to improve the following deficits and impairments:   Cognitive communication deficit    Problem List Patient Active Problem List   Diagnosis Date Noted  . AAA (abdominal aortic aneurysm) without rupture ([place]) 11/14/2020  . Subdural hematoma ([place]) 08/15/2020  . Iron deficiency anemia due to chronic blood loss 01/06/2019  . Orthostatic hypotension 01/05/2019    Class: Question of  . Normocytic normochromic anemia 01/05/2019  . Near syncope 01/05/2019  . Chronic respiratory failure with hypoxia ([place]) 06/30/2018  . OSA (obstructive sleep apnea) 02/11/2018  . Physical deconditioning 08/11/2017  . Shortness of breath 10/24/2014  . COPD (chronic obstructive pulmonary disease) with emphysema ([place]) 09/01/2014  . Fatigue 10/14/2011  . Atrial fibrillation ([place]) 05/15/2011  . Premature ventricular contractions (PVCs) ([facility]) 02/25/2011  .  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  . PERSISTENT DISORDER INITIATING/MAINTAINING SLEEP 05/10/2010  . HYPERCHOLESTEROLEMIA 05/09/2010  . GOUT 05/09/2010  . OBESITY 05/09/2010  . Obstructive sleep apnea 05/09/2010  . Essential hypertension 05/09/2010  . VENTRICULAR TACHYCARDIA 05/09/2010    [facility] ,MS, CCC-SLP  12/21/2020, 4:48 PM  [facility] [address] [place], [name], [address] Phone: [phone]   Fax:  [phone]   Name: [name] MRN: [id number] Date of Birth: 1934/01/28

## 2020-12-22 NOTE — Therapy (Signed)
[facility] [address] [place], [place], [address] Phone: [phone]   Fax:  [phone]  Physical Therapy Treatment  Patient Details  Name: [name] MRN: [id number] Date of Birth: Apr 19, 1934 Referring Provider (PT): Dohmeier, Asencion Partridge, MD (is being followed by, was referred by hospitalist)   Encounter Date: 12/21/2020   PT End of Session - 12/21/20 0820    Visit Number 15    Number of Visits 17    Date for PT Re-Evaluation 01/22/21    Authorization Type [employer] Medicare    Progress Note Due on Visit 20    PT Start Time 1700    PT Stop Time 1745    PT Time Calculation (min) 45 min    Equipment Utilized During Treatment Gait belt    Activity Tolerance Patient tolerated treatment well    Behavior During Therapy [facility] for tasks assessed/performed           Past Medical History:  Diagnosis Date  . Afib ([place])   . Arthritis   . BPH (benign prostatic hyperplasia)   . COPD (chronic obstructive pulmonary disease) ([place])   . Coronary artery disease   . Coronary artery ectasia    Mild CAD with normal systolic function per cath in August of 2011  . Depression   . Diaphragmatic paralysis    felt to be partially responsible for dyspnea  . Dyspnea    due to paralyzed diaphragm and pulmonary issues  . Dysrhythmia   . GERD (gastroesophageal reflux disease)   . Gout   . HTN (hypertension)   . Hypercholesteremia   . Hypertension   . Iron deficiency anemia   . Microhematuria   . Obesity   . OSA (obstructive sleep apnea)    CPAP  . Paroxysmal atrial fibrillation (H[facility])    occured in the setting of acute E Coli sepsis and ileius 7/12  . PVC (premature ventricular contraction)    s/p PVC ablation 05/29/2010    Past Surgical History:  Procedure Laterality Date  . APPENDECTOMY  1978  . BIOPSY  02/09/2019   Procedure: BIOPSY;  Surgeon: Clarene Essex, MD;  [facility];  Service: Endoscopy;;  . BIOPSY  03/02/2019    Procedure: BIOPSY;  Surgeon: Clarene Essex, MD;  [facility];  Service: Endoscopy;;  . CARDIAC CATHETERIZATION  04-30-2010   Left main coronary artery is normal.   . CARDIOVASCULAR STRESS TEST  03-19-2010   0%  . CHOLECYSTECTOMY  1990  . COLONOSCOPY N/A 03/02/2019   Procedure: COLONOSCOPY;  Surgeon: Clarene Essex, MD;  [facility];  Service: Endoscopy;  Laterality: N/A;  . COLONOSCOPY W/ POLYPECTOMY    . ESOPHAGOGASTRODUODENOSCOPY (EGD) WITH PROPOFOL N/A 02/09/2019   Procedure: ESOPHAGOGASTRODUODENOSCOPY (EGD) WITH PROPOFOL;  Surgeon: Clarene Essex, MD;  [facility];  Service: Endoscopy;  Laterality: N/A;  . EYE SURGERY Right    catarct  . poly removed from nose as a child    . PVC ablation  05/29/2010  . RADIOLOGY WITH ANESTHESIA N/A 11/07/2020   Procedure: MRV HEAD WITH AND WITHOUT;  Surgeon: Radiologist, Medication, MD;  Location: [facility];  Service: Radiology;  Laterality: N/A;  . RADIOLOGY WITH ANESTHESIA N/A 11/07/2020   Procedure: CT LUMBAR SPINE WITHOUT;  Surgeon: Radiologist, Medication, MD;  Location: [facility];  Service: Radiology;  Laterality: N/A;  . US ECHOCARDIOGRAPHY  03-09-2010   EF 55-60%    There were no vitals filed for this visit.   Subjective Assessment - 12/21/20  0820    Subjective No falls or med changes to note, arrives with cane today    Patient is accompained by: Family member   wife, [name]   Pertinent History HTN, CAD, COPD, A-fib    Limitations Standing;Walking    How long can you sit comfortably? 30'    How long can you stand comfortably? 36'    How long can you walk comfortably? 106'    Patient Stated Goals wants to improve his balance    Pain Onset Today             12/21/20 0001  Balance Exercises: Standing  SLS with Vectors Foam/compliant surface;Intermittent upper extremity assist;Limitations  SLS with Vectors Limitations in //bars 10 taps to floor targets placed in front of ea. LE with no UE support at times  Stepping Strategy  Anterior;Lateral;10 reps;Limitations  Stepping Strategy Limitations step taps from solid surface to 6" block  Rockerboard Anterior/posterior;Lateral;30 seconds;Intermittent UE support;Limitations  Rockerboard Limitations 30s hold x2 with intermittent UE support,  Tandem Gait Forward;5 reps;Intermittent upper extremity support;Foam/compliant surface;Limitations  Tandem Gait Limitations 5 trips in // bars  Other Standing Exercises Comments runners step from airex 1x10       12/21/20 0001  Transfers  Transfers Sit to Stand  Sit to Stand 5: Supervision  Ambulation/Gait  Ambulation/Gait Assistance 4: Min guard;5: Supervision  Ambulation/Gait Assistance Details indoor  Ambulation Distance (Feet) 230 Feet  Assistive device Straight cane  Gait Pattern Step-through pattern;Decreased step length - right;Decreased step length - left  Ambulation Surface Level;Indoor  Knee/Hip Exercises: Aerobic  Nustep L3 8' arms 10                            PT Short Term Goals - 11/28/20 1455      PT SHORT TERM GOAL #1   Title Pt will initiate HEP in order to indicate improved functional mobility and decreased fall risk.  ALL STGS DUE 11/21/20    Baseline reviewed HEP on 11/28/20    Time 4    Period Weeks    Status Achieved    Target Date 11/21/20      PT SHORT TERM GOAL #2   Title Pt will perform 5x sit <> stand in 17 seconds or less with BUE support in order to demo improved functional strength.    Baseline 19.31 seconds, 10.97 seconds with BUE support on 11/28/20    Time 4    Period Weeks    Status Achieved      PT SHORT TERM GOAL #3   Title Pt will undergo BERG with STG and LTG written as appropriate.    Baseline 10/27/20 BERG 37/56    Time 4    Period Weeks    Status Achieved      PT SHORT TERM GOAL #4   Title Pt will improve gait speed with rollator to at least 2.5 ft/sec in order demo improved community mobility.    Baseline 2.18 ft/sec, 14.41 seconds = 2.27 ft/sec  on 3/22    Time 4    Period Weeks    Status Not Met      PT SHORT TERM GOAL #5   Title Pt and pt's spouse will verbalize understanding of fall prevention in the home to decr fall risk.    Time 4    Period Weeks    Status New             PT Long  Term Goals - 12/19/20 1631      PT LONG TERM GOAL #1   Title Pt will be independent with final HEP in order to indicate improved functional mobility and decreased fall risk.  ALL LTGS DUE 12/19/20    Time 8    Period Weeks    Status New      PT LONG TERM GOAL #2   Title BERG goal to be written as appropriate to determine decr fall risk.  BERG goal is 45/56    Baseline 37; 41/56 on 12/19/20    Time 8    Period Weeks    Status Not Met      PT LONG TERM GOAL #3   Title Pt will perform 5x sit <> stand in 14.5 seconds or less with BUE vs. single UE support in order to demo improved functional strength.    Baseline 19.31 seconds; 10.97 seconds with BUE support on 11/28/20    Time 8    Period Weeks    Status Achieved      PT LONG TERM GOAL #4   Title Pt will ambulate at least 150' over indoor surfaces with cane vs. LRAD with supervision in order to demo improved household mobility.    Baseline supervision/min guard with SPC with quad tip on 12/19/20    Time 8    Period Weeks    Status Partially Met      PT LONG TERM GOAL #5   Title Pt will perform TUG in 14.5 seconds or less with rollator vs. LRAD in order to demo decr fall risk.    Baseline 16.94 seconds with rollator.    Time 8    Period Weeks    Status New                  Patient will benefit from skilled therapeutic intervention in order to improve the following deficits and impairments:     Visit Diagnosis: Unsteadiness on feet  Muscle weakness (generalized)  Other abnormalities of gait and mobility     Problem List Patient Active Problem List   Diagnosis Date Noted  . AAA (abdominal aortic aneurysm) without rupture ([place]) 11/14/2020  . Subdural hematoma  ([place]) 08/15/2020  . Iron deficiency anemia due to chronic blood loss 01/06/2019  . Orthostatic hypotension 01/05/2019    Class: Question of  . Normocytic normochromic anemia 01/05/2019  . Near syncope 01/05/2019  . Chronic respiratory failure with hypoxia ([place]) 06/30/2018  . OSA (obstructive sleep apnea) 02/11/2018  . Physical deconditioning 08/11/2017  . Shortness of breath 10/24/2014  . COPD (chronic obstructive pulmonary disease) with emphysema ([place]) 09/01/2014  . Fatigue 10/14/2011  . Atrial fibrillation ([place]) 05/15/2011  . Premature ventricular contractions (PVCs) ([facility]) 02/25/2011  . 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  . PERSISTENT DISORDER INITIATING/MAINTAINING SLEEP 05/10/2010  . HYPERCHOLESTEROLEMIA 05/09/2010  . GOUT 05/09/2010  . OBESITY 05/09/2010  . Obstructive sleep apnea 05/09/2010  . Essential hypertension 05/09/2010  . VENTRICULAR TACHYCARDIA 05/09/2010    Lanice Shirts 12/22/2020, 8:25 AM  [facility] [address] [place], [place], [address] Phone: [phone]   Fax:  [phone]  Name: [name] MRN: [id number] Date of Birth: [birth year]

## 2021-01-02 ENCOUNTER — Encounter: Payer: Self-pay | Admitting: Physical Therapy

## 2021-01-02 ENCOUNTER — Ambulatory Visit: Payer: Medicare Other | Admitting: Physical Therapy

## 2021-01-02 ENCOUNTER — Ambulatory Visit: Payer: Medicare Other | Admitting: Occupational Therapy

## 2021-01-02 ENCOUNTER — Ambulatory Visit: Payer: Medicare Other

## 2021-01-02 ENCOUNTER — Other Ambulatory Visit: Payer: Self-pay

## 2021-01-02 DIAGNOSIS — R2681 Unsteadiness on feet: Secondary | ICD-10-CM | POA: Diagnosis not present

## 2021-01-02 DIAGNOSIS — R41840 Attention and concentration deficit: Secondary | ICD-10-CM

## 2021-01-02 DIAGNOSIS — M6281 Muscle weakness (generalized): Secondary | ICD-10-CM

## 2021-01-02 DIAGNOSIS — R278 Other lack of coordination: Secondary | ICD-10-CM

## 2021-01-02 DIAGNOSIS — R2689 Other abnormalities of gait and mobility: Secondary | ICD-10-CM

## 2021-01-02 DIAGNOSIS — R41844 Frontal lobe and executive function deficit: Secondary | ICD-10-CM

## 2021-01-02 DIAGNOSIS — R41841 Cognitive communication deficit: Secondary | ICD-10-CM

## 2021-01-02 NOTE — Therapy (Signed)
[facility] [address] [place], [place], [address] Phone: [phone]   Fax:  [phone]  Physical Therapy Treatment/Re-Cert  Patient Details  Name: [name] MRN: [id number] Date of Birth: 31-Mar-1934 Referring Provider (PT): Dohmeier, Asencion Partridge, MD   Encounter Date: 01/02/2021   PT End of Session - 01/02/21 1701    Visit Number 16    Number of Visits 23    Date for PT Re-Evaluation 18/84/16   from re-cert on 02/12/29   Authorization Type [employer] Medicare    Progress Note Due on Visit 20    PT Start Time 1615    PT Stop Time 1656    PT Time Calculation (min) 41 min    Equipment Utilized During Treatment Gait belt    Activity Tolerance Patient tolerated treatment well    Behavior During Therapy WFL for tasks assessed/performed           Past Medical History:  Diagnosis Date  . Afib ([place])   . Arthritis   . BPH (benign prostatic hyperplasia)   . COPD (chronic obstructive pulmonary disease) ([place])   . Coronary artery disease   . Coronary artery ectasia    Mild CAD with normal systolic function per cath in August of 2011  . Depression   . Diaphragmatic paralysis    felt to be partially responsible for dyspnea  . Dyspnea    due to paralyzed diaphragm and pulmonary issues  . Dysrhythmia   . GERD (gastroesophageal reflux disease)   . Gout   . HTN (hypertension)   . Hypercholesteremia   . Hypertension   . Iron deficiency anemia   . Microhematuria   . Obesity   . OSA (obstructive sleep apnea)    CPAP  . Paroxysmal atrial fibrillation (HCC)    occured in the setting of acute E Coli sepsis and ileius 7/12  . PVC (premature ventricular contraction)    s/p PVC ablation 05/29/2010    Past Surgical History:  Procedure Laterality Date  . APPENDECTOMY  1978  . BIOPSY  02/09/2019   Procedure: BIOPSY;  Surgeon: Clarene Essex, MD;  Location: [facility];  Service: Endoscopy;;  . BIOPSY  03/02/2019   Procedure:  BIOPSY;  Surgeon: Clarene Essex, MD;  Location: [facility];  Service: Endoscopy;;  . CARDIAC CATHETERIZATION  04-30-2010   Left main coronary artery is normal.   . CARDIOVASCULAR STRESS TEST  03-19-2010   0%  . CHOLECYSTECTOMY  1990  . COLONOSCOPY N/A 03/02/2019   Procedure: COLONOSCOPY;  Surgeon: Clarene Essex, MD;  [facility];  Service: Endoscopy;  Laterality: N/A;  . COLONOSCOPY W/ POLYPECTOMY    . ESOPHAGOGASTRODUODENOSCOPY (EGD) WITH PROPOFOL N/A 02/09/2019   Procedure: ESOPHAGOGASTRODUODENOSCOPY (EGD) WITH PROPOFOL;  Surgeon: Clarene Essex, MD;  [facility];  Service: Endoscopy;  Laterality: N/A;  . EYE SURGERY Right    catarct  . poly removed from nose as a child    . PVC ablation  05/29/2010  . RADIOLOGY WITH ANESTHESIA N/A 11/07/2020   Procedure: MRV HEAD WITH AND WITHOUT;  Surgeon: Radiologist, Medication, MD;  Location: [facility];  Service: Radiology;  Laterality: N/A;  . RADIOLOGY WITH ANESTHESIA N/A 11/07/2020   Procedure: CT LUMBAR SPINE WITHOUT;  Surgeon: Radiologist, Medication, MD;  Location: [facility];  Service: Radiology;  Laterality: N/A;  . US ECHOCARDIOGRAPHY  03-09-2010   EF 55-60%    There were no vitals filed for this visit.   Subjective Assessment - 01/02/21 1616  Subjective No falls. Has been doing his exercises everyday.    Patient is accompained by: Family member   wife, [name]   Pertinent History HTN, CAD, COPD, A-fib    Limitations Standing;Walking    How long can you sit comfortably? 15'    How long can you stand comfortably? 28'    How long can you walk comfortably? 65'    Patient Stated Goals wants to improve his balance    Currently in Pain? Yes    Pain Score 3     Pain Location Shoulder    Pain Orientation Right;Left    Pain Descriptors / Indicators Sore    Pain Type Acute pain    Pain Onset Today    Aggravating Factors  after cleaning out his kitchen drawers.              [facility] PT Assessment - 01/02/21 1622      Assessment    Medical Diagnosis Subdural hematoma    Referring Provider (PT) Dohmeier, Asencion Partridge, MD    Onset Date/Surgical Date 08/15/20      Precautions   Precautions Fall      Prior Function   Level of Independence Independent      Ambulation/Gait   Ambulation/Gait Yes    Ambulation/Gait Assistance 4: Min guard;5: Supervision    Ambulation Distance (Feet) 230 Feet    Assistive device Straight cane   with 4 prong tip   Gait Pattern Step-through pattern;Decreased step length - right;Decreased step length - left    Ambulation Surface Level;Indoor    Gait velocity 16.84 seconds = 1.92 ft/sec      Timed Up and Go Test   Normal TUG (seconds) 14.91   1st attempt, needing min A for balance when turning, 2nd attempt 14.53 seconds with supervision   TUG Comments using SPC with quad tip                          Balance Exercises - 01/02/21 1652      Balance Exercises: Standing   Standing Eyes Opened Wide (BOA);Head turns;Limitations    Standing Eyes Opened Time on blue air ex; x10 reps head turns    Standing Eyes Closed Wide (BOA);Foam/compliant surface;30 secs;Limitations    Standing Eyes Closed Limitations feet hip width distance, 2 x 30 seconds           Access Code: [email or web address] URL: [email or web address] Date: 01/02/2021 Prepared by: Janann August  Reviewed and updated pt's HEP - see [facility] for more details.   Exercises Standing Toe Raises at Chair - 2 x daily - 5 x weekly - 2 sets - 10 reps Standing Heel Raise with Chair Support - 2 x daily - 5 x weekly - 2 sets - 10 reps Sit to Stand with Armchair - 1 x daily - 5 x weekly - 2 sets - 5 reps Side Stepping with Counter Support - 2 x daily - 5 x weekly - 3 sets Standing March with Counter Support - 1 x daily - 5 x weekly - 1-2 sets - 5 reps    PT Education - 01/02/21 1701    Education Details updated HEP    Person(s) Educated Patient    Methods Explanation;Demonstration;Handout    Comprehension  Verbalized understanding;Returned demonstration            PT Short Term Goals - 11/28/20 1455      PT SHORT TERM GOAL #1   Title Pt will  initiate HEP in order to indicate improved functional mobility and decreased fall risk.  ALL STGS DUE 11/21/20    Baseline reviewed HEP on 11/28/20    Time 4    Period Weeks    Status Achieved    Target Date 11/21/20      PT SHORT TERM GOAL #2   Title Pt will perform 5x sit <> stand in 17 seconds or less with BUE support in order to demo improved functional strength.    Baseline 19.31 seconds, 10.97 seconds with BUE support on 11/28/20    Time 4    Period Weeks    Status Achieved      PT SHORT TERM GOAL #3   Title Pt will undergo BERG with STG and LTG written as appropriate.    Baseline 10/27/20 BERG 37/56    Time 4    Period Weeks    Status Achieved      PT SHORT TERM GOAL #4   Title Pt will improve gait speed with rollator to at least 2.5 ft/sec in order demo improved community mobility.    Baseline 2.18 ft/sec, 14.41 seconds = 2.27 ft/sec on 3/22    Time 4    Period Weeks    Status Not Met      PT SHORT TERM GOAL #5   Title Pt and pt's spouse will verbalize understanding of fall prevention in the home to decr fall risk.    Time 4    Period Weeks    Status New             PT Long Term Goals - 01/02/21 1619      PT LONG TERM GOAL #1   Title Pt will be independent with final HEP in order to indicate improved functional mobility and decreased fall risk.  ALL LTGS DUE 12/19/20    Baseline updated on 01/02/21 - will benefit from on going additions    Time 8    Period Weeks    Status On-going      PT LONG TERM GOAL #2   Title BERG goal to be written as appropriate to determine decr fall risk.  BERG goal is 45/56    Baseline 37; 41/56 on 12/19/20    Time 8    Period Weeks    Status Not Met      PT LONG TERM GOAL #3   Title Pt will perform 5x sit <> stand in 14.5 seconds or less with BUE vs. single UE support in order to demo  improved functional strength.    Baseline 19.31 seconds; 10.97 seconds with BUE support on 11/28/20    Time 8    Period Weeks    Status Achieved      PT LONG TERM GOAL #4   Title Pt will ambulate at least 150' over indoor surfaces with cane vs. LRAD with supervision in order to demo improved household mobility.    Baseline supervision/min guard with SPC with quad tip on 12/19/20    Time 8    Period Weeks    Status Partially Met      PT LONG TERM GOAL #5   Title Pt will perform TUG in 14.5 seconds or less with rollator vs. LRAD in order to demo decr fall risk.    Baseline 16.94 seconds with rollator; 14.5 seconds with SPC with 4 prong tip    Time 8    Period Weeks    Status Achieved  Revised/ongoing LTGs:      PT Long Term Goals - 01/02/21 1707      PT LONG TERM GOAL #1   Title Pt will be independent with final HEP in order to indicate improved functional mobility and decreased fall risk.  ALL LTGS DUE 01/30/21    Baseline updated on 01/02/21 - will benefit from on going additions    Time 4    Period Weeks    Status On-going    Target Date 01/30/21      PT LONG TERM GOAL #2   Title Pt to improve BERG score to at least a 44/56 in order to demo decr fall risk.    Baseline 37; 41/56 on 12/19/20    Time 4    Period Weeks    Status Revised      PT LONG TERM GOAL #3   Title Pt will improve gait speed to at least 2.1 ft/sec in order to demo improved gait efficiency.    Baseline 1.92 ft/sec with SPC with quad tip    Time 4    Period Weeks    Status Revised      PT LONG TERM GOAL #4   Title Pt will ambulate at least 230' over indoor surfaces with cane vs. LRAD with supervision in order to demo improved household mobility.    Baseline supervision/min guard with SPC with quad tip on 12/19/20    Time 4    Period Weeks    Status Revised      PT LONG TERM GOAL #5   Title Pt will perform TUG in 13.5 seconds or less with SPC with quad tip in order to demo decr fall risk.     Baseline 14.5 seconds with SPC with 4 prong tip    Time 4    Period Weeks    Status Revised               Plan - 01/02/21 1703    Clinical Impression Statement Today's skiled session focused on assessing remainder of LTGs. Pt met LTG in regards to TUG - performed in 14.5 seconds with SPC with quad tip. However, during 1st attempt pt needing min A for balance when performing a turn. Pt's gait speed indicates that pt is a limited community ambulator. Updated HEP today to include more standing exercises at countertop with BUE support and sit <> stands for functional strengththening. Will re-cert for an additional 2x week for 4 weeks to continue to work on strength, balance, gait in order to improve functional mobility and decr pt's risk of falls.    Personal Factors and Comorbidities Comorbidity 3+;Past/Current Experience    Comorbidities HTN, CAD, COPD, A-fib, hx of COVID march 2021    Examination-Activity Limitations Stand;Squat;Transfers;Reach Overhead;Locomotion Level    Examination-Participation Restrictions Community Activity;Driving    Stability/Clinical Decision Making Evolving/Moderate complexity    Rehab Potential Good    PT Frequency 2x / week    PT Duration 8 weeks    PT Treatment/Interventions ADLs/Self Care Home Management;DME Instruction;Gait training;Functional mobility training;Stair training;Neuromuscular re-education;Balance training;Therapeutic exercise;Therapeutic activities;Patient/family education;Vestibular;Energy conservation    PT Next Visit Plan how is new HEP? work on functional BLE strengthening, balance with narrow BOS and SLS, compliant surfaces.    Consulted and Agree with Plan of Care Patient           Patient will benefit from skilled therapeutic intervention in order to improve the following deficits and impairments:  Abnormal gait,Decreased activity tolerance,Decreased balance,Decreased endurance,Decreased coordination,Decreased  cognition,Decreased  knowledge of use of DME,Decreased range of motion,Difficulty walking,Decreased safety awareness,Decreased strength  Visit Diagnosis: Muscle weakness (generalized)  Other abnormalities of gait and mobility     Problem List Patient Active Problem List   Diagnosis Date Noted  . AAA (abdominal aortic aneurysm) without rupture ([place]) 11/14/2020  . Subdural hematoma ([place]) 08/15/2020  . Iron deficiency anemia due to chronic blood loss 01/06/2019  . Orthostatic hypotension 01/05/2019    Class: Question of  . Normocytic normochromic anemia 01/05/2019  . Near syncope 01/05/2019  . Chronic respiratory failure with hypoxia ([place]) 06/30/2018  . OSA (obstructive sleep apnea) 02/11/2018  . Physical deconditioning 08/11/2017  . Shortness of breath 10/24/2014  . COPD (chronic obstructive pulmonary disease) with emphysema ([place]) 09/01/2014  . Fatigue 10/14/2011  . Atrial fibrillation ([place]) 05/15/2011  . Premature ventricular contractions (PVCs) ([facility]) 02/25/2011  . 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  . PERSISTENT DISORDER INITIATING/MAINTAINING SLEEP 05/10/2010  . HYPERCHOLESTEROLEMIA 05/09/2010  . GOUT 05/09/2010  . OBESITY 05/09/2010  . Obstructive sleep apnea 05/09/2010  . Essential hypertension 05/09/2010  . VENTRICULAR TACHYCARDIA 05/09/2010    Arliss Journey, PT, DPT  01/02/2021, 5:06 PM  [facility] [address] [place], [place], [address] Phone: [phone]   Fax:  [phone]  Name: KINO DUNSWORTH MRN: [id number] Date of Birth: 1934/04/23

## 2021-01-02 NOTE — Therapy (Signed)
[facility] [address] [place], [place], [address] Phone: [phone]   Fax:  [phone]  Speech Language Pathology Treatment  Patient Details  Name: [name] MRN: [id number] Date of Birth: July 19, 1934 Referring Provider (SLP): Margie Billet, PA-C; Dr. [name] PCP (doc)   Encounter Date: 01/02/2021   End of Session - 01/02/21 1702    Visit Number 9    Number of Visits 17    Date for SLP Re-Evaluation 01/29/21    SLP Start Time 1533    SLP Stop Time  1615    SLP Time Calculation (min) 42 min    Activity Tolerance Patient tolerated treatment well           Past Medical History:  Diagnosis Date  . Afib ([place])   . Arthritis   . BPH (benign prostatic hyperplasia)   . COPD (chronic obstructive pulmonary disease) ([place])   . Coronary artery disease   . Coronary artery ectasia    Mild CAD with normal systolic function per cath in August of 2011  . Depression   . Diaphragmatic paralysis    felt to be partially responsible for dyspnea  . Dyspnea    due to paralyzed diaphragm and pulmonary issues  . Dysrhythmia   . GERD (gastroesophageal reflux disease)   . Gout   . HTN (hypertension)   . Hypercholesteremia   . Hypertension   . Iron deficiency anemia   . Microhematuria   . Obesity   . OSA (obstructive sleep apnea)    CPAP  . Paroxysmal atrial fibrillation (HCC)    occured in the setting of acute E Coli sepsis and ileius 7/12  . PVC (premature ventricular contraction)    s/p PVC ablation 05/29/2010    Past Surgical History:  Procedure Laterality Date  . APPENDECTOMY  1978  . BIOPSY  02/09/2019   Procedure: BIOPSY;  Surgeon: Clarene Essex, MD;  [facility];  Service: Endoscopy;;  . BIOPSY  03/02/2019   Procedure: BIOPSY;  Surgeon: Clarene Essex, MD;  [facility];  Service: Endoscopy;;  . CARDIAC CATHETERIZATION  04-30-2010   Left main coronary artery is normal.   .  CARDIOVASCULAR STRESS TEST  03-19-2010   0%  . CHOLECYSTECTOMY  1990  . COLONOSCOPY N/A 03/02/2019   Procedure: COLONOSCOPY;  Surgeon: Clarene Essex, MD;  [facility];  Service: Endoscopy;  Laterality: N/A;  . COLONOSCOPY W/ POLYPECTOMY    . ESOPHAGOGASTRODUODENOSCOPY (EGD) WITH PROPOFOL N/A 02/09/2019   Procedure: ESOPHAGOGASTRODUODENOSCOPY (EGD) WITH PROPOFOL;  Surgeon: Clarene Essex, MD;  [facility];  Service: Endoscopy;  Laterality: N/A;  . EYE SURGERY Right    catarct  . poly removed from nose as a child    . PVC ablation  05/29/2010  . RADIOLOGY WITH ANESTHESIA N/A 11/07/2020   Procedure: MRV HEAD WITH AND WITHOUT;  Surgeon: Radiologist, Medication, MD;  Location: [facility];  Service: Radiology;  Laterality: N/A;  . RADIOLOGY WITH ANESTHESIA N/A 11/07/2020   Procedure: CT LUMBAR SPINE WITHOUT;  Surgeon: Radiologist, Medication, MD;  Location: [facility];  Service: Radiology;  Laterality: N/A;  . US ECHOCARDIOGRAPHY  03-09-2010   EF 55-60%    There were no vitals filed for this visit.   Subjective Assessment - 01/02/21 1544    Subjective Pt tells SLP he is back to minmal completion of PT exercises.    Currently in Pain? Yes    Pain Score 3     Pain Location Shoulder  Pain Orientation Left;Right    Pain Descriptors / Indicators Sore    Pain Onset In the past 7 days    Pain Frequency Constant                 ADULT SLP TREATMENT - 01/02/21 1549      General Information   Behavior/Cognition Alert;Cooperative;Pleasant mood      Treatment Provided   Treatment provided Cognitive-Linquistic      Cognitive-Linquistic Treatment   Treatment focused on Cognition    Skilled Treatment Pt reports to SLP he is not completing his PT exercises as he has gotten out of the habit. He deleted his alarm for completing them so SLP assisted pt in setting two alarms in his phone for doing them. Originally pt wanted to set reminders but after SLP explanation between reminder and alarms,  pt thought alarm would be better suited for daily recall to do PT exercises. Pt req'd extra time, and mod A consistently. Explanation for pt re: alarms vs. reminders, and setting of two alarms took 20 minutes. Limited/no awareness of errors during this process. Pt req'd mod A with how to find when he returns for next session/s. Pt told SLP he needed more homework so SLP provided more homework for pt to complete 30 minutes of/night.      Assessment / Recommendations / Plan   Plan Continue with current plan of care      Progression Toward Goals   Progression toward goals Not progressing toward goals (comment)   severity           SLP Education - 01/02/21 1702    Education Details how to set alarm/s ( for PT exercises)    Person(s) Educated Patient    Methods Explanation;Demonstration;Verbal cues    Comprehension Need further instruction;Verbal cues required;Returned demonstration;Verbalized understanding            SLP Short Term Goals - 01/02/21 1704      SLP SHORT TERM GOAL #1   Title pt will perform functional reading and/or written tasks for 7 minutes in 2 sessions    Baseline 12-19-20    Status Achieved      SLP SHORT TERM GOAL #2   Title pt will process verbal or written directions with 80% accuracy with occasional min A x3 vistis    Baseline 12-05-20, 12-12-20    Status Achieved      SLP SHORT TERM GOAL #3   Title pt will verbally state 3 strategies to improve memory skills in 3 sessions    Baseline 12-12-20 (phone alarm), 12-19-20 (visual cue - blue folder)    Status Partially Met      SLP SHORT TERM GOAL #4   Title pt will demo understanding of his non-physical deficits folloiwng brain bleed with modified independence in 3 sessions    Baseline 12-12-20, 12-21-20    Status Partially Met            SLP Long Term Goals - 01/02/21 1704      SLP LONG TERM GOAL #1   Title Pt will use memory compensation system for daily tasks when appropriate, x1 in 4 sessions    Baseline  12-19-20 (PT HEP),    Time 4    Period Weeks   or 17 visits, for all LTGs   Status On-going      SLP LONG TERM GOAL #2   Title pt will demonstrate error awareness in linguistic tasks 90% of the time with nonverbal cues  in 3 sessions    Time 4    Period Weeks    Status On-going      SLP LONG TERM GOAL #3   Title pt and/or wife will report pt has completed tasks demonstrating ability to solve problems in the home environment x3 sessions    Time 4    Period Weeks    Status On-going      SLP LONG TERM GOAL #4   Title pt will show knowledge of compensating for cognitive deficits in linguistic tasks    Time 4    Period Weeks    Status On-going            Plan - 01/02/21 1703    Clinical Impression Statement Japhet "[name]" Southern presents today with cognitive communication deficits in areas of attention, problem solving, processing, awareness, and memory. SLP has not noted overt s/sx aphasia to date. SLP believes pt will benefit from skilled ST tageting these cognitive deficits and assist pt to regain his PLOF. Pt severity of deficit is becoming more of an issue in progression of cognitive goals.    Speech Therapy Frequency 2x / week    Duration 8 weeks   or 17 total sessions   Treatment/Interventions Language facilitation;Compensatory techniques;Internal/external aids;SLP instruction and feedback;Patient/family education;Environmental controls;Cognitive reorganization    Potential to Achieve Goals Good    Consulted and Agree with Plan of Care Patient;Family member/caregiver           Patient will benefit from skilled therapeutic intervention in order to improve the following deficits and impairments:   Cognitive communication deficit    Problem List Patient Active Problem List   Diagnosis Date Noted  . AAA (abdominal aortic aneurysm) without rupture ([place]) 11/14/2020  . Subdural hematoma ([place]) 08/15/2020  . Iron deficiency anemia due to chronic blood loss 01/06/2019  .  Orthostatic hypotension 01/05/2019    Class: Question of  . Normocytic normochromic anemia 01/05/2019  . Near syncope 01/05/2019  . Chronic respiratory failure with hypoxia ([place]) 06/30/2018  . OSA (obstructive sleep apnea) 02/11/2018  . Physical deconditioning 08/11/2017  . Shortness of breath 10/24/2014  . COPD (chronic obstructive pulmonary disease) with emphysema ([place]) 09/01/2014  . Fatigue 10/14/2011  . Atrial fibrillation ([place]) 05/15/2011  . Premature ventricular contractions (PVCs) ([facility]) 02/25/2011  . 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  . PERSISTENT DISORDER INITIATING/MAINTAINING SLEEP 05/10/2010  . HYPERCHOLESTEROLEMIA 05/09/2010  . GOUT 05/09/2010  . OBESITY 05/09/2010  . Obstructive sleep apnea 05/09/2010  . Essential hypertension 05/09/2010  . VENTRICULAR TACHYCARDIA 05/09/2010    [facility] ,MS, CCC-SLP  01/02/2021, 5:05 PM  [facility] [address] [place], [place], [address] Phone: [phone]   Fax:  [phone]   Name: GIANLUCA CHHIM MRN: [id number] Date of Birth: 07/22/1934

## 2021-01-02 NOTE — Patient Instructions (Signed)
Access Code: [email or web address] URL: [email or web address] Date: 01/02/2021 Prepared by: Janann August  Exercises Standing Toe Raises at Chair - 2 x daily - 5 x weekly - 2 sets - 10 reps Standing Heel Raise with Chair Support - 2 x daily - 5 x weekly - 2 sets - 10 reps Sit to Stand with Armchair - 1 x daily - 5 x weekly - 2 sets - 5 reps Side Stepping with Counter Support - 2 x daily - 5 x weekly - 3 sets Standing March with Counter Support - 1 x daily - 5 x weekly - 1-2 sets - 5 reps

## 2021-01-02 NOTE — Therapy (Signed)
[facility] [address] [place], [place], [address] Phone: [phone]   Fax:  [phone]  Occupational Therapy Treatment  Patient Details  Name: [name] MRN: [id number] Date of Birth: 09/02/1934 Referring Provider (OT): Dr. Dohmeier (referred by hospitalist with send to neurology)   Encounter Date: 01/02/2021   OT End of Session - 01/02/21 1512    Visit Number 13    Number of Visits 25    Date for OT Re-Evaluation 01/24/21    Authorization Type [employer] Medicare    Authorization Time Period 90 days, anticipate d/c after 8 weeks . Week 6 (12/19/20)    Authorization - Visit Number 13    Authorization - Number of Visits 20    OT Start Time 1450    OT Stop Time 1530    OT Time Calculation (min) 40 min           Past Medical History:  Diagnosis Date  . Afib (HCC)   . Arthritis   . BPH (benign prostatic hyperplasia)   . COPD (chronic obstructive pulmonary disease) (HCC)   . Coronary artery disease   . Coronary artery ectasia    Mild CAD with normal systolic function per cath in August of 2011  . Depression   . Diaphragmatic paralysis    felt to be partially responsible for dyspnea  . Dyspnea    due to paralyzed diaphragm and pulmonary issues  . Dysrhythmia   . GERD (gastroesophageal reflux disease)   . Gout   . HTN (hypertension)   . Hypercholesteremia   . Hypertension   . Iron deficiency anemia   . Microhematuria   . Obesity   . OSA (obstructive sleep apnea)    CPAP  . Paroxysmal atrial fibrillation (HCC)    occured in the setting of acute E Coli sepsis and ileius 7/12  . PVC (premature ventricular contraction)    s/p PVC ablation 05/29/2010    Past Surgical History:  Procedure Laterality Date  . APPENDECTOMY  1978  . BIOPSY  02/09/2019   Procedure: BIOPSY;  Surgeon: Magod, Marc, MD;  Location: [facility];  Service: Endoscopy;;  . BIOPSY  03/02/2019   Procedure: BIOPSY;  Surgeon: Magod, Marc,  MD;  Location: [facility];  Service: Endoscopy;;  . CARDIAC CATHETERIZATION  04-30-2010   Left main coronary artery is normal.   . CARDIOVASCULAR STRESS TEST  03-19-2010   0%  . CHOLECYSTECTOMY  1990  . COLONOSCOPY N/A 03/02/2019   Procedure: COLONOSCOPY;  Surgeon: Magod, Marc, MD;  [facility]: [facility];  Service: Endoscopy;  Laterality: N/A;  . COLONOSCOPY W/ POLYPECTOMY    . ESOPHAGOGASTRODUODENOSCOPY (EGD) WITH PROPOFOL N/A 02/09/2019   Procedure: ESOPHAGOGASTRODUODENOSCOPY (EGD) WITH PROPOFOL;  Surgeon: Magod, Marc, MD;  [facility]: [facility];  Service: Endoscopy;  Laterality: N/A;  . EYE SURGERY Right    catarct  . poly removed from nose as a child    . PVC ablation  05/29/2010  . RADIOLOGY WITH ANESTHESIA N/A 11/07/2020   Procedure: MRV HEAD WITH AND WITHOUT;  Surgeon: Radiologist, Medication, MD;  Location: [facility];  Service: Radiology;  Laterality: N/A;  . RADIOLOGY WITH ANESTHESIA N/A 11/07/2020   Procedure: CT LUMBAR SPINE WITHOUT;  Surgeon: Radiologist, Medication, MD;  Location: [facility];  Service: Radiology;  Laterality: N/A;  . US ECHOCARDIOGRAPHY  03-09-2010   EF 55-60%    There were no vitals filed for this visit.              Treatment: constant therapy alphabitizing alternating words 82% avg time 88.79 secs for alternating attention Standing to copy small peg design with LUE with supervision/ min guard assist, min difficulty/ v.c Pt reports he was instructed by neurologist not to bend due to back issues.             OT Education - 01/02/21 1453    Education Details reviewed cane exercises for shoulder flexion, chest press, and pt added overhead press, min v.c for positioning. Therapist had pt stand in corner for shoulder flexion, initally, then had pt transition to seated due to decreased balance. Therapist recommends pt performs in seated if his wife is not assisting him at home.   Person(s) Educated Patient    Methods Explanation;Demonstration;Verbal  cues;Handout    Comprehension Verbalized understanding;Returned demonstration;Verbal cues required            OT Short Term Goals - 12/14/20 1620      OT SHORT TERM GOAL #1   Title Pt/ caregeiver will be I with HEP. 11/29/20    Time 4    Period Weeks    Status Achieved   met, for coordination HEP   Target Date 11/29/20      OT SHORT TERM GOAL #2   Title Pt / wife will verbalize understanding of strategies to increase safety and I with ADLS.    Time 4    Period Weeks    Status Achieved      OT SHORT TERM GOAL #3   Title Pt will perfrom transitional movments for ADLS with close supervision and no LOB.    Baseline histroy of falls    Time 4    Period Weeks    Status Achieved      OT SHORT TERM GOAL #4   Title Pt will demonstrate improved fine motor coordination in his LUE as eveidenced by decreasing 9-hole peg test score by 4 secs. UPDATE: complete in 35 seconds or less with LUE    Baseline 45.72 LUE at eval    Time 4    Period Weeks    Status Revised   39.46s with LUE 12/14/2020     OT SHORT TERM GOAL #5   Title Pt will sequence a simple functional or ADL task  with 90% or better accuracy .    Time 4    Period Weeks    Status Achieved   93% on constant therapy     OT SHORT TERM GOAL #6   Title Pt will demonstrate improved fine motor coordination for ADLs as evidenced by decreasing 3 button/ unbutton time to 90 secs or less.    Time 4    Period Weeks    Status Achieved   49.25            OT Long Term Goals - 12/14/20 1621      OT LONG TERM GOAL #1   Title Pt/ wife will be I with updated HEP for proximal strength.    Time 12    Period Weeks    Status On-going      OT LONG TERM GOAL #2   Title Pt will demonstrate ability to perform simple beverage / snack prep modifeied indpendent demonstraing good saftey awareness.    Time 12    Period Weeks    Status Achieved   pt reports doing this at home     OT LONG TERM GOAL #3   Title Pt will perforrm bathing and   dressing mod I   in a reasonable amout of time    Baseline increased time required    Time 12    Period Weeks    Status Achieved   per pt report     OT LONG TERM GOAL #4   Title Pt will demonstrate adequate bilateral UE strength to retrieve a lightweight object (3 lbs) from eye level  shelf without dropping with right and left UE's individually.    Time 12    Period Weeks    Status Achieved      OT LONG TERM GOAL #5   Title Pt will increase standing functional reach test score to 10 inches or greater with RUE to minimize fall risk during ADLs.    Time 12    Period Weeks    Status On-going   6"     OT LONG TERM GOAL #6   Title Pt will perform physical and cognitive task with 90% accuracy    Time 12    Period Weeks    Status New                 Plan - 01/02/21 1512    Clinical Impression Statement Pt is progressing towards goals. He continues to demonstrate defticits with balance and alternating attention.    OT Occupational Profile and History Detailed Assessment- Review of Records and additional review of physical, cognitive, psychosocial history related to current functional performance    Occupational performance deficits (Please refer to evaluation for details): ADL's;IADL's;Leisure;Social Participation    Body Structure / Function / Physical Skills ADL;UE functional use;Balance;FMC;GMC;Coordination;Decreased knowledge of use of DME;IADL;Dexterity;Strength;Mobility    Cognitive Skills Memory;Safety Awareness;Problem Solve;Sequencing;Thought;Understand;Attention    OT Frequency 2x / week    OT Duration 12 weeks   12 weeks total. renewal completed at week 5/visit 10 to address goals.   OT Treatment/Interventions Self-care/ADL training;Energy conservation;Patient/family education;DME and/or AE instruction;Aquatic Therapy;Paraffin;Passive range of motion;Balance training;Fluidtherapy;Cryotherapy;Functional Mobility Training;Therapeutic activities;Manual Therapy;Therapeutic  exercise;Moist Heat;Neuromuscular education;Cognitive remediation/compensation    Plan standing activities for dynamic balance/functional reach, alternating attention/dual task, review cane exercises stan    Consulted and Agree with Plan of Care Patient;Family member/caregiver           Patient will benefit from skilled therapeutic intervention in order to improve the following deficits and impairments:   Body Structure / Function / Physical Skills: ADL,UE functional use,Balance,FMC,GMC,Coordination,Decreased knowledge of use of DME,IADL,Dexterity,Strength,Mobility Cognitive Skills: Memory,Safety Awareness,Problem Solve,Sequencing,Thought,Understand,Attention     Visit Diagnosis: Other lack of coordination  Attention and concentration deficit  Frontal lobe and executive function deficit  Muscle weakness (generalized)  Unsteadiness on feet    Problem List Patient Active Problem List   Diagnosis Date Noted  . AAA (abdominal aortic aneurysm) without rupture (HCC) 11/14/2020  . Subdural hematoma (HCC) 08/15/2020  . Iron deficiency anemia due to chronic blood loss 01/06/2019  . Orthostatic hypotension 01/05/2019    Class: Question of  . Normocytic normochromic anemia 01/05/2019  . Near syncope 01/05/2019  . Chronic respiratory failure with hypoxia (HCC) 06/30/2018  . OSA (obstructive sleep apnea) 02/11/2018  . Physical deconditioning 08/11/2017  . Shortness of breath 10/24/2014  . COPD (chronic obstructive pulmonary disease) with emphysema (HCC) 09/01/2014  . Fatigue 10/14/2011  . Atrial fibrillation (HCC) 05/15/2011  . Premature ventricular contractions (PVCs) (VPCs) 02/25/2011  . 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  . PERSISTENT DISORDER INITIATING/MAINTAINING SLEEP 05/10/2010  . HYPERCHOLESTEROLEMIA 05/09/2010  . GOUT 05/09/2010  . OBESITY 05/09/2010  . Obstructive sleep apnea 05/09/2010  . Essential hypertension 05/09/2010  .   VENTRICULAR TACHYCARDIA 05/09/2010     , 01/02/2021, 3:19 PM  [facility] [address] [place], [place], [address] Phone: [phone]   Fax:  [phone]  Name: Gilbert E Bouwens MRN: [id number] Date of Birth: 07/01/1934 

## 2021-01-02 NOTE — Patient Instructions (Signed)
  Please complete the assigned speech therapy homework prior to your next session and return it to the speech therapist at your next visit.  

## 2021-01-04 ENCOUNTER — Ambulatory Visit: Payer: Medicare Other

## 2021-01-04 ENCOUNTER — Ambulatory Visit: Payer: Medicare Other | Admitting: Occupational Therapy

## 2021-01-04 ENCOUNTER — Other Ambulatory Visit: Payer: Self-pay

## 2021-01-04 DIAGNOSIS — R278 Other lack of coordination: Secondary | ICD-10-CM

## 2021-01-04 DIAGNOSIS — M6281 Muscle weakness (generalized): Secondary | ICD-10-CM

## 2021-01-04 DIAGNOSIS — R41844 Frontal lobe and executive function deficit: Secondary | ICD-10-CM

## 2021-01-04 DIAGNOSIS — R2681 Unsteadiness on feet: Secondary | ICD-10-CM | POA: Diagnosis not present

## 2021-01-04 DIAGNOSIS — R41841 Cognitive communication deficit: Secondary | ICD-10-CM

## 2021-01-04 DIAGNOSIS — R2689 Other abnormalities of gait and mobility: Secondary | ICD-10-CM

## 2021-01-04 DIAGNOSIS — R41840 Attention and concentration deficit: Secondary | ICD-10-CM

## 2021-01-04 NOTE — Therapy (Signed)
[facility] [address] [place], [place], [address] Phone: [phone]   Fax:  [phone]  Occupational Therapy Treatment  Patient Details  Name: [name] MRN: [id number] Date of Birth: Oct 11, 1933 Referring Provider (OT): Dr. Brett Fairy (referred by hospitalist with send to neurology)   Encounter Date: 01/04/2021   OT End of Session - 01/04/21 1453    Visit Number 14    Number of Visits 25    Date for OT Re-Evaluation 01/24/21    Authorization Type UHC Medicare    Authorization Time Period 90 days, anticipate d/c after 8 weeks . Week 6 (12/19/20)    Authorization - Visit Number 14    Authorization - Number of Visits 20    OT Start Time 4801    OT Stop Time 1530    OT Time Calculation (min) 38 min           Past Medical History:  Diagnosis Date  . Afib ([place])   . Arthritis   . BPH (benign prostatic hyperplasia)   . COPD (chronic obstructive pulmonary disease) ([place])   . Coronary artery disease   . Coronary artery ectasia    Mild CAD with normal systolic function per cath in August of 2011  . Depression   . Diaphragmatic paralysis    felt to be partially responsible for dyspnea  . Dyspnea    due to paralyzed diaphragm and pulmonary issues  . Dysrhythmia   . GERD (gastroesophageal reflux disease)   . Gout   . HTN (hypertension)   . Hypercholesteremia   . Hypertension   . Iron deficiency anemia   . Microhematuria   . Obesity   . OSA (obstructive sleep apnea)    CPAP  . Paroxysmal atrial fibrillation (HCC)    occured in the setting of acute E Coli sepsis and ileius 7/12  . PVC (premature ventricular contraction)    s/p PVC ablation 05/29/2010    Past Surgical History:  Procedure Laterality Date  . APPENDECTOMY  1978  . BIOPSY  02/09/2019   Procedure: BIOPSY;  Surgeon: Clarene Essex, MD;  [facility];  Service: Endoscopy;;  . BIOPSY  03/02/2019   Procedure: BIOPSY;  Surgeon: Clarene Essex,  MD;  Location: [facility];  Service: Endoscopy;;  . CARDIAC CATHETERIZATION  04-30-2010   Left main coronary artery is normal.   . CARDIOVASCULAR STRESS TEST  03-19-2010   0%  . CHOLECYSTECTOMY  1990  . COLONOSCOPY N/A 03/02/2019   Procedure: COLONOSCOPY;  Surgeon: Clarene Essex, MD;  [facility];  Service: Endoscopy;  Laterality: N/A;  . COLONOSCOPY W/ POLYPECTOMY    . ESOPHAGOGASTRODUODENOSCOPY (EGD) WITH PROPOFOL N/A 02/09/2019   Procedure: ESOPHAGOGASTRODUODENOSCOPY (EGD) WITH PROPOFOL;  Surgeon: Clarene Essex, MD;  [facility];  Service: Endoscopy;  Laterality: N/A;  . EYE SURGERY Right    catarct  . poly removed from nose as a child    . PVC ablation  05/29/2010  . RADIOLOGY WITH ANESTHESIA N/A 11/07/2020   Procedure: MRV HEAD WITH AND WITHOUT;  Surgeon: Radiologist, Medication, MD;  Location: [facility];  Service: Radiology;  Laterality: N/A;  . RADIOLOGY WITH ANESTHESIA N/A 11/07/2020   Procedure: CT LUMBAR SPINE WITHOUT;  Surgeon: Radiologist, Medication, MD;  Location: [facility];  Service: Radiology;  Laterality: N/A;  . US ECHOCARDIOGRAPHY  03-09-2010   EF 55-60%    There were no vitals filed for this visit.   Subjective Assessment - 01/04/21 1453    Subjective  Denies pain    Pertinent History Pt had a fall while shopping in [employer] on 08/15/20. Pt sustained a parafalcine and tentorial SDH. Was discharged from the hospital on 08/17/20.PMH:HTN, CAD, COPD, A-fib    Patient Stated Goals improve balance and strength    Currently in Pain? No/denies                   Treatment: arm bike x 6 mins level 3 for conditioning, Alphabatizing alternating words for increased alternating attention 81 %, 91.84 secs Standing at table, functional reach with LUE for graded clothespins 1-8#, no LOB.             OT Education - 01/04/21 1455    Education Details Reveiwed all cane exercises from HEP in seated, pt was instructed to perform in seated instead of standing due to  fall risk, pt verbalized understanding 10-15 reps    Person(s) Educated Patient    Methods Explanation;Demonstration;Verbal cues;Handout    Comprehension Verbalized understanding;Returned demonstration;Verbal cues required            OT Short Term Goals - 12/14/20 1620      OT SHORT TERM GOAL #1   Title Pt/ caregeiver will be I with HEP. 11/29/20    Time 4    Period Weeks    Status Achieved   met, for coordination HEP   Target Date 11/29/20      OT SHORT TERM GOAL #2   Title Pt / wife will verbalize understanding of strategies to increase safety and I with ADLS.    Time 4    Period Weeks    Status Achieved      OT SHORT TERM GOAL #3   Title Pt will perfrom transitional movments for ADLS with close supervision and no LOB.    Baseline histroy of falls    Time 4    Period Weeks    Status Achieved      OT SHORT TERM GOAL #4   Title Pt will demonstrate improved fine motor coordination in his LUE as eveidenced by decreasing 9-hole peg test score by 4 secs. UPDATE: complete in 35 seconds or less with LUE    Baseline 45.72 LUE at eval    Time 4    Period Weeks    Status Revised   39.46s with LUE 12/14/2020     OT SHORT TERM GOAL #5   Title Pt will sequence a simple functional or ADL task  with 90% or better accuracy .    Time 4    Period Weeks    Status Achieved   93% on constant therapy     OT SHORT TERM GOAL #6   Title Pt will demonstrate improved fine motor coordination for ADLs as evidenced by decreasing 3 button/ unbutton time to 90 secs or less.    Time 4    Period Weeks    Status Achieved   49.25            OT Long Term Goals - 01/04/21 1457      OT LONG TERM GOAL #1   Title Pt/ wife will be I with updated HEP for proximal strength.    Time 12    Period Weeks    Status On-going      OT LONG TERM GOAL #2   Title Pt will demonstrate ability to perform simple beverage / snack prep modifeied indpendent demonstraing good saftey awareness.    Time 12    Period  Weeks    Status Achieved   pt reports doing this at home     OT LONG TERM GOAL #3   Title Pt will perforrm bathing and  dressing mod I in a reasonable amout of time    Baseline increased time required    Time 12    Period Weeks    Status Achieved   per pt report     OT LONG TERM GOAL #4   Title Pt will demonstrate adequate bilateral UE strength to retrieve a lightweight object (3 lbs) from eye level  shelf without dropping with right and left UE's individually.    Time 12    Period Weeks    Status Achieved      OT LONG TERM GOAL #5   Title Pt will increase standing functional reach test score to 10 inches or greater with RUE to minimize fall risk during ADLs.    Time 12    Period Weeks    Status On-going   6"     OT LONG TERM GOAL #6   Title Pt will perform physical and cognitive task with 90% accuracy    Time 12    Period Weeks    Status New                 Plan - 01/04/21 1511    Clinical Impression Statement Pt is progressing towards goals.Pt demonstrates understanding of cane exercises, performing in seated due to flutuating balance.    OT Occupational Profile and History Detailed Assessment- Review of Records and additional review of physical, cognitive, psychosocial history related to current functional performance    Occupational performance deficits (Please refer to evaluation for details): ADL's;IADL's;Leisure;Social Participation    Body Structure / Function / Physical Skills ADL;UE functional use;Balance;FMC;GMC;Coordination;Decreased knowledge of use of DME;IADL;Dexterity;Strength;Mobility    Cognitive Skills Memory;Safety Awareness;Problem Solve;Sequencing;Thought;Understand;Attention    OT Frequency 2x / week    OT Duration 12 weeks   12 weeks total. renewal completed at week 5/visit 10 to address goals.   OT Treatment/Interventions Self-care/ADL training;Energy conservation;Patient/family education;DME and/or AE instruction;Aquatic Therapy;Paraffin;Passive  range of motion;Balance training;Fluidtherapy;Cryotherapy;Therapist, nutritional;Therapeutic activities;Manual Therapy;Therapeutic exercise;Moist Heat;Neuromuscular education;Cognitive remediation/compensation    Plan dynamic balance and functional reach, progress HEP, consider seated low range theraband exercises for proximal strength/ vs. light hand weights    Consulted and Agree with Plan of Care Patient;Family member/caregiver           Patient will benefit from skilled therapeutic intervention in order to improve the following deficits and impairments:   Body Structure / Function / Physical Skills: ADL,UE functional use,Balance,FMC,GMC,Coordination,Decreased knowledge of use of DME,IADL,Dexterity,Strength,Mobility Cognitive Skills: Memory,Safety Awareness,Problem Solve,Sequencing,Thought,Understand,Attention     Visit Diagnosis: Other lack of coordination  Attention and concentration deficit  Frontal lobe and executive function deficit  Muscle weakness (generalized)  Unsteadiness on feet    Problem List Patient Active Problem List   Diagnosis Date Noted  . AAA (abdominal aortic aneurysm) without rupture ([place]) 11/14/2020  . Subdural hematoma ([place]) 08/15/2020  . Iron deficiency anemia due to chronic blood loss 01/06/2019  . Orthostatic hypotension 01/05/2019    Class: Question of  . Normocytic normochromic anemia 01/05/2019  . Near syncope 01/05/2019  . Chronic respiratory failure with hypoxia ([place]) 06/30/2018  . OSA (obstructive sleep apnea) 02/11/2018  . Physical deconditioning 08/11/2017  . Shortness of breath 10/24/2014  . COPD (chronic obstructive pulmonary disease) with emphysema ([place]) 09/01/2014  . Fatigue 10/14/2011  . Atrial fibrillation ([place]) 05/15/2011  .  Premature ventricular contractions (PVCs) (VPCs) 02/25/2011  . 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  . PERSISTENT DISORDER INITIATING/MAINTAINING SLEEP 05/10/2010  . HYPERCHOLESTEROLEMIA 05/09/2010  .  GOUT 05/09/2010  . OBESITY 05/09/2010  . Obstructive sleep apnea 05/09/2010  . Essential hypertension 05/09/2010  . VENTRICULAR TACHYCARDIA 05/09/2010    [name] 01/04/2021, 3:16 PM  [facility] [address] [place], [place], [address] Phone: [phone]   Fax:  [phone]  Name: [name] MRN: [id number] Date of Birth: [birth year]

## 2021-01-04 NOTE — Patient Instructions (Signed)
  Check with your list of passwords to try to find your Apple ID password - then you can download the [facility] weather app.  Do your homework (about the buffet) and bring back with you. - double check your answers!

## 2021-01-04 NOTE — Therapy (Signed)
[facility] [address] [place], [place], [address] Phone: [phone]   Fax:  [phone]  Speech Language Pathology Treatment/ Progress Note  Patient Details  Name: [name] MRN: [id number] Date of Birth: 07-02-1934 Referring Provider (SLP): Margie Billet, PA-C; Dr. [name] PCP (doc)   Encounter Date: 01/04/2021   End of Session - 01/04/21 1632    Number of Visits 17    Date for SLP Re-Evaluation 01/29/21    SLP Start Time 1405    SLP Stop Time  1445    SLP Time Calculation (min) 40 min    Activity Tolerance Patient tolerated treatment well           Past Medical History:  Diagnosis Date  . Afib ([place])   . Arthritis   . BPH (benign prostatic hyperplasia)   . COPD (chronic obstructive pulmonary disease) ([place])   . Coronary artery disease   . Coronary artery ectasia    Mild CAD with normal systolic function per cath in August of 2011  . Depression   . Diaphragmatic paralysis    felt to be partially responsible for dyspnea  . Dyspnea    due to paralyzed diaphragm and pulmonary issues  . Dysrhythmia   . GERD (gastroesophageal reflux disease)   . Gout   . HTN (hypertension)   . Hypercholesteremia   . Hypertension   . Iron deficiency anemia   . Microhematuria   . Obesity   . OSA (obstructive sleep apnea)    CPAP  . Paroxysmal atrial fibrillation (HCC)    occured in the setting of acute E Coli sepsis and ileius 7/12  . PVC (premature ventricular contraction)    s/p PVC ablation 05/29/2010    Past Surgical History:  Procedure Laterality Date  . APPENDECTOMY  1978  . BIOPSY  02/09/2019   Procedure: BIOPSY;  Surgeon: Clarene Essex, MD;  Location: [facility];  Service: Endoscopy;;  . BIOPSY  03/02/2019   Procedure: BIOPSY;  Surgeon: Clarene Essex, MD;  Location: [facility];  Service: Endoscopy;;  . CARDIAC CATHETERIZATION  04-30-2010   Left main coronary artery is normal.   . CARDIOVASCULAR  STRESS TEST  03-19-2010   0%  . CHOLECYSTECTOMY  1990  . COLONOSCOPY N/A 03/02/2019   Procedure: COLONOSCOPY;  Surgeon: Clarene Essex, MD;  [facility];  Service: Endoscopy;  Laterality: N/A;  . COLONOSCOPY W/ POLYPECTOMY    . ESOPHAGOGASTRODUODENOSCOPY (EGD) WITH PROPOFOL N/A 02/09/2019   Procedure: ESOPHAGOGASTRODUODENOSCOPY (EGD) WITH PROPOFOL;  Surgeon: Clarene Essex, MD;  [facility];  Service: Endoscopy;  Laterality: N/A;  . EYE SURGERY Right    catarct  . poly removed from nose as a child    . PVC ablation  05/29/2010  . RADIOLOGY WITH ANESTHESIA N/A 11/07/2020   Procedure: MRV HEAD WITH AND WITHOUT;  Surgeon: Radiologist, Medication, MD;  Location: [facility];  Service: Radiology;  Laterality: N/A;  . RADIOLOGY WITH ANESTHESIA N/A 11/07/2020   Procedure: CT LUMBAR SPINE WITHOUT;  Surgeon: Radiologist, Medication, MD;  Location: [facility];  Service: Radiology;  Laterality: N/A;  . US ECHOCARDIOGRAPHY  03-09-2010   EF 55-60%    There were no vitals filed for this visit.   Speech Therapy Progress Note  Dates of Reporting Period: 10-31-20 to present  Subjective Statement: Pt seen for 10 ST visits targeting basic attention and awareness.  Objective: See below  Goal Update: see below  Plan: see below  Reason Skilled Services are Required:  cont services - see "impression statement" below.        ADULT SLP TREATMENT - 01/04/21 1622      General Information   Behavior/Cognition Alert;Cooperative;Pleasant mood      Treatment Provided   Treatment provided Cognitive-Linquistic      Cognitive-Linquistic Treatment   Treatment focused on Cognition    Skilled Treatment Pt asked SLP how to make text on his phone larger - it reverted back to smaller print and pt did not know why it did that. SLP provided pt with mod A usually to magnify text. He asked SLP how to get a weather app on his phone and so SLP guided pt through how to do that. He req'd mod A to correctly search for  the app - did not look for magnifying glass and randomly scrolled for 15 seconds before asking SLP how to search. Pt did  not know his apple password for his account so the process stopped there. Pt said he would ask his daughter later but did not make himself a reminder to do so. SLP used this opportunity to write pt a note to ask his daughter how to check for his apple ID password. Pt stated he did not need to complete his HEP for PT BID, but SLP checked and pt's HEP is for BID. SLP let pt's PT know this.      Assessment / Recommendations / Plan   Plan Continue with current plan of care      Progression Toward Goals   Progression toward goals --   pt cont hindered by reduced intellectual/emergent awareness and reduced selective attention           SLP Education - 01/04/21 1630    Education Details how to increase font size, ask daughter for apple ID password    Person(s) Educated Patient    Methods Explanation;Handout    Comprehension Verbalized understanding            SLP Short Term Goals - 01/02/21 1704      SLP SHORT TERM GOAL #1   Title pt will perform functional reading and/or written tasks for 7 minutes in 2 sessions    Baseline 12-19-20    Status Achieved      SLP SHORT TERM GOAL #2   Title pt will process verbal or written directions with 80% accuracy with occasional min A x3 vistis    Baseline 12-05-20, 12-12-20    Status Achieved      SLP SHORT TERM GOAL #3   Title pt will verbally state 3 strategies to improve memory skills in 3 sessions    Baseline 12-12-20 (phone alarm), 12-19-20 (visual cue - blue folder)    Status Partially Met      SLP SHORT TERM GOAL #4   Title pt will demo understanding of his non-physical deficits folloiwng brain bleed with modified independence in 3 sessions    Baseline 12-12-20, 12-21-20    Status Partially Met            SLP Long Term Goals - 01/04/21 1634      SLP LONG TERM GOAL #1   Title Pt will use memory compensation system for  daily tasks when appropriate, x1 in 4 sessions    Baseline 12-19-20 (PT HEP),    Time 4    Period Weeks   or 17 visits, for all LTGs   Status On-going      SLP LONG TERM GOAL #2   Title pt  will demonstrate error awareness in linguistic tasks 90% of the time with nonverbal cues in 3 sessions    Time 4    Period Weeks    Status On-going      SLP LONG TERM GOAL #3   Title pt and/or wife will report pt has completed tasks demonstrating ability to solve problems in the home environment x3 sessions    Time 4    Period Weeks    Status On-going      SLP LONG TERM GOAL #4   Title pt will show knowledge of compensating for cognitive deficits in linguistic tasks    Time 4    Period Weeks    Status On-going            Plan - 01/04/21 1632    Clinical Impression Statement [name] presents today with cognitive communication deficits in areas of attention, problem solving, processing, awareness, and memory. Pt has minimal word finding issues but only when more complex language or topic is involved. Pt cont to struggle with mod complex tasks or simple novel tasks. SLP. Pt severity of deficit is becoming more of an issue in progression of cognitive goals, however pt to be seen for approx 6-7 more sessions and then consideration will be made to d/c at highest current rehab level, or to renew for more visits if SLP believes this will benefit pt.    Speech Therapy Frequency 2x / week    Duration 8 weeks   or 17 total sessions   Treatment/Interventions Language facilitation;Compensatory techniques;Internal/external aids;SLP instruction and feedback;Patient/family education;Environmental controls;Cognitive reorganization    Potential to Achieve Goals Good    Consulted and Agree with Plan of Care Patient;Family member/caregiver           Patient will benefit from skilled therapeutic intervention in order to improve the following deficits and impairments:   Cognitive communication  deficit    Problem List Patient Active Problem List   Diagnosis Date Noted  . AAA (abdominal aortic aneurysm) without rupture ([place]) 11/14/2020  . Subdural hematoma ([place]) 08/15/2020  . Iron deficiency anemia due to chronic blood loss 01/06/2019  . Orthostatic hypotension 01/05/2019    Class: Question of  . Normocytic normochromic anemia 01/05/2019  . Near syncope 01/05/2019  . Chronic respiratory failure with hypoxia ([place]) 06/30/2018  . OSA (obstructive sleep apnea) 02/11/2018  . Physical deconditioning 08/11/2017  . Shortness of breath 10/24/2014  . COPD (chronic obstructive pulmonary disease) with emphysema ([place]) 09/01/2014  . Fatigue 10/14/2011  . Atrial fibrillation ([place]) 05/15/2011  . Premature ventricular contractions (PVCs) ([facility]) 02/25/2011  . 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  . PERSISTENT DISORDER INITIATING/MAINTAINING SLEEP 05/10/2010  . HYPERCHOLESTEROLEMIA 05/09/2010  . GOUT 05/09/2010  . OBESITY 05/09/2010  . Obstructive sleep apnea 05/09/2010  . Essential hypertension 05/09/2010  . VENTRICULAR TACHYCARDIA 05/09/2010    [facility] ,MS, CCC-SLP  01/04/2021, 4:35 PM  [facility] [address] [place], [place], [address] Phone: [phone]   Fax:  [phone]   Name: [name] MRN: [id number] Date of Birth: [birth year]

## 2021-01-05 NOTE — Therapy (Signed)
[facility] [address] [place], [place], [address] Phone: [phone]   Fax:  [phone]  Physical Therapy Treatment  Patient Details  Name: [name] MRN: [id number] Date of Birth: Jan 08, 1934 Referring Provider (PT): Dohmeier, Asencion Partridge, MD   Encounter Date: 01/04/2021   PT End of Session - 01/04/21 1626    Visit Number 17    Number of Visits 23    Date for PT Re-Evaluation 03/03/21    Authorization Type [employer] Medicare    Progress Note Due on Visit 20    PT Start Time 1530    PT Stop Time 1615    PT Time Calculation (min) 45 min    Equipment Utilized During Treatment Gait belt    Activity Tolerance Patient tolerated treatment well    Behavior During Therapy WFL for tasks assessed/performed           Past Medical History:  Diagnosis Date  . Afib ([place])   . Arthritis   . BPH (benign prostatic hyperplasia)   . COPD (chronic obstructive pulmonary disease) ([place])   . Coronary artery disease   . Coronary artery ectasia    Mild CAD with normal systolic function per cath in August of 2011  . Depression   . Diaphragmatic paralysis    felt to be partially responsible for dyspnea  . Dyspnea    due to paralyzed diaphragm and pulmonary issues  . Dysrhythmia   . GERD (gastroesophageal reflux disease)   . Gout   . HTN (hypertension)   . Hypercholesteremia   . Hypertension   . Iron deficiency anemia   . Microhematuria   . Obesity   . OSA (obstructive sleep apnea)    CPAP  . Paroxysmal atrial fibrillation (HCC)    occured in the setting of acute E Coli sepsis and ileius 7/12  . PVC (premature ventricular contraction)    s/p PVC ablation 05/29/2010    Past Surgical History:  Procedure Laterality Date  . APPENDECTOMY  1978  . BIOPSY  02/09/2019   Procedure: BIOPSY;  Surgeon: Clarene Essex, MD;  [facility];  Service: Endoscopy;;  . BIOPSY  03/02/2019   Procedure: BIOPSY;  Surgeon: Clarene Essex, MD;   [facility];  Service: Endoscopy;;  . CARDIAC CATHETERIZATION  04-30-2010   Left main coronary artery is normal.   . CARDIOVASCULAR STRESS TEST  03-19-2010   0%  . CHOLECYSTECTOMY  1990  . COLONOSCOPY N/A 03/02/2019   Procedure: COLONOSCOPY;  Surgeon: Clarene Essex, MD;  [facility];  Service: Endoscopy;  Laterality: N/A;  . COLONOSCOPY W/ POLYPECTOMY    . ESOPHAGOGASTRODUODENOSCOPY (EGD) WITH PROPOFOL N/A 02/09/2019   Procedure: ESOPHAGOGASTRODUODENOSCOPY (EGD) WITH PROPOFOL;  Surgeon: Clarene Essex, MD;  [facility];  Service: Endoscopy;  Laterality: N/A;  . EYE SURGERY Right    catarct  . poly removed from nose as a child    . PVC ablation  05/29/2010  . RADIOLOGY WITH ANESTHESIA N/A 11/07/2020   Procedure: MRV HEAD WITH AND WITHOUT;  Surgeon: Radiologist, Medication, MD;  Location: [facility];  Service: Radiology;  Laterality: N/A;  . RADIOLOGY WITH ANESTHESIA N/A 11/07/2020   Procedure: CT LUMBAR SPINE WITHOUT;  Surgeon: Radiologist, Medication, MD;  Location: [facility];  Service: Radiology;  Laterality: N/A;  . US ECHOCARDIOGRAPHY  03-09-2010   EF 55-60%    There were no vitals filed for this visit.   Subjective Assessment - 01/04/21 1542    Subjective No new c/o,  reviewed compliance with HEP, no falls or LOB to note, has been using cane upstairs and walker downstairs w/o incident    Patient is accompained by: Family member   wife, [name]   Pertinent History HTN, CAD, COPD, A-fib    Limitations Standing;Walking    How long can you sit comfortably? 30'    How long can you stand comfortably? 69'    How long can you walk comfortably? 9'    Patient Stated Goals wants to improve his balance    Pain Onset Today                             [facility] Adult PT Treatment/Exercise - 01/04/21 0001      Knee/Hip Exercises: Aerobic   Other Aerobic Scifit L1 8'               Balance Exercises - 01/04/21 0001      Balance Exercises: Standing   Standing  Eyes Opened Wide (BOA);Head turns;30 secs    Standing Eyes Opened Limitations 30s static stand followed by head turns/nods 5x    SLS with Vectors Foam/compliant surface;Upper extremity assist 1;Limitations    SLS with Vectors Limitations standing on Airex and double tapping 2 cones    Step Ups Forward;UE support 1;Limitations    Step Ups Limitations performed as runners step from Airex 10x B    Retro Gait Foam/compliant surface;Upper extremity support;5 reps    Retro Gait Limitations performed 5 trips in // bars with decreasing UE support    Sidestepping Foam/compliant support;5 reps;Limitations    Sidestepping Limitations 5 trips in // bars with decreasing UE support    Other Standing Exercises ambualtion in // bars, R UE only, stopping to pivot 180d, performed 6x(3 trips)               PT Short Term Goals - 11/28/20 1455      PT SHORT TERM GOAL #1   Title Pt will initiate HEP in order to indicate improved functional mobility and decreased fall risk.  ALL STGS DUE 11/21/20    Baseline reviewed HEP on 11/28/20    Time 4    Period Weeks    Status Achieved    Target Date 11/21/20      PT SHORT TERM GOAL #2   Title Pt will perform 5x sit <> stand in 17 seconds or less with BUE support in order to demo improved functional strength.    Baseline 19.31 seconds, 10.97 seconds with BUE support on 11/28/20    Time 4    Period Weeks    Status Achieved      PT SHORT TERM GOAL #3   Title Pt will undergo BERG with STG and LTG written as appropriate.    Baseline 10/27/20 BERG 37/56    Time 4    Period Weeks    Status Achieved      PT SHORT TERM GOAL #4   Title Pt will improve gait speed with rollator to at least 2.5 ft/sec in order demo improved community mobility.    Baseline 2.18 ft/sec, 14.41 seconds = 2.27 ft/sec on 3/22    Time 4    Period Weeks    Status Not Met      PT SHORT TERM GOAL #5   Title Pt and pt's spouse will verbalize understanding of fall prevention in the home to  decr fall risk.    Time 4  Period Weeks    Status New             PT Long Term Goals - 01/02/21 1707      PT LONG TERM GOAL #1   Title Pt will be independent with final HEP in order to indicate improved functional mobility and decreased fall risk.  ALL LTGS DUE 01/30/21    Baseline updated on 01/02/21 - will benefit from on going additions    Time 4    Period Weeks    Status On-going    Target Date 01/30/21      PT LONG TERM GOAL #2   Title Pt to improve BERG score to at least a 44/56 in order to demo decr fall risk.    Baseline 37; 41/56 on 12/19/20    Time 4    Period Weeks    Status Revised      PT LONG TERM GOAL #3   Title Pt will improve gait speed to at least 2.1 ft/sec in order to demo improved gait efficiency.    Baseline 1.92 ft/sec with SPC with quad tip    Time 4    Period Weeks    Status Revised      PT LONG TERM GOAL #4   Title Pt will ambulate at least 230' over indoor surfaces with cane vs. LRAD with supervision in order to demo improved household mobility.    Baseline supervision/min guard with SPC with quad tip on 12/19/20    Time 4    Period Weeks    Status Revised      PT LONG TERM GOAL #5   Title Pt will perform TUG in 13.5 seconds or less with SPC with quad tip in order to demo decr fall risk.    Baseline 14.5 seconds with SPC with 4 prong tip    Time 4    Period Weeks    Status Revised                 Plan - 01/04/21 1626    Clinical Impression Statement Todays rehab session consisted of addressing defcits noted on functionaal testing including balance deficits and pivoting, ambulation across compliant surfaces using UE support as little as possible, several LOB episodes noted due to suspected hypersensitivity of his vestibular system and righting reflexes, focused on LE strengthening, SLS tasks and discussed need of compliance with HEP    Personal Factors and Comorbidities Comorbidity 3+;Past/Current Experience    Comorbidities HTN,  CAD, COPD, A-fib, hx of COVID march 2021    Examination-Activity Limitations Stand;Squat;Transfers;Reach Overhead;Locomotion Level    Examination-Participation Restrictions Community Activity;Driving    Stability/Clinical Decision Making Evolving/Moderate complexity    Rehab Potential Good    PT Frequency 2x / week    PT Duration 8 weeks    PT Treatment/Interventions ADLs/Self Care Home Management;DME Instruction;Gait training;Functional mobility training;Stair training;Neuromuscular re-education;Balance training;Therapeutic exercise;Therapeutic activities;Patient/family education;Vestibular;Energy conservation    PT Next Visit Plan how is new HEP? work on functional BLE strengthening, balance with narrow BOS and SLS, compliant surfaces.    Consulted and Agree with Plan of Care Patient           Patient will benefit from skilled therapeutic intervention in order to improve the following deficits and impairments:  Abnormal gait,Decreased activity tolerance,Decreased balance,Decreased endurance,Decreased coordination,Decreased cognition,Decreased knowledge of use of DME,Decreased range of motion,Difficulty walking,Decreased safety awareness,Decreased strength  Visit Diagnosis: Unsteadiness on feet  Other lack of coordination  Muscle weakness (generalized)  Other abnormalities of  gait and mobility     Problem List Patient Active Problem List   Diagnosis Date Noted  . AAA (abdominal aortic aneurysm) without rupture ([place]) 11/14/2020  . Subdural hematoma ([place]) 08/15/2020  . Iron deficiency anemia due to chronic blood loss 01/06/2019  . Orthostatic hypotension 01/05/2019    Class: Question of  . Normocytic normochromic anemia 01/05/2019  . Near syncope 01/05/2019  . Chronic respiratory failure with hypoxia ([place]) 06/30/2018  . OSA (obstructive sleep apnea) 02/11/2018  . Physical deconditioning 08/11/2017  . Shortness of breath 10/24/2014  . COPD (chronic obstructive pulmonary  disease) with emphysema ([place]) 09/01/2014  . Fatigue 10/14/2011  . Atrial fibrillation ([place]) 05/15/2011  . Premature ventricular contractions (PVCs) ([facility]) 02/25/2011  . 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  . PERSISTENT DISORDER INITIATING/MAINTAINING SLEEP 05/10/2010  . HYPERCHOLESTEROLEMIA 05/09/2010  . GOUT 05/09/2010  . OBESITY 05/09/2010  . Obstructive sleep apnea 05/09/2010  . Essential hypertension 05/09/2010  . VENTRICULAR TACHYCARDIA 05/09/2010    Lanice Shirts 01/05/2021, 6:36 AM  [facility] [address] [place], [place], [address] Phone: [phone]   Fax:  [phone]  Name: SUHAN PACI MRN: [id number] Date of Birth: April 28, 1934

## 2021-01-08 ENCOUNTER — Ambulatory Visit: Payer: Medicare Other | Admitting: Occupational Therapy

## 2021-01-08 ENCOUNTER — Encounter: Payer: Self-pay | Admitting: Occupational Therapy

## 2021-01-08 ENCOUNTER — Ambulatory Visit: Payer: Medicare Other | Attending: Physician Assistant

## 2021-01-08 ENCOUNTER — Ambulatory Visit: Payer: Medicare Other

## 2021-01-08 ENCOUNTER — Other Ambulatory Visit: Payer: Self-pay

## 2021-01-08 DIAGNOSIS — M6281 Muscle weakness (generalized): Secondary | ICD-10-CM | POA: Insufficient documentation

## 2021-01-08 DIAGNOSIS — R41844 Frontal lobe and executive function deficit: Secondary | ICD-10-CM | POA: Diagnosis present

## 2021-01-08 DIAGNOSIS — R41841 Cognitive communication deficit: Secondary | ICD-10-CM | POA: Diagnosis present

## 2021-01-08 DIAGNOSIS — R2681 Unsteadiness on feet: Secondary | ICD-10-CM | POA: Diagnosis present

## 2021-01-08 DIAGNOSIS — R41840 Attention and concentration deficit: Secondary | ICD-10-CM

## 2021-01-08 DIAGNOSIS — Z9181 History of falling: Secondary | ICD-10-CM | POA: Diagnosis present

## 2021-01-08 DIAGNOSIS — R278 Other lack of coordination: Secondary | ICD-10-CM

## 2021-01-08 DIAGNOSIS — R2689 Other abnormalities of gait and mobility: Secondary | ICD-10-CM | POA: Diagnosis present

## 2021-01-08 NOTE — Therapy (Signed)
[facility] [address] [place], [place], [address] Phone: [phone]   Fax:  [phone]  Physical Therapy Treatment  Patient Details  Name: [name] MRN: [id number] Date of Birth: [birth year] Referring Provider (PT): Dohmeier, Asencion Partridge, MD   Encounter Date: 01/08/2021   PT End of Session - 01/08/21 1711    Visit Number 18    Number of Visits 23    Date for PT Re-Evaluation 03/03/21    Authorization Type UHC Medicare    PT Start Time 1615    PT Stop Time 1700    PT Time Calculation (min) 45 min    Equipment Utilized During Treatment Gait belt    Activity Tolerance Patient tolerated treatment well    Behavior During Therapy [facility] for tasks assessed/performed           Past Medical History:  Diagnosis Date  . Afib ([place])   . Arthritis   . BPH (benign prostatic hyperplasia)   . COPD (chronic obstructive pulmonary disease) ([place])   . Coronary artery disease   . Coronary artery ectasia    Mild CAD with normal systolic function per cath in August of 2011  . Depression   . Diaphragmatic paralysis    felt to be partially responsible for dyspnea  . Dyspnea    due to paralyzed diaphragm and pulmonary issues  . Dysrhythmia   . GERD (gastroesophageal reflux disease)   . Gout   . HTN (hypertension)   . Hypercholesteremia   . Hypertension   . Iron deficiency anemia   . Microhematuria   . Obesity   . OSA (obstructive sleep apnea)    CPAP  . Paroxysmal atrial fibrillation (HCC)    occured in the setting of acute E Coli sepsis and ileius 7/12  . PVC (premature ventricular contraction)    s/p PVC ablation 05/29/2010    Past Surgical History:  Procedure Laterality Date  . APPENDECTOMY  1978  . BIOPSY  02/09/2019   Procedure: BIOPSY;  Surgeon: Clarene Essex, MD;  Location: [facility];  Service: Endoscopy;;  . BIOPSY  03/02/2019   Procedure: BIOPSY;  Surgeon: Clarene Essex, MD;  Location: [facility];  Service:  Endoscopy;;  . CARDIAC CATHETERIZATION  04-30-2010   Left main coronary artery is normal.   . CARDIOVASCULAR STRESS TEST  03-19-2010   0%  . CHOLECYSTECTOMY  1990  . COLONOSCOPY N/A 03/02/2019   Procedure: COLONOSCOPY;  Surgeon: Clarene Essex, MD;  [facility];  Service: Endoscopy;  Laterality: N/A;  . COLONOSCOPY W/ POLYPECTOMY    . ESOPHAGOGASTRODUODENOSCOPY (EGD) WITH PROPOFOL N/A 02/09/2019   Procedure: ESOPHAGOGASTRODUODENOSCOPY (EGD) WITH PROPOFOL;  Surgeon: Clarene Essex, MD;  [facility];  Service: Endoscopy;  Laterality: N/A;  . EYE SURGERY Right    catarct  . poly removed from nose as a child    . PVC ablation  05/29/2010  . RADIOLOGY WITH ANESTHESIA N/A 11/07/2020   Procedure: MRV HEAD WITH AND WITHOUT;  Surgeon: Radiologist, Medication, MD;  Location: [facility];  Service: Radiology;  Laterality: N/A;  . RADIOLOGY WITH ANESTHESIA N/A 11/07/2020   Procedure: CT LUMBAR SPINE WITHOUT;  Surgeon: Radiologist, Medication, MD;  Location: [facility];  Service: Radiology;  Laterality: N/A;  . US ECHOCARDIOGRAPHY  03-09-2010   EF 55-60%    There were no vitals filed for this visit.   Subjective Assessment - 01/08/21 1622    Subjective no new c/o, no falls or med changes to report  Patient is accompained by: Family member   wife, [name]   Pertinent History HTN, CAD, COPD, A-fib    Limitations Standing;Walking    How long can you sit comfortably? 36'    How long can you stand comfortably? 40'    How long can you walk comfortably? 63'    Patient Stated Goals wants to improve his balance    Pain Onset Today           Printed current copy of HEP and reviewed program with patient                  [facility] Adult PT Treatment/Exercise - 01/08/21 0001      Knee/Hip Exercises: Aerobic   Nustep L 2 arms 10 8'               Balance Exercises - 01/08/21 0001      Balance Exercises: Standing   Standing Eyes Opened Wide (BOA);Foam/compliant surface;30  secs;Limitations    Standing Eyes Opened Time on rockerboard, 30s static stand, 15x WS ea. direction    Rockerboard Anterior/posterior;Lateral;EO;30 seconds;10 reps;UE support;Limitations    Step Ups Forward;UE support 1;Limitations    Step Ups Limitations performed as step ups, 10x ea. direction    Retro Gait Foam/compliant surface;5 reps;Limitations    Retro Gait Limitations 5 trips in // bars tandem gait    Sidestepping Foam/compliant support;5 reps;Limitations    Sidestepping Limitations 5 trips in // bars no UE support    Other Standing Exercises 8 pivot turns on blue mat in // bars with cues to turn as quickly but safely as possible               PT Short Term Goals - 11/28/20 1455      PT SHORT TERM GOAL #1   Title Pt will initiate HEP in order to indicate improved functional mobility and decreased fall risk.  ALL STGS DUE 11/21/20    Baseline reviewed HEP on 11/28/20    Time 4    Period Weeks    Status Achieved    Target Date 11/21/20      PT SHORT TERM GOAL #2   Title Pt will perform 5x sit <> stand in 17 seconds or less with BUE support in order to demo improved functional strength.    Baseline 19.31 seconds, 10.97 seconds with BUE support on 11/28/20    Time 4    Period Weeks    Status Achieved      PT SHORT TERM GOAL #3   Title Pt will undergo BERG with STG and LTG written as appropriate.    Baseline 10/27/20 BERG 37/56    Time 4    Period Weeks    Status Achieved      PT SHORT TERM GOAL #4   Title Pt will improve gait speed with rollator to at least 2.5 ft/sec in order demo improved community mobility.    Baseline 2.18 ft/sec, 14.41 seconds = 2.27 ft/sec on 3/22    Time 4    Period Weeks    Status Not Met      PT SHORT TERM GOAL #5   Title Pt and pt's spouse will verbalize understanding of fall prevention in the home to decr fall risk.    Time 4    Period Weeks    Status New             PT Long Term Goals - 01/02/21 1707      PT  LONG TERM GOAL #1    Title Pt will be independent with final HEP in order to indicate improved functional mobility and decreased fall risk.  ALL LTGS DUE 01/30/21    Baseline updated on 01/02/21 - will benefit from on going additions    Time 4    Period Weeks    Status On-going    Target Date 01/30/21      PT LONG TERM GOAL #2   Title Pt to improve BERG score to at least a 44/56 in order to demo decr fall risk.    Baseline 37; 41/56 on 12/19/20    Time 4    Period Weeks    Status Revised      PT LONG TERM GOAL #3   Title Pt will improve gait speed to at least 2.1 ft/sec in order to demo improved gait efficiency.    Baseline 1.92 ft/sec with SPC with quad tip    Time 4    Period Weeks    Status Revised      PT LONG TERM GOAL #4   Title Pt will ambulate at least 230' over indoor surfaces with cane vs. LRAD with supervision in order to demo improved household mobility.    Baseline supervision/min guard with SPC with quad tip on 12/19/20    Time 4    Period Weeks    Status Revised      PT LONG TERM GOAL #5   Title Pt will perform TUG in 13.5 seconds or less with SPC with quad tip in order to demo decr fall risk.    Baseline 14.5 seconds with SPC with 4 prong tip    Time 4    Period Weeks    Status Revised                 Plan - 01/08/21 1628    Clinical Impression Statement Todays skilled session continued to build on gait and balance training, focus on stepping and pivot turns, less apprehension noted with balance challenges today but still unable to perform SLS activities w/o UE support, able to perform sidestepping on blue foam w/o UE support when cued for pacing and breathing    Personal Factors and Comorbidities Comorbidity 3+;Past/Current Experience    Comorbidities HTN, CAD, COPD, A-fib, hx of COVID march 2021    Examination-Activity Limitations Stand;Squat;Transfers;Reach Overhead;Locomotion Level    Examination-Participation Restrictions Community Activity;Driving     Stability/Clinical Decision Making Evolving/Moderate complexity    Rehab Potential Good    PT Frequency 2x / week    PT Duration 8 weeks    PT Treatment/Interventions ADLs/Self Care Home Management;DME Instruction;Gait training;Functional mobility training;Stair training;Neuromuscular re-education;Balance training;Therapeutic exercise;Therapeutic activities;Patient/family education;Vestibular;Energy conservation    PT Next Visit Plan continue work on functional BLE strengthening, balance with narrow BOS and SLS, compliant surfaces, pivot turns    PT Home Exercise Plan [phone]    Consulted and Agree with Plan of Care Patient           Patient will benefit from skilled therapeutic intervention in order to improve the following deficits and impairments:  Abnormal gait,Decreased activity tolerance,Decreased balance,Decreased endurance,Decreased coordination,Decreased cognition,Decreased knowledge of use of DME,Decreased range of motion,Difficulty walking,Decreased safety awareness,Decreased strength  Visit Diagnosis: Muscle weakness (generalized)  Unsteadiness on feet  Other abnormalities of gait and mobility     Problem List Patient Active Problem List   Diagnosis Date Noted  . AAA (abdominal aortic aneurysm) without rupture ([place]) 11/14/2020  . Subdural hematoma ([place]) 08/15/2020  .  Iron deficiency anemia due to chronic blood loss 01/06/2019  . Orthostatic hypotension 01/05/2019    Class: Question of  . Normocytic normochromic anemia 01/05/2019  . Near syncope 01/05/2019  . Chronic respiratory failure with hypoxia ([place]) 06/30/2018  . OSA (obstructive sleep apnea) 02/11/2018  . Physical deconditioning 08/11/2017  . Shortness of breath 10/24/2014  . COPD (chronic obstructive pulmonary disease) with emphysema ([place]) 09/01/2014  . Fatigue 10/14/2011  . Atrial fibrillation ([place]) 05/15/2011  . Premature ventricular contractions (PVCs) (V[facility]) 02/25/2011  . Disorder of diaphragm  06/27/2010  . PERSISTENT DISORDER INITIATING/MAINTAINING SLEEP 05/10/2010  . HYPERCHOLESTEROLEMIA 05/09/2010  . GOUT 05/09/2010  . OBESITY 05/09/2010  . Obstructive sleep apnea 05/09/2010  . Essential hypertension 05/09/2010  . VENTRICULAR TACHYCARDIA 05/09/2010    Lanice Shirts PT 01/08/2021, 5:19 PM  [facility] [address] [place], [place], [address] Phone: [phone]   Fax:  [phone]  Name: [name] MRN: [id number] Date of Birth: [birth year]

## 2021-01-08 NOTE — Therapy (Signed)
[facility] [address] [place], [place], [address] Phone: [phone]   Fax:  [phone]  Occupational Therapy Treatment  Patient Details  Name: [name] MRN: [id number] Date of Birth: 05/22/1934 Referring Provider (OT): Dr. Brett Fairy (referred by hospitalist with send to neurology)   Encounter Date: 01/08/2021   OT End of Session - 01/08/21 1534    Visit Number 15    Number of Visits 25    Date for OT Re-Evaluation 01/24/21    Authorization Type UHC Medicare    Authorization Time Period 90 days, anticipate d/c after 8 weeks .    Authorization - Visit Number 15    Authorization - Number of Visits 20    OT Start Time 1536    OT Stop Time 1615    OT Time Calculation (min) 39 min    Activity Tolerance Patient tolerated treatment well    Behavior During Therapy WFL for tasks assessed/performed           Past Medical History:  Diagnosis Date  . Afib ([place])   . Arthritis   . BPH (benign prostatic hyperplasia)   . COPD (chronic obstructive pulmonary disease) ([place])   . Coronary artery disease   . Coronary artery ectasia    Mild CAD with normal systolic function per cath in August of 2011  . Depression   . Diaphragmatic paralysis    felt to be partially responsible for dyspnea  . Dyspnea    due to paralyzed diaphragm and pulmonary issues  . Dysrhythmia   . GERD (gastroesophageal reflux disease)   . Gout   . HTN (hypertension)   . Hypercholesteremia   . Hypertension   . Iron deficiency anemia   . Microhematuria   . Obesity   . OSA (obstructive sleep apnea)    CPAP  . Paroxysmal atrial fibrillation (HCC)    occured in the setting of acute E Coli sepsis and ileius 7/12  . PVC (premature ventricular contraction)    s/p PVC ablation 05/29/2010    Past Surgical History:  Procedure Laterality Date  . APPENDECTOMY  1978  . BIOPSY  02/09/2019   Procedure: BIOPSY;  Surgeon: Clarene Essex, MD;  [facility];  Service: Endoscopy;;  . BIOPSY  03/02/2019   Procedure: BIOPSY;  Surgeon: Clarene Essex, MD;  [facility];  Service: Endoscopy;;  . CARDIAC CATHETERIZATION  04-30-2010   Left main coronary artery is normal.   . CARDIOVASCULAR STRESS TEST  03-19-2010   0%  . CHOLECYSTECTOMY  1990  . COLONOSCOPY N/A 03/02/2019   Procedure: COLONOSCOPY;  Surgeon: Clarene Essex, MD;  [facility];  Service: Endoscopy;  Laterality: N/A;  . COLONOSCOPY W/ POLYPECTOMY    . ESOPHAGOGASTRODUODENOSCOPY (EGD) WITH PROPOFOL N/A 02/09/2019   Procedure: ESOPHAGOGASTRODUODENOSCOPY (EGD) WITH PROPOFOL;  Surgeon: Clarene Essex, MD;  [facility];  Service: Endoscopy;  Laterality: N/A;  . EYE SURGERY Right    catarct  . poly removed from nose as a child    . PVC ablation  05/29/2010  . RADIOLOGY WITH ANESTHESIA N/A 11/07/2020   Procedure: MRV HEAD WITH AND WITHOUT;  Surgeon: Radiologist, Medication, MD;  Location: [facility];  Service: Radiology;  Laterality: N/A;  . RADIOLOGY WITH ANESTHESIA N/A 11/07/2020   Procedure: CT LUMBAR SPINE WITHOUT;  Surgeon: Radiologist, Medication, MD;  Location: [facility];  Service: Radiology;  Laterality: N/A;  . US ECHOCARDIOGRAPHY  03-09-2010   EF 55-60%    There were no  vitals filed for this visit.   Subjective Assessment - 01/08/21 1536    Subjective  Denies pain    Pertinent History Pt had a fall while shopping in [employer] on 08/15/20. Pt sustained a parafalcine and tentorial SDH. Was discharged from the hospital on 08/17/20.PMH:HTN, CAD, COPD, A-fib    Patient Stated Goals improve balance and strength             [place] with LUE with min difficulty and drops. Pt attempted to pick up multiple and translate to fingertips with moderate difficulty.  Physical/Cognitive Task ambulating around gym and word finding/categorization with animals - pt req'd min-mod cues for approx 20% of the letters. Req'd seated rest break after one lap around gym.   Constant Therapy  Alternating Symbols level 6 with 95% accuracy and 119.72s response time.                  OT Short Term Goals - 12/14/20 1620      OT SHORT TERM GOAL #1   Title Pt/ caregeiver will be I with HEP. 11/29/20    Time 4    Period Weeks    Status Achieved   met, for coordination HEP   Target Date 11/29/20      OT SHORT TERM GOAL #2   Title Pt / wife will verbalize understanding of strategies to increase safety and I with ADLS.    Time 4    Period Weeks    Status Achieved      OT SHORT TERM GOAL #3   Title Pt will perfrom transitional movments for ADLS with close supervision and no LOB.    Baseline histroy of falls    Time 4    Period Weeks    Status Achieved      OT SHORT TERM GOAL #4   Title Pt will demonstrate improved fine motor coordination in his LUE as eveidenced by decreasing 9-hole peg test score by 4 secs. UPDATE: complete in 35 seconds or less with LUE    Baseline 45.72 LUE at eval    Time 4    Period Weeks    Status Revised   39.46s with LUE 12/14/2020     OT SHORT TERM GOAL #5   Title Pt will sequence a simple functional or ADL task  with 90% or better accuracy .    Time 4    Period Weeks    Status Achieved   93% on constant therapy     OT SHORT TERM GOAL #6   Title Pt will demonstrate improved fine motor coordination for ADLs as evidenced by decreasing 3 button/ unbutton time to 90 secs or less.    Time 4    Period Weeks    Status Achieved   49.25            OT Long Term Goals - 01/08/21 1541      OT LONG TERM GOAL #1   Title Pt/ wife will be I with updated HEP for proximal strength.    Time 12    Period Weeks    Status On-going      OT LONG TERM GOAL #2   Title Pt will demonstrate ability to perform simple beverage / snack prep modifeied indpendent demonstraing good saftey awareness.    Time 12    Period Weeks    Status Achieved   pt reports doing this at home     OT [place] #3   Title Pt will  perforrm bathing and  dressing mod  I in a reasonable amout of time    Baseline increased time required    Time 12    Period Weeks    Status Achieved   per pt report     OT LONG TERM GOAL #4   Title Pt will demonstrate adequate bilateral UE strength to retrieve a lightweight object (3 lbs) from eye level  shelf without dropping with right and left UE's individually.    Time 12    Period Weeks    Status Achieved      OT LONG TERM GOAL #5   Title Pt will increase standing functional reach test score to 10 inches or greater with RUE to minimize fall risk during ADLs.    Time 12    Period Weeks    Status On-going   6"     OT LONG TERM GOAL #6   Title Pt will perform physical and cognitive task with 90% accuracy    Time 12    Period Weeks    Status On-going   80% accuracy 01/08/21                Plan - 01/08/21 1542    Clinical Impression Statement Pt progressing towards goals.    OT Occupational Profile and History Detailed Assessment- Review of Records and additional review of physical, cognitive, psychosocial history related to current functional performance    Occupational performance deficits (Please refer to evaluation for details): ADL's;IADL's;Leisure;Social Participation    Body Structure / Function / Physical Skills ADL;UE functional use;Balance;FMC;GMC;Coordination;Decreased knowledge of use of DME;IADL;Dexterity;Strength;Mobility    Cognitive Skills Memory;Safety Awareness;Problem Solve;Sequencing;Thought;Understand;Attention    OT Frequency 2x / week    OT Duration 12 weeks   12 weeks total. renewal completed at week 5/visit 10 to address goals.   OT Treatment/Interventions Self-care/ADL training;Energy conservation;Patient/family education;DME and/or AE instruction;Aquatic Therapy;Paraffin;Passive range of motion;Balance training;Fluidtherapy;Cryotherapy;Therapist, nutritional;Therapeutic activities;Manual Therapy;Therapeutic exercise;Moist Heat;Neuromuscular education;Cognitive  remediation/compensation    Plan dynamic balance and functional reach, progress HEP, consider seated low range theraband exercises for proximal strength/ vs. light hand weights    Consulted and Agree with Plan of Care Patient;Family member/caregiver           Patient will benefit from skilled therapeutic intervention in order to improve the following deficits and impairments:   Body Structure / Function / Physical Skills: ADL,UE functional use,Balance,FMC,GMC,Coordination,Decreased knowledge of use of DME,IADL,Dexterity,Strength,Mobility Cognitive Skills: Memory,Safety Awareness,Problem Solve,Sequencing,Thought,Understand,Attention     Visit Diagnosis: Other lack of coordination  Attention and concentration deficit  Frontal lobe and executive function deficit  Muscle weakness (generalized)  Unsteadiness on feet  Other abnormalities of gait and mobility    Problem List Patient Active Problem List   Diagnosis Date Noted  . AAA (abdominal aortic aneurysm) without rupture ([place]) 11/14/2020  . Subdural hematoma ([place]) 08/15/2020  . Iron deficiency anemia due to chronic blood loss 01/06/2019  . Orthostatic hypotension 01/05/2019    Class: Question of  . Normocytic normochromic anemia 01/05/2019  . Near syncope 01/05/2019  . Chronic respiratory failure with hypoxia ([place]) 06/30/2018  . OSA (obstructive sleep apnea) 02/11/2018  . Physical deconditioning 08/11/2017  . Shortness of breath 10/24/2014  . COPD (chronic obstructive pulmonary disease) with emphysema ([place]) 09/01/2014  . Fatigue 10/14/2011  . Atrial fibrillation ([place]) 05/15/2011  . Premature ventricular contractions (PVCs) ([facility]) 02/25/2011  . 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  . PERSISTENT DISORDER INITIATING/MAINTAINING SLEEP 05/10/2010  . HYPERCHOLESTEROLEMIA 05/09/2010  . GOUT 05/09/2010  .  OBESITY 05/09/2010  . Obstructive sleep apnea 05/09/2010  . Essential hypertension 05/09/2010  . VENTRICULAR TACHYCARDIA  05/09/2010    Zachery Conch MOT, OTR/L  01/08/2021, 3:53 PM  [facility] [address] [place], [place], [address] Phone: [phone]   Fax:  [phone]  Name: HIAWATHA MERRIOTT MRN: [id number] Date of Birth: September 07, 1934

## 2021-01-08 NOTE — Therapy (Signed)
[facility] [address] [place], [place], [address] Phone: [phone]   Fax:  [phone]  Speech Language Pathology Treatment  Patient Details  Name: [name] MRN: [id number] Date of Birth: 01/16/1934 Referring Provider (SLP): Margie Billet, PA-C; Dr. [name] PCP (doc)   Encounter Date: 01/08/2021   End of Session - 01/08/21 1541    Visit Number 11    Number of Visits 17    Date for SLP Re-Evaluation 01/29/21    SLP Start Time 1450    SLP Stop Time  1532    SLP Time Calculation (min) 42 min    Activity Tolerance Patient tolerated treatment well           Past Medical History:  Diagnosis Date  . Afib ([place])   . Arthritis   . BPH (benign prostatic hyperplasia)   . COPD (chronic obstructive pulmonary disease) ([place])   . Coronary artery disease   . Coronary artery ectasia    Mild CAD with normal systolic function per cath in August of 2011  . Depression   . Diaphragmatic paralysis    felt to be partially responsible for dyspnea  . Dyspnea    due to paralyzed diaphragm and pulmonary issues  . Dysrhythmia   . GERD (gastroesophageal reflux disease)   . Gout   . HTN (hypertension)   . Hypercholesteremia   . Hypertension   . Iron deficiency anemia   . Microhematuria   . Obesity   . OSA (obstructive sleep apnea)    CPAP  . Paroxysmal atrial fibrillation ([facility])    occured in the setting of acute E Coli sepsis and ileius 7/12  . PVC (premature ventricular contraction)    s/p PVC ablation 05/29/2010    Past Surgical History:  Procedure Laterality Date  . APPENDECTOMY  1978  . BIOPSY  02/09/2019   Procedure: BIOPSY;  Surgeon: Clarene Essex, MD;  Location: [facility];  Service: Endoscopy;;  . BIOPSY  03/02/2019   Procedure: BIOPSY;  Surgeon: Clarene Essex, MD;  Location: [facility];  Service: Endoscopy;;  . CARDIAC CATHETERIZATION  04-30-2010   Left main coronary artery is normal.   .  CARDIOVASCULAR STRESS TEST  03-19-2010   0%  . CHOLECYSTECTOMY  1990  . COLONOSCOPY N/A 03/02/2019   Procedure: COLONOSCOPY;  Surgeon: Clarene Essex, MD;  [facility];  Service: Endoscopy;  Laterality: N/A;  . COLONOSCOPY W/ POLYPECTOMY    . ESOPHAGOGASTRODUODENOSCOPY (EGD) WITH PROPOFOL N/A 02/09/2019   Procedure: ESOPHAGOGASTRODUODENOSCOPY (EGD) WITH PROPOFOL;  Surgeon: Clarene Essex, MD;  [facility];  Service: Endoscopy;  Laterality: N/A;  . EYE SURGERY Right    catarct  . poly removed from nose as a child    . PVC ablation  05/29/2010  . RADIOLOGY WITH ANESTHESIA N/A 11/07/2020   Procedure: MRV HEAD WITH AND WITHOUT;  Surgeon: Radiologist, Medication, MD;  Location: [facility];  Service: Radiology;  Laterality: N/A;  . RADIOLOGY WITH ANESTHESIA N/A 11/07/2020   Procedure: CT LUMBAR SPINE WITHOUT;  Surgeon: Radiologist, Medication, MD;  Location: [facility];  Service: Radiology;  Laterality: N/A;  . US ECHOCARDIOGRAPHY  03-09-2010   EF 55-60%    There were no vitals filed for this visit.   Subjective Assessment - 01/08/21 1453    Subjective "How was your weekend?"    Currently in Pain? No/denies                 ADULT SLP TREATMENT - 01/08/21  [facility]   Behavior/Cognition Alert;Cooperative;Pleasant mood      Treatment Provided   Treatment provided Cognitive-Linquistic      Cognitive-Linquistic Treatment   Treatment focused on Cognition    Skilled Treatment "I almost didn't remember to bring (my homework)." Pt used his alarm to remember his PT exercises but only did them once/day - PT states BID is what is req'd. Pt tells SLP he corrected his homework, however pt still with errors on 50% of problems due to decr'd attention. Pt also did not complete one of the problems due to decr'd attention to detail. Req'd SLP max A consistently to correct his errors.     Assessment / Recommendations / Plan   Plan Continue with current plan of care   d/c in next 4  weeks     Progression Toward Goals   Progression toward goals Not progressing toward goals (comment)   severity of attention             SLP Short Term Goals - 01/02/21 1704      SLP SHORT TERM GOAL #1   Title pt will perform functional reading and/or written tasks for 7 minutes in 2 sessions    Baseline 12-19-20    Status Achieved      SLP SHORT TERM GOAL #2   Title pt will process verbal or written directions with 80% accuracy with occasional min A x3 vistis    Baseline 12-05-20, 12-12-20    Status Achieved      SLP SHORT TERM GOAL #3   Title pt will verbally state 3 strategies to improve memory skills in 3 sessions    Baseline 12-12-20 (phone alarm), 12-19-20 (visual cue - blue folder)    Status Partially Met      SLP SHORT TERM GOAL #4   Title pt will demo understanding of his non-physical deficits folloiwng brain bleed with modified independence in 3 sessions    Baseline 12-12-20, 12-21-20    Status Partially Met            SLP Long Term Goals - 01/08/21 1603      SLP LONG TERM GOAL #1   Title Pt will use memory compensation system for daily tasks when appropriate, x1 in 4 sessions    Baseline 12-19-20 (PT HEP), 01-08-21 (HEP)    Time 3    Period Weeks   or 17 visits, for all LTGs   Status On-going      SLP LONG TERM GOAL #2   Title pt will demonstrate error awareness in linguistic tasks 90% of the time with nonverbal cues in 3 sessions    Time 3    Period Weeks    Status On-going      SLP LONG TERM GOAL #3   Title pt and/or wife will report pt has completed tasks demonstrating ability to solve problems in the home environment x3 sessions    Time 3    Period Weeks    Status On-going      SLP LONG TERM GOAL #4   Title pt will show knowledge of compensating for cognitive deficits in linguistic tasks    Time 3    Period Weeks    Status On-going            Plan - 01/08/21 1542    Clinical Impression Statement [name] presents today with cognitive  communication deficits in areas of attention, problem solving, processing, awareness, and memory.  SLP continues to see [name] struggle with mod complex tasks (2-step -e.g., figuring meal total and then figure tip to add for total bill), or with simple novel tasks. Pt severity of deficit is becoming more of an issue in progression of cognitive goals. [name] will be seen until the end of May 2022 and then consideration will be made to d/c at highest current rehab level, or to renew for more visits if SLP believes this will benefit pt.    Speech Therapy Frequency 2x / week    Duration 8 weeks   or 17 total sessions   Treatment/Interventions Language facilitation;Compensatory techniques;Internal/external aids;SLP instruction and feedback;Patient/family education;Environmental controls;Cognitive reorganization    Potential to Achieve Goals Good    Consulted and Agree with Plan of Care Patient;Family member/caregiver           Patient will benefit from skilled therapeutic intervention in order to improve the following deficits and impairments:   Cognitive communication deficit    Problem List Patient Active Problem List   Diagnosis Date Noted  . AAA (abdominal aortic aneurysm) without rupture ([place]) 11/14/2020  . Subdural hematoma ([place]) 08/15/2020  . Iron deficiency anemia due to chronic blood loss 01/06/2019  . Orthostatic hypotension 01/05/2019    Class: Question of  . Normocytic normochromic anemia 01/05/2019  . Near syncope 01/05/2019  . Chronic respiratory failure with hypoxia ([place]) 06/30/2018  . OSA (obstructive sleep apnea) 02/11/2018  . Physical deconditioning 08/11/2017  . Shortness of breath 10/24/2014  . COPD (chronic obstructive pulmonary disease) with emphysema ([place]) 09/01/2014  . Fatigue 10/14/2011  . Atrial fibrillation ([place]) 05/15/2011  . Premature ventricular contractions (PVCs) ([facility]) 02/25/2011  . 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  . PERSISTENT DISORDER  INITIATING/MAINTAINING SLEEP 05/10/2010  . HYPERCHOLESTEROLEMIA 05/09/2010  . GOUT 05/09/2010  . OBESITY 05/09/2010  . Obstructive sleep apnea 05/09/2010  . Essential hypertension 05/09/2010  . VENTRICULAR TACHYCARDIA 05/09/2010    [facility] ,MS, CCC-SLP  01/08/2021, 4:05 PM  [facility] [address] [place], [place], [address] Phone: [phone]   Fax:  [phone]   Name: [name] MRN: [id number] Date of Birth: 1934/05/31

## 2021-01-08 NOTE — Patient Instructions (Addendum)
  Merry Proud (your PT) wants you to do your PT exercises twice a day. You said you have them done once a day pretty consistently - GREAT! Please set an alarm in your phone for another time each day when you think you could do them. Ask a family member for help if you need to. Maybe your daughter(?)  Please complete the assigned speech therapy homework prior to your next session and return it to the speech therapist at your next visit.

## 2021-01-09 ENCOUNTER — Ambulatory Visit: Payer: Medicare Other | Admitting: Physical Therapy

## 2021-01-09 ENCOUNTER — Encounter: Payer: Medicare Other | Admitting: Occupational Therapy

## 2021-01-10 ENCOUNTER — Ambulatory Visit: Payer: Medicare Other

## 2021-01-10 ENCOUNTER — Ambulatory Visit: Payer: Medicare Other | Admitting: Occupational Therapy

## 2021-01-10 ENCOUNTER — Ambulatory Visit: Payer: Medicare Other | Admitting: Physical Therapy

## 2021-01-10 ENCOUNTER — Other Ambulatory Visit: Payer: Self-pay

## 2021-01-10 ENCOUNTER — Encounter: Payer: Self-pay | Admitting: Occupational Therapy

## 2021-01-10 ENCOUNTER — Encounter: Payer: Self-pay | Admitting: Physical Therapy

## 2021-01-10 DIAGNOSIS — R2681 Unsteadiness on feet: Secondary | ICD-10-CM

## 2021-01-10 DIAGNOSIS — M6281 Muscle weakness (generalized): Secondary | ICD-10-CM

## 2021-01-10 DIAGNOSIS — R41844 Frontal lobe and executive function deficit: Secondary | ICD-10-CM

## 2021-01-10 DIAGNOSIS — R278 Other lack of coordination: Secondary | ICD-10-CM

## 2021-01-10 DIAGNOSIS — R41841 Cognitive communication deficit: Secondary | ICD-10-CM

## 2021-01-10 DIAGNOSIS — R41840 Attention and concentration deficit: Secondary | ICD-10-CM

## 2021-01-10 NOTE — Therapy (Signed)
[facility] [address] [place], [place], [address] Phone: [phone]   Fax:  [phone]  Physical Therapy Treatment  Patient Details  Name: [name] MRN: [id number] Date of Birth: 17-Jun-1934 Referring Provider (PT): Dohmeier, Asencion Partridge, MD   Encounter Date: 01/10/2021   PT End of Session - 01/10/21 1707    Visit Number 19    Number of Visits 24   miscounted visits initially for POC frequency   Date for PT Re-Evaluation 03/03/21    Authorization Type [employer] Medicare    PT Start Time 1618    PT Stop Time 1658    PT Time Calculation (min) 40 min    Equipment Utilized During Treatment Gait belt    Activity Tolerance Patient tolerated treatment well    Behavior During Therapy [facility] for tasks assessed/performed           Past Medical History:  Diagnosis Date  . Afib ([place])   . Arthritis   . BPH (benign prostatic hyperplasia)   . COPD (chronic obstructive pulmonary disease) ([place])   . Coronary artery disease   . Coronary artery ectasia    Mild CAD with normal systolic function per cath in August of 2011  . Depression   . Diaphragmatic paralysis    felt to be partially responsible for dyspnea  . Dyspnea    due to paralyzed diaphragm and pulmonary issues  . Dysrhythmia   . GERD (gastroesophageal reflux disease)   . Gout   . HTN (hypertension)   . Hypercholesteremia   . Hypertension   . Iron deficiency anemia   . Microhematuria   . Obesity   . OSA (obstructive sleep apnea)    CPAP  . Paroxysmal atrial fibrillation ([facility])    occured in the setting of acute E Coli sepsis and ileius 7/12  . PVC (premature ventricular contraction)    s/p PVC ablation 05/29/2010    Past Surgical History:  Procedure Laterality Date  . APPENDECTOMY  1978  . BIOPSY  02/09/2019   Procedure: BIOPSY;  Surgeon: Clarene Essex, MD;  [facility];  Service: Endoscopy;;  . BIOPSY  03/02/2019   Procedure: BIOPSY;  Surgeon: Clarene Essex, MD;  [facility];  Service: Endoscopy;;  . CARDIAC CATHETERIZATION  04-30-2010   Left main coronary artery is normal.   . CARDIOVASCULAR STRESS TEST  03-19-2010   0%  . CHOLECYSTECTOMY  1990  . COLONOSCOPY N/A 03/02/2019   Procedure: COLONOSCOPY;  Surgeon: Clarene Essex, MD;  [facility];  Service: Endoscopy;  Laterality: N/A;  . COLONOSCOPY W/ POLYPECTOMY    . ESOPHAGOGASTRODUODENOSCOPY (EGD) WITH PROPOFOL N/A 02/09/2019   Procedure: ESOPHAGOGASTRODUODENOSCOPY (EGD) WITH PROPOFOL;  Surgeon: Clarene Essex, MD;  [facility];  Service: Endoscopy;  Laterality: N/A;  . EYE SURGERY Right    catarct  . poly removed from nose as a child    . PVC ablation  05/29/2010  . RADIOLOGY WITH ANESTHESIA N/A 11/07/2020   Procedure: MRV HEAD WITH AND WITHOUT;  Surgeon: Radiologist, Medication, MD;  Location: [facility];  Service: Radiology;  Laterality: N/A;  . RADIOLOGY WITH ANESTHESIA N/A 11/07/2020   Procedure: CT LUMBAR SPINE WITHOUT;  Surgeon: Radiologist, Medication, MD;  Location: [facility];  Service: Radiology;  Laterality: N/A;  . US ECHOCARDIOGRAPHY  03-09-2010   EF 55-60%    There were no vitals filed for this visit.   Subjective Assessment - 01/10/21 1621    Subjective Nothing new, no falls. Exercises  are going well at home.    Patient is accompained by: Family member   wife, [name]   Pertinent History HTN, CAD, COPD, A-fib    Limitations Standing;Walking    How long can you sit comfortably? 22'    How long can you stand comfortably? 92'    How long can you walk comfortably? 96'    Patient Stated Goals wants to improve his balance    Currently in Pain? No/denies    Pain Onset Today                             [facility] Adult PT Treatment/Exercise - 01/10/21 1621      Knee/Hip Exercises: Aerobic   Nustep with BLE/BUE for strengthening, ROM, activity tolerance at gear 2.0 for 6 minutes               Balance Exercises - 01/10/21 1629      Balance  Exercises: Standing   SLS with Vectors Solid surface;Intermittent upper extremity assist;Upper extremity assist 1;Limitations    SLS with Vectors Limitations alternating taps to 6" step x10 reps B, needing intermittent UE support    Stepping Strategy Anterior;Posterior;Foam/compliant surface;UE support;Limitations    Stepping Strategy Limitations on blue foam beam, alternating each direction x12 reps B, from UE support > fingertip support and then attempting with none with min guard/min A from therapist    Rockerboard Lateral;EO;Limitations    Rockerboard Limitations shifting weight x12 reps, keeping board steady x30 seconds, then alternating arm raises x7 reps B, min guard as needed for balance    Step Ups Forward;UE support 2;6 inch    Step Ups Limitations x5 reps B, alternating, incr difficulty stepping up with LLE    Sidestepping Foam/compliant support;2 reps    Sidestepping Limitations down and back x2 reps with intermittent UE support, cues for incr foot clearance on blue foam beam    Other Standing Exercises x10 reps sit <> stands on blue air ex; using UE support to stand and none to sit               PT Short Term Goals - 11/28/20 1455      PT SHORT TERM GOAL #1   Title Pt will initiate HEP in order to indicate improved functional mobility and decreased fall risk.  ALL STGS DUE 11/21/20    Baseline reviewed HEP on 11/28/20    Time 4    Period Weeks    Status Achieved    Target Date 11/21/20      PT SHORT TERM GOAL #2   Title Pt will perform 5x sit <> stand in 17 seconds or less with BUE support in order to demo improved functional strength.    Baseline 19.31 seconds, 10.97 seconds with BUE support on 11/28/20    Time 4    Period Weeks    Status Achieved      PT SHORT TERM GOAL #3   Title Pt will undergo BERG with STG and LTG written as appropriate.    Baseline 10/27/20 BERG 37/56    Time 4    Period Weeks    Status Achieved      PT SHORT TERM GOAL #4   Title Pt will  improve gait speed with rollator to at least 2.5 ft/sec in order demo improved community mobility.    Baseline 2.18 ft/sec, 14.41 seconds = 2.27 ft/sec on 3/22    Time 4  Period Weeks    Status Not Met      PT SHORT TERM GOAL #5   Title Pt and pt's spouse will verbalize understanding of fall prevention in the home to decr fall risk.    Time 4    Period Weeks    Status New             PT Long Term Goals - 01/02/21 1707      PT LONG TERM GOAL #1   Title Pt will be independent with final HEP in order to indicate improved functional mobility and decreased fall risk.  ALL LTGS DUE 01/30/21    Baseline updated on 01/02/21 - will benefit from on going additions    Time 4    Period Weeks    Status On-going    Target Date 01/30/21      PT LONG TERM GOAL #2   Title Pt to improve BERG score to at least a 44/56 in order to demo decr fall risk.    Baseline 37; 41/56 on 12/19/20    Time 4    Period Weeks    Status Revised      PT LONG TERM GOAL #3   Title Pt will improve gait speed to at least 2.1 ft/sec in order to demo improved gait efficiency.    Baseline 1.92 ft/sec with SPC with quad tip    Time 4    Period Weeks    Status Revised      PT LONG TERM GOAL #4   Title Pt will ambulate at least 230' over indoor surfaces with cane vs. LRAD with supervision in order to demo improved household mobility.    Baseline supervision/min guard with SPC with quad tip on 12/19/20    Time 4    Period Weeks    Status Revised      PT LONG TERM GOAL #5   Title Pt will perform TUG in 13.5 seconds or less with SPC with quad tip in order to demo decr fall risk.    Baseline 14.5 seconds with SPC with 4 prong tip    Time 4    Period Weeks    Status Revised                 Plan - 01/10/21 1714    Clinical Impression Statement Today's skilled session focused on BLE strengthening and balance strategies on compliant surfaces and with SLS. Pt improving with SLS, able to perform some reps of  SLS foot taps today without UE support, but needing min guard for balance. Intermittent standing/seated rest breaks taken throughout due to SOB.    Personal Factors and Comorbidities Comorbidity 3+;Past/Current Experience    Comorbidities HTN, CAD, COPD, A-fib, hx of COVID march 2021    Examination-Activity Limitations Stand;Squat;Transfers;Reach Overhead;Locomotion Level    Examination-Participation Restrictions Community Activity;Driving    Stability/Clinical Decision Making Evolving/Moderate complexity    Rehab Potential Good    PT Frequency 2x / week    PT Duration 8 weeks    PT Treatment/Interventions ADLs/Self Care Home Management;DME Instruction;Gait training;Functional mobility training;Stair training;Neuromuscular re-education;Balance training;Therapeutic exercise;Therapeutic activities;Patient/family education;Vestibular;Energy conservation    PT Next Visit Plan will need 10th visit PN. continue work on functional BLE strengthening, balance with narrow BOS and SLS, compliant surfaces, pivot turns, step ups    PT Home Exercise Plan [phone]    Consulted and Agree with Plan of Care Patient           Patient will benefit  from skilled therapeutic intervention in order to improve the following deficits and impairments:  Abnormal gait,Decreased activity tolerance,Decreased balance,Decreased endurance,Decreased coordination,Decreased cognition,Decreased knowledge of use of DME,Decreased range of motion,Difficulty walking,Decreased safety awareness,Decreased strength  Visit Diagnosis: Other lack of coordination  Muscle weakness (generalized)  Unsteadiness on feet     Problem List Patient Active Problem List   Diagnosis Date Noted  . AAA (abdominal aortic aneurysm) without rupture ([place]) 11/14/2020  . Subdural hematoma ([place]) 08/15/2020  . Iron deficiency anemia due to chronic blood loss 01/06/2019  . Orthostatic hypotension 01/05/2019    Class: Question of  . Normocytic  normochromic anemia 01/05/2019  . Near syncope 01/05/2019  . Chronic respiratory failure with hypoxia ([place]) 06/30/2018  . OSA (obstructive sleep apnea) 02/11/2018  . Physical deconditioning 08/11/2017  . Shortness of breath 10/24/2014  . COPD (chronic obstructive pulmonary disease) with emphysema ([place]) 09/01/2014  . Fatigue 10/14/2011  . Atrial fibrillation ([place]) 05/15/2011  . Premature ventricular contractions (PVCs) ([facility]) 02/25/2011  . 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  . PERSISTENT DISORDER INITIATING/MAINTAINING SLEEP 05/10/2010  . HYPERCHOLESTEROLEMIA 05/09/2010  . GOUT 05/09/2010  . OBESITY 05/09/2010  . Obstructive sleep apnea 05/09/2010  . Essential hypertension 05/09/2010  . VENTRICULAR TACHYCARDIA 05/09/2010    Arliss Journey, PT, DPT  01/10/2021, 5:16 PM  [facility] [address] [place], [place], [address] Phone: [phone]   Fax:  [phone]  Name: IVIS HENNEMAN MRN: [id number] Date of Birth: [birth year]

## 2021-01-10 NOTE — Therapy (Signed)
[facility] [address] [place], [place], [address] Phone: [phone]   Fax:  [phone]  Speech Language Pathology Treatment  Patient Details  Name: [name] MRN: [id number] Date of Birth: 1933/10/20 Referring Provider (SLP): Margie Billet, PA-C; Dr. [name] PCP (doc)   Encounter Date: 01/10/2021   End of Session - 01/10/21 1636    Visit Number 12    Number of Visits 17    Date for SLP Re-Evaluation 01/29/21    SLP Start Time 1533    SLP Stop Time  1615    SLP Time Calculation (min) 42 min    Activity Tolerance Patient tolerated treatment well           Past Medical History:  Diagnosis Date  . Afib ([place])   . Arthritis   . BPH (benign prostatic hyperplasia)   . COPD (chronic obstructive pulmonary disease) ([place])   . Coronary artery disease   . Coronary artery ectasia    Mild CAD with normal systolic function per cath in August of 2011  . Depression   . Diaphragmatic paralysis    felt to be partially responsible for dyspnea  . Dyspnea    due to paralyzed diaphragm and pulmonary issues  . Dysrhythmia   . GERD (gastroesophageal reflux disease)   . Gout   . HTN (hypertension)   . Hypercholesteremia   . Hypertension   . Iron deficiency anemia   . Microhematuria   . Obesity   . OSA (obstructive sleep apnea)    CPAP  . Paroxysmal atrial fibrillation ([facility])    occured in the setting of acute E Coli sepsis and ileius 7/12  . PVC (premature ventricular contraction)    s/p PVC ablation 05/29/2010    Past Surgical History:  Procedure Laterality Date  . APPENDECTOMY  1978  . BIOPSY  02/09/2019   Procedure: BIOPSY;  Surgeon: Clarene Essex, MD;  Location: [facility];  Service: Endoscopy;;  . BIOPSY  03/02/2019   Procedure: BIOPSY;  Surgeon: Clarene Essex, MD;  Location: [facility];  Service: Endoscopy;;  . CARDIAC CATHETERIZATION  04-30-2010   Left main coronary artery is normal.   .  CARDIOVASCULAR STRESS TEST  03-19-2010   0%  . CHOLECYSTECTOMY  1990  . COLONOSCOPY N/A 03/02/2019   Procedure: COLONOSCOPY;  Surgeon: Clarene Essex, MD;  [facility];  Service: Endoscopy;  Laterality: N/A;  . COLONOSCOPY W/ POLYPECTOMY    . ESOPHAGOGASTRODUODENOSCOPY (EGD) WITH PROPOFOL N/A 02/09/2019   Procedure: ESOPHAGOGASTRODUODENOSCOPY (EGD) WITH PROPOFOL;  Surgeon: Clarene Essex, MD;  [facility];  Service: Endoscopy;  Laterality: N/A;  . EYE SURGERY Right    catarct  . poly removed from nose as a child    . PVC ablation  05/29/2010  . RADIOLOGY WITH ANESTHESIA N/A 11/07/2020   Procedure: MRV HEAD WITH AND WITHOUT;  Surgeon: Radiologist, Medication, MD;  Location: [facility];  Service: Radiology;  Laterality: N/A;  . RADIOLOGY WITH ANESTHESIA N/A 11/07/2020   Procedure: CT LUMBAR SPINE WITHOUT;  Surgeon: Radiologist, Medication, MD;  Location: [facility];  Service: Radiology;  Laterality: N/A;  . US ECHOCARDIOGRAPHY  03-09-2010   EF 55-60%    There were no vitals filed for this visit.   Subjective Assessment - 01/10/21 1537    Subjective "Do I see you first today?"    Currently in Pain? No/denies                 ADULT SLP TREATMENT -  01/10/21 1538      General Information   Behavior/Cognition Alert;Cooperative;Pleasant mood      Treatment Provided   Treatment provided Cognitive-Linquistic      Cognitive-Linquistic Treatment   Treatment focused on [place] says he's done his PT exercises BID for past two days. "I just remember the other time." He cont to show errors on his homework (got 4/5 correct) which he has double-checked. With mod-max cues he was able to correct his error. SLP asked pt what he was not doing that he wanted to do again and pt stated he would like ot pay bills. SLP provided pt practice with this today, req'ing extra time consistently ; pt did not double check ihs work until asked to do so by SLP. Pt forgot to fill out  checks and deposit slips and SLP provided total A for pt to recall this portion of the work provided. He will finish this task at home and bring it in next time.      Assessment / Recommendations / Plan   Plan Continue with current plan of care      Progression Toward Goals   Progression toward goals Not progressing toward goals (comment)              SLP Short Term Goals - 01/02/21 1704      SLP SHORT TERM GOAL #1   Title pt will perform functional reading and/or written tasks for 7 minutes in 2 sessions    Baseline 12-19-20    Status Achieved      SLP SHORT TERM GOAL #2   Title pt will process verbal or written directions with 80% accuracy with occasional min A x3 vistis    Baseline 12-05-20, 12-12-20    Status Achieved      SLP SHORT TERM GOAL #3   Title pt will verbally state 3 strategies to improve memory skills in 3 sessions    Baseline 12-12-20 (phone alarm), 12-19-20 (visual cue - blue folder)    Status Partially Met      SLP SHORT TERM GOAL #4   Title pt will demo understanding of his non-physical deficits folloiwng brain bleed with modified independence in 3 sessions    Baseline 12-12-20, 12-21-20    Status Partially Met            SLP Long Term Goals - 01/10/21 1637      SLP LONG TERM GOAL #1   Title Pt will use memory compensation system for daily tasks when appropriate, x1 in 4 sessions    Baseline 12-19-20 (PT HEP), 01-08-21 (HEP), 01-10-21 (HEP with phone alarm)    Time 3    Period Weeks   or 17 visits, for all LTGs   Status On-going      SLP LONG TERM GOAL #2   Title pt will demonstrate error awareness in linguistic tasks 90% of the time with nonverbal cues in 3 sessions    Time 3    Period Weeks    Status On-going      SLP LONG TERM GOAL #3   Title pt and/or wife will report pt has completed tasks demonstrating ability to solve problems in the home environment x3 sessions    Baseline 01-10-21 (faucet -needs wrench)    Time 3    Period Weeks    Status  On-going      SLP LONG TERM GOAL #4   Title pt will show knowledge of compensating for  cognitive deficits in linguistic tasks    Time 3    Period Weeks    Status On-going            Plan - 01/10/21 1637    Clinical Impression Statement [name] presents today with cognitive communication deficits in areas of attention, problem solving, processing, awareness, and memory. SLP continues to see [name] struggle with simple-mod complex tasks or with simple novel tasks. Pt severity of deficit is becoming more of an issue in progression of cognitive goals. [name] will be seen until the end of May 2022 and then consideration will be made to d/c at highest current rehab level, or to renew for more visits if SLP believes this will benefit pt.    Speech Therapy Frequency 2x / week    Duration 8 weeks   or 17 total sessions   Treatment/Interventions Language facilitation;Compensatory techniques;Internal/external aids;SLP instruction and feedback;Patient/family education;Environmental controls;Cognitive reorganization    Potential to Achieve Goals Good    Consulted and Agree with Plan of Care Patient;Family member/caregiver           Patient will benefit from skilled therapeutic intervention in order to improve the following deficits and impairments:   Cognitive communication deficit    Problem List Patient Active Problem List   Diagnosis Date Noted  . AAA (abdominal aortic aneurysm) without rupture ([place]) 11/14/2020  . Subdural hematoma ([place]) 08/15/2020  . Iron deficiency anemia due to chronic blood loss 01/06/2019  . Orthostatic hypotension 01/05/2019    Class: Question of  . Normocytic normochromic anemia 01/05/2019  . Near syncope 01/05/2019  . Chronic respiratory failure with hypoxia ([place]) 06/30/2018  . OSA (obstructive sleep apnea) 02/11/2018  . Physical deconditioning 08/11/2017  . Shortness of breath 10/24/2014  . COPD (chronic obstructive pulmonary disease) with emphysema  ([place]) 09/01/2014  . Fatigue 10/14/2011  . Atrial fibrillation ([place]) 05/15/2011  . Premature ventricular contractions (PVCs) ([facility]) 02/25/2011  . 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  . PERSISTENT DISORDER INITIATING/MAINTAINING SLEEP 05/10/2010  . HYPERCHOLESTEROLEMIA 05/09/2010  . GOUT 05/09/2010  . OBESITY 05/09/2010  . Obstructive sleep apnea 05/09/2010  . Essential hypertension 05/09/2010  . VENTRICULAR TACHYCARDIA 05/09/2010    [facility], CCC-SLP  01/10/2021, 4:39 PM  [facility] [address] [place], [place], [address] Phone: [phone]   Fax:  [phone]   Name: [name] MRN: [id number] Date of Birth: 1933/12/10

## 2021-01-10 NOTE — Therapy (Signed)
[facility] [address] [place], [place], [address] Phone: [phone]   Fax:  [phone]  Occupational Therapy Treatment  Patient Details  Name: [name] MRN: [id number] Date of Birth: November 09, 1933 Referring Provider (OT): Dr. Brett Fairy (referred by hospitalist with send to neurology)   Encounter Date: 01/10/2021   OT End of Session - 01/10/21 1454    Visit Number 16    Number of Visits 25    Date for OT Re-Evaluation 01/24/21    Authorization Type [employer] Medicare    Authorization Time Period 90 days, anticipate d/c after 8 weeks .    Authorization - Visit Number 16    Authorization - Number of Visits 20    OT Start Time 1450    OT Stop Time 1530    OT Time Calculation (min) 40 min           Past Medical History:  Diagnosis Date  . Afib ([place])   . Arthritis   . BPH (benign prostatic hyperplasia)   . COPD (chronic obstructive pulmonary disease) ([place])   . Coronary artery disease   . Coronary artery ectasia    Mild CAD with normal systolic function per cath in August of 2011  . Depression   . Diaphragmatic paralysis    felt to be partially responsible for dyspnea  . Dyspnea    due to paralyzed diaphragm and pulmonary issues  . Dysrhythmia   . GERD (gastroesophageal reflux disease)   . Gout   . HTN (hypertension)   . Hypercholesteremia   . Hypertension   . Iron deficiency anemia   . Microhematuria   . Obesity   . OSA (obstructive sleep apnea)    CPAP  . Paroxysmal atrial fibrillation ([facility])    occured in the setting of acute E Coli sepsis and ileius 7/12  . PVC (premature ventricular contraction)    s/p PVC ablation 05/29/2010    Past Surgical History:  Procedure Laterality Date  . APPENDECTOMY  1978  . BIOPSY  02/09/2019   Procedure: BIOPSY;  Surgeon: Clarene Essex, MD;  [facility];  Service: Endoscopy;;  . BIOPSY  03/02/2019   Procedure: BIOPSY;  Surgeon: Clarene Essex, MD;  [facility];  Service: Endoscopy;;  . CARDIAC CATHETERIZATION  04-30-2010   Left main coronary artery is normal.   . CARDIOVASCULAR STRESS TEST  03-19-2010   0%  . CHOLECYSTECTOMY  1990  . COLONOSCOPY N/A 03/02/2019   Procedure: COLONOSCOPY;  Surgeon: Clarene Essex, MD;  [facility];  Service: Endoscopy;  Laterality: N/A;  . COLONOSCOPY W/ POLYPECTOMY    . ESOPHAGOGASTRODUODENOSCOPY (EGD) WITH PROPOFOL N/A 02/09/2019   Procedure: ESOPHAGOGASTRODUODENOSCOPY (EGD) WITH PROPOFOL;  Surgeon: Clarene Essex, MD;  [facility];  Service: Endoscopy;  Laterality: N/A;  . EYE SURGERY Right    catarct  . poly removed from nose as a child    . PVC ablation  05/29/2010  . RADIOLOGY WITH ANESTHESIA N/A 11/07/2020   Procedure: MRV HEAD WITH AND WITHOUT;  Surgeon: Radiologist, Medication, MD;  Location: [facility];  Service: Radiology;  Laterality: N/A;  . RADIOLOGY WITH ANESTHESIA N/A 11/07/2020   Procedure: CT LUMBAR SPINE WITHOUT;  Surgeon: Radiologist, Medication, MD;  Location: [facility];  Service: Radiology;  Laterality: N/A;  . US ECHOCARDIOGRAPHY  03-09-2010   EF 55-60%    There were no vitals filed for this visit.   Subjective Assessment - 01/10/21 1454    Subjective  Denies pain  Pertinent History Pt had a fall while shopping in [employer] on 08/15/20. Pt sustained a parafalcine and tentorial SDH. Was discharged from the hospital on 08/17/20.PMH:HTN, CAD, COPD, A-fib                  Treatment :Constant therapy alphabitizing alternating upper and lower case, level 1 for alternating attention 92% accuracy, 108 secs time               OT Education - 01/10/21 1539    Education Details seated strengthening HEP, 2 sets of 10 reps each, min v.c - see pt instructions, min v.c and demonstration.   Person(s) Educated Patient    Methods Explanation;Demonstration;Verbal cues;Handout    Comprehension Verbalized understanding;Returned demonstration;Verbal cues required            OT  Short Term Goals - 12/14/20 1620      OT SHORT TERM GOAL #1   Title Pt/ caregeiver will be I with HEP. 11/29/20    Time 4    Period Weeks    Status Achieved   met, for coordination HEP   [place] Date 11/29/20      OT SHORT TERM GOAL #2   Title Pt / wife will verbalize understanding of strategies to increase safety and I with ADLS.    Time 4    Period Weeks    Status Achieved      OT SHORT TERM GOAL #3   Title Pt will perfrom transitional movments for ADLS with close supervision and no LOB.    Baseline histroy of falls    Time 4    Period Weeks    Status Achieved      OT SHORT TERM GOAL #4   Title Pt will demonstrate improved fine motor coordination in his LUE as eveidenced by decreasing 9-hole peg test score by 4 secs. UPDATE: complete in 35 seconds or less with LUE    Baseline 45.72 LUE at eval    Time 4    Period Weeks    Status Revised   39.46s with LUE 12/14/2020     OT SHORT TERM GOAL #5   Title Pt will sequence a simple functional or ADL task  with 90% or better accuracy .    Time 4    Period Weeks    Status Achieved   93% on constant therapy     OT SHORT TERM GOAL #6   Title Pt will demonstrate improved fine motor coordination for ADLs as evidenced by decreasing 3 button/ unbutton time to 90 secs or less.    Time 4    Period Weeks    Status Achieved   49.25            OT Long Term Goals - 01/08/21 1541      OT LONG TERM GOAL #1   Title Pt/ wife will be I with updated HEP for proximal strength.    Time 12    Period Weeks    Status On-going      OT LONG TERM GOAL #2   Title Pt will demonstrate ability to perform simple beverage / snack prep modifeied indpendent demonstraing good saftey awareness.    Time 12    Period Weeks    Status Achieved   pt reports doing this at home     OT LONG TERM GOAL #3   Title Pt will perforrm bathing and  dressing mod I in a reasonable amout of time    Baseline increased  time required    Time 12    Period Weeks    Status  Achieved   per pt report     OT LONG TERM GOAL #4   Title Pt will demonstrate adequate bilateral UE strength to retrieve a lightweight object (3 lbs) from eye level  shelf without dropping with right and left UE's individually.    Time 12    Period Weeks    Status Achieved      OT LONG TERM GOAL #5   Title Pt will increase standing functional reach test score to 10 inches or greater with RUE to minimize fall risk during ADLs.    Time 12    Period Weeks    Status On-going   6"     OT LONG TERM GOAL #6   Title Pt will perform physical and cognitive task with 90% accuracy    Time 12    Period Weeks    Status On-going   80% accuracy 01/08/21                Plan - 01/10/21 1528    Clinical Impression Statement Pt progressing towards goals.He demonstrates understanding of strengthening HEP.    OT Occupational Profile and History Detailed Assessment- Review of Records and additional review of physical, cognitive, psychosocial history related to current functional performance    Occupational performance deficits (Please refer to evaluation for details): ADL's;IADL's;Leisure;Social Participation    Body Structure / Function / Physical Skills ADL;UE functional use;Balance;FMC;GMC;Coordination;Decreased knowledge of use of DME;IADL;Dexterity;Strength;Mobility    Cognitive Skills Memory;Safety Awareness;Problem Solve;Sequencing;Thought;Understand;Attention    OT Frequency 2x / week    OT Duration 12 weeks   12 weeks total. renewal completed at week 5/visit 10 to address goals.   OT Treatment/Interventions Self-care/ADL training;Energy conservation;Patient/family education;DME and/or AE instruction;Aquatic Therapy;Paraffin;Passive range of motion;Balance training;Fluidtherapy;Cryotherapy;Therapist, nutritional;Therapeutic activities;Manual Therapy;Therapeutic exercise;Moist Heat;Neuromuscular education;Cognitive remediation/compensation    Plan review updated HEP, continue to address  cognition, dynamic balance    Consulted and Agree with Plan of Care Patient    Family Member Consulted discussed with pt's wife who was in waiting room           Patient will benefit from skilled therapeutic intervention in order to improve the following deficits and impairments:   Body Structure / Function / Physical Skills: ADL,UE functional use,Balance,FMC,GMC,Coordination,Decreased knowledge of use of DME,IADL,Dexterity,Strength,Mobility Cognitive Skills: Memory,Safety Awareness,Problem Solve,Sequencing,Thought,Understand,Attention     Visit Diagnosis: Other lack of coordination  Attention and concentration deficit  Frontal lobe and executive function deficit  Muscle weakness (generalized)  Unsteadiness on feet    Problem List Patient Active Problem List   Diagnosis Date Noted  . AAA (abdominal aortic aneurysm) without rupture ([place]) 11/14/2020  . Subdural hematoma ([place]) 08/15/2020  . Iron deficiency anemia due to chronic blood loss 01/06/2019  . Orthostatic hypotension 01/05/2019    Class: Question of  . Normocytic normochromic anemia 01/05/2019  . Near syncope 01/05/2019  . Chronic respiratory failure with hypoxia ([place]) 06/30/2018  . OSA (obstructive sleep apnea) 02/11/2018  . Physical deconditioning 08/11/2017  . Shortness of breath 10/24/2014  . COPD (chronic obstructive pulmonary disease) with emphysema ([place]) 09/01/2014  . Fatigue 10/14/2011  . Atrial fibrillation ([place]) 05/15/2011  . Premature ventricular contractions (PVCs) ([facility]) 02/25/2011  . 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  . PERSISTENT DISORDER INITIATING/MAINTAINING SLEEP 05/10/2010  . HYPERCHOLESTEROLEMIA 05/09/2010  . GOUT 05/09/2010  . OBESITY 05/09/2010  . Obstructive sleep apnea 05/09/2010  . Essential hypertension 05/09/2010  . VENTRICULAR  TACHYCARDIA 05/09/2010    Natania Finigan 01/10/2021, 3:40 PM  [facility] [address] [place], [place], [address] Phone: [phone]   Fax:  [phone]  Name: [name] MRN: [id number] Date of Birth: 20-Aug-1934

## 2021-01-10 NOTE — Patient Instructions (Signed)
STOP exercises if you have pain     SHOULDER: Flexion Unilateral (Weight)    Start with arm at side. Raise arm forward and up only to shoulder height. Keep elbow straight. Hold _3__ seconds. Use __2_ lb weight. 10___ reps per set, _2__ sets per day, __5_ days per week Use __2_ lb dumbbell.  Copyright  VHI. All rights reserved.  ELBOW: Flexion (Weight)    Start with elbow straight, palm facing forward. Bend elbow. Hold _3__ seconds. Use __2_ lb weight. _10__ reps per set, _2__ sets per day, _5__ days per week Use _2__ lb dumbbell.  Copyright  VHI. All rights reserved.   SHOULDER: Extension (Band)    Start with arm slightly forward. Holding band, pull backward, past hip, keeping elbow straight. Do not swing arm. Hold __3_ seconds. Use ___yellow band_____ band. __10_ reps per set, __1_ sets per day, _5__ days per week repeat with elbows bent 10 reps 1x day  Copyright  VHI. All rights reserved.

## 2021-01-10 NOTE — Patient Instructions (Signed)
  Please complete the assigned speech therapy homework prior to your next session and return it to the speech therapist at your next visit.  

## 2021-01-16 ENCOUNTER — Encounter: Payer: Self-pay | Admitting: Occupational Therapy

## 2021-01-16 ENCOUNTER — Ambulatory Visit: Payer: Medicare Other

## 2021-01-16 ENCOUNTER — Other Ambulatory Visit: Payer: Self-pay

## 2021-01-16 ENCOUNTER — Ambulatory Visit: Payer: Medicare Other | Admitting: Occupational Therapy

## 2021-01-16 DIAGNOSIS — M6281 Muscle weakness (generalized): Secondary | ICD-10-CM

## 2021-01-16 DIAGNOSIS — R278 Other lack of coordination: Secondary | ICD-10-CM

## 2021-01-16 DIAGNOSIS — R41841 Cognitive communication deficit: Secondary | ICD-10-CM

## 2021-01-16 DIAGNOSIS — R2681 Unsteadiness on feet: Secondary | ICD-10-CM

## 2021-01-16 DIAGNOSIS — R41844 Frontal lobe and executive function deficit: Secondary | ICD-10-CM

## 2021-01-16 DIAGNOSIS — Z9181 History of falling: Secondary | ICD-10-CM

## 2021-01-16 DIAGNOSIS — R41840 Attention and concentration deficit: Secondary | ICD-10-CM

## 2021-01-16 DIAGNOSIS — R2689 Other abnormalities of gait and mobility: Secondary | ICD-10-CM

## 2021-01-16 NOTE — Patient Instructions (Signed)
  Please complete the assigned speech therapy homework prior to your next session and return it to the speech therapist at your next visit.  

## 2021-01-16 NOTE — Therapy (Signed)
[facility] [address] [place], [place], [address] Phone: [phone]   Fax:  [phone]  Speech Language Pathology Treatment  Patient Details  Name: [name] MRN: [id number] Date of Birth: February 18, 1934 Referring Provider (SLP): Margie Billet, PA-C; Dr. [name] PCP (doc)   Encounter Date: 01/16/2021   End of Session - 01/16/21 1640    Visit Number 13    Number of Visits 17    Date for SLP Re-Evaluation 01/29/21    SLP Start Time 1533    SLP Stop Time  1615    SLP Time Calculation (min) 42 min    Activity Tolerance Patient tolerated treatment well           Past Medical History:  Diagnosis Date  . Afib ([place])   . Arthritis   . BPH (benign prostatic hyperplasia)   . COPD (chronic obstructive pulmonary disease) ([place])   . Coronary artery disease   . Coronary artery ectasia    Mild CAD with normal systolic function per cath in August of 2011  . Depression   . Diaphragmatic paralysis    felt to be partially responsible for dyspnea  . Dyspnea    due to paralyzed diaphragm and pulmonary issues  . Dysrhythmia   . GERD (gastroesophageal reflux disease)   . Gout   . HTN (hypertension)   . Hypercholesteremia   . Hypertension   . Iron deficiency anemia   . Microhematuria   . Obesity   . OSA (obstructive sleep apnea)    CPAP  . Paroxysmal atrial fibrillation (HCC)    occured in the setting of acute E Coli sepsis and ileius 7/12  . PVC (premature ventricular contraction)    s/p PVC ablation 05/29/2010    Past Surgical History:  Procedure Laterality Date  . APPENDECTOMY  1978  . BIOPSY  02/09/2019   Procedure: BIOPSY;  Surgeon: Clarene Essex, MD;  Location: [facility];  Service: Endoscopy;;  . BIOPSY  03/02/2019   Procedure: BIOPSY;  Surgeon: Clarene Essex, MD;  Location: [facility];  Service: Endoscopy;;  . CARDIAC CATHETERIZATION  04-30-2010   Left main coronary artery is normal.   .  CARDIOVASCULAR STRESS TEST  03-19-2010   0%  . CHOLECYSTECTOMY  1990  . COLONOSCOPY N/A 03/02/2019   Procedure: COLONOSCOPY;  Surgeon: Clarene Essex, MD;  [facility];  Service: Endoscopy;  Laterality: N/A;  . COLONOSCOPY W/ POLYPECTOMY    . ESOPHAGOGASTRODUODENOSCOPY (EGD) WITH PROPOFOL N/A 02/09/2019   Procedure: ESOPHAGOGASTRODUODENOSCOPY (EGD) WITH PROPOFOL;  Surgeon: Clarene Essex, MD;  [facility];  Service: Endoscopy;  Laterality: N/A;  . EYE SURGERY Right    catarct  . poly removed from nose as a child    . PVC ablation  05/29/2010  . RADIOLOGY WITH ANESTHESIA N/A 11/07/2020   Procedure: MRV HEAD WITH AND WITHOUT;  Surgeon: Radiologist, Medication, MD;  Location: [facility];  Service: Radiology;  Laterality: N/A;  . RADIOLOGY WITH ANESTHESIA N/A 11/07/2020   Procedure: CT LUMBAR SPINE WITHOUT;  Surgeon: Radiologist, Medication, MD;  Location: [facility];  Service: Radiology;  Laterality: N/A;  . US ECHOCARDIOGRAPHY  03-09-2010   EF 55-60%    There were no vitals filed for this visit.   Subjective Assessment - 01/16/21 1545    Subjective Pt provided homework upon entry to [facility] room.    Currently in Pain? No/denies                 ADULT  SLP TREATMENT - 01/16/21 1546      General Information   Behavior/Cognition Alert;Cooperative;Pleasant mood      Treatment Provided   Treatment provided Cognitive-Linquistic      Cognitive-Linquistic Treatment   Treatment focused on Cognition    Skilled Treatment Homework completed with correct balance however pt incorrectly wrote the deposit slip to have all money taken out as "less cash received" AND also wrote for the entire amount to be depostied. Pt stated he has used a deposit slip in the last 4 months. SLP assisted pt with another checkwriting/billpay task and he req'd mod A occasionally for attention to detail with dates and with check numbers. SLP provided pt this task for homework and helped [name] realize it might be easier for  him to cross off items after he has put them into the register to decr errors with alternating attention.      Assessment / Recommendations / Plan   Plan Continue with current plan of care   d/c next 3-4 sessions due to plateau     Progression Toward Goals   Progression toward goals Not progressing toward goals (comment)   severity             SLP Short Term Goals - 01/02/21 1704      SLP SHORT TERM GOAL #1   Title pt will perform functional reading and/or written tasks for 7 minutes in 2 sessions    Baseline 12-19-20    Status Achieved      SLP SHORT TERM GOAL #2   Title pt will process verbal or written directions with 80% accuracy with occasional min A x3 vistis    Baseline 12-05-20, 12-12-20    Status Achieved      SLP SHORT TERM GOAL #3   Title pt will verbally state 3 strategies to improve memory skills in 3 sessions    Baseline 12-12-20 (phone alarm), 12-19-20 (visual cue - blue folder)    Status Partially Met      SLP SHORT TERM GOAL #4   Title pt will demo understanding of his non-physical deficits folloiwng brain bleed with modified independence in 3 sessions    Baseline 12-12-20, 12-21-20    Status Partially Met            SLP Long Term Goals - 01/16/21 1641      SLP LONG TERM GOAL #1   Title Pt will use memory compensation system for daily tasks when appropriate, x1 in 4 sessions    Baseline 12-19-20 (PT HEP), 01-08-21 (HEP), 01-10-21 (HEP with phone alarm), 01-16-21 (phone alarm for HEP)    Period --   or 17 visits, for all LTGs   Status Achieved      SLP LONG TERM GOAL #2   Title pt will demonstrate error awareness in linguistic tasks 90% of the time with nonverbal cues in 3 sessions    Time 2    Period Weeks    Status On-going      SLP LONG TERM GOAL #3   Title pt and/or wife will report pt has completed tasks demonstrating ability to solve problems in the home environment x3 sessions    Baseline 01-10-21 (faucet -needs wrench)    Time 2    Period Weeks     Status On-going      SLP LONG TERM GOAL #4   Title pt will show knowledge of compensating for cognitive deficits in linguistic tasks    Time 2  Period Weeks    Status On-going            Plan - 01/16/21 1640    Clinical Impression Statement [name] presents today with cognitive communication deficits in areas of attention, problem solving, processing, awareness, and memory. SLP continues to see [name] struggle with simple-mod complex tasks or with simple novel tasks. Pt severity of deficit continues to make progression of cognitive goals difficult. [name] will be seen until the end of May 2022 and then consideration will be made to d/c at highest current rehab level, or to renew for more visits if SLP believes this will benefit pt.    Speech Therapy Frequency 2x / week    Duration 8 weeks   or 17 total sessions   Treatment/Interventions Language facilitation;Compensatory techniques;Internal/external aids;SLP instruction and feedback;Patient/family education;Environmental controls;Cognitive reorganization    Potential to Achieve Goals Good    Consulted and Agree with Plan of Care Patient;Family member/caregiver           Patient will benefit from skilled therapeutic intervention in order to improve the following deficits and impairments:   Cognitive communication deficit    Problem List Patient Active Problem List   Diagnosis Date Noted  . AAA (abdominal aortic aneurysm) without rupture ([place]) 11/14/2020  . Subdural hematoma ([place]) 08/15/2020  . Iron deficiency anemia due to chronic blood loss 01/06/2019  . Orthostatic hypotension 01/05/2019    Class: Question of  . Normocytic normochromic anemia 01/05/2019  . Near syncope 01/05/2019  . Chronic respiratory failure with hypoxia ([place]) 06/30/2018  . OSA (obstructive sleep apnea) 02/11/2018  . Physical deconditioning 08/11/2017  . Shortness of breath 10/24/2014  . COPD (chronic obstructive pulmonary disease) with  emphysema ([place]) 09/01/2014  . Fatigue 10/14/2011  . Atrial fibrillation ([place]) 05/15/2011  . Premature ventricular contractions (PVCs) (VPCs) 02/25/2011  . 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  . PERSISTENT DISORDER INITIATING/MAINTAINING SLEEP 05/10/2010  . HYPERCHOLESTEROLEMIA 05/09/2010  . GOUT 05/09/2010  . OBESITY 05/09/2010  . Obstructive sleep apnea 05/09/2010  . Essential hypertension 05/09/2010  . VENTRICULAR TACHYCARDIA 05/09/2010    [facility], CCC-SLP  01/16/2021, 4:42 PM  [facility] [address] [place], [place], [address] Phone: [phone]   Fax:  [phone]   Name: [name] MRN: [id number] Date of Birth: 1933/10/04

## 2021-01-16 NOTE — Therapy (Signed)
[facility] [address] [place], [place], [address] Phone: [phone]   Fax:  [phone]  Occupational Therapy Treatment  Patient Details  Name: [name] MRN: [id number] Date of Birth: Nov 06, 1933 Referring Provider (OT): Dr. Brett Fairy (referred by hospitalist with send to neurology)   Encounter Date: 01/16/2021   OT End of Session - 01/16/21 1446    Visit Number 17    Number of Visits 25    Date for OT Re-Evaluation 01/24/21    Authorization Type [employer] Medicare    Authorization Time Period 90 days, anticipate d/c after 8 weeks .    Authorization - Visit Number 17    Authorization - Number of Visits 20    OT Start Time 6433    OT Stop Time 1530    OT Time Calculation (min) 45 min    Activity Tolerance Patient tolerated treatment well    Behavior During Therapy WFL for tasks assessed/performed           Past Medical History:  Diagnosis Date  . Afib ([place])   . Arthritis   . BPH (benign prostatic hyperplasia)   . COPD (chronic obstructive pulmonary disease) ([place])   . Coronary artery disease   . Coronary artery ectasia    Mild CAD with normal systolic function per cath in August of 2011  . Depression   . Diaphragmatic paralysis    felt to be partially responsible for dyspnea  . Dyspnea    due to paralyzed diaphragm and pulmonary issues  . Dysrhythmia   . GERD (gastroesophageal reflux disease)   . Gout   . HTN (hypertension)   . Hypercholesteremia   . Hypertension   . Iron deficiency anemia   . Microhematuria   . Obesity   . OSA (obstructive sleep apnea)    CPAP  . Paroxysmal atrial fibrillation (H[facility])    occured in the setting of acute E Coli sepsis and ileius 7/12  . PVC (premature ventricular contraction)    s/p PVC ablation 05/29/2010    Past Surgical History:  Procedure Laterality Date  . APPENDECTOMY  1978  . BIOPSY  02/09/2019   Procedure: BIOPSY;  Surgeon: Clarene Essex, MD;  [facility];  Service: Endoscopy;;  . BIOPSY  03/02/2019   Procedure: BIOPSY;  Surgeon: Clarene Essex, MD;  [facility];  Service: Endoscopy;;  . CARDIAC CATHETERIZATION  04-30-2010   Left main coronary artery is normal.   . CARDIOVASCULAR STRESS TEST  03-19-2010   0%  . CHOLECYSTECTOMY  1990  . COLONOSCOPY N/A 03/02/2019   Procedure: COLONOSCOPY;  Surgeon: Clarene Essex, MD;  [facility];  Service: Endoscopy;  Laterality: N/A;  . COLONOSCOPY W/ POLYPECTOMY    . ESOPHAGOGASTRODUODENOSCOPY (EGD) WITH PROPOFOL N/A 02/09/2019   Procedure: ESOPHAGOGASTRODUODENOSCOPY (EGD) WITH PROPOFOL;  Surgeon: Clarene Essex, MD;  [facility];  Service: Endoscopy;  Laterality: N/A;  . EYE SURGERY Right    catarct  . poly removed from nose as a child    . PVC ablation  05/29/2010  . RADIOLOGY WITH ANESTHESIA N/A 11/07/2020   Procedure: MRV HEAD WITH AND WITHOUT;  Surgeon: Radiologist, Medication, MD;  Location: [facility];  Service: Radiology;  Laterality: N/A;  . RADIOLOGY WITH ANESTHESIA N/A 11/07/2020   Procedure: CT LUMBAR SPINE WITHOUT;  Surgeon: Radiologist, Medication, MD;  Location: [facility];  Service: Radiology;  Laterality: N/A;  . US ECHOCARDIOGRAPHY  03-09-2010   EF 55-60%    There were no  vitals filed for this visit.   Subjective Assessment - 01/16/21 1446    Subjective  Pt denies any pain or changes.    Pertinent History Pt had a fall while shopping in [employer] on 08/15/20. Pt sustained a parafalcine and tentorial SDH. Was discharged from the hospital on 08/17/20.PMH:HTN, CAD, COPD, A-fib    Currently in Pain? No/denies            Pt reports things are a challenge but he feels therapy is strengthening him a little bit.   Small Pegboard with LUE with copying pattern. Pt completed with min difficulty with coordination with LUE and with min difficulty with copying pattern. Pt req'd increased time but was able to identify errors and correct them with only 1 cue.  Constant Therapy  Alphabetizing Alternating Words (uppercase and lowercase) level 1 with 91% accuracy and 92.54s response time.                   OT Short Term Goals - 12/14/20 1620      OT SHORT TERM GOAL #1   Title Pt/ caregeiver will be I with HEP. 11/29/20    Time 4    Period Weeks    Status Achieved   met, for coordination HEP   [place] Date 11/29/20      OT SHORT TERM GOAL #2   Title Pt / wife will verbalize understanding of strategies to increase safety and I with ADLS.    Time 4    Period Weeks    Status Achieved      OT SHORT TERM GOAL #3   Title Pt will perfrom transitional movments for ADLS with close supervision and no LOB.    Baseline histroy of falls    Time 4    Period Weeks    Status Achieved      OT SHORT TERM GOAL #4   Title Pt will demonstrate improved fine motor coordination in his LUE as eveidenced by decreasing 9-hole peg test score by 4 secs. UPDATE: complete in 35 seconds or less with LUE    Baseline 45.72 LUE at eval    Time 4    Period Weeks    Status Revised   39.46s with LUE 12/14/2020     OT SHORT TERM GOAL #5   Title Pt will sequence a simple functional or ADL task  with 90% or better accuracy .    Time 4    Period Weeks    Status Achieved   93% on constant therapy     OT SHORT TERM GOAL #6   Title Pt will demonstrate improved fine motor coordination for ADLs as evidenced by decreasing 3 button/ unbutton time to 90 secs or less.    Time 4    Period Weeks    Status Achieved   49.25            OT Long Term Goals - 01/08/21 1541      OT LONG TERM GOAL #1   Title Pt/ wife will be I with updated HEP for proximal strength.    Time 12    Period Weeks    Status On-going      OT LONG TERM GOAL #2   Title Pt will demonstrate ability to perform simple beverage / snack prep modifeied indpendent demonstraing good saftey awareness.    Time 12    Period Weeks    Status Achieved   pt reports doing this at home  OT LONG TERM GOAL #3   Title Pt  will perforrm bathing and  dressing mod I in a reasonable amout of time    Baseline increased time required    Time 12    Period Weeks    Status Achieved   per pt report     OT LONG TERM GOAL #4   Title Pt will demonstrate adequate bilateral UE strength to retrieve a lightweight object (3 lbs) from eye level  shelf without dropping with right and left UE's individually.    Time 12    Period Weeks    Status Achieved      OT LONG TERM GOAL #5   Title Pt will increase standing functional reach test score to 10 inches or greater with RUE to minimize fall risk during ADLs.    Time 12    Period Weeks    Status On-going   6"     OT LONG TERM GOAL #6   Title Pt will perform physical and cognitive task with 90% accuracy    Time 12    Period Weeks    Status On-going   80% accuracy 01/08/21                Plan - 01/16/21 1503    Clinical Impression Statement Pt appeared to have increased tremors this day with LUE.    OT Occupational Profile and History Detailed Assessment- Review of Records and additional review of physical, cognitive, psychosocial history related to current functional performance    Occupational performance deficits (Please refer to evaluation for details): ADL's;IADL's;Leisure;Social Participation    Body Structure / Function / Physical Skills ADL;UE functional use;Balance;FMC;GMC;Coordination;Decreased knowledge of use of DME;IADL;Dexterity;Strength;Mobility    Cognitive Skills Memory;Safety Awareness;Problem Solve;Sequencing;Thought;Understand;Attention    OT Frequency 2x / week    OT Duration 12 weeks   12 weeks total. renewal completed at week 5/visit 10 to address goals.   OT Treatment/Interventions Self-care/ADL training;Energy conservation;Patient/family education;DME and/or AE instruction;Aquatic Therapy;Paraffin;Passive range of motion;Balance training;Fluidtherapy;Cryotherapy;Therapist, nutritional;Therapeutic activities;Manual Therapy;Therapeutic  exercise;Moist Heat;Neuromuscular education;Cognitive remediation/compensation    Plan review updated HEP, continue to address cognition, dynamic balance    Consulted and Agree with Plan of Care Patient    Family Member Consulted discussed with pt's wife who was in waiting room           Patient will benefit from skilled therapeutic intervention in order to improve the following deficits and impairments:   Body Structure / Function / Physical Skills: ADL,UE functional use,Balance,FMC,GMC,Coordination,Decreased knowledge of use of DME,IADL,Dexterity,Strength,Mobility Cognitive Skills: Memory,Safety Awareness,Problem Solve,Sequencing,Thought,Understand,Attention     Visit Diagnosis: Other lack of coordination  Attention and concentration deficit  Frontal lobe and executive function deficit  Muscle weakness (generalized)  Unsteadiness on feet    Problem List Patient Active Problem List   Diagnosis Date Noted  . AAA (abdominal aortic aneurysm) without rupture ([place]) 11/14/2020  . Subdural hematoma ([place]) 08/15/2020  . Iron deficiency anemia due to chronic blood loss 01/06/2019  . Orthostatic hypotension 01/05/2019    Class: Question of  . Normocytic normochromic anemia 01/05/2019  . Near syncope 01/05/2019  . Chronic respiratory failure with hypoxia ([place]) 06/30/2018  . OSA (obstructive sleep apnea) 02/11/2018  . Physical deconditioning 08/11/2017  . Shortness of breath 10/24/2014  . COPD (chronic obstructive pulmonary disease) with emphysema ([place]) 09/01/2014  . Fatigue 10/14/2011  . Atrial fibrillation ([place]) 05/15/2011  . Premature ventricular contractions (PVCs) (VPCs) 02/25/2011  . 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  . PERSISTENT DISORDER  INITIATING/MAINTAINING SLEEP 05/10/2010  . HYPERCHOLESTEROLEMIA 05/09/2010  . GOUT 05/09/2010  . OBESITY 05/09/2010  . Obstructive sleep apnea 05/09/2010  . Essential hypertension 05/09/2010  . VENTRICULAR TACHYCARDIA 05/09/2010     Zachery Conch MOT, OTR/L  01/16/2021, 4:50 PM  [facility] [address] [place], [place], [address] Phone: [phone]   Fax:  [phone]  Name: [name] MRN: [id number] Date of Birth: July 24, 1934

## 2021-01-17 NOTE — Therapy (Signed)
[facility] [address] [place], [place], [address] Phone: [phone]   Fax:  [phone]  Physical Therapy Treatment  Patient Details  Name: [name] MRN: [id number] Date of Birth: [birth year] Referring Provider (PT): Dohmeier, Asencion Partridge, MD   Encounter Date: 01/16/2021   PT End of Session - 01/16/21 1700    Visit Number 20    Number of Visits 24    Date for PT Re-Evaluation 03/03/21    Authorization Type UHC Medicare    Progress Note Due on Visit 20    PT Start Time 1615    PT Stop Time 1700    PT Time Calculation (min) 45 min    Equipment Utilized During Treatment Gait belt    Activity Tolerance Patient tolerated treatment well    Behavior During Therapy [facility] for tasks assessed/performed           Past Medical History:  Diagnosis Date  . Afib ([place])   . Arthritis   . BPH (benign prostatic hyperplasia)   . COPD (chronic obstructive pulmonary disease) ([place])   . Coronary artery disease   . Coronary artery ectasia    Mild CAD with normal systolic function per cath in August of 2011  . Depression   . Diaphragmatic paralysis    felt to be partially responsible for dyspnea  . Dyspnea    due to paralyzed diaphragm and pulmonary issues  . Dysrhythmia   . GERD (gastroesophageal reflux disease)   . Gout   . HTN (hypertension)   . Hypercholesteremia   . Hypertension   . Iron deficiency anemia   . Microhematuria   . Obesity   . OSA (obstructive sleep apnea)    CPAP  . Paroxysmal atrial fibrillation (HCC)    occured in the setting of acute E Coli sepsis and ileius 7/12  . PVC (premature ventricular contraction)    s/p PVC ablation 05/29/2010    Past Surgical History:  Procedure Laterality Date  . APPENDECTOMY  1978  . BIOPSY  02/09/2019   Procedure: BIOPSY;  Surgeon: Clarene Essex, MD;  [facility];  Service: Endoscopy;;  . BIOPSY  03/02/2019   Procedure: BIOPSY;  Surgeon: Clarene Essex, MD;   [facility];  Service: Endoscopy;;  . CARDIAC CATHETERIZATION  04-30-2010   Left main coronary artery is normal.   . CARDIOVASCULAR STRESS TEST  03-19-2010   0%  . CHOLECYSTECTOMY  1990  . COLONOSCOPY N/A 03/02/2019   Procedure: COLONOSCOPY;  Surgeon: Clarene Essex, MD;  [facility];  Service: Endoscopy;  Laterality: N/A;  . COLONOSCOPY W/ POLYPECTOMY    . ESOPHAGOGASTRODUODENOSCOPY (EGD) WITH PROPOFOL N/A 02/09/2019   Procedure: ESOPHAGOGASTRODUODENOSCOPY (EGD) WITH PROPOFOL;  Surgeon: Clarene Essex, MD;  [facility];  Service: Endoscopy;  Laterality: N/A;  . EYE SURGERY Right    catarct  . poly removed from nose as a child    . PVC ablation  05/29/2010  . RADIOLOGY WITH ANESTHESIA N/A 11/07/2020   Procedure: MRV HEAD WITH AND WITHOUT;  Surgeon: Radiologist, Medication, MD;  Location: [facility];  Service: Radiology;  Laterality: N/A;  . RADIOLOGY WITH ANESTHESIA N/A 11/07/2020   Procedure: CT LUMBAR SPINE WITHOUT;  Surgeon: Radiologist, Medication, MD;  Location: [facility];  Service: Radiology;  Laterality: N/A;  . US ECHOCARDIOGRAPHY  03-09-2010   EF 55-60%    There were no vitals filed for this visit.   Subjective Assessment - 01/16/21 1616    Subjective No falls to  report, back pain onsets with prolonged walking    Patient is accompained by: Family member   wife, [name]   Pertinent History HTN, CAD, COPD, A-fib    Limitations Standing;Walking    How long can you sit comfortably? 30'    How long can you stand comfortably? 59'    How long can you walk comfortably? 58'    Patient Stated Goals wants to improve his balance    Pain Onset Today             01/16/21 0001  Ambulation/Gait  Ambulation/Gait Yes  Ambulation/Gait Assistance 5: Supervision;4: Min guard  Ambulation Distance (Feet) 230 Feet  Assistive device Straight cane  Gait Pattern Step-through pattern;Decreased arm swing - left;Wide base of support  Gait velocity 16.94s 1.94 ft/s  Knee/Hip Exercises:  Aerobic  Other Aerobic Scifit L1 8'          01/16/21 0001  Ambulation/Gait  Ambulation/Gait Yes  Ambulation/Gait Assistance 5: Supervision;4: Min guard  Ambulation Distance (Feet) 230 Feet  Assistive device Straight cane  Gait Pattern Step-through pattern;Decreased arm swing - left;Wide base of support  Gait velocity 16.94s 1.94 ft/s  Berg Balance Test  Sit to Stand 3  Standing Unsupported 4  Sitting with Back Unsupported but Feet Supported on Floor or Stool 4  Stand to Sit 4  Transfers 4  Standing Unsupported with Eyes Closed 4  Standing Unsupported with Feet Together 3  From Standing, Reach Forward with Outstretched Arm 4  From Standing Position, Pick up Object from Floor 4  From Standing Position, Turn to Look Behind Over each Shoulder 3  Turn 360 Degrees 2  Standing Unsupported, Alternately Place Feet on Step/Stool 2  Standing Unsupported, One Foot in Front 2  Standing on One Leg 1  Total Score 44  Timed Up and Go Test  Normal TUG (seconds) 14.67  TUG Comments SPC w/quad tip                         PT Short Term Goals - 11/28/20 1455      PT SHORT TERM GOAL #1   Title Pt will initiate HEP in order to indicate improved functional mobility and decreased fall risk.  ALL STGS DUE 11/21/20    Baseline reviewed HEP on 11/28/20    Time 4    Period Weeks    Status Achieved    Target Date 11/21/20      PT SHORT TERM GOAL #2   Title Pt will perform 5x sit <> stand in 17 seconds or less with BUE support in order to demo improved functional strength.    Baseline 19.31 seconds, 10.97 seconds with BUE support on 11/28/20    Time 4    Period Weeks    Status Achieved      PT SHORT TERM GOAL #3   Title Pt will undergo BERG with STG and LTG written as appropriate.    Baseline 10/27/20 BERG 37/56    Time 4    Period Weeks    Status Achieved      PT SHORT TERM GOAL #4   Title Pt will improve gait speed with rollator to at least 2.5 ft/sec in order demo  improved community mobility.    Baseline 2.18 ft/sec, 14.41 seconds = 2.27 ft/sec on 3/22    Time 4    Period Weeks    Status Not Met      PT SHORT TERM GOAL #5  Title Pt and pt's spouse will verbalize understanding of fall prevention in the home to decr fall risk.    Time 4    Period Weeks    Status New             PT Long Term Goals - 01/16/21 1653      PT LONG TERM GOAL #1   Title Pt will be independent with final HEP in order to indicate improved functional mobility and decreased fall risk.  ALL LTGS DUE 01/30/21    Baseline updated on 01/02/21 - will benefit from on going additions    Time 4    Period Weeks    Status On-going      PT LONG TERM GOAL #2   Title Pt to improve BERG score to at least a 44/56 in order to demo decr fall risk.    Baseline 37; 41/56 on 12/19/20; 01/16/21 BERG score 44    Time 4    Period Weeks    Status On-going      PT LONG TERM GOAL #3   Title Pt will improve gait speed to at least 2.1 ft/sec in order to demo improved gait efficiency.    Baseline 1.92 ft/sec with SPC with quad tip; 01/16/21 gait velocity 1.71f/s    Time 4    Period Weeks    Status On-going      PT LONG TERM GOAL #4   Title Pt will ambulate at least 230' over indoor surfaces with cane vs. LRAD with supervision in order to demo improved household mobility.    Baseline supervision/min guard with SPC with quad tip on 12/19/20; 01/16/21 Ambulation of 2332facross level ground with SPC and SBA    Time 4    Period Weeks    Status On-going      PT LONG TERM GOAL #5   Title Pt will perform TUG in 13.5 seconds or less with SPC with quad tip in order to demo decr fall risk.    Baseline 14.5 seconds with SPC with 4 prong tip; 01/16/21 TUG score 14.67s performed with small tip SPC    Time 4    Period Weeks    Status On-going                 Plan - 01/17/21 1642    Clinical Impression Statement Todays rehab session serves as a 10th visit progress note.  Goals reassessed with  patient demoing no appreciable gain in BERG score, TUG or gait velocity.  Main limiting factor is LOB during SLS tasks, directional changes and vision removed activities.  Patient presents with diminished righting reactions.    Personal Factors and Comorbidities Comorbidity 3+;Past/Current Experience    Comorbidities HTN, CAD, COPD, A-fib, hx of COVID march 2021    Examination-Activity Limitations Stand;Squat;Transfers;Reach Overhead;Locomotion Level    Examination-Participation Restrictions Community Activity;Driving    Stability/Clinical Decision Making Evolving/Moderate complexity    Rehab Potential Good    PT Frequency 2x / week    PT Duration 8 weeks    PT Treatment/Interventions ADLs/Self Care Home Management;DME Instruction;Gait training;Functional mobility training;Stair training;Neuromuscular re-education;Balance training;Therapeutic exercise;Therapeutic activities;Patient/family education;Vestibular;Energy conservation    PT Next Visit Plan Continue to focus on balance tasks when turning, in SLS and in vision removed environment    PT [place]  Consulted and Agree with Plan of Care Patient           Patient will benefit from skilled therapeutic intervention in order  to improve the following deficits and impairments:  Abnormal gait,Decreased activity tolerance,Decreased balance,Decreased endurance,Decreased coordination,Decreased cognition,Decreased knowledge of use of DME,Decreased range of motion,Difficulty walking,Decreased safety awareness,Decreased strength  Visit Diagnosis: Other lack of coordination  Muscle weakness (generalized)  Unsteadiness on feet  Other abnormalities of gait and mobility  History of falling     Problem List Patient Active Problem List   Diagnosis Date Noted  . AAA (abdominal aortic aneurysm) without rupture ([place]) 11/14/2020  . Subdural hematoma ([place]) 08/15/2020  . Iron deficiency anemia due to chronic blood loss  01/06/2019  . Orthostatic hypotension 01/05/2019    Class: Question of  . Normocytic normochromic anemia 01/05/2019  . Near syncope 01/05/2019  . Chronic respiratory failure with hypoxia ([place]) 06/30/2018  . OSA (obstructive sleep apnea) 02/11/2018  . Physical deconditioning 08/11/2017  . Shortness of breath 10/24/2014  . COPD (chronic obstructive pulmonary disease) with emphysema ([place]) 09/01/2014  . Fatigue 10/14/2011  . Atrial fibrillation ([place]) 05/15/2011  . Premature ventricular contractions (PVCs) (VPCs) 02/25/2011  . 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  . PERSISTENT DISORDER INITIATING/MAINTAINING SLEEP 05/10/2010  . HYPERCHOLESTEROLEMIA 05/09/2010  . GOUT 05/09/2010  . OBESITY 05/09/2010  . Obstructive sleep apnea 05/09/2010  . Essential hypertension 05/09/2010  . VENTRICULAR TACHYCARDIA 05/09/2010    Lanice Shirts PT 01/17/2021, 4:56 PM  [facility] [address] [place], [place], [address] Phone: [phone]   Fax:  [phone]  Name: [name] MRN: [id number] Date of Birth: 10/01/1933

## 2021-01-18 ENCOUNTER — Ambulatory Visit: Payer: Medicare Other

## 2021-01-18 ENCOUNTER — Ambulatory Visit: Payer: Medicare Other | Admitting: Occupational Therapy

## 2021-01-22 ENCOUNTER — Other Ambulatory Visit: Payer: Self-pay

## 2021-01-22 ENCOUNTER — Telehealth: Payer: Self-pay | Admitting: Neurology

## 2021-01-22 ENCOUNTER — Emergency Department (HOSPITAL_BASED_OUTPATIENT_CLINIC_OR_DEPARTMENT_OTHER)
Admission: EM | Admit: 2021-01-22 | Discharge: 2021-01-22 | Disposition: A | Discharge status: Home / Self Care | Payer: Medicare Other | Attending: Emergency Medicine | Admitting: Emergency Medicine

## 2021-01-22 ENCOUNTER — Ambulatory Visit: Payer: Medicare Other | Admitting: Physical Therapy

## 2021-01-22 ENCOUNTER — Ambulatory Visit: Payer: Medicare Other | Admitting: Occupational Therapy

## 2021-01-22 ENCOUNTER — Emergency Department (HOSPITAL_BASED_OUTPATIENT_CLINIC_OR_DEPARTMENT_OTHER): Payer: Medicare Other

## 2021-01-22 ENCOUNTER — Encounter: Payer: Self-pay | Admitting: Physical Therapy

## 2021-01-22 ENCOUNTER — Other Ambulatory Visit: Payer: Self-pay | Admitting: Neurology

## 2021-01-22 ENCOUNTER — Encounter (HOSPITAL_BASED_OUTPATIENT_CLINIC_OR_DEPARTMENT_OTHER): Payer: Self-pay

## 2021-01-22 ENCOUNTER — Ambulatory Visit: Payer: Medicare Other | Admitting: Speech Pathology

## 2021-01-22 ENCOUNTER — Ambulatory Visit: Payer: Medicare Other

## 2021-01-22 VITALS — BP 134/77 | HR 82

## 2021-01-22 DIAGNOSIS — S0990XA Unspecified injury of head, initial encounter: Secondary | ICD-10-CM | POA: Diagnosis present

## 2021-01-22 DIAGNOSIS — S065X9A Traumatic subdural hemorrhage with loss of consciousness of unspecified duration, initial encounter: Secondary | ICD-10-CM

## 2021-01-22 DIAGNOSIS — S060X0A Concussion without loss of consciousness, initial encounter: Secondary | ICD-10-CM

## 2021-01-22 DIAGNOSIS — Z79899 Other long term (current) drug therapy: Secondary | ICD-10-CM | POA: Diagnosis not present

## 2021-01-22 DIAGNOSIS — Z87891 Personal history of nicotine dependence: Secondary | ICD-10-CM | POA: Insufficient documentation

## 2021-01-22 DIAGNOSIS — W01198A Fall on same level from slipping, tripping and stumbling with subsequent striking against other object, initial encounter: Secondary | ICD-10-CM | POA: Insufficient documentation

## 2021-01-22 DIAGNOSIS — J449 Chronic obstructive pulmonary disease, unspecified: Secondary | ICD-10-CM | POA: Insufficient documentation

## 2021-01-22 DIAGNOSIS — I10 Essential (primary) hypertension: Secondary | ICD-10-CM | POA: Diagnosis not present

## 2021-01-22 DIAGNOSIS — Y9289 Other specified places as the place of occurrence of the external cause: Secondary | ICD-10-CM | POA: Insufficient documentation

## 2021-01-22 DIAGNOSIS — M6281 Muscle weakness (generalized): Secondary | ICD-10-CM

## 2021-01-22 DIAGNOSIS — I2510 Atherosclerotic heart disease of native coronary artery without angina pectoris: Secondary | ICD-10-CM | POA: Diagnosis not present

## 2021-01-22 DIAGNOSIS — S065XAA Traumatic subdural hemorrhage with loss of consciousness status unknown, initial encounter: Secondary | ICD-10-CM

## 2021-01-22 DIAGNOSIS — Z7951 Long term (current) use of inhaled steroids: Secondary | ICD-10-CM | POA: Diagnosis not present

## 2021-01-22 DIAGNOSIS — Y9389 Activity, other specified: Secondary | ICD-10-CM | POA: Diagnosis not present

## 2021-01-22 DIAGNOSIS — R296 Repeated falls: Secondary | ICD-10-CM

## 2021-01-22 DIAGNOSIS — W19XXXA Unspecified fall, initial encounter: Secondary | ICD-10-CM

## 2021-01-22 LAB — CBC WITH DIFFERENTIAL/PLATELET
Abs Immature Granulocytes: 0.01 10*3/uL (ref 0.00–0.07)
Basophils Absolute: 0 10*3/uL (ref 0.0–0.1)
Basophils Relative: 1 %
Eosinophils Absolute: 0.1 10*3/uL (ref 0.0–0.5)
Eosinophils Relative: 1 %
HCT: 39.1 % (ref 39.0–52.0)
Hemoglobin: 12.5 g/dL — ABNORMAL LOW (ref 13.0–17.0)
Immature Granulocytes: 0 %
Lymphocytes Relative: 17 %
Lymphs Abs: 1.1 10*3/uL (ref 0.7–4.0)
MCH: 31.4 pg (ref 26.0–34.0)
MCHC: 32 g/dL (ref 30.0–36.0)
MCV: 98.2 fL (ref 80.0–100.0)
Monocytes Absolute: 0.5 10*3/uL (ref 0.1–1.0)
Monocytes Relative: 7 %
Neutro Abs: 4.8 10*3/uL (ref 1.7–7.7)
Neutrophils Relative %: 74 %
Platelets: 152 10*3/uL (ref 150–400)
RBC: 3.98 MIL/uL — ABNORMAL LOW (ref 4.22–5.81)
RDW: 15.7 % — ABNORMAL HIGH (ref 11.5–15.5)
WBC: 6.4 10*3/uL (ref 4.0–10.5)
nRBC: 0 % (ref 0.0–0.2)

## 2021-01-22 LAB — CBG MONITORING, ED: Glucose-Capillary: 85 mg/dL (ref 70–99)

## 2021-01-22 LAB — COMPREHENSIVE METABOLIC PANEL
ALT: 13 U/L (ref 0–44)
AST: 22 U/L (ref 15–41)
Albumin: 4.2 g/dL (ref 3.5–5.0)
Alkaline Phosphatase: 32 U/L — ABNORMAL LOW (ref 38–126)
Anion gap: 9 (ref 5–15)
BUN: 23 mg/dL (ref 8–23)
CO2: 33 mmol/L — ABNORMAL HIGH (ref 22–32)
Calcium: 8.9 mg/dL (ref 8.9–10.3)
Chloride: 99 mmol/L (ref 98–111)
Creatinine, Ser: 1.11 mg/dL (ref 0.61–1.24)
GFR, Estimated: >60 mL/min (ref >60)
Glucose, Bld: 90 mg/dL (ref 70–99)
Potassium: 3.3 mmol/L — ABNORMAL LOW (ref 3.5–5.1)
Sodium: 141 mmol/L (ref 135–145)
Total Bilirubin: 1 mg/dL (ref 0.3–1.2)
Total Protein: 6.3 g/dL — ABNORMAL LOW (ref 6.5–8.1)

## 2021-01-22 MED ORDER — LORAZEPAM 2 MG/ML IJ SOLN
0.5000 mg | Freq: Once | INTRAMUSCULAR | Status: DC
  Filled 2021-01-22: qty 1

## 2021-01-22 NOTE — ED Notes (Signed)
Patient was taken to CT on BIPAP/NIV by RN and RT. Patient tolerated transport with no complications noted. Upon return, lab were drawn and sent to lab.

## 2021-01-22 NOTE — Telephone Encounter (Signed)
Patient's daughter called back, [facility] is unable to do a CT of the head with his BiPAP machine.  Encouraged the patient and his daughter is the best course at this point would be to go to ER so that they can get imaging completed for him.  Patient will require getting placed under anesthesia most likely to have the procedure done.  Given the patient's history with subdural hematoma and the symptoms that he is having patient is at increased risk for having further bleeding on the brain.  She agrees with this plan and will take the patient to the ER.  Advised I will keep the patient scheduled for his upcoming appointment on Thursday and if for what ever reason he is admitted in the hospital then advised her to call and let us know and we will take him off the schedule. She was appreciative for the information and had no further questions at this time.

## 2021-01-22 NOTE — Telephone Encounter (Signed)
Patient's daughter also brought the patient to see when he could be seen.  He recently had a fall on Saturday, he very is confused and off balance and he was doing well. He did hit the back of head on in the kitchen table.  Was able to get the patient scheduled for a visit on Thursday here in the office.  In the meantime we will discuss with Dr. Brett Fairy on if she agrees that a CT scan of the head would be beneficial.  I will call the daughter back with her recommendation.  he very is confused and off balance and he was doing well. He did hit back of head on in the kitchen

## 2021-01-22 NOTE — Therapy (Signed)
[facility] [address] [place], [place], [address] Phone: [phone]   Fax:  [phone]  Physical Therapy Treatment - Arrived No Charge   Patient Details  Name: [name] MRN: [id number] Date of Birth: [birth year] Referring Provider (PT): Dohmeier, Asencion Partridge, MD   Encounter Date: 01/22/2021   PT End of Session - 01/22/21 1354    Visit Number 20   arrived no charge   Number of Visits 24    Date for PT Re-Evaluation 03/03/21    Authorization Type UHC Medicare    Progress Note Due on Visit 20    PT Start Time 1313    PT Stop Time 1350    PT Time Calculation (min) 37 min           Past Medical History:  Diagnosis Date  . Afib ([place])   . Arthritis   . BPH (benign prostatic hyperplasia)   . COPD (chronic obstructive pulmonary disease) ([place])   . Coronary artery disease   . Coronary artery ectasia    Mild CAD with normal systolic function per cath in August of 2011  . Depression   . Diaphragmatic paralysis    felt to be partially responsible for dyspnea  . Dyspnea    due to paralyzed diaphragm and pulmonary issues  . Dysrhythmia   . GERD (gastroesophageal reflux disease)   . Gout   . HTN (hypertension)   . Hypercholesteremia   . Hypertension   . Iron deficiency anemia   . Microhematuria   . Obesity   . OSA (obstructive sleep apnea)    CPAP  . Paroxysmal atrial fibrillation (H[facility])    occured in the setting of acute E Coli sepsis and ileius 7/12  . PVC (premature ventricular contraction)    s/p PVC ablation 05/29/2010    Past Surgical History:  Procedure Laterality Date  . APPENDECTOMY  1978  . BIOPSY  02/09/2019   Procedure: BIOPSY;  Surgeon: Clarene Essex, MD;  [facility];  Service: Endoscopy;;  . BIOPSY  03/02/2019   Procedure: BIOPSY;  Surgeon: Clarene Essex, MD;  [facility];  Service: Endoscopy;;  . CARDIAC CATHETERIZATION  04-30-2010   Left main coronary artery is normal.    . CARDIOVASCULAR STRESS TEST  03-19-2010   0%  . CHOLECYSTECTOMY  1990  . COLONOSCOPY N/A 03/02/2019   Procedure: COLONOSCOPY;  Surgeon: Clarene Essex, MD;  [facility];  Service: Endoscopy;  Laterality: N/A;  . COLONOSCOPY W/ POLYPECTOMY    . ESOPHAGOGASTRODUODENOSCOPY (EGD) WITH PROPOFOL N/A 02/09/2019   Procedure: ESOPHAGOGASTRODUODENOSCOPY (EGD) WITH PROPOFOL;  Surgeon: Clarene Essex, MD;  [facility];  Service: Endoscopy;  Laterality: N/A;  . EYE SURGERY Right    catarct  . poly removed from nose as a child    . PVC ablation  05/29/2010  . RADIOLOGY WITH ANESTHESIA N/A 11/07/2020   Procedure: MRV HEAD WITH AND WITHOUT;  Surgeon: Radiologist, Medication, MD;  Location: [facility];  Service: Radiology;  Laterality: N/A;  . RADIOLOGY WITH ANESTHESIA N/A 11/07/2020   Procedure: CT LUMBAR SPINE WITHOUT;  Surgeon: Radiologist, Medication, MD;  Location: [facility];  Service: Radiology;  Laterality: N/A;  . US ECHOCARDIOGRAPHY  03-09-2010   EF 55-60%    Vitals:   01/22/21 1327  BP: 134/77  Pulse: 82     Subjective Assessment - 01/22/21 1318    Subjective Has had 2 falls since he was last here. Had one fall - was doing standing exercises  at the dresser and fell towards the side. Needed assist from pt's spouse and pt's neighbor to help get up.  Also had another fall in the kitchen and hit his head and onto his back. Was standing there with his rollator. Has felt more imbalanced from the first fall.    Patient is accompained by: Family member   wife, [name]   Pertinent History HTN, CAD, COPD, A-fib    Limitations Standing;Walking    How long can you sit comfortably? 22'    How long can you stand comfortably? 77'    How long can you walk comfortably? 35'    Patient Stated Goals wants to improve his balance    Currently in Pain? No/denies    Pain Onset Today             Pt came into session today more unbalanced walking with SPC with quad tip, needing min guard/min A when walking  back to clinic. Pt had 2 falls over the weekend - one where he hit his head falling backwards. Pt reports he has been feeling more unbalanced since his falls and weaker. Pt with slightly incr weakness in RLE today. Discussed with pt and pt's daughter that due to recent fall and changes in gait/balance and feeling weaker, would want to send pt to the ED to make sure that nothing else is going on or pt might possibly need imaging. Pt stated he did not want to go to the ED, but pt's daughter verbalized understanding. Pt's daughter asked about calling pt's neurologist office - PT called pt's neurologist office and updated on pt and gave call back number for PT and pt's daughter to receive a call back from the nurse. Ambulated with pt out to the front lobby, with pt and pt's daughter going next door to [place] before they leave to see if they could speak to a nurse.                           PT Short Term Goals - 11/28/20 1455      PT SHORT TERM GOAL #1   Title Pt will initiate HEP in order to indicate improved functional mobility and decreased fall risk.  ALL STGS DUE 11/21/20    Baseline reviewed HEP on 11/28/20    Time 4    Period Weeks    Status Achieved    Target Date 11/21/20      PT SHORT TERM GOAL #2   Title Pt will perform 5x sit <> stand in 17 seconds or less with BUE support in order to demo improved functional strength.    Baseline 19.31 seconds, 10.97 seconds with BUE support on 11/28/20    Time 4    Period Weeks    Status Achieved      PT SHORT TERM GOAL #3   Title Pt will undergo BERG with STG and LTG written as appropriate.    Baseline 10/27/20 BERG 37/56    Time 4    Period Weeks    Status Achieved      PT SHORT TERM GOAL #4   Title Pt will improve gait speed with rollator to at least 2.5 ft/sec in order demo improved community mobility.    Baseline 2.18 ft/sec, 14.41 seconds = 2.27 ft/sec on 3/22    Time 4    Period Weeks    Status Not Met      PT SHORT  TERM GOAL #5  Title Pt and pt's spouse will verbalize understanding of fall prevention in the home to decr fall risk.    Time 4    Period Weeks    Status New             PT Long Term Goals - 01/16/21 1653      PT LONG TERM GOAL #1   Title Pt will be independent with final HEP in order to indicate improved functional mobility and decreased fall risk.  ALL LTGS DUE 01/30/21    Baseline updated on 01/02/21 - will benefit from on going additions    Time 4    Period Weeks    Status On-going      PT LONG TERM GOAL #2   Title Pt to improve BERG score to at least a 44/56 in order to demo decr fall risk.    Baseline 37; 41/56 on 12/19/20; 01/16/21 BERG score 44    Time 4    Period Weeks    Status On-going      PT LONG TERM GOAL #3   Title Pt will improve gait speed to at least 2.1 ft/sec in order to demo improved gait efficiency.    Baseline 1.92 ft/sec with SPC with quad tip; 01/16/21 gait velocity 1.40f/s    Time 4    Period Weeks    Status On-going      PT LONG TERM GOAL #4   Title Pt will ambulate at least 230' over indoor surfaces with cane vs. LRAD with supervision in order to demo improved household mobility.    Baseline supervision/min guard with SPC with quad tip on 12/19/20; 01/16/21 Ambulation of 2316facross level ground with SPC and SBA    Time 4    Period Weeks    Status On-going      PT LONG TERM GOAL #5   Title Pt will perform TUG in 13.5 seconds or less with SPC with quad tip in order to demo decr fall risk.    Baseline 14.5 seconds with SPC with 4 prong tip; 01/16/21 TUG score 14.67s performed with small tip SPC    Time 4    Period Weeks    Status On-going                  Patient will benefit from skilled therapeutic intervention in order to improve the following deficits and impairments:     Visit Diagnosis: Muscle weakness (generalized)     Problem List Patient Active Problem List   Diagnosis Date Noted  . AAA (abdominal aortic aneurysm)  without rupture ([facility]03/04/2021  . Subdural hematoma ([facility]12/03/2020  . Iron deficiency anemia due to chronic blood loss 01/06/2019  . Orthostatic hypotension 01/05/2019    Class: Question of  . Normocytic normochromic anemia 01/05/2019  . Near syncope 01/05/2019  . Chronic respiratory failure with hypoxia ([facility]10/22/2019  . OSA (obstructive sleep apnea) 02/11/2018  . Physical deconditioning 08/11/2017  . Shortness of breath 10/24/2014  . COPD (chronic obstructive pulmonary disease) with emphysema ([facility]12/24/2015  . Fatigue 10/14/2011  . Atrial fibrillation ([facility]09/01/2011  . Premature ventricular contractions (PVCs) ([facility]) 02/25/2011  . 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  . PERSISTENT DISORDER INITIATING/MAINTAINING SLEEP 05/10/2010  . HYPERCHOLESTEROLEMIA 05/09/2010  . GOUT 05/09/2010  . OBESITY 05/09/2010  . Obstructive sleep apnea 05/09/2010  . Essential hypertension 05/09/2010  . VENTRICULAR TACHYCARDIA 05/09/2010    ChArliss JourneyPT, DPT  01/22/2021, 1:55 PM  [facility] [address] [place], [place], [address] Phone: [phone]   Fax:  [phone]  Name: [name] MRN: [id number] Date of Birth: [birth year]

## 2021-01-22 NOTE — Discharge Instructions (Addendum)
You may have gotten a mild concussion after the fall and hitting your head but the CAT scan today showed no evidence of bleeding.  Your labs including your blood counts, kidney function and electrolytes were all within normal limits.  Continue to take your blood pressure medications as prescribed and follow-up with your heart doctor if your blood pressure remains higher than they would like.

## 2021-01-22 NOTE — ED Notes (Signed)
Patient transported to CT on bipap with RT Jinny Blossom and RN Wille Glaser

## 2021-01-22 NOTE — Telephone Encounter (Signed)
I appreciate the ED visit  advise for this patient. CD

## 2021-01-22 NOTE — ED Provider Notes (Signed)
[facility] EMERGENCY DEPT Provider Note   CSN: [id number] Arrival date & time: 01/22/21  1543     History Chief Complaint  Patient presents with  . Fall    [name] is a 87 y.o. male.  Patient is an 87 year old male with multiple medical problems including COPD, diaphragmatic paralysis, hypertension, paroxysmal atrial fibrillation not on anticoagulation, subdural hemorrhage in December 2021, ongoing gait abnormalities who is presenting today for evaluation after a fall and hitting his head.  Patient reports on Saturday is when he fell which is 2 days prior to arrival.  He was using his walker but lost his balance and fell backward hitting the back of his head on the wood floor.  He denies any loss of consciousness.  He seemed to be doing fine after that but today he has seemed more shaky and having more trouble ambulating.  He has had no further falls.  He denies any neck pain or headache.  He has had no visual changes.  No nausea, vomiting.  He is still eating appropriately.  No abdominal pain, urinary complaints and no fever.  He went to therapy today because he was shakier and not quite at his baseline they were concerned and he spoke with his neurologist to recommend that he come to the hospital to get a head CT.  Patient reports that because of his diaphragmatic paralysis and back pain he is unable to lay down for the CT and needs to be sedated.  In the past he had to be sedated for MRI and CT.  Patient was able to ambulate into the room with his cane but was shaky.  The history is provided by the patient.  Fall       Past Medical History:  Diagnosis Date  . Afib ([place])   . Arthritis   . BPH (benign prostatic hyperplasia)   . COPD (chronic obstructive pulmonary disease) ([place])   . Coronary artery disease   . Coronary artery ectasia    Mild CAD with normal systolic function per cath in August of 2011  . Depression   . Diaphragmatic paralysis    felt to be  partially responsible for dyspnea  . Dyspnea    due to paralyzed diaphragm and pulmonary issues  . Dysrhythmia   . GERD (gastroesophageal reflux disease)   . Gout   . HTN (hypertension)   . Hypercholesteremia   . Hypertension   . Iron deficiency anemia   . Microhematuria   . Obesity   . OSA (obstructive sleep apnea)    CPAP  . Paroxysmal atrial fibrillation ([facility])    occured in the setting of acute E Coli sepsis and ileius 7/12  . PVC (premature ventricular contraction)    s/p PVC ablation 05/29/2010    Patient Active Problem List   Diagnosis Date Noted  . AAA (abdominal aortic aneurysm) without rupture ([place]) 11/14/2020  . Subdural hematoma ([place]) 08/15/2020  . Iron deficiency anemia due to chronic blood loss 01/06/2019  . Orthostatic hypotension 01/05/2019    Class: Question of  . Normocytic normochromic anemia 01/05/2019  . Near syncope 01/05/2019  . Chronic respiratory failure with hypoxia ([place]) 06/30/2018  . OSA (obstructive sleep apnea) 02/11/2018  . Physical deconditioning 08/11/2017  . Shortness of breath 10/24/2014  . COPD (chronic obstructive pulmonary disease) with emphysema ([place]) 09/01/2014  . Fatigue 10/14/2011  . Atrial fibrillation ([place]) 05/15/2011  . Premature ventricular contractions (PVCs) ([facility]) 02/25/2011  . 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  .  PERSISTENT DISORDER INITIATING/MAINTAINING SLEEP 05/10/2010  . HYPERCHOLESTEROLEMIA 05/09/2010  . GOUT 05/09/2010  . OBESITY 05/09/2010  . Obstructive sleep apnea 05/09/2010  . Essential hypertension 05/09/2010  . VENTRICULAR TACHYCARDIA 05/09/2010    Past Surgical History:  Procedure Laterality Date  . APPENDECTOMY  1978  . BIOPSY  02/09/2019   Procedure: BIOPSY;  Surgeon: Clarene Essex, MD;  Location: [facility];  Service: Endoscopy;;  . BIOPSY  03/02/2019   Procedure: BIOPSY;  Surgeon: Clarene Essex, MD;  Location: [facility];  Service: Endoscopy;;  . CARDIAC CATHETERIZATION  04-30-2010   Left main coronary  artery is normal.   . CARDIOVASCULAR STRESS TEST  03-19-2010   0%  . CHOLECYSTECTOMY  1990  . COLONOSCOPY N/A 03/02/2019   Procedure: COLONOSCOPY;  Surgeon: Clarene Essex, MD;  Location: [facility];  Service: Endoscopy;  Laterality: N/A;  . COLONOSCOPY W/ POLYPECTOMY    . ESOPHAGOGASTRODUODENOSCOPY (EGD) WITH PROPOFOL N/A 02/09/2019   Procedure: ESOPHAGOGASTRODUODENOSCOPY (EGD) WITH PROPOFOL;  Surgeon: Clarene Essex, MD;  Location: [facility];  Service: Endoscopy;  Laterality: N/A;  . EYE SURGERY Right    catarct  . poly removed from nose as a child    . PVC ablation  05/29/2010  . RADIOLOGY WITH ANESTHESIA N/A 11/07/2020   Procedure: MRV HEAD WITH AND WITHOUT;  Surgeon: Radiologist, Medication, MD;  Location: [facility];  Service: Radiology;  Laterality: N/A;  . RADIOLOGY WITH ANESTHESIA N/A 11/07/2020   Procedure: CT LUMBAR SPINE WITHOUT;  Surgeon: Radiologist, Medication, MD;  Location: [facility];  Service: Radiology;  Laterality: N/A;  . US ECHOCARDIOGRAPHY  03-09-2010   EF 55-60%       Family History  Problem Relation Age of Onset  . Aneurysm Father 64       brain aneurysm  . Breast cancer Mother   . Breast cancer Daughter     Social History   Tobacco Use  . Smoking status: Former Smoker    Types: Cigarettes    Quit date: 1985    Years since quitting: 37.3  . Smokeless tobacco: Never Used  Vaping Use  . Vaping Use: Never used  Substance Use Topics  . Alcohol use: Not Currently  . Drug use: Never    Home Medications Prior to Admission medications   Medication Sig Start Date End Date Taking? Authorizing Provider  acetaminophen (TYLENOL) 325 MG tablet Take 2 tablets (650 mg total) by mouth every 6 (six) hours as needed for mild pain. 08/17/20   Meuth, Brooke A, PA-C  allopurinol (ZYLOPRIM) 300 MG tablet Take 300 mg by mouth daily. 06/02/20   [provider]  ANORO ELLIPTA 62.5-25 MCG/INH AEPB Inhale 1 puff into the lungs daily. 06/17/20   [provider]  buPROPion  (WELLBUTRIN SR) 100 MG 12 hr tablet Take 100 mg by mouth 2 (two) times daily.  06/16/20   [provider]  doxazosin (CARDURA) 4 MG tablet Take 0.5 tablets (2 mg total) by mouth at bedtime. 04/19/20   Burtis Junes, NP  ferrous sulfate 325 (65 FE) MG tablet Take 325 mg by mouth daily with breakfast.    [provider]  hydrALAZINE (APRESOLINE) 25 MG tablet Take 1 tablet (25 mg total) by mouth as needed (If pressure is 160/95 take one tablet.). 11/17/20 02/15/21  Richardson Dopp T, PA-C  hydrochlorothiazide (HYDRODIURIL) 25 MG tablet Take 25 mg by mouth daily. 06/12/20   [provider]  lovastatin (MEVACOR) 40 MG tablet Take 40 mg by mouth daily. 07/31/20  [provider]  OXYGEN Inhale 2 L/min into the lungs daily as needed (SOB).     [provider]  pantoprazole (PROTONIX) 40 MG tablet Take 40 mg by mouth daily. 07/28/20   [provider]  sertraline (ZOLOFT) 100 MG tablet Take 100 mg by mouth daily. 06/19/20   [provider]  traZODone (DESYREL) 100 MG tablet Take 100 mg by mouth at bedtime.  06/12/20   [provider]  vitamin B-12 (CYANOCOBALAMIN) 500 MCG tablet Take 500 mcg by mouth daily.    [provider]    Allergies    Patient has no known allergies.  Review of Systems   Review of Systems  All other systems reviewed and are negative.   Physical Exam Updated Vital Signs BP (!) 160/89 (BP Location: Left Arm)   Pulse 87   Temp 98.6 F (37 C) (Oral)   Resp 20   Ht 5\' 10"  (1.778 m)   Wt 87.5 kg   SpO2 96%   BMI 27.69 kg/m   Physical Exam Vitals and nursing note reviewed.  Constitutional:      General: He is not in acute distress.    Appearance: He is well-developed.  HENT:     Head: Normocephalic and atraumatic.     Mouth/Throat:     Mouth: Mucous membranes are moist.  Eyes:     Extraocular Movements: Extraocular movements intact.     Conjunctiva/sclera: Conjunctivae normal.     Pupils:  Pupils are equal, round, and reactive to light.  Cardiovascular:     Rate and Rhythm: Normal rate and regular rhythm.     Pulses: Normal pulses.     Heart sounds: No murmur heard.   Pulmonary:     Effort: Pulmonary effort is normal. No respiratory distress.     Breath sounds: Normal breath sounds. No wheezing or rales.  Abdominal:     General: There is no distension.     Palpations: Abdomen is soft.     Tenderness: There is no abdominal tenderness. There is no guarding or rebound.  Musculoskeletal:        General: No tenderness. Normal range of motion.     Cervical back: Normal range of motion and neck supple. No tenderness. No spinous process tenderness or muscular tenderness.     Right lower leg: No edema.     Left lower leg: No edema.  Skin:    General: Skin is warm and dry.     Findings: No erythema or rash.  Neurological:     Mental Status: He is alert and oriented to person, place, and time. Mental status is at baseline.     Sensory: No sensory deficit.     Comments: 4 out of 5 strength in bilateral lower extremities.  No pronator drift.  Slow purposeful gait with mild unsteadiness  Psychiatric:        Mood and Affect: Mood normal.        Behavior: Behavior normal.      ED Results / Procedures / Treatments   Labs (all labs ordered are listed, but only abnormal results are displayed) Labs Reviewed  CBC WITH DIFFERENTIAL/PLATELET - Abnormal; Notable for the following components:      Result Value   RBC 3.98 (*)    Hemoglobin 12.5 (*)    RDW 15.7 (*)    All other components within normal limits  COMPREHENSIVE METABOLIC PANEL - Abnormal; Notable for the following components:   Potassium 3.3 (*)  CO2 33 (*)    Total Protein 6.3 (*)    Alkaline Phosphatase 32 (*)    All other components within normal limits  CBG MONITORING, ED    EKG None  Radiology CT Head Wo Contrast  Result Date: 01/22/2021 CLINICAL DATA:  Fall several days ago with persistent headaches,  initial encounter EXAM: CT HEAD WITHOUT CONTRAST TECHNIQUE: Contiguous axial images were obtained from the base of the skull through the vertex without intravenous contrast. COMPARISON:  08/15/2020 FINDINGS: Brain: No evidence of acute infarction, hemorrhage, hydrocephalus, extra-axial collection or mass lesion/mass effect. Mild atrophic and chronic white matter ischemic changes are noted. Vascular: No hyperdense vessel or unexpected calcification. Skull: Normal. Negative for fracture or focal lesion. Sinuses/Orbits: No acute finding. Other: None. IMPRESSION: Chronic atrophic and ischemic changes. Electronically Signed   By: Inez Catalina M.D.   On: 01/22/2021 17:59    Procedures Procedures   Medications Ordered in ED Medications  LORazepam (ATIVAN) injection 0.5 mg (has no administration in time range)    ED Course  I have reviewed the triage vital signs and the nursing notes.  Pertinent labs & imaging results that were available during my care of the patient were reviewed by me and considered in my medical decision making (see chart for details).    MDM Rules/Calculators/A&P                          Patient presenting after a fall on Saturday and has been more shaky today.  He did hit his head during the fall 2 days ago.  Patient does not take any anticoagulation.  On exam patient is awake and alert.  He is in no acute distress at this time.  He does report that to lay down he needs to use BiPAP because of his diaphragmatic paralysis but denies any shortness of breath at this time.  Do not know why it would be unreasonable to place patient on BiPAP to get the head CT.  It should not affect imaging.  He has no neck pain or other concerns at this time.  We will check a CBC and labs to ensure that that is not what is causing his shakiness.  6:40 PM Blood sugar, CBC and CMP without acute findings.  Patient's head CT is negative for bleed.  Encourage patient to continue his current medications,  follow-up with his neurologist if symptoms or not improving.  MDM Number of Diagnoses or Management Options   Amount and/or Complexity of Data Reviewed Clinical lab tests: ordered and reviewed Tests in the radiology section of CPT: ordered and reviewed Independent visualization of images, tracings, or specimens: yes    Final Clinical Impression(s) / ED Diagnoses Final diagnoses:  Fall, initial encounter  Concussion without loss of consciousness, initial encounter    Rx / DC Orders ED Discharge Orders    None       Blanchie Dessert, MD 01/22/21 1841

## 2021-01-22 NOTE — ED Notes (Addendum)
Pt stated that he is agitated and that he is ready to go and thinks that why his blood pressure is currently high. Pt stated that he was told by his cardiologist to take BP med is over 160 and that after he takes it it will go down.  MD aware of same.

## 2021-01-22 NOTE — ED Triage Notes (Signed)
Pt with hx of frequent falls. Pt last had a fall on 5/14. Denies LOC and was assisted out of the floor. Pt here today due to reports of shakiness and confusion since the fall. Pt A/O x4 at time of triage.

## 2021-01-22 NOTE — Telephone Encounter (Signed)
[facility]) called,Pt came to Physical Therapy today, he has had 2 falls, more balanced. Need advice on if we need to send him or CT Scan due to past brain bleeds. Would like a call from the nurse. Can also call his daughter, Bonnita Nasuti at [phone]

## 2021-01-22 NOTE — Telephone Encounter (Signed)
Spoke with Dr Brett Fairy and was able to review this incident with her. She agrees pt should have a CT of head completed to rule out brain bleed. Order was placed for stat. I will contact the patient's daughter to inform her of this as well.  Called the daughter she is concerned that with doing the CT he can't lay flat without bipap machine. Advised that they can clairfy but with it being CT scan I would not think it would be a problem with the patient wearing the bipap to complete CT scan. They just need to have a power cord outlet and the machine can get in between patient legs.she will call [facility] to check on this.

## 2021-01-23 ENCOUNTER — Ambulatory Visit: Payer: Medicare Other

## 2021-01-23 ENCOUNTER — Encounter: Payer: Medicare Other | Admitting: Occupational Therapy

## 2021-01-23 ENCOUNTER — Ambulatory Visit: Payer: Medicare Other | Admitting: Physical Therapy

## 2021-01-24 ENCOUNTER — Ambulatory Visit: Payer: Medicare Other | Admitting: Speech Pathology

## 2021-01-24 ENCOUNTER — Ambulatory Visit: Payer: Medicare Other

## 2021-01-24 ENCOUNTER — Ambulatory Visit: Payer: Medicare Other | Admitting: Physical Therapy

## 2021-01-24 ENCOUNTER — Ambulatory Visit: Payer: Medicare Other | Admitting: Occupational Therapy

## 2021-01-25 ENCOUNTER — Ambulatory Visit: Payer: Self-pay | Admitting: Neurology

## 2021-01-25 ENCOUNTER — Encounter: Payer: Self-pay | Admitting: Neurology

## 2021-01-30 ENCOUNTER — Ambulatory Visit: Payer: Medicare Other | Admitting: Physical Therapy

## 2021-01-30 ENCOUNTER — Encounter: Payer: Self-pay | Admitting: Physical Therapy

## 2021-01-30 ENCOUNTER — Ambulatory Visit: Payer: Medicare Other | Admitting: Occupational Therapy

## 2021-01-30 ENCOUNTER — Encounter: Payer: Self-pay | Admitting: Occupational Therapy

## 2021-01-30 ENCOUNTER — Other Ambulatory Visit: Payer: Self-pay

## 2021-01-30 DIAGNOSIS — R2689 Other abnormalities of gait and mobility: Secondary | ICD-10-CM

## 2021-01-30 DIAGNOSIS — R41840 Attention and concentration deficit: Secondary | ICD-10-CM

## 2021-01-30 DIAGNOSIS — R41844 Frontal lobe and executive function deficit: Secondary | ICD-10-CM

## 2021-01-30 DIAGNOSIS — R278 Other lack of coordination: Secondary | ICD-10-CM

## 2021-01-30 DIAGNOSIS — M6281 Muscle weakness (generalized): Secondary | ICD-10-CM | POA: Diagnosis not present

## 2021-01-30 DIAGNOSIS — R2681 Unsteadiness on feet: Secondary | ICD-10-CM

## 2021-01-30 NOTE — Therapy (Addendum)
[facility] [address] [place], [place], [address] Phone: [phone]   Fax:  [phone]  Physical Therapy Treatment  Patient Details  Name: [name] MRN: [id number] Date of Birth: 10/01/1933 Referring Provider (PT): Dohmeier, Asencion Partridge, MD   Encounter Date: 01/30/2021   PT End of Session - 01/30/21 1630    Visit Number 21    Number of Visits 24    Date for PT Re-Evaluation 03/03/21    Authorization Type UHC Medicare    Progress Note Due on Visit 20    PT Start Time 1530    PT Stop Time 1611    PT Time Calculation (min) 41 min    Equipment Utilized During Treatment Gait belt    Activity Tolerance Patient tolerated treatment well;Patient limited by fatigue    Behavior During Therapy [facility] for tasks assessed/performed           Past Medical History:  Diagnosis Date  . Afib ([place])   . Arthritis   . BPH (benign prostatic hyperplasia)   . COPD (chronic obstructive pulmonary disease) ([place])   . Coronary artery disease   . Coronary artery ectasia    Mild CAD with normal systolic function per cath in August of 2011  . Depression   . Diaphragmatic paralysis    felt to be partially responsible for dyspnea  . Dyspnea    due to paralyzed diaphragm and pulmonary issues  . Dysrhythmia   . GERD (gastroesophageal reflux disease)   . Gout   . HTN (hypertension)   . Hypercholesteremia   . Hypertension   . Iron deficiency anemia   . Microhematuria   . Obesity   . OSA (obstructive sleep apnea)    CPAP  . Paroxysmal atrial fibrillation (HCC)    occured in the setting of acute E Coli sepsis and ileius 7/12  . PVC (premature ventricular contraction)    s/p PVC ablation 05/29/2010    Past Surgical History:  Procedure Laterality Date  . APPENDECTOMY  1978  . BIOPSY  02/09/2019   Procedure: BIOPSY;  Surgeon: Clarene Essex, MD;  Location: [facility];  Service: Endoscopy;;  . BIOPSY  03/02/2019   Procedure: BIOPSY;   Surgeon: Clarene Essex, MD;  Location: [facility];  Service: Endoscopy;;  . CARDIAC CATHETERIZATION  04-30-2010   Left main coronary artery is normal.   . CARDIOVASCULAR STRESS TEST  03-19-2010   0%  . CHOLECYSTECTOMY  1990  . COLONOSCOPY N/A 03/02/2019   Procedure: COLONOSCOPY;  Surgeon: Clarene Essex, MD;  [facility];  Service: Endoscopy;  Laterality: N/A;  . COLONOSCOPY W/ POLYPECTOMY    . ESOPHAGOGASTRODUODENOSCOPY (EGD) WITH PROPOFOL N/A 02/09/2019   Procedure: ESOPHAGOGASTRODUODENOSCOPY (EGD) WITH PROPOFOL;  Surgeon: Clarene Essex, MD;  [facility];  Service: Endoscopy;  Laterality: N/A;  . EYE SURGERY Right    catarct  . poly removed from nose as a child    . PVC ablation  05/29/2010  . RADIOLOGY WITH ANESTHESIA N/A 11/07/2020   Procedure: MRV HEAD WITH AND WITHOUT;  Surgeon: Radiologist, Medication, MD;  Location: [facility];  Service: Radiology;  Laterality: N/A;  . RADIOLOGY WITH ANESTHESIA N/A 11/07/2020   Procedure: CT LUMBAR SPINE WITHOUT;  Surgeon: Radiologist, Medication, MD;  Location: [facility];  Service: Radiology;  Laterality: N/A;  . US ECHOCARDIOGRAPHY  03-09-2010   EF 55-60%    There were no vitals filed for this visit.   Subjective Assessment - 01/30/21 1534    Subjective  Went to the ED and had a CT head done - no acute changes. Was a mild concussion from his fall. No falls. Wife wanting to work on fall prevention.    Patient is accompained by: Family member   wife, [name]   Pertinent History HTN, CAD, COPD, A-fib    Limitations Standing;Walking    How long can you sit comfortably? 19'    How long can you stand comfortably? 35'    How long can you walk comfortably? 22'    Patient Stated Goals wants to improve his balance    Currently in Pain? Yes    Pain Score 6     Pain Location Coccyx    Pain Orientation Lower    Pain Descriptors / Indicators Sore    Pain Type Acute pain    Pain Onset Today                            Therapeutic  Activity -discussed POC going forward with pt and pt's spouse; pt will be wrapping up with OT/ST at this time but wishes to continue with PT due to recent falls and a decline in his balance/gait. Discussed will plan to re-cert for an additional month and then will re-assess, both in agreement.  -discussed fall prevention with pt having a life alert - pt reports he has one and wears it at all times when he is at home -educated on use of rollator at all times when ambulating downstairs and out in the community and to just use the cane upstairs where he can't fit rollator -pt's spouse brought in pt's gait belt from home and wishing to work on fall recovery if pt has a future fall at home. Discussed first steps after having a fall is to make sure pt isn't injured/bleeding before he would attempt to get up - would need to call EMS. With red mat on floor; showed how to get down to floor (using pt's stronger LLE) with min A to lower down, from the floor pt sitting in a cross legged position, unable to get on hands and needs, but goes into tall kneeling and uses BUE support from mat table to pull into position with min A for BLE management. From tall kneel > needing min A for half kneel with LLE forwards. Takes incr time throughout due to SOB and having rest breaks. Needing min guard to steady once in standing and having a nearby chair for UE support. Performed one more rep, with pt needing more mod A and taking increased time to go to sitting to tall kneel and still needing min/mod A to stand. Discussed with pt and pt's spouse that safest thing to do would be to call EMS or having his neighbor to come and help as it would not be safe for pt and pt's spouse to attempt on their own due to therapist needing to provide more assist and would not want pt's spouse to injure herself. Both verbalized understanding.          PT Education - 01/31/21 0747    Education Details see TA.    Person(s) Educated Patient;Spouse     Methods Explanation;Demonstration    Comprehension Verbalized understanding;Returned demonstration            PT Short Term Goals - 11/28/20 1455      PT SHORT TERM GOAL #1   Title Pt will initiate HEP in order to indicate improved  functional mobility and decreased fall risk.  ALL STGS DUE 11/21/20    Baseline reviewed HEP on 11/28/20    Time 4    Period Weeks    Status Achieved    Target Date 11/21/20      PT SHORT TERM GOAL #2   Title Pt will perform 5x sit <> stand in 17 seconds or less with BUE support in order to demo improved functional strength.    Baseline 19.31 seconds, 10.97 seconds with BUE support on 11/28/20    Time 4    Period Weeks    Status Achieved      PT SHORT TERM GOAL #3   Title Pt will undergo BERG with STG and LTG written as appropriate.    Baseline 10/27/20 BERG 37/56    Time 4    Period Weeks    Status Achieved      PT SHORT TERM GOAL #4   Title Pt will improve gait speed with rollator to at least 2.5 ft/sec in order demo improved community mobility.    Baseline 2.18 ft/sec, 14.41 seconds = 2.27 ft/sec on 3/22    Time 4    Period Weeks    Status Not Met      PT SHORT TERM GOAL #5   Title Pt and pt's spouse will verbalize understanding of fall prevention in the home to decr fall risk.    Time 4    Period Weeks    Status New             PT Long Term Goals - 01/16/21 1653      PT LONG TERM GOAL #1   Title Pt will be independent with final HEP in order to indicate improved functional mobility and decreased fall risk.  ALL LTGS DUE 01/30/21    Baseline updated on 01/02/21 - will benefit from on going additions    Time 4    Period Weeks    Status On-going      PT LONG TERM GOAL #2   Title Pt to improve BERG score to at least a 44/56 in order to demo decr fall risk.    Baseline 37; 41/56 on 12/19/20; 01/16/21 BERG score 44    Time 4    Period Weeks    Status On-going      PT LONG TERM GOAL #3   Title Pt will improve gait speed to at least  2.1 ft/sec in order to demo improved gait efficiency.    Baseline 1.92 ft/sec with SPC with quad tip; 01/16/21 gait velocity 1.15f/s    Time 4    Period Weeks    Status On-going      PT LONG TERM GOAL #4   Title Pt will ambulate at least 230' over indoor surfaces with cane vs. LRAD with supervision in order to demo improved household mobility.    Baseline supervision/min guard with SPC with quad tip on 12/19/20; 01/16/21 Ambulation of 2336facross level ground with SPC and SBA    Time 4    Period Weeks    Status On-going      PT LONG TERM GOAL #5   Title Pt will perform TUG in 13.5 seconds or less with SPC with quad tip in order to demo decr fall risk.    Baseline 14.5 seconds with SPC with 4 prong tip; 01/16/21 TUG score 14.67s performed with small tip SPC    Time 4    Period Weeks    Status On-going  Plan - 01/31/21 0747    Clinical Impression Statement Pt returns to PT this week after 2 falls and having changes in his balance/strength. Went to the ED and got a CT head done and was negative for any acute changes, was found to have a mild concussion. Pt's spouse wishing to go over floor transfers today to help pt get up from the floor if he has a fall. Pt needing up to mod A at times to get into tall kneeling position from the floor (even with having BUE support on mat table) and min A to help get into half kneeling. Discussed that this would not be the safest way for his wife to help get pt up, would need to call neighbor over if available to come help or would need to call EMS. Will check goals at next session and plan to re-cert for PT for an additional 2x week for 4 weeks.    Personal Factors and Comorbidities Comorbidity 3+;Past/Current Experience    Comorbidities HTN, CAD, COPD, A-fib, hx of COVID march 2021    Examination-Activity Limitations Stand;Squat;Transfers;Reach Overhead;Locomotion Level    Examination-Participation Restrictions Community Activity;Driving     Stability/Clinical Decision Making Evolving/Moderate complexity    Rehab Potential Good    PT Frequency 2x / week    PT Duration 8 weeks    PT Treatment/Interventions ADLs/Self Care Home Management;DME Instruction;Gait training;Functional mobility training;Stair training;Neuromuscular re-education;Balance training;Therapeutic exercise;Therapeutic activities;Patient/family education;Vestibular;Energy conservation    PT Next Visit Plan check LTGs and assess measures after pt's recent falls. will need a recert for 2x week for 4 weeks    PT Home Exercise Plan [phone]    Consulted and Agree with Plan of Care Patient           Patient will benefit from skilled therapeutic intervention in order to improve the following deficits and impairments:  Abnormal gait,Decreased activity tolerance,Decreased balance,Decreased endurance,Decreased coordination,Decreased cognition,Decreased knowledge of use of DME,Decreased range of motion,Difficulty walking,Decreased safety awareness,Decreased strength  Visit Diagnosis: Muscle weakness (generalized)  Other lack of coordination  Unsteadiness on feet  Other abnormalities of gait and mobility     Problem List Patient Active Problem List   Diagnosis Date Noted  . AAA (abdominal aortic aneurysm) without rupture ([place]) 11/14/2020  . Subdural hematoma ([place]) 08/15/2020  . Iron deficiency anemia due to chronic blood loss 01/06/2019  . Orthostatic hypotension 01/05/2019    Class: Question of  . Normocytic normochromic anemia 01/05/2019  . Near syncope 01/05/2019  . Chronic respiratory failure with hypoxia ([place]) 06/30/2018  . OSA (obstructive sleep apnea) 02/11/2018  . Physical deconditioning 08/11/2017  . Shortness of breath 10/24/2014  . COPD (chronic obstructive pulmonary disease) with emphysema ([place]) 09/01/2014  . Fatigue 10/14/2011  . Atrial fibrillation ([place]) 05/15/2011  . Premature ventricular contractions (PVCs) ([facility]) 02/25/2011  .  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  . PERSISTENT DISORDER INITIATING/MAINTAINING SLEEP 05/10/2010  . HYPERCHOLESTEROLEMIA 05/09/2010  . GOUT 05/09/2010  . OBESITY 05/09/2010  . Obstructive sleep apnea 05/09/2010  . Essential hypertension 05/09/2010  . VENTRICULAR TACHYCARDIA 05/09/2010    Arliss Journey, PT, DPT  01/31/2021, 8:02 AM  [facility] [address] [place], [place], [address] Phone: [phone]   Fax:  [phone]  Name: [name] MRN: [id number] Date of Birth: [birth year]

## 2021-01-30 NOTE — Therapy (Signed)
[facility] [address] [place], [place], [address] Phone: [phone]   Fax:  [phone]  Occupational Therapy Treatment  Patient Details  Name: [name] MRN: [id number] Date of Birth: October 10, 1933 Referring Provider (OT): Dr. Brett Fairy (referred by hospitalist with send to neurology)   Encounter Date: 01/30/2021   OT End of Session - 01/30/21 1448    Visit Number 18    Number of Visits 25    Date for OT Re-Evaluation 01/24/21    Authorization Type UHC Medicare    Authorization Time Period 90 days, anticipate d/c after 8 weeks .    Authorization - Visit Number 18    Authorization - Number of Visits 20    OT Start Time 6283    OT Stop Time 1525    OT Time Calculation (min) 40 min    Activity Tolerance Patient tolerated treatment well    Behavior During Therapy WFL for tasks assessed/performed           Past Medical History:  Diagnosis Date  . Afib ([place])   . Arthritis   . BPH (benign prostatic hyperplasia)   . COPD (chronic obstructive pulmonary disease) ([place])   . Coronary artery disease   . Coronary artery ectasia    Mild CAD with normal systolic function per cath in August of 2011  . Depression   . Diaphragmatic paralysis    felt to be partially responsible for dyspnea  . Dyspnea    due to paralyzed diaphragm and pulmonary issues  . Dysrhythmia   . GERD (gastroesophageal reflux disease)   . Gout   . HTN (hypertension)   . Hypercholesteremia   . Hypertension   . Iron deficiency anemia   . Microhematuria   . Obesity   . OSA (obstructive sleep apnea)    CPAP  . Paroxysmal atrial fibrillation (HCC)    occured in the setting of acute E Coli sepsis and ileius 7/12  . PVC (premature ventricular contraction)    s/p PVC ablation 05/29/2010    Past Surgical History:  Procedure Laterality Date  . APPENDECTOMY  1978  . BIOPSY  02/09/2019   Procedure: BIOPSY;  Surgeon: Clarene Essex, MD;  [facility];  Service: Endoscopy;;  . BIOPSY  03/02/2019   Procedure: BIOPSY;  Surgeon: Clarene Essex, MD;  [facility];  Service: Endoscopy;;  . CARDIAC CATHETERIZATION  04-30-2010   Left main coronary artery is normal.   . CARDIOVASCULAR STRESS TEST  03-19-2010   0%  . CHOLECYSTECTOMY  1990  . COLONOSCOPY N/A 03/02/2019   Procedure: COLONOSCOPY;  Surgeon: Clarene Essex, MD;  [facility];  Service: Endoscopy;  Laterality: N/A;  . COLONOSCOPY W/ POLYPECTOMY    . ESOPHAGOGASTRODUODENOSCOPY (EGD) WITH PROPOFOL N/A 02/09/2019   Procedure: ESOPHAGOGASTRODUODENOSCOPY (EGD) WITH PROPOFOL;  Surgeon: Clarene Essex, MD;  [facility];  Service: Endoscopy;  Laterality: N/A;  . EYE SURGERY Right    catarct  . poly removed from nose as a child    . PVC ablation  05/29/2010  . RADIOLOGY WITH ANESTHESIA N/A 11/07/2020   Procedure: MRV HEAD WITH AND WITHOUT;  Surgeon: Radiologist, Medication, MD;  Location: [facility];  Service: Radiology;  Laterality: N/A;  . RADIOLOGY WITH ANESTHESIA N/A 11/07/2020   Procedure: CT LUMBAR SPINE WITHOUT;  Surgeon: Radiologist, Medication, MD;  Location: [facility];  Service: Radiology;  Laterality: N/A;  . US ECHOCARDIOGRAPHY  03-09-2010   EF 55-60%    There were no  vitals filed for this visit.   Subjective Assessment - 01/30/21 1448    Subjective  "they said i got a mild concussion but no bleed"    Pertinent History Pt had a fall while shopping in [employer] on 08/15/20. Pt sustained a parafalcine and tentorial SDH. Was discharged from the hospital on 08/17/20.PMH:HTN, CAD, COPD, A-fib    Patient Stated Goals improve balance and strength    Currently in Pain? Yes    Pain Score 6     Pain Location Coccyx    Pain Orientation Lower    Pain Descriptors / Indicators Sore    Pain Type Acute pain    Pain Onset In the past 7 days    Pain Frequency Constant             Grooved Pegs with LUE with min difficulty and min drops. Pt continues to present with tremors but  overall coordination has improved since evaluation.   Constant Therapy Alternating Symbols level 4 with 67% accuracy and 57.64s response time. Errors d/t left inattention and accidentally pressing "done" with left hand  Same Symbol level 7 with 82% accuracy and 80.6s response time.                     OT Short Term Goals - 12/14/20 1620      OT SHORT TERM GOAL #1   Title Pt/ caregeiver will be I with HEP. 11/29/20    Time 4    Period Weeks    Status Achieved   met, for coordination HEP   Target Date 11/29/20      OT SHORT TERM GOAL #2   Title Pt / wife will verbalize understanding of strategies to increase safety and I with ADLS.    Time 4    Period Weeks    Status Achieved      OT SHORT TERM GOAL #3   Title Pt will perfrom transitional movments for ADLS with close supervision and no LOB.    Baseline histroy of falls    Time 4    Period Weeks    Status Achieved      OT SHORT TERM GOAL #4   Title Pt will demonstrate improved fine motor coordination in his LUE as eveidenced by decreasing 9-hole peg test score by 4 secs. UPDATE: complete in 35 seconds or less with LUE    Baseline 45.72 LUE at eval    Time 4    Period Weeks    Status Revised   39.46s with LUE 12/14/2020     OT SHORT TERM GOAL #5   Title Pt will sequence a simple functional or ADL task  with 90% or better accuracy .    Time 4    Period Weeks    Status Achieved   93% on constant therapy     OT SHORT TERM GOAL #6   Title Pt will demonstrate improved fine motor coordination for ADLs as evidenced by decreasing 3 button/ unbutton time to 90 secs or less.    Time 4    Period Weeks    Status Achieved   49.25            OT Long Term Goals - 01/08/21 1541      OT LONG TERM GOAL #1   Title Pt/ wife will be I with updated HEP for proximal strength.    Time 12    Period Weeks    Status On-going  OT LONG TERM GOAL #2   Title Pt will demonstrate ability to perform simple beverage / snack prep  modifeied indpendent demonstraing good saftey awareness.    Time 12    Period Weeks    Status Achieved   pt reports doing this at home     OT LONG TERM GOAL #3   Title Pt will perforrm bathing and  dressing mod I in a reasonable amout of time    Baseline increased time required    Time 12    Period Weeks    Status Achieved   per pt report     OT LONG TERM GOAL #4   Title Pt will demonstrate adequate bilateral UE strength to retrieve a lightweight object (3 lbs) from eye level  shelf without dropping with right and left UE's individually.    Time 12    Period Weeks    Status Achieved      OT LONG TERM GOAL #5   Title Pt will increase standing functional reach test score to 10 inches or greater with RUE to minimize fall risk during ADLs.    Time 12    Period Weeks    Status On-going   6"     OT LONG TERM GOAL #6   Title Pt will perform physical and cognitive task with 90% accuracy    Time 12    Period Weeks    Status On-going   80% accuracy 01/08/21                Plan - 01/30/21 1458    Clinical Impression Statement Pt returns s/p fall and concussion.    OT Occupational Profile and History Detailed Assessment- Review of Records and additional review of physical, cognitive, psychosocial history related to current functional performance    Occupational performance deficits (Please refer to evaluation for details): ADL's;IADL's;Leisure;Social Participation    Body Structure / Function / Physical Skills ADL;UE functional use;Balance;FMC;GMC;Coordination;Decreased knowledge of use of DME;IADL;Dexterity;Strength;Mobility    Cognitive Skills Memory;Safety Awareness;Problem Solve;Sequencing;Thought;Understand;Attention    OT Frequency 2x / week    OT Duration 12 weeks   12 weeks total. renewal completed at week 5/visit 10 to address goals.   OT Treatment/Interventions Self-care/ADL training;Energy conservation;Patient/family education;DME and/or AE instruction;Aquatic  Therapy;Paraffin;Passive range of motion;Balance training;Fluidtherapy;Cryotherapy;Therapist, nutritional;Therapeutic activities;Manual Therapy;Therapeutic exercise;Moist Heat;Neuromuscular education;Cognitive remediation/compensation    Plan plan to discharge next session    Consulted and Agree with Plan of Care Patient    Family Member Consulted discussed with pt's wife who was in waiting room           Patient will benefit from skilled therapeutic intervention in order to improve the following deficits and impairments:   Body Structure / Function / Physical Skills: ADL,UE functional use,Balance,FMC,GMC,Coordination,Decreased knowledge of use of DME,IADL,Dexterity,Strength,Mobility Cognitive Skills: Memory,Safety Awareness,Problem Solve,Sequencing,Thought,Understand,Attention     Visit Diagnosis: Muscle weakness (generalized)  Other lack of coordination  Attention and concentration deficit  Frontal lobe and executive function deficit  Unsteadiness on feet  Other abnormalities of gait and mobility    Problem List Patient Active Problem List   Diagnosis Date Noted  . AAA (abdominal aortic aneurysm) without rupture ([place]) 11/14/2020  . Subdural hematoma ([place]) 08/15/2020  . Iron deficiency anemia due to chronic blood loss 01/06/2019  . Orthostatic hypotension 01/05/2019    Class: Question of  . Normocytic normochromic anemia 01/05/2019  . Near syncope 01/05/2019  . Chronic respiratory failure with hypoxia ([place]) 06/30/2018  . OSA (obstructive sleep apnea)  02/11/2018  . Physical deconditioning 08/11/2017  . Shortness of breath 10/24/2014  . COPD (chronic obstructive pulmonary disease) with emphysema ([place]) 09/01/2014  . Fatigue 10/14/2011  . Atrial fibrillation ([place]) 05/15/2011  . Premature ventricular contractions (PVCs) ([facility]) 02/25/2011  . 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  . PERSISTENT DISORDER INITIATING/MAINTAINING SLEEP 05/10/2010  . HYPERCHOLESTEROLEMIA  05/09/2010  . GOUT 05/09/2010  . OBESITY 05/09/2010  . Obstructive sleep apnea 05/09/2010  . Essential hypertension 05/09/2010  . VENTRICULAR TACHYCARDIA 05/09/2010    Zachery Conch MOT, OTR/L  01/30/2021, 4:14 PM  [facility] [address] [place], [place], [address] Phone: [phone]   Fax:  [phone]  Name: [name] MRN: [id number] Date of Birth: 05-Sep-1934

## 2021-02-01 ENCOUNTER — Encounter: Payer: Self-pay | Admitting: Occupational Therapy

## 2021-02-01 ENCOUNTER — Other Ambulatory Visit: Payer: Self-pay

## 2021-02-01 ENCOUNTER — Ambulatory Visit: Payer: Medicare Other

## 2021-02-01 ENCOUNTER — Ambulatory Visit: Payer: Medicare Other | Admitting: Occupational Therapy

## 2021-02-01 DIAGNOSIS — Z9181 History of falling: Secondary | ICD-10-CM

## 2021-02-01 DIAGNOSIS — R41840 Attention and concentration deficit: Secondary | ICD-10-CM

## 2021-02-01 DIAGNOSIS — R2681 Unsteadiness on feet: Secondary | ICD-10-CM

## 2021-02-01 DIAGNOSIS — M6281 Muscle weakness (generalized): Secondary | ICD-10-CM

## 2021-02-01 DIAGNOSIS — R278 Other lack of coordination: Secondary | ICD-10-CM

## 2021-02-01 DIAGNOSIS — R41844 Frontal lobe and executive function deficit: Secondary | ICD-10-CM

## 2021-02-01 DIAGNOSIS — R2689 Other abnormalities of gait and mobility: Secondary | ICD-10-CM

## 2021-02-01 NOTE — Therapy (Signed)
[facility] [address] [place], [place], [address] Phone: [phone]   Fax:  [phone]  Occupational Therapy Treatment & Discharge  Patient Details  Name: [name] MRN: [id number] Date of Birth: 1933-11-17 Referring Provider (OT): Dr. Brett Fairy (referred by hospitalist with send to neurology)   Encounter Date: 02/01/2021   OT End of Session - 02/01/21 1503    Visit Number 19    Number of Visits 25    Date for OT Re-Evaluation 02/01/21    Authorization Type UHC Medicare    Authorization Time Period 90 days, anticipate d/c after 8 weeks .    Authorization - Visit Number 19    Authorization - Number of Visits 20    OT Start Time 1224    OT Stop Time 1537    OT Time Calculation (min) 46 min    Activity Tolerance Patient tolerated treatment well    Behavior During Therapy WFL for tasks assessed/performed           Past Medical History:  Diagnosis Date  . Afib ([place])   . Arthritis   . BPH (benign prostatic hyperplasia)   . COPD (chronic obstructive pulmonary disease) ([place])   . Coronary artery disease   . Coronary artery ectasia    Mild CAD with normal systolic function per cath in August of 2011  . Depression   . Diaphragmatic paralysis    felt to be partially responsible for dyspnea  . Dyspnea    due to paralyzed diaphragm and pulmonary issues  . Dysrhythmia   . GERD (gastroesophageal reflux disease)   . Gout   . HTN (hypertension)   . Hypercholesteremia   . Hypertension   . Iron deficiency anemia   . Microhematuria   . Obesity   . OSA (obstructive sleep apnea)    CPAP  . Paroxysmal atrial fibrillation (HCC)    occured in the setting of acute E Coli sepsis and ileius 7/12  . PVC (premature ventricular contraction)    s/p PVC ablation 05/29/2010    Past Surgical History:  Procedure Laterality Date  . APPENDECTOMY  1978  . BIOPSY  02/09/2019   Procedure: BIOPSY;  Surgeon: Clarene Essex, MD;   Location: [facility];  Service: Endoscopy;;  . BIOPSY  03/02/2019   Procedure: BIOPSY;  Surgeon: Clarene Essex, MD;  Location[facility];  Service: Endoscopy;;  . CARDIAC CATHETERIZATION  04-30-2010   Left main coronary artery is normal.   . CARDIOVASCULAR STRESS TEST  03-19-2010   0%  . CHOLECYSTECTOMY  1990  . COLONOSCOPY N/A 03/02/2019   Procedure: COLONOSCOPY;  Surgeon: Clarene Essex, MD;  [facility];  Service: Endoscopy;  Laterality: N/A;  . COLONOSCOPY W/ POLYPECTOMY    . ESOPHAGOGASTRODUODENOSCOPY (EGD) WITH PROPOFOL N/A 02/09/2019   Procedure: ESOPHAGOGASTRODUODENOSCOPY (EGD) WITH PROPOFOL;  Surgeon: Clarene Essex, MD;  [facility];  Service: Endoscopy;  Laterality: N/A;  . EYE SURGERY Right    catarct  . poly removed from nose as a child    . PVC ablation  05/29/2010  . RADIOLOGY WITH ANESTHESIA N/A 11/07/2020   Procedure: MRV HEAD WITH AND WITHOUT;  Surgeon: Radiologist, Medication, MD;  Location: [facility];  Service: Radiology;  Laterality: N/A;  . RADIOLOGY WITH ANESTHESIA N/A 11/07/2020   Procedure: CT LUMBAR SPINE WITHOUT;  Surgeon: Radiologist, Medication, MD;  Location: [facility];  Service: Radiology;  Laterality: N/A;  . US ECHOCARDIOGRAPHY  03-09-2010   EF 55-60%    There  were no vitals filed for this visit.   Subjective Assessment - 02/01/21 1453    Subjective  I'm hurtin. I fell twice last night. Pt denies hitting head with either of the falls.    Pertinent History Pt had a fall while shopping in [employer] on 08/15/20. Pt sustained a parafalcine and tentorial SDH. Was discharged from the hospital on 08/17/20.PMH:HTN, CAD, COPD, A-fib    Patient Stated Goals improve balance and strength    Currently in Pain? Yes    Pain Score 6     Pain Location Coccyx    Pain Orientation Lower    Pain Descriptors / Indicators Sore    Pain Type Acute pain    Pain Onset In the past 7 days    Pain Frequency Constant             Assessed goals. Pt met 5/6 STGs and 5/6 LTGs.  Discharge from OT.  Constant Therapy Alphabetizing Alternating Words (uppercase/lowercase) level 1 with 88% accuracy and 99.51s response time. From previous time: pt scored level 1 with 91% accuracy and 92.54s response time. Alternating Symbols level 5 with much improvement from last session however not able to finish and score d/t time constraint. Previous time: level 4 with 67% accuracy and 57.64s response time.          OCCUPATIONAL THERAPY DISCHARGE SUMMARY  Visits from Start of Care: 19  Current functional level related to goals / functional outcomes: Pt continues to be unsteady on feet impeding independence with ADLs and IADLs however has made some progress.   Remaining deficits: Pt continues to have decreased fine motor coordination and decreased independence with ADLs and IADLs d/t unsteadiness on feet.   Education / Equipment: HEPs for coordination and proximal strength  Plan: Patient agrees to discharge.  Patient goals were partially met. Patient is being discharged due to being pleased with the current functional level.  ?????                 OT Short Term Goals - 02/01/21 1458      OT SHORT TERM GOAL #1   Title Pt/ caregeiver will be I with HEP. 11/29/20    Time 4    Period Weeks    Status Achieved   met, for coordination HEP   Target Date 11/29/20      OT SHORT TERM GOAL #2   Title Pt / wife will verbalize understanding of strategies to increase safety and I with ADLS.    Time 4    Period Weeks    Status Achieved      OT SHORT TERM GOAL #3   Title Pt will perfrom transitional movments for ADLS with close supervision and no LOB.    Baseline histroy of falls    Time 4    Period Weeks    Status Achieved      OT SHORT TERM GOAL #4   Title Pt will demonstrate improved fine motor coordination in his LUE as eveidenced by decreasing 9-hole peg test score by 4 secs. UPDATE: complete in 35 seconds or less with LUE    Baseline 45.72 LUE at eval     Time 4    Period Weeks    Status Not Met   46.09s 02/01/21     OT SHORT TERM GOAL #5   Title Pt will sequence a simple functional or ADL task  with 90% or better accuracy .    Time 4    Period Weeks  Status Achieved   93% on constant therapy     OT SHORT TERM GOAL #6   Title Pt will demonstrate improved fine motor coordination for ADLs as evidenced by decreasing 3 button/ unbutton time to 90 secs or less.    Time 4    Period Weeks    Status Achieved   49.25            OT Long Term Goals - 02/01/21 1501      OT LONG TERM GOAL #1   Title Pt/ wife will be I with updated HEP for proximal strength.    Time 12    Period Weeks    Status Achieved      OT LONG TERM GOAL #2   Title Pt will demonstrate ability to perform simple beverage / snack prep modifeied indpendent demonstraing good saftey awareness.    Time 12    Period Weeks    Status Achieved   pt reports doing this at home     OT LONG TERM GOAL #3   Title Pt will perforrm bathing and  dressing mod I in a reasonable amout of time    Baseline increased time required    Time 12    Period Weeks    Status Achieved   per pt report     OT LONG TERM GOAL #4   Title Pt will demonstrate adequate bilateral UE strength to retrieve a lightweight object (3 lbs) from eye level  shelf without dropping with right and left UE's individually.    Time 12    Period Weeks    Status Achieved      OT LONG TERM GOAL #5   Title Pt will increase standing functional reach test score to 10 inches or greater with RUE to minimize fall risk during ADLs.    Time 12    Period Weeks    Status Achieved   10"     OT LONG TERM GOAL #6   Title Pt will perform physical and cognitive task with 90% accuracy    Time 12    Period Weeks    Status Not Met   80%                Plan - 02/01/21 1513    Clinical Impression Statement Pt is set to discharge this day. Some regression s/p multiple falls d/t increased unsteadiness on feet. Pt is  continuing to work with PT to address balance and BLE strength. Pt has met 5/6 STGs and 5/6 LTGs during his time with occupational therapy. Pt with some regression with fine motor coordination and overall balance in regards to self-care and ADLs.    OT Occupational Profile and History Detailed Assessment- Review of Records and additional review of physical, cognitive, psychosocial history related to current functional performance    Occupational performance deficits (Please refer to evaluation for details): ADL's;IADL's;Leisure;Social Participation    Body Structure / Function / Physical Skills ADL;UE functional use;Balance;FMC;GMC;Coordination;Decreased knowledge of use of DME;IADL;Dexterity;Strength;Mobility    Cognitive Skills Memory;Safety Awareness;Problem Solve;Sequencing;Thought;Understand;Attention    OT Frequency 2x / week    OT Duration 12 weeks   12 weeks total. renewal completed at week 5/visit 10 to address goals.   OT Treatment/Interventions Self-care/ADL training;Energy conservation;Patient/family education;DME and/or AE instruction;Aquatic Therapy;Paraffin;Passive range of motion;Balance training;Fluidtherapy;Cryotherapy;Therapist, nutritional;Therapeutic activities;Manual Therapy;Therapeutic exercise;Moist Heat;Neuromuscular education;Cognitive remediation/compensation    Plan OT discharge    Consulted and Agree with Plan of Care Patient    Family Member Consulted  discussed with pt's wife who was in waiting room           Patient will benefit from skilled therapeutic intervention in order to improve the following deficits and impairments:   Body Structure / Function / Physical Skills: ADL,UE functional use,Balance,FMC,GMC,Coordination,Decreased knowledge of use of DME,IADL,Dexterity,Strength,Mobility Cognitive Skills: Memory,Safety Awareness,Problem Solve,Sequencing,Thought,Understand,Attention     Visit Diagnosis: Muscle weakness (generalized)  Other lack of  coordination  Attention and concentration deficit  Frontal lobe and executive function deficit  Unsteadiness on feet    Problem List Patient Active Problem List   Diagnosis Date Noted  . AAA (abdominal aortic aneurysm) without rupture ([place]) 11/14/2020  . Subdural hematoma ([place]) 08/15/2020  . Iron deficiency anemia due to chronic blood loss 01/06/2019  . Orthostatic hypotension 01/05/2019    Class: Question of  . Normocytic normochromic anemia 01/05/2019  . Near syncope 01/05/2019  . Chronic respiratory failure with hypoxia ([place]) 06/30/2018  . OSA (obstructive sleep apnea) 02/11/2018  . Physical deconditioning 08/11/2017  . Shortness of breath 10/24/2014  . COPD (chronic obstructive pulmonary disease) with emphysema ([place]) 09/01/2014  . Fatigue 10/14/2011  . Atrial fibrillation ([place]) 05/15/2011  . Premature ventricular contractions (PVCs) ([facility]) 02/25/2011  . 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  . PERSISTENT DISORDER INITIATING/MAINTAINING SLEEP 05/10/2010  . HYPERCHOLESTEROLEMIA 05/09/2010  . GOUT 05/09/2010  . OBESITY 05/09/2010  . Obstructive sleep apnea 05/09/2010  . Essential hypertension 05/09/2010  . VENTRICULAR TACHYCARDIA 05/09/2010    Zachery Conch MOT, OTR/L  02/01/2021, 3:38 PM  [facility] [address] [place], [place], [address] Phone: [phone]   Fax:  [phone]  Name: WAEL MAESTAS MRN: [id number] Date of Birth: 1934/06/29

## 2021-02-01 NOTE — Therapy (Signed)
[facility] [address] [place], [place], [address] Phone: [phone]   Fax:  [phone]  Physical Therapy Treatment  Patient Details  Name: [name] MRN: [id number] Date of Birth: 20-Oct-1933 Referring Provider (PT): Dohmeier, Asencion Partridge, MD   Encounter Date: 02/01/2021   PT End of Session - 02/01/21 1614    Visit Number 22    Number of Visits 24    Date for PT Re-Evaluation 03/03/21    Authorization Type UHC Medicare    Progress Note Due on Visit 20    PT Start Time 1530    PT Stop Time 1615    PT Time Calculation (min) 45 min    Equipment Utilized During Treatment Gait belt    Activity Tolerance Patient tolerated treatment well;Patient limited by fatigue    Behavior During Therapy [facility] for tasks assessed/performed           Past Medical History:  Diagnosis Date  . Afib ([place])   . Arthritis   . BPH (benign prostatic hyperplasia)   . COPD (chronic obstructive pulmonary disease) ([place])   . Coronary artery disease   . Coronary artery ectasia    Mild CAD with normal systolic function per cath in August of 2011  . Depression   . Diaphragmatic paralysis    felt to be partially responsible for dyspnea  . Dyspnea    due to paralyzed diaphragm and pulmonary issues  . Dysrhythmia   . GERD (gastroesophageal reflux disease)   . Gout   . HTN (hypertension)   . Hypercholesteremia   . Hypertension   . Iron deficiency anemia   . Microhematuria   . Obesity   . OSA (obstructive sleep apnea)    CPAP  . Paroxysmal atrial fibrillation (HCC)    occured in the setting of acute E Coli sepsis and ileius 7/12  . PVC (premature ventricular contraction)    s/p PVC ablation 05/29/2010    Past Surgical History:  Procedure Laterality Date  . APPENDECTOMY  1978  . BIOPSY  02/09/2019   Procedure: BIOPSY;  Surgeon: Clarene Essex, MD;  [facility];  Service: Endoscopy;;  . BIOPSY  03/02/2019   Procedure: BIOPSY;   Surgeon: Clarene Essex, MD;  [facility];  Service: Endoscopy;;  . CARDIAC CATHETERIZATION  04-30-2010   Left main coronary artery is normal.   . CARDIOVASCULAR STRESS TEST  03-19-2010   0%  . CHOLECYSTECTOMY  1990  . COLONOSCOPY N/A 03/02/2019   Procedure: COLONOSCOPY;  Surgeon: Clarene Essex, MD;  [facility];  Service: Endoscopy;  Laterality: N/A;  . COLONOSCOPY W/ POLYPECTOMY    . ESOPHAGOGASTRODUODENOSCOPY (EGD) WITH PROPOFOL N/A 02/09/2019   Procedure: ESOPHAGOGASTRODUODENOSCOPY (EGD) WITH PROPOFOL;  Surgeon: Clarene Essex, MD;  [facility];  Service: Endoscopy;  Laterality: N/A;  . EYE SURGERY Right    catarct  . poly removed from nose as a child    . PVC ablation  05/29/2010  . RADIOLOGY WITH ANESTHESIA N/A 11/07/2020   Procedure: MRV HEAD WITH AND WITHOUT;  Surgeon: Radiologist, Medication, MD;  Location: [facility];  Service: Radiology;  Laterality: N/A;  . RADIOLOGY WITH ANESTHESIA N/A 11/07/2020   Procedure: CT LUMBAR SPINE WITHOUT;  Surgeon: Radiologist, Medication, MD;  Location: [facility];  Service: Radiology;  Laterality: N/A;  . US ECHOCARDIOGRAPHY  03-09-2010   EF 55-60%    There were no vitals filed for this visit.   Subjective Assessment - 02/01/21 1539    Subjective  Incurred 2 more falls last night, first one stepping bwd and tripping over reacher, second when he was walking bwds and lost his balance due to anxiety following his first fall    Patient is accompained by: Family member   wife, [name]   Pertinent History HTN, CAD, COPD, A-fib    Limitations Standing;Walking    How long can you sit comfortably? 30'    How long can you stand comfortably? 95'    How long can you walk comfortably? 44'    Patient Stated Goals wants to improve his balance    Pain Onset In the past 7 days                             [facility] Adult PT Treatment/Exercise - 02/01/21 0001      Transfers   Transfers Sit to Stand    Comments 5x with arms crossed to  promote WS and confidence      Ambulation/Gait   Ambulation/Gait Yes    Ambulation/Gait Assistance 4: Min guard    Ambulation Distance (Feet) 230 Feet    Assistive device 4-wheeled walker    Gait Pattern Step-through pattern    Gait Comments less tense and more upright following prior core/trunk tasks      Lumbar Exercises: Seated   Other Seated Lumbar Exercises core exercises of hip tosses, shoulder tosses, chops and victories followed by latisimus press downs, all 10 reps using 1.1# ball          Discussed fall prevention with patient in an effort to determine cause of 2 falls last night and establish strategies to prevent re-occurance            PT Short Term Goals - 11/28/20 1455      PT SHORT TERM GOAL #1   Title Pt will initiate HEP in order to indicate improved functional mobility and decreased fall risk.  ALL STGS DUE 11/21/20    Baseline reviewed HEP on 11/28/20    Time 4    Period Weeks    Status Achieved    Target Date 11/21/20      PT SHORT TERM GOAL #2   Title Pt will perform 5x sit <> stand in 17 seconds or less with BUE support in order to demo improved functional strength.    Baseline 19.31 seconds, 10.97 seconds with BUE support on 11/28/20    Time 4    Period Weeks    Status Achieved      PT SHORT TERM GOAL #3   Title Pt will undergo BERG with STG and LTG written as appropriate.    Baseline 10/27/20 BERG 37/56    Time 4    Period Weeks    Status Achieved      PT SHORT TERM GOAL #4   Title Pt will improve gait speed with rollator to at least 2.5 ft/sec in order demo improved community mobility.    Baseline 2.18 ft/sec, 14.41 seconds = 2.27 ft/sec on 3/22    Time 4    Period Weeks    Status Not Met      PT SHORT TERM GOAL #5   Title Pt and pt's spouse will verbalize understanding of fall prevention in the home to decr fall risk.    Time 4    Period Weeks    Status New             PT Long Term Goals -  02/01/21 1819      PT LONG TERM GOAL  #1   Title Pt will be independent with final HEP in order to indicate improved functional mobility and decreased fall risk.  ALL LTGS DUE 01/30/21    Baseline updated on 01/02/21 - will benefit from on going additions 02/01/21 Reports compliance with current HEP as he has set a timer 2x/day    Time 4    Period Weeks    Status On-going      PT LONG TERM GOAL #2   Title Pt to improve BERG score to at least a 44/56 in order to demo decr fall risk.    Baseline 37; 41/56 on 12/19/20; 01/16/21 BERG score 44; 02/01/21 Deferred testing due to recent falls    Time 4    Period Weeks    Status On-going      PT LONG TERM GOAL #3   Title Pt will improve gait speed to at least 2.1 ft/sec in order to demo improved gait efficiency.    Baseline 1.92 ft/sec with SPC with quad tip; 01/16/21 gait velocity 1.76f/s; 02/01/21 Deferred testing due to recent falls    Time 4    Period Weeks    Status On-going      PT LONG TERM GOAL #4   Title Pt will ambulate at least 230' over indoor surfaces with cane vs. LRAD with supervision in order to demo improved household mobility.    Baseline supervision/min guard with SPC with quad tip on 12/19/20; 01/16/21 Ambulation of 2315facross level ground with SPC and SBA; 02/01/21 Light CGA for 23064fmbulation with rollator    Time 4    Period Weeks    Status On-going      PT LONG TERM GOAL #5   Title Pt will perform TUG in 13.5 seconds or less with SPC with quad tip in order to demo decr fall risk.    Baseline 14.5 seconds with SPC with 4 prong tip; 01/16/21 TUG score 14.67s performed with small tip SPC; 02/01/21 Deferred testing due to recent falls    Time 4    Period Weeks    Status On-going                 Plan - 02/01/21 1556    Clinical Impression Statement Initiated seated core exerises to promote sitting balance, trunk/core strengthening and seated balance challenges. balnce deficits evident in seated position with patient still apprehensive about standing balance  tasks due to 2 recent falls last night.  Too apprehensive and guarding to accureately assess gunctional gait and balance testing.  Gait more fluid and less tense and apprehensive following core exercises    Personal Factors and Comorbidities Comorbidity 3+;Past/Current Experience    Comorbidities HTN, CAD, COPD, A-fib, hx of COVID march 2021    Examination-Activity Limitations Stand;Squat;Transfers;Reach Overhead;Locomotion Level    Examination-Participation Restrictions Community Activity;Driving    Stability/Clinical Decision Making Evolving/Moderate complexity    Rehab Potential Good    PT Frequency 2x / week    PT Duration 8 weeks    PT Treatment/Interventions ADLs/Self Care Home Management;DME Instruction;Gait training;Functional mobility training;Stair training;Neuromuscular re-education;Balance training;Therapeutic exercise;Therapeutic activities;Patient/family education;Vestibular;Energy conservation    PT Next Visit Plan Perform functional balance testing when patient confidence returns, has 2 remaining visits brefore end of certification 02/26/98/83 PT Home Exercise Plan [id number] Consulted and Agree with Plan of Care Patient           Patient will  benefit from skilled therapeutic intervention in order to improve the following deficits and impairments:  Abnormal gait,Decreased activity tolerance,Decreased balance,Decreased endurance,Decreased coordination,Decreased cognition,Decreased knowledge of use of DME,Decreased range of motion,Difficulty walking,Decreased safety awareness,Decreased strength  Visit Diagnosis: Muscle weakness (generalized)  Unsteadiness on feet  Other abnormalities of gait and mobility  History of falling     Problem List Patient Active Problem List   Diagnosis Date Noted  . AAA (abdominal aortic aneurysm) without rupture ([place]) 11/14/2020  . Subdural hematoma ([place]) 08/15/2020  . Iron deficiency anemia due to chronic blood loss 01/06/2019  .  Orthostatic hypotension 01/05/2019    Class: Question of  . Normocytic normochromic anemia 01/05/2019  . Near syncope 01/05/2019  . Chronic respiratory failure with hypoxia ([place]) 06/30/2018  . OSA (obstructive sleep apnea) 02/11/2018  . Physical deconditioning 08/11/2017  . Shortness of breath 10/24/2014  . COPD (chronic obstructive pulmonary disease) with emphysema ([place]) 09/01/2014  . Fatigue 10/14/2011  . Atrial fibrillation ([place]) 05/15/2011  . Premature ventricular contractions (PVCs) ([facility]) 02/25/2011  . 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  . PERSISTENT DISORDER INITIATING/MAINTAINING SLEEP 05/10/2010  . HYPERCHOLESTEROLEMIA 05/09/2010  . GOUT 05/09/2010  . OBESITY 05/09/2010  . Obstructive sleep apnea 05/09/2010  . Essential hypertension 05/09/2010  . VENTRICULAR TACHYCARDIA 05/09/2010    Lanice Shirts PT 02/01/2021, 6:22 PM  [facility] [address] [place], [place], [address] Phone: [phone]   Fax:  [phone]  Name: [name] MRN: [id number] Date of Birth: August 03, 1934

## 2021-02-13 ENCOUNTER — Ambulatory Visit: Payer: Medicare Other | Attending: Physician Assistant | Admitting: Physical Therapy

## 2021-02-13 ENCOUNTER — Other Ambulatory Visit: Payer: Self-pay

## 2021-02-13 ENCOUNTER — Encounter: Payer: Self-pay | Admitting: Physical Therapy

## 2021-02-13 DIAGNOSIS — R2681 Unsteadiness on feet: Secondary | ICD-10-CM | POA: Insufficient documentation

## 2021-02-13 DIAGNOSIS — R2689 Other abnormalities of gait and mobility: Secondary | ICD-10-CM | POA: Insufficient documentation

## 2021-02-13 DIAGNOSIS — M6281 Muscle weakness (generalized): Secondary | ICD-10-CM | POA: Insufficient documentation

## 2021-02-13 DIAGNOSIS — Z9181 History of falling: Secondary | ICD-10-CM | POA: Insufficient documentation

## 2021-02-14 ENCOUNTER — Ambulatory Visit: Payer: Medicare Other | Admitting: Neurology

## 2021-02-14 ENCOUNTER — Encounter: Payer: Self-pay | Admitting: Neurology

## 2021-02-14 VITALS — BP 157/96 | HR 78 | Ht 70.0 in | Wt 192.0 lb

## 2021-02-14 DIAGNOSIS — Z9181 History of falling: Secondary | ICD-10-CM | POA: Diagnosis not present

## 2021-02-14 DIAGNOSIS — G629 Polyneuropathy, unspecified: Secondary | ICD-10-CM | POA: Diagnosis not present

## 2021-02-14 DIAGNOSIS — M6258 Muscle wasting and atrophy, not elsewhere classified, other site: Secondary | ICD-10-CM | POA: Diagnosis not present

## 2021-02-14 DIAGNOSIS — I4891 Unspecified atrial fibrillation: Secondary | ICD-10-CM

## 2021-02-14 NOTE — Progress Notes (Signed)
SLEEP MEDICINE CLINIC    Provider:  Larey Seat, MD  Primary Care Physician:  [name], [place] [address]     Referring Provider: [name], [place] [address] [place],  [place] [address]          Chief Complaint according to patient   Patient presents with:    . New Patient (Initial Visit)           HISTORY OF PRESENT ILLNESS:  [name] is a 87 y.o. year old White or Caucasian male patient seen on 02/14/2021 .  RV  02-14-2021, Here in Rm 10 for follow-up with wife. Patient has continued to fall. In the last 2 weeks he has fallen 3 times. He fell in the kitchen around May 16th and hit his head on the floor in the kitchen. He went to the ER and had a CT done. He reports occasionally when he lies down at night he hears music that just "appears" in his head, like a quartet at a barbershop. He said one time it got in his way while he was trying to watch TV but overall it does not bother him.  His blood pressure has been normal when taken at home, but he has this feeling of being pulled backwards or he falls rather suddenly and usually backwards and often has hit his head.  These falls have been much more frequent and have at least once led to a subdural hematoma.  About Saturday just before [facility] he also fell backwards after doing some exercises for which she holds onto a piece of furniture in front of him, and yet he fell backwards and did not have any warning that this would happen.  [name] is an 87 year old gentleman with a history of prior lumbosacral spine surgery and his deterioration in walking or ambulation has been going on for over half a year.  He has needed to use a walker for stability and he does still have some weakness dominantly on the right side more than left. PT has worked with him and he is still on course.    EMG NCV were indicative of radiculopathy and neuropathy affecting the right leg, MRV was normal,  MRI  spine L showed foraminal stenosis. He was deg hydrated based on his high BUN in CMET.      Chief concern according to patient : " I can't walk and I fall."  [name] has been walking with a cane for about 3 years and over the last 6 month had to switch to a walker, he has had frequent falls since and suffered a bleed.  He has atrial fib. These falls let to his anticoagulation and metoprolol to be removed by cardiologist 14 days ago. He has new appointment with Dr [name] tomorrow.  He is followed by pulmonology for COPD and pulmonology will address sleep apnea. The frequency of falls has increased and especially over the last 6 months.  On  December 7th,  he had to be hospitalized after a fall at [employer], and a brain bleed was found, a SDH.  since then repeated falls very 10-14 days.     [name] or Caucasian male who  has a past medical history of Afib ([place]), Arthritis, BPH (benign prostatic hyperplasia), COPD (chronic obstructive pulmonary disease) ([place]), Coronary artery disease, Coronary artery ectasia, Depression, Diaphragmatic paralysis, Dyspnea, Dysrhythmia, GERD (gastroesophageal reflux disease), Gout, HTN (hypertension), Hypercholesteremia, Hypertension, Iron deficiency  anemia, Microhematuria, Obesity, OSA (obstructive sleep apnea), Paroxysmal atrial fibrillation ([place]), and PVC (premature ventricular contraction). COVID 19 in March 2021, after 2 vaccines, is now boostered.  ED , not hospitalized at [facility].      Social history:  Patient is  retired from Social worker and lives in a household with spouse,  No pets. Tobacco use; former smoker, quit 1885.   ETOH use: scotch 2 drinks every day , or more.  Caffeine intake in form of Coffee( /) Soda( /) Tea (/) or energy drinks. Regular exercise in form of walking.  .    I do not think a neurosurgical consult is necessary there may be reason to see a neurosurgeon for his spine as he walks with a slightly forward  leaning gait and he has trouble to raise from a chair from a seated position and uses both arms to brace himself and there is obviously very little core strength and rather smallish muscles of both the thigh and the gastrocnemius and the right leg.   There is also a tremor noted this is an action tremor not addressed it is not parkinsonian and in addition there is a complete loss of deep tendon reflexes noted.  So altogether I think there is a progressive weakness.   There is most likely some neuropathic findings.  There is also asymmetry of the muscles with the right lower extremity being smaller than the left and traditionally that has not been this way if not all his life and known factor so I do wonder if he either had a back related problem air if this is a stroke but was so far not worked up.  As to his declining memory and his declining exercise tolerance this may be well related to paroxysmal atrial fibrillation the chronic obstructive pulmonary disease, urinary frequency with Nocturia, possibly OSA,  gout but I am also thinking that we need to look at an MRI of the brain if he can. History of anemia with b 12 and iron deficiency and near syncope .  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  TBI with Dementia.  His breathing has worsened after COVID infection .        Review of Systems: Out of a complete 14 system review, the patient complains of only the following symptoms, and all other reviewed systems are negative.:   Falls, poor balance.   Severely depressed by GDS 12 out of 15 points - severe.   Dementia.   Social History   Socioeconomic History  . Marital status: Married    Spouse name: Not on file  . Number of children: Not on file  . Years of education: Not on file  . Highest education level: Not on file  Occupational History  . Occupation: Retired  Tobacco Use  . Smoking status: Former Smoker    Types: Cigarettes    Quit date: 1985    Years since quitting: 37.4  .  Smokeless tobacco: Never Used  Vaping Use  . Vaping Use: Never used  Substance and Sexual Activity  . Alcohol use: Yes    Alcohol/week: 7.0 standard drinks    Types: 7 Standard drinks or equivalent per week    Comment: 1 drink per day   . Drug use: Never  . Sexual activity: Yes  Other Topics Concern  . Not on file  Social History Narrative   Lives at home with wife   Right handed   Caffeine: tea, 1 cup/day       **  Merged History Encounter **       Social Determinants of Health   Financial Resource Strain: Not on file  Food Insecurity: Not on file  Transportation Needs: Not on file  Physical Activity: Not on file  Stress: Not on file  Social Connections: Not on file    Family History  Problem Relation Age of Onset  . Aneurysm Father 63       brain aneurysm  . Breast cancer Mother   . Breast cancer Daughter     Past Medical History:  Diagnosis Date  . Afib ([place])   . Arthritis   . BPH (benign prostatic hyperplasia)   . COPD (chronic obstructive pulmonary disease) ([place])   . Coronary artery disease   . Coronary artery ectasia    Mild CAD with normal systolic function per cath in August of 2011  . Depression   . Diaphragmatic paralysis    felt to be partially responsible for dyspnea  . Dyspnea    due to paralyzed diaphragm and pulmonary issues  . Dysrhythmia   . GERD (gastroesophageal reflux disease)   . Gout   . HTN (hypertension)   . Hypercholesteremia   . Hypertension   . Iron deficiency anemia   . Microhematuria   . Obesity   . OSA (obstructive sleep apnea)    CPAP  . Paroxysmal atrial fibrillation ([facility])    occured in the setting of acute E Coli sepsis and ileius 7/12  . PVC (premature ventricular contraction)    s/p PVC ablation 05/29/2010    Past Surgical History:  Procedure Laterality Date  . APPENDECTOMY  1978  . BIOPSY  02/09/2019   Procedure: BIOPSY;  Surgeon: Clarene Essex, MD;  Location: [facility];  Service: Endoscopy;;  . BIOPSY   03/02/2019   Procedure: BIOPSY;  Surgeon: Clarene Essex, MD;  Location: [facility];  Service: Endoscopy;;  . CARDIAC CATHETERIZATION  04-30-2010   Left main coronary artery is normal.   . CARDIOVASCULAR STRESS TEST  03-19-2010   0%  . CHOLECYSTECTOMY  1990  . COLONOSCOPY N/A 03/02/2019   Procedure: COLONOSCOPY;  Surgeon: Clarene Essex, MD;  Location: [facility];  Service: Endoscopy;  Laterality: N/A;  . COLONOSCOPY W/ POLYPECTOMY    . ESOPHAGOGASTRODUODENOSCOPY (EGD) WITH PROPOFOL N/A 02/09/2019   Procedure: ESOPHAGOGASTRODUODENOSCOPY (EGD) WITH PROPOFOL;  Surgeon: Clarene Essex, MD;  Location: [facility];  Service: Endoscopy;  Laterality: N/A;  . EYE SURGERY Right    catarct  . poly removed from nose as a child    . PVC ablation  05/29/2010  . RADIOLOGY WITH ANESTHESIA N/A 11/07/2020   Procedure: MRV HEAD WITH AND WITHOUT;  Surgeon: Radiologist, Medication, MD;  Location: [facility];  Service: Radiology;  Laterality: N/A;  . RADIOLOGY WITH ANESTHESIA N/A 11/07/2020   Procedure: CT LUMBAR SPINE WITHOUT;  Surgeon: Radiologist, Medication, MD;  Location: [facility];  Service: Radiology;  Laterality: N/A;  . US ECHOCARDIOGRAPHY  03-09-2010   EF 55-60%     Current Outpatient Medications on File Prior to Visit  Medication Sig Dispense Refill  . acetaminophen (TYLENOL) 325 MG tablet Take 2 tablets (650 mg total) by mouth every 6 (six) hours as needed for mild pain.    Marland Kitchen allopurinol (ZYLOPRIM) 300 MG tablet Take 300 mg by mouth daily.    [name] 62.5-25 MCG/INH AEPB Inhale 1 puff into the lungs daily.    Marland Kitchen buPROPion (WELLBUTRIN SR) 100 MG 12 hr tablet Take 100 mg by mouth 2 (two) times  daily.     . doxazosin (CARDURA) 4 MG tablet Take 0.5 tablets (2 mg total) by mouth at bedtime.    . ferrous sulfate 325 (65 FE) MG tablet Take 325 mg by mouth daily with breakfast.    . hydrALAZINE (APRESOLINE) 25 MG tablet Take 1 tablet (25 mg total) by mouth as needed (If pressure is 160/95 take one tablet.). 30 tablet 1   . hydrochlorothiazide (HYDRODIURIL) 25 MG tablet Take 25 mg by mouth daily.    Marland Kitchen lovastatin (MEVACOR) 40 MG tablet Take 40 mg by mouth daily.    . OXYGEN Inhale 2 L/min into the lungs daily as needed (SOB).     . pantoprazole (PROTONIX) 40 MG tablet Take 40 mg by mouth daily.    . sertraline (ZOLOFT) 100 MG tablet Take 100 mg by mouth daily.    . traZODone (DESYREL) 100 MG tablet Take 100 mg by mouth at bedtime.     . vitamin B-12 (CYANOCOBALAMIN) 500 MCG tablet Take 500 mcg by mouth daily.     No current facility-administered medications on file prior to visit.    No Known Allergies  Physical exam:  Today's Vitals   02/14/21 1534  BP: (!) 157/96  Pulse: 78  Weight: 192 lb (87.1 kg)  Height: 5\' 10"  (1.778 m)   Body mass index is 27.55 kg/m.   Wt Readings from Last 3 Encounters:  02/14/21 192 lb (87.1 kg)  01/22/21 193 lb (87.5 kg)  11/14/20 194 lb 12.8 oz (88.4 kg)     Ht Readings from Last 3 Encounters:  02/14/21 5\' 10"  (1.778 m)  01/22/21 5\' 10"  (1.778 m)  11/14/20 5\' 10"  (1.778 m)      General: The patient is awake, alert and appears not in acute distress. The patient is well groomed. Head: Normocephalic, atraumatic. Neck is supple. Mallampati 3,  neck circumference: 16 inches . Nasal airflow  patent. Dental status: biological.  Cardiovascular:  Regular rate and cardiac rhythm by pulse,  without distended neck veins. Respiratory: Lungs are clear to auscultation.  Skin:  With  evidence of mild ankle edema. Trunk: The patient's posture is erect.   Neurologic exam : The patient is awake and alert, oriented to place and time.   Memory subjective described as impaired - forgets password, pin number.  Attention span & concentration ability appears normal.  Speech is non  fluent,  with dysarthria, dysphonia and  aphasia.  Mood and affect are appropriate.   Cranial nerves: no loss of smell or taste reported  Pupils are equal and briskly reactive to light. Funduscopic  exam deferred. .  Extraocular movements in vertical and horizontal planes were intact and without nystagmus. No Diplopia. Visual fields by finger perimetry are intact. Hearing was impaired.   Facial sensation intact to fine touch.  Facial motor strength is symmetric and tongue and uvula move midline.  Neck ROM : rotation, tilt and flexion extension were normal for age and shoulder shrug was symmetrical.    Motor exam:  he lost muscle  mass on the lower right extremity , small than left.  Pronator drift on both sides.  Normal tone without cog wheeling, symmetric grip strength .   Sensory:   vibration sense reportedly  normal.  Proprioception tested in the upper extremities was impaired    Coordination: Rapid alternating movements in the fingers/hands were of normal speed.  The Finger-to-nose maneuver was impaired  with evidence of ataxia, and action-tremor. Pronator drift on the left.  Gait and station: Patient could not rise unassisted from a seated position, walked with a walker toe and heel walk were deferred.  Standing with eyes closed he drifted to the right.   Deep tendon reflexes: in the  upper and lower extremities are attenuated. Absent  Babinski response was deferred.      I reviewed the CT and other images with the patient, there is significant brain atrophy and intra-falcine Calcification, left sylvian fissure is enlarged , and his cardiology referral.   The cause of the patient[place] [employer] was unclear  - he pushed a cart, when noting his legs felt "dead" and he believes he did not pass out. He hit his head hard, it bled and someone came to compress the wound.  His hands were not bloody - so I believe he did loose conscienceness. He had driven himself - and had not driven since. He gets lost now.     After spending a total time of  60 minutes face to face and additional time for physical and neurologic examination, review of laboratory studies,  personal review of  imaging studies, reports and results of other testing and review of referral information / records as far as provided in visit, I have established the following assessments:  1) [name] suffered multiple falls in the progressive more frequent manner over the last 18 months about.  Since December he has had at least 3 severe falls about 14 to 10 days apart each.  The [place] that led to his hospitalization [place] [employer].  The patient was pushing a strapping cart and good bleeding knowledge of course but he felt that his knees were slowly buckling that he could not feel his legs I do not know how much morning there was however he did finally fall and he fell apparently backwards as he hit the high parietal scalp.  A subdural hemorrhage was caused by this fall and he had 9 stitches to treat the peripheral hematoma.  I also reviewed the images today it does not look like that of subdural hematoma has ever enlarged and my reason for reviewing is that the patient was recently asked that he may need to see a neurosurgeon as to his brain status.   My Plan is to proceed with: The patient's nerve conduction study and EMG have shown a neuropathy and radiculopathy affecting the right lower extremity H8-I6, there is certainly weakness related to that finding and doing his exercises he is not able to maintain his own body weight standing on the right leg only.  So we have to find an alternative way to exercise for example while seated lifting against gravity.  He also has experienced variable blood pressures and presented with a high BUN which I related to dehydration.  I think he is not sufficiently hydrated and that will help him to maintain a standing posture without lightheadedness.  It is concerning that some of his falls seem to have had almost no warning he just fell backwards and I am not sure that a mechanical cause was always present.  So there may be a parkinsonian component given that he has mild tremors  but even that could improve with sufficient hydration.  A walker or Rollator unfortunately does not prevent the patient from falling backwards or forwards.me So I wonder if he needs to use it with a break or with a tennis ball instead of was a four-wheel scooter.  This way he cannot get ahead  of himself. PT may be helpful in evaluating this.   HYDRATION, exercises while seated, neuropathy recovery.     I would like to thank [name], MD , [address] [place],  [place] [id number] for allowing me to meet with and to take care of this pleasant patient.   In short, [name] is presenting with a multifactorial gait disorder and muscle atrophy, L4-5 S1.  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   I plan to follow up as needed,   CC: I will share my notes with PCP.   Electronically signed by: Larey Seat, MD 02/14/2021 3:56 PM  [facility] and [facility] Sleep Board certified by The [employer] of Sleep Medicine and Diplomate of the [employer] of Sleep Medicine. Board certified In Neurology through the [employer], Fellow of the [employer] of Neurology. Medical Director of [employer].

## 2021-02-14 NOTE — Therapy (Signed)
[facility] [address] [place], [place], [address] Phone: [phone]   Fax:  [phone]  Physical Therapy Treatment/Re-Cert  Patient Details  Name: [name] MRN: [id number] Date of Birth: [birth year] Referring Provider (PT): Dohmeier, Asencion Partridge, MD   Encounter Date: 02/13/2021   PT End of Session - 02/14/21 0856    Visit Number 23    Number of Visits 30    Date for PT Re-Evaluation 43/32/95   per re-cert on 09/16/82 for 4 weeks   Authorization Type [employer] Medicare    Progress Note Due on Visit 20    PT Start Time 1233    PT Stop Time 1315    PT Time Calculation (min) 42 min    Equipment Utilized During Treatment Gait belt    Activity Tolerance Patient tolerated treatment well;Patient limited by fatigue    Behavior During Therapy WFL for tasks assessed/performed           Past Medical History:  Diagnosis Date  . Afib ([place])   . Arthritis   . BPH (benign prostatic hyperplasia)   . COPD (chronic obstructive pulmonary disease) ([place])   . Coronary artery disease   . Coronary artery ectasia    Mild CAD with normal systolic function per cath in August of 2011  . Depression   . Diaphragmatic paralysis    felt to be partially responsible for dyspnea  . Dyspnea    due to paralyzed diaphragm and pulmonary issues  . Dysrhythmia   . GERD (gastroesophageal reflux disease)   . Gout   . HTN (hypertension)   . Hypercholesteremia   . Hypertension   . Iron deficiency anemia   . Microhematuria   . Obesity   . OSA (obstructive sleep apnea)    CPAP  . Paroxysmal atrial fibrillation (H[facility])    occured in the setting of acute E Coli sepsis and ileius 7/12  . PVC (premature ventricular contraction)    s/p PVC ablation 05/29/2010    Past Surgical History:  Procedure Laterality Date  . APPENDECTOMY  1978  . BIOPSY  02/09/2019   Procedure: BIOPSY;  Surgeon: Clarene Essex, MD;  Location: [facility];  Service: Endoscopy;;  .  BIOPSY  03/02/2019   Procedure: BIOPSY;  Surgeon: Clarene Essex, MD;  Location: [facility];  Service: Endoscopy;;  . CARDIAC CATHETERIZATION  04-30-2010   Left main coronary artery is normal.   . CARDIOVASCULAR STRESS TEST  03-19-2010   0%  . CHOLECYSTECTOMY  1990  . COLONOSCOPY N/A 03/02/2019   Procedure: COLONOSCOPY;  Surgeon: Clarene Essex, MD;  Location: [facility];  Service: Endoscopy;  Laterality: N/A;  . COLONOSCOPY W/ POLYPECTOMY    . ESOPHAGOGASTRODUODENOSCOPY (EGD) WITH PROPOFOL N/A 02/09/2019   Procedure: ESOPHAGOGASTRODUODENOSCOPY (EGD) WITH PROPOFOL;  Surgeon: Clarene Essex, MD;  Location: [facility];  Service: Endoscopy;  Laterality: N/A;  . EYE SURGERY Right    catarct  . poly removed from nose as a child    . PVC ablation  05/29/2010  . RADIOLOGY WITH ANESTHESIA N/A 11/07/2020   Procedure: MRV HEAD WITH AND WITHOUT;  Surgeon: Radiologist, Medication, MD;  Location: [facility];  Service: Radiology;  Laterality: N/A;  . RADIOLOGY WITH ANESTHESIA N/A 11/07/2020   Procedure: CT LUMBAR SPINE WITHOUT;  Surgeon: Radiologist, Medication, MD;  Location: [facility];  Service: Radiology;  Laterality: N/A;  . US ECHOCARDIOGRAPHY  03-09-2010   EF 55-60%    There were no vitals filed for this visit.   Subjective  Assessment - 02/13/21 1235    Subjective No other falls since he was last here. Using the rollator at all times now except for upstairs. Sees Dr. Brett Fairy tomorrow.    Patient is accompained by: Family member   wife, [name]   Pertinent History HTN, CAD, COPD, A-fib    Limitations Standing;Walking    How long can you sit comfortably? 78'    How long can you stand comfortably? 51'    How long can you walk comfortably? 19'    Patient Stated Goals wants to improve his balance    Currently in Pain? No/denies    Pain Onset In the past 7 days              [facility] PT Assessment - 02/13/21 1247      Assessment   Medical Diagnosis Subdural hematoma    Referring Provider (PT) Dohmeier, Asencion Partridge, MD     Onset Date/Surgical Date 08/15/20      Prior Function   Level of Independence Independent      Berg Balance Test   Sit to Stand Able to stand without using hands and stabilize independently    Standing Unsupported Able to stand safely 2 minutes    Sitting with Back Unsupported but Feet Supported on Floor or Stool Able to sit safely and securely 2 minutes    Stand to Sit Sits safely with minimal use of hands    Transfers Able to transfer safely, minor use of hands    Standing Unsupported with Eyes Closed Able to stand 10 seconds safely    Standing Unsupported with Feet Together Able to place feet together independently and stand for 1 minute with supervision    From Standing, Reach Forward with Outstretched Arm Can reach confidently >25 cm (10")    From Standing Position, Pick up Object from Floor Able to pick up shoe, needs supervision    From Standing Position, Turn to Look Behind Over each Shoulder Looks behind one side only/other side shows less weight shift    Turn 360 Degrees Able to turn 360 degrees safely but slowly   8.41 to L, 6.28 to R   Standing Unsupported, Alternately Place Feet on Step/Stool Needs assistance to keep from falling or unable to try    Standing Unsupported, One Foot in Front Able to take small step independently and hold 30 seconds    Standing on One Leg Tries to lift leg/unable to hold 3 seconds but remains standing independently    Total Score 42    Berg comment: significant risk of falls      Timed Up and Go Test   Normal TUG (seconds) 12.7    TUG Comments SPC w/ quad tip - min guard going around turn                         [facility] Adult PT Treatment/Exercise - 02/13/21 1247      Ambulation/Gait   Ambulation/Gait Yes    Ambulation/Gait Assistance 5: Supervision;4: Min guard    Ambulation/Gait Assistance Details pt ambulated into session with rollator, used SPC with 4 prong tip    Ambulation Distance (Feet) 115 Feet   x1 with cane, plus  additional clinic distances   Assistive device 4-wheeled walker;Straight cane   with 4 prong tip   Gait Pattern Step-through pattern    Ambulation Surface Level;Indoor    Gait velocity 14.59 seconds = 2.25 ft/sec on 02/13/21 with SPC with quad tip.  Gait Comments discussed continued use of rollator for safety at all times (except for upstairs when it can't fit and pt needs to use cane) due to continued fall risk and pt's hx of falls                  PT Education - 02/14/21 0801    Education Details progress towards goals, re-cert for an additional 4 weeks, pt's wife to come back next session to review/update HEP as appropriate as well as walking program with rollator.    Person(s) Educated Patient;Spouse    Methods Explanation    Comprehension Verbalized understanding            PT Short Term Goals - 02/13/21 1238      PT SHORT TERM GOAL #1   Title ALL STGS = LTGS             PT Long Term Goals - 02/14/21 0913      PT LONG TERM GOAL #1   Title Pt will be independent with final HEP in order to indicate improved functional mobility and decreased fall risk.  ALL LTGS DUE 03/14/21    Baseline review and update as needed.    Time 4    Period Weeks    Status On-going    Target Date 03/14/21      PT LONG TERM GOAL #2   Title Pt to improve BERG score to at least a 45/56 in order to demo decr fall risk.    Baseline 01/16/21 BERG score 44; 02/13/21 - 42/56    Time 4    Period Weeks    Status Revised      PT LONG TERM GOAL #3   Title Pt will maintain gait speed to 2.2 ft/sec with SPC  in order to demo improved mobility.    Baseline 14.59 seconds = 2.25 ft/sec on 02/13/21 with SPC and quad tip    Time 4    Period Weeks    Status New      PT LONG TERM GOAL #4   Title Pt will ambulate at least 230' over indoor surfaces with cane vs. LRAD with supervision in order to demo improved household mobility.    Baseline able to ambulate 115' today with min guard before needing seated  rest break.    Time 4    Period Weeks    Status On-going          Revised/ongoing LTGs for re-cert:      PT Long Term Goals - 02/14/21 0913      PT LONG TERM GOAL #1   Title Pt will be independent with final HEP in order to indicate improved functional mobility and decreased fall risk.  ALL LTGS DUE 03/14/21    Baseline review and update as needed.    Time 4    Period Weeks    Status On-going    Target Date 03/14/21      PT LONG TERM GOAL #2   Title Pt to improve BERG score to at least a 45/56 in order to demo decr fall risk.    Baseline 01/16/21 BERG score 44; 02/13/21 - 42/56    Time 4    Period Weeks    Status Revised      PT LONG TERM GOAL #3   Title Pt will maintain gait speed to 2.2 ft/sec with SPC  in order to demo improved mobility.    Baseline 14.59 seconds = 2.25 ft/sec on 02/13/21  with SPC and quad tip    Time 4    Period Weeks    Status New      PT LONG TERM GOAL #4   Title Pt will ambulate at least 230' over indoor surfaces with cane vs. LRAD with supervision in order to demo improved household mobility.    Baseline able to ambulate 115' today with min guard before needing seated rest break.    Time 4    Period Weeks    Status On-going               Plan - 02/14/21 0902    Clinical Impression Statement Today's skilled session focused on assessing pt's LTGs for re-cert. Pt met LTG #3 and #5 in regards to gait speed and TUG. Pt's gait speed with SPC with quad tip was 2.25 ft/sec, indicating that pt is a limited community ambulator. Pt's TUG score with cane was 12.7 seconds (previously 14.67 seconds). Pt did not meet LTG in regards to BERG, scored a 42/56 today indicating that pt is still at a significant risk of falls. Continued to educate using the rollator at all times for safety due to continued fall risk. Pt needing multiple seated rest breaks due to feeling SOB throughout session. O2 monitored and sats staying between 95-98% and HR ranging between 95-105 bpm  after standing activity. Will re-cert for an additional 2x week for 4 weeks to continue to address gait, balance, strengthening, endurance in order to decr pt's fall risk and improve safe mobility. LTGs updated or revised as appropriate.    Personal Factors and Comorbidities Comorbidity 3+;Past/Current Experience    Comorbidities HTN, CAD, COPD, A-fib, hx of COVID march 2021    Examination-Activity Limitations Stand;Squat;Transfers;Reach Overhead;Locomotion Level    Examination-Participation Restrictions Community Activity;Driving    Stability/Clinical Decision Making Evolving/Moderate complexity    Rehab Potential Good    PT Frequency 2x / week    PT Duration 4 weeks    PT Treatment/Interventions ADLs/Self Care Home Management;DME Instruction;Gait training;Functional mobility training;Stair training;Neuromuscular re-education;Balance training;Therapeutic exercise;Therapeutic activities;Patient/family education;Vestibular;Energy conservation    PT Next Visit Plan pt's wife to come back this session to review HEP and update as needed and initiate walking program in the house with rollator. continue working on balance (EC, narrow BOS, SLS), and stepping strategies. and functional strengthening.    PT Home Exercise Plan [phone] - verbally removed heel raises after recent fall.    Consulted and Agree with Plan of Care Patient           Patient will benefit from skilled therapeutic intervention in order to improve the following deficits and impairments:  Abnormal gait,Decreased activity tolerance,Decreased balance,Decreased endurance,Decreased coordination,Decreased cognition,Decreased knowledge of use of DME,Decreased range of motion,Difficulty walking,Decreased safety awareness,Decreased strength  Visit Diagnosis: Muscle weakness (generalized) - Plan: PT plan of care cert/re-cert  Unsteadiness on feet - Plan: PT plan of care cert/re-cert  Other abnormalities of gait and mobility - Plan: PT  plan of care cert/re-cert  History of falling - Plan: PT plan of care cert/re-cert     Problem List Patient Active Problem List   Diagnosis Date Noted  . AAA (abdominal aortic aneurysm) without rupture ([place]) 11/14/2020  . Subdural hematoma ([place]) 08/15/2020  . Iron deficiency anemia due to chronic blood loss 01/06/2019  . Orthostatic hypotension 01/05/2019    Class: Question of  . Normocytic normochromic anemia 01/05/2019  . Near syncope 01/05/2019  . Chronic respiratory failure with hypoxia ([place]) 06/30/2018  . OSA (obstructive  sleep apnea) 02/11/2018  . Physical deconditioning 08/11/2017  . Shortness of breath 10/24/2014  . COPD (chronic obstructive pulmonary disease) with emphysema ([place]) 09/01/2014  . Fatigue 10/14/2011  . Atrial fibrillation ([place]) 05/15/2011  . Premature ventricular contractions (PVCs) ([facility]) 02/25/2011  . 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  . PERSISTENT DISORDER INITIATING/MAINTAINING SLEEP 05/10/2010  . HYPERCHOLESTEROLEMIA 05/09/2010  . GOUT 05/09/2010  . OBESITY 05/09/2010  . Obstructive sleep apnea 05/09/2010  . Essential hypertension 05/09/2010  . VENTRICULAR TACHYCARDIA 05/09/2010    Arliss Journey , PT, DPT  02/14/2021, 9:17 AM  [facility] [address] [place], [place], [address] Phone: [phone]   Fax:  [phone]  Name: [name] MRN: [id number] Date of Birth: 01-25-1934

## 2021-02-14 NOTE — Patient Instructions (Signed)
Bradley and Daroff's neurology in clinical practice (8th ed., pp. 1853- 1929). Elsevier."> Goldman-Cecil medicine (26th ed., pp. 2489- 2501). Elsevier.">  Peripheral Neuropathy Peripheral neuropathy is a type of nerve damage. It affects nerves that carry signals between the spinal cord and the arms, legs, and the rest of the body (peripheral nerves). It does not affect nerves in the spinal cord or brain. In peripheral neuropathy, one nerve or a group of nerves may be damaged. Peripheral neuropathy is a broad category that includes many specific nerve disorders, like diabetic neuropathy, hereditary neuropathy, and carpal tunnel syndrome. What are the causes? This condition may be caused by:  Diabetes. This is the most common cause of peripheral neuropathy.  Nerve injury.  Pressure or stress on a nerve that lasts a long time.  Lack (deficiency) of B vitamins. This can result from alcoholism, poor diet, or a restricted diet.  Infections.  Autoimmune diseases, such as rheumatoid arthritis and systemic lupus erythematosus.  Nerve diseases that are passed from parent to child (inherited).  Some medicines, such as cancer medicines (chemotherapy).  Poisonous (toxic) substances, such as lead and mercury.  Too little blood flowing to the legs.  Kidney disease.  Thyroid disease. In some cases, the cause of this condition is not known. What are the signs or symptoms? Symptoms of this condition depend on which of your nerves is damaged. Common symptoms include:  Loss of feeling (numbness) in the feet, hands, or both.  Tingling in the feet, hands, or both.  Burning pain.  Very sensitive skin.  Weakness.  Not being able to move a part of the body (paralysis).  Muscle twitching.  Clumsiness or poor coordination.  Loss of balance.  Not being able to control your bladder.  Feeling dizzy.  Sexual problems. How is this diagnosed? Diagnosing and finding the cause of peripheral  neuropathy can be difficult. Your health care provider will take your medical history and do a physical exam. A neurological exam will also be done. This involves checking things that are affected by your brain, spinal cord, and nerves (nervous system). For example, your health care provider will check your reflexes, how you move, and what you can feel. You may have other tests, such as:  Blood tests.  Electromyogram (EMG) and nerve conduction tests. These tests check nerve function and how well the nerves are controlling the muscles.  Imaging tests, such as CT scans or MRI to rule out other causes of your symptoms.  Removing a small piece of nerve to be examined in a lab (nerve biopsy).  Removing and examining a small amount of the fluid that surrounds the brain and spinal cord (lumbar puncture). How is this treated? Treatment for this condition may involve:  Treating the underlying cause of the neuropathy, such as diabetes, kidney disease, or vitamin deficiencies.  Stopping medicines that can cause neuropathy, such as chemotherapy.  Medicine to help relieve pain. Medicines may include: ? Prescription or over-the-counter pain medicine. ? Antiseizure medicine. ? Antidepressants. ? Pain-relieving patches that are applied to painful areas of skin.  Surgery to relieve pressure on a nerve or to destroy a nerve that is causing pain.  Physical therapy to help improve movement and balance.  Devices to help you move around (assistive devices). Follow these instructions at home: Medicines  Take over-the-counter and prescription medicines only as told by your health care provider. Do not take any other medicines without first asking your health care provider.  Do not drive or use heavy   machinery while taking prescription pain medicine. Lifestyle  Do not use any products that contain nicotine or tobacco, such as cigarettes and e-cigarettes. Smoking keeps blood from reaching damaged nerves.  If you need help quitting, ask your health care provider.  Avoid or limit alcohol. Too much alcohol can cause a vitamin B deficiency, and vitamin B is needed for healthy nerves.  Eat a healthy diet. This includes: ? Eating foods that are high in fiber, such as fresh fruits and vegetables, whole grains, and beans. ? Limiting foods that are high in fat and processed sugars, such as fried or sweet foods.   General instructions  If you have diabetes, work closely with your health care provider to keep your blood sugar under control.  If you have numbness in your feet: ? Check every day for signs of injury or infection. Watch for redness, warmth, and swelling. ? Wear padded socks and comfortable shoes. These help protect your feet.  Develop a good support system. Living with peripheral neuropathy can be stressful. Consider talking with a mental health specialist or joining a support group.  Use assistive devices and attend physical therapy as told by your health care provider. This may include using a walker or a cane.  Keep all follow-up visits as told by your health care provider. This is important.   Contact a health care provider if:  You have new signs or symptoms of peripheral neuropathy.  You are struggling emotionally from dealing with peripheral neuropathy.  Your pain is not well-controlled. Get help right away if:  You have an injury or infection that is not healing normally.  You develop new weakness in an arm or leg.  You have fallen or do so frequently. Summary  Peripheral neuropathy is when the nerves in the arms, or legs are damaged, resulting in numbness, weakness, or pain.  There are many causes of peripheral neuropathy, including diabetes, pinched nerves, vitamin deficiencies, autoimmune disease, and hereditary conditions.  Diagnosing and finding the cause of peripheral neuropathy can be difficult. Your health care provider will take your medical history, do a  physical exam, and do tests, including blood tests and nerve function tests.  Treatment involves treating the underlying cause of the neuropathy and taking medicines to help control pain. Physical therapy and assistive devices may also help. This information is not intended to replace advice given to you by your health care provider. Make sure you discuss any questions you have with your health care provider. Document Revised: 06/06/2020 Document Reviewed: 06/06/2020 Elsevier Patient Education  2021 Elsevier Inc.  

## 2021-02-15 ENCOUNTER — Ambulatory Visit: Payer: Medicare Other

## 2021-02-15 ENCOUNTER — Other Ambulatory Visit: Payer: Self-pay

## 2021-02-15 DIAGNOSIS — Z9181 History of falling: Secondary | ICD-10-CM

## 2021-02-15 DIAGNOSIS — M6281 Muscle weakness (generalized): Secondary | ICD-10-CM

## 2021-02-15 DIAGNOSIS — R2681 Unsteadiness on feet: Secondary | ICD-10-CM

## 2021-02-15 NOTE — Therapy (Signed)
[facility] [address] [place], [place], [address] Phone: [phone]   Fax:  [phone]  Physical Therapy Treatment  Patient Details  Name: [name] MRN: [id number] Date of Birth: July 09, 1934 Referring Provider (PT): Dohmeier, Asencion Partridge, MD   Encounter Date: 02/15/2021   PT End of Session - 02/15/21 1153     Visit Number 24    Number of Visits 30    Date for PT Re-Evaluation 76/54/65   per re-cert on 0/3/54 for 4 weeks   Authorization Type U[employer] Medicare    Progress Note Due on Visit 30    PT Start Time 1100    PT Stop Time 1145    PT Time Calculation (min) 45 min    Equipment Utilized During Treatment Gait belt    Activity Tolerance Patient tolerated treatment well;Patient limited by fatigue    Behavior During Therapy [facility] for tasks assessed/performed             Past Medical History:  Diagnosis Date   Afib ([place])    Arthritis    BPH (benign prostatic hyperplasia)    COPD (chronic obstructive pulmonary disease) ([place])    Coronary artery disease    Coronary artery ectasia    Mild CAD with normal systolic function per cath in August of 2011   Depression    Diaphragmatic paralysis    felt to be partially responsible for dyspnea   Dyspnea    due to paralyzed diaphragm and pulmonary issues   Dysrhythmia    GERD (gastroesophageal reflux disease)    Gout    HTN (hypertension)    Hypercholesteremia    Hypertension    Iron deficiency anemia    Microhematuria    Obesity    OSA (obstructive sleep apnea)    CPAP   Paroxysmal atrial fibrillation ([place])    occured in the setting of acute E Coli sepsis and ileius 7/12   PVC (premature ventricular contraction)    s/p PVC ablation 05/29/2010    Past Surgical History:  Procedure Laterality Date   APPENDECTOMY  1978   BIOPSY  02/09/2019   Procedure: BIOPSY;  Surgeon: Clarene Essex, MD;  [facility]: [facility];  Service: Endoscopy;;   BIOPSY  03/02/2019    Procedure: BIOPSY;  Surgeon: Clarene Essex, MD;  [facility];  Service: Endoscopy;;   CARDIAC CATHETERIZATION  04-30-2010   Left main coronary artery is normal.    CARDIOVASCULAR STRESS TEST  03-19-2010   0%   CHOLECYSTECTOMY  1990   COLONOSCOPY N/A 03/02/2019   Procedure: COLONOSCOPY;  Surgeon: Clarene Essex, MD;  [facility];  Service: Endoscopy;  Laterality: N/A;   COLONOSCOPY W/ POLYPECTOMY     ESOPHAGOGASTRODUODENOSCOPY (EGD) WITH PROPOFOL N/A 02/09/2019   Procedure: ESOPHAGOGASTRODUODENOSCOPY (EGD) WITH PROPOFOL;  Surgeon: Clarene Essex, MD;  [facility];  Service: Endoscopy;  Laterality: N/A;   EYE SURGERY Right    catarct   poly removed from nose as a child     PVC ablation  05/29/2010   RADIOLOGY WITH ANESTHESIA N/A 11/07/2020   Procedure: MRV HEAD WITH AND WITHOUT;  Surgeon: Radiologist, Medication, MD;  Location: [facility];  Service: Radiology;  Laterality: N/A;   RADIOLOGY WITH ANESTHESIA N/A 11/07/2020   Procedure: CT LUMBAR SPINE WITHOUT;  Surgeon: Radiologist, Medication, MD;  Location: [facility];  Service: Radiology;  Laterality: N/A;   US ECHOCARDIOGRAPHY  03-09-2010   EF 55-60%    There were no vitals filed for this visit.  Subjective Assessment - 02/15/21 1111     Subjective No recent falls, saw Dr. Brett Fairy yesterday, advised to drink more water    Patient is accompained by: Family member   wife, [name]   Pertinent History HTN, CAD, COPD, A-fib    Limitations Standing;Walking    How long can you sit comfortably? 30'    How long can you stand comfortably? 21'    How long can you walk comfortably? 80'    Patient Stated Goals wants to improve his balance    Pain Onset In the past 7 days                               [facility] Adult PT Treatment/Exercise - 02/15/21 0001       Lumbar Exercises: Seated   Other Seated Lumbar Exercises core exercises of hip tosses, shoulder tosses, chops and victories followed by latisimus press downs, all  10 reps using 2.2# ball    Other Seated Lumbar Exercises seated FAQs and marching with lat pressdowns, alt. 10x per LE      Knee/Hip Exercises: Aerobic   Other Aerobic Scifit L2 8" arms 8                 Balance Exercises - 02/15/21 0001       Balance Exercises: Standing   Other Standing Exercises Comments satnding heel/toe raises using foam roll for balance                 PT Short Term Goals - 02/13/21 1238       PT SHORT TERM GOAL #1   Title ALL STGS = LTGS               PT Long Term Goals - 02/14/21 0913       PT LONG TERM GOAL #1   Title Pt will be independent with final HEP in order to indicate improved functional mobility and decreased fall risk.  ALL LTGS DUE 03/14/21    Baseline review and update as needed.    Time 4    Period Weeks    Status On-going    Target Date 03/14/21      PT LONG TERM GOAL #2   Title Pt to improve BERG score to at least a 45/56 in order to demo decr fall risk.    Baseline 01/16/21 BERG score 44; 02/13/21 - 42/56    Time 4    Period Weeks    Status Revised      PT LONG TERM GOAL #3   Title Pt will maintain gait speed to 2.2 ft/sec with SPC  in order to demo improved mobility.    Baseline 14.59 seconds = 2.25 ft/sec on 02/13/21 with SPC and quad tip    Time 4    Period Weeks    Status New      PT LONG TERM GOAL #4   Title Pt will ambulate at least 230' over indoor surfaces with cane vs. LRAD with supervision in order to demo improved household mobility.    Baseline able to ambulate 115' today with min guard before needing seated rest break.    Time 4    Period Weeks    Status On-going                   Plan - 02/15/21 1154     Clinical Impression Statement Todays session focused on core and  LE strengthening as well as UE/LE dissociation tasks designed to improve overall flexibility and ability to repoond to balance challenges, continues with difficulty during SLS tasks and activities outside his BOS     Personal Factors and Comorbidities Comorbidity 3+;Past/Current Experience    Comorbidities HTN, CAD, COPD, A-fib, hx of COVID march 2021    Examination-Activity Limitations Stand;Squat;Transfers;Reach Overhead;Locomotion Level    Examination-Participation Restrictions Community Activity;Driving    Stability/Clinical Decision Making Evolving/Moderate complexity    Rehab Potential Good    PT Frequency 2x / week    PT Duration 4 weeks    PT Treatment/Interventions ADLs/Self Care Home Management;DME Instruction;Gait training;Functional mobility training;Stair training;Neuromuscular re-education;Balance training;Therapeutic exercise;Therapeutic activities;Patient/family education;Vestibular;Energy conservation    PT Next Visit Plan f/u on effect of new execises and balance tasks, continue balance training emphasizing SLS tasks and working outside [place] - verbally removed heel raises after recent fall.    Consulted and Agree with Plan of Care Patient             Patient will benefit from skilled therapeutic intervention in order to improve the following deficits and impairments:  Abnormal gait, Decreased activity tolerance, Decreased balance, Decreased endurance, Decreased coordination, Decreased cognition, Decreased knowledge of use of DME, Decreased range of motion, Difficulty walking, Decreased safety awareness, Decreased strength  Visit Diagnosis: Muscle weakness (generalized)  Unsteadiness on feet  History of falling     Problem List Patient Active Problem List   Diagnosis Date Noted   Neuropathy 02/14/2021   At high risk for injury related to fall 02/14/2021   Muscle atrophy of lower extremity 02/14/2021   AAA (abdominal aortic aneurysm) without rupture ([place]) 11/14/2020   Subdural hematoma ([facility]) 08/15/2020   Iron deficiency anemia due to chronic blood loss 01/06/2019   Orthostatic hypotension 01/05/2019    Class: Question of   Normocytic  normochromic anemia 01/05/2019   Near syncope 01/05/2019   Chronic respiratory failure with hypoxia ([place]) 06/30/2018   OSA (obstructive sleep apnea) 02/11/2018   Physical deconditioning 08/11/2017   Shortness of breath 10/24/2014   COPD (chronic obstructive pulmonary disease) with emphysema ([place]) 09/01/2014   Fatigue 10/14/2011   Atrial fibrillation ([place]) 05/15/2011   Premature ventricular contractions (PVCs) ([facility]) 02/25/2011   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   PERSISTENT DISORDER INITIATING/MAINTAINING SLEEP 05/10/2010   HYPERCHOLESTEROLEMIA 05/09/2010   GOUT 05/09/2010   OBESITY 05/09/2010   Obstructive sleep apnea 05/09/2010   Essential hypertension 05/09/2010   VENTRICULAR TACHYCARDIA 05/09/2010    Lanice Shirts 02/15/2021, 1:34 PM  [facility] [address] [place], [place], [address] Phone: [phone]   Fax:  [phone]  Name: [name] MRN: [id number] Date of Birth: 31-Mar-1934

## 2021-02-19 ENCOUNTER — Ambulatory Visit: Payer: Medicare Other | Admitting: Physical Therapy

## 2021-02-19 ENCOUNTER — Other Ambulatory Visit: Payer: Self-pay

## 2021-02-19 ENCOUNTER — Encounter: Payer: Self-pay | Admitting: Physical Therapy

## 2021-02-19 DIAGNOSIS — M6281 Muscle weakness (generalized): Secondary | ICD-10-CM

## 2021-02-19 DIAGNOSIS — R2689 Other abnormalities of gait and mobility: Secondary | ICD-10-CM

## 2021-02-19 DIAGNOSIS — R2681 Unsteadiness on feet: Secondary | ICD-10-CM

## 2021-02-19 NOTE — Therapy (Addendum)
[facility] [address] [place], [place], [address] Phone: [phone]   Fax:  [phone]  Physical Therapy Treatment  Patient Details  Name: [name] MRN: [id number] Date of Birth: 1934/08/14 Referring Provider (PT): Dohmeier, Asencion Partridge, MD   Encounter Date: 02/19/2021   PT End of Session - 02/19/21 1505     Visit Number 25    Number of Visits 30    Date for PT Re-Evaluation 35/45/62   per re-cert on 01/12/37 for 4 weeks   Authorization Type UHC Medicare    Progress Note Due on Visit 30    PT Start Time 1232    PT Stop Time 9373    PT Time Calculation (min) 43 min    Equipment Utilized During Treatment Gait belt    Activity Tolerance Patient tolerated treatment well;Patient limited by fatigue    Behavior During Therapy [facility] for tasks assessed/performed             Past Medical History:  Diagnosis Date   Afib ([place])    Arthritis    BPH (benign prostatic hyperplasia)    COPD (chronic obstructive pulmonary disease) ([place])    Coronary artery disease    Coronary artery ectasia    Mild CAD with normal systolic function per cath in August of 2011   Depression    Diaphragmatic paralysis    felt to be partially responsible for dyspnea   Dyspnea    due to paralyzed diaphragm and pulmonary issues   Dysrhythmia    GERD (gastroesophageal reflux disease)    Gout    HTN (hypertension)    Hypercholesteremia    Hypertension    Iron deficiency anemia    Microhematuria    Obesity    OSA (obstructive sleep apnea)    CPAP   Paroxysmal atrial fibrillation ([place])    occured in the setting of acute E Coli sepsis and ileius 7/12   PVC (premature ventricular contraction)    s/p PVC ablation 05/29/2010    Past Surgical History:  Procedure Laterality Date   APPENDECTOMY  1978   BIOPSY  02/09/2019   Procedure: BIOPSY;  Surgeon: Clarene Essex, MD;  [facility];  Service: Endoscopy;;   BIOPSY  03/02/2019    Procedure: BIOPSY;  Surgeon: Clarene Essex, MD;  [facility];  Service: Endoscopy;;   CARDIAC CATHETERIZATION  04-30-2010   Left main coronary artery is normal.    CARDIOVASCULAR STRESS TEST  03-19-2010   0%   CHOLECYSTECTOMY  1990   COLONOSCOPY N/A 03/02/2019   Procedure: COLONOSCOPY;  Surgeon: Clarene Essex, MD;  [facility];  Service: Endoscopy;  Laterality: N/A;   COLONOSCOPY W/ POLYPECTOMY     ESOPHAGOGASTRODUODENOSCOPY (EGD) WITH PROPOFOL N/A 02/09/2019   Procedure: ESOPHAGOGASTRODUODENOSCOPY (EGD) WITH PROPOFOL;  Surgeon: Clarene Essex, MD;  [facility];  Service: Endoscopy;  Laterality: N/A;   EYE SURGERY Right    catarct   poly removed from nose as a child     PVC ablation  05/29/2010   RADIOLOGY WITH ANESTHESIA N/A 11/07/2020   Procedure: MRV HEAD WITH AND WITHOUT;  Surgeon: Radiologist, Medication, MD;  Location: [place];  Service: Radiology;  Laterality: N/A;   RADIOLOGY WITH ANESTHESIA N/A 11/07/2020   Procedure: CT LUMBAR SPINE WITHOUT;  Surgeon: Radiologist, Medication, MD;  Location: [facility];  Service: Radiology;  Laterality: N/A;   US ECHOCARDIOGRAPHY  03-09-2010   EF 55-60%    There were no vitals filed for this visit.  Subjective Assessment - 02/19/21 1238     Subjective Pt's spouse reports that he goes back to sit in his chair without the use of his rollator.    Patient is accompained by: Family member   wife, [name]   Pertinent History HTN, CAD, COPD, A-fib    Limitations Standing;Walking    How long can you sit comfortably? 62'    How long can you stand comfortably? 87'    How long can you walk comfortably? 74'    Patient Stated Goals wants to improve his balance    Currently in Pain? No/denies    Pain Onset In the past 7 days                               [facility] Adult PT Treatment/Exercise - 02/19/21 1238       Ambulation/Gait   Ambulation/Gait Yes    Ambulation/Gait Assistance 5: Supervision    Ambulation/Gait  Assistance Details trialed RW today during clinic for incr stability during gait vs. using a rollator, pt reporting feeling more steady and going slower with use of RW. provided tennis balls for home for use of RW    Ambulation Distance (Feet) 230 Feet    Assistive device Rolling walker    Gait Pattern Step-through pattern;Decreased step length - right;Decreased step length - left;Decreased stride length    Ambulation Surface Level;Indoor      Therapeutic Activites    Therapeutic Activities Other Therapeutic Activities    Other Therapeutic Activities discussed safety with sit <> stands at home at pt's spouse reports he gets close to the chair and leaves his rollator behind, continued to discuss importance of using rollator to turn fully and feel BLE against chair and then sitting down, both verbalized understanding. also discussed initial walking program at home walking loops around house, walking with his spouse and using RW beginning at 4 minutes               Access Code: [email or web address] URL: [email or web address] Date: 02/19/2021 Prepared by: Janann August  Reviewed and updated HEP to include seated exercises only for incr safety as pt had a hx of a fall standing at the countertop.   Exercises Sit to Stand with Armchair - 1 x daily - 5 x weekly - 2 sets - 5 reps Seated Heel Toe Raises - 2 x daily - 5 x weekly - 2 sets - 10 reps Seated March - 2 x daily - 5 x weekly - 1 sets - 10 reps Seated Knee Extension with Resistance - 1 x daily - 5 x weekly - 2 sets - 10 reps Seated Knee Flexion with Anchored Resistance - 1 x daily - 5 x weekly - 2 sets - 10 reps Seated Hip Abduction with Resistance - 1 x daily - 5 x weekly - 2 sets - 10 reps Seated Hip Adduction Isometrics with Ball - 1 x daily - 5 x weekly - 2 sets - 10 reps     PT Education - 02/19/21 1505     Education Details see TA, updated HEP    Person(s) Educated Patient    Methods Demonstration;Verbal  cues;Handout;Explanation    Comprehension Verbalized understanding;Returned demonstration              PT Short Term Goals - 02/13/21 1238       PT SHORT TERM GOAL #1   Title ALL STGS = LTGS  PT Long Term Goals - 02/14/21 0913       PT LONG TERM GOAL #1   Title Pt will be independent with final HEP in order to indicate improved functional mobility and decreased fall risk.  ALL LTGS DUE 03/14/21    Baseline review and update as needed.    Time 4    Period Weeks    Status On-going    Target Date 03/14/21      PT LONG TERM GOAL #2   Title Pt to improve BERG score to at least a 45/56 in order to demo decr fall risk.    Baseline 01/16/21 BERG score 44; 02/13/21 - 42/56    Time 4    Period Weeks    Status Revised      PT LONG TERM GOAL #3   Title Pt will maintain gait speed to 2.2 ft/sec with SPC  in order to demo improved mobility.    Baseline 14.59 seconds = 2.25 ft/sec on 02/13/21 with SPC and quad tip    Time 4    Period Weeks    Status New      PT LONG TERM GOAL #4   Title Pt will ambulate at least 230' over indoor surfaces with cane vs. LRAD with supervision in order to demo improved household mobility.    Baseline able to ambulate 115' today with min guard before needing seated rest break.    Time 4    Period Weeks    Status On-going                   Plan - 02/19/21 1642     Clinical Impression Statement Trialed use of RW today during gait for improved stability vs. rollator. Pt requiring supervision with RW, did need cues to relax BUE throughout. Discussed may be safer option compared to the rollator. Also updated pt's HEP to include seated exercises.    Personal Factors and Comorbidities Comorbidity 3+;Past/Current Experience    Comorbidities HTN, CAD, COPD, A-fib, hx of COVID march 2021    Examination-Activity Limitations Stand;Squat;Transfers;Reach Overhead;Locomotion Level    Examination-Participation Restrictions Community  Activity;Driving    Stability/Clinical Decision Making Evolving/Moderate complexity    Rehab Potential Good    PT Frequency 2x / week    PT Duration 4 weeks    PT Treatment/Interventions ADLs/Self Care Home Management;DME Instruction;Gait training;Functional mobility training;Stair training;Neuromuscular re-education;Balance training;Therapeutic exercise;Therapeutic activities;Patient/family education;Vestibular;Energy conservation    PT Next Visit Plan gait training with RW. continue balance training emphasizing SLS tasks and working outside [place] with Plan of Care Patient             Patient will benefit from skilled therapeutic intervention in order to improve the following deficits and impairments:  Abnormal gait, Decreased activity tolerance, Decreased balance, Decreased endurance, Decreased coordination, Decreased cognition, Decreased knowledge of use of DME, Decreased range of motion, Difficulty walking, Decreased safety awareness, Decreased strength  Visit Diagnosis: Muscle weakness (generalized)  Other abnormalities of gait and mobility  Unsteadiness on feet     Problem List Patient Active Problem List   Diagnosis Date Noted   Neuropathy 02/14/2021   At high risk for injury related to fall 02/14/2021   Muscle atrophy of lower extremity 02/14/2021   AAA (abdominal aortic aneurysm) without rupture ([place]) 11/14/2020   Subdural hematoma ([place]) 08/15/2020   Iron deficiency anemia due to chronic blood loss 01/06/2019   Orthostatic  hypotension 01/05/2019    Class: Question of   Normocytic normochromic anemia 01/05/2019   Near syncope 01/05/2019   Chronic respiratory failure with hypoxia ([facility]) 06/30/2018   OSA (obstructive sleep apnea) 02/11/2018   Physical deconditioning 08/11/2017   Shortness of breath 10/24/2014   COPD (chronic obstructive pulmonary disease) with emphysema ([facility]) 09/01/2014   Fatigue 10/14/2011    Atrial fibrillation ([place]) 05/15/2011   Premature ventricular contractions (PVCs) ([facility]) 02/25/2011   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   PERSISTENT DISORDER INITIATING/MAINTAINING SLEEP 05/10/2010   HYPERCHOLESTEROLEMIA 05/09/2010   GOUT 05/09/2010   OBESITY 05/09/2010   Obstructive sleep apnea 05/09/2010   Essential hypertension 05/09/2010   VENTRICULAR TACHYCARDIA 05/09/2010    Arliss Journey, PT, DPT  02/19/2021, 4:44 PM  [facility] [address] [place], [place], [address] Phone: [phone]   Fax:  [phone]  Name: [name] MRN: [id number] Date of Birth: [birth year]

## 2021-02-19 NOTE — Progress Notes (Addendum)
Cardiology Office Note:    Date:  02/20/2021   ID:  [name], DOB [birth year], MRN [id number]  PCP:  Drew Battles, MD   [facility] Providers Cardiologist:  Drew Grayer, MD Cardiology APP:  [name]     Referring MD: Drew Battles, MD   Chief Complaint:  Follow-up (CAD, atrial fibrillation)    Patient Profile:    [name] is a 87 y.o. male with:  Coronary artery disease Non-obstructive by cath in 2011 PVCs S/p RF ablation in 05/2010 Paroxysmal atrial fibrillation Off anticoagulation due GI bleeding, frequent falls Abdominal aortic aneurysm 3.2 cm by MRI in 3/22 >> f/u US rec in 6-12 mos Chronic dyspnea Stage IV COPD Chronic respiratory failure, on chronic O2 Paralyzed hemidiaphragm Hypertension Hyperlipidemia OSA Hx of falls Neurology work up ongoing; EMG with primary motor neuropathy; MRI with severe R L5-S1 foraminal narrowing ETOH abuse Depression    Prior CV studies: Echocardiogram 12/08/2019 EF 55-60, no RWMA, moderate LVH, normal RVSF, RVSP 35.7, severe LAE, mild MR, trivial AI, AV sclerosis without stenosis   48-hour Holter 02/2018 Sinus rhythm with first degree AV block, Right bundle branch block Average heart rate is 62 bpm Occasional premature ventricular contractions Frequent premature atrial contractions with nonsustained atrial tachycardia Nonsustained atrial fibrillation also observed Nocturnal bradycardia with mobitz I second degree AV block is noted transiently No sustained arrhythmias No prolonged pauses   Myoview 11/08/2014 EF 63, probable diaphragmatic attenuation, no ischemia, low risk   Cardiac catheterization 04/30/2010 Mild diffuse coronary artery ectasia but no significant stenosis   History of Present Illness: [name] was last seen in 3/22.  He and his wife are concerned about being off of anticoagulation and increased stroke risk.  I reviewed this with Dr. Rayann [name] who felt that his risk for  undergoing the Watchman procedure would be significant and he is not felt to be a great candidate.  Since last seen, he was seen in the emergency room with another fall on 01/22/2021.  Head CT was negative for bleed.  He returns for follow-up.  He is here with his daughter.  He continues to have issues with balance.  He sees neuro and walks with a rolling walker.  He is also going to PT/OT.  He has not had chest pain, syncope. He has mild leg edema esp on the L.  This is stable.  He has chronic shortness of breath.  This is stable.     Past Medical History:  Diagnosis Date   Afib ([place])    Arthritis    BPH (benign prostatic hyperplasia)    COPD (chronic obstructive pulmonary disease) (HCC)    Coronary artery disease    Coronary artery ectasia    Mild CAD with normal systolic function per cath in August of 2011   Depression    Diaphragmatic paralysis    felt to be partially responsible for dyspnea   Dyspnea    due to paralyzed diaphragm and pulmonary issues   Dysrhythmia    GERD (gastroesophageal reflux disease)    Gout    HTN (hypertension)    Hypercholesteremia    Hypertension    Iron deficiency anemia    Microhematuria    Obesity    OSA (obstructive sleep apnea)    CPAP   Paroxysmal atrial fibrillation ([place])    occured in the setting of acute E Coli sepsis and ileius 7/12   PVC (premature ventricular contraction)    s/p PVC ablation 05/29/2010  Current Medications: Current Meds  Medication Sig   acetaminophen (TYLENOL) 325 MG tablet Take 2 tablets (650 mg total) by mouth every 6 (six) hours as needed for mild pain.   allopurinol (ZYLOPRIM) 300 MG tablet Take 300 mg by mouth daily.   ANORO ELLIPTA 62.5-25 MCG/INH AEPB Inhale 1 puff into the lungs daily.   B-12 MICROLOZENGE 500 MCG SUBL Place 1 tablet under the tongue daily.   buPROPion (WELLBUTRIN SR) 100 MG 12 hr tablet Take 100 mg by mouth 2 (two) times daily.    doxazosin (CARDURA) 4 MG tablet Take 0.5 tablets (2 mg total)  by mouth at bedtime.   ferrous sulfate 325 (65 FE) MG tablet Take 325 mg by mouth daily with breakfast.   hydrALAZINE (APRESOLINE) 25 MG tablet Take 1 tablet (25 mg total) by mouth as needed (If pressure is 160/95 take one tablet.).   hydrochlorothiazide (HYDRODIURIL) 25 MG tablet Take 25 mg by mouth daily.   lovastatin (MEVACOR) 40 MG tablet Take 40 mg by mouth daily.   OXYGEN Inhale 2 L/min into the lungs daily as needed (SOB).    pantoprazole (PROTONIX) 40 MG tablet Take 40 mg by mouth daily.   sertraline (ZOLOFT) 100 MG tablet Take 100 mg by mouth daily.   traZODone (DESYREL) 100 MG tablet Take 100 mg by mouth at bedtime.    [DISCONTINUED] vitamin B-12 (CYANOCOBALAMIN) 500 MCG tablet Take 500 mcg by mouth daily.     Allergies:   Patient has no known allergies.   Social History   Tobacco Use   Smoking status: Former    Pack years: 0.00    Types: Cigarettes    Quit date: 1985    Years since quitting: 37.4   Smokeless tobacco: Never  Vaping Use   Vaping Use: Never used  Substance Use Topics   Alcohol use: Yes    Alcohol/week: 7.0 standard drinks    Types: 7 Standard drinks or equivalent per week    Comment: 1 drink per day    Drug use: Never     Family Hx: The patient's family history includes Aneurysm (age of onset: 10) in his father; Breast cancer in his daughter and mother.  ROS   EKGs/Labs/Other Test Reviewed:    EKG:  EKG is not ordered today.  The ekg ordered today demonstrates n/a  Recent Labs: 10/17/2020: TSH 2.090 01/22/2021: ALT 13; BUN 23; Creatinine, Ser 1.11; Hemoglobin 12.5; Platelets 152; Potassium 3.3; Sodium 141   Recent Lipid Panel No results found for: CHOL, TRIG, HDL, LDLCALC, LDLDIRECT    Risk Assessment/Calculations:    CHA2DS2-VASc Score = 4  This indicates a 4.8% annual risk of stroke. The patient's score is based upon: CHF History: No HTN History: Yes Diabetes History: No Stroke History: No Vascular Disease History: Yes Age Score:  2 Gender Score: 0     Physical Exam:    VS:  BP 140/76   Pulse 67   Ht 5\' 10"  (1.778 m)   Wt 191 lb (86.6 kg)   SpO2 96%   BMI 27.41 kg/m     Wt Readings from Last 3 Encounters:  02/20/21 191 lb (86.6 kg)  02/14/21 192 lb (87.1 kg)  01/22/21 193 lb (87.5 kg)     Constitutional:      Appearance: Healthy appearance. Not in distress.  Pulmonary:     Effort: Pulmonary effort is normal.     Breath sounds: No wheezing. No rales.  Cardiovascular:     Normal rate.  Irregularly irregular rhythm. Normal S1. Normal S2.      Murmurs: There is no murmur.  Edema:    Ankle: trace edema of the left ankle. Abdominal:     Palpations: Abdomen is soft.  Musculoskeletal:     Cervical back: Neck supple. Skin:    General: Skin is warm and dry.  Neurological:     General: No focal deficit present.     Mental Status: Alert and oriented to person, place and time.     Cranial Nerves: Cranial nerves are intact.         ASSESSMENT & PLAN:    1. PAF (paroxysmal atrial fibrillation) ([place]) As noted previously, anticoagulation was stopped in 2020 after being admitted with gastrointestinal bleed.  He fell last year and had a subdural hematoma.  He has not been placed back on anticoagulation since.  He had another fall recently.  Likely his head CT did not show any evidence of recurrent bleed.  His daughter notes that their whole family is concerned about him having a stroke.  We again discussed that he is not a good candidate for anticoagulation given increased risk of bleeding.  I explained that I did discuss this with Dr. Rayann [name] and he did not feel that he was a great candidate for Watchman.  However, if the patient and his family would like to meet with Dr. Quentin Ore, I can arrange this so that he can discuss the procedure further as well as risks and benefits.  They will let me know if they would like to see him.  2. Coronary artery disease involving native coronary artery of native heart without  angina pectoris Mild plaque by cardiac catheterization in 2011.  Myoview in 2016 was low risk.  He is not having anginal symptoms.  He is not on ASA due to risk of falls and bleeding risk.  Continue statin Rx.   3. AAA (abdominal aortic aneurysm) without rupture (HCC) 3.2 cm on recent MRI.  Arrange AAA u/s to size.    4. Essential hypertension Fair control.  With risk of falls I would not aggressively lower BP.  Continue current Rx. Recent K+ was low in the ED.  Repeat BMET today.        Dispo:  Return in about 6 months (around 08/22/2021) for Routine follow up in 6 months with Richardson Dopp, PA. .   Medication Adjustments/Labs and Tests Ordered: Current medicines are reviewed at length with the patient today.  Concerns regarding medicines are outlined above.  Tests Ordered: Orders Placed This Encounter  Procedures   Basic Metabolic Panel (BMET)   VAS [place] AAA DUPLEX    Medication Changes: No orders of the defined types were placed in this encounter.   Signed, Richardson Dopp, PA-C  02/20/2021 11:30 AM    [facility] [place], [place],   [address] Phone: [phone]; Fax: [phone]

## 2021-02-19 NOTE — Patient Instructions (Signed)
Access Code: [email or web address] URL: [email or web address] Date: 02/19/2021 Prepared by: Janann August  Exercises Sit to Stand with Armchair - 1 x daily - 5 x weekly - 2 sets - 5 reps Seated Heel Toe Raises - 2 x daily - 5 x weekly - 2 sets - 10 reps Seated March - 2 x daily - 5 x weekly - 1 sets - 10 reps Seated Knee Extension with Resistance - 1 x daily - 5 x weekly - 2 sets - 10 reps Seated Knee Flexion with Anchored Resistance - 1 x daily - 5 x weekly - 2 sets - 10 reps Seated Hip Abduction with Resistance - 1 x daily - 5 x weekly - 2 sets - 10 reps Seated Hip Adduction Isometrics with Ball - 1 x daily - 5 x weekly - 2 sets - 10 reps

## 2021-02-20 ENCOUNTER — Encounter: Payer: Self-pay | Admitting: Physician Assistant

## 2021-02-20 ENCOUNTER — Ambulatory Visit (INDEPENDENT_AMBULATORY_CARE_PROVIDER_SITE_OTHER): Payer: Medicare Other | Admitting: Physician Assistant

## 2021-02-20 VITALS — BP 140/76 | HR 67 | Ht 70.0 in | Wt 191.0 lb

## 2021-02-20 DIAGNOSIS — I10 Essential (primary) hypertension: Secondary | ICD-10-CM | POA: Diagnosis not present

## 2021-02-20 DIAGNOSIS — I480 Paroxysmal atrial fibrillation: Secondary | ICD-10-CM

## 2021-02-20 DIAGNOSIS — I714 Abdominal aortic aneurysm, without rupture, unspecified: Secondary | ICD-10-CM

## 2021-02-20 DIAGNOSIS — I2510 Atherosclerotic heart disease of native coronary artery without angina pectoris: Secondary | ICD-10-CM

## 2021-02-20 LAB — BASIC METABOLIC PANEL
BUN/Creatinine Ratio: 20 (ref 10–24)
BUN: 21 mg/dL (ref 8–27)
CO2: 31 mmol/L — ABNORMAL HIGH (ref 20–29)
Calcium: 9.1 mg/dL (ref 8.6–10.2)
Chloride: 94 mmol/L — ABNORMAL LOW (ref 96–106)
Creatinine, Ser: 1.04 mg/dL (ref 0.76–1.27)
Glucose: 98 mg/dL (ref 65–99)
Potassium: 3.3 mmol/L — ABNORMAL LOW (ref 3.5–5.2)
Sodium: 139 mmol/L (ref 134–144)
eGFR: 70 mL/min/{1.73_m2} (ref >59)

## 2021-02-20 NOTE — Patient Instructions (Signed)
Medication Instructions:   NONE   *If you need a refill on your cardiac medications before your next appointment, please call your pharmacy*   Lab Work: TODAY!!! BMET  If you have labs (blood work) drawn today and your tests are completely normal, you will receive your results only by: [place] (if you have MyChart) OR A paper copy in the mail If you have any lab test that is abnormal or we need to change your treatment, we will call you to review the results.   Testing/Procedures: Your physician has requested that you have an abdominal aorta duplex. During this test, an ultrasound is used to evaluate the aorta. Allow 30 minutes for this exam. Do not eat after midnight the day before and avoid carbonated beverages   Follow-Up: At [facility], you and your health needs are our priority.  As part of our continuing mission to provide you with exceptional heart care, we have created designated Provider Care Teams.  These Care Teams include your primary Cardiologist (physician) and Advanced Practice Providers (APPs -  Physician Assistants and Nurse Practitioners) who all work together to provide you with the care you need, when you need it.  We recommend signing up for the patient portal called "MyChart".  Sign up information is provided on this After Visit Summary.  MyChart is used to connect with patients for Virtual Visits (Telemedicine).  Patients are able to view lab/test results, encounter notes, upcoming appointments, etc.  Non-urgent messages can be sent to your provider as well.   To learn more about what you can do with MyChart, go to [email or web address].    Your next appointment:   6 month(s)  The format for your next appointment:   In Person  Provider:   Richardson Dopp, PA-C   Other Instructions Your physician wants you to follow-up in: 6 months with Richardson Dopp, PA.  You will receive a reminder letter in the mail two months in advance. If you don't receive a  letter, please call our office to schedule the follow-up appointment.

## 2021-02-21 ENCOUNTER — Encounter: Payer: Self-pay | Admitting: Physical Therapy

## 2021-02-21 ENCOUNTER — Ambulatory Visit: Payer: Medicare Other | Admitting: Physical Therapy

## 2021-02-21 ENCOUNTER — Other Ambulatory Visit: Payer: Self-pay | Admitting: *Deleted

## 2021-02-21 ENCOUNTER — Other Ambulatory Visit: Payer: Self-pay

## 2021-02-21 DIAGNOSIS — M6281 Muscle weakness (generalized): Secondary | ICD-10-CM

## 2021-02-21 DIAGNOSIS — Z9181 History of falling: Secondary | ICD-10-CM

## 2021-02-21 DIAGNOSIS — R2681 Unsteadiness on feet: Secondary | ICD-10-CM

## 2021-02-21 DIAGNOSIS — R2689 Other abnormalities of gait and mobility: Secondary | ICD-10-CM

## 2021-02-21 MED ORDER — POTASSIUM CHLORIDE CRYS ER 20 MEQ PO TBCR: EXTENDED_RELEASE_TABLET | ORAL | 3 refills | Status: AC

## 2021-02-21 NOTE — Therapy (Signed)
[facility] [address] [place], [place], [address] Phone: [phone]   Fax:  [phone]  Physical Therapy Treatment  Patient Details  Name: [name] MRN: [id number] Date of Birth: 1934-05-29 Referring Provider (PT): Dohmeier, Asencion Partridge, MD   Encounter Date: 02/21/2021   PT End of Session - 02/21/21 0927     Visit Number 26    Number of Visits 30    Date for PT Re-Evaluation 50/93/26   per re-cert on 03/10/23 for 4 weeks   Authorization Type UHC Medicare    Progress Note Due on Visit 32    PT Start Time 0844    PT Stop Time 0925    PT Time Calculation (min) 41 min    Equipment Utilized During Treatment Gait belt    Activity Tolerance Patient tolerated treatment well;Patient limited by fatigue    Behavior During Therapy [facility] for tasks assessed/performed             Past Medical History:  Diagnosis Date   Afib ([place])    Arthritis    BPH (benign prostatic hyperplasia)    COPD (chronic obstructive pulmonary disease) ([place])    Coronary artery disease    Coronary artery ectasia    Mild CAD with normal systolic function per cath in August of 2011   Depression    Diaphragmatic paralysis    felt to be partially responsible for dyspnea   Dyspnea    due to paralyzed diaphragm and pulmonary issues   Dysrhythmia    GERD (gastroesophageal reflux disease)    Gout    HTN (hypertension)    Hypercholesteremia    Hypertension    Iron deficiency anemia    Microhematuria    Obesity    OSA (obstructive sleep apnea)    CPAP   Paroxysmal atrial fibrillation ([place])    occured in the setting of acute E Coli sepsis and ileius 7/12   PVC (premature ventricular contraction)    s/p PVC ablation 05/29/2010    Past Surgical History:  Procedure Laterality Date   APPENDECTOMY  1978   BIOPSY  02/09/2019   Procedure: BIOPSY;  Surgeon: Clarene Essex, MD;  Location: [facility];  Service: Endoscopy;;   BIOPSY  03/02/2019    Procedure: BIOPSY;  Surgeon: Clarene Essex, MD;  Location: [facility];  Service: Endoscopy;;   CARDIAC CATHETERIZATION  04-30-2010   Left main coronary artery is normal.    CARDIOVASCULAR STRESS TEST  03-19-2010   0%   CHOLECYSTECTOMY  1990   COLONOSCOPY N/A 03/02/2019   Procedure: COLONOSCOPY;  Surgeon: Clarene Essex, MD;  Location: [facility];  Service: Endoscopy;  Laterality: N/A;   COLONOSCOPY W/ POLYPECTOMY     ESOPHAGOGASTRODUODENOSCOPY (EGD) WITH PROPOFOL N/A 02/09/2019   Procedure: ESOPHAGOGASTRODUODENOSCOPY (EGD) WITH PROPOFOL;  Surgeon: Clarene Essex, MD;  Location: [facility];  Service: Endoscopy;  Laterality: N/A;   EYE SURGERY Right    catarct   poly removed from nose as a child     PVC ablation  05/29/2010   RADIOLOGY WITH ANESTHESIA N/A 11/07/2020   Procedure: MRV HEAD WITH AND WITHOUT;  Surgeon: Radiologist, Medication, MD;  Location: [facility];  Service: Radiology;  Laterality: N/A;   RADIOLOGY WITH ANESTHESIA N/A 11/07/2020   Procedure: CT LUMBAR SPINE WITHOUT;  Surgeon: Radiologist, Medication, MD;  Location: [facility];  Service: Radiology;  Laterality: N/A;   US ECHOCARDIOGRAPHY  03-09-2010   EF 55-60%    There were no vitals filed for this visit.  Subjective Assessment - 02/21/21 0846     Subjective No changes since he was last here. Brought in his RW today.    Patient is accompained by: Family member   wife, [name]   Pertinent History HTN, CAD, COPD, A-fib    Limitations Standing;Walking    How long can you sit comfortably? 44'    How long can you stand comfortably? 27'    How long can you walk comfortably? 102'    Patient Stated Goals wants to improve his balance    Currently in Pain? No/denies    Pain Onset In the past 7 days                               [facility] Adult PT Treatment/Exercise - 02/21/21 0851       Ambulation/Gait   Ambulation/Gait Yes    Ambulation/Gait Assistance 5: Supervision    Ambulation/Gait Assistance Details ambulated for 3:30,  cues to relax arms at times during gait    Ambulation Distance (Feet) 345 Feet    Assistive device Rolling walker    Gait Pattern Step-through pattern;Decreased step length - right;Decreased step length - left;Decreased stride length    Ambulation Surface Level;Indoor                 Balance Exercises - 02/21/21 0858       Balance Exercises: Standing   Stepping Strategy Posterior    Stepping Strategy Limitations x15 reps each leg, with UE support and then intermittent cues for weight shift, with min guard    Rockerboard Anterior/posterior;EO;Limitations    Rockerboard Limitations in A/P direction: shifting weight x15 reps with UE support and then none, keeping board steady 2 x 5 reps with min guard/min A for balance throughout    Retro Gait 3 reps;Limitations    Retro Gait Limitations in // bars with intermittent UE support, cues for step length and weight shift over RLE    Marching Intermittent upper extremity assist;Upper extremity assist 1;Forwards    Marching Limitations 3 reps in // bars with min guard, cues for ROM    Sit to Stand Foam/compliant surface;Limitations    Sit to Stand Limitations with UE support, standing on blue air ex, cues for slowed descent without UE support, needing min guard/min A at times for balance in standing    Other Standing Exercises Comments with BUE support: x10 reps heel toe raises                 PT Short Term Goals - 02/13/21 1238       PT SHORT TERM GOAL #1   Title ALL STGS = LTGS               PT Long Term Goals - 02/14/21 0913       PT LONG TERM GOAL #1   Title Pt will be independent with final HEP in order to indicate improved functional mobility and decreased fall risk.  ALL LTGS DUE 03/14/21    Baseline review and update as needed.    Time 4    Period Weeks    Status On-going    Target Date 03/14/21      PT LONG TERM GOAL #2   Title Pt to improve BERG score to at least a 45/56 in order to demo decr fall risk.     Baseline 01/16/21 BERG score 44; 02/13/21 - 42/56    Time 4  Period Weeks    Status Revised      PT LONG TERM GOAL #3   Title Pt will maintain gait speed to 2.2 ft/sec with SPC  in order to demo improved mobility.    Baseline 14.59 seconds = 2.25 ft/sec on 02/13/21 with SPC and quad tip    Time 4    Period Weeks    Status New      PT LONG TERM GOAL #4   Title Pt will ambulate at least 230' over indoor surfaces with cane vs. LRAD with supervision in order to demo improved household mobility.    Baseline able to ambulate 115' today with min guard before needing seated rest break.    Time 4    Period Weeks    Status On-going                   Plan - 02/21/21 5701     Clinical Impression Statement Continued to use RW for gait and pt brought his own in from home. Focused on balance strategies today in the posterior direction as well as on compliant surfaces. Pt needing min guard at min A at times to regain balance. Pt with tendency to lose balance anteriorly while on rockerboard. Will continue to progress towards LTGs.    Personal Factors and Comorbidities Comorbidity 3+;Past/Current Experience    Comorbidities HTN, CAD, COPD, A-fib, hx of COVID march 2021    Examination-Activity Limitations Stand;Squat;Transfers;Reach Overhead;Locomotion Level    Examination-Participation Restrictions Community Activity;Driving    Stability/Clinical Decision Making Evolving/Moderate complexity    Rehab Potential Good    PT Frequency 2x / week    PT Duration 4 weeks    PT Treatment/Interventions ADLs/Self Care Home Management;DME Instruction;Gait training;Functional mobility training;Stair training;Neuromuscular re-education;Balance training;Therapeutic exercise;Therapeutic activities;Patient/family education;Vestibular;Energy conservation    PT Next Visit Plan gait training with RW. continue balance training emphasizing SLS tasks and working outside BOS, posterior direction, compliant surfcaes.     PT Home Exercise Plan [phone]    Consulted and Agree with Plan of Care Patient             Patient will benefit from skilled therapeutic intervention in order to improve the following deficits and impairments:  Abnormal gait, Decreased activity tolerance, Decreased balance, Decreased endurance, Decreased coordination, Decreased cognition, Decreased knowledge of use of DME, Decreased range of motion, Difficulty walking, Decreased safety awareness, Decreased strength  Visit Diagnosis: Muscle weakness (generalized)  Unsteadiness on feet  History of falling  Other abnormalities of gait and mobility     Problem List Patient Active Problem List   Diagnosis Date Noted   Neuropathy 02/14/2021   At high risk for injury related to fall 02/14/2021   Muscle atrophy of lower extremity 02/14/2021   AAA (abdominal aortic aneurysm) without rupture ([place]) 11/14/2020   Subdural hematoma (HCC) 08/15/2020   Iron deficiency anemia due to chronic blood loss 01/06/2019   Orthostatic hypotension 01/05/2019    Class: Question of   Normocytic normochromic anemia 01/05/2019   Near syncope 01/05/2019   Chronic respiratory failure with hypoxia ([place]) 06/30/2018   OSA (obstructive sleep apnea) 02/11/2018   Physical deconditioning 08/11/2017   Shortness of breath 10/24/2014   COPD (chronic obstructive pulmonary disease) with emphysema ([place]) 09/01/2014   Fatigue 10/14/2011   Atrial fibrillation ([place]) 05/15/2011   Premature ventricular contractions (PVCs) ([facility]) 02/25/2011   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   PERSISTENT DISORDER INITIATING/MAINTAINING SLEEP 05/10/2010   HYPERCHOLESTEROLEMIA 05/09/2010   GOUT 05/09/2010   OBESITY  05/09/2010   Obstructive sleep apnea 05/09/2010   Essential hypertension 05/09/2010   VENTRICULAR TACHYCARDIA 05/09/2010    Arliss Journey, PT, DPT  02/21/2021, 9:29 AM  [facility] [address] [place], [place], [address] Phone: [phone]   Fax:  [phone]  Name: [name] MRN: [id number] Date of Birth: 1934/03/12

## 2021-02-22 ENCOUNTER — Other Ambulatory Visit: Payer: Self-pay | Admitting: *Deleted

## 2021-02-22 MED ORDER — HYDROCHLOROTHIAZIDE 25 MG PO TABS: 25.0000 mg | ORAL_TABLET | Freq: Every day | ORAL | 3 refills | Status: AC

## 2021-02-23 ENCOUNTER — Other Ambulatory Visit: Payer: Self-pay | Admitting: *Deleted

## 2021-02-23 DIAGNOSIS — E876 Hypokalemia: Secondary | ICD-10-CM

## 2021-02-26 ENCOUNTER — Encounter: Payer: Self-pay | Admitting: Physical Therapy

## 2021-02-26 ENCOUNTER — Other Ambulatory Visit: Payer: Self-pay

## 2021-02-26 ENCOUNTER — Ambulatory Visit: Payer: Medicare Other | Admitting: Physical Therapy

## 2021-02-26 DIAGNOSIS — M6281 Muscle weakness (generalized): Secondary | ICD-10-CM | POA: Diagnosis not present

## 2021-02-26 DIAGNOSIS — Z9181 History of falling: Secondary | ICD-10-CM

## 2021-02-26 DIAGNOSIS — R2689 Other abnormalities of gait and mobility: Secondary | ICD-10-CM

## 2021-02-26 DIAGNOSIS — R2681 Unsteadiness on feet: Secondary | ICD-10-CM

## 2021-02-26 NOTE — Therapy (Signed)
[facility] [address] [place], [place], [address] Phone: [phone]   Fax:  [phone]  Physical Therapy Treatment  Patient Details  Name: [name] MRN: [id number] Date of Birth: Mar 08, 1934 Referring Provider (PT): Dohmeier, Asencion Partridge, MD   Encounter Date: 02/26/2021   PT End of Session - 02/26/21 1313     Visit Number 27    Number of Visits 30    Date for PT Re-Evaluation 69/48/54   per re-cert on 02/08/69 for 4 weeks   Authorization Type UHC Medicare    Progress Note Due on Visit 30    PT Start Time 1231    PT Stop Time 1311    PT Time Calculation (min) 40 min    Equipment Utilized During Treatment Gait belt    Activity Tolerance Patient tolerated treatment well;Patient limited by fatigue    Behavior During Therapy [facility] for tasks assessed/performed             Past Medical History:  Diagnosis Date   Afib ([place])    Arthritis    BPH (benign prostatic hyperplasia)    COPD (chronic obstructive pulmonary disease) ([place])    Coronary artery disease    Coronary artery ectasia    Mild CAD with normal systolic function per cath in August of 2011   Depression    Diaphragmatic paralysis    felt to be partially responsible for dyspnea   Dyspnea    due to paralyzed diaphragm and pulmonary issues   Dysrhythmia    GERD (gastroesophageal reflux disease)    Gout    HTN (hypertension)    Hypercholesteremia    Hypertension    Iron deficiency anemia    Microhematuria    Obesity    OSA (obstructive sleep apnea)    CPAP   Paroxysmal atrial fibrillation ([place])    occured in the setting of acute E Coli sepsis and ileius 7/12   PVC (premature ventricular contraction)    s/p PVC ablation 05/29/2010    Past Surgical History:  Procedure Laterality Date   APPENDECTOMY  1978   BIOPSY  02/09/2019   Procedure: BIOPSY;  Surgeon: Clarene Essex, MD;  Location: [facility];  Service: Endoscopy;;   BIOPSY  03/02/2019    Procedure: BIOPSY;  Surgeon: Clarene Essex, MD;  [facility]: [facility];  Service: Endoscopy;;   CARDIAC CATHETERIZATION  04-30-2010   Left main coronary artery is normal.    CARDIOVASCULAR STRESS TEST  03-19-2010   0%   CHOLECYSTECTOMY  1990   COLONOSCOPY N/A 03/02/2019   Procedure: COLONOSCOPY;  Surgeon: Clarene Essex, MD;  [facility];  Service: Endoscopy;  Laterality: N/A;   COLONOSCOPY W/ POLYPECTOMY     ESOPHAGOGASTRODUODENOSCOPY (EGD) WITH PROPOFOL N/A 02/09/2019   Procedure: ESOPHAGOGASTRODUODENOSCOPY (EGD) WITH PROPOFOL;  Surgeon: Clarene Essex, MD;  [facility];  Service: Endoscopy;  Laterality: N/A;   EYE SURGERY Right    catarct   poly removed from nose as a child     PVC ablation  05/29/2010   RADIOLOGY WITH ANESTHESIA N/A 11/07/2020   Procedure: MRV HEAD WITH AND WITHOUT;  Surgeon: Radiologist, Medication, MD;  Location: [facility];  Service: Radiology;  Laterality: N/A;   RADIOLOGY WITH ANESTHESIA N/A 11/07/2020   Procedure: CT LUMBAR SPINE WITHOUT;  Surgeon: Radiologist, Medication, MD;  Location: [facility];  Service: Radiology;  Laterality: N/A;   US ECHOCARDIOGRAPHY  03-09-2010   EF 55-60%    There were no vitals filed for this visit.  Subjective Assessment - 02/26/21 1234     Subjective No falls.    Patient is accompained by: Family member   wife, [name]   Pertinent History HTN, CAD, COPD, A-fib    Limitations Standing;Walking    How long can you sit comfortably? 105'    How long can you stand comfortably? 57'    How long can you walk comfortably? 15'    Patient Stated Goals wants to improve his balance    Currently in Pain? No/denies    Pain Onset In the past 7 days                                    Balance Exercises - 02/26/21 1242       Balance Exercises: Standing   Standing Eyes Opened Foam/compliant surface    Standing Eyes Opened Time feet hip width distance 2 x 5 reps head turns, 2 x 5 reps head nods - min guard/min A for  balance    Standing Eyes Closed Wide (BOA);Foam/compliant surface;Limitations    Standing Eyes Closed Limitations 2 x 10 seconds, 2 x 20-30 seconds with intermittent taps to bars for balance, min guard/min A for balance    SLS with Vectors Solid surface;Intermittent upper extremity assist;Upper extremity assist 1;Limitations    SLS with Vectors Limitations alternating taps to 4" step x10 reps B, beginning with UE support and then none, min guard at times for balance    Wall Bumps Eyes opened;Hip;10 reps;Other (comment);Limitations    Wall Bumps Limitations 2 sets of 10 in // bars, cues for proper technique    Stepping Strategy Posterior    Stepping Strategy Limitations x10 reps each leg, with UE support and then intermittent cues for weight shift, with min guard/min A    Step Ups Forward;4 inch;UE support 1    Step Ups Limitations x10 reps B, alternating legs, cues for sequencing    Other Standing Exercises with BUE support: x10 reps heel <> toe raises with cues for proper technique               PT Education - 02/26/21 1313     Education Details importance of drinking water    Person(s) Educated Patient    Methods Explanation    Comprehension Verbalized understanding              PT Short Term Goals - 02/13/21 1238       PT SHORT TERM GOAL #1   Title ALL STGS = LTGS               PT Long Term Goals - 02/14/21 0913       PT LONG TERM GOAL #1   Title Pt will be independent with final HEP in order to indicate improved functional mobility and decreased fall risk.  ALL LTGS DUE 03/14/21    Baseline review and update as needed.    Time 4    Period Weeks    Status On-going    Target Date 03/14/21      PT LONG TERM GOAL #2   Title Pt to improve BERG score to at least a 45/56 in order to demo decr fall risk.    Baseline 01/16/21 BERG score 44; 02/13/21 - 42/56    Time 4    Period Weeks    Status Revised      PT LONG TERM GOAL #3   Title  Pt will maintain gait speed  to 2.2 ft/sec with SPC  in order to demo improved mobility.    Baseline 14.59 seconds = 2.25 ft/sec on 02/13/21 with SPC and quad tip    Time 4    Period Weeks    Status New      PT LONG TERM GOAL #4   Title Pt will ambulate at least 230' over indoor surfaces with cane vs. LRAD with supervision in order to demo improved household mobility.    Baseline able to ambulate 115' today with min guard before needing seated rest break.    Time 4    Period Weeks    Status On-going                   Plan - 02/26/21 1315     Clinical Impression Statement Continued to work on standing balance strategies and decr UE support. Pt challenged by eyes closed and head motions on foam today as well as posterior stepping. Needing min guard/min A throughout session and intermittent seated/standing rest breaks. O2 sats when feeling SOB ranging from 94-97% with cues for pursed lip breathing.    Personal Factors and Comorbidities Comorbidity 3+;Past/Current Experience    Comorbidities HTN, CAD, COPD, A-fib, hx of COVID march 2021    Examination-Activity Limitations Stand;Squat;Transfers;Reach Overhead;Locomotion Level    Examination-Participation Restrictions Community Activity;Driving    Stability/Clinical Decision Making Evolving/Moderate complexity    Rehab Potential Good    PT Frequency 2x / week    PT Duration 4 weeks    PT Treatment/Interventions ADLs/Self Care Home Management;DME Instruction;Gait training;Functional mobility training;Stair training;Neuromuscular re-education;Balance training;Therapeutic exercise;Therapeutic activities;Patient/family education;Vestibular;Energy conservation    PT Next Visit Plan continue balance training emphasizing SLS tasks, working outside BOS, balance strategies in the posterior direction, compliant surfcaces. eyes closed and head motions    PT Home Exercise Plan [id number]    Consulted and Agree with Plan of Care Patient             Patient will benefit  from skilled therapeutic intervention in order to improve the following deficits and impairments:  Abnormal gait, Decreased activity tolerance, Decreased balance, Decreased endurance, Decreased coordination, Decreased cognition, Decreased knowledge of use of DME, Decreased range of motion, Difficulty walking, Decreased safety awareness, Decreased strength  Visit Diagnosis: Muscle weakness (generalized)  Unsteadiness on feet  Other abnormalities of gait and mobility  History of falling     Problem List Patient Active Problem List   Diagnosis Date Noted   Neuropathy 02/14/2021   At high risk for injury related to fall 02/14/2021   Muscle atrophy of lower extremity 02/14/2021   AAA (abdominal aortic aneurysm) without rupture ([place]) 11/14/2020   Subdural hematoma ([facility]) 08/15/2020   Iron deficiency anemia due to chronic blood loss 01/06/2019   Orthostatic hypotension 01/05/2019    Class: Question of   Normocytic normochromic anemia 01/05/2019   Near syncope 01/05/2019   Chronic respiratory failure with hypoxia ([place]) 06/30/2018   OSA (obstructive sleep apnea) 02/11/2018   Physical deconditioning 08/11/2017   Shortness of breath 10/24/2014   COPD (chronic obstructive pulmonary disease) with emphysema ([place]) 09/01/2014   Fatigue 10/14/2011   Atrial fibrillation ([place]) 05/15/2011   Premature ventricular contractions (PVCs) ([facility]) 02/25/2011   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   PERSISTENT DISORDER INITIATING/MAINTAINING SLEEP 05/10/2010   HYPERCHOLESTEROLEMIA 05/09/2010   GOUT 05/09/2010   OBESITY 05/09/2010   Obstructive sleep apnea 05/09/2010   Essential hypertension 05/09/2010   VENTRICULAR TACHYCARDIA 05/09/2010  Arliss Journey, PT, DPT  02/26/2021, 1:16 PM  [facility] [address] [place], [place], [address] Phone: [phone]   Fax:  [phone]  Name: [name] MRN: [id number] Date of Birth:  1934-07-25

## 2021-02-28 ENCOUNTER — Ambulatory Visit: Payer: Medicare Other

## 2021-03-01 ENCOUNTER — Other Ambulatory Visit: Payer: Medicare Other

## 2021-03-02 ENCOUNTER — Other Ambulatory Visit: Payer: Self-pay

## 2021-03-02 ENCOUNTER — Other Ambulatory Visit: Payer: Medicare Other

## 2021-03-02 ENCOUNTER — Other Ambulatory Visit: Payer: Medicare Other | Admitting: *Deleted

## 2021-03-02 DIAGNOSIS — E876 Hypokalemia: Secondary | ICD-10-CM

## 2021-03-02 LAB — BASIC METABOLIC PANEL
BUN/Creatinine Ratio: 13 (ref 10–24)
BUN: 14 mg/dL (ref 8–27)
CO2: 30 mmol/L — ABNORMAL HIGH (ref 20–29)
Calcium: 9.2 mg/dL (ref 8.6–10.2)
Chloride: 96 mmol/L (ref 96–106)
Creatinine, Ser: 1.07 mg/dL (ref 0.76–1.27)
Glucose: 104 mg/dL — ABNORMAL HIGH (ref 65–99)
Potassium: 4.7 mmol/L (ref 3.5–5.2)
Sodium: 140 mmol/L (ref 134–144)
eGFR: 68 mL/min/{1.73_m2} (ref >59)

## 2021-03-05 ENCOUNTER — Encounter: Payer: Self-pay | Admitting: Physical Therapy

## 2021-03-05 ENCOUNTER — Other Ambulatory Visit: Payer: Self-pay

## 2021-03-05 ENCOUNTER — Ambulatory Visit: Payer: Medicare Other | Admitting: Physical Therapy

## 2021-03-05 DIAGNOSIS — M6281 Muscle weakness (generalized): Secondary | ICD-10-CM

## 2021-03-05 DIAGNOSIS — R2689 Other abnormalities of gait and mobility: Secondary | ICD-10-CM

## 2021-03-05 DIAGNOSIS — R2681 Unsteadiness on feet: Secondary | ICD-10-CM

## 2021-03-05 DIAGNOSIS — Z9181 History of falling: Secondary | ICD-10-CM

## 2021-03-05 NOTE — Therapy (Addendum)
[facility] [address] [place], [place], [address] Phone: [phone]   Fax:  [phone]  Physical Therapy Treatment  Patient Details  Name: [name] MRN: [id number] Date of Birth: 04/09/1934 Referring Provider (PT): Dohmeier, Asencion Partridge, MD   Encounter Date: 03/05/2021   PT End of Session - 03/05/21 1318     Visit Number 28    Number of Visits 30    Date for PT Re-Evaluation 02/72/53   per re-cert on 02/13/43 for 4 weeks   Authorization Type UHC Medicare    Progress Note Due on Visit 30    PT Start Time 1231    PT Stop Time 1313    PT Time Calculation (min) 42 min    Equipment Utilized During Treatment Gait belt    Activity Tolerance Patient tolerated treatment well;Patient limited by fatigue    Behavior During Therapy [facility] for tasks assessed/performed             Past Medical History:  Diagnosis Date   Afib ([place])    Arthritis    BPH (benign prostatic hyperplasia)    COPD (chronic obstructive pulmonary disease) ([place])    Coronary artery disease    Coronary artery ectasia    Mild CAD with normal systolic function per cath in August of 2011   Depression    Diaphragmatic paralysis    felt to be partially responsible for dyspnea   Dyspnea    due to paralyzed diaphragm and pulmonary issues   Dysrhythmia    GERD (gastroesophageal reflux disease)    Gout    HTN (hypertension)    Hypercholesteremia    Hypertension    Iron deficiency anemia    Microhematuria    Obesity    OSA (obstructive sleep apnea)    CPAP   Paroxysmal atrial fibrillation ([place])    occured in the setting of acute E Coli sepsis and ileius 7/12   PVC (premature ventricular contraction)    s/p PVC ablation 05/29/2010    Past Surgical History:  Procedure Laterality Date   APPENDECTOMY  1978   BIOPSY  02/09/2019   Procedure: BIOPSY;  Surgeon: Clarene Essex, MD;  [facility];  Service: Endoscopy;;   BIOPSY  03/02/2019    Procedure: BIOPSY;  Surgeon: Clarene Essex, MD;  [facility];  Service: Endoscopy;;   CARDIAC CATHETERIZATION  04-30-2010   Left main coronary artery is normal.    CARDIOVASCULAR STRESS TEST  03-19-2010   0%   CHOLECYSTECTOMY  1990   COLONOSCOPY N/A 03/02/2019   Procedure: COLONOSCOPY;  Surgeon: Clarene Essex, MD;  [facility];  Service: Endoscopy;  Laterality: N/A;   COLONOSCOPY W/ POLYPECTOMY     ESOPHAGOGASTRODUODENOSCOPY (EGD) WITH PROPOFOL N/A 02/09/2019   Procedure: ESOPHAGOGASTRODUODENOSCOPY (EGD) WITH PROPOFOL;  Surgeon: Clarene Essex, MD;  [facility];  Service: Endoscopy;  Laterality: N/A;   EYE SURGERY Right    catarct   poly removed from nose as a child     PVC ablation  05/29/2010   RADIOLOGY WITH ANESTHESIA N/A 11/07/2020   Procedure: MRV HEAD WITH AND WITHOUT;  Surgeon: Radiologist, Medication, MD;  Location: [facility];  Service: Radiology;  Laterality: N/A;   RADIOLOGY WITH ANESTHESIA N/A 11/07/2020   Procedure: CT LUMBAR SPINE WITHOUT;  Surgeon: Radiologist, Medication, MD;  Location: [facility];  Service: Radiology;  Laterality: N/A;   US ECHOCARDIOGRAPHY  03-09-2010   EF 55-60%    There were no vitals filed for this visit.  Subjective Assessment - 03/05/21 1235     Subjective Had a fall the other night, his water bottle went under his bed and then he tried to get it out with his cane and he fell backwards. Got up using his chair lift by the stairs. Wife was not aware of this as she was still sleeping. Wants to take a break with therapy at the end of this week.    Patient is accompained by: Family member   wife, [name]   Pertinent History HTN, CAD, COPD, A-fib    Limitations Standing;Walking    How long can you sit comfortably? 46'    How long can you stand comfortably? 49'    How long can you walk comfortably? 57'    Patient Stated Goals wants to improve his balance    Currently in Pain? No/denies    Pain Onset In the past 7 days                                [facility] Adult PT Treatment/Exercise - 03/05/21 1244       Transfers   Transfers Sit to Stand    Sit to Stand 4: Min guard    Stand to Sit 4: Min guard    Comments x5 reps on level ground - needing UE support to stand (from lower mat table) and x5 reps on blue air ex with BUE support to sit and stand as pt with decr eccentric control, min guard for safey/balance, pt getting balance in standing without UE before sitting back down      Ambulation/Gait   Ambulation/Gait Yes    Ambulation/Gait Assistance 5: Supervision    Ambulation/Gait Assistance Details cues to relax BUE    Ambulation Distance (Feet) 345 Feet    Assistive device Rolling walker    Gait Pattern Step-through pattern;Decreased step length - right;Decreased step length - left;Decreased stride length    Ambulation Surface Level;Indoor      Therapeutic Activites    Therapeutic Activities Other Therapeutic Activities    Other Therapeutic Activities had frank discussion with pt about recent fall and how pt needs to have his life alert on at all times and that pt should wake his wife up if he does have a fall at night/waiting until morning to pick an object off the ground vs. Trying to get up on his own. Discussed POC with pt and how pt has only 1 more appt left this week, pt reporting that he would like to take a break from therapy at this time. Discussed if pt were to return then he would need to have a new order from his physician.                 Balance Exercises - 03/05/21 1300       Balance Exercises: Standing   Standing Eyes Opened Foam/compliant surface   on blue air ex   Standing Eyes Opened Time feet hip width distance 2 x 5 reps head turns, 2 x 5 reps head nods - min guard/min A for balance    Standing Eyes Closed Solid surface;Narrow base of support (BOS)    Standing Eyes Closed Limitations 3 x 30 seconds on level ground    Sidestepping 3 reps    Sidestepping Limitations  down and back at countertop with no UE support., cues for incr foot clearance    Other Standing Exercises with BUE support: x10 reps  heel <> toe raises with cues for proper technique               PT Education - 03/05/21 1318     Education Details see TA    Person(s) Educated Patient    Methods Explanation    Comprehension Verbalized understanding              PT Short Term Goals - 02/13/21 1238       PT SHORT TERM GOAL #1   Title ALL STGS = LTGS               PT Long Term Goals - 03/05/21 1245       PT LONG TERM GOAL #1   Title Pt will be independent with final HEP in order to indicate improved functional mobility and decreased fall risk.  ALL LTGS DUE 03/14/21    Baseline review and update as needed.    Time 4    Period Weeks    Status On-going      PT LONG TERM GOAL #2   Title Pt to improve BERG score to at least a 45/56 in order to demo decr fall risk.    Baseline 01/16/21 BERG score 44; 02/13/21 - 42/56    Time 4    Period Weeks    Status Revised      PT LONG TERM GOAL #3   Title Pt will maintain gait speed to 2.2 ft/sec with SPC  in order to demo improved mobility.    Baseline 14.59 seconds = 2.25 ft/sec on 02/13/21 with SPC and quad tip    Time 4    Period Weeks    Status New      PT LONG TERM GOAL #4   Title Pt will ambulate at least 230' over indoor surfaces with cane vs. LRAD with supervision in order to demo improved household mobility.    Baseline performed with RW on 03/05/21 - 345'    Time 4    Period Weeks    Status Achieved                   Plan - 03/05/21 1640     Clinical Impression Statement Began to assess pt's LTGs today. Pt has met LTG #4 with a RW with supervision. Due to pt's recent falls and impaired balance reactions, determined that RW would be most beneficial for his balance/stability during gait. Remainder of session focused on BLE strengthening and balance on compliant surfaces with pt needing min guard/min A for  safety. Will plan to assess LTGs at next session and D/C (pt in agreement with this plan).    Personal Factors and Comorbidities Comorbidity 3+;Past/Current Experience    Comorbidities HTN, CAD, COPD, A-fib, hx of COVID march 2021    Examination-Activity Limitations Stand;Squat;Transfers;Reach Overhead;Locomotion Level    Examination-Participation Restrictions Community Activity;Driving    Stability/Clinical Decision Making Evolving/Moderate complexity    Rehab Potential Good    PT Frequency 2x / week    PT Duration 4 weeks    PT Treatment/Interventions ADLs/Self Care Home Management;DME Instruction;Gait training;Functional mobility training;Stair training;Neuromuscular re-education;Balance training;Therapeutic exercise;Therapeutic activities;Patient/family education;Vestibular;Energy conservation    PT Next Visit Plan check remainder of LTGs. D/C    PT Home Exercise Plan [phone]    Consulted and Agree with Plan of Care Patient             Patient will benefit from skilled therapeutic intervention in order to improve the following deficits and impairments:  Abnormal gait, Decreased activity tolerance, Decreased balance, Decreased endurance, Decreased coordination, Decreased cognition, Decreased knowledge of use of DME, Decreased range of motion, Difficulty walking, Decreased safety awareness, Decreased strength  Visit Diagnosis: Muscle weakness (generalized)  Unsteadiness on feet  Other abnormalities of gait and mobility  History of falling     Problem List Patient Active Problem List   Diagnosis Date Noted   Neuropathy 02/14/2021   At high risk for injury related to fall 02/14/2021   Muscle atrophy of lower extremity 02/14/2021   AAA (abdominal aortic aneurysm) without rupture ([place]) 11/14/2020   Subdural hematoma (HCC) 08/15/2020   Iron deficiency anemia due to chronic blood loss 01/06/2019   Orthostatic hypotension 01/05/2019    Class: Question of   Normocytic  normochromic anemia 01/05/2019   Near syncope 01/05/2019   Chronic respiratory failure with hypoxia ([place]) 06/30/2018   OSA (obstructive sleep apnea) 02/11/2018   Physical deconditioning 08/11/2017   Shortness of breath 10/24/2014   COPD (chronic obstructive pulmonary disease) with emphysema ([place]) 09/01/2014   Fatigue 10/14/2011   Atrial fibrillation ([place]) 05/15/2011   Premature ventricular contractions (PVCs) ([facility]) 02/25/2011   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   PERSISTENT DISORDER INITIATING/MAINTAINING SLEEP 05/10/2010   HYPERCHOLESTEROLEMIA 05/09/2010   GOUT 05/09/2010   OBESITY 05/09/2010   Obstructive sleep apnea 05/09/2010   Essential hypertension 05/09/2010   VENTRICULAR TACHYCARDIA 05/09/2010    Arliss Journey, PT, DPT  03/05/2021, 4:42 PM  [facility] [address] [place], [place], [address] Phone: [phone]   Fax:  [phone]  Name: [name] MRN: [id number] Date of Birth: [birth year]

## 2021-03-08 ENCOUNTER — Ambulatory Visit: Payer: Medicare Other

## 2021-03-16 ENCOUNTER — Other Ambulatory Visit (HOSPITAL_COMMUNITY): Payer: Medicare Other

## 2021-03-16 ENCOUNTER — Ambulatory Visit (HOSPITAL_COMMUNITY)
Admission: RE | Admit: 2021-03-16 | Discharge: 2021-03-16 | Disposition: A | Discharge status: Home / Self Care | Payer: Medicare Other | Source: Ambulatory Visit | Attending: Cardiology | Admitting: Cardiology

## 2021-03-16 ENCOUNTER — Other Ambulatory Visit: Payer: Self-pay

## 2021-03-16 DIAGNOSIS — I2510 Atherosclerotic heart disease of native coronary artery without angina pectoris: Secondary | ICD-10-CM | POA: Diagnosis present

## 2021-03-16 DIAGNOSIS — I480 Paroxysmal atrial fibrillation: Secondary | ICD-10-CM | POA: Diagnosis not present

## 2021-03-16 DIAGNOSIS — I714 Abdominal aortic aneurysm, without rupture, unspecified: Secondary | ICD-10-CM

## 2021-03-16 DIAGNOSIS — I10 Essential (primary) hypertension: Secondary | ICD-10-CM | POA: Diagnosis not present

## 2021-03-19 ENCOUNTER — Encounter: Payer: Self-pay | Admitting: Physician Assistant

## 2021-03-26 ENCOUNTER — Other Ambulatory Visit: Payer: Self-pay | Admitting: *Deleted

## 2021-03-26 DIAGNOSIS — I714 Abdominal aortic aneurysm, without rupture, unspecified: Secondary | ICD-10-CM

## 2021-04-02 ENCOUNTER — Telehealth: Payer: Self-pay | Admitting: Neurology

## 2021-04-02 DIAGNOSIS — M4807 Spinal stenosis, lumbosacral region: Secondary | ICD-10-CM

## 2021-04-02 DIAGNOSIS — Z9181 History of falling: Secondary | ICD-10-CM

## 2021-04-02 DIAGNOSIS — R296 Repeated falls: Secondary | ICD-10-CM

## 2021-04-02 DIAGNOSIS — G629 Polyneuropathy, unspecified: Secondary | ICD-10-CM

## 2021-04-02 DIAGNOSIS — M6258 Muscle wasting and atrophy, not elsewhere classified, other site: Secondary | ICD-10-CM

## 2021-04-02 DIAGNOSIS — I4891 Unspecified atrial fibrillation: Secondary | ICD-10-CM

## 2021-04-02 NOTE — Telephone Encounter (Signed)
Pt called and LVM stating that he is needing to speak to the Provider but no other information was left. Please advise.

## 2021-04-03 NOTE — Telephone Encounter (Addendum)
Called pt back. Has fallen several more times after last visit. Has knot on head (located top right rear of head).  Went to orthopaedic about a month and a half ago to look at knee. Suggested he get therapy for knee and balance. Went through one therapy session w/ [facility]. Suggested he continue therapy at home. Wanting to know if Dr. Brett Fairy will order more therapy for [facility] door to our office. Advised I will speak w/ MD. If ok, we will place referral.  If not ok, I will call back.   I placed referral for pt.

## 2021-04-11 ENCOUNTER — Ambulatory Visit: Payer: Medicare Other | Attending: Physician Assistant | Admitting: Physical Therapy

## 2021-04-11 ENCOUNTER — Encounter: Payer: Self-pay | Admitting: Physical Therapy

## 2021-04-11 ENCOUNTER — Other Ambulatory Visit: Payer: Self-pay

## 2021-04-11 DIAGNOSIS — M6281 Muscle weakness (generalized): Secondary | ICD-10-CM | POA: Diagnosis present

## 2021-04-11 DIAGNOSIS — Z9181 History of falling: Secondary | ICD-10-CM

## 2021-04-11 DIAGNOSIS — R2681 Unsteadiness on feet: Secondary | ICD-10-CM | POA: Diagnosis present

## 2021-04-11 DIAGNOSIS — R2689 Other abnormalities of gait and mobility: Secondary | ICD-10-CM | POA: Diagnosis present

## 2021-04-11 NOTE — Therapy (Signed)
[name] [address] [place], [place], [address] Phone: [phone]   Fax:  [phone]  Physical Therapy Evaluation  Patient Details  Name: [name] MRN: [id number] Date of Birth: 1934-03-28 Referring Provider (PT): Dohmeier, Asencion Partridge, MD   Encounter Date: 04/11/2021   PT End of Session - 04/11/21 1449     Visit Number 1    Number of Visits 9    Date for PT Re-Evaluation 06/10/21    Authorization Type [employer] Medicare    PT Start Time 1100    PT Stop Time [id number]    PT Time Calculation (min) 45 min    Equipment Utilized During Treatment Gait belt    Activity Tolerance Patient tolerated treatment well    Behavior During Therapy [facility] for tasks assessed/performed             Past Medical History:  Diagnosis Date   AAA (abdominal aortic aneurysm) ([place])    [place] 03/2021: 3.2 cm   Afib (HCC)    Arthritis    BPH (benign prostatic hyperplasia)    COPD (chronic obstructive pulmonary disease) ([place])    Coronary artery disease    Coronary artery ectasia    Mild CAD with normal systolic function per cath in August of 2011   Depression    Diaphragmatic paralysis    felt to be partially responsible for dyspnea   Dyspnea    due to paralyzed diaphragm and pulmonary issues   Dysrhythmia    GERD (gastroesophageal reflux disease)    Gout    HTN (hypertension)    Hypercholesteremia    Hypertension    Iron deficiency anemia    Microhematuria    Obesity    OSA (obstructive sleep apnea)    CPAP   Paroxysmal atrial fibrillation ([place])    occured in the setting of acute E Coli sepsis and ileius 7/12   PVC (premature ventricular contraction)    s/p PVC ablation 05/29/2010    Past Surgical History:  Procedure Laterality Date   APPENDECTOMY  1978   BIOPSY  02/09/2019   Procedure: BIOPSY;  Surgeon: Clarene Essex, MD;  Location: [facility];  Service: Endoscopy;;   BIOPSY  03/02/2019   Procedure: BIOPSY;  Surgeon: Clarene Essex, MD;  Location: [facility];  Service: Endoscopy;;   CARDIAC CATHETERIZATION  04-30-2010   Left main coronary artery is normal.    CARDIOVASCULAR STRESS TEST  03-19-2010   0%   CHOLECYSTECTOMY  1990   COLONOSCOPY N/A 03/02/2019   Procedure: COLONOSCOPY;  Surgeon: Clarene Essex, MD;  Location: [facility];  Service: Endoscopy;  Laterality: N/A;   COLONOSCOPY W/ POLYPECTOMY     ESOPHAGOGASTRODUODENOSCOPY (EGD) WITH PROPOFOL N/A 02/09/2019   Procedure: ESOPHAGOGASTRODUODENOSCOPY (EGD) WITH PROPOFOL;  Surgeon: Clarene Essex, MD;  Location: [facility];  Service: Endoscopy;  Laterality: N/A;   EYE SURGERY Right    catarct   poly removed from nose as a child     PVC ablation  05/29/2010   RADIOLOGY WITH ANESTHESIA N/A 11/07/2020   Procedure: MRV HEAD WITH AND WITHOUT;  Surgeon: Radiologist, Medication, MD;  Location: [facility];  Service: Radiology;  Laterality: N/A;   RADIOLOGY WITH ANESTHESIA N/A 11/07/2020   Procedure: CT LUMBAR SPINE WITHOUT;  Surgeon: Radiologist, Medication, MD;  Location: [facility];  Service: Radiology;  Laterality: N/A;   US ECHOCARDIOGRAPHY  03-09-2010   EF 55-60%    There were no vitals filed for this visit.    Subjective Assessment -  04/11/21 1108     Subjective Had a fall at end of June before he was about to be discharged from PT. Has had 2 falls. The first one was where patient hurt his knee, went to the orthopedic doctor at [facility], had imaging done and physician noted some posttraumatic hematoma and bursitis of the knee. Had only 1 visit with PT at [facility]. 1 fall was standing with his cane and was trying to reach for something with his reacher. Had to have EMS get him up from one of his falls. 2nd fall pt reports he was moving a chair to move something. Using his rollator today.    Patient is accompained by: Family member   wife, [name]   Pertinent History HTN, CAD, COPD, A-fib    Limitations Standing;Walking    Currently in Pain? No/denies    Pain Onset In the  past 7 days                [facility] PT Assessment - 04/11/21 1123       Assessment   Medical Diagnosis frequent falls    Referring Provider (PT) Dohmeier, Asencion Partridge, MD    Onset Date/Surgical Date 08/15/20   from SDH, has had frequent falls recently   Hand Dominance Right    Prior Therapy extensive PT at this location, discharge in June 2022      Precautions   Precautions Fall      Balance Screen   Has the patient fallen in the past 6 months Yes    How many times? multiple    Has the patient had a decrease in activity level because of a fear of falling?  Yes    Is the patient reluctant to leave their home because of a fear of falling?  Yes   pt reports never leaving house due to fear of falling     [place] residence    Living Arrangements Spouse/significant other    Type of [place] to enter    [employer] of Steps 3    Entrance Stairs-Rails Can reach both    [place];Other (Comment)    Home Equipment Walker - standard;Other (comment);Grab bars - tub/shower;Grab bars - toilet;Shower seat;Cane - single point   rollator, SPC with quad tip   Additional Comments takes the stair lift up stairs      Prior Function   Level of Independence Independent    Leisure watching TV, use computer      Observation/Other Assessments   Observations incr swelling to L knee      Posture/Postural Control   Posture/Postural Control Postural limitations    Postural Limitations Rounded Shoulders;Forward head      ROM / Strength   AROM / PROM / Strength Strength      Strength   Right Hip Flexion 3-/5    Left Hip Flexion 3-/5    Right Knee Flexion 4/5    Right Knee Extension 4/5    Left Knee Flexion 4+/5    Left Knee Extension 4+/5    Right Ankle Dorsiflexion 3+/5    Left Ankle Dorsiflexion 3+/5      Transfers   Transfers Sit to Stand    Sit to Stand 4: Min guard    Five time sit to  stand comments  23.53 seconds with BUE support from standard height chair    Stand to Sit 4: Min  guard      Ambulation/Gait   Ambulation/Gait Yes    Ambulation/Gait Assistance 4: Min guard    Ambulation Distance (Feet) --   115   Assistive device Other (Comment)   rollator   Gait Pattern Step-through pattern;Decreased step length - right;Decreased step length - left;Decreased stride length;Decreased hip/knee flexion - right;Decreased hip/knee flexion - left;Decreased dorsiflexion - right;Decreased dorsiflexion - left    Ambulation Surface Level;Indoor    Gait velocity 17.38 seconds = 1.88 ft/sec              Therapeutic Activity: Had long and frank discussion with pt and pt's spouse regarding pt's recent falls. Pt just ended bout of therapy in this location at end of June 2022. During this bout, therapists provided extensive education about fall prevention/safety, seated HEP for strengthening, and proper AD to use. Discussed that due to pt's balance and impaired balance reactions/decr strength, it is safer to use RW for stability vs. Rollator or cane. And that pt should not be using his cane, even inside the house. Pt had his falls in the previous month when he was using his cane/not having enough support and trying to reach for something. Previously with therapy, reiterated importance of not reaching for objects on the ground (as he has had other previous falls doing this) and to always ask for wife for help. Or to only pick something up if he is SITTING in a sturdy chair and can use his reacher to reach directly next to him and not outside BOS. Pt reports that he can get himself up if he crawls to the top of the stairs and can use the stair lift to help get himself up. Reiterated THIS IS NOT SAFE and he should not be doing this at home as he can fall down the stairs. If pt has a fall will need to call EMS for help as previous fall recovery strategies were performed in PT and pt would need too  much assistance to get up that his spouse may hurt herself trying. Pt reports he has also noticed worsening B hand tremors (in recent months). that is making it harder to do IADLs. Discussed that this is something that pt will need to mention to his neurologist.           Objective measurements completed on examination: See above findings.               PT Education - 04/11/21 1449     Education Details see TA    Person(s) Educated Patient;Spouse    Methods Explanation    Comprehension Verbalized understanding              PT Short Term Goals - 04/11/21 1452       PT SHORT TERM GOAL #1   Title ALL STGS = LTGS               PT Long Term Goals - 04/11/21 1452       PT LONG TERM GOAL #1   Title Pt and pt's spuse will be independent with final HEP for strength, balance, and functional transfers.  ALL LTGS DUE 05/09/21    Baseline --    Time 4    Period Weeks    Status New    Target Date 05/09/21      PT LONG TERM GOAL #2   Title Pt will undergo BERG with LTG written as appropriate in order to demo improved balance.  Baseline not yet assessed.    Time 4    Period Weeks    Status New      PT LONG TERM GOAL #3   Title Pt will perform gait speed with RW to at least 2.0 ft/sec to demo improved household mobility and decr fall risk    Baseline 1.88 ft/sec with rollator    Time 4    Period Weeks    Status New      PT LONG TERM GOAL #4   Title 2MWT to be performed in order to demo improved endurance with RW.    Baseline not yet assessed    Time 4    Period Weeks    Status New      PT LONG TERM GOAL #5   Title Pt and pt's spouse will verbalize understanding of fall prevention and safety in the home.    Time 4    Period Weeks    Status New                    Plan - 04/12/21 0910     Clinical Impression Statement Patient is a 87 year old male referred to Neuro OPPT for frequent falls.   Pt's PMH is significant for: SDH 08/2020.  HTN, CAD, COPD, A-fib, hx of COVID march 2021. Pt returns to PT after ending previous bout at the end of June 2022. Pt has had a couple falls in the past month (did not follow therapist's fall prevention education and safety using appropriate AD given during previous PT) and during one fall he hurt his L knee resulting in a postraumatic hematoma (per [facility]). Pt returns using rollator today when previously educated to use RW for improved balance/stability during gait. The following deficits were present during the exam: decr strength, impaired coordination, B hand tremors, incr swelling to L knee, gait abnormalities, balance impairments, postural abnormalities. Pt has had a slowing in his gait speed and 5x sit <> stand measures, putting him at an incr risk for falls. Limited assessment today due to extensive discussion with pt and pt's spouse. Will see pt for 2x week for 4 weeks to continue to reiterate fall prevention and education, review HEP/walking program and continue to work on strength and balance to improve safety and decr pt's risk of falls.    Personal Factors and Comorbidities Comorbidity 3+;Past/Current Experience    Comorbidities SDH 08/2020. HTN, CAD, COPD, A-fib, hx of COVID march 2021    Examination-Activity Limitations Stand;Squat;Transfers;Reach Overhead;Locomotion Level;Stairs    Examination-Participation Restrictions Community Activity;Driving;Cleaning    Stability/Clinical Decision Making Evolving/Moderate complexity    Rehab Potential Good    PT Frequency 2x / week    PT Duration 4 weeks    PT Treatment/Interventions ADLs/Self Care Home Management;DME Instruction;Gait training;Functional mobility training;Stair training;Neuromuscular re-education;Balance training;Therapeutic exercise;Therapeutic activities;Patient/family education;Vestibular;Energy conservation;Manual techniques;Passive range of motion    PT Next Visit Plan perform BERG and 2MWT with RW for endurance and write  goal. review seated HEP from last bout of therapy. continue to reiterate fall prevention    PT Home Exercise Plan [id number]    Consulted and Agree with Plan of Care Patient             Patient will benefit from skilled therapeutic intervention in order to improve the following deficits and impairments:  Abnormal gait, Decreased activity tolerance, Decreased balance, Decreased endurance, Decreased coordination, Decreased cognition, Decreased knowledge of use of DME, Decreased range of motion, Difficulty walking, Decreased safety  awareness, Decreased strength, Decreased knowledge of precautions, Increased edema, Impaired flexibility, Impaired sensation, Postural dysfunction  Visit Diagnosis: Muscle weakness (generalized)  Unsteadiness on feet  History of falling  Other abnormalities of gait and mobility     Problem List Patient Active Problem List   Diagnosis Date Noted   Neuropathy 02/14/2021   At high risk for injury related to fall 02/14/2021   Muscle atrophy of lower extremity 02/14/2021   AAA (abdominal aortic aneurysm) without rupture ([place]) 11/14/2020   Subdural hematoma (HCC) 08/15/2020   Iron deficiency anemia due to chronic blood loss 01/06/2019   Orthostatic hypotension 01/05/2019    Class: Question of   Normocytic normochromic anemia 01/05/2019   Near syncope 01/05/2019   Chronic respiratory failure with hypoxia ([place]) 06/30/2018   OSA (obstructive sleep apnea) 02/11/2018   Physical deconditioning 08/11/2017   Shortness of breath 10/24/2014   COPD (chronic obstructive pulmonary disease) with emphysema ([place]) 09/01/2014   Fatigue 10/14/2011   Atrial fibrillation ([place]) 05/15/2011   Premature ventricular contractions (PVCs) ([facility]) 02/25/2011   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   PERSISTENT DISORDER INITIATING/MAINTAINING SLEEP 05/10/2010   HYPERCHOLESTEROLEMIA 05/09/2010   GOUT 05/09/2010   OBESITY 05/09/2010   Obstructive sleep apnea 05/09/2010   Essential  hypertension 05/09/2010   VENTRICULAR TACHYCARDIA 05/09/2010    Arliss Journey, PT, DPT  04/12/2021, 9:21 AM  [facility] [address] [place], [place], [address] Phone: [phone]   Fax:  [phone]  Name: TOLBERT GAAL MRN: [id number] Date of Birth: 08-Feb-1934

## 2021-04-24 ENCOUNTER — Encounter: Payer: Self-pay | Admitting: Physical Therapy

## 2021-04-24 ENCOUNTER — Other Ambulatory Visit: Payer: Self-pay

## 2021-04-24 ENCOUNTER — Ambulatory Visit: Payer: Medicare Other | Admitting: Physical Therapy

## 2021-04-24 DIAGNOSIS — M6281 Muscle weakness (generalized): Secondary | ICD-10-CM | POA: Diagnosis not present

## 2021-04-24 DIAGNOSIS — R2689 Other abnormalities of gait and mobility: Secondary | ICD-10-CM

## 2021-04-24 DIAGNOSIS — R2681 Unsteadiness on feet: Secondary | ICD-10-CM

## 2021-04-24 NOTE — Therapy (Addendum)
[facility] [address] [place], [place], [address] Phone: [phone]   Fax:  [phone]  Physical Therapy Treatment  Patient Details  Name: [name] MRN: [id number] Date of Birth: 12-08-1933 Referring Provider (PT): Dohmeier, Asencion Partridge, MD   Encounter Date: 04/24/2021   PT End of Session - 04/24/21 1412     Visit Number 2    Number of Visits 9    Date for PT Re-Evaluation 06/10/21    Authorization Type UHC Medicare    PT Start Time 1319    PT Stop Time 1400    PT Time Calculation (min) 41 min    Equipment Utilized During Treatment Gait belt    Activity Tolerance Patient tolerated treatment well    Behavior During Therapy [facility] for tasks assessed/performed             Past Medical History:  Diagnosis Date   AAA (abdominal aortic aneurysm) ([place])    [place] 03/2021: 3.2 cm   Afib (HCC)    Arthritis    BPH (benign prostatic hyperplasia)    COPD (chronic obstructive pulmonary disease) ([place])    Coronary artery disease    Coronary artery ectasia    Mild CAD with normal systolic function per cath in August of 2011   Depression    Diaphragmatic paralysis    felt to be partially responsible for dyspnea   Dyspnea    due to paralyzed diaphragm and pulmonary issues   Dysrhythmia    GERD (gastroesophageal reflux disease)    Gout    HTN (hypertension)    Hypercholesteremia    Hypertension    Iron deficiency anemia    Microhematuria    Obesity    OSA (obstructive sleep apnea)    CPAP   Paroxysmal atrial fibrillation ([place])    occured in the setting of acute E Coli sepsis and ileius 7/12   PVC (premature ventricular contraction)    s/p PVC ablation 05/29/2010    Past Surgical History:  Procedure Laterality Date   APPENDECTOMY  1978   BIOPSY  02/09/2019   Procedure: BIOPSY;  Surgeon: Clarene Essex, MD;  [facility];  Service: Endoscopy;;   BIOPSY  03/02/2019   Procedure: BIOPSY;  Surgeon: Clarene Essex, MD;  [facility];  Service: Endoscopy;;   CARDIAC CATHETERIZATION  04-30-2010   Left main coronary artery is normal.    CARDIOVASCULAR STRESS TEST  03-19-2010   0%   CHOLECYSTECTOMY  1990   COLONOSCOPY N/A 03/02/2019   Procedure: COLONOSCOPY;  Surgeon: Clarene Essex, MD;  [facility];  Service: Endoscopy;  Laterality: N/A;   COLONOSCOPY W/ POLYPECTOMY     ESOPHAGOGASTRODUODENOSCOPY (EGD) WITH PROPOFOL N/A 02/09/2019   Procedure: ESOPHAGOGASTRODUODENOSCOPY (EGD) WITH PROPOFOL;  Surgeon: Clarene Essex, MD;  [facility];  Service: Endoscopy;  Laterality: N/A;   EYE SURGERY Right    catarct   poly removed from nose as a child     PVC ablation  05/29/2010   RADIOLOGY WITH ANESTHESIA N/A 11/07/2020   Procedure: MRV HEAD WITH AND WITHOUT;  Surgeon: Radiologist, Medication, MD;  Location: [place];  Service: Radiology;  Laterality: N/A;   RADIOLOGY WITH ANESTHESIA N/A 11/07/2020   Procedure: CT LUMBAR SPINE WITHOUT;  Surgeon: Radiologist, Medication, MD;  Location: [facility];  Service: Radiology;  Laterality: N/A;   US ECHOCARDIOGRAPHY  03-09-2010   EF 55-60%    There were no vitals filed for this visit.   Subjective Assessment - 04/24/21  1322     Subjective Went to his grandson's wedding and reports it went well. No falls. Has been mostly using the walker at home.    Patient is accompained by: Family member   wife, [name]   Pertinent History HTN, CAD, COPD, A-fib    Limitations Standing;Walking    Currently in Pain? No/denies    Pain Onset In the past 7 days                [facility] PT Assessment - 04/24/21 1323       6 Minute Walk- Baseline   6 Minute Walk- Baseline yes    HR (bpm) 84    02 Sat (%RA) 97 %    Modified Borg Scale for Dyspnea 3- Moderate shortness of breath or breathing difficulty      6 Minute walk- Post Test   6 Minute Walk Post Test yes    HR (bpm) 95    02 Sat (%RA) 93 %    Modified Borg Scale for Dyspnea 3- Moderate shortness of breath or  breathing difficulty    Perceived Rate of Exertion (Borg) 11- Fairly light      6 minute walk test results    Endurance additional comments 2MWT = 200'                Reviewed HEP from last bout of therapy and walking program. Pt has been compliant with some exercises, but not all. Reviewed walking program for improved endurance/strength with RW for pt to perform with spouse as pt has not been doing at home. Educated to make sure that pt's spouse or family can assist him in taking on/off theraband for certain exercises for safety. See MedBridge for further details with exercises.   At end of session, PT discussed all this information to pt's spouse who was out in the waiting room.    Access Code: [email or web address] URL: [email or web address] Date: 04/24/2021 Prepared by: Janann August  Program Notes walking program: begin to walk 2 minutes at home down your hallway with your wife and RW, aim to do for 2 times a day   Exercises Sit to Stand with Armchair - 1 x daily - 5 x weekly - 2 sets - 5 reps Seated Heel Toe Raises - 2 x daily - 5 x weekly - 2 sets - 10 reps Seated March - 2 x daily - 5 x weekly - 1 sets - 10 reps Seated Knee Flexion with Anchored Resistance - 1 x daily - 5 x weekly - 2 sets - 10 reps Seated Hip Abduction with Resistance - 1 x daily - 5 x weekly - 2 sets - 10 reps Seated Hip Adduction Isometrics with Ball - 1 x daily - 5 x weekly - 2 sets - 10 reps Seated Long Arc Quad - 1 x daily - 5 x weekly - 1 sets - 10 reps         [facility] Adult PT Treatment/Exercise - 04/24/21 0001       Ambulation/Gait   Ambulation/Gait Yes    Ambulation/Gait Assistance 5: Supervision    Ambulation/Gait Assistance Details 200' during 2MWT plus additional 2 x 100'    Assistive device Rolling walker    Gait Pattern Step-through pattern;Decreased step length - right;Decreased step length - left;Decreased stride length;Decreased hip/knee flexion - right;Decreased hip/knee  flexion - left;Decreased dorsiflexion - right;Decreased dorsiflexion - left    Ambulation Surface Level;Indoor  PT Education - 04/24/21 1409     Education Details HEP/walking program    Person(s) Educated Patient;Spouse    Methods Explanation;Demonstration;Handout;Verbal cues    Comprehension Verbalized understanding;Returned demonstration              PT Short Term Goals - 04/11/21 1452       PT SHORT TERM GOAL #1   Title ALL STGS = LTGS               PT Long Term Goals - 04/24/21 1409       PT LONG TERM GOAL #1   Title Pt and pt's spuse will be independent with final HEP for strength, balance, and functional transfers.  ALL LTGS DUE 05/09/21    Time 4    Period Weeks    Status New      PT LONG TERM GOAL #2   Title Pt will undergo BERG with LTG written as appropriate in order to demo improved balance.    Baseline not yet assessed.    Time 4    Period Weeks    Status New      PT LONG TERM GOAL #3   Title Pt will perform gait speed with RW to at least 2.0 ft/sec to demo improved household mobility and decr fall risk    Baseline 1.88 ft/sec with rollator    Time 4    Period Weeks    Status New      PT LONG TERM GOAL #4   Title Pt will improve 2MWT distance by at least 50' with RW in order to demo improved gait endurance.    Baseline 200' with RW    Time 4    Period Weeks    Status Revised      PT LONG TERM GOAL #5   Title Pt and pt's spouse will verbalize understanding of fall prevention and safety in the home.    Time 4    Period Weeks    Status New                   Plan - 04/24/21 1410     Clinical Impression Statement Performed the 2MWT today with RW with pt able to ambulate 200'. Pt needed seated rest break afterwards with cues for pursed lip breathing due to SOB. Remainder of session focused on reviewing walking program and HEP from previous bout of therapy as pt has not been consistent with all of it.  Educated wife at end of session about instructions. Will continue to progress towards LTGs.    Personal Factors and Comorbidities Comorbidity 3+;Past/Current Experience    Comorbidities SDH 08/2020. HTN, CAD, COPD, A-fib, hx of COVID march 2021    Examination-Activity Limitations Stand;Squat;Transfers;Reach Overhead;Locomotion Level;Stairs    Examination-Participation Restrictions Community Activity;Driving;Cleaning    Stability/Clinical Decision Making Evolving/Moderate complexity    Rehab Potential Good    PT Frequency 2x / week    PT Duration 4 weeks    PT Treatment/Interventions ADLs/Self Care Home Management;DME Instruction;Gait training;Functional mobility training;Stair training;Neuromuscular re-education;Balance training;Therapeutic exercise;Therapeutic activities;Patient/family education;Vestibular;Energy conservation;Manual techniques;Passive range of motion    PT Next Visit Plan perform BERG. functional BLE strength. balance strategies. walking endurance. continue to reiterate fall prevention    PT Home Exercise Plan [phone]    Consulted and Agree with Plan of Care Patient             Patient will benefit from skilled therapeutic intervention in order to improve the following deficits and  impairments:  Abnormal gait, Decreased activity tolerance, Decreased balance, Decreased endurance, Decreased coordination, Decreased cognition, Decreased knowledge of use of DME, Decreased range of motion, Difficulty walking, Decreased safety awareness, Decreased strength, Decreased knowledge of precautions, Increased edema, Impaired flexibility, Impaired sensation, Postural dysfunction  Visit Diagnosis: Unsteadiness on feet  Muscle weakness (generalized)  Other abnormalities of gait and mobility     Problem List Patient Active Problem List   Diagnosis Date Noted   Neuropathy 02/14/2021   At high risk for injury related to fall 02/14/2021   Muscle atrophy of lower extremity  02/14/2021   AAA (abdominal aortic aneurysm) without rupture ([place]) 11/14/2020   Subdural hematoma (HCC) 08/15/2020   Iron deficiency anemia due to chronic blood loss 01/06/2019   Orthostatic hypotension 01/05/2019    Class: Question of   Normocytic normochromic anemia 01/05/2019   Near syncope 01/05/2019   Chronic respiratory failure with hypoxia ([place]) 06/30/2018   OSA (obstructive sleep apnea) 02/11/2018   Physical deconditioning 08/11/2017   Shortness of breath 10/24/2014   COPD (chronic obstructive pulmonary disease) with emphysema ([place]) 09/01/2014   Fatigue 10/14/2011   Atrial fibrillation ([place]) 05/15/2011   Premature ventricular contractions (PVCs) ([facility]) 02/25/2011   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   PERSISTENT DISORDER INITIATING/MAINTAINING SLEEP 05/10/2010   HYPERCHOLESTEROLEMIA 05/09/2010   GOUT 05/09/2010   OBESITY 05/09/2010   Obstructive sleep apnea 05/09/2010   Essential hypertension 05/09/2010   VENTRICULAR TACHYCARDIA 05/09/2010    Arliss Journey, PT, DPT  04/24/2021, 2:12 PM  [facility] [address] [place], [place], [address] Phone: [phone]   Fax:  [phone]  Name: NYSHAWN SCAMARDO MRN: [id number] Date of Birth: 1934/07/14

## 2021-04-24 NOTE — Patient Instructions (Signed)
Access Code: [email or web address] URL: [email or web address] Date: 04/24/2021 Prepared by: Janann August  Program Notes walking program: begin to walk 2 minutes at home down your hallway with your wife and RW, aim to do for 2 times a day   Exercises Sit to Stand with Armchair - 1 x daily - 5 x weekly - 2 sets - 5 reps Seated Heel Toe Raises - 2 x daily - 5 x weekly - 2 sets - 10 reps Seated March - 2 x daily - 5 x weekly - 1 sets - 10 reps Seated Knee Flexion with Anchored Resistance - 1 x daily - 5 x weekly - 2 sets - 10 reps Seated Hip Abduction with Resistance - 1 x daily - 5 x weekly - 2 sets - 10 reps Seated Hip Adduction Isometrics with Ball - 1 x daily - 5 x weekly - 2 sets - 10 reps Seated Long Arc Quad - 1 x daily - 5 x weekly - 1 sets - 10 reps

## 2021-04-26 ENCOUNTER — Ambulatory Visit: Payer: Medicare Other

## 2021-04-26 ENCOUNTER — Other Ambulatory Visit: Payer: Self-pay

## 2021-04-26 DIAGNOSIS — R2681 Unsteadiness on feet: Secondary | ICD-10-CM

## 2021-04-26 DIAGNOSIS — M6281 Muscle weakness (generalized): Secondary | ICD-10-CM

## 2021-04-26 DIAGNOSIS — R2689 Other abnormalities of gait and mobility: Secondary | ICD-10-CM

## 2021-04-26 NOTE — Therapy (Signed)
[facility] [address] [place], [place], [address] Phone: [phone]   Fax:  [phone]  Physical Therapy Treatment  Patient Details  Name: [name] MRN: [id number] Date of Birth: Jun 04, 1934 Referring Provider (PT): Dohmeier, Asencion Partridge, MD   Encounter Date: 04/26/2021   PT End of Session - 04/26/21 1343     Visit Number 3    Number of Visits 9    Date for PT Re-Evaluation 06/10/21    Authorization Type UHC Medicare    Progress Note Due on Visit 68    PT Start Time 1315    PT Stop Time 1400    PT Time Calculation (min) 45 min    Equipment Utilized During Treatment Gait belt    Activity Tolerance Patient tolerated treatment well             Past Medical History:  Diagnosis Date   AAA (abdominal aortic aneurysm) ([place])    [place] 03/2021: 3.2 cm   Afib (HCC)    Arthritis    BPH (benign prostatic hyperplasia)    COPD (chronic obstructive pulmonary disease) ([place])    Coronary artery disease    Coronary artery ectasia    Mild CAD with normal systolic function per cath in August of 2011   Depression    Diaphragmatic paralysis    felt to be partially responsible for dyspnea   Dyspnea    due to paralyzed diaphragm and pulmonary issues   Dysrhythmia    GERD (gastroesophageal reflux disease)    Gout    HTN (hypertension)    Hypercholesteremia    Hypertension    Iron deficiency anemia    Microhematuria    Obesity    OSA (obstructive sleep apnea)    CPAP   Paroxysmal atrial fibrillation ([place])    occured in the setting of acute E Coli sepsis and ileius 7/12   PVC (premature ventricular contraction)    s/p PVC ablation 05/29/2010    Past Surgical History:  Procedure Laterality Date   APPENDECTOMY  1978   BIOPSY  02/09/2019   Procedure: BIOPSY;  Surgeon: Clarene Essex, MD;  [facility];  Service: Endoscopy;;   BIOPSY  03/02/2019   Procedure: BIOPSY;  Surgeon: Clarene Essex, MD;  [facility];  Service: Endoscopy;;   CARDIAC CATHETERIZATION  04-30-2010   Left main coronary artery is normal.    CARDIOVASCULAR STRESS TEST  03-19-2010   0%   CHOLECYSTECTOMY  1990   COLONOSCOPY N/A 03/02/2019   Procedure: COLONOSCOPY;  Surgeon: Clarene Essex, MD;  [facility];  Service: Endoscopy;  Laterality: N/A;   COLONOSCOPY W/ POLYPECTOMY     ESOPHAGOGASTRODUODENOSCOPY (EGD) WITH PROPOFOL N/A 02/09/2019   Procedure: ESOPHAGOGASTRODUODENOSCOPY (EGD) WITH PROPOFOL;  Surgeon: Clarene Essex, MD;  [facility];  Service: Endoscopy;  Laterality: N/A;   EYE SURGERY Right    catarct   poly removed from nose as a child     PVC ablation  05/29/2010   RADIOLOGY WITH ANESTHESIA N/A 11/07/2020   Procedure: MRV HEAD WITH AND WITHOUT;  Surgeon: Radiologist, Medication, MD;  Location: [facility];  Service: Radiology;  Laterality: N/A;   RADIOLOGY WITH ANESTHESIA N/A 11/07/2020   Procedure: CT LUMBAR SPINE WITHOUT;  Surgeon: Radiologist, Medication, MD;  Location: [facility];  Service: Radiology;  Laterality: N/A;   US ECHOCARDIOGRAPHY  03-09-2010   EF 55-60%    There were no vitals filed for this visit.   Subjective Assessment - 04/26/21 1316  Subjective Reports no pain today.  No recent falls.    Patient is accompained by: --   wife, [name]   Pertinent History HTN, CAD, COPD, A-fib    Limitations Standing;Walking    How long can you sit comfortably? 30'    How long can you stand comfortably? 17'    How long can you walk comfortably? 9'    Patient Stated Goals wants to improve his balance    Currently in Pain? No/denies    Pain Onset In the past 7 days                [facility] PT Assessment - 04/26/21 0001       Berg Balance Test   Sit to Stand Able to stand  independently using hands    Standing Unsupported Able to stand 2 minutes with supervision    Sitting with Back Unsupported but Feet Supported on Floor or Stool Able to sit safely and securely 2 minutes    Stand to Sit Sits  safely with minimal use of hands    Transfers Able to transfer safely, definite need of hands    Standing Unsupported with Eyes Closed Able to stand 10 seconds safely    Standing Unsupported with Feet Together Needs help to attain position but able to stand for 30 seconds with feet together    From Standing, Reach Forward with Outstretched Arm Can reach forward >12 cm safely (5")    From Standing Position, Pick up Object from Floor Able to pick up shoe, needs supervision    From Standing Position, Turn to Look Behind Over each Shoulder Looks behind from both sides and weight shifts well    Turn 360 Degrees Able to turn 360 degrees safely but slowly    Standing Unsupported, Alternately Place Feet on Step/Stool Able to complete >2 steps/needs minimal assist    Standing Unsupported, One Foot in Front Able to take small step independently and hold 30 seconds    Standing on One Leg Unable to try or needs assist to prevent fall    Total Score 37                           [facility] Adult PT Treatment/Exercise - 04/26/21 0001       Transfers   Transfers Sit to Stand    Sit to Stand 4: Min guard    Stand to Sit 4: Min guard      Knee/Hip Exercises: Seated   Long Arc Quad Strengthening;Both;2 sets;15 reps    Long Arc Quad Limitations with latissimus press    Marching Strengthening;Both;2 sets;15 reps    Marching Limitations with latissimus press                 Balance Exercises - 04/26/21 0001       Balance Exercises: Standing   SLS with Vectors Solid surface;Upper extremity assist 1;Limitations    SLS with Vectors Limitations tapping floor pebbles, unilateral to 2 targets, alternating to 2 targets, cross body to 2 targets and cross body to 1 target, 10x with ea. LE                 PT Short Term Goals - 04/11/21 1452       PT SHORT TERM GOAL #1   Title ALL STGS = LTGS               PT Long Term Goals - 04/24/21 1409  PT LONG TERM GOAL #1    Title Pt and pt's spuse will be independent with final HEP for strength, balance, and functional transfers.  ALL LTGS DUE 05/09/21    Time 4    Period Weeks    Status New      PT LONG TERM GOAL #2   Title Pt will undergo BERG with LTG written as appropriate in order to demo improved balance.    Baseline not yet assessed.    Time 4    Period Weeks    Status New      PT LONG TERM GOAL #3   Title Pt will perform gait speed with RW to at least 2.0 ft/sec to demo improved household mobility and decr fall risk    Baseline 1.88 ft/sec with rollator    Time 4    Period Weeks    Status New      PT LONG TERM GOAL #4   Title Pt will improve 2MWT distance by at least 50' with RW in order to demo improved gait endurance.    Baseline 200' with RW    Time 4    Period Weeks    Status Revised      PT LONG TERM GOAL #5   Title Pt and pt's spouse will verbalize understanding of fall prevention and safety in the home.    Time 4    Period Weeks    Status New                   Plan - 04/26/21 1346     Clinical Impression Statement Todays session focused on BERG testing and LE and core strength training with functional movements advancing to stepping and floor targets.  Continues to demo LOB with SLS tasks with no righting reactions noted as he becomes rigid when off balance.  Frequent rest breaks required due to fatigue and SOB.  BERG score less than at DC level.  Reinforced safe transfer techniques and need to pace.    Personal Factors and Comorbidities Comorbidity 3+;Past/Current Experience    Comorbidities SDH 08/2020. HTN, CAD, COPD, A-fib, hx of COVID march 2021    Examination-Activity Limitations Stand;Squat;Transfers;Reach Overhead;Locomotion Level;Stairs    Examination-Participation Restrictions Community Activity;Driving;Cleaning    Stability/Clinical Decision Making Evolving/Moderate complexity    Rehab Potential Good    PT Frequency 2x / week    PT Duration 4 weeks    PT  Treatment/Interventions ADLs/Self Care Home Management;DME Instruction;Gait training;Functional mobility training;Stair training;Neuromuscular re-education;Balance training;Therapeutic exercise;Therapeutic activities;Patient/family education;Vestibular;Energy conservation;Manual techniques;Passive range of motion    PT Next Visit Plan functional BLE strength. balance strategies. walking endurance. continue to reiterate fall prevention    PT Home Exercise Plan [phone]    Consulted and Agree with Plan of Care Patient             Patient will benefit from skilled therapeutic intervention in order to improve the following deficits and impairments:  Abnormal gait, Decreased activity tolerance, Decreased balance, Decreased endurance, Decreased coordination, Decreased cognition, Decreased knowledge of use of DME, Decreased range of motion, Difficulty walking, Decreased safety awareness, Decreased strength, Decreased knowledge of precautions, Increased edema, Impaired flexibility, Impaired sensation, Postural dysfunction  Visit Diagnosis: Unsteadiness on feet  Muscle weakness (generalized)  Other abnormalities of gait and mobility     Problem List Patient Active Problem List   Diagnosis Date Noted   Neuropathy 02/14/2021   At high risk for injury related to fall 02/14/2021   Muscle atrophy of  lower extremity 02/14/2021   AAA (abdominal aortic aneurysm) without rupture ([place]) 11/14/2020   Subdural hematoma (HCC) 08/15/2020   Iron deficiency anemia due to chronic blood loss 01/06/2019   Orthostatic hypotension 01/05/2019    Class: Question of   Normocytic normochromic anemia 01/05/2019   Near syncope 01/05/2019   Chronic respiratory failure with hypoxia (HCC) 06/30/2018   OSA (obstructive sleep apnea) 02/11/2018   Physical deconditioning 08/11/2017   Shortness of breath 10/24/2014   COPD (chronic obstructive pulmonary disease) with emphysema (HCC) 09/01/2014   Fatigue 10/14/2011    Atrial fibrillation ([place]) 05/15/2011   Premature ventricular contractions (PVCs) ([facility]) 02/25/2011   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   PERSISTENT DISORDER INITIATING/MAINTAINING SLEEP 05/10/2010   HYPERCHOLESTEROLEMIA 05/09/2010   GOUT 05/09/2010   OBESITY 05/09/2010   Obstructive sleep apnea 05/09/2010   Essential hypertension 05/09/2010   VENTRICULAR TACHYCARDIA 05/09/2010    Lanice Shirts PT 04/26/2021, 2:12 PM  [facility] [address] [place], [place], [address] Phone: [phone]   Fax:  [phone]  Name: KARRSON KAWCZYNSKI MRN: [id number] Date of Birth: 10-Jul-1934

## 2021-05-01 ENCOUNTER — Other Ambulatory Visit: Payer: Self-pay

## 2021-05-01 ENCOUNTER — Encounter: Payer: Self-pay | Admitting: Physical Therapy

## 2021-05-01 ENCOUNTER — Ambulatory Visit: Payer: Medicare Other | Admitting: Physical Therapy

## 2021-05-01 DIAGNOSIS — R2681 Unsteadiness on feet: Secondary | ICD-10-CM

## 2021-05-01 DIAGNOSIS — M6281 Muscle weakness (generalized): Secondary | ICD-10-CM

## 2021-05-01 DIAGNOSIS — R2689 Other abnormalities of gait and mobility: Secondary | ICD-10-CM

## 2021-05-01 NOTE — Therapy (Signed)
[facility] [address] [place], [place], [address] Phone: [phone]   Fax:  [phone]  Physical Therapy Treatment  Patient Details  Name: [name] MRN: [id number] Date of Birth: Apr 21, 1934 Referring Provider (PT): Dohmeier, Asencion Partridge, MD   Encounter Date: 05/01/2021   PT End of Session - 05/01/21 1407     Visit Number 4    Number of Visits 9    Date for PT Re-Evaluation 06/10/21    Authorization Type [employer] Medicare    Progress Note Due on Visit 43    PT Start Time 1318    PT Stop Time 1400    PT Time Calculation (min) 42 min    Equipment Utilized During Treatment Gait belt    Activity Tolerance Patient tolerated treatment well    Behavior During Therapy [facility] for tasks assessed/performed             Past Medical History:  Diagnosis Date   AAA (abdominal aortic aneurysm) ([place])    [place] 03/2021: 3.2 cm   Afib (HCC)    Arthritis    BPH (benign prostatic hyperplasia)    COPD (chronic obstructive pulmonary disease) ([place])    Coronary artery disease    Coronary artery ectasia    Mild CAD with normal systolic function per cath in August of 2011   Depression    Diaphragmatic paralysis    felt to be partially responsible for dyspnea   Dyspnea    due to paralyzed diaphragm and pulmonary issues   Dysrhythmia    GERD (gastroesophageal reflux disease)    Gout    HTN (hypertension)    Hypercholesteremia    Hypertension    Iron deficiency anemia    Microhematuria    Obesity    OSA (obstructive sleep apnea)    CPAP   Paroxysmal atrial fibrillation ([place])    occured in the setting of acute E Coli sepsis and ileius 7/12   PVC (premature ventricular contraction)    s/p PVC ablation 05/29/2010    Past Surgical History:  Procedure Laterality Date   APPENDECTOMY  1978   BIOPSY  02/09/2019   Procedure: BIOPSY;  Surgeon: Clarene Essex, MD;  Location: [facility];  Service: Endoscopy;;   BIOPSY  03/02/2019    Procedure: BIOPSY;  Surgeon: Clarene Essex, MD;  Location: [facility];  Service: Endoscopy;;   CARDIAC CATHETERIZATION  04-30-2010   Left main coronary artery is normal.    CARDIOVASCULAR STRESS TEST  03-19-2010   0%   CHOLECYSTECTOMY  1990   COLONOSCOPY N/A 03/02/2019   Procedure: COLONOSCOPY;  Surgeon: Clarene Essex, MD;  Location: [facility];  Service: Endoscopy;  Laterality: N/A;   COLONOSCOPY W/ POLYPECTOMY     ESOPHAGOGASTRODUODENOSCOPY (EGD) WITH PROPOFOL N/A 02/09/2019   Procedure: ESOPHAGOGASTRODUODENOSCOPY (EGD) WITH PROPOFOL;  Surgeon: Clarene Essex, MD;  Location: [facility];  Service: Endoscopy;  Laterality: N/A;   EYE SURGERY Right    catarct   poly removed from nose as a child     PVC ablation  05/29/2010   RADIOLOGY WITH ANESTHESIA N/A 11/07/2020   Procedure: MRV HEAD WITH AND WITHOUT;  Surgeon: Radiologist, Medication, MD;  Location: [place];  Service: Radiology;  Laterality: N/A;   RADIOLOGY WITH ANESTHESIA N/A 11/07/2020   Procedure: CT LUMBAR SPINE WITHOUT;  Surgeon: Radiologist, Medication, MD;  Location: [facility];  Service: Radiology;  Laterality: N/A;   US ECHOCARDIOGRAPHY  03-09-2010   EF 55-60%    There were no vitals filed  for this visit.   Subjective Assessment - 05/01/21 1320     Subjective No falls, nothing new. Will be getting surgery for his eyelids in september.    Patient is accompained by: --   wife, [name]   Pertinent History HTN, CAD, COPD, A-fib    Limitations Standing;Walking    How long can you sit comfortably? 30'    How long can you stand comfortably? 56'    How long can you walk comfortably? 31'    Patient Stated Goals wants to improve his balance    Currently in Pain? No/denies    Pain Onset In the past 7 days                               [facility] Adult PT Treatment/Exercise - 05/01/21 1325       Ambulation/Gait   Ambulation/Gait Yes    Ambulation/Gait Assistance 5: Supervision    Ambulation/Gait Assistance Details O2 sats = 95%     Ambulation Distance (Feet) 230 Feet    Assistive device Rolling walker    Gait Pattern Step-through pattern;Decreased step length - right;Decreased step length - left;Decreased stride length;Decreased hip/knee flexion - right;Decreased hip/knee flexion - left;Decreased dorsiflexion - right;Decreased dorsiflexion - left    Ambulation Surface Level;Indoor                 Balance Exercises - 05/01/21 1337       Balance Exercises: Standing   Standing Eyes Opened Narrow base of support (BOS)    Standing Eyes Opened Time on level ground, x10 reps head turns, x10 reps head nods    Standing Eyes Closed Solid surface;Narrow base of support (BOS)    Standing Eyes Closed Limitations 3 x 30 seconds    SLS with Vectors Solid surface;Upper extremity assist 1;Limitations;Intermittent upper extremity assist    SLS with Vectors Limitations alternating toe taps to 4" step x10 reps B, min guard/min A for balance    Wall Bumps Eyes opened;10 reps;Hip;Limitations    Wall Bumps Limitations in // bars, cues for technique    Stepping Strategy Posterior    Stepping Strategy Limitations x10 reps with UE support, min guard/min A, cues for hip hinge when stepping backwards    Step Ups Forward;4 inch;UE support 2    Step Ups Limitations x10 reps B, alternating legs, cues for sequencing    Retro Gait 3 reps;Limitations    Retro Gait Limitations in // bars with intermittent UE support, cues for step length and weight shift over RLE. needs min A for balance at end when pt stops    Marching Intermittent upper extremity assist;Upper extremity assist 1;Forwards    Marching Limitations 3 reps in // bars with min guard, cues for ROM    Other Standing Exercises with BUE support: x10 reps heel <> toe raises                 PT Short Term Goals - 04/11/21 1452       PT SHORT TERM GOAL #1   Title ALL STGS = LTGS               PT Long Term Goals - 04/24/21 1409       PT LONG TERM GOAL #1    Title Pt and pt's spuse will be independent with final HEP for strength, balance, and functional transfers.  ALL LTGS DUE 05/09/21    Time 4  Period Weeks    Status New      PT LONG TERM GOAL #2   Title Pt will undergo BERG with LTG written as appropriate in order to demo improved balance.    Baseline not yet assessed.    Time 4    Period Weeks    Status New      PT LONG TERM GOAL #3   Title Pt will perform gait speed with RW to at least 2.0 ft/sec to demo improved household mobility and decr fall risk    Baseline 1.88 ft/sec with rollator    Time 4    Period Weeks    Status New      PT LONG TERM GOAL #4   Title Pt will improve 2MWT distance by at least 50' with RW in order to demo improved gait endurance.    Baseline 200' with RW    Time 4    Period Weeks    Status Revised      PT LONG TERM GOAL #5   Title Pt and pt's spouse will verbalize understanding of fall prevention and safety in the home.    Time 4    Period Weeks    Status New                   Plan - 05/01/21 1408     Clinical Impression Statement Continued to work on balance reactions today in // bars working on stepping, SLS, narrow BOS, and weight shifting. Pt needing min guard/min A for activities with decr UE support. Reiterated that these are therapy only activities and for pt to not perform at home.    Personal Factors and Comorbidities Comorbidity 3+;Past/Current Experience    Comorbidities SDH 08/2020. HTN, CAD, COPD, A-fib, hx of COVID march 2021    Examination-Activity Limitations Stand;Squat;Transfers;Reach Overhead;Locomotion Level;Stairs    Examination-Participation Restrictions Community Activity;Driving;Cleaning    Stability/Clinical Decision Making Evolving/Moderate complexity    Rehab Potential Good    PT Frequency 2x / week    PT Duration 4 weeks    PT Treatment/Interventions ADLs/Self Care Home Management;DME Instruction;Gait training;Functional mobility training;Stair  training;Neuromuscular re-education;Balance training;Therapeutic exercise;Therapeutic activities;Patient/family education;Vestibular;Energy conservation;Manual techniques;Passive range of motion    PT Next Visit Plan functional BLE strength. balance strategies. walking endurance. continue to reiterate fall prevention    PT Home Exercise Plan [phone]    Consulted and Agree with Plan of Care Patient             Patient will benefit from skilled therapeutic intervention in order to improve the following deficits and impairments:  Abnormal gait, Decreased activity tolerance, Decreased balance, Decreased endurance, Decreased coordination, Decreased cognition, Decreased knowledge of use of DME, Decreased range of motion, Difficulty walking, Decreased safety awareness, Decreased strength, Decreased knowledge of precautions, Increased edema, Impaired flexibility, Impaired sensation, Postural dysfunction  Visit Diagnosis: Unsteadiness on feet  Other abnormalities of gait and mobility  Muscle weakness (generalized)     Problem List Patient Active Problem List   Diagnosis Date Noted   Neuropathy 02/14/2021   At high risk for injury related to fall 02/14/2021   Muscle atrophy of lower extremity 02/14/2021   AAA (abdominal aortic aneurysm) without rupture ([place]) 11/14/2020   Subdural hematoma (HCC) 08/15/2020   Iron deficiency anemia due to chronic blood loss 01/06/2019   Orthostatic hypotension 01/05/2019    Class: Question of   Normocytic normochromic anemia 01/05/2019   Near syncope 01/05/2019   Chronic respiratory failure with hypoxia ([place]) 06/30/2018  OSA (obstructive sleep apnea) 02/11/2018   Physical deconditioning 08/11/2017   Shortness of breath 10/24/2014   COPD (chronic obstructive pulmonary disease) with emphysema (HCC) 09/01/2014   Fatigue 10/14/2011   Atrial fibrillation ([place]) 05/15/2011   Premature ventricular contractions (PVCs) ([facility]) 02/25/2011   Disorder of  diaphragm 06/27/2010   PERSISTENT DISORDER INITIATING/MAINTAINING SLEEP 05/10/2010   HYPERCHOLESTEROLEMIA 05/09/2010   GOUT 05/09/2010   OBESITY 05/09/2010   Obstructive sleep apnea 05/09/2010   Essential hypertension 05/09/2010   VENTRICULAR TACHYCARDIA 05/09/2010    Arliss Journey, PT, DPT  05/01/2021, 2:15 PM  [facility] [address] [place], [place], [address] Phone: [phone]   Fax:  [phone]  Name: [name] MRN: [id number] Date of Birth: Jan 11, 1934

## 2021-05-03 ENCOUNTER — Telehealth: Payer: Self-pay | Admitting: Pulmonary Disease

## 2021-05-03 ENCOUNTER — Ambulatory Visit: Payer: Medicare Other | Admitting: Physical Therapy

## 2021-05-03 MED ORDER — ALBUTEROL SULFATE HFA 108 (90 BASE) MCG/ACT IN AERS: 2.0000 | INHALATION_SPRAY | Freq: Four times a day (QID) | RESPIRATORY_TRACT | 6 refills | Status: AC | PRN

## 2021-05-03 NOTE — Telephone Encounter (Signed)
Can make him an appointment for myself for any of the APPs if he can be seen sooner.  I think he will need a walk to qualify for oxygen  Continue Anoro daily May call in albuterol rescue to be used up to 4 times a day as needed

## 2021-05-03 NOTE — Telephone Encounter (Signed)
Primary Pulmonologist: Dr. Ander Slade Last office visit and with whom: 06/09/2019 What do we see them for (pulmonary problems): OSA, COPD Last OV assessment/plan: see below  Was appointment offered to patient (explain)?    Assessment/plan:  .  Obstructive sleep apnea -Went back to using his old machine -No compliance data available -We will adjust his pressure settings to 5-20 -We will see if he is able to use the CPAP then -Follow-up with compliance data -By having a wide latitude on his pressure requirement-the machine should be able to adjust to his pressure needs   Chronic obstructive pulmonary disease Advanced COPD, stage IV COPD -Has not been on any bronchodilators as stated -We will start him on Anoro -Albuterol as needed    Chronic respiratory failure -Continue using oxygen supplementation   I will see him back in the office in about 3 months   Sherrilyn Rist MD [facility] 06/09/2019, 10:28 AM    Reason for call: I called and spoke with patient wife, who is on DPR. Of note, patient has not been in office for almost 2 years. He has been falling more often and his breathing has been getting worse and patient called and had [employer] and had his oxygen picked up as he no longer needs it. She called [employer] and need to start process all over. He cannot go out without oxygen however. He has been using Anoro daily as prescribed. Still using CPAP. Wife is requesting appt and recs. Will route to Dr. Ander Slade for recs.  Dr. Ander Slade, please advise.     (examples of things to ask: : When did symptoms start? Fever? Cough? Productive? Color to sputum? More sputum than usual? Wheezing? Have you needed increased oxygen? Are you taking your respiratory medications? What over the counter measures have you tried?)  No Known Allergies  Immunization History  Administered Date(s) Administered   Fluad Quad(high Dose 65+) 06/08/2019   Influenza Whole 05/10/2010    Influenza, High Dose Seasonal PF 07/04/2016, 06/29/2018   Influenza,inj,Quad PF,6+ Mos 07/06/2015, 05/05/2017   Influenza-Unspecified 07/17/2014, 06/06/2015   Pneumococcal Polysaccharide-23 05/10/2009

## 2021-05-03 NOTE — Telephone Encounter (Signed)
Spoke with the spouse and notified of response per Dr Jenetta Downer  Albuterol was sent to pharm  I scheduled pt for appt with Dr Jenetta Downer for Monday 06/06/21

## 2021-05-03 NOTE — Addendum Note (Signed)
Addended by: Rosana Berger on: 05/03/2021 05:09 PM   Modules accepted: Orders

## 2021-05-05 ENCOUNTER — Emergency Department (HOSPITAL_COMMUNITY): Payer: Medicare Other

## 2021-05-05 ENCOUNTER — Inpatient Hospital Stay (HOSPITAL_COMMUNITY): Payer: Medicare Other

## 2021-05-05 ENCOUNTER — Inpatient Hospital Stay (HOSPITAL_COMMUNITY)
Admission: EM | Admit: 2021-05-05 | Discharge: 2021-06-09 | DRG: 208 | Disposition: E | Payer: Medicare Other | Attending: Pulmonary Disease | Admitting: Pulmonary Disease

## 2021-05-05 ENCOUNTER — Other Ambulatory Visit: Payer: Self-pay

## 2021-05-05 ENCOUNTER — Encounter (HOSPITAL_COMMUNITY): Payer: Self-pay

## 2021-05-05 DIAGNOSIS — J449 Chronic obstructive pulmonary disease, unspecified: Secondary | ICD-10-CM | POA: Diagnosis present

## 2021-05-05 DIAGNOSIS — Z6827 Body mass index (BMI) 27.0-27.9, adult: Secondary | ICD-10-CM

## 2021-05-05 DIAGNOSIS — R269 Unspecified abnormalities of gait and mobility: Secondary | ICD-10-CM | POA: Diagnosis present

## 2021-05-05 DIAGNOSIS — M109 Gout, unspecified: Secondary | ICD-10-CM | POA: Diagnosis present

## 2021-05-05 DIAGNOSIS — G4733 Obstructive sleep apnea (adult) (pediatric): Secondary | ICD-10-CM | POA: Diagnosis present

## 2021-05-05 DIAGNOSIS — J986 Disorders of diaphragm: Secondary | ICD-10-CM | POA: Diagnosis present

## 2021-05-05 DIAGNOSIS — E872 Acidosis: Secondary | ICD-10-CM | POA: Diagnosis present

## 2021-05-05 DIAGNOSIS — I714 Abdominal aortic aneurysm, without rupture: Secondary | ICD-10-CM | POA: Diagnosis present

## 2021-05-05 DIAGNOSIS — Z4659 Encounter for fitting and adjustment of other gastrointestinal appliance and device: Secondary | ICD-10-CM

## 2021-05-05 DIAGNOSIS — K219 Gastro-esophageal reflux disease without esophagitis: Secondary | ICD-10-CM | POA: Diagnosis present

## 2021-05-05 DIAGNOSIS — Z66 Do not resuscitate: Secondary | ICD-10-CM | POA: Diagnosis not present

## 2021-05-05 DIAGNOSIS — Z9981 Dependence on supplemental oxygen: Secondary | ICD-10-CM

## 2021-05-05 DIAGNOSIS — Z87891 Personal history of nicotine dependence: Secondary | ICD-10-CM

## 2021-05-05 DIAGNOSIS — J9690 Respiratory failure, unspecified, unspecified whether with hypoxia or hypercapnia: Secondary | ICD-10-CM

## 2021-05-05 DIAGNOSIS — R578 Other shock: Secondary | ICD-10-CM | POA: Diagnosis not present

## 2021-05-05 DIAGNOSIS — D638 Anemia in other chronic diseases classified elsewhere: Secondary | ICD-10-CM | POA: Diagnosis present

## 2021-05-05 DIAGNOSIS — Z20822 Contact with and (suspected) exposure to covid-19: Secondary | ICD-10-CM | POA: Diagnosis present

## 2021-05-05 DIAGNOSIS — M1990 Unspecified osteoarthritis, unspecified site: Secondary | ICD-10-CM | POA: Diagnosis present

## 2021-05-05 DIAGNOSIS — S270XXA Traumatic pneumothorax, initial encounter: Secondary | ICD-10-CM | POA: Diagnosis present

## 2021-05-05 DIAGNOSIS — N179 Acute kidney failure, unspecified: Secondary | ICD-10-CM | POA: Diagnosis not present

## 2021-05-05 DIAGNOSIS — Z79891 Long term (current) use of opiate analgesic: Secondary | ICD-10-CM

## 2021-05-05 DIAGNOSIS — D649 Anemia, unspecified: Secondary | ICD-10-CM | POA: Diagnosis not present

## 2021-05-05 DIAGNOSIS — Z9181 History of falling: Secondary | ICD-10-CM

## 2021-05-05 DIAGNOSIS — I2510 Atherosclerotic heart disease of native coronary artery without angina pectoris: Secondary | ICD-10-CM | POA: Diagnosis present

## 2021-05-05 DIAGNOSIS — J9622 Acute and chronic respiratory failure with hypercapnia: Secondary | ICD-10-CM | POA: Diagnosis present

## 2021-05-05 DIAGNOSIS — W19XXXA Unspecified fall, initial encounter: Secondary | ICD-10-CM | POA: Diagnosis present

## 2021-05-05 DIAGNOSIS — Z9049 Acquired absence of other specified parts of digestive tract: Secondary | ICD-10-CM

## 2021-05-05 DIAGNOSIS — J939 Pneumothorax, unspecified: Secondary | ICD-10-CM | POA: Diagnosis present

## 2021-05-05 DIAGNOSIS — Z789 Other specified health status: Secondary | ICD-10-CM

## 2021-05-05 DIAGNOSIS — I480 Paroxysmal atrial fibrillation: Secondary | ICD-10-CM | POA: Diagnosis present

## 2021-05-05 DIAGNOSIS — F32A Depression, unspecified: Secondary | ICD-10-CM | POA: Diagnosis present

## 2021-05-05 DIAGNOSIS — N400 Enlarged prostate without lower urinary tract symptoms: Secondary | ICD-10-CM | POA: Diagnosis present

## 2021-05-05 DIAGNOSIS — I119 Hypertensive heart disease without heart failure: Secondary | ICD-10-CM | POA: Diagnosis present

## 2021-05-05 DIAGNOSIS — E876 Hypokalemia: Secondary | ICD-10-CM | POA: Diagnosis present

## 2021-05-05 DIAGNOSIS — R54 Age-related physical debility: Secondary | ICD-10-CM | POA: Diagnosis present

## 2021-05-05 DIAGNOSIS — Z79899 Other long term (current) drug therapy: Secondary | ICD-10-CM

## 2021-05-05 DIAGNOSIS — E871 Hypo-osmolality and hyponatremia: Secondary | ICD-10-CM | POA: Diagnosis present

## 2021-05-05 DIAGNOSIS — E162 Hypoglycemia, unspecified: Secondary | ICD-10-CM | POA: Diagnosis not present

## 2021-05-05 DIAGNOSIS — E669 Obesity, unspecified: Secondary | ICD-10-CM | POA: Diagnosis present

## 2021-05-05 DIAGNOSIS — J9621 Acute and chronic respiratory failure with hypoxia: Secondary | ICD-10-CM | POA: Diagnosis present

## 2021-05-05 DIAGNOSIS — E7800 Pure hypercholesterolemia, unspecified: Secondary | ICD-10-CM | POA: Diagnosis present

## 2021-05-05 DIAGNOSIS — Z515 Encounter for palliative care: Secondary | ICD-10-CM | POA: Diagnosis not present

## 2021-05-05 DIAGNOSIS — Z7189 Other specified counseling: Secondary | ICD-10-CM | POA: Diagnosis not present

## 2021-05-05 DIAGNOSIS — Z9911 Dependence on respirator [ventilator] status: Secondary | ICD-10-CM | POA: Diagnosis not present

## 2021-05-05 DIAGNOSIS — Z09 Encounter for follow-up examination after completed treatment for conditions other than malignant neoplasm: Secondary | ICD-10-CM

## 2021-05-05 LAB — CBC
HCT: 39.6 % (ref 39.0–52.0)
Hemoglobin: 12.8 g/dL — ABNORMAL LOW (ref 13.0–17.0)
MCH: 31.8 pg (ref 26.0–34.0)
MCHC: 32.3 g/dL (ref 30.0–36.0)
MCV: 98.3 fL (ref 80.0–100.0)
Platelets: 129 10*3/uL — ABNORMAL LOW (ref 150–400)
RBC: 4.03 MIL/uL — ABNORMAL LOW (ref 4.22–5.81)
RDW: 14.5 % (ref 11.5–15.5)
WBC: 9.8 10*3/uL (ref 4.0–10.5)
nRBC: 0 % (ref 0.0–0.2)

## 2021-05-05 LAB — BASIC METABOLIC PANEL
Anion gap: 11 (ref 5–15)
BUN: 12 mg/dL (ref 8–23)
CO2: 34 mmol/L — ABNORMAL HIGH (ref 22–32)
Calcium: 8.9 mg/dL (ref 8.9–10.3)
Chloride: 87 mmol/L — ABNORMAL LOW (ref 98–111)
Creatinine, Ser: 0.94 mg/dL (ref 0.61–1.24)
GFR, Estimated: >60 mL/min (ref >60)
Glucose, Bld: 124 mg/dL — ABNORMAL HIGH (ref 70–99)
Potassium: 3 mmol/L — ABNORMAL LOW (ref 3.5–5.1)
Sodium: 132 mmol/L — ABNORMAL LOW (ref 135–145)

## 2021-05-05 LAB — POCT I-STAT 7, (LYTES, BLD GAS, ICA,H+H)
Acid-Base Excess: 10 mmol/L — ABNORMAL HIGH (ref 0.0–2.0)
Bicarbonate: 37.7 mmol/L — ABNORMAL HIGH (ref 20.0–28.0)
Calcium, Ion: 1.18 mmol/L (ref 1.15–1.40)
HCT: 40 % (ref 39.0–52.0)
Hemoglobin: 13.6 g/dL (ref 13.0–17.0)
O2 Saturation: 100 %
Patient temperature: 36.8
Potassium: 3.6 mmol/L (ref 3.5–5.1)
Sodium: 132 mmol/L — ABNORMAL LOW (ref 135–145)
TCO2: 40 mmol/L — ABNORMAL HIGH (ref 22–32)
pCO2 arterial: 66.6 mmHg (ref 32.0–48.0)
pH, Arterial: 7.361 (ref 7.350–7.450)
pO2, Arterial: 206 mmHg — ABNORMAL HIGH (ref 83.0–108.0)

## 2021-05-05 LAB — TROPONIN I (HIGH SENSITIVITY)
Troponin I (High Sensitivity): 21 ng/L — ABNORMAL HIGH (ref <18)
Troponin I (High Sensitivity): 19 ng/L — ABNORMAL HIGH (ref <18)

## 2021-05-05 LAB — MRSA NEXT GEN BY PCR, NASAL: MRSA by PCR Next Gen: NOT DETECTED

## 2021-05-05 LAB — POC SARS CORONAVIRUS 2 AG -  ED: SARSCOV2ONAVIRUS 2 AG: NEGATIVE

## 2021-05-05 MED ORDER — FENTANYL CITRATE PF 50 MCG/ML IJ SOSY
50.0000 ug | PREFILLED_SYRINGE | INTRAMUSCULAR | Status: DC | PRN
  Administered 2021-05-05: 50 ug via INTRAVENOUS
  Filled 2021-05-05 (×2): qty 1

## 2021-05-05 MED ORDER — ORAL CARE MOUTH RINSE
15.0000 mL | OROMUCOSAL | Status: DC
  Administered 2021-05-05 – 2021-05-09 (×37): 15 mL via OROMUCOSAL

## 2021-05-05 MED ORDER — FENTANYL CITRATE PF 50 MCG/ML IJ SOSY: 50.0000 ug | PREFILLED_SYRINGE | INTRAMUSCULAR | Status: DC | PRN

## 2021-05-05 MED ORDER — SUCCINYLCHOLINE CHLORIDE 200 MG/10ML IV SOSY
120.0000 mg | PREFILLED_SYRINGE | Freq: Once | INTRAVENOUS | Status: AC
  Administered 2021-05-05: 120 mg via INTRAVENOUS

## 2021-05-05 MED ORDER — PROPOFOL 1000 MG/100ML IV EMUL
0.0000 ug/kg/min | INTRAVENOUS | Status: DC
  Administered 2021-05-05: 5 ug/kg/min via INTRAVENOUS
  Administered 2021-05-05: 35 ug/kg/min via INTRAVENOUS
  Administered 2021-05-06: 20 ug/kg/min via INTRAVENOUS
  Administered 2021-05-06: 30 ug/kg/min via INTRAVENOUS
  Administered 2021-05-06: 35 ug/kg/min via INTRAVENOUS
  Administered 2021-05-07: 10 ug/kg/min via INTRAVENOUS
  Administered 2021-05-07: 35 ug/kg/min via INTRAVENOUS
  Filled 2021-05-05 (×7): qty 100

## 2021-05-05 MED ORDER — LIDOCAINE HCL (PF) 1 % IJ SOLN: 30.0000 mL | Freq: Once | INTRAMUSCULAR | Status: DC

## 2021-05-05 MED ORDER — ALBUTEROL SULFATE (2.5 MG/3ML) 0.083% IN NEBU: 2.5000 mg | INHALATION_SOLUTION | Freq: Four times a day (QID) | RESPIRATORY_TRACT | Status: DC | PRN

## 2021-05-05 MED ORDER — ACETAMINOPHEN 325 MG PO TABS: 650.0000 mg | ORAL_TABLET | Freq: Four times a day (QID) | ORAL | Status: DC | PRN

## 2021-05-05 MED ORDER — POLYETHYLENE GLYCOL 3350 17 G PO PACK
17.0000 g | PACK | Freq: Every day | ORAL | Status: DC
  Administered 2021-05-06 – 2021-05-09 (×4): 17 g
  Filled 2021-05-05 (×4): qty 1

## 2021-05-05 MED ORDER — DOXAZOSIN MESYLATE 2 MG PO TABS
2.0000 mg | ORAL_TABLET | Freq: Every day | ORAL | Status: DC
  Administered 2021-05-06: 2 mg
  Filled 2021-05-05 (×2): qty 1

## 2021-05-05 MED ORDER — ETOMIDATE 2 MG/ML IV SOLN
20.0000 mg | Freq: Once | INTRAVENOUS | Status: AC
  Administered 2021-05-05: 20 mg via INTRAVENOUS

## 2021-05-05 MED ORDER — TRAMADOL HCL 50 MG PO TABS: 50.0000 mg | ORAL_TABLET | Freq: Four times a day (QID) | ORAL | Status: DC | PRN

## 2021-05-05 MED ORDER — LIDOCAINE HCL (PF) 1 % IJ SOLN
INTRAMUSCULAR | Status: AC
  Filled 2021-05-05: qty 30

## 2021-05-05 MED ORDER — MORPHINE SULFATE (PF) 4 MG/ML IV SOLN: 4.0000 mg | INTRAVENOUS | Status: DC | PRN

## 2021-05-05 MED ORDER — ENOXAPARIN SODIUM 30 MG/0.3ML IJ SOSY
30.0000 mg | PREFILLED_SYRINGE | Freq: Two times a day (BID) | INTRAMUSCULAR | Status: DC
  Administered 2021-05-06 – 2021-05-07 (×3): 30 mg via SUBCUTANEOUS
  Filled 2021-05-05 (×3): qty 0.3

## 2021-05-05 MED ORDER — CHLORHEXIDINE GLUCONATE 0.12% ORAL RINSE (MEDLINE KIT)
15.0000 mL | Freq: Two times a day (BID) | OROMUCOSAL | Status: DC
  Administered 2021-05-05 – 2021-05-09 (×8): 15 mL via OROMUCOSAL

## 2021-05-05 MED ORDER — METHOCARBAMOL 1000 MG/10ML IJ SOLN
500.0000 mg | Freq: Three times a day (TID) | INTRAVENOUS | Status: DC | PRN
  Filled 2021-05-05: qty 5

## 2021-05-05 MED ORDER — UMECLIDINIUM-VILANTEROL 62.5-25 MCG/INH IN AEPB
1.0000 | INHALATION_SPRAY | Freq: Every day | RESPIRATORY_TRACT | Status: DC
  Filled 2021-05-05: qty 14

## 2021-05-05 MED ORDER — HYDROCHLOROTHIAZIDE 25 MG PO TABS
25.0000 mg | ORAL_TABLET | Freq: Every day | ORAL | Status: DC
  Administered 2021-05-06: 25 mg
  Filled 2021-05-05: qty 1

## 2021-05-05 MED ORDER — SERTRALINE HCL 100 MG PO TABS
100.0000 mg | ORAL_TABLET | Freq: Every day | ORAL | Status: DC
  Administered 2021-05-06 – 2021-05-09 (×4): 100 mg
  Filled 2021-05-05 (×4): qty 1

## 2021-05-05 MED ORDER — POTASSIUM CHLORIDE 10 MEQ/100ML IV SOLN
10.0000 meq | INTRAVENOUS | Status: AC
  Administered 2021-05-05 – 2021-05-06 (×5): 10 meq via INTRAVENOUS
  Filled 2021-05-05: qty 100

## 2021-05-05 MED ORDER — POTASSIUM CHLORIDE IN NACL 20-0.9 MEQ/L-% IV SOLN
INTRAVENOUS | Status: DC
Start: 2021-05-05 — End: 2021-05-07
  Filled 2021-05-05 (×2): qty 1000

## 2021-05-05 MED ORDER — CHLORHEXIDINE GLUCONATE CLOTH 2 % EX PADS
6.0000 | MEDICATED_PAD | Freq: Every day | CUTANEOUS | Status: DC
  Administered 2021-05-06 – 2021-05-10 (×5): 6 via TOPICAL

## 2021-05-05 MED ORDER — FENTANYL BOLUS VIA INFUSION
25.0000 ug | INTRAVENOUS | Status: DC | PRN
  Filled 2021-05-05: qty 100

## 2021-05-05 MED ORDER — IOHEXOL 350 MG/ML SOLN
100.0000 mL | Freq: Once | INTRAVENOUS | Status: AC | PRN
  Administered 2021-05-05: 100 mL via INTRAVENOUS

## 2021-05-05 MED ORDER — DOCUSATE SODIUM 50 MG/5ML PO LIQD
100.0000 mg | Freq: Two times a day (BID) | ORAL | Status: DC
  Administered 2021-05-05 – 2021-05-09 (×8): 100 mg
  Filled 2021-05-05 (×9): qty 10

## 2021-05-05 MED ORDER — FENTANYL CITRATE (PF) 100 MCG/2ML IJ SOLN: 25.0000 ug | Freq: Once | INTRAMUSCULAR | Status: DC

## 2021-05-05 MED ORDER — FENTANYL 2500MCG IN NS 250ML (10MCG/ML) PREMIX INFUSION
25.0000 ug/h | INTRAVENOUS | Status: DC
  Administered 2021-05-05: 25 ug/h via INTRAVENOUS
  Administered 2021-05-06: 75 ug/h via INTRAVENOUS
  Filled 2021-05-05 (×2): qty 250

## 2021-05-05 MED ORDER — FENTANYL CITRATE PF 50 MCG/ML IJ SOSY
50.0000 ug | PREFILLED_SYRINGE | Freq: Once | INTRAMUSCULAR | Status: AC
  Administered 2021-05-05: 50 ug via INTRAVENOUS
  Filled 2021-05-05: qty 1

## 2021-05-05 MED ORDER — ALBUTEROL (5 MG/ML) CONTINUOUS INHALATION SOLN
10.0000 mg/h | INHALATION_SOLUTION | Freq: Once | RESPIRATORY_TRACT | Status: DC
  Filled 2021-05-05: qty 20

## 2021-05-05 MED ORDER — FENTANYL CITRATE (PF) 100 MCG/2ML IJ SOLN
50.0000 ug | INTRAMUSCULAR | Status: DC | PRN
  Filled 2021-05-05: qty 2

## 2021-05-05 NOTE — ED Provider Notes (Signed)
  2:50 PM CHEST TUBE INSERTION  Date/Time: 05/08/2021 2:50 PM Performed by: Lianne Cure, DO Authorized by: Lianne Cure, DO   Consent:    Consent obtained:  Verbal and written   Consent given by:  Patient   Risks, benefits, and alternatives were discussed: yes     Risks discussed:  Bleeding, damage to surrounding structures, incomplete drainage and pain   Alternatives discussed:  No treatment Universal protocol:    Procedure explained and questions answered to patient or proxy's satisfaction: yes     Patient identity confirmed:  Verbally with patient Procedure details:    Tube size (Fr):  32   Dissection instrument:  Kelly clamp   Ultrasound guidance: no     Tension pneumothorax: yes     Tube connected to:  Suction   Dressing:  4x4 sterile gauze Post-procedure details:    Post-insertion x-ray findings: tube repositioned     Procedure completion:  Tolerated .Critical Care  Date/Time: 04/09/2021 2:51 PM Performed by: Lianne Cure, DO Authorized by: Lianne Cure, DO   Critical care provider statement:    Critical care time (minutes):  68   Critical care time was exclusive of:  Separately billable procedures and treating other patients   Critical care was necessary to treat or prevent imminent or life-threatening deterioration of the following conditions:  Respiratory failure   Critical care was time spent personally by me on the following activities:  Development of treatment plan with patient or surrogate, discussions with consultants, discussions with primary provider, evaluation of patient's response to treatment, examination of patient, ordering and performing treatments and interventions, ordering and review of radiographic studies, pulse oximetry, re-evaluation of patient's condition and review of old charts   I assumed direction of critical care for this patient from another provider in my specialty: yes      Lianne Cure, DO [phone]

## 2021-05-05 NOTE — Progress Notes (Signed)
Patient ID: [name], male   DOB: 10/04/1933, 87 y.o.   MRN: [id number] Remains sleepy and has very elevated PaCO2. EDP intubating. Orders adjusted.  Georganna Skeans, MD, MPH, FACS Please use [email or web address] to contact on call provider

## 2021-05-05 NOTE — Progress Notes (Signed)
Patient intubated due to LOC, increased CO2, good color change on ETCO2 detector

## 2021-05-05 NOTE — Progress Notes (Signed)
eLink Physician-Brief Progress Note Patient Name: [name] DOB: June 24, 1934 MRN: [id number]   Date of Service  04/27/2021  HPI/Events of Note  K at 3, Cr normal, only has PIV.  eICU Interventions  Getting kcl in maintenance fluids already.  Potassium replacement protocol- total 50 meq IV instead 60 meq that is recommended. Follow lytes in AM. Magnesium in am.      Intervention Category Intermediate Interventions: Electrolyte abnormality - evaluation and management  Elmer Sow 04/24/2021, 7:24 PM

## 2021-05-05 NOTE — ED Notes (Signed)
Pt has a large blister under right eye. Pt has ecchymosis of the left outer arm from forearm to upper arm. Pt has bandage on left elbow.

## 2021-05-05 NOTE — H&P (Signed)
[name] is an 87 y.o. male.   Chief Complaint: fall 8/24, SOB HPI: 87yo M with multiple medical problems as below fell 8/24. He was unable to get up for several hours. After that, he was sore. He has gradually developed worsening SOB and L chest pain. He came to the ED and was found to have a large L PTX. His sats were low so the EDP emeregently placed a chest tube. He then underwent further imaging. I was asked to see for admission. He received some medicine for his chest tueb and remains sleepy and cannot give any history. His wife and daughter are bedside.  Past Medical History:  Diagnosis Date   AAA (abdominal aortic aneurysm) ([place])    [place] 03/2021: 3.2 cm   Afib (HCC)    Arthritis    BPH (benign prostatic hyperplasia)    COPD (chronic obstructive pulmonary disease) (HCC)    Coronary artery disease    Coronary artery ectasia    Mild CAD with normal systolic function per cath in August of 2011   Depression    Diaphragmatic paralysis    felt to be partially responsible for dyspnea   Dyspnea    due to paralyzed diaphragm and pulmonary issues   Dysrhythmia    GERD (gastroesophageal reflux disease)    Gout    HTN (hypertension)    Hypercholesteremia    Hypertension    Iron deficiency anemia    Microhematuria    Obesity    OSA (obstructive sleep apnea)    CPAP   Paroxysmal atrial fibrillation ([place])    occured in the setting of acute E Coli sepsis and ileius 7/12   PVC (premature ventricular contraction)    s/p PVC ablation 05/29/2010    Past Surgical History:  Procedure Laterality Date   APPENDECTOMY  1978   BIOPSY  02/09/2019   Procedure: BIOPSY;  Surgeon: Clarene Essex, MD;  Location: [facility];  Service: Endoscopy;;   BIOPSY  03/02/2019   Procedure: BIOPSY;  Surgeon: Clarene Essex, MD;  Location: [facility];  Service: Endoscopy;;   CARDIAC CATHETERIZATION  04-30-2010   Left main coronary artery is normal.    CARDIOVASCULAR STRESS TEST  03-19-2010   0%    CHOLECYSTECTOMY  1990   COLONOSCOPY N/A 03/02/2019   Procedure: COLONOSCOPY;  Surgeon: Clarene Essex, MD;  Location: [facility];  Service: Endoscopy;  Laterality: N/A;   COLONOSCOPY W/ POLYPECTOMY     ESOPHAGOGASTRODUODENOSCOPY (EGD) WITH PROPOFOL N/A 02/09/2019   Procedure: ESOPHAGOGASTRODUODENOSCOPY (EGD) WITH PROPOFOL;  Surgeon: Clarene Essex, MD;  Location: [facility];  Service: Endoscopy;  Laterality: N/A;   EYE SURGERY Right    catarct   poly removed from nose as a child     PVC ablation  05/29/2010   RADIOLOGY WITH ANESTHESIA N/A 11/07/2020   Procedure: MRV HEAD WITH AND WITHOUT;  Surgeon: Radiologist, Medication, MD;  Location: [facility];  Service: Radiology;  Laterality: N/A;   RADIOLOGY WITH ANESTHESIA N/A 11/07/2020   Procedure: CT LUMBAR SPINE WITHOUT;  Surgeon: Radiologist, Medication, MD;  Location: [facility];  Service: Radiology;  Laterality: N/A;   US ECHOCARDIOGRAPHY  03-09-2010   EF 55-60%    Family History  Problem Relation Age of Onset   Aneurysm Father 70       brain aneurysm   Breast cancer Mother    Breast cancer Daughter    Social History:  reports that he quit smoking about 37 years ago. His smoking use included cigarettes. He has  never used smokeless tobacco. He reports current alcohol use of about 7.0 standard drinks per week. He reports that he does not use drugs.  Allergies: No Known Allergies  (Not in a hospital admission)   Results for orders placed or performed during the hospital encounter of 04/15/2021 (from the past 48 hour(s))  Basic metabolic panel     Status: Abnormal   Collection Time: 04/27/2021 12:25 PM  Result Value Ref Range   Sodium 132 (L) 135 - 145 mmol/L   Potassium 3.0 (L) 3.5 - 5.1 mmol/L   Chloride 87 (L) 98 - 111 mmol/L   CO2 34 (H) 22 - 32 mmol/L   Glucose, Bld 124 (H) 70 - 99 mg/dL    Comment: Glucose reference range applies only to samples taken after fasting for at least 8 hours.   BUN 12 8 - 23 mg/dL   Creatinine, Ser 0.94 0.61 - 1.24 mg/dL    Calcium 8.9 8.9 - 10.3 mg/dL   GFR, Estimated >60 >60 mL/min    Comment: (NOTE) Calculated using the CKD-EPI Creatinine Equation (2021)    Anion gap 11 5 - 15    Comment: Performed at [facility] [address], [place], [place] [address]  CBC     Status: Abnormal   Collection Time: 04/19/2021 12:25 PM  Result Value Ref Range   WBC 9.8 4.0 - 10.5 K/uL   RBC 4.03 (L) 4.22 - 5.81 MIL/uL   Hemoglobin 12.8 (L) 13.0 - 17.0 g/dL   HCT 39.6 39.0 - 52.0 %   MCV 98.3 80.0 - 100.0 fL   MCH 31.8 26.0 - 34.0 pg   MCHC 32.3 30.0 - 36.0 g/dL   RDW 14.5 11.5 - 15.5 %   Platelets 129 (L) 150 - 400 K/uL    Comment: REPEATED TO VERIFY   nRBC 0.0 0.0 - 0.2 %    Comment: Performed at [facility], [place] [address], [place], [place] [address]  Troponin I (High Sensitivity)     Status: Abnormal   Collection Time: 04/15/2021 12:25 PM  Result Value Ref Range   Troponin I (High Sensitivity) 21 (H) <18 ng/L    Comment: (NOTE) Elevated high sensitivity troponin I (hsTnI) values and significant  changes across serial measurements may suggest ACS but many other  chronic and acute conditions are known to elevate hsTnI results.  Refer to the "Links" section for chest pain algorithms and additional  guidance. Performed at [facility], [place] [address], [place], [place] [address]   POC SARS Coronavirus 2 Ag-ED - Nasal Swab     Status: None   Collection Time: 04/25/2021 12:39 PM  Result Value Ref Range   SARSCOV2ONAVIRUS 2 AG NEGATIVE NEGATIVE    Comment: (NOTE) SARS-CoV-2 antigen NOT DETECTED.   Negative results are presumptive.  Negative results do not preclude SARS-CoV-2 infection and should not be used as the sole basis for treatment or other patient management decisions, including infection  control decisions, particularly in the presence of clinical signs and  symptoms consistent with COVID-19, or in those who have been in contact with the virus.  Negative results must be combined  with clinical observations, patient history, and epidemiological information. The expected result is Negative.  Fact Sheet for Patients: HandmadeRecipes.com.cy  Fact Sheet for Healthcare Providers: FuneralLife.at  This test is not yet approved or cleared by the [place] FDA and  has been authorized for detection and/or diagnosis of SARS-CoV-2 by FDA under an  Emergency Use Authorization (EUA).  This EUA will remain in effect (meaning this test can be used) for the duration of  the COV ID-19 declaration under Section 564(b)(1) of the Act, 21 U.S.C. section 360bbb-3(b)(1), unless the authorization is terminated or revoked sooner.     CT HEAD WO CONTRAST (5MM)  Result Date: 04/30/2021 CLINICAL DATA:  Head trauma, minor (Age >= 65y) EXAM: CT HEAD WITHOUT CONTRAST CT CERVICAL SPINE WITHOUT CONTRAST TECHNIQUE: Multidetector CT imaging of the head and cervical spine was performed following the standard protocol without intravenous contrast. Multiplanar CT image reconstructions of the cervical spine were also generated. COMPARISON:  August 15, 2020 FINDINGS: Evaluation is limited secondary to motion. CT HEAD FINDINGS Brain: No evidence of acute infarction, hemorrhage, hydrocephalus, extra-axial collection or mass lesion/mass effect. Periventricular white matter hypodensities consistent with sequela of chronic microvascular ischemic disease. Vascular: Vascular calcifications. Skull: No acute fracture. Sinuses/Orbits: No acute finding. Other: None. CT CERVICAL SPINE FINDINGS Alignment: Normal. Skull base and vertebrae: No acute vertebral body fracture. There is new lucency through an inferior endplate osteophyte of C6 since prior in 2021. Soft tissues and spinal canal: No prevertebral fluid or swelling. No visible canal hematoma. Disc levels: Multilevel endplate proliferative changes. Relative preservation of the disc spaces. Mild bilateral facet  arthropathy. Upper chest: Similar appearance of LEFT apical scarring. Other: None. IMPRESSION: 1.  No acute intracranial abnormality. 2. There is new lucency through an inferior endplate osteophyte of C6. Findings may reflect an osteophyte fracture, age indeterminate, which could reflect a source of pain. Recommend correlation with clinical symptomatology. This could be further assessed with dedicated MRI if clinically indicated. Otherwise no evidence of acute cervical spine fracture. Electronically Signed   By: Valentino Saxon M.D.   On: 04/09/2021 15:54   CT Chest W Contrast  Result Date: 04/26/2021 CLINICAL DATA:  Status post trauma with new chest tube. EXAM: CT CHEST, ABDOMEN, AND PELVIS WITH CONTRAST TECHNIQUE: Multidetector CT imaging of the chest, abdomen and pelvis was performed following the standard protocol during bolus administration of intravenous contrast. CONTRAST:  119m OMNIPAQUE IOHEXOL 350 MG/ML SOLN COMPARISON:  Chest x-ray 05/04/2021. CT chest 01/06/2019, chest x-ray 05/03/2021. CT abdomen pelvis 07/25/2005 FINDINGS: CHEST: Ports and Devices: Left inferolateral chest tube noted within the left pleural space with tip terminating at the apex. High density debris (41 Hounsfield units) is noted within the chest tube most distally along chest tube holes) 19:70, 14:41). Lungs/airways: Passive atelectasis of the left lower lobe with partial collapse of left lower lobe. Similar passive atelectasis and partial collapse of the right lower lobe. No focal consolidation. No pulmonary nodule. No pulmonary mass. No pulmonary contusion or laceration. No pneumatocele formation. The central airways are patent. Pleura: Persistent trace to small volume left pneumothorax and associated trace to small volume pleural fluid measuring up to 37 Hounsfield possible hemothorax. No definite hemothorax or pneumothorax on the right. Simple free fluid trace volume right pleural effusion Lymph Nodes: No mediastinal,  hilar, or axillary lymphadenopathy. Mediastinum: Chronic elevated left hemidiaphragm with chronic left to right mediastinal shift. No pneumomediastinum. No aortic injury or mediastinal hematoma. The thoracic aorta is normal in caliber. Moderate calcified and noncalcified atherosclerotic plaque. Four-vessel coronary artery calcifications. Mild prominence of the heart. No significant pericardial effusion. The esophagus is unremarkable. The thyroid is unremarkable. Chest Wall / Breasts: Mild subcutaneus soft tissue edema and emphysema along the left chest wall likely due to chest tube insertion. No chest wall mass. Musculoskeletal: Likely old healed nondisplaced right rib  fractures. No acute displaced rib or sternal fracture. No thoracic spinal fracture. Please see separately in dictated CT cervical spine 04/23/2021. ABDOMEN / PELVIS: Liver: Similar-appearing several scattered subcentimeter hepatic hypodensities are too small to characterize. Similar-appearing 2.1 cm fluid density subcapsular lesion along the inferior right hepatic lobe (19:58). No new focal lesion. No laceration or subcapsular hematoma. Biliary System: Status post cholecystectomy. No intrahepatic biliary ductal dilatation. Prominent common bile duct in the setting of post cholecystectomy which appears stable compared to prior. Pancreas: Stable 1.3 cm fluid density lesion within the proximal pancreatic body (13:64). No new pancreatic lesion. Normal pancreatic contour. No main pancreatic duct dilatation. Spleen: Not enlarged. No focal lesion. No laceration, subcapsular hematoma, or vascular injury. Adrenal Glands: Bilateral adrenal gland hyperplasia with no nodularity. Kidneys: Bilateral kidneys enhance symmetrically. No hydronephrosis. No contusion, laceration, or subcapsular hematoma. Several fluid density lesions within the kidneys likely represent simple renal cysts and appear grossly stable. There is a stable appearing (as far back as 2006) 2.3 cm  hyperdense lesion within the right kidney with a density of 52 Hounsfield units. No injury to the vascular structures or collecting systems. No hydroureter. The urinary bladder is mildly distended with urine and grossly unremarkable. Bowel: No small or large bowel wall thickening or dilatation. Redundant sigmoid colon. The appendix not definitely identified with no right lower quadrant inflammatory changes. Mesentery, Omentum, and Peritoneum: No simple free fluid ascites. No pneumoperitoneum. No hemoperitoneum. No mesenteric hematoma identified. No organized fluid collection. Pelvic Organs: Enlarged prostate measuring up to 5.9 cm. Lymph Nodes: No abdominal, pelvic, inguinal lymphadenopathy. Vasculature: Stable infrarenal aneurysmal aorta measuring up to 3.3 cm in caliber on axial imaging (13:77). The common iliac arteries measure at the upper limits of normal (1.5 cm (13:93). Atherosclerotic plaque. No active contrast extravasation or pseudoaneurysm. Musculoskeletal: No significant soft tissue hematoma.  Diffuse muscular atrophy. No acute pelvic fracture. No spinal fracture. IMPRESSION: 1. Persistent trace to small volume left hemopneumothorax status post left chest tube placement that appears in appropriate position with debris noted within its distal lumen. No definite tension component with chronic left to right mediastinal shift due to chronic elevated left hemidiaphragm. 2. Trace right pleural effusion. 3. No acute traumatic injury to the abdomen or pelvis. 4. No acute fracture or traumatic malalignment of the thoracic or lumbar spine. 5. Please see separately in dictated CT head and cervical spine 05/04/2021. Other imaging findings of potential clinical significance: 1. Cardiomegaly. 2. Prostatomegaly. 3. Stable 1.3 cm fluid density lesion within the proximal pancreatic body. Recommend follow up pre and post contrast MRI/MRCP or pancreatic protocol CT in 2 years. This recommendation follows ACR consensus  guidelines: Management of Incidental Pancreatic Cysts: A White Paper of the ACR Incidental Findings Committee. [place] [id number]. 4. Stable in size and appearance of a 2.3 cm right renal lesion (as far back as 2006). Given stability, finding likely represents a hemorrhagic/proteinaceous cyst. 5. Aortic aneurysm NOS (ICD10-I71.9). Stable infrarenal abdominal aorta aneurysm (3.3 cm) Recommend follow-up ultrasound every 3 years. This recommendation follows ACR consensus guidelines: White Paper of the ACR Incidental Findings Committee II on Vascular Findings. J Am Coll Radiol [id number]. 6. Aortic Atherosclerosis (ICD10-I70.0) including four-vessel coronary artery calcifications. Electronically Signed   By: Iven Finn M.D.   On: 04/09/2021 16:09   CT Cervical Spine Wo Contrast  Result Date: 04/17/2021 CLINICAL DATA:  Head trauma, minor (Age >= 65y) EXAM: CT HEAD WITHOUT CONTRAST CT CERVICAL SPINE WITHOUT CONTRAST TECHNIQUE: Multidetector CT imaging of  the head and cervical spine was performed following the standard protocol without intravenous contrast. Multiplanar CT image reconstructions of the cervical spine were also generated. COMPARISON:  August 15, 2020 FINDINGS: Evaluation is limited secondary to motion. CT HEAD FINDINGS Brain: No evidence of acute infarction, hemorrhage, hydrocephalus, extra-axial collection or mass lesion/mass effect. Periventricular white matter hypodensities consistent with sequela of chronic microvascular ischemic disease. Vascular: Vascular calcifications. Skull: No acute fracture. Sinuses/Orbits: No acute finding. Other: None. CT CERVICAL SPINE FINDINGS Alignment: Normal. Skull base and vertebrae: No acute vertebral body fracture. There is new lucency through an inferior endplate osteophyte of C6 since prior in 2021. Soft tissues and spinal canal: No prevertebral fluid or swelling. No visible canal hematoma. Disc levels: Multilevel endplate proliferative  changes. Relative preservation of the disc spaces. Mild bilateral facet arthropathy. Upper chest: Similar appearance of LEFT apical scarring. Other: None. IMPRESSION: 1.  No acute intracranial abnormality. 2. There is new lucency through an inferior endplate osteophyte of C6. Findings may reflect an osteophyte fracture, age indeterminate, which could reflect a source of pain. Recommend correlation with clinical symptomatology. This could be further assessed with dedicated MRI if clinically indicated. Otherwise no evidence of acute cervical spine fracture. Electronically Signed   By: Valentino Saxon M.D.   On: 05/06/2021 15:54   CT ABDOMEN PELVIS W CONTRAST  Result Date: 04/12/2021 CLINICAL DATA:  Status post trauma with new chest tube. EXAM: CT CHEST, ABDOMEN, AND PELVIS WITH CONTRAST TECHNIQUE: Multidetector CT imaging of the chest, abdomen and pelvis was performed following the standard protocol during bolus administration of intravenous contrast. CONTRAST:  184m OMNIPAQUE IOHEXOL 350 MG/ML SOLN COMPARISON:  Chest x-ray 04/23/2021. CT chest 01/06/2019, chest x-ray 04/17/2021. CT abdomen pelvis 07/25/2005 FINDINGS: CHEST: Ports and Devices: Left inferolateral chest tube noted within the left pleural space with tip terminating at the apex. High density debris (41 Hounsfield units) is noted within the chest tube most distally along chest tube holes) 19:70, 14:41). Lungs/airways: Passive atelectasis of the left lower lobe with partial collapse of left lower lobe. Similar passive atelectasis and partial collapse of the right lower lobe. No focal consolidation. No pulmonary nodule. No pulmonary mass. No pulmonary contusion or laceration. No pneumatocele formation. The central airways are patent. Pleura: Persistent trace to small volume left pneumothorax and associated trace to small volume pleural fluid measuring up to 37 Hounsfield possible hemothorax. No definite hemothorax or pneumothorax on the right. Simple  free fluid trace volume right pleural effusion Lymph Nodes: No mediastinal, hilar, or axillary lymphadenopathy. Mediastinum: Chronic elevated left hemidiaphragm with chronic left to right mediastinal shift. No pneumomediastinum. No aortic injury or mediastinal hematoma. The thoracic aorta is normal in caliber. Moderate calcified and noncalcified atherosclerotic plaque. Four-vessel coronary artery calcifications. Mild prominence of the heart. No significant pericardial effusion. The esophagus is unremarkable. The thyroid is unremarkable. Chest Wall / Breasts: Mild subcutaneus soft tissue edema and emphysema along the left chest wall likely due to chest tube insertion. No chest wall mass. Musculoskeletal: Likely old healed nondisplaced right rib fractures. No acute displaced rib or sternal fracture. No thoracic spinal fracture. Please see separately in dictated CT cervical spine 04/18/2021. ABDOMEN / PELVIS: Liver: Similar-appearing several scattered subcentimeter hepatic hypodensities are too small to characterize. Similar-appearing 2.1 cm fluid density subcapsular lesion along the inferior right hepatic lobe (19:58). No new focal lesion. No laceration or subcapsular hematoma. Biliary System: Status post cholecystectomy. No intrahepatic biliary ductal dilatation. Prominent common bile duct in the setting of post cholecystectomy which  appears stable compared to prior. Pancreas: Stable 1.3 cm fluid density lesion within the proximal pancreatic body (13:64). No new pancreatic lesion. Normal pancreatic contour. No main pancreatic duct dilatation. Spleen: Not enlarged. No focal lesion. No laceration, subcapsular hematoma, or vascular injury. Adrenal Glands: Bilateral adrenal gland hyperplasia with no nodularity. Kidneys: Bilateral kidneys enhance symmetrically. No hydronephrosis. No contusion, laceration, or subcapsular hematoma. Several fluid density lesions within the kidneys likely represent simple renal cysts and  appear grossly stable. There is a stable appearing (as far back as 2006) 2.3 cm hyperdense lesion within the right kidney with a density of 52 Hounsfield units. No injury to the vascular structures or collecting systems. No hydroureter. The urinary bladder is mildly distended with urine and grossly unremarkable. Bowel: No small or large bowel wall thickening or dilatation. Redundant sigmoid colon. The appendix not definitely identified with no right lower quadrant inflammatory changes. Mesentery, Omentum, and Peritoneum: No simple free fluid ascites. No pneumoperitoneum. No hemoperitoneum. No mesenteric hematoma identified. No organized fluid collection. Pelvic Organs: Enlarged prostate measuring up to 5.9 cm. Lymph Nodes: No abdominal, pelvic, inguinal lymphadenopathy. Vasculature: Stable infrarenal aneurysmal aorta measuring up to 3.3 cm in caliber on axial imaging (13:77). The common iliac arteries measure at the upper limits of normal (1.5 cm (13:93). Atherosclerotic plaque. No active contrast extravasation or pseudoaneurysm. Musculoskeletal: No significant soft tissue hematoma.  Diffuse muscular atrophy. No acute pelvic fracture. No spinal fracture. IMPRESSION: 1. Persistent trace to small volume left hemopneumothorax status post left chest tube placement that appears in appropriate position with debris noted within its distal lumen. No definite tension component with chronic left to right mediastinal shift due to chronic elevated left hemidiaphragm. 2. Trace right pleural effusion. 3. No acute traumatic injury to the abdomen or pelvis. 4. No acute fracture or traumatic malalignment of the thoracic or lumbar spine. 5. Please see separately in dictated CT head and cervical spine 05/07/2021. Other imaging findings of potential clinical significance: 1. Cardiomegaly. 2. Prostatomegaly. 3. Stable 1.3 cm fluid density lesion within the proximal pancreatic body. Recommend follow up pre and post contrast MRI/MRCP or  pancreatic protocol CT in 2 years. This recommendation follows ACR consensus guidelines: Management of Incidental Pancreatic Cysts: A White Paper of the ACR Incidental Findings Committee. [place] [id number]. 4. Stable in size and appearance of a 2.3 cm right renal lesion (as far back as 2006). Given stability, finding likely represents a hemorrhagic/proteinaceous cyst. 5. Aortic aneurysm NOS (ICD10-I71.9). Stable infrarenal abdominal aorta aneurysm (3.3 cm) Recommend follow-up ultrasound every 3 years. This recommendation follows ACR consensus guidelines: White Paper of the ACR Incidental Findings Committee II on Vascular Findings. J Am Coll Radiol [id number]. 6. Aortic Atherosclerosis (ICD10-I70.0) including four-vessel coronary artery calcifications. Electronically Signed   By: Iven Finn M.D.   On: 04/26/2021 16:09   DG Chest Portable 1 View  Result Date: 04/23/2021 CLINICAL DATA:  Chest tube decompression. EXAM: PORTABLE CHEST 1 VIEW COMPARISON:  Three chest x-rays from earlier May 05, 2021. FINDINGS: The left chest tube is been advanced. A second catheter is now projected over the left chest. The left-sided pneumothorax which is large is unchanged. The cardiomediastinal silhouette is stable. Mild effusion and opacity on the right is stable. No other changes. IMPRESSION: 1. The left chest tube has been advanced and a second catheter projects over the left hemithorax. No change in the large pneumothorax. Electronically Signed   By: Dorise Bullion III M.D.   On: 04/20/2021 13:55  DG Chest Portable 1 View  Result Date: 04/12/2021 CLINICAL DATA:  Chest tube repositioning. EXAM: PORTABLE CHEST 1 VIEW COMPARISON:  Chest x-ray from same day at 1321 hours. FINDINGS: The patient is rotated to the right. Stable cardiomediastinal silhouette with rightward tracheal deviation. Interval advancement of the left chest tube with unchanged large left pneumothorax. Unchanged small right  pleural effusion with adjacent basilar atelectasis. IMPRESSION: 1. Interval advancement of the left chest tube with unchanged large left pneumothorax. Electronically Signed   By: Titus Dubin M.D.   On: 04/13/2021 13:47   DG Chest Portable 1 View  Result Date: 05/01/2021 CLINICAL DATA:  Post chest tube placement. EXAM: PORTABLE CHEST 1 VIEW COMPARISON:  Chest x-ray from same day at 1228 hours. FINDINGS: The patient is rotated to the right. Stable cardiomediastinal silhouette with rightward tracheal deviation. New left-sided chest tube noted with unchanged large left pneumothorax. Unchanged small right pleural effusion and right basilar atelectasis. IMPRESSION: 1. New left-sided chest tube with unchanged large left pneumothorax. 2. Unchanged small right pleural effusion and right basilar atelectasis. Electronically Signed   By: Titus Dubin M.D.   On: 04/15/2021 13:45   DG Chest Portable 1 View  Addendum Date: 04/25/2021   ADDENDUM REPORT: 05/04/2021 12:46 ADDENDUM: These results were called by telephone on 04/10/2021 at 12:46 pm to [place], who verbally acknowledged these results. Electronically Signed   By: Valentino Saxon M.D.   On: 04/22/2021 12:46   Result Date: 04/14/2021 CLINICAL DATA:  sob EXAM: PORTABLE CHEST 1 VIEW COMPARISON:  August 15, 2020 . FINDINGS: The cardiomediastinal silhouette is unchanged in contour.Small RIGHT pleural effusion. No LEFT pleural effusion. There is a moderate to large LEFT-sided pneumothorax. Moderately increased degree of RIGHT-sided mediastinal shift in comparison to more remote prior. Bibasilar opacities, likely atelectasis. Visualized abdomen is unremarkable. Remote RIGHT-sided rib fractures. IMPRESSION: Moderate to large LEFT-sided pneumothorax with mildly increased RIGHT-sided midline shift which could reflect underlying tension physiology. PRA system was activated at the time of interpretation on 04/14/2021 at 12:39 pm. An addendum will be issued upon  communication of these results. Electronically Signed: By: Valentino Saxon M.D. On: 05/03/2021 12:43    Review of Systems  Unable to perform ROS: Mental status change   Blood pressure 128/76, pulse 84, temperature 98.2 F (36.8 C), temperature source Axillary, resp. rate (!) 26, height '5\' 10"'$  (1.778 m), weight 86.6 kg, SpO2 (!) 89 %. Physical Exam Constitutional:      General: He is not in acute distress. HENT:     Head: Normocephalic.     Mouth/Throat:     Mouth: Mucous membranes are moist.  Eyes:     Pupils: Pupils are equal, round, and reactive to light.  Neck:     Comments: NT Cardiovascular:     Rate and Rhythm: Normal rate and regular rhythm.  Pulmonary:     Effort: Pulmonary effort is normal. No respiratory distress.     Breath sounds: Normal breath sounds. No stridor. No decreased breath sounds, wheezing or rhonchi.     Comments: L chest tube to pleurevac. Intermittent air leak. Chest:     Chest wall: Tenderness present.     Comments: L side Abdominal:     Palpations: Abdomen is soft.     Tenderness: There is no abdominal tenderness. There is no guarding or rebound.  Musculoskeletal:     Comments: No tenderness  Skin:    General: Skin is warm.     Capillary Refill: Capillary refill takes 2  to 3 seconds.  Neurological:     Comments: Sedated post-procedure GCS [id number]  Psychiatric:        Behavior: Behavior is not agitated.     Assessment/Plan Fall 8/24 L PTX - chest tube to water seal Acute on chronic respiratory failure - uses CPAP with O2 when sleeping. Daughter went to get his mask.Home bronchodilators, CXR in AM.  Admit to ICU. I discussed with family - full code.  Zenovia Jarred, MD 05/03/2021, 4:44 PM

## 2021-05-05 NOTE — ED Provider Notes (Signed)
[facility] EMERGENCY DEPARTMENT Provider Note   CSN: [id number] Arrival date & time: 05/01/2021  1155     History Chief Complaint  Patient presents with   Shortness of Breath    [name] is a 87 y.o. male.  The history is provided by the patient. No language interpreter was used.  Shortness of Breath Severity:  Severe Onset quality:  Gradual Timing:  Constant Progression:  Worsening Chronicity:  New Relieved by:  Nothing Worsened by:  Nothing Ineffective treatments:  None tried Risk factors: no recent surgery   Pt brought in by EMS  Pt short of breath.  Pt is reported to have fallen on Wednesday by EMS.      Past Medical History:  Diagnosis Date   AAA (abdominal aortic aneurysm) ([place])    [place] 03/2021: 3.2 cm   Afib (HCC)    Arthritis    BPH (benign prostatic hyperplasia)    COPD (chronic obstructive pulmonary disease) ([place])    Coronary artery disease    Coronary artery ectasia    Mild CAD with normal systolic function per cath in August of 2011   Depression    Diaphragmatic paralysis    felt to be partially responsible for dyspnea   Dyspnea    due to paralyzed diaphragm and pulmonary issues   Dysrhythmia    GERD (gastroesophageal reflux disease)    Gout    HTN (hypertension)    Hypercholesteremia    Hypertension    Iron deficiency anemia    Microhematuria    Obesity    OSA (obstructive sleep apnea)    CPAP   Paroxysmal atrial fibrillation ([place])    occured in the setting of acute E Coli sepsis and ileius 7/12   PVC (premature ventricular contraction)    s/p PVC ablation 05/29/2010    Patient Active Problem List   Diagnosis Date Noted   Neuropathy 02/14/2021   At high risk for injury related to fall 02/14/2021   Muscle atrophy of lower extremity 02/14/2021   AAA (abdominal aortic aneurysm) without rupture ([place]) 11/14/2020   Subdural hematoma (HCC) 08/15/2020   Iron deficiency anemia due to chronic blood loss 01/06/2019    Orthostatic hypotension 01/05/2019    Class: Question of   Normocytic normochromic anemia 01/05/2019   Near syncope 01/05/2019   Chronic respiratory failure with hypoxia ([place]) 06/30/2018   OSA (obstructive sleep apnea) 02/11/2018   Physical deconditioning 08/11/2017   Shortness of breath 10/24/2014   COPD (chronic obstructive pulmonary disease) with emphysema ([place]) 09/01/2014   Fatigue 10/14/2011   Atrial fibrillation ([place]) 05/15/2011   Premature ventricular contractions (PVCs) ([facility]) 02/25/2011   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   PERSISTENT DISORDER INITIATING/MAINTAINING SLEEP 05/10/2010   HYPERCHOLESTEROLEMIA 05/09/2010   GOUT 05/09/2010   OBESITY 05/09/2010   Obstructive sleep apnea 05/09/2010   Essential hypertension 05/09/2010   VENTRICULAR TACHYCARDIA 05/09/2010    Past Surgical History:  Procedure Laterality Date   APPENDECTOMY  1978   BIOPSY  02/09/2019   Procedure: BIOPSY;  Surgeon: Clarene Essex, MD;  Location: [facility];  Service: Endoscopy;;   BIOPSY  03/02/2019   Procedure: BIOPSY;  Surgeon: Clarene Essex, MD;  Location: [facility];  Service: Endoscopy;;   CARDIAC CATHETERIZATION  04-30-2010   Left main coronary artery is normal.    CARDIOVASCULAR STRESS TEST  03-19-2010   0%   CHOLECYSTECTOMY  1990   COLONOSCOPY N/A 03/02/2019   Procedure: COLONOSCOPY;  Surgeon: Clarene Essex, MD;  Location: [facility];  Service: Endoscopy;  Laterality: N/A;   COLONOSCOPY W/ POLYPECTOMY     ESOPHAGOGASTRODUODENOSCOPY (EGD) WITH PROPOFOL N/A 02/09/2019   Procedure: ESOPHAGOGASTRODUODENOSCOPY (EGD) WITH PROPOFOL;  Surgeon: Clarene Essex, MD;  Location: [facility];  Service: Endoscopy;  Laterality: N/A;   EYE SURGERY Right    catarct   poly removed from nose as a child     PVC ablation  05/29/2010   RADIOLOGY WITH ANESTHESIA N/A 11/07/2020   Procedure: MRV HEAD WITH AND WITHOUT;  Surgeon: Radiologist, Medication, MD;  Location: [place];  Service: Radiology;  Laterality: N/A;   RADIOLOGY  WITH ANESTHESIA N/A 11/07/2020   Procedure: CT LUMBAR SPINE WITHOUT;  Surgeon: Radiologist, Medication, MD;  Location: [place];  Service: Radiology;  Laterality: N/A;   US ECHOCARDIOGRAPHY  03-09-2010   EF 55-60%       Family History  Problem Relation Age of Onset   Aneurysm Father 90       brain aneurysm   Breast cancer Mother    Breast cancer Daughter     Social History   Tobacco Use   Smoking status: Former    Types: Cigarettes    Quit date: 1985    Years since quitting: 37.6   Smokeless tobacco: Never  Vaping Use   Vaping Use: Never used  Substance Use Topics   Alcohol use: Yes    Alcohol/week: 7.0 standard drinks    Types: 7 Standard drinks or equivalent per week    Comment: 1 drink per day    Drug use: Never    Home Medications Prior to Admission medications   Medication Sig Start Date End Date Taking? Authorizing Provider  acetaminophen (TYLENOL) 325 MG tablet Take 2 tablets (650 mg total) by mouth every 6 (six) hours as needed for mild pain. 08/17/20   Meuth, Brooke A, PA-C  albuterol (VENTOLIN HFA) 108 (90 Base) MCG/ACT inhaler Inhale 2 puffs into the lungs every 6 (six) hours as needed for wheezing or shortness of breath. 05/03/21   Olalere, Adewale A, MD  allopurinol (ZYLOPRIM) 300 MG tablet Take 300 mg by mouth daily. 06/02/20   [provider]  ANORO ELLIPTA 62.5-25 MCG/INH AEPB Inhale 1 puff into the lungs daily. 06/17/20   [provider]  B-12 MICROLOZENGE 500 MCG SUBL Place 1 tablet under the tongue daily. 12/08/20   [provider]  buPROPion (WELLBUTRIN SR) 100 MG 12 hr tablet Take 100 mg by mouth 2 (two) times daily.  06/16/20   [provider]  doxazosin (CARDURA) 4 MG tablet Take 0.5 tablets (2 mg total) by mouth at bedtime. 04/19/20   Burtis Junes, NP  ferrous sulfate 325 (65 FE) MG tablet Take 325 mg by mouth daily with breakfast.    [provider]  hydrALAZINE (APRESOLINE) 25 MG tablet Take 1 tablet (25 mg  total) by mouth as needed (If pressure is 160/95 take one tablet.). 11/17/20 02/20/21  Richardson Dopp T, PA-C  hydrochlorothiazide (HYDRODIURIL) 25 MG tablet Take 1 tablet (25 mg total) by mouth daily. 02/22/21   Richardson Dopp T, PA-C  lovastatin (MEVACOR) 40 MG tablet Take 40 mg by mouth daily. 07/31/20   [provider]  OXYGEN Inhale 2 L/min into the lungs daily as needed (SOB).     [provider]  pantoprazole (PROTONIX) 40 MG tablet Take 40 mg by mouth daily. 07/28/20   [provider]  potassium chloride SA (KLOR-CON) 20 MEQ tablet First day take two tablets by mouth ( 40 meq)  than only one tablet by mouth (20 meq) daily. 02/21/21   Richardson Dopp T, PA-C  sertraline (ZOLOFT) 100 MG tablet Take 100 mg by mouth daily. 06/19/20   [provider]  traZODone (DESYREL) 100 MG tablet Take 100 mg by mouth at bedtime.  06/12/20   [provider]    Allergies    Patient has no known allergies.  Review of Systems   Review of Systems  Unable to perform ROS: Acuity of condition  Respiratory:  Positive for shortness of breath.   All other systems reviewed and are negative.  Physical Exam Updated Vital Signs BP (!) 175/89   Pulse 89   Temp 98.2 F (36.8 C) (Axillary)   Resp (!) 26   Ht '5\' 10"'$  (1.778 m)   Wt 86.6 kg   SpO2 98%   BMI 27.39 kg/m   Physical Exam Vitals reviewed.  Cardiovascular:     Rate and Rhythm: Normal rate.  Pulmonary:     Effort: Pulmonary effort is normal.     Breath sounds: Examination of the right-upper field reveals decreased breath sounds. Examination of the right-middle field reveals decreased breath sounds. Examination of the right-lower field reveals decreased breath sounds. Decreased breath sounds present.  Chest:     Chest wall: No mass or deformity.  Abdominal:     Palpations: Abdomen is soft.     Tenderness: There is no abdominal tenderness.  Musculoskeletal:     Cervical back: Normal range of motion.  Skin:     General: Skin is warm.  Neurological:     General: No focal deficit present.     Mental Status: He is alert.  Psychiatric:        Mood and Affect: Mood is anxious.    ED Results / Procedures / Treatments   Labs (all labs ordered are listed, but only abnormal results are displayed) Labs Reviewed  BASIC METABOLIC PANEL  CBC  POC SARS CORONAVIRUS 2 AG -  ED  POC SARS CORONAVIRUS 2 AG -  ED  TROPONIN I (HIGH SENSITIVITY)    EKG None  Radiology No results found.  Procedures .Critical Care  Date/Time: 05/07/2021 2:05 PM Performed by: Fransico Meadow, PA-C Authorized by: Fransico Meadow, PA-C   Critical care provider statement:    Critical care time (minutes):  45   Critical care start time:  05/06/2021 11:00 AM   Critical care end time:  04/18/2021 2:05 PM   Critical care was necessary to treat or prevent imminent or life-threatening deterioration of the following conditions:  Circulatory failure   Critical care was time spent personally by me on the following activities:  Discussions with consultants, evaluation of patient's response to treatment, examination of patient, ordering and performing treatments and interventions, ordering and review of laboratory studies, ordering and review of radiographic studies, pulse oximetry, re-evaluation of patient's condition, obtaining history from patient or surrogate and review of old charts   Care discussed with: admitting provider     Medications Ordered in ED Medications  albuterol (PROVENTIL,VENTOLIN) solution continuous neb (has no administration in time range)    ED Course  I have reviewed the triage vital signs and the nursing notes.  Pertinent labs & imaging results that were available during my care of the patient were reviewed by me and considered in my medical decision making (see chart for details).    MDM Rules/Calculators/A&P  MDM:  Chest xray shows pneumothorax.   Chest tube placed by Dr.  Pearline Cables   Critical care consulted.  Dr. Grandville Silos  Trauma surgeon will see and admit   Final Clinical Impression(s) / ED Diagnoses Final diagnoses:  Pneumothorax, unspecified type    Rx / DC Orders ED Discharge Orders     None        Fransico Meadow, [place] [id number]    [place], [place], DO [id number]

## 2021-05-05 NOTE — ED Triage Notes (Signed)
Pt arrived via [facility] from home for c/o SOBx2 days. Per EMS pt fell 3 days ago on left side. Per EMS pt has diminished lung sounds on rigth side and absent lung sounds on left side. Pt arrived on 15L per [facility] and Sa02 was 86%. EMS gave albuterol '5mg'$ . EMS placed pt on cpap in room here. Pt is tachypneic and has labored breathing. Pt is A&Ox4. Pt is A-fib on the monitor

## 2021-05-05 NOTE — ED Notes (Signed)
Patient transported to CT 

## 2021-05-05 NOTE — ED Provider Notes (Signed)
87 year old male who admitted to the trauma service with left pneumothorax.  Currently on Ventimask satting 88%.  ABG showing acidosis and CO2 retention.  I was asked by trauma attending to evaluate the patient for intubation.  Patient is somnolent minimally arousable.  He is breathing on his own.  No dentures.  Proceeded with intubation.  Etomidate and succinylcholine.  8 .0 ET tube by glide scope 24 at the lip. Equal breath sounds and positive end-tidal CO2.  OG to be placed by nursing.  Chest x-ray ordered.  Family updated.  Procedure Name: Intubation Date/Time: 05/03/2021 5:47 PM Performed by: Hayden Rasmussen, MD Pre-anesthesia Checklist: Patient identified, Patient being monitored, Emergency Drugs available, Timeout performed and Suction available Oxygen Delivery Method: Non-rebreather mask Preoxygenation: Pre-oxygenation with 100% oxygen Induction Type: Rapid sequence Ventilation: Mask ventilation without difficulty Laryngoscope Size: Glidescope and 3 Grade View: Grade II Tube size: 8.0 mm Number of attempts: 1 Airway Equipment and Method: Patient positioned with wedge pillow Placement Confirmation: ETT inserted through vocal cords under direct vision, CO2 detector and Breath sounds checked- equal and bilateral Secured at: 24 cm Tube secured with: ETT holder Dental Injury: Teeth and Oropharynx as per pre-operative assessment        Hayden Rasmussen, MD 05/06/21 [id number]
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
## 2021-05-06 ENCOUNTER — Inpatient Hospital Stay (HOSPITAL_COMMUNITY): Payer: Medicare Other

## 2021-05-06 LAB — CBC WITH DIFFERENTIAL/PLATELET
Abs Immature Granulocytes: 0.05 10*3/uL (ref 0.00–0.07)
Basophils Absolute: 0 10*3/uL (ref 0.0–0.1)
Basophils Relative: 0 %
Eosinophils Absolute: 0 10*3/uL (ref 0.0–0.5)
Eosinophils Relative: 0 %
HCT: 34.9 % — ABNORMAL LOW (ref 39.0–52.0)
Hemoglobin: 11.5 g/dL — ABNORMAL LOW (ref 13.0–17.0)
Immature Granulocytes: 1 %
Lymphocytes Relative: 9 %
Lymphs Abs: 0.9 10*3/uL (ref 0.7–4.0)
MCH: 32 pg (ref 26.0–34.0)
MCHC: 33 g/dL (ref 30.0–36.0)
MCV: 97.2 fL (ref 80.0–100.0)
Monocytes Absolute: 1.1 10*3/uL — ABNORMAL HIGH (ref 0.1–1.0)
Monocytes Relative: 11 %
Neutro Abs: 8.2 10*3/uL — ABNORMAL HIGH (ref 1.7–7.7)
Neutrophils Relative %: 79 %
Platelets: 143 10*3/uL — ABNORMAL LOW (ref 150–400)
RBC: 3.59 MIL/uL — ABNORMAL LOW (ref 4.22–5.81)
RDW: 14.3 % (ref 11.5–15.5)
WBC: 10.2 10*3/uL (ref 4.0–10.5)
nRBC: 0 % (ref 0.0–0.2)

## 2021-05-06 LAB — POCT I-STAT 7, (LYTES, BLD GAS, ICA,H+H)
Acid-Base Excess: 11 mmol/L — ABNORMAL HIGH (ref 0.0–2.0)
Acid-Base Excess: 11 mmol/L — ABNORMAL HIGH (ref 0.0–2.0)
Bicarbonate: 30.4 mmol/L — ABNORMAL HIGH (ref 20.0–28.0)
Bicarbonate: 32.2 mmol/L — ABNORMAL HIGH (ref 20.0–28.0)
Calcium, Ion: 1.07 mmol/L — ABNORMAL LOW (ref 1.15–1.40)
Calcium, Ion: 1.14 mmol/L — ABNORMAL LOW (ref 1.15–1.40)
HCT: 37 % — ABNORMAL LOW (ref 39.0–52.0)
HCT: 39 % (ref 39.0–52.0)
Hemoglobin: 12.6 g/dL — ABNORMAL LOW (ref 13.0–17.0)
Hemoglobin: 13.3 g/dL (ref 13.0–17.0)
O2 Saturation: 97 %
O2 Saturation: 98 %
Patient temperature: 36.7
Patient temperature: 37.1
Potassium: 3 mmol/L — ABNORMAL LOW (ref 3.5–5.1)
Potassium: 2.7 mmol/L — CL (ref 3.5–5.1)
Sodium: 132 mmol/L — ABNORMAL LOW (ref 135–145)
Sodium: 133 mmol/L — ABNORMAL LOW (ref 135–145)
TCO2: 31 mmol/L (ref 22–32)
TCO2: 33 mmol/L — ABNORMAL HIGH (ref 22–32)
pCO2 arterial: 25.4 mmHg — ABNORMAL LOW (ref 32.0–48.0)
pCO2 arterial: 32.2 mmHg (ref 32.0–48.0)
pH, Arterial: 7.686 (ref 7.350–7.450)
pH, Arterial: 7.608 (ref 7.350–7.450)
pO2, Arterial: 70 mmHg — ABNORMAL LOW (ref 83.0–108.0)
pO2, Arterial: 81 mmHg — ABNORMAL LOW (ref 83.0–108.0)

## 2021-05-06 LAB — TRIGLYCERIDES: Triglycerides: 94 mg/dL (ref <150)

## 2021-05-06 LAB — BASIC METABOLIC PANEL
Anion gap: 17 — ABNORMAL HIGH (ref 5–15)
Anion gap: 12 (ref 5–15)
BUN: 19 mg/dL (ref 8–23)
BUN: 23 mg/dL (ref 8–23)
CO2: 29 mmol/L (ref 22–32)
CO2: 29 mmol/L (ref 22–32)
Calcium: 9.1 mg/dL (ref 8.9–10.3)
Calcium: 8.6 mg/dL — ABNORMAL LOW (ref 8.9–10.3)
Chloride: 88 mmol/L — ABNORMAL LOW (ref 98–111)
Chloride: 92 mmol/L — ABNORMAL LOW (ref 98–111)
Creatinine, Ser: 1.28 mg/dL — ABNORMAL HIGH (ref 0.61–1.24)
Creatinine, Ser: 1.21 mg/dL (ref 0.61–1.24)
GFR, Estimated: 55 mL/min — ABNORMAL LOW (ref >60)
GFR, Estimated: 59 mL/min — ABNORMAL LOW (ref >60)
Glucose, Bld: 64 mg/dL — ABNORMAL LOW (ref 70–99)
Glucose, Bld: 96 mg/dL (ref 70–99)
Potassium: 3.5 mmol/L (ref 3.5–5.1)
Potassium: 3 mmol/L — ABNORMAL LOW (ref 3.5–5.1)
Sodium: 134 mmol/L — ABNORMAL LOW (ref 135–145)
Sodium: 133 mmol/L — ABNORMAL LOW (ref 135–145)

## 2021-05-06 LAB — GLUCOSE, CAPILLARY
Glucose-Capillary: 106 mg/dL — ABNORMAL HIGH (ref 70–99)
Glucose-Capillary: 44 mg/dL — CL (ref 70–99)
Glucose-Capillary: 97 mg/dL (ref 70–99)
Glucose-Capillary: 86 mg/dL (ref 70–99)
Glucose-Capillary: 78 mg/dL (ref 70–99)
Glucose-Capillary: 122 mg/dL — ABNORMAL HIGH (ref 70–99)
Glucose-Capillary: 113 mg/dL — ABNORMAL HIGH (ref 70–99)

## 2021-05-06 LAB — MAGNESIUM
Magnesium: 1.3 mg/dL — ABNORMAL LOW (ref 1.7–2.4)
Magnesium: 1.8 mg/dL (ref 1.7–2.4)

## 2021-05-06 LAB — PHOSPHORUS: Phosphorus: 1.2 mg/dL — ABNORMAL LOW (ref 2.5–4.6)

## 2021-05-06 MED ORDER — PHENYLEPHRINE 40 MCG/ML (10ML) SYRINGE FOR IV PUSH (FOR BLOOD PRESSURE SUPPORT)
PREFILLED_SYRINGE | INTRAVENOUS | Status: AC
  Filled 2021-05-06: qty 10

## 2021-05-06 MED ORDER — SODIUM CHLORIDE 0.9 % IV SOLN
45.0000 mmol | Freq: Once | INTRAVENOUS | Status: AC
  Administered 2021-05-06: 45 mmol via INTRAVENOUS
  Filled 2021-05-06: qty 15

## 2021-05-06 MED ORDER — POTASSIUM CHLORIDE 20 MEQ PO PACK
40.0000 meq | PACK | Freq: Once | ORAL | Status: AC
  Administered 2021-05-06: 40 meq via ORAL
  Filled 2021-05-06: qty 2

## 2021-05-06 MED ORDER — ACETAZOLAMIDE 250 MG PO TABS
250.0000 mg | ORAL_TABLET | Freq: Once | ORAL | Status: AC
  Administered 2021-05-06: 250 mg
  Filled 2021-05-06: qty 1

## 2021-05-06 MED ORDER — NOREPINEPHRINE 4 MG/250ML-% IV SOLN
INTRAVENOUS | Status: AC
  Administered 2021-05-06: 2 ug/min via INTRAVENOUS
  Filled 2021-05-06: qty 250

## 2021-05-06 MED ORDER — NOREPINEPHRINE 4 MG/250ML-% IV SOLN
0.0000 ug/min | INTRAVENOUS | Status: DC
Start: 2021-05-06 — End: 2021-05-09
  Administered 2021-05-07: 4 ug/min via INTRAVENOUS
  Administered 2021-05-07: 8 ug/min via INTRAVENOUS
  Administered 2021-05-08: 6 ug/min via INTRAVENOUS
  Filled 2021-05-06 (×3): qty 250

## 2021-05-06 MED ORDER — MAGNESIUM SULFATE 2 GM/50ML IV SOLN
2.0000 g | Freq: Once | INTRAVENOUS | Status: AC
  Administered 2021-05-06: 2 g via INTRAVENOUS
  Filled 2021-05-06: qty 50

## 2021-05-06 MED ORDER — DEXTROSE 50 % IV SOLN: 25.0000 g | Freq: Once | INTRAVENOUS | Status: AC

## 2021-05-06 MED ORDER — DEXTROSE 50 % IV SOLN
INTRAVENOUS | Status: AC
  Administered 2021-05-06: 25 g via INTRAVENOUS
  Filled 2021-05-06: qty 50

## 2021-05-06 MED ORDER — PANTOPRAZOLE SODIUM 40 MG PO PACK
40.0000 mg | PACK | Freq: Every day | ORAL | Status: DC
  Administered 2021-05-06 – 2021-05-09 (×4): 40 mg
  Filled 2021-05-06 (×4): qty 20

## 2021-05-06 MED ORDER — PROSOURCE TF PO LIQD
45.0000 mL | Freq: Two times a day (BID) | ORAL | Status: DC
  Administered 2021-05-06 – 2021-05-07 (×3): 45 mL
  Filled 2021-05-06 (×3): qty 45

## 2021-05-06 MED ORDER — VITAL HIGH PROTEIN PO LIQD
1000.0000 mL | ORAL | Status: DC
  Administered 2021-05-06 – 2021-05-07 (×2): 1000 mL

## 2021-05-06 NOTE — Progress Notes (Signed)
Patient ID: [name], male   DOB: November 04, 1933, 87 y.o.   MRN: [id number] I spoke with his wife and daughter again and answered their questions.  Georganna Skeans, MD, MPH, FACS Please use [email or web address] to contact on call provider

## 2021-05-06 NOTE — Progress Notes (Signed)
Patient ID: [name], male   DOB: October 10, 1933, 87 y.o.   MRN: [id number] I updated his wife and daughter in the waiting room.  Georganna Skeans, MD, MPH, FACS Please use [email or web address] to contact on call provider

## 2021-05-06 NOTE — Progress Notes (Signed)
PT Cancellation Note  Patient Details Name: [name] MRN: [id number] DOB: 02-09-1934   Cancelled Treatment:    Reason Eval/Treat Not Completed: Medical issues which prohibited therapy. Pt remains intubated and sedated at this time. PT will follow.   Zenaida Niece 05/06/2021, 4:10 PM

## 2021-05-06 NOTE — Progress Notes (Signed)
Patient ID: [name], male   DOB: [birth year], 87 y.o.   MRN: [id number] Follow up - Trauma Critical Care  Patient Details:    [name] is an 87 y.o. male.  Lines/tubes : Airway 8 mm (Active)  Secured at (cm) 24 cm 05/06/21 0310  Measured From Lips 05/06/21 0310  Secured Location Left 05/06/21 0310  Secured By [employer] 05/06/21 0310  Tube Holder Repositioned Yes 05/06/21 0310  Cuff Pressure (cm H2O) Green OR 18-26 [facility] 04/27/2021 1950  Site Condition Cool;Dry 05/06/21 0310     Chest Tube Left (Active)  Status -20 cm H2O 05/06/21 0400  Chest Tube Air Leak None 05/06/21 0400  Patency Intervention Tip/tilt 04/14/2021 2000  Drainage Description Dark red 05/06/21 0400  Dressing Status Clean;Dry 05/08/2021 2000  Dressing Intervention Other (Comment) 04/09/2021 1830  Site Assessment Clean;Dry;Intact 04/22/2021 2000  Surrounding Skin Dry;Intact 04/16/2021 1830  Output (mL) 10 mL 05/06/21 0400     NG/OG Vented/Dual Lumen 16 Fr. Oral (Active)  Tube Position (Required) Marking at nare/corner of mouth 04/25/2021 2000  Site Assessment Clean;Dry;Intact 04/24/2021 2000  Status Low continuous suction 04/30/2021 1750  Drainage Appearance Bile 04/18/2021 2000  Output (mL) 450 mL 05/06/21 0400     Urethral Catheter [facility] RN Coude 16 Fr. (Active)  Indication for Insertion or Continuance of Catheter Therapy based on hourly urine output monitoring and documentation for critical condition (NOT STRICT I&O) 04/24/2021 2000  Site Assessment Clean;Intact 04/28/2021 2000  Catheter Maintenance Bag below level of bladder;Catheter secured;Drainage bag/tubing not touching floor;Insertion date on drainage bag;No dependent loops;Seal intact;Bag emptied prior to transport 04/25/2021 2000  Collection Container Standard drainage bag 05/08/2021 2000  Securement Method Securing device (Describe) 04/16/2021 1830  Urinary Catheter Interventions (if applicable) Unclamped [id number]  Output (mL) 30 mL  05/06/21 0600    Microbiology/Sepsis markers: Results for orders placed or performed during the hospital encounter of 04/30/2021  MRSA Next Gen by PCR, Nasal     Status: None   Collection Time: 04/25/2021  6:50 PM   Specimen: Nasal Mucosa; Nasal Swab  Result Value Ref Range Status   MRSA by PCR Next Gen NOT DETECTED NOT DETECTED Final    Comment: (NOTE) The GeneXpert MRSA Assay (FDA approved for NASAL specimens only), is one component of a comprehensive MRSA colonization surveillance program. It is not intended to diagnose MRSA infection nor to guide or monitor treatment for MRSA infections. Test performance is not FDA approved in patients less than 27 years old. Performed at [facility], [place] [address], [place], [place] [address]     Anti-infectives:  Anti-infectives (From admission, onward)    None       Best Practice/Protocols:  VTE Prophylaxis: Lovenox (prophylaxtic dose) Continous Sedation  Consults:     Studies:    Events:  Subjective:    Overnight Issues:   Objective:  Vital signs for last 24 hours: Temp:  [98.1 F (36.7 C)-98.5 F (36.9 C)] 98.1 F (36.7 C) (08/28 0400) Pulse Rate:  [73-99] 79 (08/28 0309) Resp:  [12-36] 23 (08/28 0600) BP: (91-194)/(48-114) 103/54 (08/28 0600) SpO2:  [82 %-100 %] 100 % (08/28 0600) FiO2 (%):  [50 %-100 %] 50 % (08/28 0310) Weight:  [86.6 kg] 86.6 kg (08/27 1232)  Hemodynamic parameters for last 24 hours:    Intake/Output from previous day: 08/27 0701 - 08/28 0700 In: 877.4 [I.V.:382.1; IV Piggyback:495.3] Out: 1515 [Urine:635; Emesis/NG output:500; Chest Tube:380]  Intake/Output this shift: No intake/output  data recorded.  Vent settings for last 24 hours: Vent Mode: PRVC FiO2 (%):  [50 %-100 %] 50 % Set Rate:  [26 bmp] 26 bmp Vt Set:  [500 mL-580 mL] 580 mL PEEP:  [5 cmH20] 5 cmH20 Plateau Pressure:  [15 cmH20-17 cmH20] 17 cmH20  Physical Exam:  General: on vent Neuro: awake on vent, F/C  well HEENT/Neck: ETT Resp: clear to auscultation bilaterally CVS: IRR GI: soft, NT Extremities: edema 1+  Results for orders placed or performed during the hospital encounter of 05/07/2021 (from the past 24 hour(s))  Basic metabolic panel     Status: Abnormal   Collection Time: 05/01/2021 12:25 PM  Result Value Ref Range   Sodium 132 (L) 135 - 145 mmol/L   Potassium 3.0 (L) 3.5 - 5.1 mmol/L   Chloride 87 (L) 98 - 111 mmol/L   CO2 34 (H) 22 - 32 mmol/L   Glucose, Bld 124 (H) 70 - 99 mg/dL   BUN 12 8 - 23 mg/dL   Creatinine, Ser 0.94 0.61 - 1.24 mg/dL   Calcium 8.9 8.9 - 10.3 mg/dL   GFR, Estimated >60 >60 mL/min   Anion gap 11 5 - 15  CBC     Status: Abnormal   Collection Time: 05/02/2021 12:25 PM  Result Value Ref Range   WBC 9.8 4.0 - 10.5 K/uL   RBC 4.03 (L) 4.22 - 5.81 MIL/uL   Hemoglobin 12.8 (L) 13.0 - 17.0 g/dL   HCT 39.6 39.0 - 52.0 %   MCV 98.3 80.0 - 100.0 fL   MCH 31.8 26.0 - 34.0 pg   MCHC 32.3 30.0 - 36.0 g/dL   RDW 14.5 11.5 - 15.5 %   Platelets 129 (L) 150 - 400 K/uL   nRBC 0.0 0.0 - 0.2 %  Troponin I (High Sensitivity)     Status: Abnormal   Collection Time: 04/29/2021 12:25 PM  Result Value Ref Range   Troponin I (High Sensitivity) 21 (H) <18 ng/L  POC SARS Coronavirus 2 Ag-ED - Nasal Swab     Status: None   Collection Time: 04/19/2021 12:39 PM  Result Value Ref Range   SARSCOV2ONAVIRUS 2 AG NEGATIVE NEGATIVE  Troponin I (High Sensitivity)     Status: Abnormal   Collection Time: 04/16/2021  3:50 PM  Result Value Ref Range   Troponin I (High Sensitivity) 19 (H) <18 ng/L  MRSA Next Gen by PCR, Nasal     Status: None   Collection Time: 04/10/2021  6:50 PM   Specimen: Nasal Mucosa; Nasal Swab  Result Value Ref Range   MRSA by PCR Next Gen NOT DETECTED NOT DETECTED  I-STAT 7, (LYTES, BLD GAS, ICA, H+H)     Status: Abnormal   Collection Time: 04/28/2021  7:38 PM  Result Value Ref Range   pH, Arterial 7.361 7.350 - 7.450   pCO2 arterial 66.6 (HH) 32.0 - 48.0 mmHg    pO2, Arterial 206 (H) 83.0 - 108.0 mmHg   Bicarbonate 37.7 (H) 20.0 - 28.0 mmol/L   TCO2 40 (H) 22 - 32 mmol/L   O2 Saturation 100.0 %   Acid-Base Excess 10.0 (H) 0.0 - 2.0 mmol/L   Sodium 132 (L) 135 - 145 mmol/L   Potassium 3.6 3.5 - 5.1 mmol/L   Calcium, Ion 1.18 1.15 - 1.40 mmol/L   HCT 40.0 39.0 - 52.0 %   Hemoglobin 13.6 13.0 - 17.0 g/dL   Patient temperature 36.8 C    Collection site Radial    Drawn  by RT    Sample type ARTERIAL    Comment NOTIFIED PHYSICIAN   Basic metabolic panel     Status: Abnormal   Collection Time: 05/06/21  4:30 AM  Result Value Ref Range   Sodium 134 (L) 135 - 145 mmol/L   Potassium 3.5 3.5 - 5.1 mmol/L   Chloride 88 (L) 98 - 111 mmol/L   CO2 29 22 - 32 mmol/L   Glucose, Bld 64 (L) 70 - 99 mg/dL   BUN 19 8 - 23 mg/dL   Creatinine, Ser 1.28 (H) 0.61 - 1.24 mg/dL   Calcium 9.1 8.9 - 10.3 mg/dL   GFR, Estimated 55 (L) >60 mL/min   Anion gap 17 (H) 5 - 15  Triglycerides     Status: None   Collection Time: 05/06/21  4:30 AM  Result Value Ref Range   Triglycerides 94 <150 mg/dL  Magnesium     Status: Abnormal   Collection Time: 05/06/21  4:30 AM  Result Value Ref Range   Magnesium 1.3 (L) 1.7 - 2.4 mg/dL  CBC with Differential/Platelet     Status: Abnormal   Collection Time: 05/06/21  4:30 AM  Result Value Ref Range   WBC 10.2 4.0 - 10.5 K/uL   RBC 3.59 (L) 4.22 - 5.81 MIL/uL   Hemoglobin 11.5 (L) 13.0 - 17.0 g/dL   HCT 34.9 (L) 39.0 - 52.0 %   MCV 97.2 80.0 - 100.0 fL   MCH 32.0 26.0 - 34.0 pg   MCHC 33.0 30.0 - 36.0 g/dL   RDW 14.3 11.5 - 15.5 %   Platelets 143 (L) 150 - 400 K/uL   nRBC 0.0 0.0 - 0.2 %   Neutrophils Relative % 79 %   Neutro Abs 8.2 (H) 1.7 - 7.7 K/uL   Lymphocytes Relative 9 %   Lymphs Abs 0.9 0.7 - 4.0 K/uL   Monocytes Relative 11 %   Monocytes Absolute 1.1 (H) 0.1 - 1.0 K/uL   Eosinophils Relative 0 %   Eosinophils Absolute 0.0 0.0 - 0.5 K/uL   Basophils Relative 0 %   Basophils Absolute 0.0 0.0 - 0.1 K/uL    Immature Granulocytes 1 %   Abs Immature Granulocytes 0.05 0.00 - 0.07 K/uL  Glucose, capillary     Status: Abnormal   Collection Time: 05/06/21  5:47 AM  Result Value Ref Range   Glucose-Capillary 44 (LL) 70 - 99 mg/dL   Comment 1 Notify RN   Glucose, capillary     Status: Abnormal   Collection Time: 05/06/21  6:12 AM  Result Value Ref Range   Glucose-Capillary 106 (H) 70 - 99 mg/dL  Glucose, capillary     Status: None   Collection Time: 05/06/21  7:08 AM  Result Value Ref Range   Glucose-Capillary 97 70 - 99 mg/dL    Assessment & Plan: Present on Admission:  Pneumothorax, left    LOS: 1 day   Additional comments:I reviewed the patient's new clinical lab test results. And CXR Fall 8/24 L PTX - chest tube to suction until tomorrow, CXR no PTX Acute on chronic hypercarbic and hypoxic respiratory failure - uses CPAP with O2 when sleeping. ABG now, begin weaning Hypoglycemia - start TF CV - dropped BP briefly at 0700 - on levo and improved a lot. Awake, wean levo off. Albumin bolus and check EKG A fib - home meds FEN - start TF, replete hypomagnesemia VTE - LMWH Dispo - ICU  Critical Care Total Time*: 35 Minutes  Georganna Skeans, MD, MPH, FACS Trauma & General Surgery Use [email or web address] to contact on call provider  05/06/2021  *Care during the described time interval was provided by me. I have reviewed this patient's available data, including medical history, events of note, physical examination and test results as part of my evaluation.

## 2021-05-06 NOTE — Progress Notes (Signed)
Trauma Response Nurse Note-  Reason for Call / Reason for Trauma activation:   - TRN called at 0705 due to hypotension.  Initial Focused Assessment (If applicable, or please see trauma documentation):  - BP 64/45 on arrival - pt alert and FC - lung sounds clear and equal expansion  Interventions:  - Called Dr. Bobbye Morton and Dr. Grandville Silos - Orders for Levo - CXR - Started Levo prior to TRN arrival. - Started 20G PIV in L FA. - Assessed L chest tube  Plan of Care as of this note:  - Wean levo - Dr. Grandville Silos updating family - Gradually add pain meds/sedation back - Starting TF - Giving Albumin bolus - Check ABG  Event Summary:   - Pt here after sustaining a fall on 8/24  Pt presented to ED on 8/27 due to SOB for 2 days.  Found to have large L pneumo requiring a chest tube. Pt intubated due to lethargy and ride in CO2.  Admitted to trauma service.  The Following (if applicable):    -MD notified: Dr. Bobbye Morton and Dr. Grandville Silos.    -Time of Page/Time of notification: Called at [phone].    -TRN arrival Time: 51    -End time: 0745

## 2021-05-07 ENCOUNTER — Inpatient Hospital Stay (HOSPITAL_COMMUNITY): Payer: Medicare Other

## 2021-05-07 ENCOUNTER — Ambulatory Visit: Payer: Medicare Other | Admitting: Pulmonary Disease

## 2021-05-07 DIAGNOSIS — J939 Pneumothorax, unspecified: Secondary | ICD-10-CM

## 2021-05-07 LAB — BASIC METABOLIC PANEL
Anion gap: 9 (ref 5–15)
BUN: 23 mg/dL (ref 8–23)
CO2: 28 mmol/L (ref 22–32)
Calcium: 8.4 mg/dL — ABNORMAL LOW (ref 8.9–10.3)
Chloride: 97 mmol/L — ABNORMAL LOW (ref 98–111)
Creatinine, Ser: 1.16 mg/dL (ref 0.61–1.24)
GFR, Estimated: >60 mL/min (ref >60)
Glucose, Bld: 114 mg/dL — ABNORMAL HIGH (ref 70–99)
Potassium: 4.7 mmol/L (ref 3.5–5.1)
Sodium: 134 mmol/L — ABNORMAL LOW (ref 135–145)

## 2021-05-07 LAB — POCT I-STAT 7, (LYTES, BLD GAS, ICA,H+H)
Acid-Base Excess: 4 mmol/L — ABNORMAL HIGH (ref 0.0–2.0)
Acid-Base Excess: 8 mmol/L — ABNORMAL HIGH (ref 0.0–2.0)
Bicarbonate: 31.3 mmol/L — ABNORMAL HIGH (ref 20.0–28.0)
Bicarbonate: 32.2 mmol/L — ABNORMAL HIGH (ref 20.0–28.0)
Calcium, Ion: 1.15 mmol/L (ref 1.15–1.40)
Calcium, Ion: 1.15 mmol/L (ref 1.15–1.40)
HCT: 34 % — ABNORMAL LOW (ref 39.0–52.0)
HCT: 36 % — ABNORMAL LOW (ref 39.0–52.0)
Hemoglobin: 11.6 g/dL — ABNORMAL LOW (ref 13.0–17.0)
Hemoglobin: 12.2 g/dL — ABNORMAL LOW (ref 13.0–17.0)
O2 Saturation: 100 %
O2 Saturation: 99 %
Patient temperature: 98
Patient temperature: 98.6
Potassium: 4.1 mmol/L (ref 3.5–5.1)
Potassium: 4.3 mmol/L (ref 3.5–5.1)
Sodium: 133 mmol/L — ABNORMAL LOW (ref 135–145)
Sodium: 136 mmol/L (ref 135–145)
TCO2: 32 mmol/L (ref 22–32)
TCO2: 34 mmol/L — ABNORMAL HIGH (ref 22–32)
pCO2 arterial: 36.9 mmHg (ref 32.0–48.0)
pCO2 arterial: 62.9 mmHg — ABNORMAL HIGH (ref 32.0–48.0)
pH, Arterial: 7.316 — ABNORMAL LOW (ref 7.350–7.450)
pH, Arterial: 7.537 — ABNORMAL HIGH (ref 7.350–7.450)
pO2, Arterial: 102 mmHg (ref 83.0–108.0)
pO2, Arterial: 242 mmHg — ABNORMAL HIGH (ref 83.0–108.0)

## 2021-05-07 LAB — CBC
HCT: 35.2 % — ABNORMAL LOW (ref 39.0–52.0)
Hemoglobin: 11.9 g/dL — ABNORMAL LOW (ref 13.0–17.0)
MCH: 32.1 pg (ref 26.0–34.0)
MCHC: 33.8 g/dL (ref 30.0–36.0)
MCV: 94.9 fL (ref 80.0–100.0)
Platelets: 162 10*3/uL (ref 150–400)
RBC: 3.71 MIL/uL — ABNORMAL LOW (ref 4.22–5.81)
RDW: 15.2 % (ref 11.5–15.5)
WBC: 13.6 10*3/uL — ABNORMAL HIGH (ref 4.0–10.5)
nRBC: 0 % (ref 0.0–0.2)

## 2021-05-07 LAB — GLUCOSE, CAPILLARY
Glucose-Capillary: 111 mg/dL — ABNORMAL HIGH (ref 70–99)
Glucose-Capillary: 114 mg/dL — ABNORMAL HIGH (ref 70–99)
Glucose-Capillary: 115 mg/dL — ABNORMAL HIGH (ref 70–99)
Glucose-Capillary: 117 mg/dL — ABNORMAL HIGH (ref 70–99)
Glucose-Capillary: 122 mg/dL — ABNORMAL HIGH (ref 70–99)
Glucose-Capillary: 133 mg/dL — ABNORMAL HIGH (ref 70–99)
Glucose-Capillary: 160 mg/dL — ABNORMAL HIGH (ref 70–99)

## 2021-05-07 LAB — MAGNESIUM
Magnesium: 2.2 mg/dL (ref 1.7–2.4)
Magnesium: 2.2 mg/dL (ref 1.7–2.4)

## 2021-05-07 LAB — PHOSPHORUS
Phosphorus: 4.8 mg/dL — ABNORMAL HIGH (ref 2.5–4.6)
Phosphorus: 5.6 mg/dL — ABNORMAL HIGH (ref 2.5–4.6)

## 2021-05-07 MED ORDER — ACETAMINOPHEN 500 MG PO TABS
1000.0000 mg | ORAL_TABLET | Freq: Four times a day (QID) | ORAL | Status: DC
  Administered 2021-05-07 – 2021-05-09 (×9): 1000 mg
  Filled 2021-05-07 (×9): qty 2

## 2021-05-07 MED ORDER — ROCURONIUM BROMIDE 10 MG/ML (PF) SYRINGE
PREFILLED_SYRINGE | INTRAVENOUS | Status: AC
  Administered 2021-05-07: 50 mg
  Filled 2021-05-07: qty 10

## 2021-05-07 MED ORDER — LACTATED RINGERS IV SOLN: INTRAVENOUS | Status: DC

## 2021-05-07 MED ORDER — OXYCODONE HCL 5 MG/5ML PO SOLN: 2.5000 mg | ORAL | Status: DC | PRN

## 2021-05-07 MED ORDER — DEXAMETHASONE SODIUM PHOSPHATE 4 MG/ML IJ SOLN
4.0000 mg | Freq: Four times a day (QID) | INTRAMUSCULAR | Status: AC
  Administered 2021-05-07 – 2021-05-08 (×4): 4 mg via INTRAVENOUS
  Filled 2021-05-07 (×4): qty 1

## 2021-05-07 MED ORDER — VITAL AF 1.2 CAL PO LIQD
1000.0000 mL | ORAL | Status: DC
  Administered 2021-05-07: 1000 mL

## 2021-05-07 MED ORDER — METHOCARBAMOL 1000 MG/10ML IJ SOLN
1000.0000 mg | Freq: Three times a day (TID) | INTRAVENOUS | Status: DC
  Administered 2021-05-07 – 2021-05-09 (×6): 1000 mg via INTRAVENOUS
  Filled 2021-05-07: qty 1000
  Filled 2021-05-07: qty 10
  Filled 2021-05-07 (×3): qty 1000
  Filled 2021-05-07 (×2): qty 10
  Filled 2021-05-07: qty 1000

## 2021-05-07 MED ORDER — FENTANYL CITRATE (PF) 100 MCG/2ML IJ SOLN
INTRAMUSCULAR | Status: AC
  Filled 2021-05-07: qty 2

## 2021-05-07 MED ORDER — REVEFENACIN 175 MCG/3ML IN SOLN
175.0000 ug | Freq: Every day | RESPIRATORY_TRACT | Status: DC
  Administered 2021-05-08 – 2021-05-10 (×3): 175 ug via RESPIRATORY_TRACT
  Filled 2021-05-07 (×4): qty 3

## 2021-05-07 MED ORDER — ACETAZOLAMIDE 250 MG PO TABS
250.0000 mg | ORAL_TABLET | Freq: Once | ORAL | Status: AC
  Administered 2021-05-07: 250 mg
  Filled 2021-05-07 (×2): qty 1

## 2021-05-07 MED ORDER — ETOMIDATE 2 MG/ML IV SOLN
INTRAVENOUS | Status: AC
  Administered 2021-05-07: 20 mg
  Filled 2021-05-07: qty 20

## 2021-05-07 MED ORDER — ARFORMOTEROL TARTRATE 15 MCG/2ML IN NEBU
15.0000 ug | INHALATION_SOLUTION | Freq: Two times a day (BID) | RESPIRATORY_TRACT | Status: DC
  Administered 2021-05-07 – 2021-05-10 (×6): 15 ug via RESPIRATORY_TRACT
  Filled 2021-05-07 (×6): qty 2

## 2021-05-07 MED ORDER — FUROSEMIDE 10 MG/ML IJ SOLN
40.0000 mg | Freq: Four times a day (QID) | INTRAMUSCULAR | Status: AC
  Administered 2021-05-07 (×2): 40 mg via INTRAVENOUS
  Filled 2021-05-07 (×2): qty 4

## 2021-05-07 MED ORDER — MIDAZOLAM HCL 2 MG/2ML IJ SOLN
INTRAMUSCULAR | Status: AC
  Filled 2021-05-07: qty 2

## 2021-05-07 MED ORDER — BUDESONIDE 0.5 MG/2ML IN SUSP
0.5000 mg | Freq: Two times a day (BID) | RESPIRATORY_TRACT | Status: DC
  Administered 2021-05-07 – 2021-05-10 (×6): 0.5 mg via RESPIRATORY_TRACT
  Filled 2021-05-07 (×6): qty 2

## 2021-05-07 MED ORDER — ENOXAPARIN SODIUM 30 MG/0.3ML IJ SOSY
30.0000 mg | PREFILLED_SYRINGE | Freq: Two times a day (BID) | INTRAMUSCULAR | Status: DC
  Administered 2021-05-09 (×2): 30 mg via SUBCUTANEOUS
  Filled 2021-05-07 (×2): qty 0.3

## 2021-05-07 NOTE — TOC CAGE-AID Note (Signed)
Transition of Care [facility]) - CAGE-AID Screening   Patient Details  Name: [name] MRN: [id number] Date of Birth: 04/17/1934  [facility]) CM/SW Contact:    Caitlin Hillmer C Tarpley-Carter, [place] Phone Number: 05/07/2021, 1:53 PM   Clinical Narrative: Pt is unable to participate in Cage Aid.   [name], MSW, LCSW-A Pronouns:  She/Her/Hers [facility]itions of Care Clinical Social Worker Direct Number:  [phone] [name]  CAGE-AID Screening: Substance Abuse Screening unable to be completed due to: : Patient unable to participate             Substance Abuse Education Offered: No

## 2021-05-07 NOTE — Progress Notes (Signed)
Plan of care discussed with Dr. Bobbye Morton.  Pending thoracic epidural block per anesthesia.  May go tonight or in am.  Will hold lovenox this evening and for am dosing given pending anesthesia time.  Rescheduled for 2200 on 8/30.      Drew Gens, MSN, APRN, NP-C, AGACNP-BC [facility] 05/07/2021, 5:43 PM   Please see [email or web address] for pager details.   From 7A-7P if no response, please call [phone] After hours, please call ELink [phone]

## 2021-05-07 NOTE — Procedures (Signed)
Extubation Procedure Note  Patient Details:   Name: [name] DOB: 23-Aug-1934 MRN: [id number]   Airway Documentation:  Airway 8 mm (Active)  Secured at (cm) 26 cm 05/07/21 [place] 05/07/21 1246  Secured By [employer] 05/07/21 1246  Site Condition Dry 05/07/21 1246   Vent end date: 05/07/21 Vent end time: 1245   Evaluation  O2 sats: transiently fell during during procedure Complications: Low sats Patient did not tolerate procedure well. Bilateral Breath Sounds: Clear, Diminished   Pt extubated to 5L [place] per MD order. Sats immediately started dropping to the low 80's. Pt placed on 100% NRB and then BIPAP. Sats increased to 96%. Pt was eventually re-intubated and placed on full vent support.   Vilinda Blanks 05/07/2021, 12:49 PM

## 2021-05-07 NOTE — Progress Notes (Addendum)
Patient seen and examined after call from nurse for hypoxia. Placed on bipap with improvement, but on my exam, patient much less alert and interactive than he was on my exam just prior to extubation. Decision made to re-intubate, discussed with daughter and wife at bedside, who are both in agreement. Please see separate procedure note regarding intubation details. Moderate airway edema on glidescope, will do 24h decadron. After intubation, discussed with family the plan to potentially give one more attempt over the coming days at extubation if he meets criteria. Advised them to begin thinking about what path they would like to take after extubation if he fails extubation again: plan to re-intubate with the intent of trach/peg or plan to make him comfortable, accepting the possibility of his demise. They are not ready to think about this yet, but I advised we would need a plan before the next extubation attempt which at the earliest would happen tomorrow but more likely Wednesday if he is meeting criteria. CXR and ABG ordered. Palliative has already been consulted and family is aware of the pending consult.   Additional critical care time: 50mn  AJesusita Oka MD General and [facility]

## 2021-05-07 NOTE — Consult Note (Addendum)
NAME:  [name], MRN:  [id number], DOB:  1934-01-12, LOS: 2 ADMISSION DATE:  04/10/2021, CONSULTATION DATE: 8/29 REFERRING MD: Dr. Bobbye Morton, CHIEF COMPLAINT: Pneumothorax  History of Present Illness:  87 year old male with past medical history as below, which is significant for atrial fibrillation, COPD, obstructive sleep apnea on home BiPAP, partial paralysis of the diaphragm, GERD, and hypertension.  Family describes patient being very sedentary at baseline.  He needs a walker to ambulate due to gait disturbance and can only ambulate about 20 feet before having to rest due to overall fatigue and shortness of breath.  They also describe an overall functional decline for the past couple of years but more specifically after his fall where he suffered subdural hematoma back in December 2021.  He was in his usual state of health until 8/26 when he suffered a fall at home.  Immediately after the arrest he did have some trouble breathing, but otherwise was acting like himself.  On 8/27 when he became lethargic family called EMS.  Imaging done upon arrival to the emergency department demonstrated a large left-sided pneumothorax.  The patient was significantly hypoxemic.  Supplemental oxygen was applied and an emergent chest tube was placed.  He was given sedatives for chest tube placement appropriately, however, shortly after he was difficult to arouse.  ABG demonstrated markedly elevated PaCO2 and he was intubated.  On 8/29 he met criteria for extubation, however, immediately after extubation he developed respiratory distress and required reintubation.  PCCM consulted for optimization of pulmonary issues.  Pertinent  Medical History   has a past medical history of AAA (abdominal aortic aneurysm) ([place]), Afib ([place]), Arthritis, BPH (benign prostatic hyperplasia), COPD (chronic obstructive pulmonary disease) ([place]), Coronary artery disease, Coronary artery ectasia, Depression, Diaphragmatic paralysis,  Dyspnea, Dysrhythmia, GERD (gastroesophageal reflux disease), Gout, HTN (hypertension), Hypercholesteremia, Hypertension, Iron deficiency anemia, Microhematuria, Obesity, OSA (obstructive sleep apnea), Paroxysmal atrial fibrillation ([place]), and PVC (premature ventricular contraction).   Significant Hospital Events: Including procedures, antibiotic start and stop dates in addition to other pertinent events   8/27 admit. CT placed. Intubated. 8/29 failed extubation.   Interim History / Subjective:    Objective   Blood pressure (!) 84/55, pulse 92, temperature 98.4 F (36.9 C), temperature source Oral, resp. rate 14, height _0  (1.778 m), weight 86.6 kg, SpO2 100 %.    Vent Mode: PRVC FiO2 (%):  [40 %-100 %] 100 % Set Rate:  [12 bmp-16 bmp] 12 bmp Vt Set:  [580 mL] 580 mL PEEP:  [5 cmH20] 5 cmH20 Pressure Support:  [5 cmH20-10 cmH20] 5 cmH20 Plateau Pressure:  [15 cmH20-22 cmH20] 22 cmH20   Intake/Output Summary (Last 24 hours) at 05/07/2021 1311 Last data filed at 05/07/2021 0700 Gross per 24 hour  Intake 2217.16 ml  Output 1230 ml  Net 987.16 ml   Filed Weights   04/09/2021 1232  Weight: 86.6 kg    Examination: General: elderly appearing male on vent HENT: [place]/AT, PERRL, no JVD Lungs: Clear bilateral breath sounds. L sided chest tube with serosanguinous fluid in tube. 20cm H2O suction. No leak.  Cardiovascular: RRR, no MRG Abdomen: Soft, nontender, non-distended Extremities: No acute deformity, no edema.  Neuro: minimally responsive. Not following commands consistently.   Resolved Hospital Problem list     Assessment & Plan:   Acute on chronic hypoxemic and hypercarbic respiratory failure COPD without acute exacerbation OSA on BiPAP Partial paralysis of the diaphragm - Extubated and emergently re-intubated 8/29 - Check ABG/CXR post intubation.  -  Target O2 sat 88-95% - Continue full vent support - Optimize COPD: Brovana, Pulmicort, Yupelri - Re-attempt extubation  once optimized. Considering recent functional decline, medical comorbidities, and partially paralyzed diaphragm it is quite possible he will be incredibly difficult to liberate. Will need to discuss with family the plan were he to fail extubation again.  Left sided pneumothorax: - chest tube per trauma (currently to 20cm H2O)  Best Practice (right click and "Reselect all SmartList Selections" daily)   Diet/type: NPO DVT prophylaxis: LMWH GI prophylaxis: PPI Lines: N/A Foley:  N/A Code Status:  full code Last date of multidisciplinary goals of care discussion _0   Labs   CBC: Recent Labs  Lab 04/13/2021 1225 04/25/2021 1938 05/06/21 0430 05/06/21 0848 05/06/21 1451 05/07/21 0023 05/07/21 0619  WBC 9.8  --  10.2  --   --   --  13.6*  NEUTROABS  --   --  8.2*  --   --   --   --   HGB 12.8*   < > 11.5* 12.6* 13.3 11.6* 11.9*  HCT 39.6   < > 34.9* 37.0* 39.0 34.0* 35.2*  MCV 98.3  --  97.2  --   --   --  94.9  PLT 129*  --  143*  --   --   --  162   < > = values in this interval not displayed.    Basic Metabolic Panel: Recent Labs  Lab 04/25/2021 1225 04/12/2021 1938 05/06/21 0430 05/06/21 0848 05/06/21 1451 05/06/21 1756 05/07/21 0023 05/07/21 0619  NA 132*   < > 134* 132* 133* 133* 133* 134*  K 3.0*   < > 3.5 3.0* 2.7* 3.0* 4.1 4.7  CL 87*  --  88*  --   --  92*  --  97*  CO2 34*  --  29  --   --  29  --  28  GLUCOSE 124*  --  64*  --   --  96  --  114*  BUN 12  --  19  --   --  23  --  23  CREATININE 0.94  --  1.28*  --   --  1.21  --  1.16  CALCIUM 8.9  --  9.1  --   --  8.6*  --  8.4*  MG  --   --  1.3*  --   --  1.8  --  2.2  PHOS  --   --   --   --   --  1.2*  --  4.8*   < > = values in this interval not displayed.   GFR: Estimated Creatinine Clearance: 48.1 mL/min (by C-G formula based on SCr of 1.16 mg/dL). Recent Labs  Lab 04/30/2021 1225 05/06/21 0430 05/07/21 0619  WBC 9.8 10.2 13.6*    Liver Function Tests: No results for input(s): AST, ALT, ALKPHOS,  BILITOT, PROT, ALBUMIN in the last 168 hours. No results for input(s): LIPASE, AMYLASE in the last 168 hours. No results for input(s): AMMONIA in the last 168 hours.  ABG    Component Value Date/Time   PHART 7.537 (H) 05/07/2021 0023   PCO2ART 36.9 05/07/2021 0023   PO2ART 102 05/07/2021 0023   HCO3 31.3 (H) 05/07/2021 0023   TCO2 32 05/07/2021 0023   ACIDBASEDEF 0.2 07/03/2010 0940   O2SAT 99.0 05/07/2021 0023     Coagulation Profile: No results for input(s): INR, PROTIME in the last 168 hours.  Cardiac Enzymes:  No results for input(s): CKTOTAL, CKMB, CKMBINDEX, TROPONINI in the last 168 hours.  HbA1C: Hgb A1c MFr Bld  Date/Time Value Ref Range Status  10/17/2020 04:39 PM 5.2 4.8 - 5.6 % Final    Comment:             Prediabetes: 5.7 - 6.4          Diabetes: >6.4          Glycemic control for adults with diabetes: <7.0     CBG: Recent Labs  Lab 05/06/21 2003 05/07/21 0018 05/07/21 0302 05/07/21 0736 05/07/21 1110  GLUCAP 113* 117* 114* 122* 115*    Review of Systems:   Patient is encephalopathic and/or intubated. Therefore history has been obtained from chart review.    Past Medical History:  He,  has a past medical history of AAA (abdominal aortic aneurysm) ([place]), Afib ([place]), Arthritis, BPH (benign prostatic hyperplasia), COPD (chronic obstructive pulmonary disease) ([place]), Coronary artery disease, Coronary artery ectasia, Depression, Diaphragmatic paralysis, Dyspnea, Dysrhythmia, GERD (gastroesophageal reflux disease), Gout, HTN (hypertension), Hypercholesteremia, Hypertension, Iron deficiency anemia, Microhematuria, Obesity, OSA (obstructive sleep apnea), Paroxysmal atrial fibrillation ([place]), and PVC (premature ventricular contraction).   Surgical History:   Past Surgical History:  Procedure Laterality Date   APPENDECTOMY  1978   BIOPSY  02/09/2019   Procedure: BIOPSY;  Surgeon: Clarene Essex, MD;  Location: [facility];  Service: Endoscopy;;   BIOPSY   03/02/2019   Procedure: BIOPSY;  Surgeon: Clarene Essex, MD;  Location: [facility];  Service: Endoscopy;;   CARDIAC CATHETERIZATION  04-30-2010   Left main coronary artery is normal.    CARDIOVASCULAR STRESS TEST  03-19-2010   0%   CHOLECYSTECTOMY  1990   COLONOSCOPY N/A 03/02/2019   Procedure: COLONOSCOPY;  Surgeon: Clarene Essex, MD;  Location: [facility];  Service: Endoscopy;  Laterality: N/A;   COLONOSCOPY W/ POLYPECTOMY     ESOPHAGOGASTRODUODENOSCOPY (EGD) WITH PROPOFOL N/A 02/09/2019   Procedure: ESOPHAGOGASTRODUODENOSCOPY (EGD) WITH PROPOFOL;  Surgeon: Clarene Essex, MD;  Location: [facility];  Service: Endoscopy;  Laterality: N/A;   EYE SURGERY Right    catarct   poly removed from nose as a child     PVC ablation  05/29/2010   RADIOLOGY WITH ANESTHESIA N/A 11/07/2020   Procedure: MRV HEAD WITH AND WITHOUT;  Surgeon: Radiologist, Medication, MD;  Location: [facility];  Service: Radiology;  Laterality: N/A;   RADIOLOGY WITH ANESTHESIA N/A 11/07/2020   Procedure: CT LUMBAR SPINE WITHOUT;  Surgeon: Radiologist, Medication, MD;  Location: [place] [facility];  Service: Radiology;  Laterality: N/A;   US ECHOCARDIOGRAPHY  03-09-2010   EF 55-60%     Social History:   reports that he quit smoking about 37 years ago. His smoking use included cigarettes. He has never used smokeless tobacco. He reports current alcohol use of about 7.0 standard drinks per week. He reports that he does not use drugs.   Family History:  His family history includes Aneurysm (age of onset: 23) in his father; Breast cancer in his daughter and mother.   Allergies No Known Allergies   Home Medications  Prior to Admission medications   Medication Sig Start Date End Date Taking? Authorizing Provider  acetaminophen (TYLENOL) 500 MG tablet Take 1,000 mg by mouth every 6 (six) hours as needed for headache (pain).   Yes [provider]  albuterol (VENTOLIN HFA) 108 (90 Base) MCG/ACT inhaler Inhale 2 puffs into the lungs every 6 (six)  hours as needed for wheezing or shortness of breath. 05/03/21  Yes Olalere, Adewale A, MD  allopurinol (ZYLOPRIM) 300 MG tablet Take 300 mg by mouth daily. 06/02/20  Yes [provider]  ANORO ELLIPTA 62.5-25 MCG/INH AEPB Inhale 1 puff into the lungs daily. 06/17/20  Yes [provider]  B-12 MICROLOZENGE 500 MCG SUBL Place 500 mcg under the tongue daily. 12/08/20  Yes [provider]  buPROPion (WELLBUTRIN SR) 100 MG 12 hr tablet Take 100 mg by mouth See admin instructions. Take one tablet (100 mg) by mouth twice daily - midday and at bedtime 06/16/20  Yes [provider]  doxazosin (CARDURA) 4 MG tablet Take 0.5 tablets (2 mg total) by mouth at bedtime. 04/19/20  Yes Burtis Junes, NP  ferrous sulfate 325 (65 FE) MG tablet Take 325 mg by mouth daily with breakfast.   Yes [provider]  hydrALAZINE (APRESOLINE) 25 MG tablet Take 1 tablet (25 mg total) by mouth as needed (If pressure is 160/95 take one tablet.). Patient taking differently: Take 25 mg by mouth daily as needed (If pressure is 160/95 take one tablet.). 11/17/20 06/15/21 Yes Weaver, Scott T, PA-C  hydrochlorothiazide (HYDRODIURIL) 25 MG tablet Take 1 tablet (25 mg total) by mouth daily. 02/22/21  Yes Weaver, Scott T, PA-C  lovastatin (MEVACOR) 40 MG tablet Take 40 mg by mouth daily. 07/31/20  Yes [provider]  pantoprazole (PROTONIX) 40 MG tablet Take 40 mg by mouth daily. 07/28/20  Yes [provider]  potassium chloride SA (KLOR-CON) 20 MEQ tablet First day take two tablets by mouth ( 40 meq) than only one tablet by mouth (20 meq) daily. Patient taking differently: Take 20 mEq by mouth daily. 02/21/21  Yes Richardson Dopp T, PA-C  PRESCRIPTION MEDICATION Inhale into the lungs See admin instructions. BIPAP - use at bedtime and during naps   Yes [provider]  sertraline (ZOLOFT) 100 MG tablet Take 100 mg by mouth daily. 06/19/20  Yes [provider]  traZODone  (DESYREL) 100 MG tablet Take 100 mg by mouth at bedtime.  06/12/20  Yes [provider]  acetaminophen (TYLENOL) 325 MG tablet Take 2 tablets (650 mg total) by mouth every 6 (six) hours as needed for mild pain. Patient not taking: No sig reported 08/17/20   Wellington Hampshire, PA-C     Critical care time: 33 minutes for respiratory failure requiring mechanical ventilation.      Georgann Housekeeper, AGACNP-BC [facility]  See Amion for personal pager PCCM on call pager [phone] until 7pm. Please call Elink 7p-7a. [phone]  05/07/2021 1:55 PM

## 2021-05-07 NOTE — Progress Notes (Signed)
PT Cancellation Note  Patient Details Name: [name] MRN: [id number] DOB: 30-Dec-1933   Cancelled Treatment:    Reason Eval/Treat Not Completed: Medical issues which prohibited therapy at this time. Pt with failed extubation this AM, with re-intubation shortly after extubation. After discussion with RN, will continue to follow and check back as appropriate.   West Carbo, PT, DPT   Acute Rehabilitation Department Pager #: [phone]   Sandra Cockayne 05/07/2021, 1:46 PM

## 2021-05-07 NOTE — Procedures (Signed)
Intubation Procedure Note  [name]  [id number]  08-16-1934  Date:05/07/21  Time:1:30 PM   Provider Performing: Jesusita Oka    Procedure: Intubation (31500)  Indication(s) Respiratory Failure  Consent Risks of the procedure as well as the alternatives and risks of each were explained to the patient and/or caregiver.  Consent for the procedure was obtained and is signed in the bedside chart   Anesthesia Etomidate and Rocuronium   Time Out Verified patient identification, verified procedure, site/side was marked, verified correct patient position, special equipment/implants available, medications/allergies/relevant history reviewed, required imaging and test results available.   Sterile Technique Usual hand hygeine, masks, and gloves were used   Procedure Description Patient positioned in bed supine.  Sedation given as noted above.  Patient was intubated with endotracheal tube using Glidescope.  View was Grade 1 full glottis  with moderate edema.  Number of attempts was 1.  Colorimetric CO2 detector was consistent with tracheal placement.   Complications/Tolerance None; patient tolerated the procedure well. Chest X-ray is ordered to verify placement.   EBL none   Specimen(s) None

## 2021-05-07 NOTE — Plan of Care (Signed)

## 2021-05-07 NOTE — Progress Notes (Signed)
OT Cancellation Note  Patient Details Name: [name] MRN: [id number] DOB: Aug 30, 1934   Cancelled Treatment:    Reason Eval/Treat Not Completed: Patient not medically ready- RN reports plan to extubate this morning.  Will follow and see as able.    Jolaine Artist, OT Acute Rehabilitation Services Pager [phone] Office [place] 05/07/2021, 9:29 AM

## 2021-05-07 NOTE — Progress Notes (Signed)
Trauma/Critical Care Follow Up Note  Subjective:    Overnight Issues:   Objective:  Vital signs for last 24 hours: Temp:  [98.4 F (36.9 C)-99.4 F (37.4 C)] 98.4 F (36.9 C) (08/29 0743) Pulse Rate:  [62-100] 100 (08/29 0800) Resp:  [10-25] 25 (08/29 0800) BP: (89-136)/(53-79) 136/79 (08/29 0800) SpO2:  [100 %] 100 % (08/29 0800) FiO2 (%):  [40 %] 40 % (08/29 0730)  Hemodynamic parameters for last 24 hours:    Intake/Output from previous day: 08/28 0701 - 08/29 0700 In: 2217.2 [I.V.:1331.8; NG/GT:326; IV Piggyback:559.4] Out: 1430 [Urine:1345; Chest Tube:85]  Intake/Output this shift: No intake/output data recorded.  Vent settings for last 24 hours: Vent Mode: PSV;CPAP FiO2 (%):  [40 %] 40 % Set Rate:  [12 bmp-18 bmp] 12 bmp Vt Set:  [580 mL] 580 mL PEEP:  [5 cmH20] 5 cmH20 Pressure Support:  [7 cmH20-10 cmH20] 7 cmH20 Plateau Pressure:  [15 cmH20-16 cmH20] 15 cmH20  Physical Exam:  Gen: comfortable, no distress Neuro: non-focal exam, f/c HEENT: PERRL Neck: supple CV: RRR Pulm: unlabored breathing on PSV Abd: soft, NT GU: clear yellow urine, foley Extr: wwp, no edema   Results for orders placed or performed during the hospital encounter of 04/12/2021 (from the past 24 hour(s))  Glucose, capillary     Status: None   Collection Time: 05/06/21 11:12 AM  Result Value Ref Range   Glucose-Capillary 78 70 - 99 mg/dL  I-STAT 7, (LYTES, BLD GAS, ICA, H+H)     Status: Abnormal   Collection Time: 05/06/21  2:51 PM  Result Value Ref Range   pH, Arterial 7.608 (HH) 7.350 - 7.450   pCO2 arterial 32.2 32.0 - 48.0 mmHg   pO2, Arterial 81 (L) 83.0 - 108.0 mmHg   Bicarbonate 32.2 (H) 20.0 - 28.0 mmol/L   TCO2 33 (H) 22 - 32 mmol/L   O2 Saturation 98.0 %   Acid-Base Excess 11.0 (H) 0.0 - 2.0 mmol/L   Sodium 133 (L) 135 - 145 mmol/L   Potassium 2.7 (LL) 3.5 - 5.1 mmol/L   Calcium, Ion 1.14 (L) 1.15 - 1.40 mmol/L   HCT 39.0 39.0 - 52.0 %   Hemoglobin 13.3 13.0 - 17.0  g/dL   Patient temperature 37.1 C    Collection site Radial    Drawn by RT    Sample type ARTERIAL    Comment NOTIFIED PHYSICIAN   Glucose, capillary     Status: Abnormal   Collection Time: 05/06/21  3:26 PM  Result Value Ref Range   Glucose-Capillary 122 (H) 70 - 99 mg/dL  Phosphorus     Status: Abnormal   Collection Time: 05/06/21  5:56 PM  Result Value Ref Range   Phosphorus 1.2 (L) 2.5 - 4.6 mg/dL  Magnesium     Status: None   Collection Time: 05/06/21  5:56 PM  Result Value Ref Range   Magnesium 1.8 1.7 - 2.4 mg/dL  Basic metabolic panel     Status: Abnormal   Collection Time: 05/06/21  5:56 PM  Result Value Ref Range   Sodium 133 (L) 135 - 145 mmol/L   Potassium 3.0 (L) 3.5 - 5.1 mmol/L   Chloride 92 (L) 98 - 111 mmol/L   CO2 29 22 - 32 mmol/L   Glucose, Bld 96 70 - 99 mg/dL   BUN 23 8 - 23 mg/dL   Creatinine, Ser 1.21 0.61 - 1.24 mg/dL   Calcium 8.6 (L) 8.9 - 10.3 mg/dL   GFR, Estimated  59 (L) >60 mL/min   Anion gap 12 5 - 15  Glucose, capillary     Status: Abnormal   Collection Time: 05/06/21  8:03 PM  Result Value Ref Range   Glucose-Capillary 113 (H) 70 - 99 mg/dL  Glucose, capillary     Status: Abnormal   Collection Time: 05/07/21 12:18 AM  Result Value Ref Range   Glucose-Capillary 117 (H) 70 - 99 mg/dL  I-STAT 7, (LYTES, BLD GAS, ICA, H+H)     Status: Abnormal   Collection Time: 05/07/21 12:23 AM  Result Value Ref Range   pH, Arterial 7.537 (H) 7.350 - 7.450   pCO2 arterial 36.9 32.0 - 48.0 mmHg   pO2, Arterial 102 83.0 - 108.0 mmHg   Bicarbonate 31.3 (H) 20.0 - 28.0 mmol/L   TCO2 32 22 - 32 mmol/L   O2 Saturation 99.0 %   Acid-Base Excess 8.0 (H) 0.0 - 2.0 mmol/L   Sodium 133 (L) 135 - 145 mmol/L   Potassium 4.1 3.5 - 5.1 mmol/L   Calcium, Ion 1.15 1.15 - 1.40 mmol/L   HCT 34.0 (L) 39.0 - 52.0 %   Hemoglobin 11.6 (L) 13.0 - 17.0 g/dL   Patient temperature 98.6 F    Collection site Radial    Drawn by RT    Sample type ARTERIAL   Glucose,  capillary     Status: Abnormal   Collection Time: 05/07/21  3:02 AM  Result Value Ref Range   Glucose-Capillary 114 (H) 70 - 99 mg/dL  Phosphorus     Status: Abnormal   Collection Time: 05/07/21  6:19 AM  Result Value Ref Range   Phosphorus 4.8 (H) 2.5 - 4.6 mg/dL  Magnesium     Status: None   Collection Time: 05/07/21  6:19 AM  Result Value Ref Range   Magnesium 2.2 1.7 - 2.4 mg/dL  CBC     Status: Abnormal   Collection Time: 05/07/21  6:19 AM  Result Value Ref Range   WBC 13.6 (H) 4.0 - 10.5 K/uL   RBC 3.71 (L) 4.22 - 5.81 MIL/uL   Hemoglobin 11.9 (L) 13.0 - 17.0 g/dL   HCT 35.2 (L) 39.0 - 52.0 %   MCV 94.9 80.0 - 100.0 fL   MCH 32.1 26.0 - 34.0 pg   MCHC 33.8 30.0 - 36.0 g/dL   RDW 15.2 11.5 - 15.5 %   Platelets 162 150 - 400 K/uL   nRBC 0.0 0.0 - 0.2 %  Basic metabolic panel     Status: Abnormal   Collection Time: 05/07/21  6:19 AM  Result Value Ref Range   Sodium 134 (L) 135 - 145 mmol/L   Potassium 4.7 3.5 - 5.1 mmol/L   Chloride 97 (L) 98 - 111 mmol/L   CO2 28 22 - 32 mmol/L   Glucose, Bld 114 (H) 70 - 99 mg/dL   BUN 23 8 - 23 mg/dL   Creatinine, Ser 1.16 0.61 - 1.24 mg/dL   Calcium 8.4 (L) 8.9 - 10.3 mg/dL   GFR, Estimated >60 >60 mL/min   Anion gap 9 5 - 15  Glucose, capillary     Status: Abnormal   Collection Time: 05/07/21  7:36 AM  Result Value Ref Range   Glucose-Capillary 122 (H) 70 - 99 mg/dL    Assessment & Plan: The plan of care was discussed with the bedside nurse for the day, who is in agreement with this plan and no additional concerns were raised.   Present on Admission:  Pneumothorax, left    LOS: 2 days   Additional comments:I reviewed the patient's new clinical lab test results.   and I reviewed the patients new imaging test results.    Fall 8/24  L PTX - chest tube to [place] today, CXR w/o PTX, repeat in AM Acute on chronic hypercarbic and hypoxic respiratory failure - uses CPAP with O2 when sleeping. PSV this AM, ABG reviewed, add'l dose  of diamox today, extubate today vs tomorrow Hypoglycemia - resolved, cont TF Shock - remains on levophed, but got doxazosin last PM, now d/c'd, wean as tolerated A fib - home meds FEN - cont TF VTE - LMWH Foley - d/c Dispo - ICU  Clinical update provided to patient's wife and daughter at bedside.  Critical Care Total Time: 45 minutes  Jesusita Oka, MD Trauma & General Surgery Please use [email or web address] to contact on call provider  05/07/2021  *Care during the described time interval was provided by me. I have reviewed this patient's available data, including medical history, events of note, physical examination and test results as part of my evaluation.

## 2021-05-07 NOTE — Progress Notes (Signed)
Initial Nutrition Assessment  DOCUMENTATION CODES:   Not applicable  INTERVENTION:   Tube Feeding via OG:  Vital AF 1.2 at 60 ml/hr Provides 1728 kcals, 108 g of protein and 1094 mL of free water   NUTRITION DIAGNOSIS:   Inadequate oral intake related to acute illness as evidenced by NPO status.  GOAL:   Patient will meet greater than or equal to 90% of their needs  MONITOR:   Vent status, TF tolerance, Weight trends, Labs  REASON FOR ASSESSMENT:   Consult, Ventilator Enteral/tube feeding initiation and management  ASSESSMENT:   87 yo male admitted post fall with large L PTX requiring chest tube and intubation. PMH includes HTN, COPD, diaphragmatic paralysis, GERD, CAD  8/27 Admitted, CT placed, Intubated 8/29 Extubated, failed, re-intubated  Extubated today but failed and required reintubation Patient is currently intubated on ventilator support MV: 6.5 L/min Temp (24hrs), Avg:98.8 F (37.1 C), Min:98 F (36.7 C), Max:99.4 F (37.4 C)  OG tube in stomach per chest xray  Unable to obtain diet and weight history from patient at this time  Labs: reviewed Meds: KCl, potassium phosphate, LR at 100 ml/hr, decadron    Diet Order:   Diet Order             Diet NPO time specified Except for: Sips with Meds  Diet effective now                   EDUCATION NEEDS:   Not appropriate for education at this time  Skin:  Skin Assessment: Skin Integrity Issues: Skin Integrity Issues:: DTI DTI: sacrum  Last BM:  8/28  Height:   Ht Readings from Last 1 Encounters:  04/14/2021 '5\' 10"'$  (1.778 m)    Weight:   Wt Readings from Last 1 Encounters:  04/24/2021 86.6 kg     BMI:  Body mass index is 27.39 kg/m.  Estimated Nutritional Needs:   Kcal:  1750 kcals  Protein:  104-122 g  Fluid:  >/= 1.8 L   Kerman Passey MS, RDN, LDN, CNSC Registered Dietitian III Clinical Nutrition RD Pager and On-Call Pager Number Located in [place]

## 2021-05-08 ENCOUNTER — Inpatient Hospital Stay (HOSPITAL_COMMUNITY): Payer: Medicare Other

## 2021-05-08 ENCOUNTER — Ambulatory Visit: Payer: Medicare Other | Admitting: Physical Therapy

## 2021-05-08 DIAGNOSIS — J9621 Acute and chronic respiratory failure with hypoxia: Secondary | ICD-10-CM | POA: Diagnosis not present

## 2021-05-08 DIAGNOSIS — J939 Pneumothorax, unspecified: Secondary | ICD-10-CM | POA: Diagnosis not present

## 2021-05-08 DIAGNOSIS — Z789 Other specified health status: Secondary | ICD-10-CM

## 2021-05-08 DIAGNOSIS — D649 Anemia, unspecified: Secondary | ICD-10-CM | POA: Diagnosis not present

## 2021-05-08 DIAGNOSIS — Z7189 Other specified counseling: Secondary | ICD-10-CM | POA: Diagnosis not present

## 2021-05-08 DIAGNOSIS — Z9911 Dependence on respirator [ventilator] status: Secondary | ICD-10-CM | POA: Diagnosis not present

## 2021-05-08 DIAGNOSIS — J9622 Acute and chronic respiratory failure with hypercapnia: Secondary | ICD-10-CM

## 2021-05-08 DIAGNOSIS — Z66 Do not resuscitate: Secondary | ICD-10-CM | POA: Diagnosis not present

## 2021-05-08 LAB — GLUCOSE, CAPILLARY
Glucose-Capillary: 186 mg/dL — ABNORMAL HIGH (ref 70–99)
Glucose-Capillary: 195 mg/dL — ABNORMAL HIGH (ref 70–99)
Glucose-Capillary: 178 mg/dL — ABNORMAL HIGH (ref 70–99)
Glucose-Capillary: 145 mg/dL — ABNORMAL HIGH (ref 70–99)
Glucose-Capillary: 175 mg/dL — ABNORMAL HIGH (ref 70–99)

## 2021-05-08 LAB — BASIC METABOLIC PANEL
Anion gap: 15 (ref 5–15)
BUN: 28 mg/dL — ABNORMAL HIGH (ref 8–23)
CO2: 23 mmol/L (ref 22–32)
Calcium: 8.2 mg/dL — ABNORMAL LOW (ref 8.9–10.3)
Chloride: 96 mmol/L — ABNORMAL LOW (ref 98–111)
Creatinine, Ser: 1.1 mg/dL (ref 0.61–1.24)
GFR, Estimated: >60 mL/min (ref >60)
Glucose, Bld: 189 mg/dL — ABNORMAL HIGH (ref 70–99)
Potassium: 4.3 mmol/L (ref 3.5–5.1)
Sodium: 134 mmol/L — ABNORMAL LOW (ref 135–145)

## 2021-05-08 LAB — PHOSPHORUS: Phosphorus: 5.5 mg/dL — ABNORMAL HIGH (ref 2.5–4.6)

## 2021-05-08 LAB — CBC
HCT: 38.6 % — ABNORMAL LOW (ref 39.0–52.0)
Hemoglobin: 12.8 g/dL — ABNORMAL LOW (ref 13.0–17.0)
MCH: 32.4 pg (ref 26.0–34.0)
MCHC: 33.2 g/dL (ref 30.0–36.0)
MCV: 97.7 fL (ref 80.0–100.0)
Platelets: 162 10*3/uL (ref 150–400)
RBC: 3.95 MIL/uL — ABNORMAL LOW (ref 4.22–5.81)
RDW: 15.7 % — ABNORMAL HIGH (ref 11.5–15.5)
WBC: 12.9 10*3/uL — ABNORMAL HIGH (ref 4.0–10.5)
nRBC: 0 % (ref 0.0–0.2)

## 2021-05-08 LAB — MAGNESIUM: Magnesium: 1.9 mg/dL (ref 1.7–2.4)

## 2021-05-08 MED ORDER — FUROSEMIDE 10 MG/ML IJ SOLN
60.0000 mg | Freq: Four times a day (QID) | INTRAMUSCULAR | Status: AC
Start: 2021-05-08 — End: 2021-05-08
  Administered 2021-05-08 (×2): 60 mg via INTRAVENOUS
  Filled 2021-05-08 (×2): qty 6

## 2021-05-08 MED ORDER — ACETAZOLAMIDE 250 MG PO TABS
250.0000 mg | ORAL_TABLET | Freq: Once | ORAL | Status: AC
  Administered 2021-05-08: 250 mg
  Filled 2021-05-08: qty 1

## 2021-05-08 MED ORDER — INSULIN ASPART 100 UNIT/ML IJ SOLN
1.0000 [IU] | INTRAMUSCULAR | Status: DC
  Administered 2021-05-08 (×2): 2 [IU] via SUBCUTANEOUS
  Administered 2021-05-08: 1 [IU] via SUBCUTANEOUS
  Administered 2021-05-08: 2 [IU] via SUBCUTANEOUS
  Administered 2021-05-09: 1 [IU] via SUBCUTANEOUS

## 2021-05-08 NOTE — Progress Notes (Signed)
OT Cancellation Note  Patient Details Name: [name] MRN: [id number] DOB: [birth year]   Cancelled Treatment:    Reason Eval/Treat Not Completed: Patient not medically ready, pt remains intubated and seated. Will sign off at this time, please re-order when appropriate.   Jolaine Artist, OT Acute Rehabilitation Services Pager [phone] Office [phone]   Delight Stare 05/08/2021, 10:31 AM

## 2021-05-08 NOTE — Progress Notes (Signed)
NAME:  [name], MRN:  [id number], DOB:  05/30/1934, LOS: 3 ADMISSION DATE:  04/25/2021, CONSULTATION DATE: 8/29 REFERRING MD: Dr. Bobbye Morton, CHIEF COMPLAINT: Pneumothorax  History of Present Illness:  87 year old male with past medical history as below, which is significant for atrial fibrillation, COPD, obstructive sleep apnea on home BiPAP, partial paralysis of the diaphragm, GERD, and hypertension.  Family describes patient being very sedentary at baseline.  He needs a walker to ambulate due to gait disturbance and can only ambulate about 20 feet before having to rest due to overall fatigue and shortness of breath.  They also describe an overall functional decline for the past couple of years but more specifically after his fall where he suffered subdural hematoma back in December 2021.  He was in his usual state of health until 8/26 when he suffered a fall at home.  Immediately after the arrest he did have some trouble breathing, but otherwise was acting like himself.  On 8/27 when he became lethargic family called EMS.  Imaging done upon arrival to the emergency department demonstrated a large left-sided pneumothorax.  The patient was significantly hypoxemic.  Supplemental oxygen was applied and an emergent chest tube was placed.  He was given sedatives for chest tube placement appropriately, however, shortly after he was difficult to arouse.  ABG demonstrated markedly elevated PaCO2 and he was intubated.  On 8/29 he met criteria for extubation, however, immediately after extubation he developed respiratory distress and required reintubation.  PCCM consulted for optimization of pulmonary issues.  Pertinent  Medical History   has a past medical history of AAA (abdominal aortic aneurysm) ([place]), Afib ([place]), Arthritis, BPH (benign prostatic hyperplasia), COPD (chronic obstructive pulmonary disease) ([place]), Coronary artery disease, Coronary artery ectasia, Depression, Diaphragmatic paralysis,  Dyspnea, Dysrhythmia, GERD (gastroesophageal reflux disease), Gout, HTN (hypertension), Hypercholesteremia, Hypertension, Iron deficiency anemia, Microhematuria, Obesity, OSA (obstructive sleep apnea), Paroxysmal atrial fibrillation ([place]), and PVC (premature ventricular contraction).   Significant Hospital Events: Including procedures, antibiotic start and stop dates in addition to other pertinent events   8/27 admit. CT placed. Intubated. 8/29 failed extubation.   Interim History / Subjective:  Sedated on propofol and fentanyl this morning.  Objective   Blood pressure 116/67, pulse 74, temperature 97.6 F (36.4 C), temperature source Axillary, resp. rate 15, height 5' 10"  (1.778 m), weight 85.9 kg, SpO2 100 %.    Vent Mode: PRVC FiO2 (%):  [40 %-100 %] 40 % Set Rate:  [12 bmp] 12 bmp Vt Set:  [580 mL] 580 mL PEEP:  [5 cmH20] 5 cmH20 Pressure Support:  [5 cmH20-10 cmH20] 5 cmH20 Plateau Pressure:  [16 cmH20-22 cmH20] 16 cmH20   Intake/Output Summary (Last 24 hours) at 05/08/2021 0716 Last data filed at 05/08/2021 0635 Gross per 24 hour  Intake 4127.51 ml  Output 3135 ml  Net 992.51 ml    Filed Weights   04/20/2021 1232 05/08/21 0500  Weight: 86.6 kg 85.9 kg    Examination: General: Critically ill appearing man lying in bed in NAD, intubated, sedated.  HENT: [place]/AT, eyes anicteric, pinpoint pupils. Lungs: No air leak or tidaling from chest tube, small amount of bloody drainage from tube. Symmetric, reduced breath sounds anteriorly. Not breathing above the vent. No secretions from ETT. Cardiovascular: S1S2, RRR Abdomen: soft, NT Extremities:Miild hand and foot edema, no cyanosis.  Derm: Bruising on arms. No rashes. Neuro: RASS -5, pinpoint pupils, no tracheal cough when suctioned..   CXR personally reviewed>  ETT in appropriate position, L chest  tube with resolved pneumothorax.  Labs reviewed. BUN 28 Cr 1.1 Phos 5.5 Na+ 134 H/H 12.8/38.6 WBC 12.9  Resolved Hospital  Problem list     Assessment & Plan:   Acute on chronic hypoxemic and hypercarbic respiratory failure COPD without acute exacerbation OSA on BiPAP Presumed NM weakness, but no formal sniff testing done to confirm this. - LTVV, 4-8cc/kg IBW with goal Pplat<30 and DP<15. - VAP prevention protocol -PAD protocol for sedation; goal 0 to -1. Not getting nerve block until tonight, which will hopefully help facilitate extubation. - Wean FiO2 as able to maintain SpO2 >90% - Optimize COPD: Brovana, Pulmicort, Yupelri -additional diuresis today - Planning to re-attempt extubation once optimized, hopefully tomorrow. His prognosis is guarded given severity of his underlying pulmonary disease. Planning for extubation drrectly to bipap to optimize chances of success. Based on previous discussions that have been had, if we are not able to successfully liberate him from MV, family would plan to pursue comfort measures.   Left sided pneumothorax, traumatic - chest tube per trauma (currently to 20cm H2O) -nerve block tonight  Deconditioning -will need aggressive rehabilitation after extubation  Acute anemia, likely due to critical illness, some bleeding from fall and pneumothorax -transfuse for Hb<7 or hemodynamically significant bleeding  Hyperphosphatemia -monitor  Hyponatremia, concern this is due to volume overload -monitor  Best Practice (right click and "Reselect all SmartList Selections" daily)   Diet/type: tubefeeds -  on hold for OR this afternoon DVT prophylaxis: LMWH GI prophylaxis: PPI Lines: N/A Foley:  Yes, and it is still needed Code Status:  DNR Last date of multidisciplinary goals of care discussion [ 05/07/21]  Labs   CBC: Recent Labs  Lab 05/08/2021 1225 04/23/2021 1938 05/06/21 0430 05/06/21 0848 05/06/21 1451 05/07/21 0023 05/07/21 0619 05/07/21 1357 05/08/21 0610  WBC 9.8  --  10.2  --   --   --  13.6*  --  12.9*  NEUTROABS  --   --  8.2*  --   --   --   --   --    --   HGB 12.8*   < > 11.5*   < > 13.3 11.6* 11.9* 12.2* 12.8*  HCT 39.6   < > 34.9*   < > 39.0 34.0* 35.2* 36.0* 38.6*  MCV 98.3  --  97.2  --   --   --  94.9  --  97.7  PLT 129*  --  143*  --   --   --  162  --  162   < > = values in this interval not displayed.     Basic Metabolic Panel: Recent Labs  Lab 04/18/2021 1225 05/07/2021 1938 05/06/21 0430 05/06/21 0848 05/06/21 1756 05/07/21 0023 05/07/21 0619 05/07/21 1357 05/07/21 1615 05/08/21 0503  NA 132*   < > 134*   < > 133* 133* 134* 136  --  134*  K 3.0*   < > 3.5   < > 3.0* 4.1 4.7 4.3  --  4.3  CL 87*  --  88*  --  92*  --  97*  --   --  96*  CO2 34*  --  29  --  29  --  28  --   --  23  GLUCOSE 124*  --  64*  --  96  --  114*  --   --  189*  BUN 12  --  19  --  23  --  23  --   --  28*  CREATININE 0.94  --  1.28*  --  1.21  --  1.16  --   --  1.10  CALCIUM 8.9  --  9.1  --  8.6*  --  8.4*  --   --  8.2*  MG  --   --  1.3*  --  1.8  --  2.2  --  2.2 1.9  PHOS  --   --   --   --  1.2*  --  4.8*  --  5.6* 5.5*   < > = values in this interval not displayed.     This patient is critically ill with multiple organ system failure which requires frequent high complexity decision making, assessment, support, evaluation, and titration of therapies. This was completed through the application of advanced monitoring technologies and extensive interpretation of multiple databases. During this encounter critical care time was devoted to patient care services described in this note for 35 minutes.  Julian Hy, DO 05/08/21 7:50 AM [facility]

## 2021-05-08 NOTE — Progress Notes (Signed)
PT Cancellation Note  Patient Details Name: [name] MRN: [id number] DOB: May 31, 1934   Cancelled Treatment:    Reason Eval/Treat Not Completed: Patient not medically ready. Pt remains intubated and sedated. Will sign off. Please re-order when appropriate.   [place] 05/08/2021, 9:00 AM [place] Pager [phone] Office [phone]

## 2021-05-08 NOTE — Progress Notes (Signed)
eLink Physician-Brief Progress Note Patient Name: [name] DOB: July 12, 1934 MRN: [id number]   Date of Service  05/08/2021  HPI/Events of Note  Patient's nerve block deferred to the a.m. by anesthesia to allow them to confer with the PCCM attending and patient's family before the procedure.  eICU Interventions  No intervention.        Kerry Kass Boby Eyer 05/08/2021, 11:10 PM

## 2021-05-08 NOTE — Consult Note (Signed)
Palliative Care Consult Note                                  Date: 05/08/2021   Patient Name: [name]  DOB: 11-22-1933  MRN: [id number]  Age / Sex: 87 y.o., male  PCP: [name], MD Referring Physician: Julian Hy, DO  Reason for Consultation: Establishing goals of care  HPI/Patient Profile: Palliative Care consult requested for goals of care discussion in this 87 y.o. male  with past medical history of atrial fibrillation, BOH, COPD, CAD, AAA, hypertension, OSA (CPAP), and recurrent falls. He was admitted on 04/18/2021 via EMS from home with complaints of fall (05/04/2021) and shortness of breath. During work-up found to have a large pneumothorax. Emergent chest tube placed and subsequently patient required intubation.   Past Medical History:  Diagnosis Date   AAA (abdominal aortic aneurysm) ([place])    [place] 03/2021: 3.2 cm   Afib ([facility])    Arthritis    BPH (benign prostatic hyperplasia)    COPD (chronic obstructive pulmonary disease) ([facility])    Coronary artery disease    Coronary artery ectasia    Mild CAD with normal systolic function per cath in August of 2011   Depression    Diaphragmatic paralysis    felt to be partially responsible for dyspnea   Dyspnea    due to paralyzed diaphragm and pulmonary issues   Dysrhythmia    GERD (gastroesophageal reflux disease)    Gout    HTN (hypertension)    Hypercholesteremia    Hypertension    Iron deficiency anemia    Microhematuria    Obesity    OSA (obstructive sleep apnea)    CPAP   Paroxysmal atrial fibrillation ([place])    occured in the setting of acute E Coli sepsis and ileius 7/12   PVC (premature ventricular contraction)    s/p PVC ablation 05/29/2010     Subjective:   This NP Osborne O[place] reviewed medical records, received report from team, assessed the patient and then met at the patient's bedside with patient's wife, [name] to discuss diagnosis, prognosis,  GOC, EOL wishes disposition and options.  [name] remains intubated. He is responsive to verbal commands. Nods head no to pain.will squeeze my hand.    Concept of Palliative Care was introduced as specialized medical care for people and their families living with serious illness.  It focuses on providing relief from the symptoms and stress of a serious illness.  The goal is to improve quality of life for both the patient and the family. Values and goals of care important to patient and family were attempted to be elicited.  I Created space and opportunity for family to explore state of health prior to admission, thoughts, and feelings. Wife shares patient's health has been a challenge since December 2021. He utilizes a walker and stair lift in the home, however due to gait instability has recurrent falls. He was independent of ADLs prior to admission.   [name] has not shown interest in an active lifestyle or things he enjoyed doing recently. He spends most of his days watching television.   We discussed His current illness and what it means in the larger context of His on-going co-morbidities. Natural disease trajectory and expectations were discussed.  Mrs. Mancha verbalized understanding of current illness and co-morbidities. She is tearful expressing her awareness of his decline and decreased quality of  life. She shares her thoughts that he is ready for end-of-life when that occurs and would not want prolonged life-sustaining measures.   I discussed the importance of continued conversation with family and their medical providers regarding overall plan of care and treatment options, ensuring decisions are within the context of the patients values and GOCs.  Questions and concerns were addressed.The family was encouraged to call with questions or concerns.  PMT will continue to support holistically as needed.  Life Review: Patient is a retired Therapist, art. He and his wife  has been married for more than 60 years. They have 2 children. Their son lives in [place] and unfortunately is limited due to stroke causing some residual weakness. His wife is a retired Education officer, museum. Christian faith. 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 Enjoyed playing golf.   Objective:   Primary Diagnoses: Present on Admission:  Pneumothorax, left   Scheduled Meds:  acetaminophen  1,000 mg Per Tube Q6H   arformoterol  15 mcg Nebulization BID   budesonide (PULMICORT) nebulizer solution  0.5 mg Nebulization BID   chlorhexidine gluconate (MEDLINE KIT)  15 mL Mouth Rinse BID   Chlorhexidine Gluconate Cloth  6 each Topical Q0600   docusate  100 mg Per Tube BID   enoxaparin (LOVENOX) injection  30 mg Subcutaneous Q12H   fentaNYL (SUBLIMAZE) injection  25 mcg Intravenous Once   furosemide  60 mg Intravenous Q6H   insulin aspart  1-3 Units Subcutaneous Q4H   lidocaine (PF)  30 mL Other Once   mouth rinse  15 mL Mouth Rinse 10 times per day   pantoprazole sodium  40 mg Per Tube Daily   polyethylene glycol  17 g Per Tube Daily   revefenacin  175 mcg Nebulization Daily   sertraline  100 mg Per Tube Daily    Continuous Infusions:  feeding supplement (VITAL AF 1.2 CAL) 1,000 mL (05/07/21 1638)   fentaNYL infusion INTRAVENOUS Stopped (05/08/21 0812)   lactated ringers 100 mL/hr at 05/08/21 0637   methocarbamol (ROBAXIN) IV Stopped (05/08/21 0552)   norepinephrine (LEVOPHED) Adult infusion 5 mcg/min (05/08/21 0823)   propofol (DIPRIVAN) infusion Stopped (05/08/21 0810)    PRN Meds: albuterol, fentaNYL, fentaNYL (SUBLIMAZE) injection, fentaNYL (SUBLIMAZE) injection, oxyCODONE  No Known Allergies  Review of Systems  Unable to perform ROS: Intubated  Unless otherwise noted, a complete review of systems is negative.  Physical Exam General: NAD, intubated, critically-ill Cardiovascular: regular rate and rhythm Pulmonary: L chest tube, diminished bilaterally,  ventilated Abdomen: soft, nontender, + bowel sounds Extremities: upper extremity edema, no joint deformities Skin: no rashes, warm and dry, scattered upper arm bruising  Neurological: Will nod head to verbal commands, squeeze hands  Vital Signs:  BP 116/67   Pulse 74   Temp (!) 97.5 F (36.4 C) (Oral)   Resp 15   Ht 5' 10"  (1.778 m)   Wt 85.9 kg   SpO2 100%   BMI 27.17 kg/m  Pain Scale: CPOT   Pain Score: 0-No pain  SpO2: SpO2: 100 % O2 Device:SpO2: 100 % O2 Flow Rate: .O2 Flow Rate (L/min): 10 L/min  IO: Intake/output summary:  Intake/Output Summary (Last 24 hours) at 05/08/2021 1103 Last data filed at 05/08/2021 0635 Gross per 24 hour  Intake 4127.51 ml  Output 3135 ml  Net 992.51 ml    LBM: Last BM Date: 05/06/21 Baseline Weight: Weight: 86.6 kg Most recent weight: Weight: 85.9 kg  Palliative Assessment/Data: Intubated   Advanced Care Planning:   Primary Decision Maker: NEXT OF KIN  Code Status/Advance Care Planning: DNR  A discussion was had today regarding advanced directives. Concepts specific to code status, artifical feeding and hydration, continued IV antibiotics and rehospitalization was had.    The difference between a aggressive medical intervention path and a palliative comfort care path was discussed. Education provided on symptom management and medications to support comfort.   Mrs. Banton is clear in expressed wishes to continue with current treatment today. Confirms DNR with plans for no re-intubation. She speaks to potential for nerve block procedure later today with plans for one-way extubation to BiPAP or nasal cannula on tomorrow.  If patient is unable to maintain his respiratory status family would then wish to focus on his comfort. Wife is tearful expressing she does not wish for him to suffer or be in distress.    Family aware of anticipated hospital death if patient transitions to comfort focused care.    Assessment & Plan:    SUMMARY OF RECOMMENDATIONS   DNR/DNI-as confirmed by wife Continue with current plan of care Wife is clear in expressed goals to one-way extubate to BiPAP or nasal cannula as tolerated. If [name] does not do  well or unable to maintain respiratory status family would then wish to focus on his comfort/EOL with aggressive symptom management with a goal of relieving any distress.   PMT will continue to support and follow. Please call team line with urgent needs.  Palliative Prophylaxis:  Aspiration, Frequent Pain Assessment, and Turn Reposition  Additional Recommendations (Limitations, Scope, Preferences): No Artificial Feeding, No Tracheostomy, and treat the treatable, watching waiting   Psycho-social/Spiritual:  Desire for further Chaplaincy support: yes Additional Recommendations:  Ongoing support and goals of care discussions.   Prognosis:  Guarded-Poor   Discharge Planning:  To Be Determined on hospital course.    Wife expressed understanding and was in agreement with this plan.   Time In: 1105 Time Out: 1215 Time Total: 70 min.   Visit consisted of counseling and education dealing with the complex and emotionally intense issues of symptom management and palliative care in the setting of serious and potentially life-threatening illness.Greater than 50%  of this time was spent counseling and coordinating care related to the above assessment and plan.  Signed by:  Alda Lea, AGPCNP-BC Palliative Medicine Team  Phone: [phone] Pager: [phone] Amion: Bjorn Pippin   Thank you for allowing the Palliative Medicine Team to assist in the care of this patient. Please utilize secure chat with additional questions, if there is no response within 30 minutes please call the above phone number. Palliative Medicine Team providers are available by phone from 7am to 5pm daily and can be reached through the team cell phone.  Should this patient require assistance outside  of these hours, please call the patient's attending physician.

## 2021-05-08 NOTE — Progress Notes (Signed)
RN called Anesthesia, Dr. Ermalene Postin, in regard to thoracic nerve block that was planned to be done tonight at 2230. Per Dr. Ermalene Postin, he and his team was not aware of this procedure was going to be done tonight. Dr. Ermalene Postin advised waiting until morning when the anesthesia team can speak with the CCM Team and family about the procedure and obtaining consent.   Lysbeth Galas, RN with [facility] notified of this plan and she states that she will pass this along to Dr Lucile Shutters and the day-shift team.

## 2021-05-08 NOTE — Progress Notes (Signed)
Mr. Ruedas wife and daughter were updated at bedside. Plans for nerve block tonight and hopeful extubation to bipap tomorrow. If he is failing post-extubation, they stressed that they want to focus on his comfort rather than continuing aggressive measures at that point. RN Yvone Neu present during discussion.  Julian Hy, DO 05/08/21 10:02 AM [facility]

## 2021-05-09 ENCOUNTER — Inpatient Hospital Stay (HOSPITAL_COMMUNITY): Payer: Medicare Other

## 2021-05-09 DIAGNOSIS — J939 Pneumothorax, unspecified: Secondary | ICD-10-CM | POA: Diagnosis not present

## 2021-05-09 DIAGNOSIS — Z515 Encounter for palliative care: Secondary | ICD-10-CM

## 2021-05-09 DIAGNOSIS — Z66 Do not resuscitate: Secondary | ICD-10-CM

## 2021-05-09 DIAGNOSIS — Z7189 Other specified counseling: Secondary | ICD-10-CM | POA: Diagnosis not present

## 2021-05-09 LAB — CBC
HCT: 33.6 % — ABNORMAL LOW (ref 39.0–52.0)
Hemoglobin: 11.4 g/dL — ABNORMAL LOW (ref 13.0–17.0)
MCH: 32.7 pg (ref 26.0–34.0)
MCHC: 33.9 g/dL (ref 30.0–36.0)
MCV: 96.3 fL (ref 80.0–100.0)
Platelets: 175 10*3/uL (ref 150–400)
RBC: 3.49 MIL/uL — ABNORMAL LOW (ref 4.22–5.81)
RDW: 15.5 % (ref 11.5–15.5)
WBC: 10.7 10*3/uL — ABNORMAL HIGH (ref 4.0–10.5)
nRBC: 0.2 % (ref 0.0–0.2)

## 2021-05-09 LAB — TRIGLYCERIDES: Triglycerides: 63 mg/dL (ref <150)

## 2021-05-09 LAB — GLUCOSE, CAPILLARY
Glucose-Capillary: 110 mg/dL — ABNORMAL HIGH (ref 70–99)
Glucose-Capillary: 112 mg/dL — ABNORMAL HIGH (ref 70–99)
Glucose-Capillary: 114 mg/dL — ABNORMAL HIGH (ref 70–99)
Glucose-Capillary: 116 mg/dL — ABNORMAL HIGH (ref 70–99)
Glucose-Capillary: 117 mg/dL — ABNORMAL HIGH (ref 70–99)
Glucose-Capillary: 118 mg/dL — ABNORMAL HIGH (ref 70–99)
Glucose-Capillary: 141 mg/dL — ABNORMAL HIGH (ref 70–99)

## 2021-05-09 LAB — BASIC METABOLIC PANEL
Anion gap: 10 (ref 5–15)
BUN: 40 mg/dL — ABNORMAL HIGH (ref 8–23)
CO2: 32 mmol/L (ref 22–32)
Calcium: 7.8 mg/dL — ABNORMAL LOW (ref 8.9–10.3)
Chloride: 95 mmol/L — ABNORMAL LOW (ref 98–111)
Creatinine, Ser: 1.22 mg/dL (ref 0.61–1.24)
GFR, Estimated: 58 mL/min — ABNORMAL LOW (ref >60)
Glucose, Bld: 137 mg/dL — ABNORMAL HIGH (ref 70–99)
Potassium: 3.2 mmol/L — ABNORMAL LOW (ref 3.5–5.1)
Sodium: 137 mmol/L (ref 135–145)

## 2021-05-09 MED ORDER — CHLORHEXIDINE GLUCONATE 0.12 % MT SOLN
15.0000 mL | Freq: Two times a day (BID) | OROMUCOSAL | Status: DC
  Administered 2021-05-09: 15 mL via OROMUCOSAL
  Filled 2021-05-09: qty 15

## 2021-05-09 MED ORDER — ORAL CARE MOUTH RINSE
15.0000 mL | Freq: Two times a day (BID) | OROMUCOSAL | Status: DC
  Administered 2021-05-09: 15 mL via OROMUCOSAL

## 2021-05-09 MED ORDER — MORPHINE BOLUS VIA INFUSION
2.0000 mg | INTRAVENOUS | Status: DC | PRN
Start: 2021-05-09 — End: 2021-05-09
  Filled 2021-05-09: qty 2

## 2021-05-09 MED ORDER — SENNA 8.6 MG PO TABS
1.0000 | ORAL_TABLET | Freq: Every day | ORAL | Status: DC
  Administered 2021-05-09: 8.6 mg
  Filled 2021-05-09: qty 1

## 2021-05-09 MED ORDER — MORPHINE 100MG IN NS 100ML (1MG/ML) PREMIX INFUSION: 1.0000 mg/h | INTRAVENOUS | Status: DC

## 2021-05-09 MED ORDER — POTASSIUM CHLORIDE 10 MEQ/100ML IV SOLN
10.0000 meq | INTRAVENOUS | Status: AC
  Administered 2021-05-09 (×4): 10 meq via INTRAVENOUS
  Filled 2021-05-09 (×4): qty 100

## 2021-05-09 MED ORDER — ONDANSETRON HCL 4 MG/2ML IJ SOLN: 4.0000 mg | Freq: Four times a day (QID) | INTRAMUSCULAR | Status: DC | PRN

## 2021-05-09 MED ORDER — GLYCOPYRROLATE 0.2 MG/ML IJ SOLN
0.3000 mg | INTRAMUSCULAR | Status: DC | PRN
  Filled 2021-05-09: qty 2

## 2021-05-09 MED ORDER — ONDANSETRON 4 MG PO TBDP
4.0000 mg | ORAL_TABLET | Freq: Four times a day (QID) | ORAL | Status: DC | PRN
  Filled 2021-05-09: qty 1

## 2021-05-09 MED ORDER — POTASSIUM CHLORIDE 20 MEQ PO PACK
20.0000 meq | PACK | ORAL | Status: AC
  Administered 2021-05-09: 20 meq
  Filled 2021-05-09 (×2): qty 1

## 2021-05-09 MED ORDER — LORAZEPAM 2 MG/ML IJ SOLN
1.0000 mg | INTRAMUSCULAR | Status: DC | PRN
  Filled 2021-05-09: qty 1

## 2021-05-09 MED ORDER — HALOPERIDOL LACTATE 5 MG/ML IJ SOLN
0.5000 mg | INTRAMUSCULAR | Status: DC | PRN
  Filled 2021-05-09: qty 1

## 2021-05-09 NOTE — Progress Notes (Signed)
NAME:  [name], MRN:  [id number], DOB:  1933/10/21, LOS: 4 ADMISSION DATE:  04/14/2021, CONSULTATION DATE: 8/29 REFERRING MD: Dr. Bobbye Morton, CHIEF COMPLAINT: Pneumothorax  History of Present Illness:  87 year old male with past medical history as below, which is significant for atrial fibrillation, COPD, obstructive sleep apnea on home BiPAP, partial paralysis of the diaphragm, GERD, and hypertension.  Family describes patient being very sedentary at baseline.  He needs a walker to ambulate due to gait disturbance and can only ambulate about 20 feet before having to rest due to overall fatigue and shortness of breath.  They also describe an overall functional decline for the past couple of years but more specifically after his fall where he suffered subdural hematoma back in December 2021.  He was in his usual state of health until 8/26 when he suffered a fall at home.  Immediately after the arrest he did have some trouble breathing, but otherwise was acting like himself.  On 8/27 when he became lethargic family called EMS.  Imaging done upon arrival to the emergency department demonstrated a large left-sided pneumothorax.  The patient was significantly hypoxemic.  Supplemental oxygen was applied and an emergent chest tube was placed.  He was given sedatives for chest tube placement appropriately, however, shortly after he was difficult to arouse.  ABG demonstrated markedly elevated PaCO2 and he was intubated.  On 8/29 he met criteria for extubation, however, immediately after extubation he developed respiratory distress and required reintubation.  PCCM consulted for optimization of pulmonary issues.  Pertinent  Medical History   has a past medical history of AAA (abdominal aortic aneurysm) ([place]), Afib ([place]), Arthritis, BPH (benign prostatic hyperplasia), COPD (chronic obstructive pulmonary disease) ([place]), Coronary artery disease, Coronary artery ectasia, Depression, Diaphragmatic paralysis,  Dyspnea, Dysrhythmia, GERD (gastroesophageal reflux disease), Gout, HTN (hypertension), Hypercholesteremia, Hypertension, Iron deficiency anemia, Microhematuria, Obesity, OSA (obstructive sleep apnea), Paroxysmal atrial fibrillation ([place]), and PVC (premature ventricular contraction).   Significant Hospital Events: Including procedures, antibiotic start and stop dates in addition to other pertinent events   8/27 admit. CT placed. Intubated. 8/29 failed extubation.   Interim History / Subjective:  Now off all sedation for >24h. Planned intercostal block deferred by anesthesia. Patient denies chest pain.   Objective   Blood pressure 100/64, pulse 78, temperature 98.1 F (36.7 C), temperature source Oral, resp. rate 17, height 5' 10"  (1.778 m), weight 87.5 kg, SpO2 100 %.    Vent Mode: PSV;CPAP FiO2 (%):  [40 %] 40 % Set Rate:  [12 bmp] 12 bmp Vt Set:  [580 mL] 580 mL PEEP:  [5 cmH20] 5 cmH20 Pressure Support:  [10 cmH20] 10 cmH20 Plateau Pressure:  [14 cmH20-18 cmH20] 18 cmH20   Intake/Output Summary (Last 24 hours) at 05/09/2021 0833 Last data filed at 05/09/2021 0800 Gross per 24 hour  Intake 3984.55 ml  Output 4645 ml  Net -660.45 ml    Filed Weights   04/10/2021 1232 05/08/21 0500 05/09/21 0500  Weight: 86.6 kg 85.9 kg 87.5 kg    Examination: General: Critically ill appearing man lying in bed in NAD, intubated, somnolent.  HENT: [place]/AT, eyes anicteric. ETT and OGT in place. Lungs: No air leak or tidaling from chest tube, small amount of bloody drainage from tube. Symmetric, reduced breath sounds anteriorly. Not breathing above the vent. No secretions from ETT.  Cardiovascular: S1S2, RRR Abdomen: soft, NT Extremities:Miild hand and foot edema, no cyanosis.  Derm: Bruising on arms. No rashes. Neuro: RASS -5, pinpoint pupils, no  tracheal cough when suctioned.Marland Kitchen    Resolved Hospital Problem list     Assessment & Plan:   Acute on chronic hypoxemic and hypercarbic respiratory  failure COPD without acute exacerbation OSA on BiPAP Presumed NM weakness, but no formal sniff testing done to confirm this. Left sided pneumothorax, traumatic Deconditioning Acute anemia, likely due to critical illness, some bleeding from fall and pneumothorax  Plan:   - will hold on intercostal block as no signs of significant pain. Able to tolerate SBT with PSV 5/5 with symmetric chest rise and SBI in 60's.  - major potential source of pain following extubation would be chest tube, but currently no pneumothorax. Have taken it off suction and will obtain repeat CXR and plan to remove prior to extubation.  - continue current respiratory therapies.  - hold of further diuresis given rising BUN and TCO2  Best Practice (right click and "Reselect all SmartList Selections" daily)   Diet/type: tubefeeds -  on hold for OR this afternoon DVT prophylaxis: LMWH GI prophylaxis: PPI Lines: N/A Foley:  Yes, and it is still needed Code Status:  DNR Last date of multidisciplinary goals of care discussion [ 05/07/21]  Labs   CBC: Recent Labs  Lab 04/12/2021 1225 05/04/2021 1938 05/06/21 0430 05/06/21 0848 05/07/21 0023 05/07/21 7425 05/07/21 1357 05/08/21 0610 05/09/21 0504  WBC 9.8  --  10.2  --   --  13.6*  --  12.9* 10.7*  NEUTROABS  --   --  8.2*  --   --   --   --   --   --   HGB 12.8*   < > 11.5*   < > 11.6* 11.9* 12.2* 12.8* 11.4*  HCT 39.6   < > 34.9*   < > 34.0* 35.2* 36.0* 38.6* 33.6*  MCV 98.3  --  97.2  --   --  94.9  --  97.7 96.3  PLT 129*  --  143*  --   --  162  --  162 175   < > = values in this interval not displayed.     Basic Metabolic Panel: Recent Labs  Lab 05/06/21 0430 05/06/21 0848 05/06/21 1756 05/07/21 0023 05/07/21 0619 05/07/21 1357 05/07/21 1615 05/08/21 0503 05/09/21 0504  NA 134*   < > 133* 133* 134* 136  --  134* 137  K 3.5   < > 3.0* 4.1 4.7 4.3  --  4.3 3.2*  CL 88*  --  92*  --  97*  --   --  96* 95*  CO2 29  --  29  --  28  --   --  23 32   GLUCOSE 64*  --  96  --  114*  --   --  189* 137*  BUN 19  --  23  --  23  --   --  28* 40*  CREATININE 1.28*  --  1.21  --  1.16  --   --  1.10 1.22  CALCIUM 9.1  --  8.6*  --  8.4*  --   --  8.2* 7.8*  MG 1.3*  --  1.8  --  2.2  --  2.2 1.9  --   PHOS  --   --  1.2*  --  4.8*  --  5.6* 5.5*  --    < > = values in this interval not displayed.    CRITICAL CARE Performed by: Kipp Brood   Total critical care time: 40  minutes  Critical care time was exclusive of separately billable procedures and treating other patients.  Critical care was necessary to treat or prevent imminent or life-threatening deterioration.  Critical care was time spent personally by me on the following activities: development of treatment plan with patient and/or surrogate as well as nursing, discussions with consultants, evaluation of patient's response to treatment, examination of patient, obtaining history from patient or surrogate, ordering and performing treatments and interventions, ordering and review of laboratory studies, ordering and review of radiographic studies, pulse oximetry, re-evaluation of patient's condition and participation in multidisciplinary rounds.  Kipp Brood, MD [facility] ICU Physician [place]  Pager: [phone] Mobile: [phone] After hours: [phone].

## 2021-05-09 NOTE — Procedures (Signed)
Extubation Procedure Note  Patient Details:   Name: [name] DOB: 1934/06/24 MRN: [id number]   Airway Documentation:    Vent end date: 05/09/21 Vent end time: 1329   Evaluation  O2 sats: stable throughout Complications: No apparent complications Patient did tolerate procedure well. Bilateral Breath Sounds: Clear, Diminished   Yes Transitioned directly to Bipap, tolerating well at this time, no distress noted.  Revonda Standard 05/09/2021, 1:32 PM

## 2021-05-09 NOTE — Progress Notes (Signed)
K+ 3.2 Replaced per protocol  

## 2021-05-09 NOTE — Progress Notes (Signed)
Daily Progress Note   Patient Name: [name]       Date: 05/09/2021 DOB: 1934-08-01  Age: 87 y.o. MRN#: [id number] Attending Physician: Kipp Brood, MD Primary Care Physician: [name], MD Admit Date: 05/04/2021  Reason for Consultation/Follow-up: Establishing goals of care  Subjective: Chart Reviewed. Updates Received. Patient Assessed.   Patient is resting. Easily awakened. Denies pain. Extubated at 1329 and tolerated well to BiPAP. He is able to respond to questions appropriately. No acute distress noted. Daughter, Bonnita Nasuti and wife is at the bedside.   We spoke at length and family expressed their appreciation of patient not in any distress and tolerating BiPAP at this time. Extensive discussions regarding next steps with a goal of weaning off of BiPAP allowing for nutrition and ability to sustain with oxygen via nasal cannula. Family verbalized understanding. Discussed hopes he will continue to do as well as expected, however wife realistically speaks to cautious optimism knowing he has multiple co-morbidities.   Dorian Pod, Palliative Chaplain also at the bedside for spiritual support. Family spent time expressing their strong faith belief and continued prayers for patient.   We discussed best case and worst case scenario with plans to continue with current plan of care, watchful waiting, and making decisions day by day as needed. Wife understands patient most likely will not be at baseline and require significant therapy and assistance if able to maintain respiratory state. She verbalizes understanding and appreciation of support.   All questions answered.   Length of Stay: 4 days  Vital Signs: BP 122/73   Pulse 90   Temp 98.9 F (37.2 C) (Oral)   Resp 15   Ht '5\' 10"'$  (1.778 m)   Wt 87.5 kg   SpO2 100%   BMI 27.68 kg/m  SpO2: SpO2: 100 % O2 Device: O2 Device: Bi-PAP O2 Flow Rate: O2 Flow Rate (L/min): 10 L/min  Physical Exam: NAD, BiPAP in place,  ill-appearing RRR Follows commands                Palliative Care Assessment & Plan  HPI: Palliative Care consult requested for goals of care discussion in this 87 y.o. male  with past medical history of atrial fibrillation, BOH, COPD, CAD, AAA, hypertension, OSA (CPAP), and recurrent falls. He was admitted on 04/18/2021 via EMS from home with complaints of fall (05/04/2021) and shortness of breath. During work-up found to have a large pneumothorax. Emergent chest tube placed and subsequently patient required intubation.   Code Status: DNR  Goals of Care/Recommendations: Continue with current plan of care. Patient tolerating BiPAP. Education provided on weaning process with the need to offer nutrition and evaluate ability to sustain on nasal cannula.  Family is remaining hopeful, plans remain for no re-intubation and if patient was to decline their request would then be to focus on his comfort.  PMT will continue to support and follow. Please call team line with urgent needs.   Prognosis: Guarded  Discharge Planning: To Be Determined pending hospital course.   Thank you for allowing the Palliative Medicine Team to assist in the care of this patient.  Time Total: 50 min   Visit consisted of counseling and education dealing with the complex and emotionally intense issues of symptom management and palliative care in the setting of serious and potentially life-threatening illness.Greater than 50%  of this time was spent counseling and coordinating care related to the above assessment and plan.  Alda Lea, AGPCNP-BC  Palliative Medicine Team [phone]

## 2021-05-09 NOTE — Progress Notes (Signed)
This chaplain responded to PMT referral for Pt. and family spiritual care after the Pt. extubation. The chaplain was updated by the Pt. RN-Andie.  At the time of the visit,  PMT NP-Nikki, the Pt. wife-[name] and daughter-Helen are at the Pt. bedside.  The Pt. is resting comfortably.  This chaplain listened reflectively as the family talked about their love of  family and the Pt. successful transition to [place].  The family is leaning on their faith in God and power of prayer for an extra layer of support with family.  The family accepted the chaplain's invitation for intercessory prayer and F/U spiritual care.  [place] [phone]

## 2021-05-10 ENCOUNTER — Ambulatory Visit: Payer: Medicare Other

## 2021-05-10 DIAGNOSIS — Z66 Do not resuscitate: Secondary | ICD-10-CM | POA: Diagnosis not present

## 2021-05-10 DIAGNOSIS — Z515 Encounter for palliative care: Secondary | ICD-10-CM | POA: Diagnosis not present

## 2021-05-10 DIAGNOSIS — Z7189 Other specified counseling: Secondary | ICD-10-CM | POA: Diagnosis not present

## 2021-05-10 DIAGNOSIS — J939 Pneumothorax, unspecified: Secondary | ICD-10-CM | POA: Diagnosis not present

## 2021-05-10 LAB — CBC
HCT: 39.3 % (ref 39.0–52.0)
Hemoglobin: 12.4 g/dL — ABNORMAL LOW (ref 13.0–17.0)
MCH: 31.2 pg (ref 26.0–34.0)
MCHC: 31.6 g/dL (ref 30.0–36.0)
MCV: 98.7 fL (ref 80.0–100.0)
Platelets: 210 10*3/uL (ref 150–400)
RBC: 3.98 MIL/uL — ABNORMAL LOW (ref 4.22–5.81)
RDW: 15.9 % — ABNORMAL HIGH (ref 11.5–15.5)
WBC: 12.2 10*3/uL — ABNORMAL HIGH (ref 4.0–10.5)
nRBC: 0.2 % (ref 0.0–0.2)

## 2021-05-10 LAB — BASIC METABOLIC PANEL
Anion gap: 10 (ref 5–15)
BUN: 45 mg/dL — ABNORMAL HIGH (ref 8–23)
CO2: 29 mmol/L (ref 22–32)
Calcium: 8.1 mg/dL — ABNORMAL LOW (ref 8.9–10.3)
Chloride: 97 mmol/L — ABNORMAL LOW (ref 98–111)
Creatinine, Ser: 1.05 mg/dL (ref 0.61–1.24)
GFR, Estimated: >60 mL/min (ref >60)
Glucose, Bld: 94 mg/dL (ref 70–99)
Potassium: 5 mmol/L (ref 3.5–5.1)
Sodium: 136 mmol/L (ref 135–145)

## 2021-05-10 LAB — GLUCOSE, CAPILLARY
Glucose-Capillary: 85 mg/dL (ref 70–99)
Glucose-Capillary: 103 mg/dL — ABNORMAL HIGH (ref 70–99)

## 2021-05-10 MED ORDER — ACETAMINOPHEN 650 MG RE SUPP: 650.0000 mg | Freq: Four times a day (QID) | RECTAL | Status: DC | PRN

## 2021-05-10 MED ORDER — MORPHINE 100MG IN NS 100ML (1MG/ML) PREMIX INFUSION
0.0000 mg/h | INTRAVENOUS | Status: DC
  Administered 2021-05-10: 5 mg/h via INTRAVENOUS
  Filled 2021-05-10: qty 100

## 2021-05-10 MED ORDER — DIPHENHYDRAMINE HCL 50 MG/ML IJ SOLN: 25.0000 mg | INTRAMUSCULAR | Status: DC | PRN

## 2021-05-10 MED ORDER — ACETAMINOPHEN 325 MG PO TABS: 650.0000 mg | ORAL_TABLET | Freq: Four times a day (QID) | ORAL | Status: DC | PRN

## 2021-05-10 MED ORDER — MORPHINE SULFATE (PF) 2 MG/ML IV SOLN: 2.0000 mg | INTRAVENOUS | Status: DC | PRN

## 2021-05-10 MED ORDER — DEXTROSE 5 % IV SOLN: INTRAVENOUS | Status: DC

## 2021-05-10 MED ORDER — LORAZEPAM 2 MG/ML IJ SOLN: 2.0000 mg | INTRAMUSCULAR | Status: DC | PRN

## 2021-05-10 MED ORDER — POLYVINYL ALCOHOL 1.4 % OP SOLN
1.0000 [drp] | Freq: Four times a day (QID) | OPHTHALMIC | Status: DC | PRN
  Filled 2021-05-10: qty 15

## 2021-05-10 MED ORDER — GLYCOPYRROLATE 1 MG PO TABS
1.0000 mg | ORAL_TABLET | ORAL | Status: DC | PRN
  Filled 2021-05-10: qty 1

## 2021-05-10 MED ORDER — MORPHINE BOLUS VIA INFUSION
5.0000 mg | INTRAVENOUS | Status: DC | PRN
  Filled 2021-05-10: qty 5

## 2021-05-10 MED ORDER — GLYCOPYRROLATE 0.2 MG/ML IJ SOLN: 0.2000 mg | INTRAMUSCULAR | Status: DC | PRN

## 2021-05-10 DEATH — deceased

## 2021-05-15 ENCOUNTER — Ambulatory Visit: Payer: Medicare Other

## 2021-05-17 ENCOUNTER — Ambulatory Visit: Payer: Medicare Other

## 2021-06-09 NOTE — Progress Notes (Signed)
    Patient declined early this morning and family made decisions to transition all care to focus on comfort.   Wife and daughter at the bedside. Patient passed away while at the bedside. Emotional support offered to family.   Education provided on grief support and next steps to release the body. Family appreciative of all the support and care.   Time Total: 35 min.   Visit consisted of counseling and education dealing with the complex and emotionally intense issues of symptom management and palliative care in the setting of serious and potentially life-threatening illness.Greater than 50%  of this time was spent counseling and coordinating care related to the above assessment and plan.  Alda Lea, AGPCNP-BC  Palliative Medicine Team [phone]

## 2021-06-09 NOTE — Progress Notes (Signed)
Nutrition Brief Note  Chart reviewed. Pt now transitioning to comfort care. Currently on BiPap, NPO.  No further nutrition interventions planned at this time.  Please re-consult as needed.   Kerman Passey MS, RDN, LDN, CNSC Registered Dietitian III Clinical Nutrition RD Pager and On-Call Pager Number Located in [place]

## 2021-06-09 NOTE — Progress Notes (Signed)
Family of patient (daughterBonnita Nasuti) called and updated that patient is becoming more lethargic and less responsive. She expresses not wanting to advance treatment and will come in with patients spouse ASAP.  E-link also notified.

## 2021-06-09 NOTE — Discharge Summary (Signed)
DEATH SUMMARY   Patient Details  Name: [name] MRN: [id number] DOB: 04-10-1934  Admission/Discharge Information   Admit Date:  May 25, 2021  Date of Death: Date of Death: 2021/05/30  Time of Death: Time of Death: 12/16/50  Length of Stay: 5  Referring Physician: [name], MD   Reason(s) for Hospitalization  Fall  Diagnoses  Preliminary cause of death:   Acute hypoxic and hypercarbic respiratory failure Secondary Diagnoses (including complications and co-morbidities):  Active Problems:   Pneumothorax, left Atrial fibrillation Obstructive sleep apnea Partial paralysis of the diaphragm GERD Hypertension Frailty Status post subdural hematoma  Brief Hospital Course (including significant findings, care, treatment, and services provided and events leading to death)  [name] is a 87 y.o. year old male who suffered a fall at home and developed difficulty breathing.  He was brought to the ED for increasing lethargy.  He was found to have a large left pneumothorax.  Despite chest tube placement he continued lethargic and was intubated for hypercarbic respiratory failure.  He has been in declining health over the past 8 months due to gait disturbances and dyspnea.  He was functionally sedentary prior to admission.  Despite optimization he failed 2 trials of extubation.  On the second 1, he immediately required reinitiation of BiPAP and in keeping with previous discussions with the family it was decided to transition him to comfort care at that time.  A morphine infusion was initiated and BiPAP was discontinued and the patient passed away without distress.    Pertinent Labs and Studies  Significant Diagnostic Studies CT HEAD WO CONTRAST (5MM)  Result Date: 05-25-21 CLINICAL DATA:  Head trauma, minor (Age >= 65y) EXAM: CT HEAD WITHOUT CONTRAST CT CERVICAL SPINE WITHOUT CONTRAST TECHNIQUE: Multidetector CT imaging of the head and cervical spine was performed following  the standard protocol without intravenous contrast. Multiplanar CT image reconstructions of the cervical spine were also generated. COMPARISON:  August 15, 2020 FINDINGS: Evaluation is limited secondary to motion. CT HEAD FINDINGS Brain: No evidence of acute infarction, hemorrhage, hydrocephalus, extra-axial collection or mass lesion/mass effect. Periventricular white matter hypodensities consistent with sequela of chronic microvascular ischemic disease. Vascular: Vascular calcifications. Skull: No acute fracture. Sinuses/Orbits: No acute finding. Other: None. CT CERVICAL SPINE FINDINGS Alignment: Normal. Skull base and vertebrae: No acute vertebral body fracture. There is new lucency through an inferior endplate osteophyte of C6 since prior in 12/16/2019. Soft tissues and spinal canal: No prevertebral fluid or swelling. No visible canal hematoma. Disc levels: Multilevel endplate proliferative changes. Relative preservation of the disc spaces. Mild bilateral facet arthropathy. Upper chest: Similar appearance of LEFT apical scarring. Other: None. IMPRESSION: 1.  No acute intracranial abnormality. 2. There is new lucency through an inferior endplate osteophyte of C6. Findings may reflect an osteophyte fracture, age indeterminate, which could reflect a source of pain. Recommend correlation with clinical symptomatology. This could be further assessed with dedicated MRI if clinically indicated. Otherwise no evidence of acute cervical spine fracture. Electronically Signed   By: Valentino Saxon M.D.   On: 2021/05/25 15:54   CT Chest W Contrast  Result Date: 05-25-2021 CLINICAL DATA:  Status post trauma with new chest tube. EXAM: CT CHEST, ABDOMEN, AND PELVIS WITH CONTRAST TECHNIQUE: Multidetector CT imaging of the chest, abdomen and pelvis was performed following the standard protocol during bolus administration of intravenous contrast. CONTRAST:  126m OMNIPAQUE IOHEXOL 350 MG/ML SOLN COMPARISON:  Chest x-ray  009/16/22 CT chest 01/06/2019, chest x-ray 0September 16, 2022 CT abdomen pelvis 07/25/2005  FINDINGS: CHEST: Ports and Devices: Left inferolateral chest tube noted within the left pleural space with tip terminating at the apex. High density debris (41 Hounsfield units) is noted within the chest tube most distally along chest tube holes) 19:70, 14:41). Lungs/airways: Passive atelectasis of the left lower lobe with partial collapse of left lower lobe. Similar passive atelectasis and partial collapse of the right lower lobe. No focal consolidation. No pulmonary nodule. No pulmonary mass. No pulmonary contusion or laceration. No pneumatocele formation. The central airways are patent. Pleura: Persistent trace to small volume left pneumothorax and associated trace to small volume pleural fluid measuring up to 37 Hounsfield possible hemothorax. No definite hemothorax or pneumothorax on the right. Simple free fluid trace volume right pleural effusion Lymph Nodes: No mediastinal, hilar, or axillary lymphadenopathy. Mediastinum: Chronic elevated left hemidiaphragm with chronic left to right mediastinal shift. No pneumomediastinum. No aortic injury or mediastinal hematoma. The thoracic aorta is normal in caliber. Moderate calcified and noncalcified atherosclerotic plaque. Four-vessel coronary artery calcifications. Mild prominence of the heart. No significant pericardial effusion. The esophagus is unremarkable. The thyroid is unremarkable. Chest Wall / Breasts: Mild subcutaneus soft tissue edema and emphysema along the left chest wall likely due to chest tube insertion. No chest wall mass. Musculoskeletal: Likely old healed nondisplaced right rib fractures. No acute displaced rib or sternal fracture. No thoracic spinal fracture. Please see separately in dictated CT cervical spine 05/02/2021. ABDOMEN / PELVIS: Liver: Similar-appearing several scattered subcentimeter hepatic hypodensities are too small to characterize.  Similar-appearing 2.1 cm fluid density subcapsular lesion along the inferior right hepatic lobe (19:58). No new focal lesion. No laceration or subcapsular hematoma. Biliary System: Status post cholecystectomy. No intrahepatic biliary ductal dilatation. Prominent common bile duct in the setting of post cholecystectomy which appears stable compared to prior. Pancreas: Stable 1.3 cm fluid density lesion within the proximal pancreatic body (13:64). No new pancreatic lesion. Normal pancreatic contour. No main pancreatic duct dilatation. Spleen: Not enlarged. No focal lesion. No laceration, subcapsular hematoma, or vascular injury. Adrenal Glands: Bilateral adrenal gland hyperplasia with no nodularity. Kidneys: Bilateral kidneys enhance symmetrically. No hydronephrosis. No contusion, laceration, or subcapsular hematoma. Several fluid density lesions within the kidneys likely represent simple renal cysts and appear grossly stable. There is a stable appearing (as far back as 2006) 2.3 cm hyperdense lesion within the right kidney with a density of 52 Hounsfield units. No injury to the vascular structures or collecting systems. No hydroureter. The urinary bladder is mildly distended with urine and grossly unremarkable. Bowel: No small or large bowel wall thickening or dilatation. Redundant sigmoid colon. The appendix not definitely identified with no right lower quadrant inflammatory changes. Mesentery, Omentum, and Peritoneum: No simple free fluid ascites. No pneumoperitoneum. No hemoperitoneum. No mesenteric hematoma identified. No organized fluid collection. Pelvic Organs: Enlarged prostate measuring up to 5.9 cm. Lymph Nodes: No abdominal, pelvic, inguinal lymphadenopathy. Vasculature: Stable infrarenal aneurysmal aorta measuring up to 3.3 cm in caliber on axial imaging (13:77). The common iliac arteries measure at the upper limits of normal (1.5 cm (13:93). Atherosclerotic plaque. No active contrast extravasation or  pseudoaneurysm. Musculoskeletal: No significant soft tissue hematoma.  Diffuse muscular atrophy. No acute pelvic fracture. No spinal fracture. IMPRESSION: 1. Persistent trace to small volume left hemopneumothorax status post left chest tube placement that appears in appropriate position with debris noted within its distal lumen. No definite tension component with chronic left to right mediastinal shift due to chronic elevated left hemidiaphragm. 2. Trace right pleural effusion. 3.  No acute traumatic injury to the abdomen or pelvis. 4. No acute fracture or traumatic malalignment of the thoracic or lumbar spine. 5. Please see separately in dictated CT head and cervical spine 05/04/2021. Other imaging findings of potential clinical significance: 1. Cardiomegaly. 2. Prostatomegaly. 3. Stable 1.3 cm fluid density lesion within the proximal pancreatic body. Recommend follow up pre and post contrast MRI/MRCP or pancreatic protocol CT in 2 years. This recommendation follows ACR consensus guidelines: Management of Incidental Pancreatic Cysts: A White Paper of the ACR Incidental Findings Committee. [place] [id number]. 4. Stable in size and appearance of a 2.3 cm right renal lesion (as far back as 2006). Given stability, finding likely represents a hemorrhagic/proteinaceous cyst. 5. Aortic aneurysm NOS (ICD10-I71.9). Stable infrarenal abdominal aorta aneurysm (3.3 cm) Recommend follow-up ultrasound every 3 years. This recommendation follows ACR consensus guidelines: White Paper of the ACR Incidental Findings Committee II on Vascular Findings. J Am Coll Radiol [id number]. 6. Aortic Atherosclerosis (ICD10-I70.0) including four-vessel coronary artery calcifications. Electronically Signed   By: Iven Finn M.D.   On: 05/02/2021 16:09   CT Cervical Spine Wo Contrast  Result Date: 04/13/2021 CLINICAL DATA:  Head trauma, minor (Age >= 65y) EXAM: CT HEAD WITHOUT CONTRAST CT CERVICAL SPINE WITHOUT CONTRAST  TECHNIQUE: Multidetector CT imaging of the head and cervical spine was performed following the standard protocol without intravenous contrast. Multiplanar CT image reconstructions of the cervical spine were also generated. COMPARISON:  August 15, 2020 FINDINGS: Evaluation is limited secondary to motion. CT HEAD FINDINGS Brain: No evidence of acute infarction, hemorrhage, hydrocephalus, extra-axial collection or mass lesion/mass effect. Periventricular white matter hypodensities consistent with sequela of chronic microvascular ischemic disease. Vascular: Vascular calcifications. Skull: No acute fracture. Sinuses/Orbits: No acute finding. Other: None. CT CERVICAL SPINE FINDINGS Alignment: Normal. Skull base and vertebrae: No acute vertebral body fracture. There is new lucency through an inferior endplate osteophyte of C6 since prior in 2021. Soft tissues and spinal canal: No prevertebral fluid or swelling. No visible canal hematoma. Disc levels: Multilevel endplate proliferative changes. Relative preservation of the disc spaces. Mild bilateral facet arthropathy. Upper chest: Similar appearance of LEFT apical scarring. Other: None. IMPRESSION: 1.  No acute intracranial abnormality. 2. There is new lucency through an inferior endplate osteophyte of C6. Findings may reflect an osteophyte fracture, age indeterminate, which could reflect a source of pain. Recommend correlation with clinical symptomatology. This could be further assessed with dedicated MRI if clinically indicated. Otherwise no evidence of acute cervical spine fracture. Electronically Signed   By: Valentino Saxon M.D.   On: 05/02/2021 15:54   CT ABDOMEN PELVIS W CONTRAST  Result Date: 04/12/2021 CLINICAL DATA:  Status post trauma with new chest tube. EXAM: CT CHEST, ABDOMEN, AND PELVIS WITH CONTRAST TECHNIQUE: Multidetector CT imaging of the chest, abdomen and pelvis was performed following the standard protocol during bolus administration of  intravenous contrast. CONTRAST:  149m OMNIPAQUE IOHEXOL 350 MG/ML SOLN COMPARISON:  Chest x-ray 05/04/2021. CT chest 01/06/2019, chest x-ray 04/12/2021. CT abdomen pelvis 07/25/2005 FINDINGS: CHEST: Ports and Devices: Left inferolateral chest tube noted within the left pleural space with tip terminating at the apex. High density debris (41 Hounsfield units) is noted within the chest tube most distally along chest tube holes) 19:70, 14:41). Lungs/airways: Passive atelectasis of the left lower lobe with partial collapse of left lower lobe. Similar passive atelectasis and partial collapse of the right lower lobe. No focal consolidation. No pulmonary nodule. No pulmonary mass. No pulmonary contusion or  laceration. No pneumatocele formation. The central airways are patent. Pleura: Persistent trace to small volume left pneumothorax and associated trace to small volume pleural fluid measuring up to 37 Hounsfield possible hemothorax. No definite hemothorax or pneumothorax on the right. Simple free fluid trace volume right pleural effusion Lymph Nodes: No mediastinal, hilar, or axillary lymphadenopathy. Mediastinum: Chronic elevated left hemidiaphragm with chronic left to right mediastinal shift. No pneumomediastinum. No aortic injury or mediastinal hematoma. The thoracic aorta is normal in caliber. Moderate calcified and noncalcified atherosclerotic plaque. Four-vessel coronary artery calcifications. Mild prominence of the heart. No significant pericardial effusion. The esophagus is unremarkable. The thyroid is unremarkable. Chest Wall / Breasts: Mild subcutaneus soft tissue edema and emphysema along the left chest wall likely due to chest tube insertion. No chest wall mass. Musculoskeletal: Likely old healed nondisplaced right rib fractures. No acute displaced rib or sternal fracture. No thoracic spinal fracture. Please see separately in dictated CT cervical spine 05/03/2021. ABDOMEN / PELVIS: Liver: Similar-appearing  several scattered subcentimeter hepatic hypodensities are too small to characterize. Similar-appearing 2.1 cm fluid density subcapsular lesion along the inferior right hepatic lobe (19:58). No new focal lesion. No laceration or subcapsular hematoma. Biliary System: Status post cholecystectomy. No intrahepatic biliary ductal dilatation. Prominent common bile duct in the setting of post cholecystectomy which appears stable compared to prior. Pancreas: Stable 1.3 cm fluid density lesion within the proximal pancreatic body (13:64). No new pancreatic lesion. Normal pancreatic contour. No main pancreatic duct dilatation. Spleen: Not enlarged. No focal lesion. No laceration, subcapsular hematoma, or vascular injury. Adrenal Glands: Bilateral adrenal gland hyperplasia with no nodularity. Kidneys: Bilateral kidneys enhance symmetrically. No hydronephrosis. No contusion, laceration, or subcapsular hematoma. Several fluid density lesions within the kidneys likely represent simple renal cysts and appear grossly stable. There is a stable appearing (as far back as 2006) 2.3 cm hyperdense lesion within the right kidney with a density of 52 Hounsfield units. No injury to the vascular structures or collecting systems. No hydroureter. The urinary bladder is mildly distended with urine and grossly unremarkable. Bowel: No small or large bowel wall thickening or dilatation. Redundant sigmoid colon. The appendix not definitely identified with no right lower quadrant inflammatory changes. Mesentery, Omentum, and Peritoneum: No simple free fluid ascites. No pneumoperitoneum. No hemoperitoneum. No mesenteric hematoma identified. No organized fluid collection. Pelvic Organs: Enlarged prostate measuring up to 5.9 cm. Lymph Nodes: No abdominal, pelvic, inguinal lymphadenopathy. Vasculature: Stable infrarenal aneurysmal aorta measuring up to 3.3 cm in caliber on axial imaging (13:77). The common iliac arteries measure at the upper limits of  normal (1.5 cm (13:93). Atherosclerotic plaque. No active contrast extravasation or pseudoaneurysm. Musculoskeletal: No significant soft tissue hematoma.  Diffuse muscular atrophy. No acute pelvic fracture. No spinal fracture. IMPRESSION: 1. Persistent trace to small volume left hemopneumothorax status post left chest tube placement that appears in appropriate position with debris noted within its distal lumen. No definite tension component with chronic left to right mediastinal shift due to chronic elevated left hemidiaphragm. 2. Trace right pleural effusion. 3. No acute traumatic injury to the abdomen or pelvis. 4. No acute fracture or traumatic malalignment of the thoracic or lumbar spine. 5. Please see separately in dictated CT head and cervical spine 04/14/2021. Other imaging findings of potential clinical significance: 1. Cardiomegaly. 2. Prostatomegaly. 3. Stable 1.3 cm fluid density lesion within the proximal pancreatic body. Recommend follow up pre and post contrast MRI/MRCP or pancreatic protocol CT in 2 years. This recommendation follows ACR consensus guidelines: Management of  Incidental Pancreatic Cysts: A White Paper of the ACR Incidental Findings Committee. [place] [id number]. 4. Stable in size and appearance of a 2.3 cm right renal lesion (as far back as 2006). Given stability, finding likely represents a hemorrhagic/proteinaceous cyst. 5. Aortic aneurysm NOS (ICD10-I71.9). Stable infrarenal abdominal aorta aneurysm (3.3 cm) Recommend follow-up ultrasound every 3 years. This recommendation follows ACR consensus guidelines: White Paper of the ACR Incidental Findings Committee II on Vascular Findings. J Am Coll Radiol [id number]. 6. Aortic Atherosclerosis (ICD10-I70.0) including four-vessel coronary artery calcifications. Electronically Signed   By: Iven Finn M.D.   On: 05/02/2021 16:09   DG CHEST PORT 1 VIEW  Result Date: 05/09/2021 CLINICAL DATA:  Chest tube clamped EXAM:  PORTABLE CHEST 1 VIEW COMPARISON:  05/08/2021 FINDINGS: Left chest tube remains in place.  No pneumothorax. Bibasilar atelectasis/infiltrate with interval improvement. Small left effusion. Endotracheal tube in good position.  NG tube in the gastric fundus. IMPRESSION: No pneumothorax.  Improved aeration lung bases. Electronically Signed   By: Franchot Gallo M.D.   On: 05/09/2021 14:00   DG Chest Port 1 View  Result Date: 05/08/2021 CLINICAL DATA:  Respiratory failure EXAM: PORTABLE CHEST 1 VIEW COMPARISON:  05/07/2021 FINDINGS: Endotracheal tube terminates approximately 3.1 cm above the carina. Interval placement of enteric tube with distal tip terminating in the region of the gastric fundus. Left-sided chest tube remains unchanged in positioning. Stable cardiomediastinal contours. Streaky bibasilar opacities, left worse than right. Probable small bilateral pleural effusions. No discernible pneumothorax. IMPRESSION: 1. Persistent bibasilar opacities, left worse than right. No significant interval change from prior. 2. Lines and tubes, as above. Electronically Signed   By: Davina Poke D.O.   On: 05/08/2021 08:22   DG CHEST PORT 1 VIEW  Result Date: 05/07/2021 CLINICAL DATA:  History of recent extubation with subsequent re-intubation EXAM: PORTABLE CHEST 1 VIEW COMPARISON:  Film from earlier in the same day. FINDINGS: Endotracheal tube has been replaced and now lies 3.4 cm above the carina. Left-sided chest tube remains in satisfactory position. No pneumothorax is seen. Bibasilar atelectatic changes are noted. Linear density is noted in the expected region of the right jugular vein and SVC which may represent a long jugular sheath. Correlation with physical exam is recommended. IMPRESSION: Endotracheal tube as described in satisfactory position. Electronically Signed   By: Inez Catalina M.D.   On: 05/07/2021 14:33   DG CHEST PORT 1 VIEW  Result Date: 05/07/2021 CLINICAL DATA:  Endotracheal tube, chest  tube present, left pneumothorax EXAM: PORTABLE CHEST 1 VIEW COMPARISON:  05/06/2021 FINDINGS: Endotracheal tube, enteric tube, and left chest tube are again identified. Lung volumes remain low. There is slightly improved aeration at the right lung base. Persistent right pleural effusion. Similar cardiomediastinal contours. No pneumothorax. IMPRESSION: Stable lines and tubes. Slightly improved aeration at the right lung base. Persistent right pleural effusion. No pneumothorax. Electronically Signed   By: Macy Mis M.D.   On: 05/07/2021 07:48   DG CHEST PORT 1 VIEW  Result Date: 05/06/2021 CLINICAL DATA:  87 year old male status post fall, found down. Left pneumothorax with chest tube. Respiratory distress. EXAM: PORTABLE CHEST 1 VIEW COMPARISON:  Portable chest 0537 hours today. FINDINGS: Portable AP semi upright view at 0719 hours. Stable lines and tubes. No pneumothorax. Continued veiling opacity in both lower lungs, not significantly changed since yesterday and compatible with the combined pleural effusions and collapse/consolidation demonstrated by CT. No pulmonary edema. Stable cardiac size and mediastinal contours. IMPRESSION: 1.  Stable lines and tubes.  No pneumothorax. 2. Stable ventilation since 0537 hours today with bilateral pleural effusions and lung base collapse or consolidation. Electronically Signed   By: Genevie Ann M.D.   On: 05/06/2021 07:43   DG CHEST PORT 1 VIEW  Result Date: 05/06/2021 CLINICAL DATA:  87 year old male status post fall, found down. Left pneumothorax with chest tube. EXAM: PORTABLE CHEST 1 VIEW COMPARISON:  Chest radiographs and CT 04/19/2021, and earlier. FINDINGS: Portable AP semi upright view at 0537 hours. Stable left chest tube. ET tube tip is stable just below the clavicles. Enteric tube is stable terminating in the stomach. No pneumothorax identified. Stable cardiac size and mediastinal contours. Continued evidence of bilateral pleural effusions and lung base  atelectasis, not significantly changed. No pulmonary edema. No areas of worsening ventilation. Negative visible bowel gas pattern. IMPRESSION: 1. Stable lines and tubes. No pneumothorax identified. 2. Stable ventilation with continued bilateral pleural effusions and atelectasis. Electronically Signed   By: Genevie Ann M.D.   On: 05/06/2021 05:58   DG Chest Portable 1 View  Result Date: 04/27/2021 CLINICAL DATA:  Inhibition. EXAM: PORTABLE CHEST 1 VIEW COMPARISON:  Chest x-ray from earlier same day. FINDINGS: Endotracheal tube is well position with tip approximately 3 cm above the carina. Heart size and mediastinal contours are grossly stable. Interval re-expansion of the LEFT lung status post chest tube advancement. Enteric tube passes below the diaphragm. Probable small bilateral pleural effusions. IMPRESSION: 1. Endotracheal tube adequately positioned with tip approximately 3 cm above the carina. 2. Interval re-expansion of the LEFT lung status post chest tube advancement. 3. Probable small bilateral pleural effusions. Electronically Signed   By: Franki Cabot M.D.   On: 05/03/2021 18:07   DG Chest Portable 1 View  Result Date: 05/06/2021 CLINICAL DATA:  Chest tube decompression. EXAM: PORTABLE CHEST 1 VIEW COMPARISON:  Three chest x-rays from earlier May 05, 2021. FINDINGS: The left chest tube is been advanced. A second catheter is now projected over the left chest. The left-sided pneumothorax which is large is unchanged. The cardiomediastinal silhouette is stable. Mild effusion and opacity on the right is stable. No other changes. IMPRESSION: 1. The left chest tube has been advanced and a second catheter projects over the left hemithorax. No change in the large pneumothorax. Electronically Signed   By: Dorise Bullion III M.D.   On: 05/09/2021 13:55   DG Chest Portable 1 View  Result Date: 04/14/2021 CLINICAL DATA:  Chest tube repositioning. EXAM: PORTABLE CHEST 1 VIEW COMPARISON:  Chest x-ray from  same day at 1321 hours. FINDINGS: The patient is rotated to the right. Stable cardiomediastinal silhouette with rightward tracheal deviation. Interval advancement of the left chest tube with unchanged large left pneumothorax. Unchanged small right pleural effusion with adjacent basilar atelectasis. IMPRESSION: 1. Interval advancement of the left chest tube with unchanged large left pneumothorax. Electronically Signed   By: Titus Dubin M.D.   On: 04/20/2021 13:47   DG Chest Portable 1 View  Result Date: 04/18/2021 CLINICAL DATA:  Post chest tube placement. EXAM: PORTABLE CHEST 1 VIEW COMPARISON:  Chest x-ray from same day at 1228 hours. FINDINGS: The patient is rotated to the right. Stable cardiomediastinal silhouette with rightward tracheal deviation. New left-sided chest tube noted with unchanged large left pneumothorax. Unchanged small right pleural effusion and right basilar atelectasis. IMPRESSION: 1. New left-sided chest tube with unchanged large left pneumothorax. 2. Unchanged small right pleural effusion and right basilar atelectasis. Electronically Signed   By: Gwyndolyn Saxon  Marzella Schlein M.D.   On: 04/28/2021 13:45   DG Chest Portable 1 View  Addendum Date: 04/24/2021   ADDENDUM REPORT: 04/30/2021 12:46 ADDENDUM: These results were called by telephone on 04/10/2021 at 12:46 pm to [place], who verbally acknowledged these results. Electronically Signed   By: Valentino Saxon M.D.   On: 05/01/2021 12:46   Result Date: 05/09/2021 CLINICAL DATA:  sob EXAM: PORTABLE CHEST 1 VIEW COMPARISON:  August 15, 2020 . FINDINGS: The cardiomediastinal silhouette is unchanged in contour.Small RIGHT pleural effusion. No LEFT pleural effusion. There is a moderate to large LEFT-sided pneumothorax. Moderately increased degree of RIGHT-sided mediastinal shift in comparison to more remote prior. Bibasilar opacities, likely atelectasis. Visualized abdomen is unremarkable. Remote RIGHT-sided rib fractures. IMPRESSION: Moderate  to large LEFT-sided pneumothorax with mildly increased RIGHT-sided midline shift which could reflect underlying tension physiology. PRA system was activated at the time of interpretation on 05/02/2021 at 12:39 pm. An addendum will be issued upon communication of these results. Electronically Signed: By: Valentino Saxon M.D. On: 05/01/2021 12:43   DG Abd Portable 1V  Result Date: 05/07/2021 CLINICAL DATA:  Check gastric catheter placement EXAM: PORTABLE ABDOMEN - 1 VIEW COMPARISON:  None. FINDINGS: Gastric catheter is noted within the stomach. Scattered large and small bowel gas is noted. IMPRESSION: Gastric catheter within the stomach. Electronically Signed   By: Inez Catalina M.D.   On: 05/07/2021 14:34    Microbiology No results found for this or any previous visit (from the past 240 hour(s)).  Lab Basic Metabolic Panel: No results for input(s): NA, K, CL, CO2, GLUCOSE, BUN, CREATININE, CALCIUM, MG, PHOS in the last 168 hours. Liver Function Tests: No results for input(s): AST, ALT, ALKPHOS, BILITOT, PROT, ALBUMIN in the last 168 hours. No results for input(s): LIPASE, AMYLASE in the last 168 hours. No results for input(s): AMMONIA in the last 168 hours. CBC: No results for input(s): WBC, NEUTROABS, HGB, HCT, MCV, PLT in the last 168 hours. Cardiac Enzymes: No results for input(s): CKTOTAL, CKMB, CKMBINDEX, TROPONINI in the last 168 hours. Sepsis Labs: No results for input(s): PROCALCITON, WBC, LATICACIDVEN in the last 168 hours.  Procedures/Operations  Mechanical ventilation, intubation, chest tube insertion.   Lauralee Waters 05/24/2021, 12:14 PM

## 2021-06-09 NOTE — Progress Notes (Signed)
NAME:  [name], MRN:  [id number], DOB:  Feb 02, 1934, LOS: 5 ADMISSION DATE:  04/16/2021, CONSULTATION DATE: 8/29 REFERRING MD: Dr. Bobbye Morton, CHIEF COMPLAINT: Pneumothorax  History of Present Illness:  87 year old male with past medical history as below, which is significant for atrial fibrillation, COPD, obstructive sleep apnea on home BiPAP, partial paralysis of the diaphragm, GERD, and hypertension.  Family describes patient being very sedentary at baseline.  He needs a walker to ambulate due to gait disturbance and can only ambulate about 20 feet before having to rest due to overall fatigue and shortness of breath.  They also describe an overall functional decline for the past couple of years but more specifically after his fall where he suffered subdural hematoma back in December 2021.  He was in his usual state of health until 8/26 when he suffered a fall at home.  Immediately after the arrest he did have some trouble breathing, but otherwise was acting like himself.  On 8/27 when he became lethargic family called EMS.  Imaging done upon arrival to the emergency department demonstrated a large left-sided pneumothorax.  The patient was significantly hypoxemic.  Supplemental oxygen was applied and an emergent chest tube was placed.  He was given sedatives for chest tube placement appropriately, however, shortly after he was difficult to arouse.  ABG demonstrated markedly elevated PaCO2 and he was intubated.  On 8/29 he met criteria for extubation, however, immediately after extubation he developed respiratory distress and required reintubation.  PCCM consulted for optimization of pulmonary issues.  Pertinent  Medical History   has a past medical history of AAA (abdominal aortic aneurysm) ([place]), Afib ([place]), Arthritis, BPH (benign prostatic hyperplasia), COPD (chronic obstructive pulmonary disease) ([place]), Coronary artery disease, Coronary artery ectasia, Depression, Diaphragmatic paralysis,  Dyspnea, Dysrhythmia, GERD (gastroesophageal reflux disease), Gout, HTN (hypertension), Hypercholesteremia, Hypertension, Iron deficiency anemia, Microhematuria, Obesity, OSA (obstructive sleep apnea), Paroxysmal atrial fibrillation ([place]), and PVC (premature ventricular contraction).   Significant Hospital Events: Including procedures, antibiotic start and stop dates in addition to other pertinent events   8/27 admit. CT placed. Intubated. 8/29 failed extubation.  8/31 chest tube removed and extubation attempted after 2-hour SBT at 5/5. 9/1 patient was extubated to BiPAP but unable  to wean from BiPAP  Interim History / Subjective:   On BiPAP this morning.  In no distress.  Family now requesting full transition to comfort care.  Objective   Blood pressure 132/79, pulse 97, temperature (!) 97.4 F (36.3 C), temperature source Oral, resp. rate 18, height 5' 10"  (1.778 m), weight 90.8 kg, SpO2 98 %.    Vent Mode: BIPAP;PCV FiO2 (%):  [30 %-60 %] 30 % Set Rate:  [12 bmp-16 bmp] 16 bmp Vt Set:  [580 mL] 580 mL PEEP:  [5 cmH20] 5 cmH20   Intake/Output Summary (Last 24 hours) at 2021-05-24 0846 Last data filed at 2021-05-24 0600 Gross per 24 hour  Intake 299.94 ml  Output 1655 ml  Net -1355.06 ml    Filed Weights   05/08/21 0500 05/09/21 0500 05/24/2021 0500  Weight: 85.9 kg 87.5 kg 90.8 kg    Examination: General: Elderly man resting comfortably on BiPAP in no distress.  HENT: [place]/AT, eyes anicteric.  Lungs: Chest is clear bilaterally.  Chest tube site intact.  Breath sounds are equal. Cardiovascular: S1S2, RRR Abdomen: soft, NT Extremities:Miild hand and foot edema, no cyanosis.  Derm: Bruising on arms. No rashes. Neuro: RASS -5, pinpoint pupils, no tracheal cough when suctioned.Marland Kitchen    Resolved  Hospital Problem list     Assessment & Plan:   Acute on chronic hypoxemic and hypercarbic respiratory failure COPD without acute exacerbation OSA on BiPAP Presumed NM weakness, but no  formal sniff testing done to confirm this. Left sided pneumothorax, traumatic Deconditioning Acute anemia, likely due to critical illness, some bleeding from fall and pneumothorax  Plan:   -Does not appear to have tolerated extubation.  Even small amount of pressure support was likely a significant respiratory crutch for this patient with impaired respiratory muscle function.  He has been declining over the last several months with worsening quality of life.  At this point, his family has made the reasonable decision to transition him to comfort care. -Comfort care order set initiated.  We will start morphine infusion and wean off BiPAP and titrate to relief of dyspnea.  Best Practice (right click and "Reselect all SmartList Selections" daily)   Diet/type: tubefeeds -  on hold for OR this afternoon DVT prophylaxis: LMWH GI prophylaxis: PPI Lines: N/A Foley:  Yes, and it is still needed Code Status:  DNR Last date of multidisciplinary goals of care discussion [ 05/07/21]  Labs   CBC: Recent Labs  Lab 05/06/21 0430 05/06/21 0848 05/07/21 0619 05/07/21 1357 05/08/21 0610 05/09/21 0504 Jun 02, 2021 0057  WBC 10.2  --  13.6*  --  12.9* 10.7* 12.2*  NEUTROABS 8.2*  --   --   --   --   --   --   HGB 11.5*   < > 11.9* 12.2* 12.8* 11.4* 12.4*  HCT 34.9*   < > 35.2* 36.0* 38.6* 33.6* 39.3  MCV 97.2  --  94.9  --  97.7 96.3 98.7  PLT 143*  --  162  --  162 175 210   < > = values in this interval not displayed.     Basic Metabolic Panel: Recent Labs  Lab 05/06/21 0430 05/06/21 0848 05/06/21 1756 05/07/21 0023 05/07/21 1610 05/07/21 1357 05/07/21 1615 05/08/21 0503 05/09/21 0504 06/02/21 0057  NA 134*   < > 133*   < > 134* 136  --  134* 137 136  K 3.5   < > 3.0*   < > 4.7 4.3  --  4.3 3.2* 5.0  CL 88*  --  92*  --  97*  --   --  96* 95* 97*  CO2 29  --  29  --  28  --   --  23 32 29  GLUCOSE 64*  --  96  --  114*  --   --  189* 137* 94  BUN 19  --  23  --  23  --   --  28*  40* 45*  CREATININE 1.28*  --  1.21  --  1.16  --   --  1.10 1.22 1.05  CALCIUM 9.1  --  8.6*  --  8.4*  --   --  8.2* 7.8* 8.1*  MG 1.3*  --  1.8  --  2.2  --  2.2 1.9  --   --   PHOS  --   --  1.2*  --  4.8*  --  5.6* 5.5*  --   --    < > = values in this interval not displayed.    35 minutes spent with greater than 50% of time in counseling and coordination of care.  Kipp Brood, MD [facility] ICU Physician [place]  Pager: [phone] Mobile: [phone] After hours: [phone].

## 2021-06-09 NOTE — Progress Notes (Signed)
This chaplain is present with the Pt. and family for EOL spiritual care.  The chaplain provided companionship for the family as the Pt. passed.   The chaplain understands the Pt. clergy prayed earlier today with the Pt. and family.  The family is finding comfort in knowing the Pt. has found peace in eternal life.  This chaplain will share intercessory prayer and F/U spiritual care as needed.

## 2021-06-09 DEATH — deceased

## 2021-08-10 ENCOUNTER — Ambulatory Visit: Payer: Medicare Other | Admitting: Physician Assistant

## 2021-10-17 ENCOUNTER — Ambulatory Visit: Payer: Medicare Other | Admitting: Neurology

## 2022-04-05 IMAGING — CT CT CERVICAL SPINE W/O CM
3 of 4 series · 13 of 33 positions shown, 16 images · non-contrast
Comparison: CT head dated January 05, 2019.

CLINICAL DATA: Fall.

EXAM:
CT HEAD WITHOUT CONTRAST
CT CERVICAL SPINE WITHOUT CONTRAST
TECHNIQUE: Multidetector CT imaging of the head and cervical spine was
performed following the standard protocol without intravenous
contrast. Multiplanar CT image reconstructions of the cervical spine
were also generated.

[Series 8: sag bone · sagittal · 0.36mm/px · 5 of 77 slices shown, 6 images]
[im 26/77  bone]
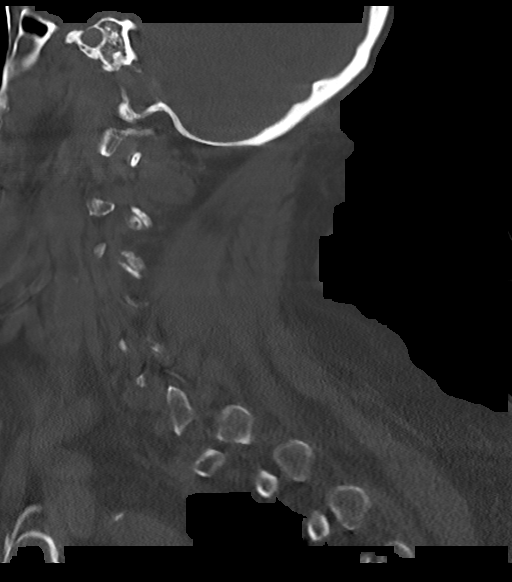
[im 32/77  bone]
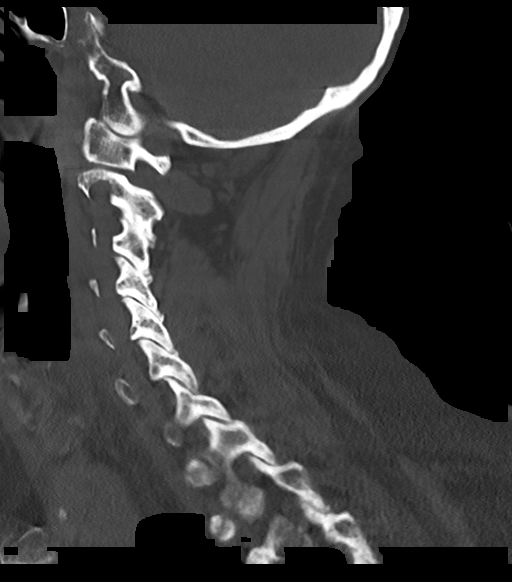
[im 39/77  soft-tissue]
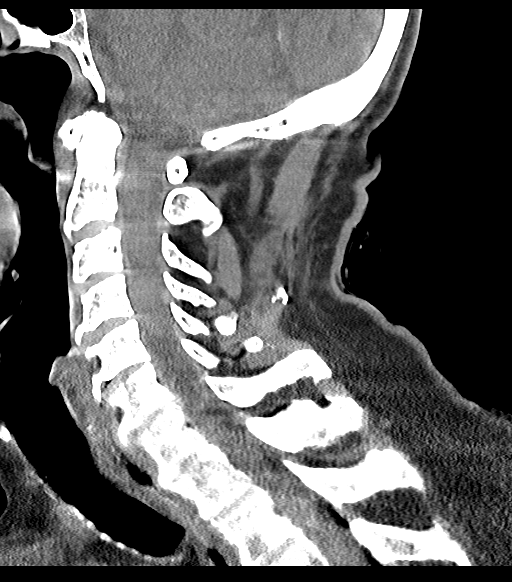
[im 39/77  bone]
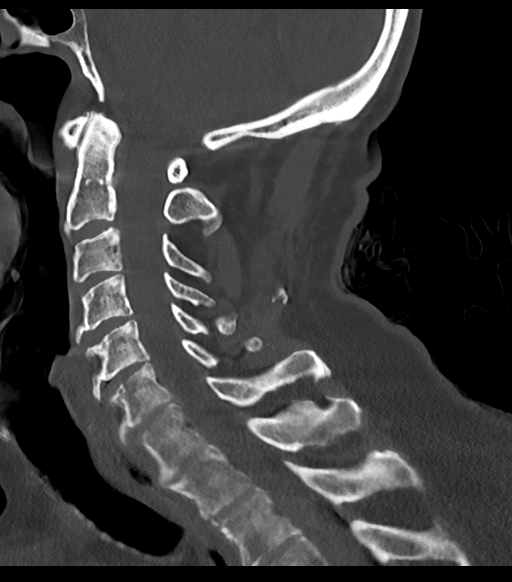
[im 45/77  bone]
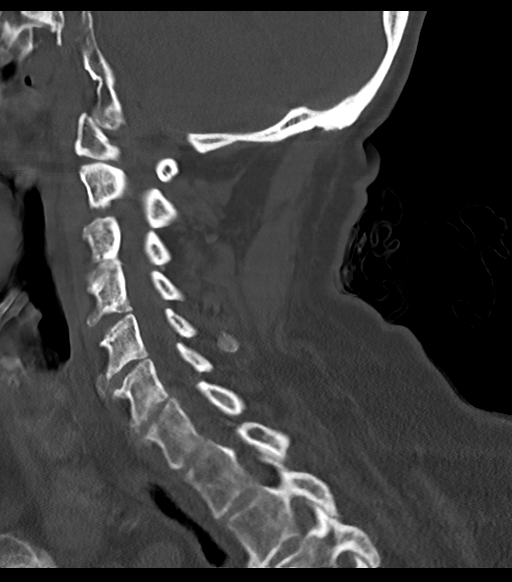
[im 51/77  bone]
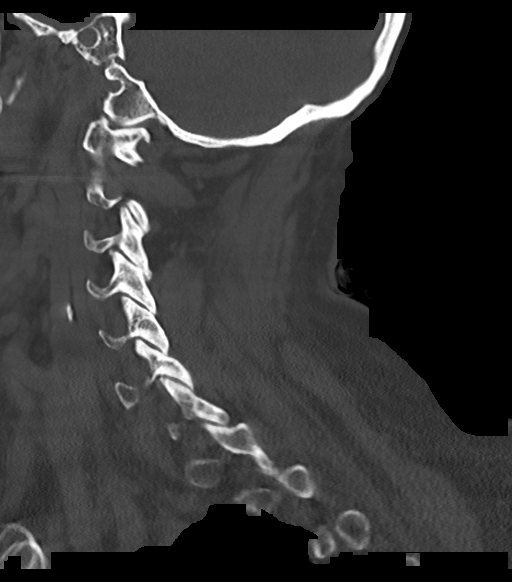

[Series 9: cor bone · coronal · 0.35mm/px · 3 of 88 slices shown]
[im 18/88  bone]
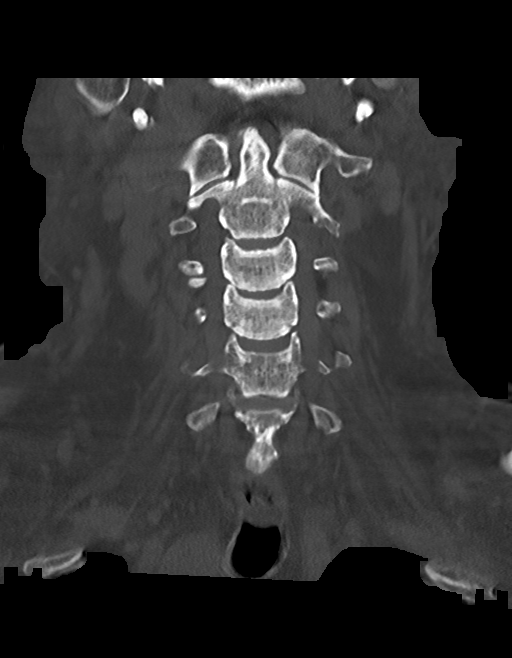
[im 35/88  bone]
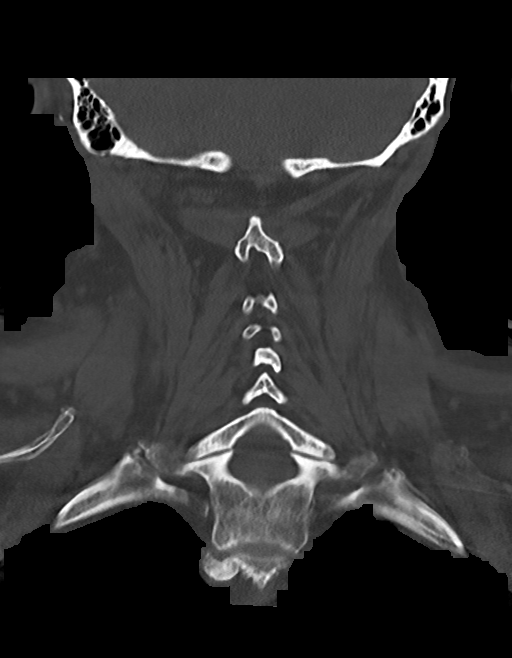
[im 53/88  bone]
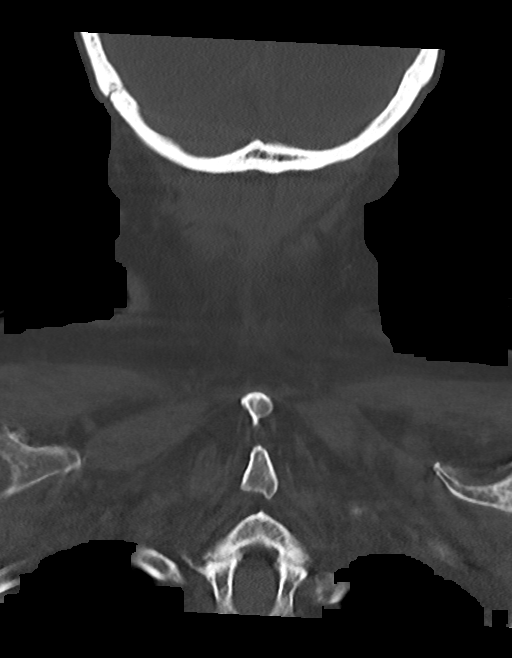

[Series 10: orthogonal axials · oblique · 0.21mm/px · 5 of 97 slices shown, 7 images]
[im 17/97  soft-tissue]
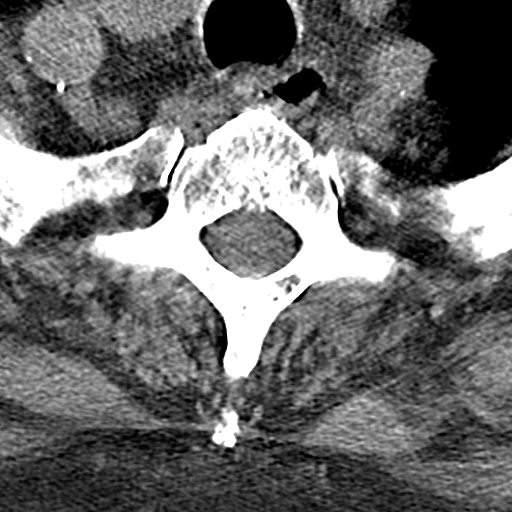
[im 17/97  bone]
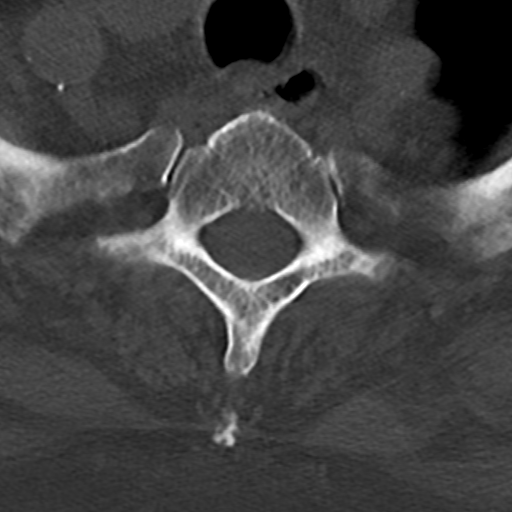
[im 33/97  bone]
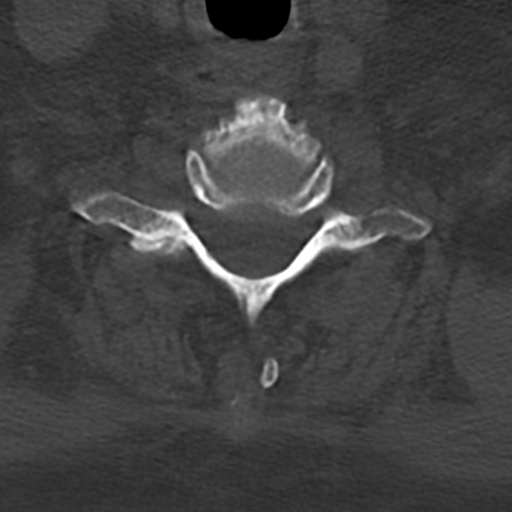
[im 49/97  bone]
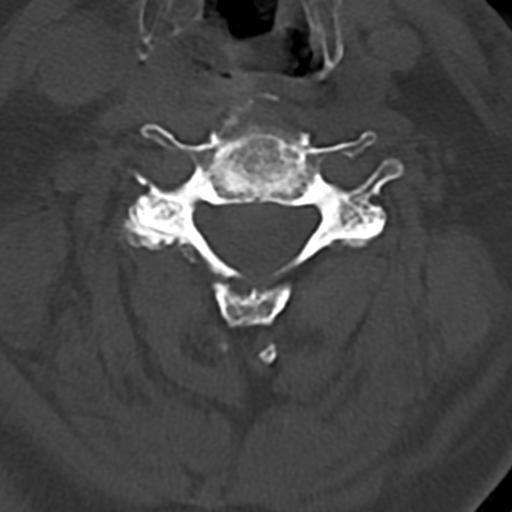
[im 65/97  bone]
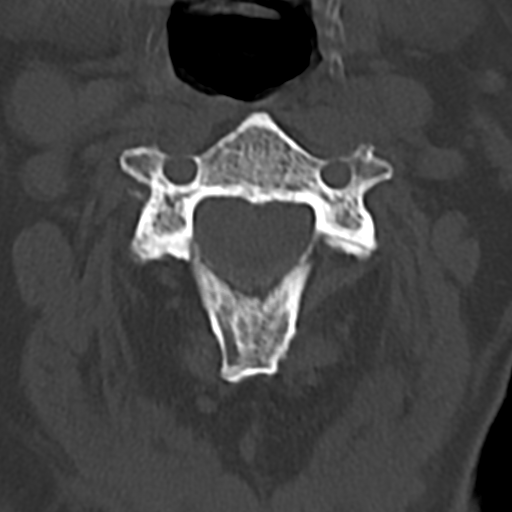
[im 81/97  soft-tissue]
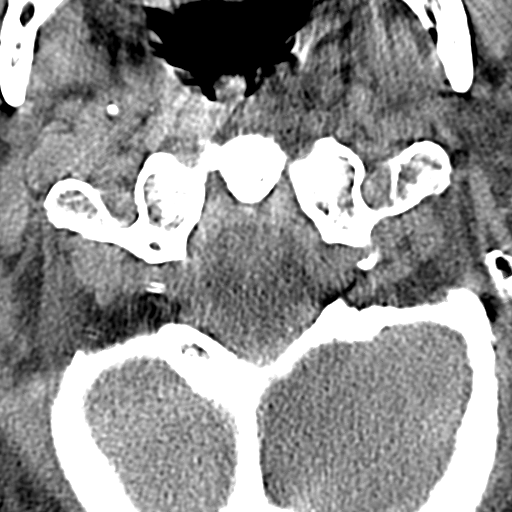
[im 81/97  bone]
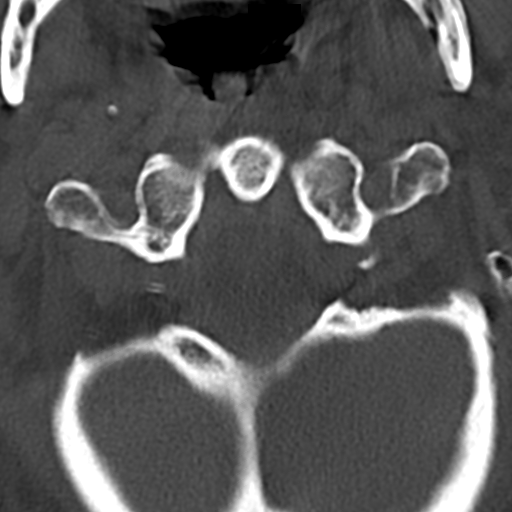

[13 of 33 positions shown; findings below may reference images not displayed]

FINDINGS: CT HEAD FINDINGS

Brain: High left-sided parafalcine subdural hematoma measuring up to
5 mm in maximal thickness (series 3, image 29). This tracks
inferiorly along the posterior falx and tentorium (series 3, image
13). No evidence of acute infarction, intraparenchymal hemorrhage,
hydrocephalus, or mass lesion/mass effect. Stable atrophy.

Vascular: Calcified atherosclerosis at the skullbase. No hyperdense
vessel. No hyperdense vessel.

Skull: Normal. Negative for fracture or focal lesion.

Sinuses/Orbits: No acute finding.

Other: Small posterior scalp hematoma.

CT CERVICAL SPINE FINDINGS

Alignment: Normal.

Skull base and vertebrae: No acute fracture. No primary bone lesion
or focal pathologic process.

Soft tissues and spinal canal: No prevertebral fluid or swelling. No
visible canal hematoma.

Disc levels: Mild disc height loss at C5-C6 and C6-C7. Relatively
diffuse right greater than left facet arthropathy.

Upper chest: Negative.

Other: None.
IMPRESSION: 1. High left-sided parafalcine subdural hematoma measuring up to 5
mm in maximal thickness, tracking inferiorly along the posterior
falx and tentorium. No mass effect.
2. Small posterior scalp hematoma.
3. No acute cervical spine fracture or traumatic listhesis.

Critical Value/emergent results were called by telephone at the time
of interpretation on 08/15/2020 at [DATE] to provider JHEICK OZUNA ,
who verbally acknowledged these results.

## 2022-04-05 IMAGING — CT CT HEAD W/O CM
4 series · 16 of 47 positions shown, 18 images · non-contrast
Comparison: CT head dated January 05, 2019.

CLINICAL DATA: Fall.

EXAM:
CT HEAD WITHOUT CONTRAST
CT CERVICAL SPINE WITHOUT CONTRAST
TECHNIQUE: Multidetector CT imaging of the head and cervical spine was
performed following the standard protocol without intravenous
contrast. Multiplanar CT image reconstructions of the cervical spine
were also generated.

[Series 3: head wo · axial · 0.45mm/px · z∈[-118,+2]mm · 7 of 34 slices shown, 9 images]
[im 5/34  brain]
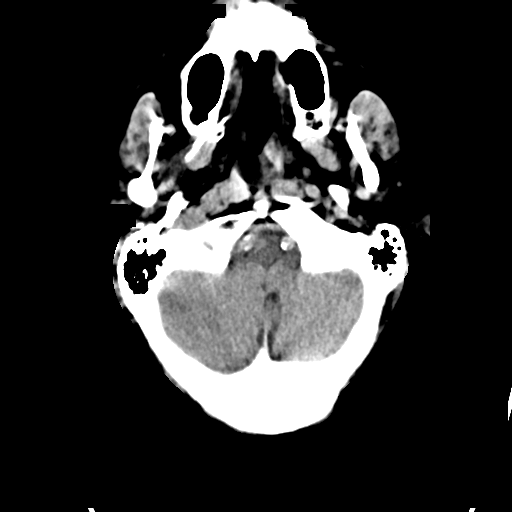
[im 5/34  bone]
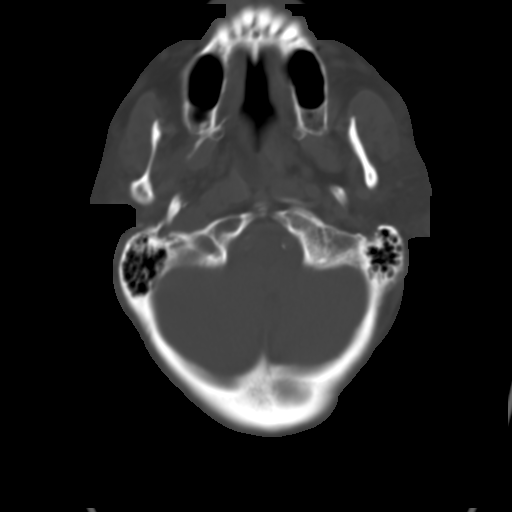
[im 9/34  brain]
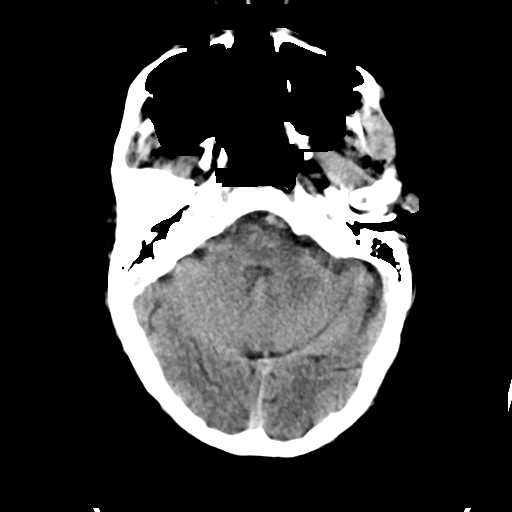
[im 13/34  brain]
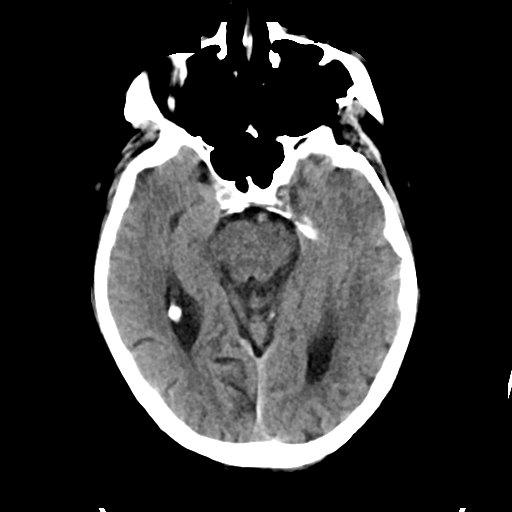
[im 17/34  brain]
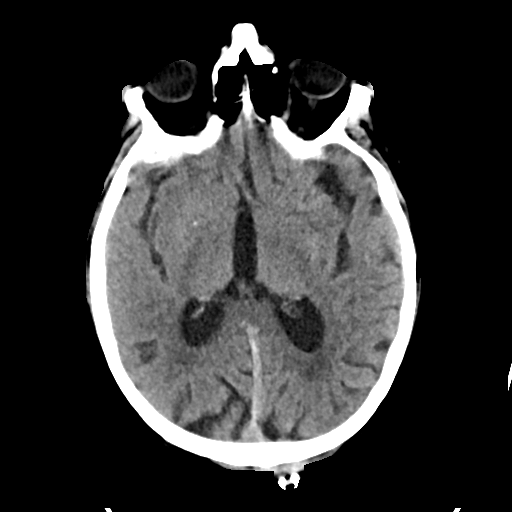
[im 21/34  brain]
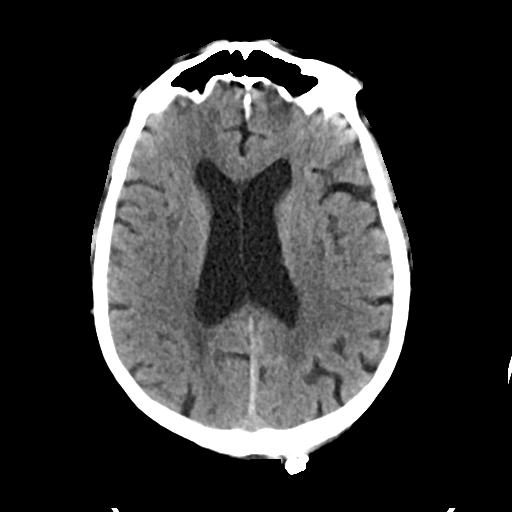
[im 21/34  bone]
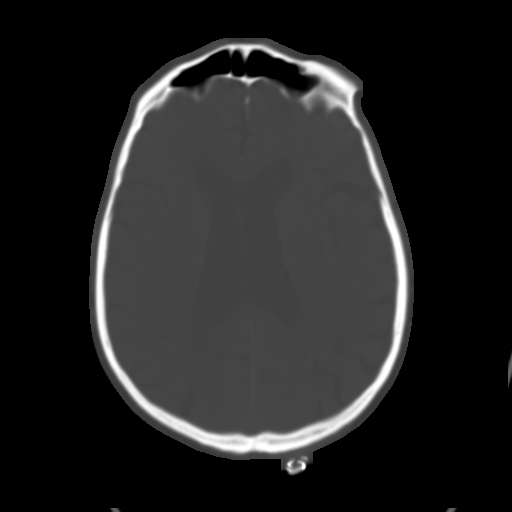
[im 25/34  brain]
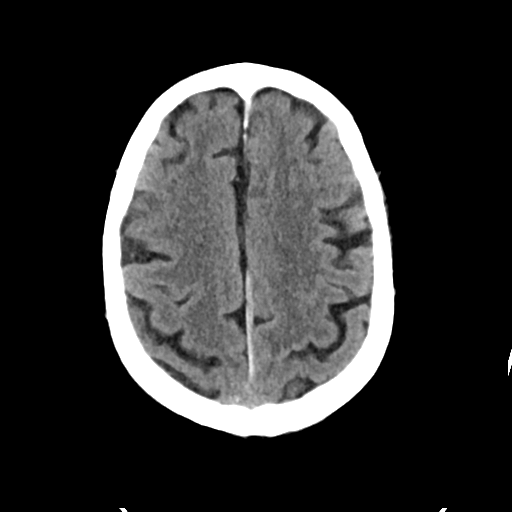
[im 29/34  brain]
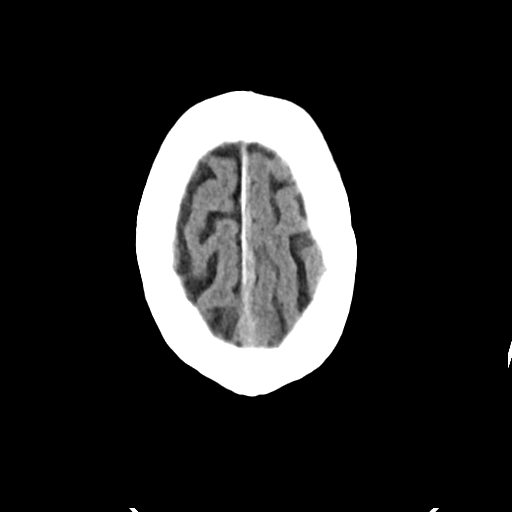

[Series 4: head bone · axial · 0.45mm/px · z∈[-122,-90]mm · 3 of 84 slices shown]
[im 9/84  bone]
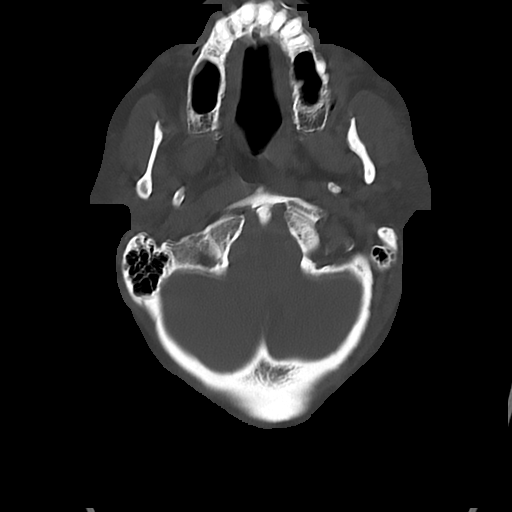
[im 17/84  bone]
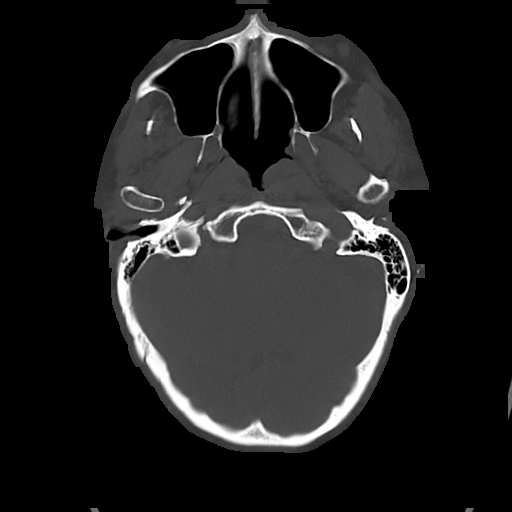
[im 25/84  bone]
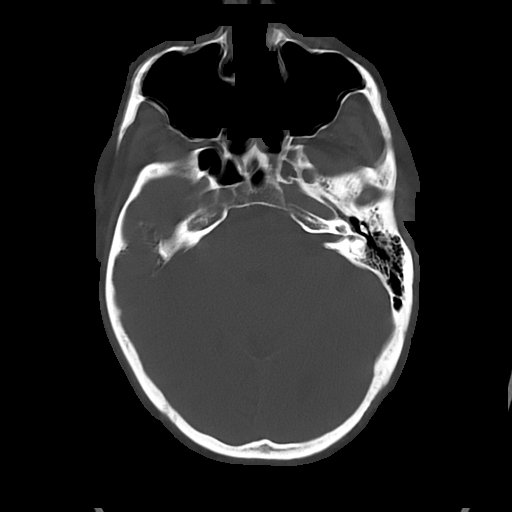

[Series 5: cor soft · coronal · 0.35mm/px · 3 of 86 slices shown]
[im 31/86  brain]
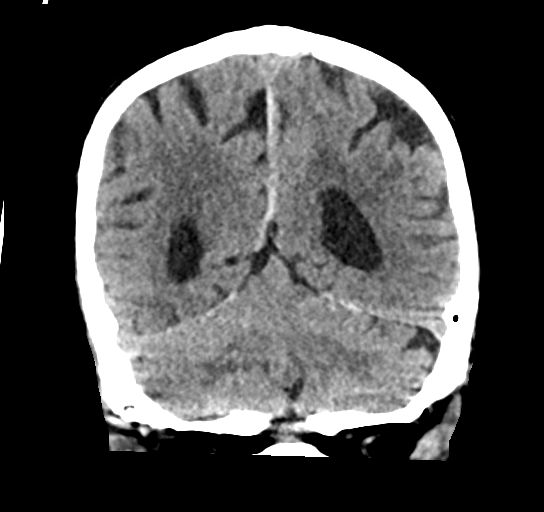
[im 39/86  brain]
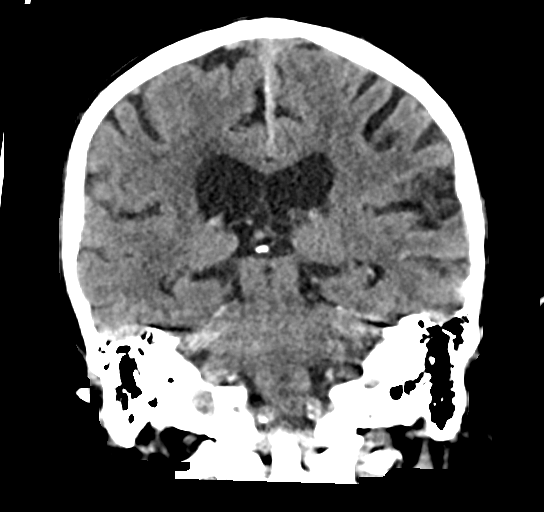
[im 47/86  brain]
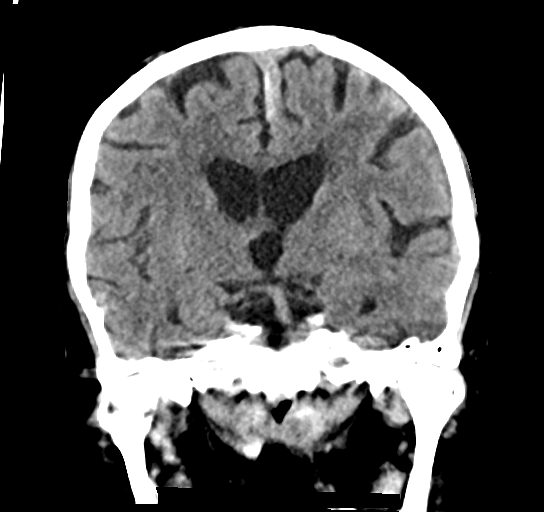

[Series 6: sag soft · sagittal · 0.39mm/px · 3 of 63 slices shown]
[im 21/63  brain]
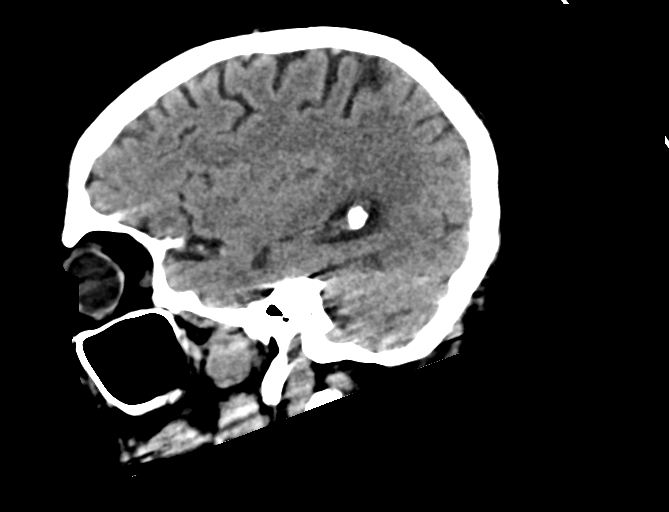
[im 32/63  brain]
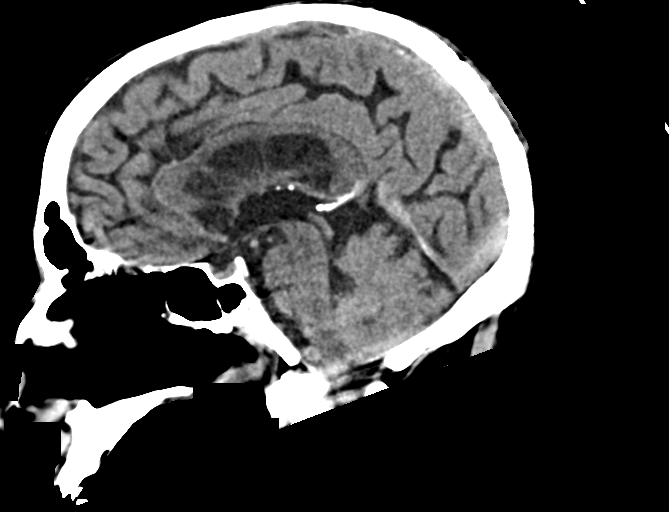
[im 42/63  brain]
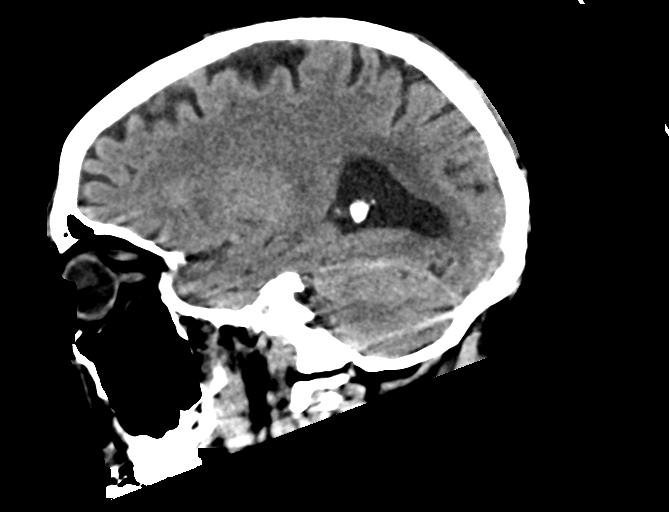

[16 of 47 positions shown; findings below may reference images not displayed]

FINDINGS: CT HEAD FINDINGS

Brain: High left-sided parafalcine subdural hematoma measuring up to
5 mm in maximal thickness (series 3, image 29). This tracks
inferiorly along the posterior falx and tentorium (series 3, image
13). No evidence of acute infarction, intraparenchymal hemorrhage,
hydrocephalus, or mass lesion/mass effect. Stable atrophy.

Vascular: Calcified atherosclerosis at the skullbase. No hyperdense
vessel. No hyperdense vessel.

Skull: Normal. Negative for fracture or focal lesion.

Sinuses/Orbits: No acute finding.

Other: Small posterior scalp hematoma.

CT CERVICAL SPINE FINDINGS

Alignment: Normal.

Skull base and vertebrae: No acute fracture. No primary bone lesion
or focal pathologic process.

Soft tissues and spinal canal: No prevertebral fluid or swelling. No
visible canal hematoma.

Disc levels: Mild disc height loss at C5-C6 and C6-C7. Relatively
diffuse right greater than left facet arthropathy.

Upper chest: Negative.

Other: None.
IMPRESSION: 1. High left-sided parafalcine subdural hematoma measuring up to 5
mm in maximal thickness, tracking inferiorly along the posterior
falx and tentorium. No mass effect.
2. Small posterior scalp hematoma.
3. No acute cervical spine fracture or traumatic listhesis.

Critical Value/emergent results were called by telephone at the time
of interpretation on 08/15/2020 at [DATE] to provider JHEICK OZUNA ,
who verbally acknowledged these results.
